# Patient Record
Sex: Female | Born: 1935 | Race: White | Hispanic: No | Smoking: Never smoker
Health system: Southern US, Community
[De-identification: ages and names within clinical notes are randomized; demographics above are authoritative.]

## PROBLEM LIST (undated history)

## (undated) DIAGNOSIS — I619 Nontraumatic intracerebral hemorrhage, unspecified: Secondary | ICD-10-CM

## (undated) DIAGNOSIS — Z87898 Personal history of other specified conditions: Secondary | ICD-10-CM

## (undated) DIAGNOSIS — I472 Ventricular tachycardia, unspecified: Secondary | ICD-10-CM

## (undated) DIAGNOSIS — Z8619 Personal history of other infectious and parasitic diseases: Secondary | ICD-10-CM

## (undated) DIAGNOSIS — Z91013 Allergy to seafood: Secondary | ICD-10-CM

## (undated) DIAGNOSIS — I5022 Chronic systolic (congestive) heart failure: Secondary | ICD-10-CM

## (undated) DIAGNOSIS — M4800 Spinal stenosis, site unspecified: Secondary | ICD-10-CM

## (undated) DIAGNOSIS — Z8679 Personal history of other diseases of the circulatory system: Secondary | ICD-10-CM

## (undated) DIAGNOSIS — I10 Essential (primary) hypertension: Secondary | ICD-10-CM

## (undated) DIAGNOSIS — S62101A Fracture of unspecified carpal bone, right wrist, initial encounter for closed fracture: Secondary | ICD-10-CM

## (undated) DIAGNOSIS — M1990 Unspecified osteoarthritis, unspecified site: Secondary | ICD-10-CM

## (undated) DIAGNOSIS — K279 Peptic ulcer, site unspecified, unspecified as acute or chronic, without hemorrhage or perforation: Secondary | ICD-10-CM

## (undated) DIAGNOSIS — G4762 Sleep related leg cramps: Secondary | ICD-10-CM

## (undated) DIAGNOSIS — F32A Depression, unspecified: Secondary | ICD-10-CM

## (undated) DIAGNOSIS — S329XXA Fracture of unspecified parts of lumbosacral spine and pelvis, initial encounter for closed fracture: Secondary | ICD-10-CM

## (undated) DIAGNOSIS — N289 Disorder of kidney and ureter, unspecified: Secondary | ICD-10-CM

## (undated) DIAGNOSIS — G8929 Other chronic pain: Secondary | ICD-10-CM

## (undated) DIAGNOSIS — S92909A Unspecified fracture of unspecified foot, initial encounter for closed fracture: Secondary | ICD-10-CM

## (undated) DIAGNOSIS — M810 Age-related osteoporosis without current pathological fracture: Secondary | ICD-10-CM

## (undated) DIAGNOSIS — F329 Major depressive disorder, single episode, unspecified: Secondary | ICD-10-CM

## (undated) DIAGNOSIS — T7840XA Allergy, unspecified, initial encounter: Secondary | ICD-10-CM

## (undated) DIAGNOSIS — M549 Dorsalgia, unspecified: Secondary | ICD-10-CM

## (undated) HISTORY — DX: Essential (primary) hypertension: I10

## (undated) HISTORY — DX: Fracture of unspecified parts of lumbosacral spine and pelvis, initial encounter for closed fracture: S32.9XXA

## (undated) HISTORY — PX: BREAST LUMPECTOMY: SHX2

## (undated) HISTORY — DX: Major depressive disorder, single episode, unspecified: F32.9

## (undated) HISTORY — DX: Sleep related leg cramps: G47.62

## (undated) HISTORY — DX: Peptic ulcer, site unspecified, unspecified as acute or chronic, without hemorrhage or perforation: K27.9

## (undated) HISTORY — PX: BACK SURGERY: SHX140

## (undated) HISTORY — DX: Allergy, unspecified, initial encounter: T78.40XA

## (undated) HISTORY — DX: Unspecified fracture of unspecified foot, initial encounter for closed fracture: S92.909A

## (undated) HISTORY — DX: Personal history of other specified conditions: Z87.898

## (undated) HISTORY — PX: WISDOM TOOTH EXTRACTION: SHX21

## (undated) HISTORY — DX: Unspecified osteoarthritis, unspecified site: M19.90

## (undated) HISTORY — DX: Depression, unspecified: F32.A

## (undated) HISTORY — DX: Personal history of other infectious and parasitic diseases: Z86.19

## (undated) HISTORY — DX: Allergy to seafood: Z91.013

## (undated) HISTORY — DX: Fracture of unspecified carpal bone, right wrist, initial encounter for closed fracture: S62.101A

## (undated) HISTORY — DX: Personal history of other diseases of the circulatory system: Z86.79

## (undated) HISTORY — DX: Age-related osteoporosis without current pathological fracture: M81.0

## (undated) HISTORY — DX: Spinal stenosis, site unspecified: M48.00

---

## 1997-11-20 DIAGNOSIS — Z8679 Personal history of other diseases of the circulatory system: Secondary | ICD-10-CM

## 1997-11-20 HISTORY — DX: Personal history of other diseases of the circulatory system: Z86.79

## 2004-08-05 ENCOUNTER — Ambulatory Visit: Payer: Self-pay

## 2004-11-07 ENCOUNTER — Emergency Department: Payer: Self-pay | Admitting: Internal Medicine

## 2005-05-07 ENCOUNTER — Ambulatory Visit: Payer: Self-pay | Admitting: Internal Medicine

## 2006-05-27 ENCOUNTER — Ambulatory Visit: Payer: Self-pay | Admitting: Internal Medicine

## 2006-12-04 ENCOUNTER — Inpatient Hospital Stay: Payer: Self-pay | Admitting: Cardiology

## 2006-12-17 ENCOUNTER — Ambulatory Visit: Payer: Self-pay | Admitting: Internal Medicine

## 2006-12-18 ENCOUNTER — Ambulatory Visit: Payer: Self-pay | Admitting: Gastroenterology

## 2006-12-24 ENCOUNTER — Ambulatory Visit: Payer: Self-pay | Admitting: Gastroenterology

## 2007-02-02 ENCOUNTER — Ambulatory Visit: Payer: Self-pay | Admitting: Pain Medicine

## 2007-02-08 ENCOUNTER — Ambulatory Visit: Payer: Self-pay | Admitting: Pain Medicine

## 2007-03-11 ENCOUNTER — Ambulatory Visit: Payer: Self-pay | Admitting: Pain Medicine

## 2007-04-13 ENCOUNTER — Ambulatory Visit: Payer: Self-pay | Admitting: Pain Medicine

## 2007-04-19 ENCOUNTER — Ambulatory Visit: Payer: Self-pay | Admitting: Pain Medicine

## 2007-05-27 ENCOUNTER — Ambulatory Visit: Payer: Self-pay | Admitting: Pain Medicine

## 2007-05-31 ENCOUNTER — Ambulatory Visit: Payer: Self-pay | Admitting: Internal Medicine

## 2007-06-21 ENCOUNTER — Ambulatory Visit: Payer: Self-pay | Admitting: Pain Medicine

## 2007-07-29 ENCOUNTER — Ambulatory Visit: Payer: Self-pay | Admitting: Pain Medicine

## 2007-08-02 ENCOUNTER — Ambulatory Visit: Payer: Self-pay | Admitting: Pain Medicine

## 2007-09-07 ENCOUNTER — Ambulatory Visit: Payer: Self-pay | Admitting: Unknown Physician Specialty

## 2007-09-14 ENCOUNTER — Ambulatory Visit: Payer: Self-pay | Admitting: Pain Medicine

## 2007-09-27 ENCOUNTER — Ambulatory Visit: Payer: Self-pay | Admitting: Pain Medicine

## 2007-10-09 ENCOUNTER — Ambulatory Visit: Payer: Self-pay | Admitting: Pain Medicine

## 2007-10-13 ENCOUNTER — Ambulatory Visit: Payer: Self-pay | Admitting: Pain Medicine

## 2007-11-23 ENCOUNTER — Ambulatory Visit: Payer: Self-pay | Admitting: Pain Medicine

## 2008-01-04 ENCOUNTER — Ambulatory Visit: Payer: Self-pay | Admitting: Pain Medicine

## 2008-02-29 ENCOUNTER — Ambulatory Visit: Payer: Self-pay | Admitting: Pain Medicine

## 2008-03-06 ENCOUNTER — Ambulatory Visit: Payer: Self-pay | Admitting: Pain Medicine

## 2008-04-04 ENCOUNTER — Ambulatory Visit: Payer: Self-pay | Admitting: Pain Medicine

## 2008-04-12 ENCOUNTER — Ambulatory Visit: Payer: Self-pay | Admitting: Pain Medicine

## 2008-05-04 ENCOUNTER — Ambulatory Visit: Payer: Self-pay | Admitting: Pain Medicine

## 2008-05-31 ENCOUNTER — Ambulatory Visit: Payer: Self-pay | Admitting: Internal Medicine

## 2008-06-06 ENCOUNTER — Ambulatory Visit: Payer: Self-pay | Admitting: Pain Medicine

## 2008-07-03 ENCOUNTER — Ambulatory Visit: Payer: Self-pay | Admitting: Pain Medicine

## 2008-07-20 ENCOUNTER — Ambulatory Visit: Payer: Self-pay | Admitting: Ophthalmology

## 2008-07-25 ENCOUNTER — Ambulatory Visit: Payer: Self-pay | Admitting: Pain Medicine

## 2008-07-31 ENCOUNTER — Ambulatory Visit: Payer: Self-pay | Admitting: Ophthalmology

## 2008-08-30 ENCOUNTER — Ambulatory Visit: Payer: Self-pay | Admitting: Pain Medicine

## 2008-09-28 ENCOUNTER — Ambulatory Visit: Payer: Self-pay | Admitting: Pain Medicine

## 2008-10-02 ENCOUNTER — Ambulatory Visit: Payer: Self-pay | Admitting: Pain Medicine

## 2008-10-31 ENCOUNTER — Ambulatory Visit: Payer: Self-pay | Admitting: Pain Medicine

## 2008-11-06 ENCOUNTER — Ambulatory Visit: Payer: Self-pay | Admitting: Unknown Physician Specialty

## 2008-11-08 ENCOUNTER — Encounter: Payer: Self-pay | Admitting: Internal Medicine

## 2008-11-20 ENCOUNTER — Encounter: Payer: Self-pay | Admitting: Internal Medicine

## 2008-12-05 ENCOUNTER — Ambulatory Visit: Payer: Self-pay | Admitting: Pain Medicine

## 2008-12-13 ENCOUNTER — Ambulatory Visit: Payer: Self-pay | Admitting: Pain Medicine

## 2009-01-02 ENCOUNTER — Ambulatory Visit: Payer: Self-pay | Admitting: Pain Medicine

## 2009-02-06 ENCOUNTER — Ambulatory Visit: Payer: Self-pay | Admitting: Pain Medicine

## 2009-03-06 ENCOUNTER — Ambulatory Visit: Payer: Self-pay | Admitting: Pain Medicine

## 2009-03-14 ENCOUNTER — Ambulatory Visit: Payer: Self-pay | Admitting: Pain Medicine

## 2009-04-17 ENCOUNTER — Ambulatory Visit: Payer: Self-pay | Admitting: Pain Medicine

## 2009-04-25 ENCOUNTER — Ambulatory Visit: Payer: Self-pay | Admitting: Pain Medicine

## 2009-05-15 ENCOUNTER — Ambulatory Visit: Payer: Self-pay | Admitting: Pain Medicine

## 2009-06-04 ENCOUNTER — Ambulatory Visit: Payer: Self-pay | Admitting: Internal Medicine

## 2009-06-14 ENCOUNTER — Ambulatory Visit: Payer: Self-pay | Admitting: Pain Medicine

## 2009-06-27 ENCOUNTER — Ambulatory Visit: Payer: Self-pay | Admitting: Pain Medicine

## 2009-07-10 ENCOUNTER — Ambulatory Visit: Payer: Self-pay | Admitting: Pain Medicine

## 2009-07-30 ENCOUNTER — Ambulatory Visit: Payer: Self-pay | Admitting: Pain Medicine

## 2009-09-04 ENCOUNTER — Ambulatory Visit: Payer: Self-pay | Admitting: Pain Medicine

## 2009-10-18 ENCOUNTER — Ambulatory Visit: Payer: Self-pay | Admitting: Pain Medicine

## 2009-10-22 ENCOUNTER — Ambulatory Visit: Payer: Self-pay | Admitting: Pain Medicine

## 2009-11-20 ENCOUNTER — Ambulatory Visit: Payer: Self-pay | Admitting: Pain Medicine

## 2009-12-24 ENCOUNTER — Ambulatory Visit: Payer: Self-pay | Admitting: Pain Medicine

## 2010-01-24 ENCOUNTER — Ambulatory Visit: Payer: Self-pay | Admitting: Pain Medicine

## 2010-02-03 ENCOUNTER — Encounter: Admission: RE | Admit: 2010-02-03 | Discharge: 2010-02-03 | Payer: Self-pay | Admitting: Neurosurgery

## 2010-02-26 ENCOUNTER — Ambulatory Visit: Payer: Self-pay | Admitting: Pain Medicine

## 2010-02-28 ENCOUNTER — Inpatient Hospital Stay (HOSPITAL_COMMUNITY): Admission: RE | Admit: 2010-02-28 | Discharge: 2010-03-02 | Payer: Self-pay | Admitting: Neurosurgery

## 2010-03-28 ENCOUNTER — Ambulatory Visit: Payer: Self-pay | Admitting: Pain Medicine

## 2010-04-25 ENCOUNTER — Ambulatory Visit: Payer: Self-pay | Admitting: Pain Medicine

## 2010-04-29 ENCOUNTER — Ambulatory Visit: Payer: Self-pay | Admitting: Pain Medicine

## 2010-05-28 ENCOUNTER — Ambulatory Visit: Payer: Self-pay | Admitting: Pain Medicine

## 2010-06-05 ENCOUNTER — Ambulatory Visit: Payer: Self-pay | Admitting: Pain Medicine

## 2010-06-05 ENCOUNTER — Ambulatory Visit: Payer: Self-pay | Admitting: Internal Medicine

## 2010-06-07 ENCOUNTER — Ambulatory Visit: Payer: Self-pay | Admitting: Pain Medicine

## 2010-06-25 ENCOUNTER — Ambulatory Visit: Payer: Self-pay | Admitting: Pain Medicine

## 2010-07-25 ENCOUNTER — Ambulatory Visit: Payer: Self-pay | Admitting: Pain Medicine

## 2010-07-31 ENCOUNTER — Ambulatory Visit: Payer: Self-pay | Admitting: Pain Medicine

## 2010-08-22 ENCOUNTER — Ambulatory Visit: Payer: Self-pay | Admitting: Pain Medicine

## 2010-09-25 ENCOUNTER — Ambulatory Visit: Payer: Self-pay | Admitting: Pain Medicine

## 2010-10-24 ENCOUNTER — Ambulatory Visit: Payer: Self-pay | Admitting: Pain Medicine

## 2010-10-28 ENCOUNTER — Ambulatory Visit: Payer: Self-pay | Admitting: Pain Medicine

## 2010-11-26 ENCOUNTER — Ambulatory Visit: Payer: Self-pay | Admitting: Pain Medicine

## 2010-12-03 ENCOUNTER — Other Ambulatory Visit: Payer: Self-pay | Admitting: Neurosurgery

## 2010-12-03 DIAGNOSIS — M549 Dorsalgia, unspecified: Secondary | ICD-10-CM

## 2010-12-03 DIAGNOSIS — M479 Spondylosis, unspecified: Secondary | ICD-10-CM

## 2010-12-07 ENCOUNTER — Ambulatory Visit
Admission: RE | Admit: 2010-12-07 | Discharge: 2010-12-07 | Disposition: A | Discharge status: Home / Self Care | Payer: Medicare Other | Source: Ambulatory Visit | Attending: Neurosurgery | Admitting: Neurosurgery

## 2010-12-07 DIAGNOSIS — M479 Spondylosis, unspecified: Secondary | ICD-10-CM

## 2010-12-07 DIAGNOSIS — M549 Dorsalgia, unspecified: Secondary | ICD-10-CM

## 2010-12-09 LAB — COMPREHENSIVE METABOLIC PANEL
ALT: 17 U/L (ref 0–35)
CO2: 26 mEq/L (ref 19–32)
Calcium: 9 mg/dL (ref 8.4–10.5)
Creatinine, Ser: 1.48 mg/dL — ABNORMAL HIGH (ref 0.4–1.2)
Glucose, Bld: 84 mg/dL (ref 70–99)
Potassium: 4.1 mEq/L (ref 3.5–5.1)
Sodium: 135 mEq/L (ref 135–145)
Total Bilirubin: 0.4 mg/dL (ref 0.3–1.2)
Total Protein: 6.6 g/dL (ref 6.0–8.3)

## 2010-12-09 LAB — URINE MICROSCOPIC-ADD ON

## 2010-12-09 LAB — CBC
Platelets: 247 10*3/uL (ref 150–400)
RDW: 16.8 % — ABNORMAL HIGH (ref 11.5–15.5)

## 2010-12-09 LAB — URINALYSIS, ROUTINE W REFLEX MICROSCOPIC
Bilirubin Urine: NEGATIVE
Nitrite: NEGATIVE
Protein, ur: NEGATIVE mg/dL
Urobilinogen, UA: 0.2 mg/dL (ref 0.0–1.0)

## 2010-12-09 LAB — PROTIME-INR
INR: 1.05 (ref 0.00–1.49)
Prothrombin Time: 13.6 seconds (ref 11.6–15.2)

## 2010-12-24 ENCOUNTER — Ambulatory Visit: Payer: Self-pay | Admitting: Pain Medicine

## 2011-01-01 ENCOUNTER — Ambulatory Visit: Payer: Self-pay | Admitting: Pain Medicine

## 2011-01-28 ENCOUNTER — Ambulatory Visit: Payer: Self-pay | Admitting: Pain Medicine

## 2011-02-10 ENCOUNTER — Ambulatory Visit: Payer: Self-pay | Admitting: Pain Medicine

## 2011-03-12 ENCOUNTER — Ambulatory Visit: Payer: Self-pay | Admitting: Ophthalmology

## 2011-03-13 ENCOUNTER — Ambulatory Visit: Payer: Self-pay | Admitting: Pain Medicine

## 2011-03-24 ENCOUNTER — Ambulatory Visit: Payer: Self-pay | Admitting: Ophthalmology

## 2011-04-15 ENCOUNTER — Ambulatory Visit: Payer: Self-pay | Admitting: Pain Medicine

## 2011-05-20 ENCOUNTER — Ambulatory Visit: Payer: Self-pay | Admitting: Pain Medicine

## 2011-06-09 ENCOUNTER — Ambulatory Visit: Payer: Self-pay | Admitting: Pain Medicine

## 2011-06-26 ENCOUNTER — Ambulatory Visit: Payer: Self-pay | Admitting: Internal Medicine

## 2011-07-15 ENCOUNTER — Other Ambulatory Visit: Payer: Self-pay | Admitting: Neurosurgery

## 2011-07-15 DIAGNOSIS — M479 Spondylosis, unspecified: Secondary | ICD-10-CM

## 2011-07-17 ENCOUNTER — Ambulatory Visit: Payer: Self-pay | Admitting: Pain Medicine

## 2011-07-23 ENCOUNTER — Other Ambulatory Visit: Payer: Self-pay | Admitting: Neurosurgery

## 2011-07-23 ENCOUNTER — Ambulatory Visit
Admission: RE | Admit: 2011-07-23 | Discharge: 2011-07-23 | Disposition: A | Discharge status: Home / Self Care | Payer: Medicare Other | Source: Ambulatory Visit | Attending: Neurosurgery | Admitting: Neurosurgery

## 2011-07-23 DIAGNOSIS — M479 Spondylosis, unspecified: Secondary | ICD-10-CM

## 2011-08-27 ENCOUNTER — Ambulatory Visit: Payer: Self-pay | Admitting: Pain Medicine

## 2011-09-25 ENCOUNTER — Ambulatory Visit: Payer: Self-pay | Admitting: Pain Medicine

## 2011-10-06 ENCOUNTER — Ambulatory Visit: Payer: Self-pay | Admitting: Pain Medicine

## 2011-10-23 ENCOUNTER — Ambulatory Visit: Payer: Self-pay | Admitting: Pain Medicine

## 2011-11-26 ENCOUNTER — Ambulatory Visit: Payer: Self-pay | Admitting: Pain Medicine

## 2011-12-30 ENCOUNTER — Ambulatory Visit: Payer: Self-pay | Admitting: Pain Medicine

## 2012-01-12 ENCOUNTER — Ambulatory Visit: Payer: Self-pay | Admitting: Pain Medicine

## 2012-02-10 ENCOUNTER — Ambulatory Visit: Payer: Self-pay | Admitting: Pain Medicine

## 2012-02-25 ENCOUNTER — Ambulatory Visit: Payer: Self-pay | Admitting: Pain Medicine

## 2012-03-30 ENCOUNTER — Ambulatory Visit: Payer: Self-pay | Admitting: Pain Medicine

## 2012-04-27 ENCOUNTER — Ambulatory Visit: Payer: Self-pay | Admitting: Pain Medicine

## 2012-05-21 ENCOUNTER — Emergency Department: Payer: Self-pay | Admitting: Emergency Medicine

## 2012-05-21 LAB — URINALYSIS, COMPLETE
Bilirubin,UR: NEGATIVE
Hyaline Cast: 6
Ketone: NEGATIVE
Ph: 5 (ref 4.5–8.0)
Protein: 30
RBC,UR: NONE SEEN /HPF (ref 0–5)
Specific Gravity: 1.024 (ref 1.003–1.030)
WBC UR: 472 /HPF (ref 0–5)

## 2012-05-21 LAB — COMPREHENSIVE METABOLIC PANEL
Anion Gap: 10 (ref 7–16)
Bilirubin,Total: 0.6 mg/dL (ref 0.2–1.0)
Chloride: 101 mmol/L (ref 98–107)
Co2: 26 mmol/L (ref 21–32)
EGFR (African American): 39 — ABNORMAL LOW
Osmolality: 281 (ref 275–301)
Potassium: 3.8 mmol/L (ref 3.5–5.1)
Sodium: 137 mmol/L (ref 136–145)
Total Protein: 8 g/dL (ref 6.4–8.2)

## 2012-05-21 LAB — TROPONIN I: Troponin-I: <0.02 ng/mL

## 2012-05-21 LAB — CBC
HGB: 10.8 g/dL — ABNORMAL LOW (ref 12.0–16.0)
MCH: 33.2 pg (ref 26.0–34.0)
MCHC: 33.6 g/dL (ref 32.0–36.0)
RBC: 3.26 10*6/uL — ABNORMAL LOW (ref 3.80–5.20)
RDW: 15.7 % — ABNORMAL HIGH (ref 11.5–14.5)
WBC: 13.5 10*3/uL — ABNORMAL HIGH (ref 3.6–11.0)

## 2012-06-10 ENCOUNTER — Ambulatory Visit: Payer: Self-pay | Admitting: Pain Medicine

## 2012-06-14 ENCOUNTER — Ambulatory Visit: Payer: Self-pay | Admitting: Pain Medicine

## 2012-06-29 ENCOUNTER — Ambulatory Visit: Payer: Self-pay | Admitting: Internal Medicine

## 2012-07-07 ENCOUNTER — Encounter: Payer: Self-pay | Admitting: Neurology

## 2012-07-08 ENCOUNTER — Ambulatory Visit: Payer: Self-pay | Admitting: Pain Medicine

## 2012-07-23 ENCOUNTER — Encounter: Payer: Self-pay | Admitting: Neurology

## 2012-08-10 ENCOUNTER — Ambulatory Visit: Payer: Self-pay | Admitting: Pain Medicine

## 2012-08-11 ENCOUNTER — Ambulatory Visit: Payer: Self-pay | Admitting: Pain Medicine

## 2012-09-07 ENCOUNTER — Ambulatory Visit: Payer: Self-pay | Admitting: Pain Medicine

## 2012-09-24 ENCOUNTER — Ambulatory Visit: Payer: Self-pay | Admitting: Pain Medicine

## 2012-09-27 ENCOUNTER — Ambulatory Visit: Payer: Self-pay | Admitting: Pain Medicine

## 2012-10-14 ENCOUNTER — Ambulatory Visit: Payer: Self-pay | Admitting: Pain Medicine

## 2012-11-16 ENCOUNTER — Ambulatory Visit: Payer: Self-pay | Admitting: Pain Medicine

## 2012-12-14 ENCOUNTER — Ambulatory Visit: Payer: Self-pay | Admitting: Pain Medicine

## 2013-01-05 ENCOUNTER — Ambulatory Visit: Payer: Self-pay | Admitting: Pain Medicine

## 2013-02-08 ENCOUNTER — Ambulatory Visit: Payer: Self-pay | Admitting: Pain Medicine

## 2013-03-21 ENCOUNTER — Ambulatory Visit: Payer: Self-pay | Admitting: Pain Medicine

## 2013-04-18 ENCOUNTER — Ambulatory Visit: Payer: Self-pay | Admitting: Pain Medicine

## 2013-05-17 ENCOUNTER — Ambulatory Visit: Payer: Self-pay | Admitting: Pain Medicine

## 2013-05-25 ENCOUNTER — Ambulatory Visit: Payer: Self-pay | Admitting: Pain Medicine

## 2013-06-16 ENCOUNTER — Ambulatory Visit: Payer: Self-pay | Admitting: Pain Medicine

## 2013-07-19 ENCOUNTER — Ambulatory Visit: Payer: Self-pay | Admitting: Pain Medicine

## 2013-08-23 ENCOUNTER — Ambulatory Visit: Payer: Self-pay | Admitting: Orthopedic Surgery

## 2013-08-25 ENCOUNTER — Ambulatory Visit: Payer: Self-pay | Admitting: Orthopedic Surgery

## 2013-08-25 HISTORY — PX: FRACTURE SURGERY: SHX138

## 2013-09-07 ENCOUNTER — Ambulatory Visit: Payer: Self-pay | Admitting: Pain Medicine

## 2013-10-04 ENCOUNTER — Ambulatory Visit: Payer: Self-pay | Admitting: Family Medicine

## 2013-10-05 ENCOUNTER — Ambulatory Visit: Payer: Self-pay | Admitting: Pain Medicine

## 2013-10-10 ENCOUNTER — Ambulatory Visit: Payer: Self-pay | Admitting: Pain Medicine

## 2013-10-31 ENCOUNTER — Emergency Department: Payer: Self-pay | Admitting: Emergency Medicine

## 2013-10-31 LAB — CBC WITH DIFFERENTIAL/PLATELET
Basophil #: 0.1 10*3/uL (ref 0.0–0.1)
Basophil %: 0.5 %
EOS ABS: 0 10*3/uL (ref 0.0–0.7)
Eosinophil %: 0.3 %
HCT: 33.9 % — AB (ref 35.0–47.0)
HGB: 11.4 g/dL — ABNORMAL LOW (ref 12.0–16.0)
LYMPHS ABS: 0.9 10*3/uL — AB (ref 1.0–3.6)
Lymphocyte %: 7.1 %
MCH: 31 pg (ref 26.0–34.0)
MCHC: 33.5 g/dL (ref 32.0–36.0)
MCV: 92 fL (ref 80–100)
MONOS PCT: 5.1 %
Monocyte #: 0.6 x10 3/mm (ref 0.2–0.9)
NEUTROS PCT: 87 %
Neutrophil #: 10.4 10*3/uL — ABNORMAL HIGH (ref 1.4–6.5)
Platelet: 218 10*3/uL (ref 150–440)
RBC: 3.68 10*6/uL — ABNORMAL LOW (ref 3.80–5.20)
RDW: 16.5 % — ABNORMAL HIGH (ref 11.5–14.5)
WBC: 12 10*3/uL — AB (ref 3.6–11.0)

## 2013-10-31 LAB — URINALYSIS, COMPLETE
BILIRUBIN, UR: NEGATIVE
Blood: NEGATIVE
GLUCOSE, UR: NEGATIVE mg/dL (ref 0–75)
Hyaline Cast: 1
Nitrite: NEGATIVE
PH: 5 (ref 4.5–8.0)
PROTEIN: NEGATIVE
RBC,UR: 2 /HPF (ref 0–5)
Specific Gravity: 1.017 (ref 1.003–1.030)
Squamous Epithelial: 5
WBC UR: 37 /HPF (ref 0–5)

## 2013-10-31 LAB — COMPREHENSIVE METABOLIC PANEL
ALBUMIN: 3.2 g/dL — AB (ref 3.4–5.0)
AST: 48 U/L — AB (ref 15–37)
Alkaline Phosphatase: 64 U/L
Anion Gap: 5 — ABNORMAL LOW (ref 7–16)
BUN: 30 mg/dL — AB (ref 7–18)
Bilirubin,Total: 0.4 mg/dL (ref 0.2–1.0)
CHLORIDE: 103 mmol/L (ref 98–107)
Calcium, Total: 9.1 mg/dL (ref 8.5–10.1)
Co2: 22 mmol/L (ref 21–32)
Creatinine: 1.43 mg/dL — ABNORMAL HIGH (ref 0.60–1.30)
EGFR (African American): 39 — ABNORMAL LOW
EGFR (Non-African Amer.): 34 — ABNORMAL LOW
Glucose: 118 mg/dL — ABNORMAL HIGH (ref 65–99)
OSMOLALITY: 268 (ref 275–301)
Potassium: 4.4 mmol/L (ref 3.5–5.1)
SGPT (ALT): 16 U/L (ref 12–78)
Sodium: 130 mmol/L — ABNORMAL LOW (ref 136–145)
TOTAL PROTEIN: 7.5 g/dL (ref 6.4–8.2)

## 2013-10-31 LAB — TROPONIN I: Troponin-I: <0.02 ng/mL

## 2013-10-31 LAB — LIPASE, BLOOD: Lipase: 395 U/L — ABNORMAL HIGH (ref 73–393)

## 2013-11-16 ENCOUNTER — Ambulatory Visit: Payer: Self-pay | Admitting: Pain Medicine

## 2013-12-20 ENCOUNTER — Ambulatory Visit: Payer: Self-pay | Admitting: Pain Medicine

## 2014-01-17 ENCOUNTER — Ambulatory Visit: Payer: Self-pay | Admitting: Pain Medicine

## 2014-01-31 ENCOUNTER — Other Ambulatory Visit: Payer: Self-pay | Admitting: Neurosurgery

## 2014-01-31 DIAGNOSIS — M5136 Other intervertebral disc degeneration, lumbar region: Secondary | ICD-10-CM

## 2014-02-08 ENCOUNTER — Ambulatory Visit
Admission: RE | Admit: 2014-02-08 | Discharge: 2014-02-08 | Disposition: A | Discharge status: Home / Self Care | Payer: Medicare Other | Source: Ambulatory Visit | Attending: Neurosurgery | Admitting: Neurosurgery

## 2014-02-08 VITALS — BP 132/57 | HR 61

## 2014-02-08 DIAGNOSIS — M5136 Other intervertebral disc degeneration, lumbar region: Secondary | ICD-10-CM

## 2014-02-08 MED ORDER — DIAZEPAM 5 MG PO TABS
5.0000 mg | ORAL_TABLET | Freq: Once | ORAL | Status: AC
  Administered 2014-02-08: 5 mg via ORAL

## 2014-02-08 MED ORDER — IOHEXOL 180 MG/ML  SOLN
15.0000 mL | Freq: Once | INTRAMUSCULAR | Status: AC | PRN
  Administered 2014-02-08: 15 mL via INTRATHECAL

## 2014-02-08 NOTE — Discharge Instructions (Signed)
Myelogram Discharge Instructions  1. Go home and rest quietly for the next 24 hours.  It is important to lie flat for the next 24 hours.  Get up only to go to the restroom.  You may lie in the bed or on a couch on your back, your stomach, your left side or your right side.  You may have one pillow under your head.  You may have pillows between your knees while you are on your side or under your knees while you are on your back.  2. DO NOT drive today.  Recline the seat as far back as it will go, while still wearing your seat belt, on the way home.  3. You may get up to go to the bathroom as needed.  You may sit up for 10 minutes to eat.  You may resume your normal diet and medications unless otherwise indicated.  Drink lots of extra fluids today and tomorrow.  4. The incidence of headache, nausea, or vomiting is about 5% (one in 20 patients).  If you develop a headache, lie flat and drink plenty of fluids until the headache goes away.  Caffeinated beverages may be helpful.  If you develop severe nausea and vomiting or a headache that does not go away with flat bed rest, call [phone].  5. You may resume normal activities after your 24 hours of bed rest is over; however, do not exert yourself strongly or do any heavy lifting tomorrow. If when you get up you have a headache when standing, go back to bed and force fluids for another 24 hours.  6. Call your physician for a follow-up appointment.  The results of your myelogram will be sent directly to your physician by the following day.  7. If you have any questions or if complications develop after you arrive home, please call [phone].  Discharge instructions have been explained to the patient.  The patient, or the person responsible for the patient, fully understands these instructions.      May resume Citalopram on Feb 09, 2014, after 1:00 pm.

## 2014-02-15 ENCOUNTER — Ambulatory Visit: Payer: Self-pay | Admitting: Pain Medicine

## 2014-02-27 ENCOUNTER — Encounter: Payer: Self-pay | Admitting: Neurosurgery

## 2014-03-01 ENCOUNTER — Ambulatory Visit: Payer: Self-pay | Admitting: Pain Medicine

## 2014-03-21 ENCOUNTER — Ambulatory Visit: Payer: Self-pay | Admitting: Pain Medicine

## 2014-03-22 ENCOUNTER — Encounter: Payer: Self-pay | Admitting: Neurosurgery

## 2014-04-18 ENCOUNTER — Ambulatory Visit: Payer: Self-pay | Admitting: Pain Medicine

## 2014-04-24 ENCOUNTER — Ambulatory Visit: Payer: Self-pay | Admitting: Pain Medicine

## 2014-05-23 ENCOUNTER — Ambulatory Visit: Payer: Self-pay | Admitting: Pain Medicine

## 2014-06-05 ENCOUNTER — Ambulatory Visit: Payer: Self-pay | Admitting: Pain Medicine

## 2014-06-19 ENCOUNTER — Ambulatory Visit: Payer: Self-pay | Admitting: Pain Medicine

## 2014-07-18 ENCOUNTER — Ambulatory Visit: Payer: Self-pay | Admitting: Pain Medicine

## 2014-08-02 ENCOUNTER — Ambulatory Visit: Payer: Self-pay | Admitting: Pain Medicine

## 2014-08-06 ENCOUNTER — Emergency Department: Payer: Self-pay | Admitting: Emergency Medicine

## 2014-08-22 ENCOUNTER — Ambulatory Visit: Payer: Self-pay | Admitting: Pain Medicine

## 2014-08-23 ENCOUNTER — Ambulatory Visit: Payer: Self-pay | Admitting: Pain Medicine

## 2014-09-12 ENCOUNTER — Ambulatory Visit: Payer: Self-pay | Admitting: Pain Medicine

## 2014-09-25 ENCOUNTER — Ambulatory Visit: Payer: Self-pay | Admitting: Pain Medicine

## 2014-10-16 ENCOUNTER — Ambulatory Visit: Payer: Self-pay | Admitting: Pain Medicine

## 2014-10-23 ENCOUNTER — Ambulatory Visit: Payer: Self-pay | Admitting: Pain Medicine

## 2014-11-14 ENCOUNTER — Ambulatory Visit: Payer: Self-pay | Admitting: Pain Medicine

## 2014-11-27 ENCOUNTER — Ambulatory Visit: Payer: Self-pay | Admitting: Pain Medicine

## 2014-12-11 ENCOUNTER — Ambulatory Visit: Payer: Self-pay | Admitting: Pain Medicine

## 2015-01-12 NOTE — Op Note (Signed)
PATIENT NAME:  Lisa [name], Lisa [name] MR#:  [id number] DATE OF BIRTH:  1927-07-25  DATE OF PROCEDURE:  08/25/2013  PREOPERATIVE DIAGNOSIS: Displaced left distal radius fracture.   POSTOPERATIVE DIAGNOSIS: Displaced left distal radius fracture.   PROCEDURE: Open reduction and internal fixation, left distal radius.   ANESTHESIA: General.   SURGEON: Leitha SchullerMichael J. Geneieve Duell, M.D.   DESCRIPTION OF PROCEDURE: The patient was brought to the operating room and after adequate anesthesia was obtained, the left arm was prepped and draped in the usual sterile fashion with a tourniquet applied to the upper arm. After patient identification and timeout procedures were completed, fingertrap traction was applied, and the tourniquet was raised to 250 mmHg. An incision was made over the FCR tendon. The tendon sheath was incised and the tendon retracted radially. The deep fascia incised and then the pronator muscle elevated off the distal fragment and the fracture exposed. With traction, length was restored with dorsal pressure on the distal fragment. Alignment was near anatomic. A short narrow DVR plate was then applied to the volar wrist and 3 cortical screws placed, first the slotted screw and then adjustment of the plate to be at the appropriate level for good fixation distally. After the 3 proximal screws had been placed, drilling, measuring and then placing the screws. The pegs were filled with the wrist in a reduced position, drilling, measuring and placing the smooth pegs. After all peg holes had been filled, the traction was released and on examination under fluoroscopy, the fracture appeared stable with restoration of length, radial inclination and most of the volar tilt. Tourniquet was let down at this time. The wound irrigated and then closed with 3-0 Vicryl subcutaneously and 4-0 nylon for the skin. Xeroform, 4 x 4's, Webril and a volar splint were applied followed by an Ace wrap.   TOURNIQUET TIME: 20 minutes at 250 mmHg.    COMPLICATIONS: None.    SPECIMEN: None.    IMPLANT: [employer] DVR volar plate with multiple pegs and screws.   ____________________________ Leitha SchullerMichael J. Camaron Cammack, MD mjm:gb D: 08/25/2013 20:47:26 ET T: 08/25/2013 22:28:36 ET JOB#: [id number]  cc: Leitha SchullerMichael J. Mina Babula, MD, <Dictator> Leitha SchullerMICHAEL J Stefani Baik MD ELECTRONICALLY SIGNED 08/26/2013 1:36

## 2015-01-16 ENCOUNTER — Ambulatory Visit
Admit: 2015-01-16 | Disposition: A | Discharge status: Home / Self Care | Payer: Self-pay | Attending: Pain Medicine | Admitting: Pain Medicine

## 2015-02-15 ENCOUNTER — Ambulatory Visit: Payer: Medicare Other | Attending: Pain Medicine | Admitting: Pain Medicine

## 2015-02-15 ENCOUNTER — Encounter: Payer: Self-pay | Admitting: Pain Medicine

## 2015-02-15 VITALS — BP 143/61 | HR 74 | Temp 98.2°F | Resp 16 | Ht 63.0 in | Wt 145.0 lb

## 2015-02-15 DIAGNOSIS — M5136 Other intervertebral disc degeneration, lumbar region: Secondary | ICD-10-CM | POA: Insufficient documentation

## 2015-02-15 DIAGNOSIS — M5116 Intervertebral disc disorders with radiculopathy, lumbar region: Secondary | ICD-10-CM | POA: Insufficient documentation

## 2015-02-15 DIAGNOSIS — M79604 Pain in right leg: Secondary | ICD-10-CM | POA: Diagnosis present

## 2015-02-15 DIAGNOSIS — M47816 Spondylosis without myelopathy or radiculopathy, lumbar region: Secondary | ICD-10-CM | POA: Insufficient documentation

## 2015-02-15 DIAGNOSIS — M79605 Pain in left leg: Secondary | ICD-10-CM | POA: Diagnosis present

## 2015-02-15 DIAGNOSIS — M545 Low back pain: Secondary | ICD-10-CM | POA: Diagnosis present

## 2015-02-15 DIAGNOSIS — M4806 Spinal stenosis, lumbar region: Secondary | ICD-10-CM | POA: Insufficient documentation

## 2015-02-15 DIAGNOSIS — S8290XA Unspecified fracture of unspecified lower leg, initial encounter for closed fracture: Secondary | ICD-10-CM | POA: Insufficient documentation

## 2015-02-15 DIAGNOSIS — M961 Postlaminectomy syndrome, not elsewhere classified: Secondary | ICD-10-CM | POA: Insufficient documentation

## 2015-02-15 DIAGNOSIS — M533 Sacrococcygeal disorders, not elsewhere classified: Secondary | ICD-10-CM | POA: Insufficient documentation

## 2015-02-15 DIAGNOSIS — S8290XD Unspecified fracture of unspecified lower leg, subsequent encounter for closed fracture with routine healing: Secondary | ICD-10-CM

## 2015-02-15 MED ORDER — HYDROCODONE-ACETAMINOPHEN 5-325 MG PO TABS: ORAL_TABLET | ORAL | Status: DC

## 2015-02-15 NOTE — Progress Notes (Signed)
   Subjective:    Patient ID: Lisa [name], female    DOB: 06/17/1927, 81 y.o.   MRN: [id number]  HPI  Patient is a 81 year old female [facility] and treatment of pain involving the lower back lower extremity regions especially the left lower extremity region. Patient states that she has some pain occurring in the region of the buttocks which increases with prolonged standing and walking. Patient is planning to go on trips in the near future and states that she would like to have procedure prior to her trip. We will continue medications as prescribed and will await patient to call to inform us of when she wishes to undergo interventional treatment to decrease severity of symptoms minimize the risk of medication escalation hopefully reduce progression of symptoms and hopefully  avoid the need for more involved treatment The patient was with understanding and in agreement with the suggested treatment plan    Review of Systems     Objective:   Physical Exam  Tenderness to palpation of the splenius capitis and occipitalis muscles.  There was mild tenderness of the acromioclavicular and glenohumeral joint region. Palpation of the cervical and thoracic facet region reproduced mild discomfort. Palpation over the acid region was with mild muscle spasms. With no crepitus of the thoracic region haven't been noted. Palpation of the lumbar facet lumbar paraspinal musculature region reproduced mild discomfort on the right with moderate discomfort on the left. Palpation of the gluteal and piriformis muscles reproduced moderate to severe discomfort on the left straight leg raising was tolerates approximately 20 with decreased EHL strength and questions he decreased sensation of the S1 dermatomal distribution detected. Outpatient of the PSIS and PII S regions reproduced moderate discomfort as well. There was negative clonus and negative Homans. Mild tenderness of the  greater trochanteric region noted. Abdomen nontender with no costovertebral angle tenderness noted.               Assessment & Plan:    Degenerative disc disease lumbar spine Disc material and narrowing of the foramen on the left with central stenosis L3-4 central disc protrusion and facet and ligamentum flavum hypertrophy 5 facet mediated postsurgical anterior listhesis with annular disc bulging right greater than the left with L V nerve impingement, L5-S1 severe disc space narrowing, osseous spurring extends to both sides worse on the right with facet hypertrophy, stenosis 1-2 and L2-3 disc bulge with leftward protrusion partially calcified moderate disc space narrowing worse on the left with advanced facet arthropathy and ligamentum flavum hypertrophy  Lumbar radiculopathy  Lumbar facet syndrome  Sacroiliac joint dysfunction  Status post fracture of lower extremity    Plan  Continue present medications.  F/U PCP for evaliation of  BP and general medical  condition.  F/U surgical evaluation.  F/U neurological evaluation.  May consider radiofrequency rhizolysis or intraspinal procedures pending response to present treatment and F/U evaluation.  Patient to call [facility] should patient have concerns prior to scheduled return appointment.

## 2015-02-15 NOTE — Progress Notes (Signed)
Discharged to home ambulatory with script for Norco.  [phone]

## 2015-02-15 NOTE — Patient Instructions (Signed)
Continue present medications.  F/U PCP for evaliation of  BP and general medical  condition.  F/U surgical evaluation.  F/U neurological evaluation.  May consider radiofrequency rhizolysis or intraspinal procedures pending response to present treatment and F/U evaluation.  Patient to call [facility] should patient have concerns prior to scheduled return appointment.  

## 2015-03-20 ENCOUNTER — Ambulatory Visit: Payer: Medicare Other | Attending: Pain Medicine | Admitting: Pain Medicine

## 2015-03-20 ENCOUNTER — Encounter: Payer: Self-pay | Admitting: Pain Medicine

## 2015-03-20 VITALS — BP 138/66 | HR 74 | Temp 98.1°F | Resp 18 | Ht 63.0 in | Wt 145.0 lb

## 2015-03-20 DIAGNOSIS — M5126 Other intervertebral disc displacement, lumbar region: Secondary | ICD-10-CM | POA: Diagnosis not present

## 2015-03-20 DIAGNOSIS — M5416 Radiculopathy, lumbar region: Secondary | ICD-10-CM

## 2015-03-20 DIAGNOSIS — M961 Postlaminectomy syndrome, not elsewhere classified: Secondary | ICD-10-CM

## 2015-03-20 DIAGNOSIS — S8290XD Unspecified fracture of unspecified lower leg, subsequent encounter for closed fracture with routine healing: Secondary | ICD-10-CM

## 2015-03-20 DIAGNOSIS — M47816 Spondylosis without myelopathy or radiculopathy, lumbar region: Secondary | ICD-10-CM

## 2015-03-20 DIAGNOSIS — M5136 Other intervertebral disc degeneration, lumbar region: Secondary | ICD-10-CM | POA: Diagnosis not present

## 2015-03-20 DIAGNOSIS — M533 Sacrococcygeal disorders, not elsewhere classified: Secondary | ICD-10-CM

## 2015-03-20 DIAGNOSIS — M4806 Spinal stenosis, lumbar region: Secondary | ICD-10-CM | POA: Insufficient documentation

## 2015-03-20 DIAGNOSIS — M79604 Pain in right leg: Secondary | ICD-10-CM | POA: Diagnosis present

## 2015-03-20 DIAGNOSIS — M545 Low back pain: Secondary | ICD-10-CM | POA: Diagnosis present

## 2015-03-20 DIAGNOSIS — M79605 Pain in left leg: Secondary | ICD-10-CM | POA: Diagnosis present

## 2015-03-20 MED ORDER — HYDROCODONE-ACETAMINOPHEN 5-325 MG PO TABS: ORAL_TABLET | ORAL | Status: DC

## 2015-03-20 NOTE — Patient Instructions (Addendum)
Continue present medications hydrocodone acetaminophen  Lumbar epidural steroid injection Monday, 04/01/2012  F/U PCP for evaliation of  BP and general medical  condition.  F/U surgical evaluation.  F/U neurological evaluation.  May consider radiofrequency rhizolysis or intraspinal procedures pending response to present treatment and F/U evaluation.  Patient to call [facility] should patient have concerns prior to scheduled return appointment. Epidural Steroid Injection Patient Information  Description: The epidural space surrounds the nerves as they exit the spinal cord.  In some patients, the nerves can be compressed and inflamed by a bulging disc or a tight spinal canal (spinal stenosis).  By injecting steroids into the epidural space, we can bring irritated nerves into direct contact with a potentially helpful medication.  These steroids act directly on the irritated nerves and can reduce swelling and inflammation which often leads to decreased pain.  Epidural steroids may be injected anywhere along the spine and from the neck to the low back depending upon the location of your pain.   After numbing the skin with local anesthetic (like Novocaine), a small needle is passed into the epidural space slowly.  You may experience a sensation of pressure while this is being done.  The entire block usually last less than 10 minutes.  Conditions which may be treated by epidural steroids:   Low back and leg pain  Neck and arm pain  Spinal stenosis  Post-laminectomy syndrome  Herpes zoster (shingles) pain  Pain from compression fractures  Preparation for the injection:  1. Do not eat any solid food or dairy products within 6 hours of your appointment.  2. You may drink clear liquids up to 2 hours before appointment.  Clear liquids include water, black coffee, juice or soda.  No milk or cream please. 3. You may take your regular medication, including pain medications, with a  sip of water before your appointment  Diabetics should hold regular insulin (if taken separately) and take 1/2 normal NPH dos the morning of the procedure.  Carry some sugar containing items with you to your appointment. 4. A driver must accompany you and be prepared to drive you home after your procedure.  5. Bring all your current medications with your. 6. An IV may be inserted and sedation may be given at the discretion of the physician.   7. A blood pressure cuff, EKG and other monitors will often be applied during the procedure.  Some patients may need to have extra oxygen administered for a short period. 8. You will be asked to provide medical information, including your allergies, prior to the procedure.  We must know immediately if you are taking blood thinners (like Coumadin/Warfarin)  Or if you are allergic to IV iodine contrast (dye). We must know if you could possible be pregnant.  Possible side-effects:  Bleeding from needle site  Infection (rare, may require surgery)  Nerve injury (rare)  Numbness & tingling (temporary)  Difficulty urinating (rare, temporary)  Spinal headache ( a headache worse with upright posture)  Light -headedness (temporary)  Pain at injection site (several days)  Decreased blood pressure (temporary)  Weakness in arm/leg (temporary)  Pressure sensation in back/neck (temporary)  Call if you experience:  Fever/chills associated with headache or increased back/neck pain.  Headache worsened by an upright position.  New onset weakness or numbness of an extremity below the injection site  Hives or difficulty breathing (go to the emergency room)  Inflammation or drainage at the infection site  Severe back/neck pain  Any new symptoms which are concerning to you  Please note:  Although the local anesthetic injected can often make your back or neck feel good for several hours after the injection, the pain will likely return.  It takes 3-7  days for steroids to work in the epidural space.  You may not notice any pain relief for at least that one week.  If effective, we will often do a series of three injections spaced 3-6 weeks apart to maximally decrease your pain.  After the initial series, we generally will wait several months before considering a repeat injection of the same type.  If you have any questions, please call [phone] [facility]

## 2015-03-20 NOTE — Progress Notes (Signed)
Safety precautions to be maintained throughout the outpatient stay will include: orient to surroundings, keep bed in low position, maintain call bell within reach at all times, provide assistance with transfer out of bed and ambulation.  

## 2015-03-20 NOTE — Progress Notes (Signed)
   Subjective:    Patient ID: Lisa [name], female    DOB: 03/31/1927, 81 y.o.   MRN: [id number]  HPI Patient is a 81 year old female [facility] for further evaluation and treatment of lower back lower extremity pain. Patient states that his been return of pain radiating from the lower back lower extremity on the left. He said the pain is associated with numbness and some weakness. Prolonged standing and walking. We discussed patient undergoing follow-up evaluation with Dr.Botero M.D. proceed with interventional treatment lumbar epidural steroid injection at time return appointment to decrease severity of symptoms, minimize progression of symptoms, and hopefully avoid the need for more involved treatment. The patient was understanding and in agreement with suggested treatment plan.     Review of Systems     Objective:   Physical Exam  Mild tends to palpation of the splenius capitis and occipitalis musculature regions. There was unremarkable Spurling's maneuver. Tenderness to palpation over the cervical facet and thoracic facet and paraspinal musculature region was with minimal tends to palpation. Palpation of the thoracic facet thoracic paraspinal musculature region the lower thoracic region on the left was a tends to palpation of mild to moderate degree and mild to moderate spasms. Tinel and Phalen's maneuver were without increased pain with significant degree and patient appeared to be with bilaterally equal grip strength. Palpation over the lumbar paraspinal musculature region lumbar facet region was a tends to palpation moderate to moderately severe degree left greater than right. Straight leg raising was tolerates approximately 20 without increased pain with dorsiflexion noted. There was questionable decreased sensation of the S1 dermatomal distribution. There was negative clonus negative Homans. Minimal tenderness of the greater trochanteric region iliotibial band region.  Abdomen was nontender with no costovertebral angle tenderness noted      Assessment & Plan:    Degenerative disc disease lumbar spine L1-2 L2-3 disc bulge with leftward protrusion partially calcified moderate disc space narrowing worse on the left with advanced facet arthropathy and ligamentum flavum hypertrophy L3-4 central disc protrusion with facet and ligament flavum hypertrophy and central canal stenosis, L4-5 facet mediated postsurgical anterior listhesis, annular disc bulging, right greater than left L4-L5 nerve root impingement, L5-S1 severe disc space narrowing with facet arthropathy hypertrophy and central protrusion.  Lumbar stenosis with neurogenic claudication     Plan   Continue present medications hydrocodone acetaminophen  Lumbar epidural steroid injection to be performed at time return appointment  F/U PCP [facility] for evaliation of  BP and general medical  condition.  F/U surgical evaluation with Dr. Jeral FruitBotero as discussed  F/U neurological evaluation  May consider radiofrequency rhizolysis or intraspinal procedures pending response to present treatment and F/U evaluation.  Patient to call [facility] should patient have concerns prior to scheduled return appointment.

## 2015-04-02 ENCOUNTER — Ambulatory Visit: Payer: Medicare Other | Attending: Pain Medicine | Admitting: Pain Medicine

## 2015-04-02 VITALS — BP 126/41 | HR 54 | Temp 96.6°F | Resp 14 | Ht 63.0 in | Wt 148.0 lb

## 2015-04-02 DIAGNOSIS — M47816 Spondylosis without myelopathy or radiculopathy, lumbar region: Secondary | ICD-10-CM

## 2015-04-02 DIAGNOSIS — M4806 Spinal stenosis, lumbar region: Secondary | ICD-10-CM | POA: Diagnosis not present

## 2015-04-02 DIAGNOSIS — M4316 Spondylolisthesis, lumbar region: Secondary | ICD-10-CM | POA: Insufficient documentation

## 2015-04-02 DIAGNOSIS — M48062 Spinal stenosis, lumbar region with neurogenic claudication: Secondary | ICD-10-CM | POA: Insufficient documentation

## 2015-04-02 DIAGNOSIS — S8290XD Unspecified fracture of unspecified lower leg, subsequent encounter for closed fracture with routine healing: Secondary | ICD-10-CM

## 2015-04-02 DIAGNOSIS — M961 Postlaminectomy syndrome, not elsewhere classified: Secondary | ICD-10-CM

## 2015-04-02 DIAGNOSIS — M5136 Other intervertebral disc degeneration, lumbar region: Secondary | ICD-10-CM

## 2015-04-02 DIAGNOSIS — M533 Sacrococcygeal disorders, not elsewhere classified: Secondary | ICD-10-CM

## 2015-04-02 DIAGNOSIS — M79604 Pain in right leg: Secondary | ICD-10-CM | POA: Diagnosis present

## 2015-04-02 DIAGNOSIS — M79605 Pain in left leg: Secondary | ICD-10-CM | POA: Diagnosis present

## 2015-04-02 DIAGNOSIS — M545 Low back pain: Secondary | ICD-10-CM | POA: Diagnosis present

## 2015-04-02 MED ORDER — MIDAZOLAM HCL 5 MG/5ML IJ SOLN
INTRAMUSCULAR | Status: AC
  Administered 2015-04-02: 2 mg via INTRAVENOUS
  Filled 2015-04-02: qty 5

## 2015-04-02 MED ORDER — FENTANYL CITRATE (PF) 100 MCG/2ML IJ SOLN
INTRAMUSCULAR | Status: AC
  Administered 2015-04-02: 50 ug via INTRAVENOUS
  Filled 2015-04-02: qty 2

## 2015-04-02 MED ORDER — LIDOCAINE HCL (PF) 1 % IJ SOLN
INTRAMUSCULAR | Status: AC
  Administered 2015-04-02: 12:00:00
  Filled 2015-04-02: qty 5

## 2015-04-02 MED ORDER — ORPHENADRINE CITRATE 30 MG/ML IJ SOLN
INTRAMUSCULAR | Status: AC
  Administered 2015-04-02: 12:00:00
  Filled 2015-04-02: qty 2

## 2015-04-02 MED ORDER — TRIAMCINOLONE ACETONIDE 40 MG/ML IJ SUSP
INTRAMUSCULAR | Status: AC
  Administered 2015-04-02: 12:00:00
  Filled 2015-04-02: qty 1

## 2015-04-02 MED ORDER — BUPIVACAINE HCL (PF) 0.25 % IJ SOLN
INTRAMUSCULAR | Status: AC
  Administered 2015-04-02: 12:00:00
  Filled 2015-04-02: qty 30

## 2015-04-02 MED ORDER — SODIUM CHLORIDE 0.9 % IJ SOLN
INTRAMUSCULAR | Status: AC
  Administered 2015-04-02: 12:00:00
  Filled 2015-04-02: qty 20

## 2015-04-02 NOTE — Progress Notes (Signed)
Safety precautions to be maintained throughout the outpatient stay will include: orient to surroundings, keep bed in low position, maintain call bell within reach at all times, provide assistance with transfer out of bed and ambulation.  

## 2015-04-02 NOTE — Patient Instructions (Addendum)
Continue present medications hydrocodone acetaminophen   F/U PCP for evaliation of  BP and general medical  condition.  F/U surgical evaluation with Dr. Jeral Fruit as discussed  F/U neurological evaluation  May consider radiofrequency rhizolysis or intraspinal procedures pending response to present treatment and F/U evaluation.  Patient to call [facility] should patient have concerns prior to scheduled return appointment. Pain Management Discharge Instructions  General Discharge Instructions :  If you need to reach your doctor call: Monday-Friday 8:00 am - 4:00 pm at [phone] or toll free [phone].  After clinic hours [phone] to have operator reach doctor.  Bring all of your medication bottles to all your appointments in the pain clinic.  To cancel or reschedule your appointment with Pain Management please remember to call 24 hours in advance to avoid a fee.  Refer to the educational materials which you have been given on: General Risks, I had my Procedure. Discharge Instructions, Post Sedation.  Post Procedure Instructions:  The drugs you were given will stay in your system until tomorrow, so for the next 24 hours you should not drive, make any legal decisions or drink any alcoholic beverages.  You may eat anything you prefer, but it is better to start with liquids then soups and crackers, and gradually work up to solid foods.  Please notify your doctor immediately if you have any unusual bleeding, trouble breathing or pain that is not related to your normal pain.  Depending on the type of procedure that was done, some parts of your body may feel week and/or numb.  This usually clears up by tonight or the next day.  Walk with the use of an assistive device or accompanied by an adult for the 24 hours.  You may use ice on the affected area for the first 24 hours.  Put ice in a Ziploc bag and cover with a towel and place against area 15 minutes on 15 minutes  off.  You may switch to heat after 24 hours.Epidural Steroid Injection Patient Information  Description: The epidural space surrounds the nerves as they exit the spinal cord.  In some patients, the nerves can be compressed and inflamed by a bulging disc or a tight spinal canal (spinal stenosis).  By injecting steroids into the epidural space, we can bring irritated nerves into direct contact with a potentially helpful medication.  These steroids act directly on the irritated nerves and can reduce swelling and inflammation which often leads to decreased pain.  Epidural steroids may be injected anywhere along the spine and from the neck to the low back depending upon the location of your pain.   After numbing the skin with local anesthetic (like Novocaine), a small needle is passed into the epidural space slowly.  You may experience a sensation of pressure while this is being done.  The entire block usually last less than 10 minutes.  Conditions which may be treated by epidural steroids:   Low back and leg pain  Neck and arm pain  Spinal stenosis  Post-laminectomy syndrome  Herpes zoster (shingles) pain  Pain from compression fractures  Preparation for the injection:  1. Do not eat any solid food or dairy products within 6 hours of your appointment.  2. You may drink clear liquids up to 2 hours before appointment.  Clear liquids include water, black coffee, juice or soda.  No milk or cream please. 3. You may take your regular medication, including pain medications, with a sip of water before your  appointment  Diabetics should hold regular insulin (if taken separately) and take 1/2 normal NPH dos the morning of the procedure.  Carry some sugar containing items with you to your appointment. 4. A driver must accompany you and be prepared to drive you home after your procedure.  5. Bring all your current medications with your. 6. An IV may be inserted and sedation may be given at the discretion  of the physician.   7. A blood pressure cuff, EKG and other monitors will often be applied during the procedure.  Some patients may need to have extra oxygen administered for a short period. 8. You will be asked to provide medical information, including your allergies, prior to the procedure.  We must know immediately if you are taking blood thinners (like Coumadin/Warfarin)  Or if you are allergic to IV iodine contrast (dye). We must know if you could possible be pregnant.  Possible side-effects:  Bleeding from needle site  Infection (rare, may require surgery)  Nerve injury (rare)  Numbness & tingling (temporary)  Difficulty urinating (rare, temporary)  Spinal headache ( a headache worse with upright posture)  Light -headedness (temporary)  Pain at injection site (several days)  Decreased blood pressure (temporary)  Weakness in arm/leg (temporary)  Pressure sensation in back/neck (temporary)  Call if you experience:  Fever/chills associated with headache or increased back/neck pain.  Headache worsened by an upright position.  New onset weakness or numbness of an extremity below the injection site  Hives or difficulty breathing (go to the emergency room)  Inflammation or drainage at the infection site  Severe back/neck pain  Any new symptoms which are concerning to you  Please note:  Although the local anesthetic injected can often make your back or neck feel good for several hours after the injection, the pain will likely return.  It takes 3-7 days for steroids to work in the epidural space.  You may not notice any pain relief for at least that one week.  If effective, we will often do a series of three injections spaced 3-6 weeks apart to maximally decrease your pain.  After the initial series, we generally will wait several months before considering a repeat injection of the same type.  If you have any questions, please call [phone] [facility]

## 2015-04-02 NOTE — Progress Notes (Signed)
   Subjective:    Patient ID: Lisa [name], female    DOB: 06/16/1927, 81 y.o.   MRN: [id number]  HPI  PROCEDURE PERFORMED: Lumbar epidural steroid injection   NOTE: The patient is a 81 y.o. female who [facility] for further evaluation and treatment of pain involving the lumbar and lower extremity region. MRI postoperative and degenerative changes of the lumbar spine with mild central stenosis left greater than the right L2-3 degenerative changes L4-5 facet mediated postsurgical anterior listhesis, annular disc bulging, L5-S1 severe disc space narrowing, osseous spurring extends to both sides worse on the right and facet arthropathy, facet hypertrophy, and central protrusion. The risks, benefits, and expectations of the procedure have been discussed and explained to the patient who was understanding and in agreement with suggested treatment plan. We will proceed with lumbar epidural steroid injection as discussed and as explained to the patient who is willing to proceed with procedure as planned.   DESCRIPTION OF PROCEDURE: Lumbar epidural steroid injection with IV Versed, IV fentanyl conscious sedation, EKG, blood pressure, pulse, and pulse oximetry monitoring. The procedure was performed with the patient in the prone position under fluoroscopic guidance. A local anesthetic skin wheal of 1.5% plain lidocaine was accomplished at proposed entry site. An 18-gauge Tuohy epidural needle was inserted at the L 4 vertebral body level left of the midline via loss-of-resistance technique with negative heme and negative CSF return. A total of 4 mL of Preservative-Free normal saline with 40 mg of Kenalog injected incrementally via epidurally placed needle. Needle was removed.    A total of 40 mg of Kenalog was utilized for the procedure.   The patient tolerated the injection well.    PLAN:   1. Medications: We will continue presently prescribed medications hydrocodone  acetaminophen 2. Will consider modification of treatment regimen pending response to treatment rendered on today's visit and follow-up evaluation. 3. The patient is to follow-up with primary care physician Dr. Larwance SachsBabaoff regarding blood pressure and general medical condition status post lumbar epidural steroid injection performed on today's visit. 4. Surgical evaluation . Patient will follow-up Dr.Botero of neurosurgical reevaluation 5. Neurological evaluation. 6. The patient may be a candidate for radiofrequency procedures, implantation device, and other treatment pending response to treatment and follow-up evaluation. 7. The patient has been advised to adhere to proper body mechanics and avoid activities which appear to aggravate condition. 8. The patient has been advised to call the [facility] prior to scheduled return appointment should there be significant change in condition or should there be sign  The patient is understanding and agrees with the suggested  treatment plan      Review of Systems     Objective:   Physical Exam        Assessment & Plan:

## 2015-04-03 ENCOUNTER — Telehealth: Payer: Self-pay | Admitting: *Deleted

## 2015-04-03 NOTE — Telephone Encounter (Signed)
Denies complications post procedure. 

## 2015-04-17 ENCOUNTER — Ambulatory Visit: Payer: Medicare Other | Attending: Pain Medicine | Admitting: Pain Medicine

## 2015-04-17 ENCOUNTER — Encounter: Payer: Self-pay | Admitting: Pain Medicine

## 2015-04-17 VITALS — BP 131/50 | HR 76 | Temp 98.1°F | Resp 18 | Ht 63.0 in

## 2015-04-17 DIAGNOSIS — M79604 Pain in right leg: Secondary | ICD-10-CM | POA: Diagnosis present

## 2015-04-17 DIAGNOSIS — M5126 Other intervertebral disc displacement, lumbar region: Secondary | ICD-10-CM | POA: Diagnosis not present

## 2015-04-17 DIAGNOSIS — M533 Sacrococcygeal disorders, not elsewhere classified: Secondary | ICD-10-CM | POA: Insufficient documentation

## 2015-04-17 DIAGNOSIS — M5136 Other intervertebral disc degeneration, lumbar region: Secondary | ICD-10-CM | POA: Diagnosis not present

## 2015-04-17 DIAGNOSIS — M4806 Spinal stenosis, lumbar region: Secondary | ICD-10-CM | POA: Diagnosis not present

## 2015-04-17 DIAGNOSIS — M4316 Spondylolisthesis, lumbar region: Secondary | ICD-10-CM | POA: Insufficient documentation

## 2015-04-17 DIAGNOSIS — M961 Postlaminectomy syndrome, not elsewhere classified: Secondary | ICD-10-CM

## 2015-04-17 DIAGNOSIS — M545 Low back pain: Secondary | ICD-10-CM | POA: Diagnosis present

## 2015-04-17 DIAGNOSIS — M47816 Spondylosis without myelopathy or radiculopathy, lumbar region: Secondary | ICD-10-CM

## 2015-04-17 DIAGNOSIS — M5416 Radiculopathy, lumbar region: Secondary | ICD-10-CM | POA: Insufficient documentation

## 2015-04-17 DIAGNOSIS — S8290XE Unspecified fracture of unspecified lower leg, subsequent encounter for open fracture type I or II with routine healing: Secondary | ICD-10-CM

## 2015-04-17 DIAGNOSIS — M48062 Spinal stenosis, lumbar region with neurogenic claudication: Secondary | ICD-10-CM

## 2015-04-17 DIAGNOSIS — M79605 Pain in left leg: Secondary | ICD-10-CM | POA: Diagnosis present

## 2015-04-17 MED ORDER — HYDROCODONE-ACETAMINOPHEN 5-325 MG PO TABS: ORAL_TABLET | ORAL | Status: DC

## 2015-04-17 NOTE — Patient Instructions (Addendum)
Continue present medication hydrocodone acetaminophen  Lumbar facet, medial branch nerve, blocks to be performed Monday, 04/23/2015 detected  F/U PCP Dr.Babaoff  for evaliation of  BP and general medical  condition.  F/U surgical evaluation Dr. Jeral Fruit as discussed  F/U neurological evaluation  May consider radiofrequency rhizolysis or intraspinal procedures pending response to present treatment and F/U evaluation.  Patient to call [facility] should patient have concerns prior to scheduled return appointment. Facet Blocks Patient Information  Description: The facets are joints in the spine between the vertebrae.  Like any joints in the body, facets can become irritated and painful.  Arthritis can also effect the facets.  By injecting steroids and local anesthetic in and around these joints, we can temporarily block the nerve supply to them.  Steroids act directly on irritated nerves and tissues to reduce selling and inflammation which often leads to decreased pain.  Facet blocks may be done anywhere along the spine from the neck to the low back depending upon the location of your pain.   After numbing the skin with local anesthetic (like Novocaine), a small needle is passed onto the facet joints under x-ray guidance.  You may experience a sensation of pressure while this is being done.  The entire block usually lasts about 15-25 minutes.   Conditions which may be treated by facet blocks:   Low back/buttock pain  Neck/shoulder pain  Certain types of headaches  Preparation for the injection:  1. Do not eat any solid food or dairy products within 6 hours of your appointment. 2. You may drink clear liquid up to 2 hours before appointment.  Clear liquids include water, black coffee, juice or soda.  No milk or cream please. 3. You may take your regular medication, including pain medications, with a sip of water before your appointment.  Diabetics should hold regular insulin (if  taken separately) and take 1/2 normal NPH dose the morning of the procedure.  Carry some sugar containing items with you to your appointment. 4. A driver must accompany you and be prepared to drive you home after your procedure. 5. Bring all your current medications with you. 6. An IV may be inserted and sedation may be given at the discretion of the physician. 7. A blood pressure cuff, EKG and other monitors will often be applied during the procedure.  Some patients may need to have extra oxygen administered for a short period. 8. You will be asked to provide medical information, including your allergies and medications, prior to the procedure.  We must know immediately if you are taking blood thinners (like Coumadin/Warfarin) or if you are allergic to IV iodine contrast (dye).  We must know if you could possible be pregnant.  Possible side-effects:   Bleeding from needle site  Infection (rare, may require surgery)  Nerve injury (rare)  Numbness & tingling (temporary)  Difficulty urinating (rare, temporary)  Spinal headache (a headache worse with upright posture)  Light-headedness (temporary)  Pain at injection site (serveral days)  Decreased blood pressure (rare, temporary)  Weakness in arm/leg (temporary)  Pressure sensation in back/neck (temporary)   Call if you experience:   Fever/chills associated with headache or increased back/neck pain  Headache worsened by an upright position  New onset, weakness or numbness of an extremity below the injection site  Hives or difficulty breathing (go to the emergency room)  Inflammation or drainage at the injection site(s)  Severe back/neck pain greater than usual  New symptoms which are concerning  to you  Please note:  Although the local anesthetic injected can often make your back or neck feel good for several hours after the injection, the pain will likely return. It takes 3-7 days for steroids to work.  You may not  notice any pain relief for at least one week.  If effective, we will often do a series of 2-3 injections spaced 3-6 weeks apart to maximally decrease your pain.  After the initial series, you may be a candidate for a more permanent nerve block of the facets.  If you have any questions, please call #[phone] [facility]

## 2015-04-17 NOTE — Progress Notes (Signed)
Safety precautions to be maintained throughout the outpatient stay will include: orient to surroundings, keep bed in low position, maintain call bell within reach at all times, provide assistance with transfer out of bed and ambulation.  

## 2015-04-17 NOTE — Progress Notes (Signed)
   Subjective:    Patient ID: Lisa [name], female    DOB: [birth year], 81 y.o.   MRN: [id number]  HPI  Patient is a 81 year old female returns to [facility] for further evaluation and treatment of pain involving the lower back lower extremity region. Patient states that she has pain involving the lower back lower extremity region especially on the left. Patient states that twisting turning maneuvers of particular the painful. Improvement of the lower extremity weakness. Patient denies any recent trauma change in events of daily living the call significant change in symptomatology. Turning over in bed and twisting turning maneuvers have been particularly uncomfortable for the patient. We will proceed with lumbar facet, medial branch nerve, blocks at time return appointment in attempt to decrease severity of symptoms, minimize progression of symptoms, and avoid the need for more involved treatment. Patient is understanding and agreement with suggested treatment plan     Review of Systems     Objective:   Physical Exam  There was tenderness of the spinous And occipitalis muscles of minimal degree. Palpation cervical facet cervical paraspinal muscles reproduced minimal discomfort. There was unremarkable Spurling's maneuver. There was minimal tenderness of the thoracic facet thoracic paraspinal musculature region. Tinel and Phalen's maneuver were without increase of pain of significant degree. Palpation over the lumbar facets lumbar paraspinal muscles region reproduced moderate moderately severe discomfort. Lateral bending and rotation and extension to palpation of the lumbar facets reproducing severe discomfort. Straight leg raising tolerates approximately 30 without a definite increased pain with dorsiflexion noted. There was negative clonus negative Homans. With slightly decreased EHL strength on the left compared to the right. Mild tenderness of the greater trochanteric region iliotibial  band region. Abdomen was nontender and no costovertebral tenderness was noted.      Assessment & Plan:  Degenerative disc disease lumbar spine L1-2 and L2-3 disc bulge with leftward protrusion partially calcified moderate disc space narrowing, worse on the left, advanced facet arthropathy and ligamentum flavum hypertrophy, disc material and narrowing of the foramen on the left with mild central stenosis left greater than the right L2-3 nerve root impingement, L3-4 central protrusion and facet and ligamentum flavum hypertrophy and central canal stenosis, L4-5 facet mediated postsurgical anterior listhesis, annular disc bulging right greater than the left, L4-L5 nerve root impingement, L5-S1 disc space narrowing, osseous spurring extends to both sides worse on the right with facet hypertrophy arthropathy and central protrusion  Lumbar facet syndrome  Lumbar radiculopathy  Sacroiliac joint dysfunction    Plan  Continue present medications hydrocodone acetaminophen  Lumbar facet, medial branch nerve, blocks to be performed at time return appointment  F/U PCP Dr.Babaoff  for evaliation of  BP and general medical  condition.  F/U surgical evaluation with Dr.Botero as discussed  F/U neurological evaluation  May consider radiofrequency rhizolysis or intraspinal procedures pending response to present treatment and F/U evaluation.  Patient to call [facility] should patient have concerns prior to scheduled return appointment.

## 2015-04-23 ENCOUNTER — Ambulatory Visit: Payer: Medicare Other | Attending: Pain Medicine | Admitting: Pain Medicine

## 2015-04-23 ENCOUNTER — Encounter: Payer: Self-pay | Admitting: Pain Medicine

## 2015-04-23 VITALS — BP 118/75 | HR 62 | Temp 98.3°F | Resp 14 | Ht 63.0 in | Wt 148.0 lb

## 2015-04-23 DIAGNOSIS — M47816 Spondylosis without myelopathy or radiculopathy, lumbar region: Secondary | ICD-10-CM | POA: Diagnosis not present

## 2015-04-23 DIAGNOSIS — M1288 Other specific arthropathies, not elsewhere classified, other specified site: Secondary | ICD-10-CM | POA: Insufficient documentation

## 2015-04-23 DIAGNOSIS — M79605 Pain in left leg: Secondary | ICD-10-CM | POA: Diagnosis present

## 2015-04-23 DIAGNOSIS — M545 Low back pain: Secondary | ICD-10-CM | POA: Diagnosis present

## 2015-04-23 DIAGNOSIS — S8290XD Unspecified fracture of unspecified lower leg, subsequent encounter for closed fracture with routine healing: Secondary | ICD-10-CM

## 2015-04-23 DIAGNOSIS — M533 Sacrococcygeal disorders, not elsewhere classified: Secondary | ICD-10-CM

## 2015-04-23 DIAGNOSIS — M961 Postlaminectomy syndrome, not elsewhere classified: Secondary | ICD-10-CM

## 2015-04-23 DIAGNOSIS — M5126 Other intervertebral disc displacement, lumbar region: Secondary | ICD-10-CM | POA: Diagnosis not present

## 2015-04-23 DIAGNOSIS — M5136 Other intervertebral disc degeneration, lumbar region: Secondary | ICD-10-CM

## 2015-04-23 DIAGNOSIS — M48062 Spinal stenosis, lumbar region with neurogenic claudication: Secondary | ICD-10-CM

## 2015-04-23 DIAGNOSIS — M79604 Pain in right leg: Secondary | ICD-10-CM | POA: Diagnosis present

## 2015-04-23 MED ORDER — ORPHENADRINE CITRATE 30 MG/ML IJ SOLN
INTRAMUSCULAR | Status: AC
  Administered 2015-04-23: 09:00:00
  Filled 2015-04-23: qty 2

## 2015-04-23 MED ORDER — CEFAZOLIN SODIUM 1 G IJ SOLR
INTRAMUSCULAR | Status: AC
  Administered 2015-04-23: 09:00:00
  Filled 2015-04-23: qty 10

## 2015-04-23 MED ORDER — BUPIVACAINE HCL (PF) 0.25 % IJ SOLN
INTRAMUSCULAR | Status: AC
  Administered 2015-04-23: 09:00:00
  Filled 2015-04-23: qty 30

## 2015-04-23 MED ORDER — FENTANYL CITRATE (PF) 100 MCG/2ML IJ SOLN
INTRAMUSCULAR | Status: AC
  Filled 2015-04-23: qty 2

## 2015-04-23 MED ORDER — CEFUROXIME AXETIL 250 MG PO TABS: 250.0000 mg | ORAL_TABLET | Freq: Two times a day (BID) | ORAL | Status: DC

## 2015-04-23 MED ORDER — MIDAZOLAM HCL 5 MG/5ML IJ SOLN
INTRAMUSCULAR | Status: AC
  Filled 2015-04-23: qty 5

## 2015-04-23 MED ORDER — TRIAMCINOLONE ACETONIDE 40 MG/ML IJ SUSP
INTRAMUSCULAR | Status: AC
  Administered 2015-04-23: 09:00:00
  Filled 2015-04-23: qty 1

## 2015-04-23 NOTE — Progress Notes (Signed)
Subjective:    Patient ID: Lisa [name], female    DOB: [birth year], 81 y.o.   MRN: [id number]  HPI  PROCEDURE PERFORMED: Lumbar facet (medial branch block)   NOTE: The patient is a 81 y.o. female who returns to [facility] for further evaluation and treatment of pain involving the lumbar and lower extremity region. MRI  revealed the patient to be with evidence of degenerative changes lumbar spine L1 to, L2-3, disc bulge with leftward protrusion partially calcified moderate disc space narrowing worse on the left with advanced facet arthropathy and ligamentum flavum hypertrophy, L3-4 central disc protrusion with facet and ligament flavum hypertrophy and central canal stenosis L4-L5 facet mediated postsurgical anterior listhesis, annular disc bulging, right greater than the left L4-5, nerve root impingement, L5-S1 severe disc space narrowing with facet arthropathy, hypertrophy and central protrusion. The risks, benefits, and expectations of the procedure have been discussed and explained to the patient who was understanding and in agreement with suggested treatment plan. We will proceed with interventional treatment as discussed and as explained to the patient who was understanding and wished to proceed with procedure as planned.   DESCRIPTION OF PROCEDURE: Lumbar facet (medial branch block) with IV Versed, IV fentanyl conscious sedation, EKG, blood pressure, pulse, and pulse oximetry monitoring. The procedure was performed with the patient in the prone position. Betadine prep of proposed entry site performed.   NEEDLE PLACEMENT AT: Left L 3 lumbar facet (medial branch block). Under fluoroscopic guidance with oblique orientation of 15 degrees, a 22-gauge needle was inserted at the L 3 vertebral body level with needle placed at the targeted area of Burton's Eye or Eye of the Scotty Dog with documentation of needle placement in the superior and lateral border of targeted area of Burton's Eye or  Eye of the Scotty Dog with oblique orientation of 15 degrees. Following documentation of needle placement at the L 3 vertebral body level, needle placement was then accomplished at the L 4 vertebral body level.   NEEDLE PLACEMENT AT L4 and L5 VERTEBRAL BODY LEVELS ON THE LEFT SIDE The procedure was performed at the L4 and L5 vertebral body levels exactly as was performed at the L 3 vertebral body level utilizing the same technique and under fluoroscopic guidance.  NEEDLE PLACEMENT AT THE SACRAL ALA with AP view of the lumbosacral spine. With the patient in the prone position, Betadine prep of proposed entry site accomplished, a 22 gauge needle was inserted in the region of the sacral ala (groove formed by the superior articulating process of S1 and the sacral wing). Following documentation of needle placement at the sacral ala,  needle placement was then accomplished at the S1 foramen level.   NEEDLE PLACEMENT AT THE S1 FORAMEN LEVEL under fluoroscopic guidance with AP view of the lumbosacral spine and cephalad orientation of the fluoroscope, a 22-gauge needle was placed at the superior and lateral border of the S1 foramen under fluoroscopic guidance. Following documentation of needle placement at the S1 foramen.   Needle placement was then verified at all levels on lateral view. Following documentation of needle placement at all levels on lateral view and following negative aspiration for heme and CSF, each level was injected with 1 mL of 0.25% bupivacaine with Kenalog.     LUMBAR FACET, MEDIAL BRANCH NERVE, BLOCKS PERFORMED ON THE RIGHT SIDE   The procedure was performed on the right side exactly as was performed on the left side at the same levels and utilizing the  same technique under fluoroscopic guidance.     The patient tolerated the procedure well. A total of 40 mg of Kenalog was utilized for the procedure.   PLAN:  1. Medications: The patient will continue presently prescribed  medication hydrocodone acetaminophen. 2. May consider modification of treatment regimen at time of return appointment pending response to treatment rendered on today's visit. 3. The patient is to follow-up with primary care physician [facility]  for further evaluation of blood pressure and general medical condition status post steroid injection performed on today's visit. 4. Surgical follow-up evaluation with Dr. Jeral Fruit. 5. Neurological follow-up evaluation. 6. The patient may be candidate for radiofrequency procedures, implantation type procedures, and other treatment pending response to treatment and follow-up evaluation. 7. The patient has been advised to call the [facility] prior to scheduled return appointment should there be significant change in condition or should patient have other concerns regarding condition prior to scheduled return appointment.  The patient is understanding and in agreement with suggested treatment plan.   Review of Systems     Objective:   Physical Exam        Assessment & Plan:

## 2015-04-23 NOTE — Patient Instructions (Addendum)
Continue present medications hydrocodone acetaminophen. Please obtain your antibiotic Ceftin and begin taking Ceftin today  F/U PCP Dr. Larwance Sachs  for evaliation of  BP and general medical  condition.  F/U surgical evaluation Dr. Jeral Fruit  F/U neurological evaluation  May consider radiofrequency rhizolysis or intraspinal procedures pending response to present treatment and F/U evaluation.  Patient to call [facility] Center should patient have concerns prior to scheduled return appointment. Pain Management Discharge Instructions  General Discharge Instructions :  If you need to reach your doctor call: Monday-Friday 8:00 am - 4:00 pm at [phone] or toll free [phone].  After clinic hours [phone] to have operator reach doctor.  Bring all of your medication bottles to all your appointments in the pain clinic.  To cancel or reschedule your appointment with Pain Management please remember to call 24 hours in advance to avoid a fee.  Refer to the educational materials which you have been given on: General Risks, I had my Procedure. Discharge Instructions, Post Sedation.  Post Procedure Instructions:  The drugs you were given will stay in your system until tomorrow, so for the next 24 hours you should not drive, make any legal decisions or drink any alcoholic beverages.  You may eat anything you prefer, but it is better to start with liquids then soups and crackers, and gradually work up to solid foods.  Please notify your doctor immediately if you have any unusual bleeding, trouble breathing or pain that is not related to your normal pain.  Depending on the type of procedure that was done, some parts of your body may feel week and/or numb.  This usually clears up by tonight or the next day.  Walk with the use of an assistive device or accompanied by an adult for the 24 hours.  You may use ice on the affected area for the first 24 hours.  Put ice in a Ziploc bag and cover  with a towel and place against area 15 minutes on 15 minutes off.  You may switch to heat after 24 hours.Facet Joint Block The facet joints connect the bones of the spine (vertebrae). They make it possible for you to bend, twist, and make other movements with your spine. They also prevent you from overbending, overtwisting, and making other excessive movements.  A facet joint block is a procedure where a numbing medicine (anesthetic) is injected into a facet joint. Often, a type of anti-inflammatory medicine called a steroid is also injected. A facet joint block may be done for two reasons:   Diagnosis. A facet joint block may be done as a test to see whether neck or back pain is caused by a worn-down or infected facet joint. If the pain gets better after a facet joint block, it means the pain is probably coming from the facet joint. If the pain does not get better, it means the pain is probably not coming from the facet joint.   Therapy. A facet joint block may be done to relieve neck or back pain caused by a facet joint. A facet joint block is only done as a therapy if the pain does not improve with medicine, exercise programs, physical therapy, and other forms of pain management. LET [facility] CARE PROVIDER KNOW ABOUT:   Any allergies you have.   All medicines you are taking, including vitamins, herbs, eyedrops, and over-the-counter medicines and creams.   Previous problems you or members of your family have had with the use of anesthetics.  Any blood disorders you have had.   Other health problems you have. RISKS AND COMPLICATIONS Generally, having a facet joint block is safe. However, as with any procedure, complications can occur. Possible complications associated with having a facet joint block include:   Bleeding.   Injury to a nerve near the injection site.   Pain at the injection site.   Weakness or numbness in areas controlled by nerves near the injection site.    Infection.   Temporary fluid retention.   Allergic reaction to anesthetics or medicines used during the procedure. BEFORE THE PROCEDURE   Follow your health care provider's instructions if you are taking dietary supplements or medicines. You may need to stop taking them or reduce your dosage.   Do not take any new dietary supplements or medicines without asking your health care provider first.   Follow your health care provider's instructions about eating and drinking before the procedure. You may need to stop eating and drinking several hours before the procedure.   Arrange to have an adult drive you home after the procedure. PROCEDURE  You may need to remove your clothing and dress in an open-back gown so that your health care provider can access your spine.   The procedure will be done while you are lying on an X-ray table. Most of the time you will be asked to lie on your stomach, but you may be asked to lie in a different position if an injection will be made in your neck.   Special machines will be used to monitor your oxygen levels, heart rate, and blood pressure.   If an injection will be made in your neck, an intravenous (IV) tube will be inserted into one of your veins. Fluids and medicine will flow directly into your body through the IV tube.   The area over the facet joint where the injection will be made will be cleaned with an antiseptic soap. The surrounding skin will be covered with sterile drapes.   An anesthetic will be applied to your skin to make the injection area numb. You may feel a temporary stinging or burning sensation.   A video X-ray machine will be used to locate the joint. A contrast dye may be injected into the facet joint area to help with locating the joint.   When the joint is located, an anesthetic medicine will be injected into the joint through the needle.   Your health care provider will ask you whether you feel pain relief. If  you do feel relief, a steroid may be injected to provide pain relief for a longer period of time. If you do not feel relief or feel only partial relief, additional injections of an anesthetic may be made in other facet joints.   The needle will be removed, the skin will be cleansed, and bandages will be applied.  AFTER THE PROCEDURE   You will be observed for 15-30 minutes before being allowed to go home. Do not drive. Have an adult drive you or take a taxi or public transportation instead.   If you feel pain relief, the pain will return in several hours or days when the anesthetic wears off.   You may feel pain relief 2-14 days after the procedure. The amount of time this relief lasts varies from person to person.   It is normal to feel some tenderness over the injected area(s) for 2 days following the procedure.   If you have diabetes, you may have a  temporary increase in blood sugar. Document Released: 01/28/2007 Document Revised: 01/23/2014 Document Reviewed: 06/28/2012 [facility] Patient Information 2015 [place], [place]. This information is not intended to replace advice given to you by your health care provider. Make sure you discuss any questions you have with your health care provider.

## 2015-04-23 NOTE — Progress Notes (Signed)
Safety precautions to be maintained throughout the outpatient stay will include: orient to surroundings, keep bed in low position, maintain call bell within reach at all times, provide assistance with transfer out of bed and ambulation.  

## 2015-04-24 ENCOUNTER — Telehealth: Payer: Self-pay | Admitting: *Deleted

## 2015-04-24 NOTE — Telephone Encounter (Signed)
lvm

## 2015-05-03 ENCOUNTER — Other Ambulatory Visit: Payer: Self-pay | Admitting: Pain Medicine

## 2015-05-22 ENCOUNTER — Ambulatory Visit: Payer: Medicare Other | Attending: Pain Medicine | Admitting: Pain Medicine

## 2015-05-22 ENCOUNTER — Encounter: Payer: Self-pay | Admitting: Pain Medicine

## 2015-05-22 VITALS — BP 145/48 | HR 75 | Temp 98.4°F | Resp 18 | Ht 63.0 in | Wt 148.0 lb

## 2015-05-22 DIAGNOSIS — M961 Postlaminectomy syndrome, not elsewhere classified: Secondary | ICD-10-CM

## 2015-05-22 DIAGNOSIS — S8290XD Unspecified fracture of unspecified lower leg, subsequent encounter for closed fracture with routine healing: Secondary | ICD-10-CM

## 2015-05-22 DIAGNOSIS — M79604 Pain in right leg: Secondary | ICD-10-CM | POA: Diagnosis present

## 2015-05-22 DIAGNOSIS — M5136 Other intervertebral disc degeneration, lumbar region: Secondary | ICD-10-CM | POA: Insufficient documentation

## 2015-05-22 DIAGNOSIS — M47816 Spondylosis without myelopathy or radiculopathy, lumbar region: Secondary | ICD-10-CM

## 2015-05-22 DIAGNOSIS — M79605 Pain in left leg: Secondary | ICD-10-CM | POA: Diagnosis present

## 2015-05-22 DIAGNOSIS — M5126 Other intervertebral disc displacement, lumbar region: Secondary | ICD-10-CM | POA: Insufficient documentation

## 2015-05-22 DIAGNOSIS — M51369 Other intervertebral disc degeneration, lumbar region without mention of lumbar back pain or lower extremity pain: Secondary | ICD-10-CM

## 2015-05-22 DIAGNOSIS — M5416 Radiculopathy, lumbar region: Secondary | ICD-10-CM | POA: Insufficient documentation

## 2015-05-22 DIAGNOSIS — M533 Sacrococcygeal disorders, not elsewhere classified: Secondary | ICD-10-CM

## 2015-05-22 DIAGNOSIS — M545 Low back pain: Secondary | ICD-10-CM | POA: Diagnosis present

## 2015-05-22 DIAGNOSIS — M48062 Spinal stenosis, lumbar region with neurogenic claudication: Secondary | ICD-10-CM

## 2015-05-22 MED ORDER — HYDROCODONE-ACETAMINOPHEN 5-325 MG PO TABS: ORAL_TABLET | ORAL | Status: DC

## 2015-05-22 NOTE — Progress Notes (Signed)
Safety precautions to be maintained throughout the outpatient stay will include: orient to surroundings, keep bed in low position, maintain call bell within reach at all times, provide assistance with transfer out of bed and ambulation.  

## 2015-05-22 NOTE — Patient Instructions (Addendum)
Continue present medication hydrocodone acetaminophen  Lumbosacral selective nerve root block to be performed at time of return appointment  F/U PCP Dr.Babaoff  for evaliation of  BP and general medical  condition  F/U surgical evaluation. Follow-up with Dr. Jeral Fruit as needed  F/U neurological evaluation  May consider radiofrequency rhizolysis or intraspinal procedures pending response to present treatment and F/U evaluation   Patient to [facility] should patient have concerns prior to scheduled return appointment. Facet Joint Block The facet joints connect the bones of the spine (vertebrae). They make it possible for you to bend, twist, and make other movements with your spine. They also prevent you from overbending, overtwisting, and making other excessive movements.  A facet joint block is a procedure where a numbing medicine (anesthetic) is injected into a facet joint. Often, a type of anti-inflammatory medicine called a steroid is also injected. A facet joint block may be done for two reasons:   Diagnosis. A facet joint block may be done as a test to see whether neck or back pain is caused by a worn-down or infected facet joint. If the pain gets better after a facet joint block, it means the pain is probably coming from the facet joint. If the pain does not get better, it means the pain is probably not coming from the facet joint.   Therapy. A facet joint block may be done to relieve neck or back pain caused by a facet joint. A facet joint block is only done as a therapy if the pain does not improve with medicine, exercise programs, physical therapy, and other forms of pain management. LET [facility] CARE PROVIDER KNOW ABOUT:   Any allergies you have.   All medicines you are taking, including vitamins, herbs, eyedrops, and over-the-counter medicines and creams.   Previous problems you or members of your family have had with the use of anesthetics.   Any blood  disorders you have had.   Other health problems you have. RISKS AND COMPLICATIONS Generally, having a facet joint block is safe. However, as with any procedure, complications can occur. Possible complications associated with having a facet joint block include:   Bleeding.   Injury to a nerve near the injection site.   Pain at the injection site.   Weakness or numbness in areas controlled by nerves near the injection site.   Infection.   Temporary fluid retention.   Allergic reaction to anesthetics or medicines used during the procedure. BEFORE THE PROCEDURE   Follow your health care provider's instructions if you are taking dietary supplements or medicines. You may need to stop taking them or reduce your dosage.   Do not take any new dietary supplements or medicines without asking your health care provider first.   Follow your health care provider's instructions about eating and drinking before the procedure. You may need to stop eating and drinking several hours before the procedure.   Arrange to have an adult drive you home after the procedure. PROCEDURE  You may need to remove your clothing and dress in an open-back gown so that your health care provider can access your spine.   The procedure will be done while you are lying on an X-ray table. Most of the time you will be asked to lie on your stomach, but you may be asked to lie in a different position if an injection will be made in your neck.   Special machines will be used to monitor your oxygen levels,  heart rate, and blood pressure.   If an injection will be made in your neck, an intravenous (IV) tube will be inserted into one of your veins. Fluids and medicine will flow directly into your body through the IV tube.   The area over the facet joint where the injection will be made will be cleaned with an antiseptic soap. The surrounding skin will be covered with sterile drapes.   An anesthetic will be  applied to your skin to make the injection area numb. You may feel a temporary stinging or burning sensation.   A video X-ray machine will be used to locate the joint. A contrast dye may be injected into the facet joint area to help with locating the joint.   When the joint is located, an anesthetic medicine will be injected into the joint through the needle.   Your health care provider will ask you whether you feel pain relief. If you do feel relief, a steroid may be injected to provide pain relief for a longer period of time. If you do not feel relief or feel only partial relief, additional injections of an anesthetic may be made in other facet joints.   The needle will be removed, the skin will be cleansed, and bandages will be applied.  AFTER THE PROCEDURE   You will be observed for 15-30 minutes before being allowed to go home. Do not drive. Have an adult drive you or take a taxi or public transportation instead.   If you feel pain relief, the pain will return in several hours or days when the anesthetic wears off.   You may feel pain relief 2-14 days after the procedure. The amount of time this relief lasts varies from person to person.   It is normal to feel some tenderness over the injected area(s) for 2 days following the procedure.   If you have diabetes, you may have a temporary increase in blood sugar. Document Released: 01/28/2007 Document Revised: 01/23/2014 Document Reviewed: 06/28/2012 [facility] Patient Information 2015 [place], [place]. This information is not intended to replace advice given to you by your health care provider. Make sure you discuss any questions you have with your health care provider.

## 2015-05-22 NOTE — Progress Notes (Signed)
   Subjective:    Patient ID: Lisa [name], female    DOB: [birth year], 81 y.o.   MRN: [id number]  HPI  Patient is a 81-year-old female [facility] further evaluation and treatment of pain involving the region of the lower back and lower extremity region. Patient states that the pain is aggravated by standing walking with pain associated with burning sensation of the lower extremities. Patient denies trauma change in events of daily living the call significant changes of the pathology. Patient is with prior surgical intervention of the lumbar region without plans for additional surgical intervention wished to proceed interventional treatment at time return appointment in attempt to decrease severely disabling lower back lower extremity pain paresthesias. The patient was understanding and agreement status treatment plan we will proceed with lumbar epidural steroid injection at time return appointment as discussed. Patient will continue hydrocodone acetaminophen. The patient was with understanding and agree suggested treatment plan.     Review of Systems     Objective:   Physical Exam  There was mild tennis over the splenius capitis and occipitalis musculature regions. Palpation over the cervical facet cervical paraspinal muscles region reproduced mild discomfort. There was mild tenderness of the thoracic facet thoracic paraspinal musculature region. There appeared to be unremarkable Spurling's maneuver. Tinel and Phalen's maneuver were without increased pain of significant degree. There was minimal tenderness of the acromioclavicular and glenohumeral joint regions patient appeared to be with bilaterally equal grip strength. Palpation over the lumbar paraspinal musculature and lumbar facet region was with increased pain of moderate degree with lateral bending and rotation and extension and palpation of the lumbar facets reproducing moderate to moderately severe discomfort. There  was decreased straight leg raising on the left compared to the right with pressure decreased sensation along the L5 dermatomal distribution detected. There was negative clonus negative Homans. DTRs difficult to elicit patient had difficulty relaxing. With appeared to be decreased. There was negative clonus negative Homans. Mild tenderness of the greater trochanteric region iliotibial band region. Abdomen nontender and no costovertebral angle tenderness noted.      Assessment & Plan:    Degenerative disc disease lumbar spine L1-2 and L2-3 disc bulge with leftward protrusion partially calcified moderate disc space narrowing, worse on the left, advanced facet arthropathy and ligamentum flavum hypertrophy, disc material and narrowing of the foramen on the left with mild central stenosis left greater than the right L2-3 nerve root impingement, L3-4 central protrusion and facet and ligamentum flavum hypertrophy and central canal stenosis, L4-5 facet mediated postsurgical anterior listhesis, annular disc bulging right greater than the left, L4-L5 nerve root impingement, L5-S1 disc space narrowing, osseous spurring extends to both sides worse on the right with facet hypertrophy arthropathy and central protrusion  Lumbar facet syndrome  Lumbar radiculopathy  Sacroiliac joint dysfunction    Plan   Continue present medications hydrocodone acetaminophen  Lumbosacral selective nerve blocks to be performed at time return appointment  F/U PCP Dr.Babaoff  for evaliation of  BP and general medical  condition  F/U surgical evaluation with Dr Jeral Fruit as discussed  F/U neurological evaluatio. We'll consider [facility] EMG studies and other studies pending response to treatment and follow-up evaluation   May consider radiofrequency rhizolysis or intraspinal procedures pending response to present treatment and F/U evaluation   Patient to call [facility] should patient have concerns prior to scheduled  return appointment.

## 2015-06-06 ENCOUNTER — Encounter: Payer: Self-pay | Admitting: Pain Medicine

## 2015-06-06 ENCOUNTER — Ambulatory Visit: Payer: Medicare Other | Attending: Pain Medicine | Admitting: Pain Medicine

## 2015-06-06 VITALS — BP 130/51 | HR 58 | Temp 98.2°F | Resp 16 | Ht 62.0 in | Wt 148.0 lb

## 2015-06-06 DIAGNOSIS — M48062 Spinal stenosis, lumbar region with neurogenic claudication: Secondary | ICD-10-CM

## 2015-06-06 DIAGNOSIS — M5126 Other intervertebral disc displacement, lumbar region: Secondary | ICD-10-CM | POA: Insufficient documentation

## 2015-06-06 DIAGNOSIS — M79605 Pain in left leg: Secondary | ICD-10-CM | POA: Diagnosis present

## 2015-06-06 DIAGNOSIS — M5136 Other intervertebral disc degeneration, lumbar region: Secondary | ICD-10-CM | POA: Insufficient documentation

## 2015-06-06 DIAGNOSIS — S8290XE Unspecified fracture of unspecified lower leg, subsequent encounter for open fracture type I or II with routine healing: Secondary | ICD-10-CM

## 2015-06-06 DIAGNOSIS — M4806 Spinal stenosis, lumbar region: Secondary | ICD-10-CM | POA: Insufficient documentation

## 2015-06-06 DIAGNOSIS — M961 Postlaminectomy syndrome, not elsewhere classified: Secondary | ICD-10-CM

## 2015-06-06 DIAGNOSIS — M47816 Spondylosis without myelopathy or radiculopathy, lumbar region: Secondary | ICD-10-CM

## 2015-06-06 DIAGNOSIS — M79604 Pain in right leg: Secondary | ICD-10-CM | POA: Diagnosis present

## 2015-06-06 DIAGNOSIS — M545 Low back pain: Secondary | ICD-10-CM | POA: Diagnosis present

## 2015-06-06 DIAGNOSIS — S8290XD Unspecified fracture of unspecified lower leg, subsequent encounter for closed fracture with routine healing: Secondary | ICD-10-CM

## 2015-06-06 DIAGNOSIS — M533 Sacrococcygeal disorders, not elsewhere classified: Secondary | ICD-10-CM

## 2015-06-06 DIAGNOSIS — M5416 Radiculopathy, lumbar region: Secondary | ICD-10-CM | POA: Insufficient documentation

## 2015-06-06 MED ORDER — ORPHENADRINE CITRATE 30 MG/ML IJ SOLN
INTRAMUSCULAR | Status: AC
  Administered 2015-06-06: 09:00:00
  Filled 2015-06-06: qty 2

## 2015-06-06 MED ORDER — FENTANYL CITRATE (PF) 100 MCG/2ML IJ SOLN
INTRAMUSCULAR | Status: AC
  Administered 2015-06-06: 50 ug via INTRAVENOUS
  Filled 2015-06-06: qty 2

## 2015-06-06 MED ORDER — CEFAZOLIN SODIUM 1 G IJ SOLR
INTRAMUSCULAR | Status: AC
  Administered 2015-06-06: 1 g via INTRAVENOUS
  Filled 2015-06-06: qty 10

## 2015-06-06 MED ORDER — MIDAZOLAM HCL 5 MG/5ML IJ SOLN
INTRAMUSCULAR | Status: AC
  Administered 2015-06-06: 2 mg via INTRAVENOUS
  Filled 2015-06-06: qty 5

## 2015-06-06 MED ORDER — CEFUROXIME AXETIL 250 MG PO TABS: 250.0000 mg | ORAL_TABLET | Freq: Two times a day (BID) | ORAL | Status: DC

## 2015-06-06 MED ORDER — TRIAMCINOLONE ACETONIDE 40 MG/ML IJ SUSP
INTRAMUSCULAR | Status: AC
  Administered 2015-06-06: 09:00:00
  Filled 2015-06-06: qty 1

## 2015-06-06 MED ORDER — BUPIVACAINE HCL (PF) 0.25 % IJ SOLN
INTRAMUSCULAR | Status: AC
  Administered 2015-06-06: 09:00:00
  Filled 2015-06-06: qty 30

## 2015-06-06 NOTE — Progress Notes (Signed)
Safety precautions to be maintained throughout the outpatient stay will include: orient to surroundings, keep bed in low position, maintain call bell within reach at all times, provide assistance with transfer out of bed and ambulation.  

## 2015-06-06 NOTE — Progress Notes (Signed)
Subjective:    Patient ID: Lisa [name], female    DOB: March 10, 1927, 81 y.o.   MRN: [id number]  HPI  PROCEDURE PERFORMED: Lumbosacral selective nerve root block   NOTE: The patient is a 81 y.o. female who [facility] for further evaluation and treatment of pain involving the lumbar and lower extremity region. Studies consisting of MRI has revealed the patient to be with evidence of degenerative disc disease lumbar spine with L1-2 and L2-3 disc bulge with leftward protrusion partially calcified moderate disc space narrowing, worse on the left, advanced facet arthropathy and ligamentum flavum hypertrophy, disc material and narrowing of the foramen on the left with mild central stenosis left greater than the right L2-3 nerve root impingement, L3-4 central protrusion and facet and ligamentum flavum hypertrophy and central canal stenosis, L4-5 facet mediated postsurgical anterior listhesis, annular disc bulging right greater than the left, L4-L5 nerve root impingement, L5-S1 disc space narrowing, osseous spurring extends to both sides worse on the right with facet hypertrophy arthropathy and central protrusion. There is concern regarding intraspinal abnormalities contributing to the patient's symptomatology . There is concern regarding radiculopathy as well as stenosis with neurogenic claudication felt to be contributing to patient's symptoms. The risks, benefits, and expectations of the procedure have been explained to the patient who was understanding and in agreement with suggested treatment plan. We will proceed with interventional treatment as discussed and as explained to the patient. The patient is understanding and in agreement with suggested treatment plan.   DESCRIPTION OF PROCEDURE: Lumbosacral selective nerve root block with IV Versed, IV fentanyl conscious sedation, EKG, blood pressure, pulse, and pulse oximetry monitoring. The procedure was performed with the patient in the  prone position under fluoroscopic guidance. With the patient in the prone position, Betadine prep of proposed entry site was performed. Local anesthetic skin wheal of proposed needle entry site was prepared with 1.5% plain lidocaine with AP view of the lumbosacral spine.   PROCEDURE #1: Needle placement at the left L 3 vertebral body: A 22 -gauge needle was inserted at the inferior border of the transverse process of the vertebral body with needle placed medial to the midline of the transverse process on AP view of the lumbosacral spine.   NEEDLE PLACEMENT AT  L4 and L5  VERTEBRAL BODY LEVELS  Needle  placement was accomplished at L4 and L5  vertebral body levels on the left side exactly as was accomplished at the L3  vertebral body level  and utilizing the same technique and under fluoroscopic guidance.  PROCEDURE #4: Needle placement at the S1 foramen. With the patient in the prone position with Betadine prep of proposed entry site accomplished, the S1 foramen was visualized under fluoroscopic guidance with AP view of the lumbosacral spine with cephalad orientation of the fluoroscope with local anesthetic skin wheal of 1.5% lidocaine of proposed needle entry site prepared. A 22-gauge needle was inserted S1 foramen under fluoroscopic guidance eliciting paresthesias radiating from the buttocks to the lower extremity after which needle was slightly withdrawn.   Needle placement was then verified on lateral view at all levels with needle tip documented to be in the posterior superior quadrant of the intervertebral foramen of  L 3, L4, and L5, and needle tip documented at the level of the S1 foramen. Following negative aspiration for heme and CSF at each level, each level was injected with 3 mL of 0.25% bupivacaine with Kenalog.    The patient tolerated the procedure  well. A total of 10 mg of Kenalog was utilized for the procedure.   PLAN:  1. Medications: Will continue presently prescribed  medications hydrocodone acetaminophen. 2. The patient is to undergo follow-up evaluation with PCP Dr.Babaoff  for evaluation of blood pressure and general medical condition status post procedure performed on today's visit. 3. Surgical follow-up evaluation.. Patient will follow-up with Dr. Jeral Fruit as discussed 4. Neurological evaluation. May consider updated [facility] EMG studies and other studies 5. May consider radiofrequency procedures, implantation type procedures and other treatment pending response to treatment and follow-up evaluation. 6. The patient has been advise do adhere to proper body mechanics and avoid activities which may aggravate condition. The patient has been advised to call the [facility] prior to scheduled return appointment should there be significant change in the patient's condition or should the patient have other concerns regarding condition prior to scheduled return appointment.   Review of Systems     Objective:   Physical Exam        Assessment & Plan:

## 2015-06-06 NOTE — Patient Instructions (Addendum)
PLAN  Continue present medication hydrocodone acetaminophen and begin taking antibiotic Ceftin as prescribed. Please obtain your antibiotic Ceftin today and begin taking antibiotic today  F/U PCP Dr.Babaoff  for evaliation of  BP and general medical  condition.  F/U surgical evaluation. Patient will follow-up with Dr.Botero  F/U neurological evaluation. May consider pending follow-up evaluations  May consider radiofrequency rhizolysis or intraspinal procedures pending response to present treatment and F/U evaluation.  Patient to call [facility] should patient have concerns prior to scheduled return appointment.  Pain Management Discharge Instructions  General Discharge Instructions :  If you need to reach your doctor call: Monday-Friday 8:00 am - 4:00 pm at [phone] or toll free [phone].  After clinic hours [phone] to have operator reach doctor.  Bring all of your medication bottles to all your appointments in the pain clinic.  To cancel or reschedule your appointment with Pain Management please remember to call 24 hours in advance to avoid a fee.  Refer to the educational materials which you have been given on: General Risks, I had my Procedure. Discharge Instructions, Post Sedation.  Post Procedure Instructions:  The drugs you were given will stay in your system until tomorrow, so for the next 24 hours you should not drive, make any legal decisions or drink any alcoholic beverages.  You may eat anything you prefer, but it is better to start with liquids then soups and crackers, and gradually work up to solid foods.  Please notify your doctor immediately if you have any unusual bleeding, trouble breathing or pain that is not related to your normal pain.  Depending on the type of procedure that was done, some parts of your body may feel week and/or numb.  This usually clears up by tonight or the next day.  Walk with the use of an assistive device or  accompanied by an adult for the 24 hours.  You may use ice on the affected area for the first 24 hours.  Put ice in a Ziploc bag and cover with a towel and place against area 15 minutes on 15 minutes off.  You may switch to heat after 24 hours.GENERAL RISKS AND COMPLICATIONS  What are the risk, side effects and possible complications? Generally speaking, most procedures are safe.  However, with any procedure there are risks, side effects, and the possibility of complications.  The risks and complications are dependent upon the sites that are lesioned, or the type of nerve block to be performed.  The closer the procedure is to the spine, the more serious the risks are.  Great care is taken when placing the radio frequency needles, block needles or lesioning probes, but sometimes complications can occur. 1. Infection: Any time there is an injection through the skin, there is a risk of infection.  This is why sterile conditions are used for these blocks.  There are four possible types of infection. 1. Localized skin infection. 2. Central Nervous System Infection-This can be in the form of Meningitis, which can be deadly. 3. Epidural Infections-This can be in the form of an epidural abscess, which can cause pressure inside of the spine, causing compression of the spinal cord with subsequent paralysis. This would require an emergency surgery to decompress, and there are no guarantees that the patient would recover from the paralysis. 4. Discitis-This is an infection of the intervertebral discs.  It occurs in about 1% of discography procedures.  It is difficult to treat and it may lead to surgery.  2. Pain: the needles have to go through skin and soft tissues, will cause soreness.       3. Damage to internal structures:  The nerves to be lesioned may be near blood vessels or    other nerves which can be potentially damaged.       4. Bleeding: Bleeding is more common if the patient is taking blood  thinners such as  aspirin, Coumadin, Ticiid, Plavix, etc., or if he/she have some genetic predisposition  such as hemophilia. Bleeding into the spinal canal can cause compression of the spinal  cord with subsequent paralysis.  This would require an emergency surgery to  decompress and there are no guarantees that the patient would recover from the  paralysis.       5. Pneumothorax:  Puncturing of a lung is a possibility, every time a needle is introduced in  the area of the chest or upper back.  Pneumothorax refers to free air around the  collapsed lung(s), inside of the thoracic cavity (chest cavity).  Another two possible  complications related to a similar event would include: Hemothorax and Chylothorax.   These are variations of the Pneumothorax, where instead of air around the collapsed  lung(s), you may have blood or chyle, respectively.       6. Spinal headaches: They may occur with any procedures in the area of the spine.       7. Persistent CSF (Cerebro-Spinal Fluid) leakage: This is a rare problem, but may occur  with prolonged intrathecal or epidural catheters either due to the formation of a fistulous  track or a dural tear.       8. Nerve damage: By working so close to the spinal cord, there is always a possibility of  nerve damage, which could be as serious as a permanent spinal cord injury with  paralysis.       9. Death:  Although rare, severe deadly allergic reactions known as "Anaphylactic  reaction" can occur to any of the medications used.      10. Worsening of the symptoms:  We can always make thing worse.  What are the chances of something like this happening? Chances of any of this occuring are extremely low.  By statistics, you have more of a chance of getting killed in a motor vehicle accident: while driving to the hospital than any of the above occurring .  Nevertheless, you should be aware that they are possibilities.  In general, it is similar to taking a shower.  Everybody knows  that you can slip, hit your head and get killed.  Does that mean that you should not shower again?  Nevertheless always keep in mind that statistics do not mean anything if you happen to be on the wrong side of them.  Even if a procedure has a 1 (one) in a 1,000,000 (million) chance of going wrong, it you happen to be that one..Also, keep in mind that by statistics, you have more of a chance of having something go wrong when taking medications.  Who should not have this procedure? If you are on a blood thinning medication (e.g. Coumadin, Plavix, see list of "Blood Thinners"), or if you have an active infection going on, you should not have the procedure.  If you are taking any blood thinners, please inform your physician.  How should I prepare for this procedure?  Do not eat or drink anything at least six hours prior to the procedure.  Bring a driver  with you .  It cannot be a taxi.  Come accompanied by an adult that can drive you back, and that is strong enough to help you if your legs get weak or numb from the local anesthetic.  Take all of your medicines the morning of the procedure with just enough water to swallow them.  If you have diabetes, make sure that you are scheduled to have your procedure done first thing in the morning, whenever possible.  If you have diabetes, take only half of your insulin dose and notify our nurse that you have done so as soon as you arrive at the clinic.  If you are diabetic, but only take blood sugar pills (oral hypoglycemic), then do not take them on the morning of your procedure.  You may take them after you have had the procedure.  Do not take aspirin or any aspirin-containing medications, at least eleven (11) days prior to the procedure.  They may prolong bleeding.  Wear loose fitting clothing that may be easy to take off and that you would not mind if it got stained with Betadine or blood.  Do not wear any jewelry or perfume  Remove any nail  coloring.  It will interfere with some of our monitoring equipment.  NOTE: Remember that this is not meant to be interpreted as a complete list of all possible complications.  Unforeseen problems may occur.  BLOOD THINNERS The following drugs contain aspirin or other products, which can cause increased bleeding during surgery and should not be taken for 2 weeks prior to and 1 week after surgery.  If you should need take something for relief of minor pain, you may take acetaminophen which is found in Tylenol,m Datril, Anacin-3 and Panadol. It is not blood thinner. The products listed below are.  Do not take any of the products listed below in addition to any listed on your instruction sheet.  A.P.C or A.P.C with Codeine Codeine Phosphate Capsules #3 Ibuprofen Ridaura  ABC compound Congesprin Imuran rimadil  Advil Cope Indocin Robaxisal  Alka-Seltzer Effervescent Pain Reliever and Antacid Coricidin or Coricidin-D  Indomethacin Rufen  Alka-Seltzer plus Cold Medicine Cosprin Ketoprofen S-A-C Tablets  Anacin Analgesic Tablets or Capsules Coumadin Korlgesic Salflex  Anacin Extra Strength Analgesic tablets or capsules CP-2 Tablets Lanoril Salicylate  Anaprox Cuprimine Capsules Levenox Salocol  Anexsia-D Dalteparin Magan Salsalate  Anodynos Darvon compound Magnesium Salicylate Sine-off  Ansaid Dasin Capsules Magsal Sodium Salicylate  Anturane Depen Capsules Marnal Soma  APF Arthritis pain formula Dewitt's Pills Measurin Stanback  Argesic Dia-Gesic Meclofenamic Sulfinpyrazone  Arthritis Bayer Timed Release Aspirin Diclofenac Meclomen Sulindac  Arthritis pain formula Anacin Dicumarol Medipren Supac  Analgesic (Safety coated) Arthralgen Diffunasal Mefanamic Suprofen  Arthritis Strength Bufferin Dihydrocodeine Mepro Compound Suprol  Arthropan liquid Dopirydamole Methcarbomol with Aspirin Synalgos  ASA tablets/Enseals Disalcid Micrainin Tagament  Ascriptin Doan's Midol Talwin  Ascriptin A/D Dolene  Mobidin Tanderil  Ascriptin Extra Strength Dolobid Moblgesic Ticlid  Ascriptin with Codeine Doloprin or Doloprin with Codeine Momentum Tolectin  Asperbuf Duoprin Mono-gesic Trendar  Aspergum Duradyne Motrin or Motrin IB Triminicin  Aspirin plain, buffered or enteric coated Durasal Myochrisine Trigesic  Aspirin Suppositories Easprin Nalfon Trillsate  Aspirin with Codeine Ecotrin Regular or Extra Strength Naprosyn Uracel  Atromid-S Efficin Naproxen Ursinus  Auranofin Capsules Elmiron Neocylate Vanquish  Axotal Emagrin Norgesic Verin  Azathioprine Empirin or Empirin with Codeine Normiflo Vitamin E  Azolid Emprazil Nuprin Voltaren  Bayer Aspirin plain, buffered or children's or timed BC Tablets or powders  Encaprin Orgaran Warfarin Sodium  Buff-a-Comp Enoxaparin Orudis Zorpin  Buff-a-Comp with Codeine Equegesic Os-Cal-Gesic   Buffaprin Excedrin plain, buffered or Extra Strength Oxalid   Bufferin Arthritis Strength Feldene Oxphenbutazone   Bufferin plain or Extra Strength Feldene Capsules Oxycodone with Aspirin   Bufferin with Codeine Fenoprofen Fenoprofen Pabalate or Pabalate-SF   Buffets II Flogesic Panagesic   Buffinol plain or Extra Strength Florinal or Florinal with Codeine Panwarfarin   Buf-Tabs Flurbiprofen Penicillamine   Butalbital Compound Four-way cold tablets Penicillin   Butazolidin Fragmin Pepto-Bismol   Carbenicillin Geminisyn Percodan   Carna Arthritis Reliever Geopen Persantine   Carprofen Gold's salt Persistin   Chloramphenicol Goody's Phenylbutazone   Chloromycetin Haltrain Piroxlcam   Clmetidine heparin Plaquenil   Cllnoril Hyco-pap Ponstel   Clofibrate Hydroxy chloroquine Propoxyphen         Before stopping any of these medications, be sure to consult the physician who ordered them.  Some, such as Coumadin (Warfarin) are ordered to prevent or treat serious conditions such as "deep thrombosis", "pumonary embolisms", and other heart problems.  The amount of time that  you may need off of the medication may also vary with the medication and the reason for which you were taking it.  If you are taking any of these medications, please make sure you notify your pain physician before you undergo any procedures.   Antibiotic prescription at pharmacy for pick up.

## 2015-06-07 ENCOUNTER — Telehealth: Payer: Self-pay

## 2015-06-07 NOTE — Telephone Encounter (Signed)
Pt states she is doing good 

## 2015-06-26 ENCOUNTER — Encounter: Payer: Self-pay | Admitting: Pain Medicine

## 2015-06-26 ENCOUNTER — Ambulatory Visit: Payer: Medicare Other | Attending: Pain Medicine | Admitting: Pain Medicine

## 2015-06-26 VITALS — BP 147/43 | HR 66 | Temp 96.5°F | Resp 18

## 2015-06-26 DIAGNOSIS — M5416 Radiculopathy, lumbar region: Secondary | ICD-10-CM | POA: Diagnosis not present

## 2015-06-26 DIAGNOSIS — M4806 Spinal stenosis, lumbar region: Secondary | ICD-10-CM | POA: Diagnosis not present

## 2015-06-26 DIAGNOSIS — M48062 Spinal stenosis, lumbar region with neurogenic claudication: Secondary | ICD-10-CM

## 2015-06-26 DIAGNOSIS — M47816 Spondylosis without myelopathy or radiculopathy, lumbar region: Secondary | ICD-10-CM

## 2015-06-26 DIAGNOSIS — M533 Sacrococcygeal disorders, not elsewhere classified: Secondary | ICD-10-CM

## 2015-06-26 DIAGNOSIS — S8290XD Unspecified fracture of unspecified lower leg, subsequent encounter for closed fracture with routine healing: Secondary | ICD-10-CM

## 2015-06-26 DIAGNOSIS — M545 Low back pain: Secondary | ICD-10-CM | POA: Diagnosis present

## 2015-06-26 DIAGNOSIS — M79605 Pain in left leg: Secondary | ICD-10-CM | POA: Diagnosis present

## 2015-06-26 DIAGNOSIS — M5136 Other intervertebral disc degeneration, lumbar region: Secondary | ICD-10-CM | POA: Diagnosis not present

## 2015-06-26 DIAGNOSIS — M961 Postlaminectomy syndrome, not elsewhere classified: Secondary | ICD-10-CM

## 2015-06-26 DIAGNOSIS — M5126 Other intervertebral disc displacement, lumbar region: Secondary | ICD-10-CM | POA: Diagnosis not present

## 2015-06-26 DIAGNOSIS — M79604 Pain in right leg: Secondary | ICD-10-CM | POA: Diagnosis present

## 2015-06-26 DIAGNOSIS — S8290XE Unspecified fracture of unspecified lower leg, subsequent encounter for open fracture type I or II with routine healing: Secondary | ICD-10-CM

## 2015-06-26 MED ORDER — HYDROCODONE-ACETAMINOPHEN 5-325 MG PO TABS: ORAL_TABLET | ORAL | Status: DC

## 2015-06-26 NOTE — Progress Notes (Signed)
Safety precautions to be maintained throughout the outpatient stay will include: orient to surroundings, keep bed in low position, maintain call bell within reach at all times, provide assistance with transfer out of bed and ambulation.  

## 2015-06-26 NOTE — Progress Notes (Signed)
Subjective:    Patient ID: Lisa [name], female    DOB: 09/24/1926, 80 y.o.   MRN: [id number]  HPI Patient is a 81 year old female returns to [facility] for further evaluation and treatment of pain involving the region of the lower back and lower extremity regions. Patient has had improvement of her pain with prior treatment and [facility] states that she has some return of pain of the lower back radiating to the lower extremity especially the left lower extremity. Patient denies any trauma change in events of daily living the cause change in symptomatology. Patient states that the pain becomes more intense as the day progresses and is associated with some numbness of the lower extremities which appears to be most noticeable in the end of the day. Patient states that pain is associated  With weakness of the lower extremity as well. We discussed patient's condition and will proceed with lumbar epidural steroid injection at time return appointment which is felt to be medically necessary procedure in attempt to decrease severity of patient's symptoms, minimize progression of patient's symptoms, and avoid the need for more involved treatment. The patient was with understanding and in agreement with suggested treatment plan.     Review of Systems     Objective:   Physical Exam There was minimal tenderness of the splenius capitis and occipitalis musculature regions. Palpation over the cervical facet cervical paraspinal musculature region reproduced minimal discomfort. There was minimal tenderness over the thoracic facet thoracic paraspinal musculature region. Patient was with unremarkable Spurling's maneuver and was with minimal tenderness of the acromioclavicular and glenohumeral joint regions. Tinel and Phalen's maneuver were without increased pain of significant degree. Patient was with bilaterally equal grip strength. There was no crepitus of the thoracic region noted. Palpation  over the lumbar paraspinal musculature region lumbar facet region associated with moderately severe discomfort on the left compared to the right. There was moderate to moderately severe tenderness to palpation of the gluteal and piriformis musculature region on the left compared to the right. Straight leg raising decreased on the left compared to the right with straight leg raising tolerates approximately 20 with questionable increased pain with dorsiflexion noted. There was negative clonus negative Homans. DTRs were difficult to elicit patient had difficulty relaxing. There was mild increased pain with pressure applied to the ilium with patient in lateral decubitus position and with palpation of the PSIS and PII S regions. There was question decreased sensation of the L5-S1 dermatomal distribution. There was negative clonus negative Homans. Abdomen nontender with no costovertebral angle tenderness noted.       Assessment & Plan:    Degenerative disc disease lumbar spine L1-2 and L2-3 disc bulge with leftward protrusion partially calcified moderate disc space narrowing, worse on the left, advanced facet arthropathy and ligamentum flavum hypertrophy, disc material and narrowing of the foramen on the left with mild central stenosis left greater than the right L2-3 nerve root impingement, L3-4 central protrusion and facet and ligamentum flavum hypertrophy and central canal stenosis, L4-5 facet mediated postsurgical anterior listhesis, annular disc bulging right greater than the left, L4-L5 nerve root impingement, L5-S1 disc space narrowing, osseous spurring extends to both sides worse on the right with facet hypertrophy arthropathy and central protrusion  Lumbar stenosis with neurogenic claudication  Lumbar facet syndrome  Lumbar radiculopathy  Sacroiliac joint dysfunction     PLAN   Continue present medication Hydrocodone acetaminophen  Lumbar epidural steroid injection to be performed at  time  return appointment  F/U PCP for evaliation of  BP and general medical  condition  F/U surgical evaluation as planned  F/U neurological evaluation. May consider pending follow-up evaluations  May consider radiofrequency rhizolysis or intraspinal procedures pending response to present treatment and F/U evaluation   Patient to call [facility] should patient have concerns prior to scheduled return appointment.

## 2015-06-26 NOTE — Patient Instructions (Addendum)
PLAN   Continue present medication hydrocodone acetaminophen   Lumbar epidural steroid injection to be performed at time return appointment  F/U PCP Dr.Babaoff   for evaliation of  BP and general medical  condition  F/U surgical evaluation with Dr. Jeral Fruit  F/U neurological evaluation. May consider pending follow-up evaluations  May consider radiofrequency rhizolysis or intraspinal procedures pending response to present treatment and F/U evaluation   Patient to call [facility] should patient have concerns prior to scheduled return appointment. Epidural Steroid Injection An epidural steroid injection is given to relieve pain in your neck, back, or legs that is caused by the irritation or swelling of a nerve root. This procedure involves injecting a steroid and numbing medicine (anesthetic) into the epidural space. The epidural space is the space between the outer covering of your spinal cord and the bones that form your backbone (vertebra).  LET [facility] CARE PROVIDER KNOW ABOUT:   Any allergies you have.  All medicines you are taking, including vitamins, herbs, eye drops, creams, and over-the-counter medicines such as aspirin.  Previous problems you or members of your family have had with the use of anesthetics.  Any blood disorders or blood clotting disorders you have.  Previous surgeries you have had.  Medical conditions you have. RISKS AND COMPLICATIONS Generally, this is a safe procedure. However, as with any procedure, complications can occur. Possible complications of epidural steroid injection include:  Headache.  Bleeding.  Infection.  Allergic reaction to the medicines.  Damage to your nerves. The response to this procedure depends on the underlying cause of the pain and its duration. People who have long-term (chronic) pain are less likely to benefit from epidural steroids than are those people whose pain comes on strong and suddenly. BEFORE THE  PROCEDURE   Ask your health care provider about changing or stopping your regular medicines. You may be advised to stop taking blood-thinning medicines a few days before the procedure.  You may be given medicines to reduce anxiety.  Arrange for someone to take you home after the procedure. PROCEDURE   You will remain awake during the procedure. You may receive medicine to make you relaxed.  You will be asked to lie on your stomach.  The injection site will be cleaned.  The injection site will be numbed with a medicine (local anesthetic).  A needle will be injected through your skin into the epidural space.  Your health care provider will use an X-ray machine to ensure that the steroid is delivered closest to the affected nerve. You may have minimal discomfort at this time.  Once the needle is in the right position, the local anesthetic and the steroid will be injected into the epidural space.  The needle will then be removed and a bandage will be applied to the injection site. AFTER THE PROCEDURE   You may be monitored for a short time before you go home.  You may feel weakness or numbness in your arm or leg, which disappears within hours.  You may be allowed to eat, drink, and take your regular medicine.  You may have soreness at the site of the injection. Document Released: 12/16/2007 Document Revised: 05/11/2013 Document Reviewed: 02/25/2013 [facility] Patient Information 2015 [place], [place]. This information is not intended to replace advice given to you by your health care provider. Make sure you discuss any questions you have with your health care provider. GENERAL RISKS AND COMPLICATIONS  What are the risk, side effects and possible complications?  Generally speaking, most procedures are safe.  However, with any procedure there are risks, side effects, and the possibility of complications.  The risks and complications are dependent upon the sites that are lesioned, or the  type of nerve block to be performed.  The closer the procedure is to the spine, the more serious the risks are.  Great care is taken when placing the radio frequency needles, block needles or lesioning probes, but sometimes complications can occur.  Infection: Any time there is an injection through the skin, there is a risk of infection.  This is why sterile conditions are used for these blocks.  There are four possible types of infection.  Localized skin infection.  Central Nervous System Infection-This can be in the form of Meningitis, which can be deadly.  Epidural Infections-This can be in the form of an epidural abscess, which can cause pressure inside of the spine, causing compression of the spinal cord with subsequent paralysis. This would require an emergency surgery to decompress, and there are no guarantees that the patient would recover from the paralysis.  Discitis-This is an infection of the intervertebral discs.  It occurs in about 1% of discography procedures.  It is difficult to treat and it may lead to surgery.        2. Pain: the needles have to go through skin and soft tissues, will cause soreness.       3. Damage to internal structures:  The nerves to be lesioned may be near blood vessels or    other nerves which can be potentially damaged.       4. Bleeding: Bleeding is more common if the patient is taking blood thinners such as  aspirin, Coumadin, Ticiid, Plavix, etc., or if he/she have some genetic predisposition  such as hemophilia. Bleeding into the spinal canal can cause compression of the spinal  cord with subsequent paralysis.  This would require an emergency surgery to  decompress and there are no guarantees that the patient would recover from the  paralysis.       5. Pneumothorax:  Puncturing of a lung is a possibility, every time a needle is introduced in  the area of the chest or upper back.  Pneumothorax refers to free air around the  collapsed lung(s), inside of the  thoracic cavity (chest cavity).  Another two possible  complications related to a similar event would include: Hemothorax and Chylothorax.   These are variations of the Pneumothorax, where instead of air around the collapsed  lung(s), you may have blood or chyle, respectively.       6. Spinal headaches: They may occur with any procedures in the area of the spine.       7. Persistent CSF (Cerebro-Spinal Fluid) leakage: This is a rare problem, but may occur  with prolonged intrathecal or epidural catheters either due to the formation of a fistulous  track or a dural tear.       8. Nerve damage: By working so close to the spinal cord, there is always a possibility of  nerve damage, which could be as serious as a permanent spinal cord injury with  paralysis.       9. Death:  Although rare, severe deadly allergic reactions known as "Anaphylactic  reaction" can occur to any of the medications used.      10. Worsening of the symptoms:  We can always make thing worse.  What are the chances of something like this happening? Chances of any  of this occuring are extremely low.  By statistics, you have more of a chance of getting killed in a motor vehicle accident: while driving to the hospital than any of the above occurring .  Nevertheless, you should be aware that they are possibilities.  In general, it is similar to taking a shower.  Everybody knows that you can slip, hit your head and get killed.  Does that mean that you should not shower again?  Nevertheless always keep in mind that statistics do not mean anything if you happen to be on the wrong side of them.  Even if a procedure has a 1 (one) in a 1,000,000 (million) chance of going wrong, it you happen to be that one..Also, keep in mind that by statistics, you have more of a chance of having something go wrong when taking medications.  Who should not have this procedure? If you are on a blood thinning medication (e.g. Coumadin, Plavix, see list of "Blood  Thinners"), or if you have an active infection going on, you should not have the procedure.  If you are taking any blood thinners, please inform your physician.  How should I prepare for this procedure?  Do not eat or drink anything at least six hours prior to the procedure.  Bring a driver with you .  It cannot be a taxi.  Come accompanied by an adult that can drive you back, and that is strong enough to help you if your legs get weak or numb from the local anesthetic.  Take all of your medicines the morning of the procedure with just enough water to swallow them.  If you have diabetes, make sure that you are scheduled to have your procedure done first thing in the morning, whenever possible.  If you have diabetes, take only half of your insulin dose and notify our nurse that you have done so as soon as you arrive at the clinic.  If you are diabetic, but only take blood sugar pills (oral hypoglycemic), then do not take them on the morning of your procedure.  You may take them after you have had the procedure.  Do not take aspirin or any aspirin-containing medications, at least eleven (11) days prior to the procedure.  They may prolong bleeding.  Wear loose fitting clothing that may be easy to take off and that you would not mind if it got stained with Betadine or blood.  Do not wear any jewelry or perfume  Remove any nail coloring.  It will interfere with some of our monitoring equipment.  NOTE: Remember that this is not meant to be interpreted as a complete list of all possible complications.  Unforeseen problems may occur.  BLOOD THINNERS The following drugs contain aspirin or other products, which can cause increased bleeding during surgery and should not be taken for 2 weeks prior to and 1 week after surgery.  If you should need take something for relief of minor pain, you may take acetaminophen which is found in Tylenol,m Datril, Anacin-3 and Panadol. It is not blood thinner. The  products listed below are.  Do not take any of the products listed below in addition to any listed on your instruction sheet.  A.P.C or A.P.C with Codeine Codeine Phosphate Capsules #3 Ibuprofen Ridaura  ABC compound Congesprin Imuran rimadil  Advil Cope Indocin Robaxisal  Alka-Seltzer Effervescent Pain Reliever and Antacid Coricidin or Coricidin-D  Indomethacin Rufen  Alka-Seltzer plus Cold Medicine Cosprin Ketoprofen S-A-C Tablets  Anacin Analgesic Tablets  or Capsules Coumadin Korlgesic Salflex  Anacin Extra Strength Analgesic tablets or capsules CP-2 Tablets Lanoril Salicylate  Anaprox Cuprimine Capsules Levenox Salocol  Anexsia-D Dalteparin Magan Salsalate  Anodynos Darvon compound Magnesium Salicylate Sine-off  Ansaid Dasin Capsules Magsal Sodium Salicylate  Anturane Depen Capsules Marnal Soma  APF Arthritis pain formula Dewitt's Pills Measurin Stanback  Argesic Dia-Gesic Meclofenamic Sulfinpyrazone  Arthritis Bayer Timed Release Aspirin Diclofenac Meclomen Sulindac  Arthritis pain formula Anacin Dicumarol Medipren Supac  Analgesic (Safety coated) Arthralgen Diffunasal Mefanamic Suprofen  Arthritis Strength Bufferin Dihydrocodeine Mepro Compound Suprol  Arthropan liquid Dopirydamole Methcarbomol with Aspirin Synalgos  ASA tablets/Enseals Disalcid Micrainin Tagament  Ascriptin Doan's Midol Talwin  Ascriptin A/D Dolene Mobidin Tanderil  Ascriptin Extra Strength Dolobid Moblgesic Ticlid  Ascriptin with Codeine Doloprin or Doloprin with Codeine Momentum Tolectin  Asperbuf Duoprin Mono-gesic Trendar  Aspergum Duradyne Motrin or Motrin IB Triminicin  Aspirin plain, buffered or enteric coated Durasal Myochrisine Trigesic  Aspirin Suppositories Easprin Nalfon Trillsate  Aspirin with Codeine Ecotrin Regular or Extra Strength Naprosyn Uracel  Atromid-S Efficin Naproxen Ursinus  Auranofin Capsules Elmiron Neocylate Vanquish  Axotal Emagrin Norgesic Verin  Azathioprine Empirin or Empirin  with Codeine Normiflo Vitamin E  Azolid Emprazil Nuprin Voltaren  Bayer Aspirin plain, buffered or children's or timed BC Tablets or powders Encaprin Orgaran Warfarin Sodium  Buff-a-Comp Enoxaparin Orudis Zorpin  Buff-a-Comp with Codeine Equegesic Os-Cal-Gesic   Buffaprin Excedrin plain, buffered or Extra Strength Oxalid   Bufferin Arthritis Strength Feldene Oxphenbutazone   Bufferin plain or Extra Strength Feldene Capsules Oxycodone with Aspirin   Bufferin with Codeine Fenoprofen Fenoprofen Pabalate or Pabalate-SF   Buffets II Flogesic Panagesic   Buffinol plain or Extra Strength Florinal or Florinal with Codeine Panwarfarin   Buf-Tabs Flurbiprofen Penicillamine   Butalbital Compound Four-way cold tablets Penicillin   Butazolidin Fragmin Pepto-Bismol   Carbenicillin Geminisyn Percodan   Carna Arthritis Reliever Geopen Persantine   Carprofen Gold's salt Persistin   Chloramphenicol Goody's Phenylbutazone   Chloromycetin Haltrain Piroxlcam   Clmetidine heparin Plaquenil   Cllnoril Hyco-pap Ponstel   Clofibrate Hydroxy chloroquine Propoxyphen         Before stopping any of these medications, be sure to consult the physician who ordered them.  Some, such as Coumadin (Warfarin) are ordered to prevent or treat serious conditions such as "deep thrombosis", "pumonary embolisms", and other heart problems.  The amount of time that you may need off of the medication may also vary with the medication and the reason for which you were taking it.  If you are taking any of these medications, please make sure you notify your pain physician before you undergo any procedures.

## 2015-07-12 ENCOUNTER — Other Ambulatory Visit: Payer: Self-pay | Admitting: Pain Medicine

## 2015-07-16 ENCOUNTER — Encounter: Payer: Self-pay | Admitting: Pain Medicine

## 2015-07-16 ENCOUNTER — Ambulatory Visit: Payer: Medicare Other | Attending: Pain Medicine | Admitting: Pain Medicine

## 2015-07-16 VITALS — BP 122/40 | HR 62 | Temp 96.8°F | Resp 16 | Ht 63.0 in | Wt 148.0 lb

## 2015-07-16 DIAGNOSIS — M545 Low back pain: Secondary | ICD-10-CM | POA: Insufficient documentation

## 2015-07-16 DIAGNOSIS — M47816 Spondylosis without myelopathy or radiculopathy, lumbar region: Secondary | ICD-10-CM

## 2015-07-16 DIAGNOSIS — M961 Postlaminectomy syndrome, not elsewhere classified: Secondary | ICD-10-CM

## 2015-07-16 DIAGNOSIS — M533 Sacrococcygeal disorders, not elsewhere classified: Secondary | ICD-10-CM

## 2015-07-16 DIAGNOSIS — Z79899 Other long term (current) drug therapy: Secondary | ICD-10-CM | POA: Insufficient documentation

## 2015-07-16 DIAGNOSIS — S8290XD Unspecified fracture of unspecified lower leg, subsequent encounter for closed fracture with routine healing: Secondary | ICD-10-CM

## 2015-07-16 DIAGNOSIS — M48062 Spinal stenosis, lumbar region with neurogenic claudication: Secondary | ICD-10-CM

## 2015-07-16 DIAGNOSIS — M5136 Other intervertebral disc degeneration, lumbar region: Secondary | ICD-10-CM

## 2015-07-16 DIAGNOSIS — S8290XE Unspecified fracture of unspecified lower leg, subsequent encounter for open fracture type I or II with routine healing: Secondary | ICD-10-CM

## 2015-07-16 DIAGNOSIS — M5416 Radiculopathy, lumbar region: Secondary | ICD-10-CM

## 2015-07-16 MED ORDER — CEFUROXIME AXETIL 250 MG PO TABS: 250.0000 mg | ORAL_TABLET | Freq: Two times a day (BID) | ORAL | Status: DC

## 2015-07-16 MED ORDER — ORPHENADRINE CITRATE 30 MG/ML IJ SOLN
60.0000 mg | Freq: Once | INTRAMUSCULAR | Status: AC
  Administered 2015-07-16: 60 mg via INTRAMUSCULAR

## 2015-07-16 MED ORDER — TRIAMCINOLONE ACETONIDE 40 MG/ML IJ SUSP
INTRAMUSCULAR | Status: AC
  Filled 2015-07-16: qty 1

## 2015-07-16 MED ORDER — MIDAZOLAM HCL 5 MG/5ML IJ SOLN
INTRAMUSCULAR | Status: AC
  Filled 2015-07-16: qty 5

## 2015-07-16 MED ORDER — LIDOCAINE HCL (PF) 1 % IJ SOLN
INTRAMUSCULAR | Status: AC
  Filled 2015-07-16: qty 5

## 2015-07-16 MED ORDER — ORPHENADRINE CITRATE 30 MG/ML IJ SOLN
INTRAMUSCULAR | Status: AC
  Filled 2015-07-16: qty 2

## 2015-07-16 MED ORDER — FENTANYL CITRATE (PF) 100 MCG/2ML IJ SOLN
100.0000 ug | Freq: Once | INTRAMUSCULAR | Status: AC
  Administered 2015-07-16: 50 ug via INTRAVENOUS

## 2015-07-16 MED ORDER — BUPIVACAINE HCL (PF) 0.25 % IJ SOLN
INTRAMUSCULAR | Status: AC
  Filled 2015-07-16: qty 30

## 2015-07-16 MED ORDER — LIDOCAINE HCL (PF) 1 % IJ SOLN
10.0000 mL | Freq: Once | INTRAMUSCULAR | Status: AC
  Administered 2015-07-16: 5 mL via SUBCUTANEOUS

## 2015-07-16 MED ORDER — LACTATED RINGERS IV SOLN: 1000.0000 mL | INTRAVENOUS | Status: DC

## 2015-07-16 MED ORDER — MIDAZOLAM HCL 5 MG/5ML IJ SOLN
5.0000 mg | Freq: Once | INTRAMUSCULAR | Status: AC
  Administered 2015-07-16: 2 mg via INTRAVENOUS

## 2015-07-16 MED ORDER — TRIAMCINOLONE ACETONIDE 40 MG/ML IJ SUSP
40.0000 mg | Freq: Once | INTRAMUSCULAR | Status: AC
  Administered 2015-07-16: 40 mg

## 2015-07-16 MED ORDER — CEFAZOLIN SODIUM 1 G IJ SOLR
INTRAMUSCULAR | Status: AC
  Filled 2015-07-16: qty 10

## 2015-07-16 MED ORDER — SODIUM CHLORIDE 0.9 % IJ SOLN
20.0000 mL | Freq: Once | INTRAMUSCULAR | Status: AC
  Administered 2015-07-16: 4 mL

## 2015-07-16 MED ORDER — SODIUM CHLORIDE 0.9 % IJ SOLN
INTRAMUSCULAR | Status: AC
  Filled 2015-07-16: qty 20

## 2015-07-16 MED ORDER — CEFAZOLIN SODIUM 1-5 GM-% IV SOLN
1.0000 g | Freq: Once | INTRAVENOUS | Status: AC
  Administered 2015-07-16: 1 g via INTRAVENOUS

## 2015-07-16 MED ORDER — FENTANYL CITRATE (PF) 100 MCG/2ML IJ SOLN
INTRAMUSCULAR | Status: AC
  Filled 2015-07-16: qty 2

## 2015-07-16 MED ORDER — BUPIVACAINE HCL (PF) 0.25 % IJ SOLN
30.0000 mL | Freq: Once | INTRAMUSCULAR | Status: AC
  Administered 2015-07-16: 10 mL

## 2015-07-16 NOTE — Progress Notes (Signed)
Safety precautions to be maintained throughout the outpatient stay will include: orient to surroundings, keep bed in low position, maintain call bell within reach at all times, provide assistance with transfer out of bed and ambulation.  

## 2015-07-16 NOTE — Patient Instructions (Addendum)
PLAN  Continue present medication Hydrocodone acetaminophen and begin taking antibioticCeftin as prescribed. Please obtain your antibiotic today and begin taking antibiotic today  F/U PCP  Dr Larwance SachsBabaoff for evaliation of  BP and general medical  condition.  F/U surgical evaluation. Follow-up Dr. Allena KatzPatel as planned  F/U neurological evaluation. May consider pending follow-up evaluations  May consider radiofrequency rhizolysis or intraspinal procedures pending response to present treatment and F/U evaluation.  Patient to call [facility] Management Center should patient have concerns prior to scheduled return appointment.   Pain Management Discharge Instructions  General Discharge Instructions :  If you need to reach your doctor call: Monday-Friday 8:00 am - 4:00 pm at [phone] or toll free [phone].  After clinic hours [phone] to have operator reach doctor.  Bring all of your medication bottles to all your appointments in the pain clinic.  To cancel or reschedule your appointment with Pain Management please remember to call 24 hours in advance to avoid a fee.  Refer to the educational materials which you have been given on: General Risks, I had my Procedure. Discharge Instructions, Post Sedation.  Post Procedure Instructions:  The drugs you were given will stay in your system until tomorrow, so for the next 24 hours you should not drive, make any legal decisions or drink any alcoholic beverages.  You may eat anything you prefer, but it is better to start with liquids then soups and crackers, and gradually work up to solid foods.  Please notify your doctor immediately if you have any unusual bleeding, trouble breathing or pain that is not related to your normal pain.  Depending on the type of procedure that was done, some parts of your body may feel week and/or numb.  This usually clears up by tonight or the next day.  Walk with the use of an assistive device or accompanied by an  adult for the 24 hours.  You may use ice on the affected area for the first 24 hours.  Put ice in a Ziploc bag and cover with a towel and place against area 15 minutes on 15 minutes off.  You may switch to heat after 24 hours.  A prescription for CEFTIN was sent to your pharmacy and should be available for pickup today.

## 2015-07-17 ENCOUNTER — Telehealth: Payer: Self-pay

## 2015-07-17 NOTE — Telephone Encounter (Signed)
Left message

## 2015-07-24 ENCOUNTER — Encounter: Payer: Self-pay | Admitting: Pain Medicine

## 2015-07-24 ENCOUNTER — Ambulatory Visit: Payer: Medicare Other | Attending: Pain Medicine | Admitting: Pain Medicine

## 2015-07-24 VITALS — BP 134/45 | HR 67 | Temp 98.6°F | Resp 18 | Ht 63.0 in | Wt 148.0 lb

## 2015-07-24 DIAGNOSIS — M1288 Other specific arthropathies, not elsewhere classified, other specified site: Secondary | ICD-10-CM | POA: Diagnosis not present

## 2015-07-24 DIAGNOSIS — M5126 Other intervertebral disc displacement, lumbar region: Secondary | ICD-10-CM | POA: Insufficient documentation

## 2015-07-24 DIAGNOSIS — M5416 Radiculopathy, lumbar region: Secondary | ICD-10-CM | POA: Insufficient documentation

## 2015-07-24 DIAGNOSIS — M4806 Spinal stenosis, lumbar region: Secondary | ICD-10-CM | POA: Insufficient documentation

## 2015-07-24 DIAGNOSIS — M545 Low back pain: Secondary | ICD-10-CM | POA: Diagnosis present

## 2015-07-24 DIAGNOSIS — M5136 Other intervertebral disc degeneration, lumbar region: Secondary | ICD-10-CM | POA: Diagnosis not present

## 2015-07-24 DIAGNOSIS — M961 Postlaminectomy syndrome, not elsewhere classified: Secondary | ICD-10-CM

## 2015-07-24 DIAGNOSIS — S8290XD Unspecified fracture of unspecified lower leg, subsequent encounter for closed fracture with routine healing: Secondary | ICD-10-CM

## 2015-07-24 DIAGNOSIS — M533 Sacrococcygeal disorders, not elsewhere classified: Secondary | ICD-10-CM

## 2015-07-24 DIAGNOSIS — M47816 Spondylosis without myelopathy or radiculopathy, lumbar region: Secondary | ICD-10-CM

## 2015-07-24 DIAGNOSIS — M48062 Spinal stenosis, lumbar region with neurogenic claudication: Secondary | ICD-10-CM

## 2015-07-24 DIAGNOSIS — M79604 Pain in right leg: Secondary | ICD-10-CM | POA: Diagnosis present

## 2015-07-24 DIAGNOSIS — S8290XE Unspecified fracture of unspecified lower leg, subsequent encounter for open fracture type I or II with routine healing: Secondary | ICD-10-CM

## 2015-07-24 DIAGNOSIS — M79605 Pain in left leg: Secondary | ICD-10-CM | POA: Diagnosis present

## 2015-07-24 MED ORDER — HYDROCODONE-ACETAMINOPHEN 5-325 MG PO TABS: ORAL_TABLET | ORAL | Status: DC

## 2015-07-24 NOTE — Progress Notes (Signed)
Safety precautions to be maintained throughout the outpatient stay will include: orient to surroundings, keep bed in low position, maintain call bell within reach at all times, provide assistance with transfer out of bed and ambulation.  

## 2015-07-24 NOTE — Progress Notes (Signed)
   Subjective:    Patient ID: Lisa [name], female    DOB: 03/26/1927, 81 y.o.   MRN: [id number]  HPI  Patient is a 81 year old female who returns to [facility] for further evaluation and treatment of pain involving the lower back and lower extremity regions. Patient has had significant improvement of her pain following previous interventional treatment consisting of lumbar epidural steroid injection. Patient denies trauma change in events of daily living the cause change in symptomatology. Patient continues to do well at this time with greater than 80% relief of her pain following lumbar epidural steroid injection. We will continue hydrocodone acetaminophen and remain available to consider patient for modifications of treatment pending response to treatment and follow-up evaluation. The patient was with understanding and agreement with suggested treatment plan   Review of Systems     Objective:   Physical Exam There was mild tenderness of the splenius capitis and occipitalis musculature region. There was mild tenderness over the cervical facet cervical paraspinal musculature region. There was mild tenderness of the thoracic facet thoracic paraspinal musculature regions. Palpation of the acromioclavicular and glenohumeral joint regions reproduced minimal discomfort and patient appeared to be with unremarkable Spurling's maneuver. Patient was with bilaterally equal grip strength and Tinel and Phalen's maneuver were without increase of pain of any significant degree. There was no crepitus of the thoracic region noted. Palpation over the lumbar paraspinal musculatures and lumbar facet region was a tends to palpation of mild discomfort. Lateral bending and rotation extension and palpation of the lumbar facets reproduce mild discomfort. Straight leg raising was tolerates approximately 30 without a definite increase of pain with dorsiflexion noted. There appeared to be decreased sensation of the  S1 dermatomal distribution. DTRs were difficult to elicit appeared to be traced at the knees. There was mild tenderness of the PSIS and PII S regions There was negative clonus negative Homans Abdomen was nontender with no costovertebral angle tenderness noted.       Assessment & Plan:    Degenerative disc disease lumbar spine L1-2 and L2-3 disc bulge with leftward protrusion partially calcified moderate disc space narrowing, worse on the left, advanced facet arthropathy and ligamentum flavum hypertrophy, disc material and narrowing of the foramen on the left with mild central stenosis left greater than the right L2-3 nerve root impingement, L3-4 central protrusion and facet and ligamentum flavum hypertrophy and central canal stenosis, L4-5 facet mediated postsurgical anterior listhesis, annular disc bulging right greater than the left, L4-L5 nerve root impingement, L5-S1 disc space narrowing, osseous spurring extends to both sides worse on the right with facet hypertrophy arthropathy and central protrusion  Lumbar stenosis with neurogenic claudication  Lumbar facet syndrome  Lumbar radiculopathy    PLAN    Continue present medication Hydrocodone acetaminophen  F/U PCP Dr.Babaoff  for evaliation of  BP and general medical  condition  F/U surgical evaluation as planne Patient will follow-up with Dr. Jeral FruitBotero as needed   F/U neurological evaluation. May consider pending follow-up evaluations  May consider radiofrequency rhizolysis or intraspinal procedures pending response to present treatment and F/U evaluation   Patient to call [facility] Center should patient have concerns prior to scheduled return appointment.

## 2015-07-24 NOTE — Patient Instructions (Signed)
PLAN   Continue present medication hydrocodone acetaminophen   F/U PCP Dr.Babaoff   for evaliation of  BP and general medical  condition  F/U surgical evaluation with Dr. Jeral FruitBotero  F/U neurological evaluation. May consider pending follow-up evaluations  May consider radiofrequency rhizolysis or intraspinal procedures pending response to present treatment and F/U evaluation

## 2015-08-03 ENCOUNTER — Emergency Department: Payer: Medicare Other

## 2015-08-03 ENCOUNTER — Inpatient Hospital Stay (HOSPITAL_COMMUNITY)
Admission: AD | Admit: 2015-08-03 | Discharge: 2015-08-22 | DRG: 085 | Disposition: A | Discharge status: Rehabilitation Facility | Payer: Medicare Other | Source: Other Acute Inpatient Hospital | Attending: Internal Medicine | Admitting: Internal Medicine

## 2015-08-03 ENCOUNTER — Emergency Department
Admission: EM | Admit: 2015-08-03 | Discharge: 2015-08-03 | Disposition: A | Discharge status: Other Acute Inpatient Hospital | Payer: Medicare Other | Attending: Student | Admitting: Student

## 2015-08-03 DIAGNOSIS — D509 Iron deficiency anemia, unspecified: Secondary | ICD-10-CM | POA: Diagnosis present

## 2015-08-03 DIAGNOSIS — E873 Alkalosis: Secondary | ICD-10-CM | POA: Diagnosis not present

## 2015-08-03 DIAGNOSIS — K219 Gastro-esophageal reflux disease without esophagitis: Secondary | ICD-10-CM | POA: Diagnosis present

## 2015-08-03 DIAGNOSIS — I447 Left bundle-branch block, unspecified: Secondary | ICD-10-CM | POA: Diagnosis present

## 2015-08-03 DIAGNOSIS — L03113 Cellulitis of right upper limb: Secondary | ICD-10-CM | POA: Diagnosis not present

## 2015-08-03 DIAGNOSIS — R0602 Shortness of breath: Secondary | ICD-10-CM

## 2015-08-03 DIAGNOSIS — I130 Hypertensive heart and chronic kidney disease with heart failure and stage 1 through stage 4 chronic kidney disease, or unspecified chronic kidney disease: Secondary | ICD-10-CM | POA: Diagnosis present

## 2015-08-03 DIAGNOSIS — J9621 Acute and chronic respiratory failure with hypoxia: Secondary | ICD-10-CM | POA: Diagnosis not present

## 2015-08-03 DIAGNOSIS — E878 Other disorders of electrolyte and fluid balance, not elsewhere classified: Secondary | ICD-10-CM | POA: Diagnosis not present

## 2015-08-03 DIAGNOSIS — N179 Acute kidney failure, unspecified: Secondary | ICD-10-CM | POA: Diagnosis not present

## 2015-08-03 DIAGNOSIS — I5021 Acute systolic (congestive) heart failure: Secondary | ICD-10-CM | POA: Diagnosis not present

## 2015-08-03 DIAGNOSIS — L0291 Cutaneous abscess, unspecified: Secondary | ICD-10-CM | POA: Diagnosis not present

## 2015-08-03 DIAGNOSIS — N189 Chronic kidney disease, unspecified: Secondary | ICD-10-CM | POA: Diagnosis present

## 2015-08-03 DIAGNOSIS — G9340 Encephalopathy, unspecified: Secondary | ICD-10-CM | POA: Diagnosis present

## 2015-08-03 DIAGNOSIS — Y998 Other external cause status: Secondary | ICD-10-CM | POA: Diagnosis not present

## 2015-08-03 DIAGNOSIS — S069X3S Unspecified intracranial injury with loss of consciousness of 1 hour to 5 hours 59 minutes, sequela: Secondary | ICD-10-CM | POA: Diagnosis not present

## 2015-08-03 DIAGNOSIS — Z7982 Long term (current) use of aspirin: Secondary | ICD-10-CM

## 2015-08-03 DIAGNOSIS — W19XXXA Unspecified fall, initial encounter: Secondary | ICD-10-CM | POA: Diagnosis present

## 2015-08-03 DIAGNOSIS — S066X0A Traumatic subarachnoid hemorrhage without loss of consciousness, initial encounter: Secondary | ICD-10-CM | POA: Diagnosis present

## 2015-08-03 DIAGNOSIS — Z79899 Other long term (current) drug therapy: Secondary | ICD-10-CM | POA: Diagnosis not present

## 2015-08-03 DIAGNOSIS — I82403 Acute embolism and thrombosis of unspecified deep veins of lower extremity, bilateral: Secondary | ICD-10-CM | POA: Diagnosis not present

## 2015-08-03 DIAGNOSIS — S0003XA Contusion of scalp, initial encounter: Secondary | ICD-10-CM | POA: Diagnosis not present

## 2015-08-03 DIAGNOSIS — L8990 Pressure ulcer of unspecified site, unspecified stage: Secondary | ICD-10-CM | POA: Diagnosis not present

## 2015-08-03 DIAGNOSIS — I959 Hypotension, unspecified: Secondary | ICD-10-CM | POA: Diagnosis not present

## 2015-08-03 DIAGNOSIS — L02429 Furuncle of limb, unspecified: Secondary | ICD-10-CM | POA: Diagnosis not present

## 2015-08-03 DIAGNOSIS — I469 Cardiac arrest, cause unspecified: Secondary | ICD-10-CM | POA: Diagnosis not present

## 2015-08-03 DIAGNOSIS — R402142 Coma scale, eyes open, spontaneous, at arrival to emergency department: Secondary | ICD-10-CM | POA: Diagnosis present

## 2015-08-03 DIAGNOSIS — W228XXA Striking against or struck by other objects, initial encounter: Secondary | ICD-10-CM

## 2015-08-03 DIAGNOSIS — K625 Hemorrhage of anus and rectum: Secondary | ICD-10-CM | POA: Diagnosis present

## 2015-08-03 DIAGNOSIS — F329 Major depressive disorder, single episode, unspecified: Secondary | ICD-10-CM | POA: Diagnosis present

## 2015-08-03 DIAGNOSIS — I82413 Acute embolism and thrombosis of femoral vein, bilateral: Secondary | ICD-10-CM | POA: Diagnosis not present

## 2015-08-03 DIAGNOSIS — R52 Pain, unspecified: Secondary | ICD-10-CM | POA: Diagnosis not present

## 2015-08-03 DIAGNOSIS — Z6832 Body mass index (BMI) 32.0-32.9, adult: Secondary | ICD-10-CM

## 2015-08-03 DIAGNOSIS — I10 Essential (primary) hypertension: Secondary | ICD-10-CM | POA: Insufficient documentation

## 2015-08-03 DIAGNOSIS — J9601 Acute respiratory failure with hypoxia: Secondary | ICD-10-CM | POA: Diagnosis present

## 2015-08-03 DIAGNOSIS — I429 Cardiomyopathy, unspecified: Secondary | ICD-10-CM | POA: Diagnosis present

## 2015-08-03 DIAGNOSIS — R402252 Coma scale, best verbal response, oriented, at arrival to emergency department: Secondary | ICD-10-CM | POA: Diagnosis present

## 2015-08-03 DIAGNOSIS — I472 Ventricular tachycardia: Secondary | ICD-10-CM | POA: Diagnosis not present

## 2015-08-03 DIAGNOSIS — Y9301 Activity, walking, marching and hiking: Secondary | ICD-10-CM | POA: Insufficient documentation

## 2015-08-03 DIAGNOSIS — S0012XA Contusion of left eyelid and periocular area, initial encounter: Secondary | ICD-10-CM | POA: Diagnosis not present

## 2015-08-03 DIAGNOSIS — R64 Cachexia: Secondary | ICD-10-CM | POA: Diagnosis present

## 2015-08-03 DIAGNOSIS — W01198A Fall on same level from slipping, tripping and stumbling with subsequent striking against other object, initial encounter: Secondary | ICD-10-CM | POA: Insufficient documentation

## 2015-08-03 DIAGNOSIS — D62 Acute posthemorrhagic anemia: Secondary | ICD-10-CM | POA: Diagnosis not present

## 2015-08-03 DIAGNOSIS — S069X4S Unspecified intracranial injury with loss of consciousness of 6 hours to 24 hours, sequela: Secondary | ICD-10-CM | POA: Diagnosis not present

## 2015-08-03 DIAGNOSIS — I5022 Chronic systolic (congestive) heart failure: Secondary | ICD-10-CM | POA: Diagnosis not present

## 2015-08-03 DIAGNOSIS — R739 Hyperglycemia, unspecified: Secondary | ICD-10-CM | POA: Diagnosis present

## 2015-08-03 DIAGNOSIS — E876 Hypokalemia: Secondary | ICD-10-CM | POA: Diagnosis not present

## 2015-08-03 DIAGNOSIS — I420 Dilated cardiomyopathy: Secondary | ICD-10-CM | POA: Diagnosis not present

## 2015-08-03 DIAGNOSIS — S069X1S Unspecified intracranial injury with loss of consciousness of 30 minutes or less, sequela: Secondary | ICD-10-CM | POA: Diagnosis not present

## 2015-08-03 DIAGNOSIS — I609 Nontraumatic subarachnoid hemorrhage, unspecified: Secondary | ICD-10-CM

## 2015-08-03 DIAGNOSIS — T17890A Other foreign object in other parts of respiratory tract causing asphyxiation, initial encounter: Secondary | ICD-10-CM | POA: Diagnosis not present

## 2015-08-03 DIAGNOSIS — L02423 Furuncle of right upper limb: Secondary | ICD-10-CM | POA: Diagnosis not present

## 2015-08-03 DIAGNOSIS — J690 Pneumonitis due to inhalation of food and vomit: Secondary | ICD-10-CM | POA: Diagnosis not present

## 2015-08-03 DIAGNOSIS — A499 Bacterial infection, unspecified: Secondary | ICD-10-CM | POA: Diagnosis not present

## 2015-08-03 DIAGNOSIS — R402362 Coma scale, best motor response, obeys commands, at arrival to emergency department: Secondary | ICD-10-CM | POA: Diagnosis present

## 2015-08-03 DIAGNOSIS — I619 Nontraumatic intracerebral hemorrhage, unspecified: Secondary | ICD-10-CM | POA: Insufficient documentation

## 2015-08-03 DIAGNOSIS — I4891 Unspecified atrial fibrillation: Secondary | ICD-10-CM | POA: Diagnosis not present

## 2015-08-03 DIAGNOSIS — Y92481 Parking lot as the place of occurrence of the external cause: Secondary | ICD-10-CM | POA: Diagnosis not present

## 2015-08-03 DIAGNOSIS — I509 Heart failure, unspecified: Secondary | ICD-10-CM

## 2015-08-03 DIAGNOSIS — L89622 Pressure ulcer of left heel, stage 2: Secondary | ICD-10-CM | POA: Diagnosis present

## 2015-08-03 DIAGNOSIS — E870 Hyperosmolality and hypernatremia: Secondary | ICD-10-CM | POA: Diagnosis present

## 2015-08-03 DIAGNOSIS — J9600 Acute respiratory failure, unspecified whether with hypoxia or hypercapnia: Secondary | ICD-10-CM

## 2015-08-03 DIAGNOSIS — R1310 Dysphagia, unspecified: Secondary | ICD-10-CM | POA: Diagnosis not present

## 2015-08-03 DIAGNOSIS — I6200 Nontraumatic subdural hemorrhage, unspecified: Secondary | ICD-10-CM | POA: Diagnosis not present

## 2015-08-03 DIAGNOSIS — Z4659 Encounter for fitting and adjustment of other gastrointestinal appliance and device: Secondary | ICD-10-CM

## 2015-08-03 DIAGNOSIS — I427 Cardiomyopathy due to drug and external agent: Secondary | ICD-10-CM | POA: Diagnosis not present

## 2015-08-03 DIAGNOSIS — I808 Phlebitis and thrombophlebitis of other sites: Secondary | ICD-10-CM | POA: Diagnosis not present

## 2015-08-03 DIAGNOSIS — I471 Supraventricular tachycardia: Secondary | ICD-10-CM | POA: Diagnosis not present

## 2015-08-03 DIAGNOSIS — J14 Pneumonia due to Hemophilus influenzae: Secondary | ICD-10-CM | POA: Diagnosis not present

## 2015-08-03 DIAGNOSIS — E875 Hyperkalemia: Secondary | ICD-10-CM | POA: Diagnosis not present

## 2015-08-03 DIAGNOSIS — I82402 Acute embolism and thrombosis of unspecified deep veins of left lower extremity: Secondary | ICD-10-CM | POA: Diagnosis not present

## 2015-08-03 DIAGNOSIS — S065X0A Traumatic subdural hemorrhage without loss of consciousness, initial encounter: Secondary | ICD-10-CM | POA: Insufficient documentation

## 2015-08-03 DIAGNOSIS — I129 Hypertensive chronic kidney disease with stage 1 through stage 4 chronic kidney disease, or unspecified chronic kidney disease: Secondary | ICD-10-CM | POA: Diagnosis present

## 2015-08-03 DIAGNOSIS — S06361A Traumatic hemorrhage of cerebrum, unspecified, with loss of consciousness of 30 minutes or less, initial encounter: Secondary | ICD-10-CM

## 2015-08-03 DIAGNOSIS — J810 Acute pulmonary edema: Secondary | ICD-10-CM | POA: Diagnosis not present

## 2015-08-03 DIAGNOSIS — Z0189 Encounter for other specified special examinations: Secondary | ICD-10-CM

## 2015-08-03 DIAGNOSIS — T8509XA Other mechanical complication of ventricular intracranial (communicating) shunt, initial encounter: Secondary | ICD-10-CM

## 2015-08-03 DIAGNOSIS — S065XAA Traumatic subdural hemorrhage with loss of consciousness status unknown, initial encounter: Secondary | ICD-10-CM | POA: Diagnosis present

## 2015-08-03 DIAGNOSIS — R7989 Other specified abnormal findings of blood chemistry: Secondary | ICD-10-CM | POA: Diagnosis not present

## 2015-08-03 DIAGNOSIS — S065X9A Traumatic subdural hemorrhage with loss of consciousness of unspecified duration, initial encounter: Secondary | ICD-10-CM

## 2015-08-03 DIAGNOSIS — S0990XA Unspecified injury of head, initial encounter: Secondary | ICD-10-CM | POA: Diagnosis present

## 2015-08-03 DIAGNOSIS — I82401 Acute embolism and thrombosis of unspecified deep veins of right lower extremity: Secondary | ICD-10-CM | POA: Diagnosis not present

## 2015-08-03 DIAGNOSIS — N390 Urinary tract infection, site not specified: Secondary | ICD-10-CM | POA: Diagnosis not present

## 2015-08-03 LAB — COMPREHENSIVE METABOLIC PANEL
ALBUMIN: 3.4 g/dL — AB (ref 3.5–5.0)
ALK PHOS: 69 U/L (ref 38–126)
ALT: 12 U/L — ABNORMAL LOW (ref 14–54)
ALT: 14 U/L (ref 14–54)
ANION GAP: 8 (ref 5–15)
ANION GAP: 11 (ref 5–15)
AST: 30 U/L (ref 15–41)
AST: 32 U/L (ref 15–41)
Albumin: 3.3 g/dL — ABNORMAL LOW (ref 3.5–5.0)
Alkaline Phosphatase: 75 U/L (ref 38–126)
BUN: 25 mg/dL — ABNORMAL HIGH (ref 6–20)
BUN: 22 mg/dL — ABNORMAL HIGH (ref 6–20)
CALCIUM: 8.5 mg/dL — AB (ref 8.9–10.3)
CHLORIDE: 103 mmol/L (ref 101–111)
CHLORIDE: 105 mmol/L (ref 101–111)
CO2: 25 mmol/L (ref 22–32)
CO2: 20 mmol/L — AB (ref 22–32)
Calcium: 8.5 mg/dL — ABNORMAL LOW (ref 8.9–10.3)
Creatinine, Ser: 1.86 mg/dL — ABNORMAL HIGH (ref 0.44–1.00)
Creatinine, Ser: 1.88 mg/dL — ABNORMAL HIGH (ref 0.44–1.00)
GFR calc non Af Amer: 23 mL/min — ABNORMAL LOW (ref >60)
GFR calc non Af Amer: 23 mL/min — ABNORMAL LOW (ref >60)
GFR, EST AFRICAN AMERICAN: 27 mL/min — AB (ref >60)
GFR, EST AFRICAN AMERICAN: 27 mL/min — AB (ref >60)
GLUCOSE: 119 mg/dL — AB (ref 65–99)
Glucose, Bld: 157 mg/dL — ABNORMAL HIGH (ref 65–99)
POTASSIUM: 4.1 mmol/L (ref 3.5–5.1)
POTASSIUM: 3.9 mmol/L (ref 3.5–5.1)
SODIUM: 136 mmol/L (ref 135–145)
SODIUM: 136 mmol/L (ref 135–145)
Total Bilirubin: 0.3 mg/dL (ref 0.3–1.2)
Total Bilirubin: 0.5 mg/dL (ref 0.3–1.2)
Total Protein: 7.3 g/dL (ref 6.5–8.1)
Total Protein: 6.6 g/dL (ref 6.5–8.1)

## 2015-08-03 LAB — CBC WITH DIFFERENTIAL/PLATELET
BASOS ABS: 0 10*3/uL (ref 0.0–0.1)
BASOS PCT: 1 %
Basophils Absolute: 0.1 10*3/uL (ref 0–0.1)
Basophils Relative: 0 %
EOS ABS: 0.1 10*3/uL (ref 0–0.7)
EOS ABS: 0 10*3/uL (ref 0.0–0.7)
EOS PCT: 1 %
EOS PCT: 0 %
HCT: 30 % — ABNORMAL LOW (ref 35.0–47.0)
HCT: 30.3 % — ABNORMAL LOW (ref 36.0–46.0)
HEMOGLOBIN: 9.7 g/dL — AB (ref 12.0–16.0)
Hemoglobin: 9.7 g/dL — ABNORMAL LOW (ref 12.0–15.0)
Lymphocytes Relative: 10 %
Lymphocytes Relative: 5 %
Lymphs Abs: 1.3 10*3/uL (ref 1.0–3.6)
Lymphs Abs: 0.8 10*3/uL (ref 0.7–4.0)
MCH: 29.7 pg (ref 26.0–34.0)
MCH: 29.6 pg (ref 26.0–34.0)
MCHC: 32.2 g/dL (ref 32.0–36.0)
MCHC: 32 g/dL (ref 30.0–36.0)
MCV: 92 fL (ref 80.0–100.0)
MCV: 92.4 fL (ref 78.0–100.0)
MONOS PCT: 7 %
Monocytes Absolute: 0.9 10*3/uL (ref 0.2–0.9)
Monocytes Absolute: 0.8 10*3/uL (ref 0.1–1.0)
Monocytes Relative: 5 %
NEUTROS PCT: 81 %
Neutro Abs: 10.6 10*3/uL — ABNORMAL HIGH (ref 1.4–6.5)
Neutro Abs: 14 10*3/uL — ABNORMAL HIGH (ref 1.7–7.7)
Neutrophils Relative %: 90 %
PLATELETS: 222 10*3/uL (ref 150–440)
PLATELETS: 225 10*3/uL (ref 150–400)
RBC: 3.26 MIL/uL — ABNORMAL LOW (ref 3.80–5.20)
RBC: 3.28 MIL/uL — AB (ref 3.87–5.11)
RDW: 15.4 % — AB (ref 11.5–14.5)
RDW: 14.9 % (ref 11.5–15.5)
WBC: 13 10*3/uL — ABNORMAL HIGH (ref 3.6–11.0)
WBC: 15.7 10*3/uL — AB (ref 4.0–10.5)

## 2015-08-03 LAB — APTT
APTT: 39 s — AB (ref 24–36)
APTT: 42 s — AB (ref 24–37)

## 2015-08-03 LAB — MRSA PCR SCREENING: MRSA by PCR: NEGATIVE

## 2015-08-03 LAB — PROTIME-INR
INR: 1.28
INR: 1.2 (ref 0.00–1.49)
PROTHROMBIN TIME: 16.1 s — AB (ref 11.4–15.0)
PROTHROMBIN TIME: 15.4 s — AB (ref 11.6–15.2)

## 2015-08-03 LAB — TROPONIN I: Troponin I: <0.03 ng/mL (ref <0.031)

## 2015-08-03 MED ORDER — SODIUM CHLORIDE 0.9 % IJ SOLN: 3.0000 mL | INTRAMUSCULAR | Status: DC | PRN

## 2015-08-03 MED ORDER — POTASSIUM CHLORIDE IN NACL 20-0.9 MEQ/L-% IV SOLN
INTRAVENOUS | Status: DC
  Administered 2015-08-03 – 2015-08-05 (×4): via INTRAVENOUS
  Administered 2015-08-06: 80 mL/h via INTRAVENOUS
  Administered 2015-08-06: 20:00:00 via INTRAVENOUS
  Administered 2015-08-07: 80 mL/h via INTRAVENOUS
  Administered 2015-08-08: 02:00:00 via INTRAVENOUS
  Filled 2015-08-03 (×11): qty 1000

## 2015-08-03 MED ORDER — ONDANSETRON HCL 4 MG/2ML IJ SOLN
4.0000 mg | Freq: Four times a day (QID) | INTRAMUSCULAR | Status: DC | PRN
  Administered 2015-08-04: 4 mg via INTRAVENOUS
  Filled 2015-08-03 (×3): qty 2

## 2015-08-03 MED ORDER — ONDANSETRON HCL 4 MG/2ML IJ SOLN
4.0000 mg | Freq: Once | INTRAMUSCULAR | Status: AC
  Administered 2015-08-03: 4 mg via INTRAVENOUS

## 2015-08-03 MED ORDER — ATENOLOL 50 MG PO TABS
50.0000 mg | ORAL_TABLET | Freq: Every day | ORAL | Status: DC
  Filled 2015-08-03: qty 1

## 2015-08-03 MED ORDER — HYDROCHLOROTHIAZIDE 25 MG PO TABS
25.0000 mg | ORAL_TABLET | Freq: Every day | ORAL | Status: DC
  Filled 2015-08-03: qty 1

## 2015-08-03 MED ORDER — ONDANSETRON HCL 4 MG/2ML IJ SOLN
INTRAMUSCULAR | Status: AC
  Administered 2015-08-03: 4 mg via INTRAVENOUS
  Filled 2015-08-03: qty 2

## 2015-08-03 MED ORDER — SODIUM CHLORIDE 0.9 % IJ SOLN
3.0000 mL | Freq: Two times a day (BID) | INTRAMUSCULAR | Status: DC
  Administered 2015-08-03 – 2015-08-08 (×10): 3 mL via INTRAVENOUS

## 2015-08-03 MED ORDER — SODIUM CHLORIDE 0.9 % IV SOLN: 250.0000 mL | INTRAVENOUS | Status: DC | PRN

## 2015-08-03 MED ORDER — HYDROCODONE-ACETAMINOPHEN 5-325 MG PO TABS
1.0000 | ORAL_TABLET | Freq: Four times a day (QID) | ORAL | Status: DC | PRN
  Filled 2015-08-03: qty 1

## 2015-08-03 MED ORDER — CITALOPRAM HYDROBROMIDE 40 MG PO TABS
40.0000 mg | ORAL_TABLET | Freq: Every day | ORAL | Status: DC
  Filled 2015-08-03 (×2): qty 1

## 2015-08-03 MED ORDER — CALCIUM CARBONATE-VITAMIN D 500-200 MG-UNIT PO TABS
1.0000 | ORAL_TABLET | Freq: Three times a day (TID) | ORAL | Status: DC
  Administered 2015-08-08 – 2015-08-22 (×34): 1 via ORAL
  Filled 2015-08-03 (×62): qty 1

## 2015-08-03 MED ORDER — IRBESARTAN 300 MG PO TABS: 150.0000 mg | ORAL_TABLET | Freq: Every day | ORAL | Status: DC

## 2015-08-03 MED ORDER — DIPHENHYDRAMINE HCL 50 MG/ML IJ SOLN
50.0000 mg | Freq: Once | INTRAMUSCULAR | Status: AC
  Administered 2015-08-03: 50 mg via INTRAVENOUS
  Filled 2015-08-03: qty 1

## 2015-08-03 MED ORDER — ACETAMINOPHEN 650 MG RE SUPP
650.0000 mg | Freq: Four times a day (QID) | RECTAL | Status: DC | PRN
  Administered 2015-08-09 – 2015-08-17 (×4): 650 mg via RECTAL
  Filled 2015-08-03 (×4): qty 1

## 2015-08-03 MED ORDER — PANTOPRAZOLE SODIUM 40 MG PO TBEC
40.0000 mg | DELAYED_RELEASE_TABLET | Freq: Every day | ORAL | Status: DC
  Filled 2015-08-03: qty 1

## 2015-08-03 MED ORDER — ICAPS AREDS 2 PO CAPS: 1.0000 | ORAL_CAPSULE | Freq: Two times a day (BID) | ORAL | Status: DC

## 2015-08-03 MED ORDER — ONDANSETRON HCL 4 MG PO TABS
4.0000 mg | ORAL_TABLET | Freq: Four times a day (QID) | ORAL | Status: DC | PRN
Start: 2015-08-03 — End: 2015-08-16
  Filled 2015-08-03: qty 1

## 2015-08-03 MED ORDER — SODIUM CHLORIDE 0.9 % IV SOLN: Freq: Once | INTRAVENOUS | Status: DC

## 2015-08-03 MED ORDER — OCUVITE-LUTEIN PO CAPS
1.0000 | ORAL_CAPSULE | Freq: Two times a day (BID) | ORAL | Status: DC
  Administered 2015-08-08 – 2015-08-22 (×22): 1 via ORAL
  Filled 2015-08-03 (×42): qty 1

## 2015-08-03 MED ORDER — RALOXIFENE HCL 60 MG PO TABS
60.0000 mg | ORAL_TABLET | Freq: Every day | ORAL | Status: DC
  Filled 2015-08-03 (×6): qty 1

## 2015-08-03 MED ORDER — ACETAMINOPHEN 325 MG PO TABS
650.0000 mg | ORAL_TABLET | Freq: Four times a day (QID) | ORAL | Status: DC | PRN
  Administered 2015-08-18 – 2015-08-22 (×4): 650 mg via ORAL
  Filled 2015-08-03 (×4): qty 2

## 2015-08-03 MED ORDER — PROCHLORPERAZINE 25 MG RE SUPP
25.0000 mg | Freq: Two times a day (BID) | RECTAL | Status: DC | PRN
  Administered 2015-08-03 – 2015-08-04 (×2): 25 mg via RECTAL
  Filled 2015-08-03 (×4): qty 1

## 2015-08-03 NOTE — ED Notes (Signed)
Patient transported to CT 

## 2015-08-03 NOTE — ED Notes (Signed)
Patient arrived by [facility] with complaint of possible syncopal event onto concrete walk face first. Arrived alert but confused to events surrounding fall.

## 2015-08-03 NOTE — Progress Notes (Signed)
Paged MD regarding Nausea/comiting and confusion and agitation. MD came to bedside, noted that pt had been going between periods of calm and agitation at [facility]. Orders received.

## 2015-08-03 NOTE — ED Notes (Signed)
[place] his number is  [phone]

## 2015-08-03 NOTE — ED Notes (Signed)
Consuella LoseElaine RN given report on [address] [place]

## 2015-08-03 NOTE — H&P (Signed)
Lisa [name] is an 81 y.o. female.  She took fall while walking in a parking lot in an unorganized manner. Chief Complaint: subdural hematoma, subarachnoid hemorrhage HPI: After the fall she was brought to [facility], head ct showed intracranial blood. No evidence of an operative lesion. She had a waxing and waning exam. Transferred for further evaluation.   Past Medical History  Diagnosis Date  . Allergy   . Pelvis fracture (HCC)   . Wrist fracture, right   . Depression   . Spinal stenosis   . Osteoarthritis   . Hypertension   . Foot fracture     bilateral    Past Surgical History  Procedure Laterality Date  . Back surgery N/A   . Breast lumpectomy N/A     Family History  Problem Relation Age of Onset  . COPD Mother   . COPD Father   . Heart disease Father    Social History:  reports that she has never smoked. She has never used smokeless tobacco. She reports that she does not drink alcohol or use illicit drugs.  Allergies:  Allergies  Allergen Reactions  . Ambien [Zolpidem Tartrate] Other (See Comments)    Reaction:  Hallucinations   . Oxycodone Nausea And Vomiting  . Shellfish Allergy Swelling and Other (See Comments)    Pts mouth and face swells.   . Ace Inhibitors Cough  . Daypro [Oxaprozin] Other (See Comments)    Reaction:  GI upset   . Norvasc [Amlodipine Besylate] Palpitations    Facility-administered medications prior to admission  Medication Dose Route Frequency Provider Last Rate Last Dose  . lactated ringers infusion 1,000 mL  1,000 mL Intravenous Continuous Ewing Schlein, MD       Medications Prior to Admission  Medication Sig Dispense Refill  . aspirin EC 81 MG tablet Take 81 mg by mouth daily.    [name] atenolol (TENORMIN) 50 MG tablet Take 50 mg by mouth daily.     . calcium-vitamin D (OSCAL WITH D) 500-200 MG-UNIT tablet Take 1 tablet by mouth 3 (three) times daily.    . cefUROXime (CEFTIN) 250 MG tablet Take 1 tablet (250 mg total) by mouth 2 (two) times  daily with a meal. (Patient not taking: Reported on 05/22/2015) 14 tablet 0  . cefUROXime (CEFTIN) 250 MG tablet Take 1 tablet (250 mg total) by mouth 2 (two) times daily with a meal. (Patient not taking: Reported on 06/26/2015) 14 tablet 0  . cefUROXime (CEFTIN) 250 MG tablet Take 1 tablet (250 mg total) by mouth 2 (two) times daily with a meal. (Patient not taking: Reported on 07/16/2015) 14 tablet 0  . cholecalciferol (VITAMIN D) 1000 UNITS tablet Take 1,000 Units by mouth daily.    . citalopram (CELEXA) 40 MG tablet Take 40 mg by mouth daily.    . hydrochlorothiazide (HYDRODIURIL) 25 MG tablet Take 25 mg by mouth daily.    [name] HYDROcodone-acetaminophen (NORCO/VICODIN) 5-325 MG tablet Take 1 tablet by mouth 4 (four) times daily as needed for moderate pain.    [name] irbesartan (AVAPRO) 300 MG tablet Take 150 mg by mouth daily.     . Multiple Vitamins-Minerals (ICAPS AREDS 2) CAPS Take 1 capsule by mouth 2 (two) times daily.     [name] omeprazole (PRILOSEC) 20 MG capsule Take 20 mg by mouth daily.    . raloxifene (EVISTA) 60 MG tablet Take 60 mg by mouth daily.      Results for orders placed or performed during the hospital  encounter of 08/03/15 (from the past 48 hour(s))  CBC WITH DIFFERENTIAL     Status: Abnormal   Collection Time: 08/03/15  8:22 PM  Result Value Ref Range   WBC 15.7 (H) 4.0 - 10.5 K/uL   RBC 3.28 (L) 3.87 - 5.11 MIL/uL   Hemoglobin 9.7 (L) 12.0 - 15.0 g/dL   HCT 40.9 (L) 81.1 - 91.4 %   MCV 92.4 78.0 - 100.0 fL   MCH 29.6 26.0 - 34.0 pg   MCHC 32.0 30.0 - 36.0 g/dL   RDW 78.2 95.6 - 21.3 %   Platelets 225 150 - 400 K/uL   Neutrophils Relative % 90 %   Neutro Abs 14.0 (H) 1.7 - 7.7 K/uL   Lymphocytes Relative 5 %   Lymphs Abs 0.8 0.7 - 4.0 K/uL   Monocytes Relative 5 %   Monocytes Absolute 0.8 0.1 - 1.0 K/uL   Eosinophils Relative 0 %   Eosinophils Absolute 0.0 0.0 - 0.7 K/uL   Basophils Relative 0 %   Basophils Absolute 0.0 0.0 - 0.1 K/uL  APTT     Status: Abnormal    Collection Time: 08/03/15  8:22 PM  Result Value Ref Range   aPTT 42 (H) 24 - 37 seconds    Comment:        IF BASELINE aPTT IS ELEVATED, SUGGEST PATIENT RISK ASSESSMENT BE USED TO DETERMINE APPROPRIATE ANTICOAGULANT THERAPY.   Protime-INR     Status: Abnormal   Collection Time: 08/03/15  8:22 PM  Result Value Ref Range   Prothrombin Time 15.4 (H) 11.6 - 15.2 seconds   INR 1.20 0.00 - 1.49   Ct Head Wo Contrast  08/03/2015  CLINICAL DATA:  Larey Seat today with trauma to that head, face and neck. Possible syncopal event falling to the concrete landing on face. Alert but confused. EXAM: CT HEAD WITHOUT CONTRAST CT MAXILLOFACIAL WITHOUT CONTRAST CT CERVICAL SPINE WITHOUT CONTRAST TECHNIQUE: Multidetector CT imaging of the head, cervical spine, and maxillofacial structures were performed using the standard protocol without intravenous contrast. Multiplanar CT image reconstructions of the cervical spine and maxillofacial structures were also generated. COMPARISON:  None. FINDINGS: CT HEAD FINDINGS There is diffuse subarachnoid hemorrhage particularly in the sulci overlying the frontal regions. There is acute subdural hematoma along the right convexity measuring 5 mm in diameter. There is acute subdural hematoma along the left tentorium approximately that same thickness. Area of density on the right on image 14 in the frontoparietal junction region could represent hemorrhage within a deep sulcus, with the possibility of early brain contusion or stroke is not excluded at this time. No skull fracture. No evidence of old infarction. No accelerated atrophy. No hydrocephalus. No shift. No sign of tumor. CT MAXILLOFACIAL FINDINGS There is fluid within both maxillary sinuses. There are a few opacified ethmoid air cells. Frontal and sphenoid sinuses are clear. No fluid in the middle ears or mastoids. No facial fracture. There is soft tissue swelling in the orbital regions but no evidence of globe injury or postseptal  orbital injury. CT CERVICAL SPINE FINDINGS No cervical spine fracture. There is facet arthropathy on the right at C2-3, C3-4, C4-5 and C5-6 and on the left to a lesser degree at those levels. There is degenerative anterolisthesis of a few mm at C2-3, C3-4, C4-5, C5-6 and C7-T1. Extensive pleural and parenchymal density is noted at both lung apices, not primarily evaluated and presumably chronic. IMPRESSION: Head CT: Acute traumatic subarachnoid hemorrhage, most prominent along the frontal convexities. 5  mm thick acute subdural hematoma along the right convexity and along the left tentorium. Density in the right frontoparietal junction region of the brain that could represent subarachnoid hemorrhage within a deep sulcus, though the possibility of brain contusion or stroke is not excluded at this time. Facial CT: No facial fracture seen fluid in the maxillary sinuses presumed secondary to sinusitis or nose bleed. Cervical spine CT: No acute or traumatic finding. Chronic degenerative changes. Critical Value/emergent results were called by telephone at the time of interpretation on 08/03/2015 at 5:00 pm to Dr. Toney Rakes , who verbally acknowledged these results. Electronically Signed   By: Paulina Fusi M.D.   On: 08/03/2015 17:11   Ct Cervical Spine Wo Contrast  08/03/2015  CLINICAL DATA:  Larey Seat today with trauma to that head, face and neck. Possible syncopal event falling to the concrete landing on face. Alert but confused. EXAM: CT HEAD WITHOUT CONTRAST CT MAXILLOFACIAL WITHOUT CONTRAST CT CERVICAL SPINE WITHOUT CONTRAST TECHNIQUE: Multidetector CT imaging of the head, cervical spine, and maxillofacial structures were performed using the standard protocol without intravenous contrast. Multiplanar CT image reconstructions of the cervical spine and maxillofacial structures were also generated. COMPARISON:  None. FINDINGS: CT HEAD FINDINGS There is diffuse subarachnoid hemorrhage particularly in the sulci overlying  the frontal regions. There is acute subdural hematoma along the right convexity measuring 5 mm in diameter. There is acute subdural hematoma along the left tentorium approximately that same thickness. Area of density on the right on image 14 in the frontoparietal junction region could represent hemorrhage within a deep sulcus, with the possibility of early brain contusion or stroke is not excluded at this time. No skull fracture. No evidence of old infarction. No accelerated atrophy. No hydrocephalus. No shift. No sign of tumor. CT MAXILLOFACIAL FINDINGS There is fluid within both maxillary sinuses. There are a few opacified ethmoid air cells. Frontal and sphenoid sinuses are clear. No fluid in the middle ears or mastoids. No facial fracture. There is soft tissue swelling in the orbital regions but no evidence of globe injury or postseptal orbital injury. CT CERVICAL SPINE FINDINGS No cervical spine fracture. There is facet arthropathy on the right at C2-3, C3-4, C4-5 and C5-6 and on the left to a lesser degree at those levels. There is degenerative anterolisthesis of a few mm at C2-3, C3-4, C4-5, C5-6 and C7-T1. Extensive pleural and parenchymal density is noted at both lung apices, not primarily evaluated and presumably chronic. IMPRESSION: Head CT: Acute traumatic subarachnoid hemorrhage, most prominent along the frontal convexities. 5 mm thick acute subdural hematoma along the right convexity and along the left tentorium. Density in the right frontoparietal junction region of the brain that could represent subarachnoid hemorrhage within a deep sulcus, though the possibility of brain contusion or stroke is not excluded at this time. Facial CT: No facial fracture seen fluid in the maxillary sinuses presumed secondary to sinusitis or nose bleed. Cervical spine CT: No acute or traumatic finding. Chronic degenerative changes. Critical Value/emergent results were called by telephone at the time of interpretation on  08/03/2015 at 5:00 pm to Dr. Toney Rakes , who verbally acknowledged these results. Electronically Signed   By: Paulina Fusi M.D.   On: 08/03/2015 17:11   Ct Maxillofacial Wo Cm  08/03/2015  CLINICAL DATA:  Larey Seat today with trauma to that head, face and neck. Possible syncopal event falling to the concrete landing on face. Alert but confused. EXAM: CT HEAD WITHOUT CONTRAST CT MAXILLOFACIAL WITHOUT CONTRAST CT  CERVICAL SPINE WITHOUT CONTRAST TECHNIQUE: Multidetector CT imaging of the head, cervical spine, and maxillofacial structures were performed using the standard protocol without intravenous contrast. Multiplanar CT image reconstructions of the cervical spine and maxillofacial structures were also generated. COMPARISON:  None. FINDINGS: CT HEAD FINDINGS There is diffuse subarachnoid hemorrhage particularly in the sulci overlying the frontal regions. There is acute subdural hematoma along the right convexity measuring 5 mm in diameter. There is acute subdural hematoma along the left tentorium approximately that same thickness. Area of density on the right on image 14 in the frontoparietal junction region could represent hemorrhage within a deep sulcus, with the possibility of early brain contusion or stroke is not excluded at this time. No skull fracture. No evidence of old infarction. No accelerated atrophy. No hydrocephalus. No shift. No sign of tumor. CT MAXILLOFACIAL FINDINGS There is fluid within both maxillary sinuses. There are a few opacified ethmoid air cells. Frontal and sphenoid sinuses are clear. No fluid in the middle ears or mastoids. No facial fracture. There is soft tissue swelling in the orbital regions but no evidence of globe injury or postseptal orbital injury. CT CERVICAL SPINE FINDINGS No cervical spine fracture. There is facet arthropathy on the right at C2-3, C3-4, C4-5 and C5-6 and on the left to a lesser degree at those levels. There is degenerative anterolisthesis of a few mm at C2-3,  C3-4, C4-5, C5-6 and C7-T1. Extensive pleural and parenchymal density is noted at both lung apices, not primarily evaluated and presumably chronic. IMPRESSION: Head CT: Acute traumatic subarachnoid hemorrhage, most prominent along the frontal convexities. 5 mm thick acute subdural hematoma along the right convexity and along the left tentorium. Density in the right frontoparietal junction region of the brain that could represent subarachnoid hemorrhage within a deep sulcus, though the possibility of brain contusion or stroke is not excluded at this time. Facial CT: No facial fracture seen fluid in the maxillary sinuses presumed secondary to sinusitis or nose bleed. Cervical spine CT: No acute or traumatic finding. Chronic degenerative changes. Critical Value/emergent results were called by telephone at the time of interpretation on 08/03/2015 at 5:00 pm to Dr. Toney Rakes , who verbally acknowledged these results. Electronically Signed   By: Paulina Fusi M.D.   On: 08/03/2015 17:11    Review of Systems  Unable to perform ROS: mental acuity    There were no vitals taken for this visit. Physical Exam  Constitutional: She appears well-developed and well-nourished. No distress.  HENT:  Nose: Nose normal.  Mouth/Throat: Oropharynx is clear and moist. No oropharyngeal exudate.  Edematous ecchymotic periorbita bruising, L>>R  Eyes: Conjunctivae and EOM are normal. Pupils are equal, round, and reactive to light.  Neck: Normal range of motion. Neck supple.  Cardiovascular: Normal rate, regular rhythm and normal heart sounds.   Respiratory: Effort normal and breath sounds normal.  GI: Soft. Bowel sounds are normal.  Musculoskeletal: Normal range of motion.  Neurological: She is alert. She has normal strength. She displays no atrophy and no tremor. No cranial nerve deficit. She exhibits normal muscle tone. She displays no seizure activity. GCS eye subscore is 4. GCS verbal subscore is 5. GCS motor subscore  is 6. She displays no Babinski's sign on the right side. She displays no Babinski's sign on the left side.  Unable to perform sensory exam due to mental status. Confused, non fluent Oriented to place Waxing, waning mental exam. Will follow commands Is moving all extremities Gait not assessed  Assessment/Plan Admit for observation. Repeat head ct tomorrow.   Jaunita Mikels L 08/03/2015, 9:08 PM

## 2015-08-03 NOTE — ED Provider Notes (Signed)
[facility] Emergency Department Provider Note  ____________________________________________  Time seen: Approximately 4:13 PM  I have reviewed the triage vital signs and the nursing notes.   HISTORY  Chief Complaint Fall  Caveat-history of present illness and review of systems limited secondary to the patient's confusion, amnesia regarding today's events. Information obtained from EMS on arrival.  HPI Lisa [name] is a 81 y.o. female with history of hypertension, GERD on 81 mg of aspirin daily presents for evaluation of a p.m. fall today which occurred suddenly just prior to arrival. According to EMS, the patient was witnessed to be walking "weakly" and was noted to fall in a parking lot. She struck her head. She has no recollection of what happened and no other history is obtained. Currently complaining of headache but no other pain complaints.   Past Medical History  Diagnosis Date  . Allergy   . Pelvis fracture (HCC)   . Wrist fracture, right   . Depression   . Spinal stenosis   . Osteoarthritis   . Hypertension   . Foot fracture     bilateral    Patient Active Problem List   Diagnosis Date Noted  . Lumbar radiculopathy 06/06/2015  . Spinal stenosis, lumbar region, with neurogenic claudication 04/02/2015  . Degenerative lumbar disc 02/15/2015  . Lumbar post-laminectomy syndrome 02/15/2015  . Facet syndrome, lumbar 02/15/2015  . Sacroiliac joint dysfunction 02/15/2015    Past Surgical History  Procedure Laterality Date  . Back surgery N/A   . Breast lumpectomy N/A     Current Outpatient Rx  Name  Route  Sig  Dispense  Refill  . aspirin EC 81 MG tablet   Oral   Take 81 mg by mouth daily.         [name] atenolol (TENORMIN) 50 MG tablet   Oral   Take 50 mg by mouth daily.          . calcium-vitamin D (OSCAL WITH D) 500-200 MG-UNIT tablet   Oral   Take 1 tablet by mouth 3 (three) times daily.         . cholecalciferol (VITAMIN D)  1000 UNITS tablet   Oral   Take 1,000 Units by mouth daily.         . citalopram (CELEXA) 40 MG tablet   Oral   Take 40 mg by mouth daily.         . hydrochlorothiazide (HYDRODIURIL) 25 MG tablet   Oral   Take 25 mg by mouth daily.         [name] HYDROcodone-acetaminophen (NORCO/VICODIN) 5-325 MG tablet   Oral   Take 1 tablet by mouth 4 (four) times daily as needed for moderate pain.         [name] irbesartan (AVAPRO) 300 MG tablet   Oral   Take 150 mg by mouth daily.          . Multiple Vitamins-Minerals (ICAPS AREDS 2) CAPS   Oral   Take 1 capsule by mouth 2 (two) times daily.          [name] omeprazole (PRILOSEC) 20 MG capsule   Oral   Take 20 mg by mouth daily.         . raloxifene (EVISTA) 60 MG tablet   Oral   Take 60 mg by mouth daily.         . cefUROXime (CEFTIN) 250 MG tablet   Oral   Take 1 tablet (250 mg total) by mouth 2 (  two) times daily with a meal. Patient not taking: Reported on 05/22/2015   14 tablet   0   . cefUROXime (CEFTIN) 250 MG tablet   Oral   Take 1 tablet (250 mg total) by mouth 2 (two) times daily with a meal. Patient not taking: Reported on 06/26/2015   14 tablet   0   . cefUROXime (CEFTIN) 250 MG tablet   Oral   Take 1 tablet (250 mg total) by mouth 2 (two) times daily with a meal. Patient not taking: Reported on 07/16/2015   14 tablet   0     Allergies Ambien; Oxycodone; Shellfish allergy; Ace inhibitors; Daypro; and Norvasc  Family History  Problem Relation Age of Onset  . COPD Mother   . COPD Father   . Heart disease Father     Social History Social History  Substance Use Topics  . Smoking status: Never Smoker   . Smokeless tobacco: Never Used  . Alcohol Use: No    Review of Systems  Neurological: + headache  Caveat-history of present illness and review of systems limited secondary to the patient's confusion, amnesia regarding today's events. Information obtained from EMS on  arrival. ____________________________________________   PHYSICAL EXAM:  Filed Vitals:   08/03/15 1613 08/03/15 1700 08/03/15 1700 08/03/15 1730  BP: 156/76 167/65 167/65 165/66  Pulse: 98 80 80 80  Temp: 99.1 F (37.3 C)     TempSrc: Oral     Resp:  26 16 21   Height: 5\' 3"  (1.6 m)     Weight: 145 lb (65.772 kg)     SpO2: 99% 96% 96% 97%     Constitutional: Awake and alert, oriented to self only. Actively vomiting. For the most part, answers questions appropriately and follows commands but then devolves into confused speech briefly and returns to normal. Eyes: Moderate right periorbital edema. Severe left periorbital edema with ecchymosis. When the eyelids are retracted, pupils are equally reactive to light and extraocular movements are intact.  Head: Scalp hematoma left scalp Nose: No congestion/rhinnorhea. Mouth/Throat: Mucous membranes are moist.  Oropharynx non-erythematous. Neck: No stridor.   Cardiovascular: Normal rate, regular rhythm. Grossly normal heart sounds.  Good peripheral circulation. Respiratory: Normal respiratory effort.  No retractions. Lungs CTAB. Gastrointestinal: Soft and nontender. No distention.  No CVA tenderness. Genitourinary: deferred Musculoskeletal: No lower extremity tenderness nor edema.  No joint effusions. No midline tenderness to palpation throughout the T or L-spine. Pelvis is stable to rock and compression. Full painless range of motion of bilateral hip joints. Neurologic:  5 out of 5 strength in bilateral upper and lower extremities. Sensation intact to light touch throughout.  Skin:  Skin is warm, dry and intact. No rash noted. Psychiatric: Mood and affect are normal. Speech and behavior are normal.  ____________________________________________   LABS (all labs ordered are listed, but only abnormal results are displayed)  Labs Reviewed  CBC WITH DIFFERENTIAL/PLATELET - Abnormal; Notable for the following:    WBC 13.0 (*)    RBC 3.26  (*)    Hemoglobin 9.7 (*)    HCT 30.0 (*)    RDW 15.4 (*)    Neutro Abs 10.6 (*)    All other components within normal limits  COMPREHENSIVE METABOLIC PANEL - Abnormal; Notable for the following:    Glucose, Bld 119 (*)    BUN 25 (*)    Creatinine, Ser 1.86 (*)    Calcium 8.5 (*)    Albumin 3.4 (*)    ALT 12 (*)  GFR calc non Af Amer 23 (*)    GFR calc Af Amer 27 (*)    All other components within normal limits  APTT - Abnormal; Notable for the following:    aPTT 39 (*)    All other components within normal limits  PROTIME-INR - Abnormal; Notable for the following:    Prothrombin Time 16.1 (*)    All other components within normal limits  TROPONIN I  URINALYSIS COMPLETEWITH MICROSCOPIC ([facility] ONLY)   ____________________________________________  EKG  ED ECG REPORT I, Gayla Doss, the attending physician, personally viewed and interpreted this ECG.   Date: 08/03/2015  EKG Time: 16:08  Rate: 100  Rhythm: sinus tachycardia  Axis: normal  Intervals:left bundle branch block, chronic when compared to EKG on 10/31/2013  ST&T Change: No acute ST elevation  ____________________________________________  RADIOLOGY  CT head, c-spine, maxillofacial  IMPRESSION: Head CT: Acute traumatic subarachnoid hemorrhage, most prominent along the frontal convexities.  5 mm thick acute subdural hematoma along the right convexity and along the left tentorium.  Density in the right frontoparietal junction region of the brain that could represent subarachnoid hemorrhage within a deep sulcus, though the possibility of brain contusion or stroke is not excluded at this time.  Facial CT: No facial fracture seen fluid in the maxillary sinuses presumed secondary to sinusitis or nose bleed.  Cervical spine CT: No acute or traumatic finding. Chronic degenerative changes.  ____________________________________________   PROCEDURES  Procedure(s) performed: None  Critical Care  performed: Yes, see critical care note(s). Total critical care time spent 40 minutes   ____________________________________________   INITIAL IMPRESSION / ASSESSMENT AND PLAN / ED COURSE  Pertinent labs & imaging results that were available during my care of the patient were reviewed by me and considered in my medical decision making (see chart for details).  Lisa [name] is a 81 y.o. female with history of hypertension, GERD on 81 mg of aspirin daily presents for evaluation of a p.m. fall today which occurred suddenly just prior to arrival. on exam, she is actively retching, she has severe swelling associated with both eyes, a scalp hematoma on the left. She isn't intact motor and sensory exam. In terms of her level of consciousness, she for the most part is alert, can make it appropriately, follows commands but has brief periods of time where she devolves into confused speech and then recovers her normal speech quickly. Plan for screening labs, CT head/c-spine and maxillofacial, EKG chest x-ray, likely admission.   ----------------------------------------- 5:30 PM on 08/03/2015 ----------------------------------------- CT scan shows subarachnoid hemorrhage as well as subdural hemorrhage. Patient with unchanged neurological exam. I discussed the case with Dr. Franky Macho of [facility] neurosurgery who has accepted the patient as transfer. Labs reviewed are notable for mild leukocytosis, mild anemia which is chronic. Mild creatinine elevation 1.86. Will give light IV fluids. INR 1.28  ____________________________________________   FINAL CLINICAL IMPRESSION(S) / ED DIAGNOSES  Final diagnoses:  Subdural hemorrhage (HCC)  Subarachnoid bleed (HCC)      Gayla Doss, MD 08/03/15 2142

## 2015-08-03 NOTE — ED Notes (Signed)
Spoke with susie stone patients close friend, she will come to visit patient

## 2015-08-04 ENCOUNTER — Inpatient Hospital Stay (HOSPITAL_COMMUNITY): Payer: Medicare Other

## 2015-08-04 MED ORDER — CHLORHEXIDINE GLUCONATE 0.12 % MT SOLN
15.0000 mL | Freq: Two times a day (BID) | OROMUCOSAL | Status: DC
  Administered 2015-08-04 – 2015-08-08 (×9): 15 mL via OROMUCOSAL
  Filled 2015-08-04 (×6): qty 15

## 2015-08-04 MED ORDER — CITALOPRAM HYDROBROMIDE 20 MG PO TABS
20.0000 mg | ORAL_TABLET | Freq: Every day | ORAL | Status: DC
  Filled 2015-08-04 (×4): qty 1

## 2015-08-04 MED ORDER — CETYLPYRIDINIUM CHLORIDE 0.05 % MT LIQD
7.0000 mL | Freq: Two times a day (BID) | OROMUCOSAL | Status: DC
  Administered 2015-08-04 – 2015-08-08 (×10): 7 mL via OROMUCOSAL

## 2015-08-04 MED ORDER — PNEUMOCOCCAL VAC POLYVALENT 25 MCG/0.5ML IJ INJ
0.5000 mL | INJECTION | INTRAMUSCULAR | Status: AC
  Administered 2015-08-05: 0.5 mL via INTRAMUSCULAR
  Filled 2015-08-04: qty 0.5

## 2015-08-04 MED ORDER — MIDAZOLAM 5 MG/ML PEDIATRIC INJ FOR INTRANASAL/SUBLINGUAL USE
5.0000 mg | INTRAMUSCULAR | Status: DC | PRN
  Administered 2015-08-04 – 2015-08-09 (×10): 5 mg via NASAL
  Filled 2015-08-04 (×11): qty 1

## 2015-08-04 MED ORDER — MORPHINE SULFATE (PF) 2 MG/ML IV SOLN
2.0000 mg | INTRAVENOUS | Status: DC | PRN
  Administered 2015-08-04 (×3): 2 mg via INTRAVENOUS
  Administered 2015-08-04 – 2015-08-06 (×5): 4 mg via INTRAVENOUS
  Administered 2015-08-06: 2 mg via INTRAVENOUS
  Administered 2015-08-07 – 2015-08-08 (×4): 4 mg via INTRAVENOUS
  Filled 2015-08-04: qty 2
  Filled 2015-08-04: qty 1
  Filled 2015-08-04 (×3): qty 2
  Filled 2015-08-04: qty 1
  Filled 2015-08-04 (×3): qty 2
  Filled 2015-08-04: qty 1
  Filled 2015-08-04: qty 2
  Filled 2015-08-04: qty 1
  Filled 2015-08-04 (×2): qty 2

## 2015-08-04 MED ORDER — INFLUENZA VAC SPLIT QUAD 0.5 ML IM SUSY
0.5000 mL | PREFILLED_SYRINGE | INTRAMUSCULAR | Status: AC
  Administered 2015-08-05: 0.5 mL via INTRAMUSCULAR
  Filled 2015-08-04: qty 0.5

## 2015-08-04 NOTE — Progress Notes (Signed)
Head ct shows multiple bilateral frontal cerebral contusions. Will carefully follow sodium.

## 2015-08-04 NOTE — Plan of Care (Signed)
Problem: Safety: Goal: Ability to remain free from injury will improve Outcome: Progressing Safety sitter provided due to confusion/agitation from patient, inability to maintain safety on own.

## 2015-08-04 NOTE — Progress Notes (Signed)
Paged MD regarding agitation and high heart rate. Order for restraint belt received.

## 2015-08-04 NOTE — Progress Notes (Signed)
Spoke with MD regardign patient assessment and presentation as nonverbal. MD expected change.

## 2015-08-04 NOTE — Progress Notes (Signed)
Patient ID: Lisa [name], female   DOB: 06/16/1927, 81 y.o.   MRN: [id number] BP 178/86 mmHg  Pulse 153  Temp(Src) 96.7 F (35.9 C) (Axillary)  Resp 33  SpO2 90% Not following commands Confused Moving all extremities Awaiting head CT.

## 2015-08-05 ENCOUNTER — Inpatient Hospital Stay (HOSPITAL_COMMUNITY): Payer: Medicare Other

## 2015-08-05 DIAGNOSIS — S065X0A Traumatic subdural hemorrhage without loss of consciousness, initial encounter: Secondary | ICD-10-CM | POA: Diagnosis not present

## 2015-08-05 LAB — SODIUM: SODIUM: 145 mmol/L (ref 135–145)

## 2015-08-05 NOTE — Progress Notes (Signed)
Patient ID: Lisa [name], female   DOB: 07/15/1927, 81 y.o.   MRN: [id number] BP 126/56 mmHg  Pulse 91  Temp(Src) 101.1 F (38.4 C) (Axillary)  Resp 23  SpO2 92% Agitated at times perrl Not following commands this am,  Head ct stable Moving all extremities Would expect improvement over the next few days

## 2015-08-06 MED ORDER — PANTOPRAZOLE SODIUM 40 MG IV SOLR
40.0000 mg | Freq: Every day | INTRAVENOUS | Status: DC
  Administered 2015-08-06 – 2015-08-07 (×2): 40 mg via INTRAVENOUS
  Filled 2015-08-06 (×2): qty 40

## 2015-08-06 MED ORDER — PANTOPRAZOLE SODIUM 40 MG PO TBEC: 40.0000 mg | DELAYED_RELEASE_TABLET | Freq: Every day | ORAL | Status: DC

## 2015-08-06 NOTE — Care Management Note (Addendum)
Case Management Note  Patient Details  Name: Lisa [name] MRN: [id number] Date of Birth: 12/11/1926  Subjective/Objective:    Pt admitted on 08/03/15 s/p fall with subarachnoid hemorrhage.  PTA, pt independent of ADLS, lives alone.   Son, at bedside; lives in [place].  He states pt has several good friends and cousins in town.                 Action/Plan: Will follow for discharge planning as pt progresses.  Will need PT/OT consults when able to tolerate therapies.    Expected Discharge Date:                  Expected Discharge Plan:  Skilled Nursing Facility  In-House Referral:  Clinical Social Work  Discharge planning Services  CM Consult  Post Acute Care Choice:    Choice offered to:     DME Arranged:    DME Agency:     HH Arranged:    HH Agency:     Status of Service:  In process, will continue to follow  Medicare Important Message Given:    Date Medicare IM Given:    Medicare IM give by:    Date Additional Medicare IM Given:    Additional Medicare Important Message give by:     If discussed at Long Length of Stay Meetings, dates discussed:    Additional Comments:  Quintella BatonJulie W. Abdulaziz Toman, RN, BSN  Trauma/Neuro ICU Case Manager [phone]

## 2015-08-06 NOTE — Progress Notes (Signed)
Patient ID: Marita SnellenFaye K Davidovich, female   DOB: 04/30/1927, 81 y.o.   MRN: [id number] BP 153/75 mmHg  Pulse 94  Temp(Src) 98.3 F (36.8 C) (Axillary)  Resp 38  SpO2 99% Lethargic, not following commands Non verbal Pupils responsive to light Symmetric grimace to noxious stimuli Localizing Will check sodium again tomorrow.

## 2015-08-07 ENCOUNTER — Inpatient Hospital Stay (HOSPITAL_COMMUNITY): Payer: Medicare Other

## 2015-08-07 LAB — POTASSIUM: Potassium: 5.1 mmol/L (ref 3.5–5.1)

## 2015-08-07 LAB — GLUCOSE, CAPILLARY: GLUCOSE-CAPILLARY: 168 mg/dL — AB (ref 65–99)

## 2015-08-07 LAB — SODIUM: Sodium: 151 mmol/L — ABNORMAL HIGH (ref 135–145)

## 2015-08-07 MED ORDER — JEVITY 1.2 CAL PO LIQD
1000.0000 mL | ORAL | Status: DC
  Administered 2015-08-07 – 2015-08-08 (×2): 1000 mL
  Filled 2015-08-07 (×4): qty 1000

## 2015-08-07 MED ORDER — PRO-STAT SUGAR FREE PO LIQD
30.0000 mL | Freq: Every day | ORAL | Status: DC
  Administered 2015-08-08 – 2015-08-09 (×2): 30 mL
  Filled 2015-08-07 (×2): qty 30

## 2015-08-07 NOTE — Evaluation (Signed)
Physical Therapy Evaluation Patient Details Name: Lisa [name] MRN: [id number] DOB: 09/08/1927 Today's Date: 08/07/2015   History of Present Illness  pt presents after a fall in a parking lot sustaining Bil Frontal Cerebral Contusions.  pt with hx of HTN, Bil "Foot" fxs, Pelvic fxs, R Wrist fx, Back surgeries, and Depression.    Clinical Impression  Pt lethargic and not participating in bed mobility at this time.  Pt maintains L eye closed, but will open R eye when stimulated and when in sitting.  Pt not following directions and not responding to son's voice while sitting EOB.  VSS throughout session.  Pending pt's improvement in arousal feel she will need intense rehab at the CIR level to maximize independence.  Will continue to follow.      Follow Up Recommendations CIR    Equipment Recommendations  None recommended by PT    Recommendations for Other Services Rehab consult     Precautions / Restrictions Precautions Precautions: Fall Restrictions Weight Bearing Restrictions: No      Mobility  Bed Mobility Overal bed mobility: Needs Assistance;+2 for physical assistance Bed Mobility: Supine to Sit;Sit to Supine     Supine to sit: Total assist;+2 for physical assistance Sit to supine: Total assist;+2 for physical assistance   General bed mobility comments: pt actively moves UEs, but does not participate in bed mobility.    Transfers Overall transfer level: Needs assistance Equipment used: 2 person hand held assist Transfers: Sit to/from Stand Sit to Stand: Max assist;+2 physical assistance         General transfer comment: Repeated transfer x2 with pt demonstrating hyperextension in Bil LEs needing Bil feet blocked and pad under hips to A with trunk extension.    Ambulation/Gait                Stairs            Wheelchair Mobility    Modified Rankin (Stroke Patients Only)       Balance Overall balance assessment: Needs assistance;History of  Falls Sitting-balance support: Bilateral upper extremity supported;Feet supported Sitting balance-Leahy Scale: Poor Sitting balance - Comments: pt with intermittently push with L UE more than R UE.  Needs Mod - Max A to maintain balance.   Postural control: Posterior lean Standing balance support: No upper extremity supported;During functional activity Standing balance-Leahy Scale: Zero                               Pertinent Vitals/Pain Pain Assessment: Faces Faces Pain Scale: No hurt Pain Location: No verbalizations and no grimacing.    Home Living Family/patient expects to be discharged to:: Inpatient rehab                      Prior Function Level of Independence: Independent         Comments: pt lives alone and does her own driving.       Hand Dominance   Dominant Hand: Left    Extremity/Trunk Assessment   Upper Extremity Assessment: Defer to OT evaluation           Lower Extremity Assessment: Generalized weakness;Difficult to assess due to impaired cognition      Cervical / Trunk Assessment: Kyphotic  Communication   Communication:  (No verbalizations throughout session.)  Cognition Arousal/Alertness: Lethargic Behavior During Therapy: Flat affect Overall Cognitive Status: Difficult to assess  General Comments      Exercises        Assessment/Plan    PT Assessment Patient needs continued PT services  PT Diagnosis Difficulty walking;Generalized weakness;Altered mental status   PT Problem List Decreased strength;Decreased activity tolerance;Decreased balance;Decreased mobility;Decreased coordination;Decreased cognition;Decreased knowledge of use of DME;Decreased safety awareness  PT Treatment Interventions DME instruction;Gait training;Functional mobility training;Therapeutic activities;Therapeutic exercise;Balance training;Neuromuscular re-education;Cognitive remediation;Patient/family education    PT Goals (Current goals can be found in the Care Plan section) Acute Rehab PT Goals Patient Stated Goal: Per son for pt to walk and talk again.   PT Goal Formulation: With family Time For Goal Achievement: 08/21/15 Potential to Achieve Goals: Good    Frequency Min 3X/week   Barriers to discharge        Co-evaluation               End of Session Equipment Utilized During Treatment: Gait belt;Oxygen Activity Tolerance: Patient limited by lethargy Patient left: in bed;with call bell/phone within reach;with family/visitor present;with nursing/sitter in room Nurse Communication: Mobility status;Need for lift equipment         Time: [phone] PT Time Calculation (min) (ACUTE ONLY): 28 min   Charges:   PT Evaluation $Initial PT Evaluation Tier I: 1 Procedure PT Treatments $Therapeutic Activity: 8-22 mins   PT G CodesSunny Schlein:        Fines Kimberlin F, [place]PT [phone] 08/07/2015, 9:46 AM

## 2015-08-07 NOTE — Progress Notes (Signed)
Initial Nutrition Assessment   INTERVENTION:   Initiate Jevity 1.2 @ 50 ml/hr via post-pyloric Cortrak tube    30 ml Prostat daily  Tube feeding regimen provides 1540 kcal (100% of needs), 81 grams of protein, and 972 ml of H2O.    NUTRITION DIAGNOSIS:   Inadequate oral intake related to inability to eat as evidenced by NPO status.  GOAL:   Patient will meet greater than or equal to 90% of their needs  MONITOR:   Labs, Weight trends, TF tolerance, I & O's  REASON FOR ASSESSMENT:   Consult Enteral/tube feeding initiation and management  ASSESSMENT:   Pt admitted on 08/03/15 s/p fall with subarachnoid hemorrhage. Pt has remained lethargic and unable to eat.  Medications reviewed and include: MVI, Calcium with Vitamin D Labs reviewed: sodium elevated (151) Cortrak placed, tip at ligament of Treitz  Nutrition-Focused physical exam completed. Findings are no fat depletion, no muscle depletion, and no edema.    Diet Order:  Diet NPO time specified  Skin:  Reviewed, no issues  Last BM:  11/14  Height:   Ht Readings from Last 1 Encounters:  08/03/15 5\' 3"  (1.6 m)   Weight:   Wt Readings from Last 1 Encounters:  08/03/15 145 lb (65.772 kg)   Ideal Body Weight:  52.2 kg  BMI:  There is no weight on file to calculate BMI.  Estimated Nutritional Needs:   Kcal:  1400-1600  Protein:  70-80 grams  Fluid:  > 1.5 L/day  EDUCATION NEEDS:   No education needs identified at this time  Kendell BaneHeather Andrey Mccaskill RD, LDN, CNSC [phone] Pager [phone] After Hours Pager

## 2015-08-07 NOTE — Care Management Important Message (Signed)
Important Message  Patient Details  Name: Lisa SnellenFaye K [name] MRN: [id number] Date of Birth: 10/25/1926   Medicare Important Message Given:  Yes    Lisa [name] 08/07/2015, 1:04 PM

## 2015-08-07 NOTE — Progress Notes (Signed)
Inpatient Rehabilitation  We have been asked by PT  to screen pt. for possible IP Rehab .  We would recommend consult as pt. becomes more attentive and able to participate in therapies.  Please order at that time if you agree.  Thanks.  Weldon PickingSusan Kathrina Crosley PT Inpatient Rehab Admissions Coordinator Cell [phone] Office [phone]

## 2015-08-07 NOTE — Progress Notes (Signed)
Patient ID: Lisa SnellenFaye K Valdivia, female   DOB: 08/14/1927, 81 y.o.   MRN: [id number] BP 156/82 mmHg  Pulse 93  Temp(Src) 98.6 F (37 C) (Axillary)  Resp 24  SpO2 99% Opening eyes to voice, tracking  Not following commands Moving all extremities Improving Continue nutrition, speech consult, continue pt/ot

## 2015-08-08 ENCOUNTER — Inpatient Hospital Stay (HOSPITAL_COMMUNITY): Payer: Medicare Other

## 2015-08-08 DIAGNOSIS — J690 Pneumonitis due to inhalation of food and vomit: Secondary | ICD-10-CM

## 2015-08-08 DIAGNOSIS — I469 Cardiac arrest, cause unspecified: Secondary | ICD-10-CM

## 2015-08-08 DIAGNOSIS — I472 Ventricular tachycardia: Secondary | ICD-10-CM

## 2015-08-08 DIAGNOSIS — J9601 Acute respiratory failure with hypoxia: Secondary | ICD-10-CM

## 2015-08-08 LAB — CBC
HEMATOCRIT: 27.1 % — AB (ref 36.0–46.0)
HEMOGLOBIN: 8.5 g/dL — AB (ref 12.0–15.0)
MCH: 30.2 pg (ref 26.0–34.0)
MCHC: 31.4 g/dL (ref 30.0–36.0)
MCV: 96.4 fL (ref 78.0–100.0)
Platelets: 216 10*3/uL (ref 150–400)
RBC: 2.81 MIL/uL — ABNORMAL LOW (ref 3.87–5.11)
RDW: 17 % — ABNORMAL HIGH (ref 11.5–15.5)
WBC: 14 10*3/uL — AB (ref 4.0–10.5)

## 2015-08-08 LAB — BLOOD GAS, ARTERIAL
Acid-base deficit: 12.2 mmol/L — ABNORMAL HIGH (ref 0.0–2.0)
BICARBONATE: 13 meq/L — AB (ref 20.0–24.0)
Drawn by: 301361
FIO2: 1
LHR: 14 {breaths}/min
MECHVT: 370 mL
O2 Saturation: 99.2 %
PATIENT TEMPERATURE: 98.6
PEEP: 5 cmH2O
PO2 ART: 306 mmHg — AB (ref 80.0–100.0)
TCO2: 13.8 mmol/L (ref 0–100)
pCO2 arterial: 26.9 mmHg — ABNORMAL LOW (ref 35.0–45.0)
pH, Arterial: 7.304 — ABNORMAL LOW (ref 7.350–7.450)

## 2015-08-08 LAB — GLUCOSE, CAPILLARY
GLUCOSE-CAPILLARY: 136 mg/dL — AB (ref 65–99)
GLUCOSE-CAPILLARY: 158 mg/dL — AB (ref 65–99)
GLUCOSE-CAPILLARY: 168 mg/dL — AB (ref 65–99)
GLUCOSE-CAPILLARY: 170 mg/dL — AB (ref 65–99)
GLUCOSE-CAPILLARY: 170 mg/dL — AB (ref 65–99)
Glucose-Capillary: 155 mg/dL — ABNORMAL HIGH (ref 65–99)

## 2015-08-08 LAB — BASIC METABOLIC PANEL
BUN: 62 mg/dL — ABNORMAL HIGH (ref 6–20)
CO2: 16 mmol/L — AB (ref 22–32)
Calcium: 8.5 mg/dL — ABNORMAL LOW (ref 8.9–10.3)
Creatinine, Ser: 1.82 mg/dL — ABNORMAL HIGH (ref 0.44–1.00)
GFR calc non Af Amer: 24 mL/min — ABNORMAL LOW (ref >60)
GFR, EST AFRICAN AMERICAN: 28 mL/min — AB (ref >60)
Glucose, Bld: 146 mg/dL — ABNORMAL HIGH (ref 65–99)
Potassium: 5.5 mmol/L — ABNORMAL HIGH (ref 3.5–5.1)
Sodium: 156 mmol/L — ABNORMAL HIGH (ref 135–145)

## 2015-08-08 LAB — TROPONIN I: Troponin I: 1.1 ng/mL (ref <0.031)

## 2015-08-08 MED ORDER — AMIODARONE HCL IN DEXTROSE 360-4.14 MG/200ML-% IV SOLN
60.0000 mg/h | INTRAVENOUS | Status: AC
  Administered 2015-08-08 (×2): 60 mg/h via INTRAVENOUS
  Filled 2015-08-08 (×2): qty 200

## 2015-08-08 MED ORDER — AMIODARONE IV BOLUS ONLY 150 MG/100ML
150.0000 mg | Freq: Once | INTRAVENOUS | Status: AC
  Administered 2015-08-08: 150 mg via INTRAVENOUS

## 2015-08-08 MED ORDER — SODIUM CHLORIDE 0.9 % IV SOLN
INTRAVENOUS | Status: DC
  Administered 2015-08-08: 09:00:00 via INTRAVENOUS

## 2015-08-08 MED ORDER — METOPROLOL TARTRATE 1 MG/ML IV SOLN
2.5000 mg | Freq: Four times a day (QID) | INTRAVENOUS | Status: DC
  Administered 2015-08-08 – 2015-08-17 (×30): 2.5 mg via INTRAVENOUS
  Filled 2015-08-08 (×31): qty 5

## 2015-08-08 MED ORDER — AMIODARONE HCL IN DEXTROSE 360-4.14 MG/200ML-% IV SOLN
30.0000 mg/h | INTRAVENOUS | Status: DC
  Administered 2015-08-09 (×2): 30 mg/h via INTRAVENOUS
  Administered 2015-08-10 (×3): 60 mg/h via INTRAVENOUS
  Administered 2015-08-11: 30 mg/h via INTRAVENOUS
  Administered 2015-08-11 (×2): 60 mg/h via INTRAVENOUS
  Administered 2015-08-12: 30 mg/h via INTRAVENOUS
  Filled 2015-08-08 (×22): qty 200

## 2015-08-08 MED ORDER — MIDAZOLAM HCL 2 MG/2ML IJ SOLN
INTRAMUSCULAR | Status: AC
  Filled 2015-08-08: qty 4

## 2015-08-08 MED ORDER — METOPROLOL TARTRATE 1 MG/ML IV SOLN
5.0000 mg | Freq: Once | INTRAVENOUS | Status: AC
  Administered 2015-08-08: 5 mg via INTRAVENOUS

## 2015-08-08 MED ORDER — MIDAZOLAM HCL 2 MG/2ML IJ SOLN
2.0000 mg | Freq: Once | INTRAMUSCULAR | Status: AC
  Administered 2015-08-08: 2 mg via INTRAVENOUS

## 2015-08-08 MED ORDER — INSULIN ASPART 100 UNIT/ML ~~LOC~~ SOLN
0.0000 [IU] | SUBCUTANEOUS | Status: DC
  Administered 2015-08-08 (×2): 2 [IU] via SUBCUTANEOUS
  Administered 2015-08-09: 1 [IU] via SUBCUTANEOUS
  Administered 2015-08-09 (×3): 2 [IU] via SUBCUTANEOUS
  Administered 2015-08-09 – 2015-08-10 (×4): 1 [IU] via SUBCUTANEOUS
  Administered 2015-08-10: 2 [IU] via SUBCUTANEOUS
  Administered 2015-08-10 – 2015-08-11 (×4): 1 [IU] via SUBCUTANEOUS
  Administered 2015-08-11: 2 [IU] via SUBCUTANEOUS
  Administered 2015-08-12: 1 [IU] via SUBCUTANEOUS
  Administered 2015-08-12: 2 [IU] via SUBCUTANEOUS
  Administered 2015-08-12 – 2015-08-13 (×6): 1 [IU] via SUBCUTANEOUS
  Administered 2015-08-13: 2 [IU] via SUBCUTANEOUS
  Administered 2015-08-13 (×3): 1 [IU] via SUBCUTANEOUS
  Administered 2015-08-14 (×3): 2 [IU] via SUBCUTANEOUS
  Administered 2015-08-14: 1 [IU] via SUBCUTANEOUS
  Administered 2015-08-15: 2 [IU] via SUBCUTANEOUS
  Administered 2015-08-15: 1 [IU] via SUBCUTANEOUS
  Administered 2015-08-15: 2 [IU] via SUBCUTANEOUS
  Administered 2015-08-15 – 2015-08-17 (×3): 1 [IU] via SUBCUTANEOUS
  Administered 2015-08-17 – 2015-08-20 (×5): 2 [IU] via SUBCUTANEOUS

## 2015-08-08 MED ORDER — PANTOPRAZOLE SODIUM 40 MG PO PACK
40.0000 mg | PACK | Freq: Every day | ORAL | Status: DC
  Administered 2015-08-08 – 2015-08-15 (×8): 40 mg
  Filled 2015-08-08 (×6): qty 20

## 2015-08-08 MED ORDER — FENTANYL CITRATE (PF) 100 MCG/2ML IJ SOLN
50.0000 ug | INTRAMUSCULAR | Status: DC | PRN
  Administered 2015-08-08 – 2015-08-13 (×10): 50 ug via INTRAVENOUS
  Filled 2015-08-08 (×11): qty 2

## 2015-08-08 MED ORDER — ANTISEPTIC ORAL RINSE SOLUTION (CORINZ)
7.0000 mL | OROMUCOSAL | Status: DC
  Administered 2015-08-08 – 2015-08-15 (×65): 7 mL via OROMUCOSAL

## 2015-08-08 MED ORDER — FENTANYL CITRATE (PF) 100 MCG/2ML IJ SOLN
50.0000 ug | INTRAMUSCULAR | Status: AC | PRN
  Administered 2015-08-12 (×3): 50 ug via INTRAVENOUS
  Filled 2015-08-08 (×2): qty 2

## 2015-08-08 MED ORDER — SODIUM CHLORIDE 0.9 % IV SOLN
1.5000 g | Freq: Two times a day (BID) | INTRAVENOUS | Status: DC
  Administered 2015-08-08 – 2015-08-12 (×10): 1.5 g via INTRAVENOUS
  Filled 2015-08-08 (×12): qty 1.5

## 2015-08-08 MED ORDER — ETOMIDATE 2 MG/ML IV SOLN
20.0000 mg | Freq: Once | INTRAVENOUS | Status: AC
  Administered 2015-08-08: 20 mg via INTRAVENOUS

## 2015-08-08 MED ORDER — CHLORHEXIDINE GLUCONATE 0.12% ORAL RINSE (MEDLINE KIT)
15.0000 mL | Freq: Two times a day (BID) | OROMUCOSAL | Status: DC
  Administered 2015-08-08 – 2015-08-15 (×14): 15 mL via OROMUCOSAL

## 2015-08-08 MED ORDER — METOPROLOL TARTRATE 1 MG/ML IV SOLN
2.5000 mg | INTRAVENOUS | Status: DC | PRN
  Administered 2015-08-09: 5 mg via INTRAVENOUS
  Administered 2015-08-09: 2.5 mg via INTRAVENOUS
  Administered 2015-08-09 – 2015-08-16 (×2): 5 mg via INTRAVENOUS
  Filled 2015-08-08 (×6): qty 5

## 2015-08-08 MED ORDER — FENTANYL CITRATE (PF) 100 MCG/2ML IJ SOLN
INTRAMUSCULAR | Status: AC
  Filled 2015-08-08: qty 4

## 2015-08-08 MED ORDER — FENTANYL CITRATE (PF) 100 MCG/2ML IJ SOLN
100.0000 ug | Freq: Once | INTRAMUSCULAR | Status: AC
Start: 2015-08-08 — End: 2015-08-08
  Administered 2015-08-08: 100 ug via INTRAVENOUS

## 2015-08-08 MED ORDER — AMIODARONE LOAD VIA INFUSION
150.0000 mg | Freq: Once | INTRAVENOUS | Status: AC
  Administered 2015-08-08: 150 mg via INTRAVENOUS
  Filled 2015-08-08: qty 83.34

## 2015-08-08 MED FILL — Medication: Qty: 1 | Status: AC

## 2015-08-08 NOTE — Progress Notes (Signed)
RT called to the room for intubation. Patient was intubated by Brett CanalesSteve Minor NP with RT assistance. RT will continue to monitor.

## 2015-08-08 NOTE — Consult Note (Signed)
CARDIOLOGY CONSULT NOTE   Patient ID: Lisa [name] MRN: [id number], DOB/AGE: 1927-04-22   Admit date: 08/03/2015 Date of Consult: 08/08/2015   Primary Physician: Rozanna Box, MD Primary Cardiologist: new  Pt. Profile  81 year old Caucasian female with past medical history of hypertension and GERD but no past cardiac history presented with subdural and subarchnoid hemorrhage after a fall. Since admission, her mental status still has not improve enough for her to follow command. On 11/6 AM, she had cardiac arrest. Trop 1.1.   Problem List  Past Medical History  Diagnosis Date  . Allergy   . Pelvis fracture (HCC)   . Wrist fracture, right   . Depression   . Spinal stenosis   . Osteoarthritis   . Hypertension   . Foot fracture     bilateral    Past Surgical History  Procedure Laterality Date  . Back surgery N/A   . Breast lumpectomy N/A      Allergies  Allergies  Allergen Reactions  . Ambien [Zolpidem Tartrate] Other (See Comments)    Reaction:  Hallucinations   . Oxycodone Nausea And Vomiting  . Shellfish Allergy Swelling and Other (See Comments)    Pts mouth and face swells.   . Ace Inhibitors Cough  . Daypro [Oxaprozin] Other (See Comments)    GI upset  . Norvasc [Amlodipine Besylate] Palpitations    HPI   The patient is a 81 year old Caucasian female with past medical history of hypertension and GERD but no past cardiac history. She takes low-dose aspirin at home. She had a fall and struck her head on 08/03/2015 and presented to [facility] where CT of the head confirmed both subarachnoid and subdural hematoma. She was subsequently transferred to [facility] and admitted under neurosurgery. Since admission, patient's mental status has improved slightly, however still does not follow command. Per patient's son, she was largely independent and was able to complete daily ADLs without any problem prior to the fall. Repeat CT of the head was done on 11/12 and  11/13 and showed no significant progression of bilateral delayed frontal intracerebral hematoma surrounding the edema, no significant increasing mass effect, continued improvement of right convexity acute subdural improvement in the scatter areas of subarachnoid. She was seen by physical therapy on 11/15 and felt to be a candidate for CIR, however further CIR candidacy was depended on her neurological improvement. On the last progress note by neurosurgery on 11/15, patient was able to open eyes to voice and tracking, however does not follow command. It seem she is likely going to have prolonged course of recovery if recovery at all.  In the morning of 11/16, she went into wide complex tachycardia and shocked twice and given one dose of 150 mg amiodarone and 5 mg Lopressor. She eventually had emergent intubation to protect airway, a large mucoid debris was suctioned out of her airway. She was felt to have acute respiratory failure with hypoxia. She was started on antibiotic and at rest. Resp culture was done. Laboratory finding after arrest showed sodium 156, potassium 5.5, chloride >130, creatinine 1.82, WBC 14.0, hemoglobin 8.5, troponin 1.10. EKG showed incomplete left bundle branch block, downsloping ST segment with TWI in lateral leads. Cardiology has been consulted for cardiac arrest with elevated troponin.    Inpatient Medications  . ampicillin-sulbactam (UNASYN) IV  1.5 g Intravenous Q12H  . antiseptic oral rinse  7 mL Mouth Rinse q12n4p  . calcium-vitamin D  1 tablet Oral TID  . chlorhexidine  15 mL Mouth Rinse BID  . feeding supplement (PRO-STAT SUGAR FREE 64)  30 mL Per Tube Daily  . insulin aspart  0-9 Units Subcutaneous 6 times per day  . multivitamin-lutein  1 capsule Oral BID  . pantoprazole sodium  40 mg Per Tube Q1200  . sodium chloride  3 mL Intravenous Q12H    Family History Family History  Problem Relation Age of Onset  . COPD Mother   . COPD Father   . Heart disease Father       Social History Social History   Social History  . Marital Status: Widowed    Spouse Name: N/A  . Number of Children: N/A  . Years of Education: N/A   Occupational History  . Not on file.   Social History Main Topics  . Smoking status: Never Smoker   . Smokeless tobacco: Never Used  . Alcohol Use: No  . Drug Use: No  . Sexual Activity: Not on file   Other Topics Concern  . Not on file   Social History Narrative     Review of Systems  Unable to obtain review of systems due to patient's current mental status, she is intubated and sedated.  Physical Exam  Blood pressure 149/90, pulse 102, temperature 99.2 F (37.3 C), temperature source Oral, resp. rate 42, height 5' (1.524 m), weight 152 lb 5.4 oz (69.1 kg), SpO2 99 %.  General: intubated and sedated Neuro: unable to assess neuro status HEENT: Normal  Neck: Supple without bruits or JVD. Lungs:  Intubated on ventilator. anterior exam CTA. Heart: RRR no s3, s4, or murmurs. Abdomen: Soft, non-distended, BS + x 4.  Extremities: No clubbing, cyanosis or edema. LE cold, unable to feel assess pulse.   Labs   Recent Labs  08/08/15 1125  TROPONINI 1.10*   Lab Results  Component Value Date   WBC 14.0* 08/08/2015   HGB 8.5* 08/08/2015   HCT 27.1* 08/08/2015   MCV 96.4 08/08/2015   PLT 216 08/08/2015    Recent Labs Lab 08/03/15 2022  08/08/15 1125  NA 136  < > 156*  K 3.9  < > 5.5*  CL 105  --  >130*  CO2 20*  --  16*  BUN 22*  --  62*  CREATININE 1.88*  --  1.82*  CALCIUM 8.5*  --  8.5*  PROT 6.6  --   --   BILITOT 0.5  --   --   ALKPHOS 75  --   --   ALT 14  --   --   AST 32  --   --   GLUCOSE 157*  --  146*  < > = values in this interval not displayed. No results found for: CHOL, HDL, LDLCALC, TRIG No results found for: DDIMER  Radiology/Studies  Ct Head Wo Contrast  08/05/2015  CLINICAL DATA:  Continued surveillance post head injury. Altered mental status. EXAM: CT HEAD WITHOUT CONTRAST  TECHNIQUE: Contiguous axial images were obtained from the base of the skull through the vertex without intravenous contrast. COMPARISON:  08/03/2015.  08/04/2015. FINDINGS: Moderate motion degradation. Some images were repeated. Overall study diagnostic. Continued improvement in the RIGHT convexity acute subdural, thickness now only 2 mm. Continued improvement in the scattered areas of subarachnoid hemorrhage over the convexity gyri. Slight increased prominence of posterior interhemispheric subdural, likely represents redistribution of existing hematoma. There could be trace layering intraventricular blood, better visualized on today's study, but probably not new. BILATERAL frontal intracerebral hematomas are  redemonstrated. No new hemorrhages are seen. Surrounding edema is more prominent, particularly on the RIGHT, but there is no significant increase in mass effect. BILATERAL layering fluid in the maxillary sinuses is redemonstrated. Slight improvement ethmoid sinus fluid. No mastoid fluid. IMPRESSION: No significant progression of the BILATERAL delayed frontal intracerebral hematomas surrounding edema is more prominent but there is no significant increase in mass effect. Continued improvement in the RIGHT convexity acute subdural improvement in the scattered areas of subarachnoid Electronically Signed   By: Elsie Stain M.D.   On: 08/05/2015 07:44   Ct Head Wo Contrast  08/04/2015  CLINICAL DATA:  Followup scan for intracranial hemorrhage. Patient is increasingly agitated. Subsequent encounter. EXAM: CT HEAD WITHOUT CONTRAST TECHNIQUE: Contiguous axial images were obtained from the base of the skull through the vertex without intravenous contrast. COMPARISON:  08/03/2015. FINDINGS: Worsening/new BILATERAL intracerebral hematomas affect the anterior inferior frontal regions, LEFT greater than RIGHT. LEFT frontal contusion measures up to 18 x 19 mm in cross-section with surrounding edema. Smaller 1 cm sized  contusions are seen on the RIGHT. Improved subarachnoid hemorrhage. Improved interhemispheric and LEFT tentorial hemorrhage. Improved RIGHT convexity subdural hemorrhage diameter measuring 4 mm instead of 5 mm. No significant BILATERAL maxillary sinus opacity, with air-fluid levels. No appreciable right-to-left shift. No skull fracture. IMPRESSION: BILATERAL delayed intracerebral hematomas have developed in both frontal lobes, LEFT greater than RIGHT. Findings discussed with on-call provider. Improved subarachnoid and subdural collections. Electronically Signed   By: Elsie Stain M.D.   On: 08/04/2015 09:37   Ct Head Wo Contrast  08/03/2015  CLINICAL DATA:  Larey Seat today with trauma to that head, face and neck. Possible syncopal event falling to the concrete landing on face. Alert but confused. EXAM: CT HEAD WITHOUT CONTRAST CT MAXILLOFACIAL WITHOUT CONTRAST CT CERVICAL SPINE WITHOUT CONTRAST TECHNIQUE: Multidetector CT imaging of the head, cervical spine, and maxillofacial structures were performed using the standard protocol without intravenous contrast. Multiplanar CT image reconstructions of the cervical spine and maxillofacial structures were also generated. COMPARISON:  None. FINDINGS: CT HEAD FINDINGS There is diffuse subarachnoid hemorrhage particularly in the sulci overlying the frontal regions. There is acute subdural hematoma along the right convexity measuring 5 mm in diameter. There is acute subdural hematoma along the left tentorium approximately that same thickness. Area of density on the right on image 14 in the frontoparietal junction region could represent hemorrhage within a deep sulcus, with the possibility of early brain contusion or stroke is not excluded at this time. No skull fracture. No evidence of old infarction. No accelerated atrophy. No hydrocephalus. No shift. No sign of tumor. CT MAXILLOFACIAL FINDINGS There is fluid within both maxillary sinuses. There are a few opacified ethmoid  air cells. Frontal and sphenoid sinuses are clear. No fluid in the middle ears or mastoids. No facial fracture. There is soft tissue swelling in the orbital regions but no evidence of globe injury or postseptal orbital injury. CT CERVICAL SPINE FINDINGS No cervical spine fracture. There is facet arthropathy on the right at C2-3, C3-4, C4-5 and C5-6 and on the left to a lesser degree at those levels. There is degenerative anterolisthesis of a few mm at C2-3, C3-4, C4-5, C5-6 and C7-T1. Extensive pleural and parenchymal density is noted at both lung apices, not primarily evaluated and presumably chronic. IMPRESSION: Head CT: Acute traumatic subarachnoid hemorrhage, most prominent along the frontal convexities. 5 mm thick acute subdural hematoma along the right convexity and along the left tentorium. Density in the right frontoparietal  junction region of the brain that could represent subarachnoid hemorrhage within a deep sulcus, though the possibility of brain contusion or stroke is not excluded at this time. Facial CT: No facial fracture seen fluid in the maxillary sinuses presumed secondary to sinusitis or nose bleed. Cervical spine CT: No acute or traumatic finding. Chronic degenerative changes. Critical Value/emergent results were called by telephone at the time of interpretation on 08/03/2015 at 5:00 pm to Dr. Toney Rakes , who verbally acknowledged these results. Electronically Signed   By: Paulina Fusi M.D.   On: 08/03/2015 17:11   Ct Cervical Spine Wo Contrast  08/03/2015  CLINICAL DATA:  Larey Seat today with trauma to that head, face and neck. Possible syncopal event falling to the concrete landing on face. Alert but confused. EXAM: CT HEAD WITHOUT CONTRAST CT MAXILLOFACIAL WITHOUT CONTRAST CT CERVICAL SPINE WITHOUT CONTRAST TECHNIQUE: Multidetector CT imaging of the head, cervical spine, and maxillofacial structures were performed using the standard protocol without intravenous contrast. Multiplanar CT image  reconstructions of the cervical spine and maxillofacial structures were also generated. COMPARISON:  None. FINDINGS: CT HEAD FINDINGS There is diffuse subarachnoid hemorrhage particularly in the sulci overlying the frontal regions. There is acute subdural hematoma along the right convexity measuring 5 mm in diameter. There is acute subdural hematoma along the left tentorium approximately that same thickness. Area of density on the right on image 14 in the frontoparietal junction region could represent hemorrhage within a deep sulcus, with the possibility of early brain contusion or stroke is not excluded at this time. No skull fracture. No evidence of old infarction. No accelerated atrophy. No hydrocephalus. No shift. No sign of tumor. CT MAXILLOFACIAL FINDINGS There is fluid within both maxillary sinuses. There are a few opacified ethmoid air cells. Frontal and sphenoid sinuses are clear. No fluid in the middle ears or mastoids. No facial fracture. There is soft tissue swelling in the orbital regions but no evidence of globe injury or postseptal orbital injury. CT CERVICAL SPINE FINDINGS No cervical spine fracture. There is facet arthropathy on the right at C2-3, C3-4, C4-5 and C5-6 and on the left to a lesser degree at those levels. There is degenerative anterolisthesis of a few mm at C2-3, C3-4, C4-5, C5-6 and C7-T1. Extensive pleural and parenchymal density is noted at both lung apices, not primarily evaluated and presumably chronic. IMPRESSION: Head CT: Acute traumatic subarachnoid hemorrhage, most prominent along the frontal convexities. 5 mm thick acute subdural hematoma along the right convexity and along the left tentorium. Density in the right frontoparietal junction region of the brain that could represent subarachnoid hemorrhage within a deep sulcus, though the possibility of brain contusion or stroke is not excluded at this time. Facial CT: No facial fracture seen fluid in the maxillary sinuses presumed  secondary to sinusitis or nose bleed. Cervical spine CT: No acute or traumatic finding. Chronic degenerative changes. Critical Value/emergent results were called by telephone at the time of interpretation on 08/03/2015 at 5:00 pm to Dr. Toney Rakes , who verbally acknowledged these results. Electronically Signed   By: Paulina Fusi M.D.   On: 08/03/2015 17:11   Portable Chest Xray  08/08/2015  CLINICAL DATA:  Acute respiratory failure with hypoxemia. EXAM: PORTABLE CHEST 1 VIEW COMPARISON:  Chest x-ray dated 02/21/2010 FINDINGS: Endotracheal tube is at the origin of the right mainstem bronchus and needs to be retracted 3-4 cm. Feeding tube is below the diaphragm. The patient has developed small bilateral pleural effusions. Heart size and pulmonary  vascularity are normal. IMPRESSION: 1. The ET tube in the proximal right mainstem bronchus. 2. Small bilateral pleural effusions. 3. Critical Value/emergent results were called by telephone at the time of interpretation on 08/08/2015 at 10:11 am to Clydie BraunKaren, [place]RN, who verbally acknowledged these results. Electronically Signed   By: Francene BoyersJames  Maxwell M.D.   On: 08/08/2015 10:12   Dg Abd Portable 1v  08/07/2015  CLINICAL DATA:  Feeding tube placement EXAM: PORTABLE ABDOMEN - 1 VIEW COMPARISON:  None. FINDINGS: Tip of the feeding tube is at the ligament of Treitz. No disproportionate dilatation of bowel. IMPRESSION: Feeding tube tip is at the ligament of Treitz. Electronically Signed   By: Jolaine ClickArthur  Hoss M.D.   On: 08/07/2015 14:02   Ct Maxillofacial Wo Cm  08/03/2015  CLINICAL DATA:  Larey SeatFell today with trauma to that head, face and neck. Possible syncopal event falling to the concrete landing on face. Alert but confused. EXAM: CT HEAD WITHOUT CONTRAST CT MAXILLOFACIAL WITHOUT CONTRAST CT CERVICAL SPINE WITHOUT CONTRAST TECHNIQUE: Multidetector CT imaging of the head, cervical spine, and maxillofacial structures were performed using the standard protocol without intravenous  contrast. Multiplanar CT image reconstructions of the cervical spine and maxillofacial structures were also generated. COMPARISON:  None. FINDINGS: CT HEAD FINDINGS There is diffuse subarachnoid hemorrhage particularly in the sulci overlying the frontal regions. There is acute subdural hematoma along the right convexity measuring 5 mm in diameter. There is acute subdural hematoma along the left tentorium approximately that same thickness. Area of density on the right on image 14 in the frontoparietal junction region could represent hemorrhage within a deep sulcus, with the possibility of early brain contusion or stroke is not excluded at this time. No skull fracture. No evidence of old infarction. No accelerated atrophy. No hydrocephalus. No shift. No sign of tumor. CT MAXILLOFACIAL FINDINGS There is fluid within both maxillary sinuses. There are a few opacified ethmoid air cells. Frontal and sphenoid sinuses are clear. No fluid in the middle ears or mastoids. No facial fracture. There is soft tissue swelling in the orbital regions but no evidence of globe injury or postseptal orbital injury. CT CERVICAL SPINE FINDINGS No cervical spine fracture. There is facet arthropathy on the right at C2-3, C3-4, C4-5 and C5-6 and on the left to a lesser degree at those levels. There is degenerative anterolisthesis of a few mm at C2-3, C3-4, C4-5, C5-6 and C7-T1. Extensive pleural and parenchymal density is noted at both lung apices, not primarily evaluated and presumably chronic. IMPRESSION: Head CT: Acute traumatic subarachnoid hemorrhage, most prominent along the frontal convexities. 5 mm thick acute subdural hematoma along the right convexity and along the left tentorium. Density in the right frontoparietal junction region of the brain that could represent subarachnoid hemorrhage within a deep sulcus, though the possibility of brain contusion or stroke is not excluded at this time. Facial CT: No facial fracture seen fluid in  the maxillary sinuses presumed secondary to sinusitis or nose bleed. Cervical spine CT: No acute or traumatic finding. Chronic degenerative changes. Critical Value/emergent results were called by telephone at the time of interpretation on 08/03/2015 at 5:00 pm to Dr. Toney RakesERYKA GAYLE , who verbally acknowledged these results. Electronically Signed   By: Paulina FusiMark  Shogry M.D.   On: 08/03/2015 17:11    ECG  Incomplete left bundle branch block with downsloping ST segment and T-wave inversion in the lateral leads.  ASSESSMENT AND PLAN  1. Respiratory vs cardiac arrest  - per report, large mucoid plug suctioned  out after intubation. Telemetry showed onset intermittent NSVT followed by sustained VT vs SVT with aberrancy around 8:41:35AM, followed by period of irregular arrhythmia (afib?) follow by termination of arrhythmia around 9:01:00AM. She was shocked twice and given  bolus amio  - she is still having episodic wide complex tachycardia, will start amiodarone drip without bolus (as she already got bolus this morning)  - will give 2.5mg  q6hr lopressor with holding parameters for suppression of ventricular ectopy  - she is not a good candidate for any invasive workup at this point.   - will obtain echocardiogram. Will defer to neurosurgery whether to recheck CT of head to make sure there was no progression of her intracranial hemorrhage  2. Subarachnoid and subdural hematoma  3. HTN: home atenolol has been on hold due to NPO status  Signed, Azalee Course, PA-C 08/08/2015, 3:16 PM   I have personally seen and examined this patient with Azalee Course, PA-C I agree with the assessment and plan as outlined above. She is admitted following fall with intracranial hemorrhage. She is not responsive 5 days later on the vent. Wide complex tachycardia today. VT vs SVG with aberrancy but clearly other periods of irregularity. Will start IV amiodarone drip and use IV Lopressor. Will check echo to assess LV function. Family  updated at bedside. She is not a candidate for invasive cardiac workup. We will follow with you.   Aamya Orellana 08/08/2015 3:31 PM

## 2015-08-08 NOTE — Procedures (Signed)
Intubation Procedure Note Lisa SnellenFaye K [name] [id number] 08/26/1927  Procedure: Intubation Indications: Respiratory insufficiency  Procedure Details Consent: Unable to obtain consent because of emergent medical necessity. Time Out: Verified patient identification, verified procedure, site/side was marked, verified correct patient position, special equipment/implants available, medications/allergies/relevent history reviewed, required imaging and test results available.  Performed  Maximum sterile technique was used including gloves, hand hygiene and mask.  MAC and 3  Versed 2 mg  fent 100 mcg Etomidate 20  Large piece of congealed mucus debris pulled out of upper airway during intubation, likely obstructing airway  Evaluation Hemodynamic Status: BP stable throughout; O2 sats: stable throughout Patient's Current Condition: stable Complications: No apparent complications Patient did tolerate procedure well. Chest X-ray ordered to verify placement.  CXR: pending.   Lisa [name],[name]. MD 08/08/2015

## 2015-08-08 NOTE — Evaluation (Addendum)
Clinical/Bedside Swallow Evaluation Patient Details  Name: Lisa [name] MRN: [id number] Date of Birth: 02/22/1927  Today's Date: 08/08/2015 Time: SLP Start Time (ACUTE ONLY): 0816 SLP Stop Time (ACUTE ONLY): 0825 SLP Time Calculation (min) (ACUTE ONLY): 9 min  Past Medical History:  Past Medical History  Diagnosis Date  . Allergy   . Pelvis fracture (HCC)   . Wrist fracture, right   . Depression   . Spinal stenosis   . Osteoarthritis   . Hypertension   . Foot fracture     bilateral   Past Surgical History:  Past Surgical History  Procedure Laterality Date  . Back surgery N/A   . Breast lumpectomy N/A    HPI:  81 year old female presents after a fall in a parking lot sustaining Bil Frontal Cerebral Contusions. pt with hx of HTN, Bil "Foot" fxs, Pelvic fxs, R Wrist fx, Back surgeries, and Depression.    Assessment / Plan / Recommendation Clinical Impression  Swallow evaluation complete. Evaluation limited by lethargy/AMS. Patient with eyes closed, wet upper airway noise on both inhalation and exhalation. Oral care complete with reflexive bite on end of toothette requiring max tactile cues for removal. No evidence of oral awareness of bolus noted with ice chip trials with tight labial seal and no effort to open oral cavity. SLP will continue to f/u for diagnostic po trials. Prognosis for abiilty to resume pos good with improved alertness and mentation.    Also recommend cognitive-linguistic evaluation given cognitive deficits in the setting of TBI.     Aspiration Risk  Severe aspiration risk    Diet Recommendation   NPO  Medication Administration: Via alternative means    Other  Recommendations Oral Care Recommendations: Oral care QID   Follow up Recommendations   (TBD)    Frequency and Duration min 2x/week  2 weeks       Swallow Study   General HPI: 81 year old female presents after a fall in a parking lot sustaining Bil Frontal Cerebral Contusions. pt with hx of  HTN, Bil "Foot" fxs, Pelvic fxs, R Wrist fx, Back surgeries, and Depression.  Type of Study: Bedside Swallow Evaluation Previous Swallow Assessment: none noted in chart Diet Prior to this Study: NPO Temperature Spikes Noted: No Respiratory Status: Nasal cannula History of Recent Intubation: No Behavior/Cognition: Lethargic/Drowsy Oral Cavity Assessment: Dry;Dried secretions Oral Care Completed by SLP: Yes Oral Cavity - Dentition: Adequate natural dentition Vision:  (eyes closed) Self-Feeding Abilities: Total assist Patient Positioning: Upright in bed Baseline Vocal Quality: Wet (wet upper airway noise ) Volitional Cough: Cognitively unable to elicit Volitional Swallow: Unable to elicit    Oral/Motor/Sensory Function Overall Oral Motor/Sensory Function: Within functional limits (however unable to formally assess)   Ice Chips Ice chips: Impaired Oral Phase Impairments: Poor awareness of bolus   Thin Liquid Thin Liquid: Not tested    Nectar Thick Nectar Thick Liquid: Not tested   Honey Thick Honey Thick Liquid: Not tested   Puree Puree: Not tested   Solid Solid: Not tested      Ferdinand LangoLeah Elizabet Schweppe MA, CCC-SLP [phone]  [name] [name] 08/08/2015,8:34 AM

## 2015-08-08 NOTE — Progress Notes (Signed)
ANTIBIOTIC CONSULT NOTE - INITIAL  Pharmacy Consult for unasyn Indication: aspiration PNA  Allergies  Allergen Reactions  . Ambien [Zolpidem Tartrate] Other (See Comments)    Reaction:  Hallucinations   . Oxycodone Nausea And Vomiting  . Shellfish Allergy Swelling and Other (See Comments)    Pts mouth and face swells.   . Ace Inhibitors Cough  . Daypro [Oxaprozin] Other (See Comments)    GI upset  . Norvasc [Amlodipine Besylate] Palpitations    Patient Measurements: Weight: 152 lb 5.4 oz (69.1 kg)   Vital Signs: Temp: 97 F (36.1 C) (11/16 0800) Temp Source: Oral (11/16 0800) BP: 93/50 mmHg (11/16 0904) Pulse Rate: 102 (11/16 0904) Intake/Output from previous day: 11/15 0701 - 11/16 0700 In: 2444.7 [I.V.:1843; NG/GT:601.7] Out: 770 [Urine:770] Intake/Output from this shift: Total I/O In: 3 [I.V.:3] Out: -   Labs: No results for input(s): WBC, HGB, PLT, LABCREA, CREATININE in the last 72 hours. Estimated Creatinine Clearance: 20 mL/min (by C-G formula based on Cr of 1.88). No results for input(s): VANCOTROUGH, VANCOPEAK, VANCORANDOM, GENTTROUGH, GENTPEAK, GENTRANDOM, TOBRATROUGH, TOBRAPEAK, TOBRARND, AMIKACINPEAK, AMIKACINTROU, AMIKACIN in the last 72 hours.   Microbiology: Recent Results (from the past 720 hour(s))  MRSA PCR Screening     Status: None   Collection Time: 08/03/15  7:20 PM  Result Value Ref Range Status   MRSA by PCR NEGATIVE NEGATIVE Final    Comment:        The GeneXpert MRSA Assay (FDA approved for NASAL specimens only), is one component of a comprehensive MRSA colonization surveillance program. It is not intended to diagnose MRSA infection nor to guide or monitor treatment for MRSA infections.     Medical History: Past Medical History  Diagnosis Date  . Allergy   . Pelvis fracture (HCC)   . Wrist fracture, right   . Depression   . Spinal stenosis   . Osteoarthritis   . Hypertension   . Foot fracture     bilateral     Assessment: 88 yof s/p fall and subdural hematoma/SAH. Transfer[facility]. Pharmacy consulted to dose Unasyn for aspiration PNA, patient intubated 11/16. Afeb, wbc 15.7 on 11/1. No cultures. CrCl~20 (SCr 1.88 from 11/11)  11/16 Unasyn>>  MRSA PCR neg  Goal of Therapy:  Eradication of infection  Plan:  Unasyn 1.5g IV q12h Monitor clinical progress, c/s, renal function, abx plan/LOT Monitor lytes F/u new labs in AM - adjust dose as appropriate  Babs BertinHaley Taetum Flewellen, PharmD Clinical Pharmacist Pager [phone] 08/08/2015 9:33 AM

## 2015-08-08 NOTE — Progress Notes (Signed)
Pt was witnessed to be in initial rhythm at rate in 150-160s. Then proceeded to go into a wide complex tachycardia in 180-190s. Patient was shocked at 120 J at 0844, the rate slowed to 116s  and then immediately returned to 190s wide complex tachycardia, patient was then shocked a second time with 150 J at 0845. Dr Vassie LollAlva at beside, orders given to inject amio and lopressor. Pt then intubated, son was contacted via telephone and informed. After intubation pt returned to NSR. Will continue to monitor.

## 2015-08-08 NOTE — Progress Notes (Signed)
Amiodarone Drug - Drug Interaction Consult Note  Recommendations: No drug interactions noted.  Amiodarone is metabolized by the cytochrome P450 system and therefore has the potential to cause many drug interactions. Amiodarone has an average plasma half-life of 50 days (range 20 to 100 days).   There is potential for drug interactions to occur several weeks or months after stopping treatment and the onset of drug interactions may be slow after initiating amiodarone.   []  Statins: Increased risk of myopathy. Simvastatin- restrict dose to 20mg  daily. Other statins: counsel patients to report any muscle pain or weakness immediately.  []  Anticoagulants: Amiodarone can increase anticoagulant effect. Consider warfarin dose reduction. Patients should be monitored closely and the dose of anticoagulant altered accordingly, remembering that amiodarone levels take several weeks to stabilize.  []  Antiepileptics: Amiodarone can increase plasma concentration of phenytoin, the dose should be reduced. Note that small changes in phenytoin dose can result in large changes in levels. Monitor patient and counsel on signs of toxicity.  []  Beta blockers: increased risk of bradycardia, AV block and myocardial depression. Sotalol - avoid concomitant use.  []   Calcium channel blockers (diltiazem and verapamil): increased risk of bradycardia, AV block and myocardial depression.  []   Cyclosporine: Amiodarone increases levels of cyclosporine. Reduced dose of cyclosporine is recommended.  []  Digoxin dose should be halved when amiodarone is started.  []  Diuretics: increased risk of cardiotoxicity if hypokalemia occurs.  []  Oral hypoglycemic agents (glyburide, glipizide, glimepiride): increased risk of hypoglycemia. Patient's glucose levels should be monitored closely when initiating amiodarone therapy.   []  Drugs that prolong the QT interval:  Torsades de pointes risk may be increased with concurrent use - avoid if  possible.  Monitor QTc, also keep magnesium/potassium WNL if concurrent therapy can't be avoided. [name]. Antibiotics: e.g. fluoroquinolones, erythromycin. . Antiarrhythmics: e.g. quinidine, procainamide, disopyramide, sotalol. . Antipsychotics: e.g. phenothiazines, haloperidol.  . Lithium, tricyclic antidepressants, and methadone.  Thank You,  Loura BackJennifer Edgerton, 17[address] Rainbow BoulevardPharm.D., BCPS Clinical Pharmacist Pager: [phone] 08/08/2015 3:11 PM

## 2015-08-08 NOTE — Procedures (Signed)
Intubation Procedure Note Marita SnellenFaye K Ines [id number] 05/01/1927  Procedure: Intubation Indications: Respiratory insufficiency  Procedure Details Consent: Unable to obtain consent because of emergent medical necessity. Time Out: Verified patient identification, verified procedure, site/side was marked, verified correct patient position, special equipment/implants available, medications/allergies/relevent history reviewed, required imaging and test results available.  Performed  MAC and 3 Medications:  Fentanyl 100 mcg Etomidate 20 mg Versed 4 mg NMB none   Evaluation Hemodynamic Status: BP stable throughout; O2 sats: stable throughout Patient's Current Condition: stable Complications: No apparent complications Patient did tolerate procedure well. Chest X-ray ordered to verify placement.  CXR: pending. Intubated post wide complex tachycardia.  Brett CanalesSteve Nakyiah Kuck ACNP [facility] Pager [phone] till 3 pm If no answer page [phone] 08/08/2015, 9:22 AM

## 2015-08-08 NOTE — Consult Note (Signed)
PULMONARY / CRITICAL CARE MEDICINE   Name: Lisa [name] MRN: [id number] DOB: 11-15-1926    ADMISSION DATE:  08/03/2015 CONSULTATION DATE:  11/16   REFERRING MD :  NS  CHIEF COMPLAINT:  AMS  INITIAL PRESENTATION: AMS post fall with sdh/sah  STUDIES:    SIGNIFICANT EVENTS: 11/11 fall with sah/sdh 11/16 Wide complex tachycardia, cardioversion x 2 11/16 intubated   HISTORY OF PRESENT ILLNESS:    81 yo WF who fell 08/03/15 and was transferred to [facility] 11/11 with SDH/SAH after fall and was accepted to NS service. No surgical interventions required. On 11/16 she developed wide complex tachycardia that required shock x 2 and amiodarone injection to break to SR. She was in obvious respiratory distress and was intubated by [facility] team and subsequently was consulted for CC management.    PAST MEDICAL HISTORY :   has a past medical history of Allergy; Pelvis fracture (HCC); Wrist fracture, right; Depression; Spinal stenosis; Osteoarthritis; Hypertension; and Foot fracture.  has past surgical history that includes Back surgery (N/A) and Breast lumpectomy (N/A). Prior to Admission medications   Medication Sig Start Date End Date Taking? Authorizing Provider  atenolol (TENORMIN) 50 MG tablet Take 50 mg by mouth daily.    Yes Historical Provider, MD  citalopram (CELEXA) 40 MG tablet Take 40 mg by mouth daily.   Yes Historical Provider, MD  HYDROcodone-acetaminophen (NORCO/VICODIN) 5-325 MG tablet Take 1 tablet by mouth 4 (four) times daily as needed for moderate pain.   Yes Historical Provider, MD  irbesartan (AVAPRO) 300 MG tablet Take 150 mg by mouth daily.    Yes Historical Provider, MD  aspirin EC 81 MG tablet Take 81 mg by mouth daily.    Historical Provider, MD  calcium-vitamin D (OSCAL WITH D) 500-200 MG-UNIT tablet Take 1 tablet by mouth 3 (three) times daily.    Historical Provider, MD  cholecalciferol (VITAMIN D) 1000 UNITS tablet Take 1,000 Units by mouth daily.    Historical  Provider, MD  Multiple Vitamins-Minerals (ICAPS AREDS 2) CAPS Take 1 capsule by mouth 2 (two) times daily.     Historical Provider, MD   Allergies  Allergen Reactions  . Ambien [Zolpidem Tartrate] Other (See Comments)    Reaction:  Hallucinations   . Oxycodone Nausea And Vomiting  . Shellfish Allergy Swelling and Other (See Comments)    Pts mouth and face swells.   . Ace Inhibitors Cough  . Daypro [Oxaprozin] Other (See Comments)    GI upset  . Norvasc [Amlodipine Besylate] Palpitations    FAMILY HISTORY:  indicated that her mother is deceased. She indicated that her father is deceased.  SOCIAL HISTORY:  reports that she has never smoked. She has never used smokeless tobacco. She reports that she does not drink alcohol or use illicit drugs.  REVIEW OF SYSTEMS: na  SUBJECTIVE:   VITAL SIGNS: Temp:  [97 F (36.1 C)-98.6 F (37 C)] 97 F (36.1 C) (11/16 0800) Pulse Rate:  [76-113] 102 (11/16 0904) Resp:  [22-35] 28 (11/16 0904) BP: (93-187)/(50-110) 93/50 mmHg (11/16 0904) SpO2:  [94 %-100 %] 100 % (11/16 0904) FiO2 (%):  [100 %] 100 % (11/16 0904) Weight:  [152 lb 5.4 oz (69.1 kg)-153 lb 3.5 oz (69.5 kg)] 152 lb 5.4 oz (69.1 kg) (11/16 0500) HEMODYNAMICS:   VENTILATOR SETTINGS: Vent Mode:  [-]  FiO2 (%):  [100 %] 100 % INTAKE / OUTPUT:  Intake/Output Summary (Last 24 hours) at 08/08/15 0932 Last data filed at 08/08/15 [id number]  Gross per 24 hour  Intake 2287.67 ml  Output    675 ml  Net 1612.67 ml    PHYSICAL EXAMINATION: General: Frail elderly female, post cardioversion, poorly responsive Neuro: No follows commands, MAE x 4,  HEENT:  Raccoon eyes, perl 3 mm, dry oral mucosa Cardiovascular:  HSD Lungs:  Decreased bs bases Abdomen: soft + bs Musculoskeletal: no overt deformities  Skin:  Warm and dry  LABS:  CBC  Recent Labs Lab 08/03/15 1628 08/03/15 2022  WBC 13.0* 15.7*  HGB 9.7* 9.7*  HCT 30.0* 30.3*  PLT 222 225   Coag's  Recent Labs Lab  08/03/15 1628 08/03/15 2022  APTT 39* 42*  INR 1.28 1.20   BMET  Recent Labs Lab 08/03/15 1628 08/03/15 2022 08/05/15 0225 08/07/15 0343  NA 136 136 145 151*  K 4.1 3.9  --  5.1  CL 103 105  --   --   CO2 25 20*  --   --   BUN 25* 22*  --   --   CREATININE 1.86* 1.88*  --   --   GLUCOSE 119* 157*  --   --    Electrolytes  Recent Labs Lab 08/03/15 1628 08/03/15 2022  CALCIUM 8.5* 8.5*   Sepsis Markers No results for input(s): LATICACIDVEN, PROCALCITON, O2SATVEN in the last 168 hours. ABG No results for input(s): PHART, PCO2ART, PO2ART in the last 168 hours. Liver Enzymes  Recent Labs Lab 08/03/15 1628 08/03/15 2022  AST 30 32  ALT 12* 14  ALKPHOS 69 75  BILITOT 0.3 0.5  ALBUMIN 3.4* 3.3*   Cardiac Enzymes  Recent Labs Lab 08/03/15 1628  TROPONINI <0.03   Glucose  Recent Labs Lab 08/07/15 2028 08/08/15 0032 08/08/15 0738  GLUCAP 168* 170* 170*    Imaging Dg Abd Portable 1v  08/07/2015  CLINICAL DATA:  Feeding tube placement EXAM: PORTABLE ABDOMEN - 1 VIEW COMPARISON:  None. FINDINGS: Tip of the feeding tube is at the ligament of Treitz. No disproportionate dilatation of bowel. IMPRESSION: Feeding tube tip is at the ligament of Treitz. Electronically Signed   By: Jolaine ClickArthur  Hoss M.D.   On: 08/07/2015 14:02     ASSESSMENT / PLAN:  PULMONARY OETT 11/16>> A: Aspiration pna Resp insuff with blockage of airway P:   Vent bundle Abx for aspiration Wean whn more awake  CARDIOVASCULAR CVL A:  Wide complex tachycardia(suspect airway obstruction culprit) P:  Amio push during cardioversion BB as needed  RENAL A:  Renal insuff Hyperkalemia P:   Avoid nephrotoxins Monitor K+  GASTROINTESTINAL A:   Aspiration Tube feeds P:   PPI Tube feeds  HEMATOLOGIC A:   Monitor for anemia P:  CBC  INFECTIOUS A:   Suspected aspiration P:   BCx2 11/16>> UC 11/16>> Sputum/16>> Abx:  11/16 unasyn>>  ENDOCRINE A: Hyperglycemia  P:    SSI  NEUROLOGIC A:   AMS, no follows commands post fall with SDH/SAH Intubated 11/16 for resp insuff Sedate for tube tolerance P:   RASS goal: -1 Intermittent sedation Avoid benzos if possible   FAMILY  - Updates: SOn notified by phone 11/16 of need to intubate.  - Inter-disciplinary family meet or Palliative Care meeting due by:  day 7    TODAY'S SUMMARY:   81 yo WF who fell 08/03/15 and [facility] with SDH/SAH after fall and was accepted to NS service. No surgical interventions required. On 11/16 she developed wide complex tachycardia that required shock x 2  and amiodarone injection to break to SR. She was in obvious respiratory distress and was intubated by [facility] team and subsequently was consulted for CC management.    Brett Canales Narmeen Kerper ACNP [facility] PCCM Pager [phone] till 3 pm If no answer page [phone] 08/08/2015, 9:37 AM

## 2015-08-08 NOTE — Progress Notes (Signed)
  Echocardiogram 2D Echocardiogram has been performed.  Lisa [name], Lisa [name] 08/08/2015, 4:09 PM

## 2015-08-08 NOTE — Progress Notes (Signed)
  Echocardiogram 2D Echocardiogram has been performed.  Arvil ChacoFoster, Kaisa Wofford 08/08/2015, 4:08 PM

## 2015-08-08 NOTE — Progress Notes (Signed)
Patient ID: Lisa SnellenFaye K Kleinman, female   DOB: 07/17/1927, 81 y.o.   MRN: [id number] BP 133/61 mmHg  Pulse 102  Temp(Src) 100.1 F (37.8 C) (Axillary)  Resp 26  Ht 5' (1.524 m)  Wt 69.1 kg (152 lb 5.4 oz)  BMI 29.75 kg/m2  SpO2 100% Events of the day noted. She had a myocardial infarction.  No reason at this time to repeat head ct as she had been improving neurologically Appreciate both ccm, and cardiology assistance.  Will continue with full code at this time.

## 2015-08-08 NOTE — Progress Notes (Signed)
RT pulled ETT back to 22 from 25 per Dr. Vassie LollAlva verbal orders. RT will continue to monitor.

## 2015-08-09 ENCOUNTER — Inpatient Hospital Stay (HOSPITAL_COMMUNITY): Payer: Medicare Other

## 2015-08-09 DIAGNOSIS — N179 Acute kidney failure, unspecified: Secondary | ICD-10-CM

## 2015-08-09 DIAGNOSIS — J9621 Acute and chronic respiratory failure with hypoxia: Secondary | ICD-10-CM

## 2015-08-09 DIAGNOSIS — I471 Supraventricular tachycardia: Secondary | ICD-10-CM

## 2015-08-09 DIAGNOSIS — I420 Dilated cardiomyopathy: Secondary | ICD-10-CM

## 2015-08-09 DIAGNOSIS — I6200 Nontraumatic subdural hemorrhage, unspecified: Secondary | ICD-10-CM

## 2015-08-09 LAB — GLUCOSE, CAPILLARY
GLUCOSE-CAPILLARY: 150 mg/dL — AB (ref 65–99)
Glucose-Capillary: 117 mg/dL — ABNORMAL HIGH (ref 65–99)
Glucose-Capillary: 150 mg/dL — ABNORMAL HIGH (ref 65–99)
Glucose-Capillary: 153 mg/dL — ABNORMAL HIGH (ref 65–99)
Glucose-Capillary: 159 mg/dL — ABNORMAL HIGH (ref 65–99)
Glucose-Capillary: 168 mg/dL — ABNORMAL HIGH (ref 65–99)

## 2015-08-09 LAB — BASIC METABOLIC PANEL
ANION GAP: 9 (ref 5–15)
BUN: 59 mg/dL — AB (ref 6–20)
BUN: 58 mg/dL — ABNORMAL HIGH (ref 6–20)
CALCIUM: 8 mg/dL — AB (ref 8.9–10.3)
CO2: 15 mmol/L — ABNORMAL LOW (ref 22–32)
CO2: 17 mmol/L — ABNORMAL LOW (ref 22–32)
Calcium: 8.2 mg/dL — ABNORMAL LOW (ref 8.9–10.3)
Chloride: >130 mmol/L (ref 101–111)
Chloride: 127 mmol/L — ABNORMAL HIGH (ref 101–111)
Creatinine, Ser: 1.77 mg/dL — ABNORMAL HIGH (ref 0.44–1.00)
Creatinine, Ser: 1.76 mg/dL — ABNORMAL HIGH (ref 0.44–1.00)
GFR calc Af Amer: 29 mL/min — ABNORMAL LOW (ref >60)
GFR, EST AFRICAN AMERICAN: 29 mL/min — AB (ref >60)
GFR, EST NON AFRICAN AMERICAN: 25 mL/min — AB (ref >60)
GFR, EST NON AFRICAN AMERICAN: 25 mL/min — AB (ref >60)
GLUCOSE: 166 mg/dL — AB (ref 65–99)
Glucose, Bld: 182 mg/dL — ABNORMAL HIGH (ref 65–99)
POTASSIUM: 4.9 mmol/L (ref 3.5–5.1)
Potassium: 4 mmol/L (ref 3.5–5.1)
SODIUM: 153 mmol/L — AB (ref 135–145)
Sodium: 155 mmol/L — ABNORMAL HIGH (ref 135–145)

## 2015-08-09 LAB — CBC
HCT: 27.9 % — ABNORMAL LOW (ref 36.0–46.0)
Hemoglobin: 8.8 g/dL — ABNORMAL LOW (ref 12.0–15.0)
MCH: 30.3 pg (ref 26.0–34.0)
MCHC: 31.5 g/dL (ref 30.0–36.0)
MCV: 96.2 fL (ref 78.0–100.0)
PLATELETS: 166 10*3/uL (ref 150–400)
RBC: 2.9 MIL/uL — AB (ref 3.87–5.11)
RDW: 17.2 % — AB (ref 11.5–15.5)
WBC: 12 10*3/uL — ABNORMAL HIGH (ref 4.0–10.5)

## 2015-08-09 LAB — URINALYSIS, ROUTINE W REFLEX MICROSCOPIC
Bilirubin Urine: NEGATIVE
Glucose, UA: NEGATIVE mg/dL
Ketones, ur: NEGATIVE mg/dL
Nitrite: NEGATIVE
PROTEIN: 30 mg/dL — AB
Specific Gravity, Urine: 1.02 (ref 1.005–1.030)
pH: 5 (ref 5.0–8.0)

## 2015-08-09 LAB — URINE MICROSCOPIC-ADD ON

## 2015-08-09 LAB — MAGNESIUM: Magnesium: 1.6 mg/dL — ABNORMAL LOW (ref 1.7–2.4)

## 2015-08-09 LAB — PHOSPHORUS: Phosphorus: 1.5 mg/dL — ABNORMAL LOW (ref 2.5–4.6)

## 2015-08-09 MED ORDER — K PHOS MONO-SOD PHOS DI & MONO 155-852-130 MG PO TABS
250.0000 mg | ORAL_TABLET | Freq: Three times a day (TID) | ORAL | Status: AC
  Administered 2015-08-09 (×3): 250 mg via ORAL
  Filled 2015-08-09 (×3): qty 1

## 2015-08-09 MED ORDER — VITAL AF 1.2 CAL PO LIQD
1000.0000 mL | ORAL | Status: DC
  Administered 2015-08-09 – 2015-08-15 (×6): 1000 mL
  Filled 2015-08-09 (×12): qty 1000

## 2015-08-09 MED ORDER — SODIUM CHLORIDE 0.45 % IV SOLN
INTRAVENOUS | Status: DC
Start: 2015-08-09 — End: 2015-08-09
  Administered 2015-08-09: 05:00:00 via INTRAVENOUS

## 2015-08-09 MED ORDER — DEXTROSE 5 % IV SOLN
INTRAVENOUS | Status: DC
  Administered 2015-08-09 – 2015-08-10 (×2): via INTRAVENOUS

## 2015-08-09 MED ORDER — FREE WATER
200.0000 mL | Status: DC
Start: 2015-08-09 — End: 2015-08-16
  Administered 2015-08-09 – 2015-08-15 (×36): 200 mL

## 2015-08-09 NOTE — Progress Notes (Signed)
eLink Physician-Brief Progress Note Patient Name: Lisa SnellenFaye K Dejarnette DOB: 02/14/1927 MRN: [id number]   Date of Service  08/09/2015  HPI/Events of Note  Persistent hypernatremia and hypercholermia  eICU Interventions  Change IVFs to 1/2NS Recheck labs      Intervention Category Major Interventions: Electrolyte abnormality - evaluation and management  Nash Bolls 08/09/2015, 4:49 AM

## 2015-08-09 NOTE — Progress Notes (Signed)
PT Cancellation and Discharge Note  Patient Details Name: Lisa [name] MRN: [id number] DOB: 02/14/1927   Cancelled Treatment:    Reason Eval/Treat Not Completed: Medical issues which prohibited therapy.  Spoke with RN about pt's medical decline since previous PT session.  Will sign off of PT at this time and need new orders once appropriate for PT and mobility.     Omarr Hann, Alison MurrayMegan F 08/09/2015, 9:07 AM

## 2015-08-09 NOTE — Progress Notes (Signed)
Nutrition Follow-up  INTERVENTION:   D/C Jevity 1.2 and Prostat  Vital AF 1.2 @ 60 ml/hr Provides: 1728 kcal (103% of needs), 108 grams protein, and 1167 ml H2O Total free water: 2367 ml   NUTRITION DIAGNOSIS:   Inadequate oral intake related to inability to eat as evidenced by NPO status. Ongoing.   GOAL:   Patient will meet greater than or equal to 90% of their needs Not met.   MONITOR:   I & O's, Vent status, Labs, Weight trends, TF tolerance  REASON FOR ASSESSMENT:   Ventilator (Intubated 11/16) Enteral/tube feeding initiation and management  ASSESSMENT:   Pt admitted on 08/03/15 s/p fall with subarachnoid hemorrhage.  Pt with cardiac/respiratory arrest and intubated 11/16.  Patient is currently intubated on ventilator support MV: 16.1 L/min Temp (24hrs), Avg:99.4 F (37.4 C), Min:97.6 F (36.4 C), Max:101.1 F (38.4 C)  Medications reviewed and include: MVI, K Phos, Calcium with D 200 ml H2O every 4 hours Labs reviewed: sodium elevated (155), Phosphorus 1.5 and Magnesium 1.6 CBG's: 150-168 Per MD poor prognosis.  Diet Order:  Diet NPO time specified  Skin:  Reviewed, no issues  Last BM:  11/16  Height:   Ht Readings from Last 1 Encounters:  08/08/15 5' (1.524 m)   Weight:   Wt Readings from Last 1 Encounters:  08/09/15 153 lb 14.1 oz (69.8 kg)   Ideal Body Weight:  52.2 kg  BMI:  Body mass index is 30.05 kg/(m^2).  Estimated Nutritional Needs:   Kcal:  1683  Protein:  80-95 grams  Fluid:  > 1.6 L/day  EDUCATION NEEDS:   No education needs identified at this time  [place], [place], [place] Pager [phone] After Hours Pager

## 2015-08-09 NOTE — Progress Notes (Signed)
CRITICAL VALUE ALERT  Critical value received:  Chloride 130  Date of notification:  08/09/15  Time of notification:  0436  Critical value read back:Yes.    Nurse who received alert:  Herma ArdEllie Messer RN BSN  MD notified (1st page): Erin FullingV. Mungal MD  Time of first page:  [phone]  MD notified (2nd page):  Time of second page:  Responding MD: Erin FullingV. Mungal MD  Time MD responded:  [phone]  New orders received and carried out. Will continue to monitor. Herma ArdMesser, Farzana Koci RN BSN

## 2015-08-09 NOTE — Progress Notes (Signed)
Pt with cardiac arrest yesterday after exhibiting wide complex tachycardia on telemetry; she required shock x 2.  She also was emergently intubated yesterday 11/16 due to hypoxic respiratory failure, likely due to aspiration PNA.   Her prognosis from a neuro standpoint is poor, per MD notes; minimally responsive.  Son made partial code today.    Will continue to follow progress.    Quintella BatonJulie W. Merissa Renwick, RN, BSN  Trauma/Neuro ICU Case Manager [phone]

## 2015-08-09 NOTE — Progress Notes (Signed)
SUBJECTIVE: pt intubated, not responding  Tele: sinus, several short runs of SVT  BP 159/73 mmHg  Pulse 99  Temp(Src) 97.6 F (36.4 C) (Axillary)  Resp 36  Ht 5' (1.524 m)  Wt 153 lb 14.1 oz (69.8 kg)  BMI 30.05 kg/m2  SpO2 100%  Intake/Output Summary (Last 24 hours) at 08/09/15 0908 Last data filed at 08/09/15 0800  Gross per 24 hour  Intake 10983.18 ml  Output   1520 ml  Net 9463.18 ml    PHYSICAL EXAM General: intubated, not responsive Neck: No JVD. No masses noted.  Lungs: Clear bilaterally with no wheezes or rhonci noted.  Heart: RRR with no murmurs noted. Abdomen: Bowel sounds are present. Soft, non-tender.  Extremities: No lower extremity edema.   LABS: Basic Metabolic Panel:  Recent Labs  56/21/30 1125 08/09/15 0337  NA 156* 155*  K 5.5* 4.9  CL >130* >130*  CO2 16* 15*  GLUCOSE 146* 166*  BUN 62* 59*  CREATININE 1.82* 1.77*  CALCIUM 8.5* 8.2*  MG  --  1.6*  PHOS  --  1.5*   CBC:  Recent Labs  08/08/15 1125 08/09/15 0337  WBC 14.0* 12.0*  HGB 8.5* 8.8*  HCT 27.1* 27.9*  MCV 96.4 96.2  PLT 216 166   Cardiac Enzymes:  Recent Labs  08/08/15 1125  TROPONINI 1.10*   Fasting Lipid Panel: No results for input(s): CHOL, HDL, LDLCALC, TRIG, CHOLHDL, LDLDIRECT in the last 72 hours.  Current Meds: . ampicillin-sulbactam (UNASYN) IV  1.5 g Intravenous Q12H  . antiseptic oral rinse  7 mL Mouth Rinse q12n4p  . antiseptic oral rinse  7 mL Mouth Rinse 10 times per day  . calcium-vitamin D  1 tablet Oral TID  . chlorhexidine gluconate  15 mL Mouth Rinse BID  . feeding supplement (PRO-STAT SUGAR FREE 64)  30 mL Per Tube Daily  . free water  200 mL Per Tube Q4H  . insulin aspart  0-9 Units Subcutaneous 6 times per day  . metoprolol  2.5 mg Intravenous 4 times per day  . multivitamin-lutein  1 capsule Oral BID  . pantoprazole sodium  40 mg Per Tube Q1200  . phosphorus  250 mg Oral TID    Echo 08/08/15: Left ventricle: The cavity size  was normal. Wall thickness was normal. The estimated ejection fraction was in the range of 15% to 20%. Akinesis of the entireanteroseptal myocardium. - Aortic valve: There was trivial regurgitation. - Left atrium: The atrium was mildly dilated. - Right atrium: The atrium was mildly dilated. - Pulmonary arteries: Systolic pressure was moderately increased. PA peak pressure: 65 mm Hg (S).  ASSESSMENT AND PLAN: 81 yo female admitted following fall with head trauma sustaining intracranial hemorrhage, intubated and not responsive. Cardiology consulted 08/08/15 after cardiac/resp arrest with wide complex tachycardia. A large mucoid plug was suctioned out at the time of her arrest. She was shocked twice and given a bolus of amiodarone.   1. Respiratory vs cardiac arrest with ventricular tachycardia vs SVT with aberration with underlying LBBB. I think this was most likely SVT. Sinus on amiodarone drip and IV metoprolol this am. Echo 08/08/15 with LVEF=15-20%. She is not a good candidate for an invasive cardiac workup at this point. I think it is reasonable to continue the IV amiodarone and IV metoprolol for now. If no further arrhythmias, can stop IV amiodarone tomorrow. I have discussed her cardiac status with her son. Her overall prognosis from a neuro standpoint is  poor. PCCM is following and having DNR discussion with her son today - 2. Subarachnoid and subdural hematoma: Neurosurgery following.     Yoltzin Barg  11/17/20169:08 AM

## 2015-08-09 NOTE — Progress Notes (Signed)
Pt noted to have increase of episodes of tachycardia(160's-190's). Pt also sustaining episodes of tachycardia longer.PRN medications administered with little relief.  On call cardiology notified, new orders carried out. Will continue to monitor. Herma ArdMesser, Nikkol Pai RN BSN.

## 2015-08-09 NOTE — Progress Notes (Signed)
Pt unable to void within six hours of last episode. Bladder scan showed 520 cc of urine. In and out cath preformed with 950 cc of malodorous clear urine removed. Post bladder scan 0 cc. Will continue to monitor. Herma ArdMesser, Asher Torpey RN BSN.

## 2015-08-09 NOTE — Progress Notes (Signed)
PULMONARY / CRITICAL CARE MEDICINE   Name: Lisa [name] MRN: [id number] DOB: 1926/11/22    ADMISSION DATE:  08/03/2015 CONSULTATION DATE:  11/16   REFERRING MD :  NS  CHIEF COMPLAINT:  AMS  INITIAL PRESENTATION:  81 yo WF who fell 08/03/15 and was transferred to [facility] 11/11 with SDH/SAH after fall and was accepted to NS service. No surgical interventions required. On 11/16 she developed wide complex tachycardia that required shock x 2 and amiodarone injection to break to SR. She was in obvious respiratory distress due to aspiration Emergently intubated and large mucoid debris suctioned out of her airway   STUDIES:  Echo 11/16 - EF 15%, RVSP 65 11/13 head CT >> stable BILATERAL  frontal intracerebral hematomas,  surrounding edema is more prominent   SIGNIFICANT EVENTS: 11/11 fall with sah/sdh 11/16 Wide complex tachycardia, cardioversion x 2 11/16 intubated     SUBJECTIVE: Low gr fever  Remains poorly responsive Good UO Back in nSR  VITAL SIGNS: Temp:  [97.6 F (36.4 C)-101.1 F (38.4 C)] 97.6 F (36.4 C) (11/17 0800) Pulse Rate:  [88-115] 99 (11/17 0717) Resp:  [21-42] 36 (11/17 0717) BP: (93-234)/(50-218) 159/73 mmHg (11/17 0717) SpO2:  [97 %-100 %] 100 % (11/17 0717) FiO2 (%):  [40 %-100 %] 40 % (11/17 0717) Weight:  [153 lb 14.1 oz (69.8 kg)] 153 lb 14.1 oz (69.8 kg) (11/17 0500) HEMODYNAMICS:   VENTILATOR SETTINGS: Vent Mode:  [-] PSV;CPAP FiO2 (%):  [40 %-100 %] 40 % Set Rate:  [14 bmp] 14 bmp Vt Set:  [370 mL] 370 mL PEEP:  [5 cmH20] 5 cmH20 Pressure Support:  [18 cmH20] 18 cmH20 Plateau Pressure:  [17 cmH20-26 cmH20] 17 cmH20 INTAKE / OUTPUT:  Intake/Output Summary (Last 24 hours) at 08/09/15 0846 Last data filed at 08/09/15 0800  Gross per 24 hour  Intake 11033.18 ml  Output   1520 ml  Net 9513.18 ml    PHYSICAL EXAMINATION: General: Frail elderly female, post cardioversion, poorly responsive Neuro: does Not follows commands, MAE x 4,   HEENT:  Raccoon eyes, perl 3 mm, dry oral mucosa Cardiovascular:  HSD Lungs:  Decreased bs bases Abdomen: soft + bs Musculoskeletal: no overt deformities  Skin:  Warm and dry  LABS:  CBC  Recent Labs Lab 08/03/15 2022 08/08/15 1125 08/09/15 0337  WBC 15.7* 14.0* 12.0*  HGB 9.7* 8.5* 8.8*  HCT 30.3* 27.1* 27.9*  PLT 225 216 166   Coag's  Recent Labs Lab 08/03/15 1628 08/03/15 2022  APTT 39* 42*  INR 1.28 1.20   BMET  Recent Labs Lab 08/03/15 2022  08/07/15 0343 08/08/15 1125 08/09/15 0337  NA 136  < > 151* 156* 155*  K 3.9  --  5.1 5.5* 4.9  CL 105  --   --  >130* >130*  CO2 20*  --   --  16* 15*  BUN 22*  --   --  62* 59*  CREATININE 1.88*  --   --  1.82* 1.77*  GLUCOSE 157*  --   --  146* 166*  < > = values in this interval not displayed. Electrolytes  Recent Labs Lab 08/03/15 2022 08/08/15 1125 08/09/15 0337  CALCIUM 8.5* 8.5* 8.2*  MG  --   --  1.6*  PHOS  --   --  1.5*   Sepsis Markers No results for input(s): LATICACIDVEN, PROCALCITON, O2SATVEN in the last 168 hours. ABG  Recent Labs Lab 08/08/15 1040  PHART 7.304*  PCO2ART  26.9*  PO2ART 306*   Liver Enzymes  Recent Labs Lab 08/03/15 1628 08/03/15 2022  AST 30 32  ALT 12* 14  ALKPHOS 69 75  BILITOT 0.3 0.5  ALBUMIN 3.4* 3.3*   Cardiac Enzymes  Recent Labs Lab 08/03/15 1628 08/08/15 1125  TROPONINI <0.03 1.10*   Glucose  Recent Labs Lab 08/08/15 1116 08/08/15 1641 08/08/15 1932 08/09/15 0013 08/09/15 0353 08/09/15 0823  GLUCAP 136* 155* 158* 117* 153* 150*    Imaging Portable Chest Xray  08/09/2015  CLINICAL DATA:  Acute respiratory failure, hypoxia, intubated patient; subdural and subarachnoid hemorrhage. EXAM: PORTABLE CHEST 1 VIEW COMPARISON:  Portable chest x-ray of November 16th 2016 FINDINGS: The lungs are adequately inflated. The interstitial markings are more prominent today bilaterally. The left lower lobe region is more dense. The heart is  top-normal in size. The pulmonary vascularity is not engorged. The endotracheal tube has been repositioned and now its tip lies 2.8 cm above the carina. The feeding tube tip projects below the inferior margin of the image. An external pacemaker-defibrillation pad is present over the lower left lung. IMPRESSION: Slight interval deterioration in the appearance of the pulmonary interstitium worrisome for mild interstitial edema. Left lower lobe atelectasis is slightly more conspicuous today. The support tubes are in reasonable position. Electronically Signed   By: David  [place] M.D.   On: 08/09/2015 08:44   Portable Chest Xray  08/08/2015  CLINICAL DATA:  Acute respiratory failure with hypoxemia. EXAM: PORTABLE CHEST 1 VIEW COMPARISON:  Chest x-ray dated 02/21/2010 FINDINGS: Endotracheal tube is at the origin of the right mainstem bronchus and needs to be retracted 3-4 cm. Feeding tube is below the diaphragm. The patient has developed small bilateral pleural effusions. Heart size and pulmonary vascularity are normal. IMPRESSION: 1. The ET tube in the proximal right mainstem bronchus. 2. Small bilateral pleural effusions. 3. Critical Value/emergent results were called by telephone at the time of interpretation on 08/08/2015 at 10:11 am to Clydie Braun, [place], who verbally acknowledged these results. Electronically Signed   By: Francene Boyers M.D.   On: 08/08/2015 10:12     ASSESSMENT / PLAN:  PULMONARY OETT 11/16>> A: Aspiration pna Acute hypoxic Resp failure due to blockage of airway P:   Vent bundle Abx for aspiration SBTs   CARDIOVASCULAR CVL A:  Wide complex tachycardia -favor NSVT with aberrancy rather than VT(suspect airway obstruction culprit) Cardiomyopathy ? takatsubo P:  Amio gtt per cards BB as needed Not a candidate for invasive wu  RENAL A:  AKI on CKD - baseline 1.4 Hyperkalemia - resolved Hypernatremia/ hyperchloremia Hypophos P:   Dc NACl Add free water + d5W @ 50/h Avoid  nephrotoxins Monitor lytes & replace as needed  GASTROINTESTINAL A:   Aspiration  P:   PPI Tube feeds  HEMATOLOGIC A:   Monitor for anemia P:  CBC  INFECTIOUS A:   Suspected aspiration P:   BCx2 11/16>> UC 11/16>> Sputum/16>> Abx:  11/16 unasyn>>  ENDOCRINE A: Hyperglycemia  P:   SSI  NEUROLOGIC A:   AMS, no follows commands post fall with SDH/SAH Intubated 11/16 for resp insuff Sedate for tube tolerance P:   RASS goal: -1 Intermittent sedation with fent Avoid benzos if possible   FAMILY  - Updates: SOn notified by phone 11/16 of need to intubate.  - Inter-disciplinary family meet or Palliative Care meeting due by:  day 7    TODAY'S SUMMARY:   81 yo WF who ermains encephalopathic 6 ds after SDH/SAH  No recurrence of arrythmias on amio gtt, new cardiomyopathy ? Stress related Will discuss goals of care with son  The patient is critically ill with multiple organ systems failure and requires high complexity decision making for assessment and support, frequent evaluation and titration of therapies, application of advanced monitoring technologies and extensive interpretation of multiple databases. Critical Care Time devoted to patient care services described in this note independent of APP time is 35 minutes.   Cyril Mourningakesh Alva MD. Tonny BollmanFCCP. [facility] Pager [phone] If no response call [phone]     08/09/2015, 8:46 AM

## 2015-08-09 NOTE — Progress Notes (Addendum)
Patient ID: Lisa SnellenFaye K Wayment, female   DOB: 08/19/1927, 81 y.o.   MRN: [id number] BP 146/68 mmHg  Pulse 99  Temp(Src) 99.1 F (37.3 C) (Axillary)  Resp 33  Ht 5' (1.524 m)  Wt 69.8 kg (153 lb 14.1 oz)  BMI 30.05 kg/m2  SpO2 100% Remains intubated, not responsive. Is being sedated.  Will move extremities +cough, pupils reactive to light Exam stable. Heart condition is grave. EF of 15-20%. Anteroseptal wall motion abnormality. Poor prognosis.

## 2015-08-10 ENCOUNTER — Encounter (HOSPITAL_COMMUNITY): Payer: Self-pay

## 2015-08-10 ENCOUNTER — Inpatient Hospital Stay (HOSPITAL_COMMUNITY): Payer: Medicare Other

## 2015-08-10 DIAGNOSIS — J9601 Acute respiratory failure with hypoxia: Secondary | ICD-10-CM | POA: Diagnosis present

## 2015-08-10 DIAGNOSIS — E870 Hyperosmolality and hypernatremia: Secondary | ICD-10-CM

## 2015-08-10 LAB — GLUCOSE, CAPILLARY
GLUCOSE-CAPILLARY: 132 mg/dL — AB (ref 65–99)
GLUCOSE-CAPILLARY: 147 mg/dL — AB (ref 65–99)
GLUCOSE-CAPILLARY: 151 mg/dL — AB (ref 65–99)
Glucose-Capillary: 150 mg/dL — ABNORMAL HIGH (ref 65–99)
Glucose-Capillary: 153 mg/dL — ABNORMAL HIGH (ref 65–99)
Glucose-Capillary: 131 mg/dL — ABNORMAL HIGH (ref 65–99)
Glucose-Capillary: 149 mg/dL — ABNORMAL HIGH (ref 65–99)

## 2015-08-10 LAB — COMPREHENSIVE METABOLIC PANEL
ALBUMIN: 1.8 g/dL — AB (ref 3.5–5.0)
ALK PHOS: 68 U/L (ref 38–126)
ALT: 89 U/L — ABNORMAL HIGH (ref 14–54)
ANION GAP: 10 (ref 5–15)
AST: 80 U/L — ABNORMAL HIGH (ref 15–41)
BUN: 50 mg/dL — ABNORMAL HIGH (ref 6–20)
CALCIUM: 7.6 mg/dL — AB (ref 8.9–10.3)
CO2: 18 mmol/L — AB (ref 22–32)
Chloride: 119 mmol/L — ABNORMAL HIGH (ref 101–111)
Creatinine, Ser: 1.6 mg/dL — ABNORMAL HIGH (ref 0.44–1.00)
GFR calc non Af Amer: 28 mL/min — ABNORMAL LOW (ref >60)
GFR, EST AFRICAN AMERICAN: 33 mL/min — AB (ref >60)
GLUCOSE: 163 mg/dL — AB (ref 65–99)
POTASSIUM: 3.4 mmol/L — AB (ref 3.5–5.1)
Sodium: 147 mmol/L — ABNORMAL HIGH (ref 135–145)
TOTAL PROTEIN: 5.2 g/dL — AB (ref 6.5–8.1)
Total Bilirubin: 0.9 mg/dL (ref 0.3–1.2)

## 2015-08-10 LAB — PHOSPHORUS: PHOSPHORUS: 2.1 mg/dL — AB (ref 2.5–4.6)

## 2015-08-10 LAB — MAGNESIUM: Magnesium: 1.4 mg/dL — ABNORMAL LOW (ref 1.7–2.4)

## 2015-08-10 MED ORDER — POTASSIUM PHOSPHATES 15 MMOLE/5ML IV SOLN
30.0000 mmol | Freq: Once | INTRAVENOUS | Status: AC
  Administered 2015-08-10: 30 mmol via INTRAVENOUS
  Filled 2015-08-10: qty 10

## 2015-08-10 MED ORDER — MAGNESIUM SULFATE 2 GM/50ML IV SOLN
2.0000 g | Freq: Once | INTRAVENOUS | Status: AC
  Administered 2015-08-10: 2 g via INTRAVENOUS
  Filled 2015-08-10: qty 50

## 2015-08-10 MED ORDER — POTASSIUM CHLORIDE 20 MEQ/15ML (10%) PO SOLN
40.0000 meq | Freq: Once | ORAL | Status: AC
  Administered 2015-08-10: 40 meq
  Filled 2015-08-10: qty 30

## 2015-08-10 MED ORDER — SODIUM BICARBONATE 650 MG PO TABS
650.0000 mg | ORAL_TABLET | Freq: Once | ORAL | Status: AC
  Administered 2015-08-10: 650 mg via ORAL
  Filled 2015-08-10: qty 1

## 2015-08-10 MED ORDER — PANCRELIPASE (LIP-PROT-AMYL) 12000-38000 UNITS PO CPEP
2.0000 | ORAL_CAPSULE | Freq: Once | ORAL | Status: AC
  Administered 2015-08-10: 24000 [IU] via ORAL
  Filled 2015-08-10: qty 2

## 2015-08-10 NOTE — Progress Notes (Signed)
Patient ID: Lisa [name], female   DOB: 10/25/1926, 81 y.o.   MRN: [id number] BP 111/56 mmHg  Pulse 80  Temp(Src) 99.1 F (37.3 C) (Axillary)  Resp 30  Ht 5' (1.524 m)  Wt 72.1 kg (158 lb 15.2 oz)  BMI 31.04 kg/m2  SpO2 100% Not responsive, not following commands Pupils reactive No real change. Plan is to fully support thru the weekend, and if no improvement by Monday to withdraw support.

## 2015-08-10 NOTE — Progress Notes (Signed)
PULMONARY / CRITICAL CARE MEDICINE   Name: Lisa SnellenFaye K Braley MRN: [id number] DOB: 08/08/1927    ADMISSION DATE:  08/03/2015 CONSULTATION DATE:  11/16   REFERRING MD :  NS  CHIEF COMPLAINT:  AMS  INITIAL PRESENTATION:  81 yo WF who fell 08/03/15 and was [facility] 11/11 with SDH/SAH after fall and was accepted to NS service. No surgical interventions required. On 11/16 she developed wide complex tachycardia that required shock x 2 and amiodarone injection to break to SR. She was in obvious respiratory distress due to aspiration Emergently intubated and large mucoid debris suctioned out of her airway   STUDIES:  Echo 11/16 - EF 15%, RVSP 65 11/13 head CT >> stable BILATERAL  frontal intracerebral hematomas,  surrounding edema is more prominent   SIGNIFICANT EVENTS: 11/11 fall with sah/sdh 11/16 Wide complex tachycardia, cardioversion x 2 11/16 intubated 11/17 runs of tachy on amio    SUBJECTIVE:  afebrile Remains poorly responsive Good UO Back in nSR on amio gtt  VITAL SIGNS: Temp:  [96.8 F (36 C)-99.1 F (37.3 C)] 96.8 F (36 C) (11/18 0800) Pulse Rate:  [81-148] 84 (11/18 0900) Resp:  [22-36] 29 (11/18 0900) BP: (114-153)/(53-80) 128/65 mmHg (11/18 0900) SpO2:  [99 %-100 %] 100 % (11/18 0900) FiO2 (%):  [40 %] 40 % (11/18 0800) Weight:  [158 lb 15.2 oz (72.1 kg)] 158 lb 15.2 oz (72.1 kg) (11/18 0500) HEMODYNAMICS:   VENTILATOR SETTINGS: Vent Mode:  [-] CPAP;PSV FiO2 (%):  [40 %] 40 % Set Rate:  [14 bmp-15 bmp] 15 bmp Vt Set:  [450 mL] 450 mL PEEP:  [5 cmH20] 5 cmH20 Pressure Support:  [15 cmH20] 15 cmH20 Plateau Pressure:  [17 cmH20-22 cmH20] 17 cmH20 INTAKE / OUTPUT:  Intake/Output Summary (Last 24 hours) at 08/10/15 0919 Last data filed at 08/10/15 0600  Gross per 24 hour  Intake   2485 ml  Output   1655 ml  Net    830 ml    PHYSICAL EXAMINATION: General: Frail elderly female,  poorly responsive Neuro: does Not follows commands, MAE x 4,   HEENT:  Raccoon eyes, perl 3 mm, dry oral mucosa Cardiovascular:  s1s2 nml, no murmur Lungs:  Decreased bs bases Abdomen: soft + bs Musculoskeletal: no overt deformities  Skin:  Warm and dry  LABS:  CBC  Recent Labs Lab 08/03/15 2022 08/08/15 1125 08/09/15 0337  WBC 15.7* 14.0* 12.0*  HGB 9.7* 8.5* 8.8*  HCT 30.3* 27.1* 27.9*  PLT 225 216 166   Coag's  Recent Labs Lab 08/03/15 1628 08/03/15 2022  APTT 39* 42*  INR 1.28 1.20   BMET  Recent Labs Lab 08/09/15 0337 08/09/15 1640 08/10/15 0600  NA 155* 153* 147*  K 4.9 4.0 3.4*  CL >130* 127* 119*  CO2 15* 17* 18*  BUN 59* 58* 50*  CREATININE 1.77* 1.76* 1.60*  GLUCOSE 166* 182* 163*   Electrolytes  Recent Labs Lab 08/09/15 0337 08/09/15 1640 08/10/15 0600  CALCIUM 8.2* 8.0* 7.6*  MG 1.6*  --  1.4*  PHOS 1.5*  --  2.1*   Sepsis Markers No results for input(s): LATICACIDVEN, PROCALCITON, O2SATVEN in the last 168 hours. ABG  Recent Labs Lab 08/08/15 1040  PHART 7.304*  PCO2ART 26.9*  PO2ART 306*   Liver Enzymes  Recent Labs Lab 08/03/15 1628 08/03/15 2022 08/10/15 0600  AST 30 32 80*  ALT 12* 14 89*  ALKPHOS 69 75 68  BILITOT 0.3 0.5 0.9  ALBUMIN 3.4*  3.3* 1.8*   Cardiac Enzymes  Recent Labs Lab 08/03/15 1628 08/08/15 1125  TROPONINI <0.03 1.10*   Glucose  Recent Labs Lab 08/09/15 1121 08/09/15 1553 08/09/15 2004 08/10/15 0004 08/10/15 0337 08/10/15 0719  GLUCAP 168* 150* 159* 147* 150* 153*    Imaging Dg Chest Port 1 View  08/10/2015  CLINICAL DATA:  Acute respiratory failure, recent subarachnoid and subdural hemorrhages EXAM: PORTABLE CHEST 1 VIEW COMPARISON:  Portable chest x-ray of August 09, 2015 FINDINGS: The lungs are well-expanded. The interstitial markings are slightly less conspicuous. The retrocardiac region on the left is less dense with now a portion of the hemidiaphragm visible. There is stable apical pleural thickening bilaterally. The cardiac  silhouette remains enlarged. The central pulmonary vascularity is prominent but the cephalization has decreased. The endotracheal tube tip lies approximately 4 cm above the carina. The feeding tube tip projects below the inferior margin of the image. IMPRESSION: Further interval decrease in pulmonary interstitial edema. Improving left lower lobe atelectasis or pneumonia. Persistent trace left pleural effusion. The support tubes are in reasonable position where visualized. Electronically Signed   By: David  [place] M.D.   On: 08/10/2015 07:57     ASSESSMENT / PLAN:  PULMONARY OETT 11/16>> A: Aspiration pna Acute hypoxic Resp failure due to blockage of airway P:   SBTs , current goal may be for one way extubation once mental status improves  CARDIOVASCULAR CVL A:  Wide complex tachycardia -favor NSVT with aberrancy rather than VT(suspect airway obstruction culprit) Cardiomyopathy ? Takatsubo, stress induced P:  Amio gtt per cards BB as needed Not a candidate for invasive wu  RENAL A:  AKI on CKD - baseline 1.4 Hyperkalemia - resolved Hypernatremia/ hyperchloremia -improving Hypophos Hypomag P:   Ct free water + d5W @ 50/h - dc once Na lower Avoid nephrotoxins Monitor lytes & replace as needed  GASTROINTESTINAL A:   Aspiration  P:   PPI Tube feeds  HEMATOLOGIC A:   Monitor for anemia P:  CBC  INFECTIOUS A:   Suspected aspiration P:   Sputum/16>> GNR >> Abx:  11/16 unasyn>>  ENDOCRINE A: Hyperglycemia  P:   SSI  NEUROLOGIC A:   AMS, no follows commands post fall with SDH/SAH Intubated 11/16 for resp insuff Sedate for tube tolerance P:   RASS goal: 0 Intermittent sedation with fent Avoid benzos    FAMILY  - Updates: SOn 11/17  - Inter-disciplinary family meet or Palliative Care meeting due by: done 11/17  discussed goals of care with son - she was functional prior, would not want permanent tubes   TODAY'S SUMMARY:   81 yo WF who ermains  encephalopathic 7 ds after SDH/SAH   On amio gtt for SVT with aberrancy, new cardiomyopathy ? Stress related   The patient is critically ill with multiple organ systems failure and requires high complexity decision making for assessment and support, frequent evaluation and titration of therapies, application of advanced monitoring technologies and extensive interpretation of multiple databases. Critical Care Time devoted to patient care services described in this note independent of APP time is 32 minutes.   Cyril Mourning MD. Tonny Bollman. [facility] Pager [phone] If no response call [phone]     08/10/2015, 9:19 AM

## 2015-08-10 NOTE — Progress Notes (Signed)
     SUBJECTIVE: intubated, not responsive.   Runs of tachycardia overnight.   Tele: NSR  BP 115/53 mmHg  Pulse 85  Temp(Src) 98.8 F (37.1 C) (Oral)  Resp 32  Ht 5' (1.524 m)  Wt 158 lb 15.2 oz (72.1 kg)  BMI 31.04 kg/m2  SpO2 100%  Intake/Output Summary (Last 24 hours) at 08/10/15 0755 Last data filed at 08/10/15 0600  Gross per 24 hour  Intake 2768.4 ml  Output   2155 ml  Net  613.4 ml    PHYSICAL EXAM General: Intubated, not responsive.  Neck: No JVD. No masses noted.  Lungs: Coarse mechanical BS bilaterally  Heart: RRR with no murmurs noted. Abdomen: Bowel sounds are present. Soft, non-tender.  Extremities: No lower extremity edema.   LABS: Basic Metabolic Panel:  Recent Labs  16/06/9610/17/16 0337 08/09/15 1640 08/10/15 0600  NA 155* 153* 147*  K 4.9 4.0 3.4*  CL >130* 127* 119*  CO2 15* 17* 18*  GLUCOSE 166* 182* 163*  BUN 59* 58* 50*  CREATININE 1.77* 1.76* 1.60*  CALCIUM 8.2* 8.0* 7.6*  MG 1.6*  --  1.4*  PHOS 1.5*  --  2.1*   CBC:  Recent Labs  08/08/15 1125 08/09/15 0337  WBC 14.0* 12.0*  HGB 8.5* 8.8*  HCT 27.1* 27.9*  MCV 96.4 96.2  PLT 216 166   Cardiac Enzymes:  Recent Labs  08/08/15 1125  TROPONINI 1.10*   Current Meds: . ampicillin-sulbactam (UNASYN) IV  1.5 g Intravenous Q12H  . antiseptic oral rinse  7 mL Mouth Rinse 10 times per day  . calcium-vitamin D  1 tablet Oral TID  . chlorhexidine gluconate  15 mL Mouth Rinse BID  . free water  200 mL Per Tube Q4H  . insulin aspart  0-9 Units Subcutaneous 6 times per day  . metoprolol  2.5 mg Intravenous 4 times per day  . multivitamin-lutein  1 capsule Oral BID  . pantoprazole sodium  40 mg Per Tube Q1200     ASSESSMENT AND PLAN: 81 yo female admitted following fall with head trauma sustaining intracranial hemorrhage, intubated and not responsive. Cardiology consulted 08/08/15 after cardiac/resp arrest with wide complex tachycardia. A large mucoid plug was suctioned out at the  time of her arrest. She was shocked twice and given a bolus of amiodarone. She has been on IV amiodarone since 08/08/15 with IV Lopressor.   1. Respiratory vs cardiac arrest with ventricular tachycardia vs SVT with aberration with underlying LBBB. I think this was most likely SVT. Sinus on amiodarone drip and IV metoprolol this am. Runs of SVT overnight. Echo 08/08/15 with LVEF=15-20%. She is not a good candidate for an invasive cardiac workup at this point. I think it is reasonable to continue the IV amiodarone and IV metoprolol for now. PCCM is following.   2. Cardiomyopathy: This could be a stress induced cardiomyopathy. No prior LVEF assessment before admission. Plan for medical therapy only without invasive evaluation.  - 3. Subarachnoid and subdural hematoma: Neurosurgery following. Prognosis is poor.      MCALHANY,CHRISTOPHER  11/18/20167:55 AM

## 2015-08-10 NOTE — Progress Notes (Signed)
Patient placed to full time settings due to increased RR. RT will continue to monitor.

## 2015-08-11 ENCOUNTER — Inpatient Hospital Stay (HOSPITAL_COMMUNITY): Payer: Medicare Other

## 2015-08-11 DIAGNOSIS — I609 Nontraumatic subarachnoid hemorrhage, unspecified: Secondary | ICD-10-CM

## 2015-08-11 LAB — CULTURE, RESPIRATORY

## 2015-08-11 LAB — CBC
HEMATOCRIT: 27.7 % — AB (ref 36.0–46.0)
Hemoglobin: 8.7 g/dL — ABNORMAL LOW (ref 12.0–15.0)
MCH: 29.6 pg (ref 26.0–34.0)
MCHC: 31.4 g/dL (ref 30.0–36.0)
MCV: 94.2 fL (ref 78.0–100.0)
PLATELETS: 140 10*3/uL — AB (ref 150–400)
RBC: 2.94 MIL/uL — ABNORMAL LOW (ref 3.87–5.11)
RDW: 17.4 % — AB (ref 11.5–15.5)
WBC: 11.1 10*3/uL — AB (ref 4.0–10.5)

## 2015-08-11 LAB — GLUCOSE, CAPILLARY
GLUCOSE-CAPILLARY: 109 mg/dL — AB (ref 65–99)
GLUCOSE-CAPILLARY: 139 mg/dL — AB (ref 65–99)
GLUCOSE-CAPILLARY: 141 mg/dL — AB (ref 65–99)
GLUCOSE-CAPILLARY: 149 mg/dL — AB (ref 65–99)
Glucose-Capillary: 110 mg/dL — ABNORMAL HIGH (ref 65–99)
Glucose-Capillary: 94 mg/dL (ref 65–99)

## 2015-08-11 LAB — BASIC METABOLIC PANEL
Anion gap: 13 (ref 5–15)
BUN: 48 mg/dL — AB (ref 6–20)
CHLORIDE: 113 mmol/L — AB (ref 101–111)
CO2: 17 mmol/L — AB (ref 22–32)
CREATININE: 1.68 mg/dL — AB (ref 0.44–1.00)
Calcium: 7.4 mg/dL — ABNORMAL LOW (ref 8.9–10.3)
GFR calc Af Amer: 31 mL/min — ABNORMAL LOW (ref >60)
GFR calc non Af Amer: 26 mL/min — ABNORMAL LOW (ref >60)
GLUCOSE: 120 mg/dL — AB (ref 65–99)
POTASSIUM: 4.4 mmol/L (ref 3.5–5.1)
SODIUM: 143 mmol/L (ref 135–145)

## 2015-08-11 LAB — CULTURE, RESPIRATORY W GRAM STAIN

## 2015-08-11 MED ORDER — SODIUM CHLORIDE 0.9 % IV SOLN
INTRAVENOUS | Status: DC
Start: 2015-08-11 — End: 2015-08-22
  Administered 2015-08-11 – 2015-08-22 (×4): via INTRAVENOUS

## 2015-08-11 MED ORDER — PANCRELIPASE (LIP-PROT-AMYL) 12000-38000 UNITS PO CPEP
2.0000 | ORAL_CAPSULE | Freq: Once | ORAL | Status: AC
  Administered 2015-08-11: 24000 [IU] via ORAL
  Filled 2015-08-11: qty 2

## 2015-08-11 MED ORDER — SODIUM BICARBONATE 650 MG PO TABS
650.0000 mg | ORAL_TABLET | Freq: Once | ORAL | Status: AC
  Administered 2015-08-11: 650 mg via ORAL
  Filled 2015-08-11: qty 1

## 2015-08-11 NOTE — Progress Notes (Signed)
Patient ID: Lisa SnellenFaye K Bernardy, female   DOB: 06/08/1927, 81 y.o.   MRN: [id number] Neuro unchanged. Continue with support care. Decision about further treatment to be done next week by dr Franky Machoabbell

## 2015-08-11 NOTE — Progress Notes (Signed)
PULMONARY / CRITICAL CARE MEDICINE   Name: Lisa SnellenFaye K [name] MRN: [id number] DOB: 12/25/1926    ADMISSION DATE:  08/03/2015 CONSULTATION DATE:  11/16   REFERRING MD :  NS  CHIEF COMPLAINT:  AMS  INITIAL PRESENTATION:  81 yo WF who fell 08/03/15 and was [facility]/11 with SDH/SAH after fall and was accepted to NS service. No surgical interventions required. On 11/16 she developed wide complex tachycardia that required shock x 2 and amiodarone injection to break to SR. She was in obvious respiratory distress due to aspiration Emergently intubated and large mucoid debris suctioned out of her airway   STUDIES:  Echo 11/16 - EF 15%, RVSP 65 11/13 head CT >> stable BILATERAL  frontal intracerebral hematomas,  surrounding edema is more prominent   SIGNIFICANT EVENTS: 11/11 fall with sah/sdh 11/16 Wide complex tachycardia, cardioversion x 2 11/16 intubated 11/17 runs of tachy on amio    SUBJECTIVE:  No neurological changes  VITAL SIGNS: Temp:  [97.4 F (36.3 C)-99.1 F (37.3 C)] 98.8 F (37.1 C) (11/19 0743) Pulse Rate:  [74-90] 78 (11/19 0800) Resp:  [24-35] 28 (11/19 0800) BP: (97-134)/(44-70) 125/60 mmHg (11/19 0800) SpO2:  [100 %] 100 % (11/19 0800) FiO2 (%):  [40 %] 40 % (11/19 0800) Weight:  [73.4 kg (161 lb 13.1 oz)] 73.4 kg (161 lb 13.1 oz) (11/19 0413) HEMODYNAMICS:   VENTILATOR SETTINGS: Vent Mode:  [-] PRVC FiO2 (%):  [40 %] 40 % Set Rate:  [15 bmp] 15 bmp Vt Set:  [450 mL] 450 mL PEEP:  [5 cmH20] 5 cmH20 Plateau Pressure:  [18 cmH20-25 cmH20] 18 cmH20 INTAKE / OUTPUT:  Intake/Output Summary (Last 24 hours) at 08/11/15 0939 Last data filed at 08/11/15 0800  Gross per 24 hour  Intake 2988.61 ml  Output   1155 ml  Net 1833.61 ml    PHYSICAL EXAMINATION: General: Frail elderly female,  poorly responsive Neuro: does Not follows commands, MAE x 4,  HEENT:  Raccoon eyes, perl 3 mm, dry oral mucosa Cardiovascular:  s1s2 nml, no murmur Lungs:   Decreased bs bases Abdomen: soft + bs Musculoskeletal: no overt deformities  Skin:  Warm and dry  LABS:  CBC  Recent Labs Lab 08/08/15 1125 08/09/15 0337 08/11/15 0436  WBC 14.0* 12.0* 11.1*  HGB 8.5* 8.8* 8.7*  HCT 27.1* 27.9* 27.7*  PLT 216 166 140*   Coag's No results for input(s): APTT, INR in the last 168 hours. BMET  Recent Labs Lab 08/09/15 1640 08/10/15 0600 08/11/15 0436  NA 153* 147* 143  K 4.0 3.4* 4.4  CL 127* 119* 113*  CO2 17* 18* 17*  BUN 58* 50* 48*  CREATININE 1.76* 1.60* 1.68*  GLUCOSE 182* 163* 120*   Electrolytes  Recent Labs Lab 08/09/15 0337 08/09/15 1640 08/10/15 0600 08/11/15 0436  CALCIUM 8.2* 8.0* 7.6* 7.4*  MG 1.6*  --  1.4*  --   PHOS 1.5*  --  2.1*  --    Sepsis Markers No results for input(s): LATICACIDVEN, PROCALCITON, O2SATVEN in the last 168 hours. ABG  Recent Labs Lab 08/08/15 1040  PHART 7.304*  PCO2ART 26.9*  PO2ART 306*   Liver Enzymes  Recent Labs Lab 08/10/15 0600  AST 80*  ALT 89*  ALKPHOS 68  BILITOT 0.9  ALBUMIN 1.8*   Cardiac Enzymes  Recent Labs Lab 08/08/15 1125  TROPONINI 1.10*   Glucose  Recent Labs Lab 08/10/15 1111 08/10/15 1601 08/10/15 1956 08/10/15 2337 08/11/15 0333 08/11/15 0741  GLUCAP 131* 132* 149* 151* 109* 110*    Imaging Dg Chest Port 1 View  08/11/2015  CLINICAL DATA:  Acute respiratory failure.  Subsequent encounter. EXAM: PORTABLE CHEST 1 VIEW COMPARISON:  08/10/2015 and older studies. FINDINGS: Since the most recent prior exam, there is been slight interval improvement. There is less opacity at the left lung base likely due to improved atelectasis. Probable small effusions persist at both lung bases. Interstitial markings are mildly prominent, but now appear to be baseline for this patient. There is persistent scarring at the apices. No pneumothorax. Endotracheal tube and enteric feeding tube are stable and well positioned. IMPRESSION: 1. No evidence of residual  interstitial edema. 2. Lung base opacity, left greater than right, has mildly improved from the previous day's study consistent with improved atelectasis. Small residual pleural effusions are suspected. 3. No new lung abnormalities. 4. Support apparatus is stable and well positioned. Electronically Signed   By: Amie Portland M.D.   On: 08/11/2015 08:01     ASSESSMENT / PLAN:  PULMONARY OETT 11/16>> A: Aspiration pna Acute hypoxic Resp failure due to blockage of airway P:   SBTs, current goal is for one way extubation if mental status improves. If no change then likely move to withdrawal of care on 11/21  CARDIOVASCULAR CVL A:  Wide complex tachycardia -favor NSVT with aberrancy rather than VT(suspect airway obstruction culprit) Cardiomyopathy ? Takatsubo, stress induced P:  Amio gtt per cards BB as needed Not a candidate for invasive wu  RENAL A:  AKI on CKD - baseline 1.4 Hyperkalemia - resolved Hypernatremia/ hyperchloremia -improving Hypophos Hypomag P:   Ct free water  D/c d5w Avoid nephrotoxins Monitor lytes & replace as needed  GASTROINTESTINAL A:   Aspiration  P:   PPI Tube feeds  HEMATOLOGIC A:   Monitor for anemia P:  CBC  INFECTIOUS A:   Suspected aspiration P:   Sputum/16>> GNR >> H flu Abx:  11/16 unasyn>>  ENDOCRINE A: Hyperglycemia  P:   SSI  NEUROLOGIC A:   AMS, no follows commands post fall with SDH/SAH Intubated 11/16 for resp insuff Sedate for tube tolerance P:   RASS goal: 0 Intermittent sedation with fent, minimize Avoid benzos    FAMILY  - Updates: Son at bedside 11/19  - Inter-disciplinary family meet or Palliative Care meeting due by: done 11/17  discussed goals of care with son - she was functional prior, would not want permanent tubes   Independent CC time 35 minutes   Levy Pupa, MD, PhD 08/11/2015, 9:53 AM [facility] [phone] or if no answer [phone]

## 2015-08-11 NOTE — Progress Notes (Signed)
Spoke with Critical Care MD regarding feeding tube remaining clogged after 1 instillation of pancrease slurry. Order received to repeat pancrease slurry.

## 2015-08-11 NOTE — Progress Notes (Signed)
Placed patient on wean PSV 10/5 40% RR increased to 45 BPM, MV increased to 24 L/M, for 5 minutes then patient became apneic then she started breathing 45 BPM, placed patient back on PRVC.

## 2015-08-11 NOTE — Progress Notes (Signed)
ANTIBIOTIC CONSULT NOTE - FOLLOW UP  Pharmacy Consult for Unasyn Indication: pneumonia (Hflu)  Allergies  Allergen Reactions  . Ambien [Zolpidem Tartrate] Other (See Comments)    Reaction:  Hallucinations   . Oxycodone Nausea And Vomiting  . Shellfish Allergy Swelling and Other (See Comments)    Pts mouth and face swells.   . Ace Inhibitors Cough  . Daypro [Oxaprozin] Other (See Comments)    GI upset  . Norvasc [Amlodipine Besylate] Palpitations   Patient Measurements: Height: 5' (152.4 cm) Weight: 161 lb 13.1 oz (73.4 kg) IBW/kg (Calculated) : 45.5 Vital Signs: Temp: 98.8 F (37.1 C) (11/19 0743) Temp Source: Axillary (11/19 0743) BP: 125/60 mmHg (11/19 0800) Pulse Rate: 78 (11/19 0800) Intake/Output from previous day: 11/18 0701 - 11/19 0700 In: 3810 [I.V.:1802; ZO/XW:9604G/GT:1518; IV Piggyback:490] Out: 1245 [Urine:1245] Intake/Output from this shift: Total I/O In: 83.3 [I.V.:83.3] Out: -  Labs:  Recent Labs  08/08/15 1125 08/09/15 0337 08/09/15 1640 08/10/15 0600 08/11/15 0436  WBC 14.0* 12.0*  --   --  11.1*  HGB 8.5* 8.8*  --   --  8.7*  PLT 216 166  --   --  140*  CREATININE 1.82* 1.77* 1.76* 1.60* 1.68*   Estimated Creatinine Clearance: 21.5 mL/min (by C-G formula based on Cr of 1.68). No results for input(s): VANCOTROUGH, VANCOPEAK, VANCORANDOM, GENTTROUGH, GENTPEAK, GENTRANDOM, TOBRATROUGH, TOBRAPEAK, TOBRARND, AMIKACINPEAK, AMIKACINTROU, AMIKACIN in the last 72 hours.   Microbiology: Recent Results (from the past 720 hour(s))  MRSA PCR Screening     Status: None   Collection Time: 08/03/15  7:20 PM  Result Value Ref Range Status   MRSA by PCR NEGATIVE NEGATIVE Final    Comment:        The GeneXpert MRSA Assay (FDA approved for NASAL specimens only), is one component of a comprehensive MRSA colonization surveillance program. It is not intended to diagnose MRSA infection nor to guide or monitor treatment for MRSA infections.   Culture, respiratory  (NON-Expectorated)     Status: None (Preliminary result)   Collection Time: 08/08/15  1:30 PM  Result Value Ref Range Status   Specimen Description TRACHEAL ASPIRATE  Final   Special Requests NONE  Final   Gram Stain   Final    MODERATE WBC PRESENT,BOTH PMN AND MONONUCLEAR RARE SQUAMOUS EPITHELIAL CELLS PRESENT MODERATE GRAM NEGATIVE RODS Performed at [facility]    Culture   Final    ABUNDANT HAEMOPHILUS INFLUENZAE Note: BETA LACTAMASE NEGATIVE Performed at [facility]    Report Status PENDING  Incomplete    Anti-infectives    Start     Dose/Rate Route Frequency Ordered Stop   08/08/15 0945  ampicillin-sulbactam (UNASYN) 1.5 g in sodium chloride 0.9 % 50 mL IVPB     1.5 g 100 mL/hr over 30 Minutes Intravenous Every 12 hours 08/08/15 0935        Assessment: 81 year old female s/p fall with SAH and Hflu pneumonia on Unasyn day # 4.   Patient is afebrile with resolving leukocytosis. SCr has remain stable. Na 143 today.   Goal of Therapy:  Clinical resolution of infection  Plan:  Continue Unasyn 1.5g IV every 12 hours.  Monitor clinical progress, culture results, and renal function Consider adding stop date- normal DOT 7 days.   Link SnufferJessica Marycruz Boehner, PharmD, BCPS Clinical Pharmacist [phone] 08/11/2015,8:50 AM

## 2015-08-11 NOTE — Progress Notes (Signed)
SUBJECTIVE: 81 yo admitted with a fall resulting in a subdural hematoma. Has since had a cascade of events . Has been found to have   severe LV dysfunction - possibly due to stress induced cardiomyopathy.  Had a respiratory arrtest due to mucus plugging .   Had WCT - likely SVT with underlying bBB but also could have been VT  Better on amiodarone .   intubated, not responsive.    Tele: NSR  BP 125/60 mmHg  Pulse 78  Temp(Src) 98.8 F (37.1 C) (Axillary)  Resp 28  Ht 5' (1.524 m)  Wt 161 lb 13.1 oz (73.4 kg)  BMI 31.60 kg/m2  SpO2 100%  Intake/Output Summary (Last 24 hours) at 08/11/15 1005 Last data filed at 08/11/15 0925  Gross per 24 hour  Intake 2745.25 ml  Output   1210 ml  Net 1535.25 ml    PHYSICAL EXAM General: Intubated, not responsive.  Neck: No JVD. No masses noted.  Lungs: Coarse mechanical BS bilaterally  Heart: RRR with no murmurs noted. Abdomen: Bowel sounds are present. Soft, non-tender.  Extremities: No lower extremity edema.   LABS: Basic Metabolic Panel:  Recent Labs  16/06/9610/17/16 0337  08/10/15 0600 08/11/15 0436  NA 155*  < > 147* 143  K 4.9  < > 3.4* 4.4  CL >130*  < > 119* 113*  CO2 15*  < > 18* 17*  GLUCOSE 166*  < > 163* 120*  BUN 59*  < > 50* 48*  CREATININE 1.77*  < > 1.60* 1.68*  CALCIUM 8.2*  < > 7.6* 7.4*  MG 1.6*  --  1.4*  --   PHOS 1.5*  --  2.1*  --   < > = values in this interval not displayed. CBC:  Recent Labs  08/09/15 0337 08/11/15 0436  WBC 12.0* 11.1*  HGB 8.8* 8.7*  HCT 27.9* 27.7*  MCV 96.2 94.2  PLT 166 140*   Cardiac Enzymes:  Recent Labs  08/08/15 1125  TROPONINI 1.10*   Current Meds: . ampicillin-sulbactam (UNASYN) IV  1.5 g Intravenous Q12H  . antiseptic oral rinse  7 mL Mouth Rinse 10 times per day  . calcium-vitamin D  1 tablet Oral TID  . chlorhexidine gluconate  15 mL Mouth Rinse BID  . free water  200 mL Per Tube Q4H  . insulin aspart  0-9 Units Subcutaneous 6 times per day  .  metoprolol  2.5 mg Intravenous 4 times per day  . multivitamin-lutein  1 capsule Oral BID  . pantoprazole sodium  40 mg Per Tube Q1200     ASSESSMENT AND PLAN: 81 yo female admitted following fall with head trauma sustaining intracranial hemorrhage, intubated and not responsive. Cardiology consulted 08/08/15 after cardiac/resp arrest with wide complex tachycardia. A large mucoid plug was suctioned out at the time of her arrest. She was shocked twice and given a bolus of amiodarone. She has been on IV amiodarone since 08/08/15 with IV Lopressor.   1. Respiratory vs cardiac arrest with ventricular tachycardia vs SVT with aberration with underlying LBBB. I think this was most likely SVT. Sinus on amiodarone drip and IV metoprolol this am. Runs of SVT overnight. Echo 08/08/15 with LVEF=15-20%. She is not a good candidate for an invasive cardiac workup at this point. I think it is reasonable to continue the IV amiodarone and IV metoprolol for now. PCCM is following. Will reduce the dose of amio. Can change to PO once she has a working  NG tube.    2. Cardiomyopathy: This could be a stress induced cardiomyopathy. No prior LVEF assessment before admission. Plan for medical therapy only without invasive evaluation.  - 3. Subarachnoid and subdural hematoma: Neurosurgery following. Prognosis is poor.    will sign off.  Call for questions.    Alize Borrayo, Deloris Ping  11/19/201610:05 AM

## 2015-08-12 ENCOUNTER — Inpatient Hospital Stay (HOSPITAL_COMMUNITY): Payer: Medicare Other

## 2015-08-12 LAB — BASIC METABOLIC PANEL
ANION GAP: 9 (ref 5–15)
BUN: 49 mg/dL — ABNORMAL HIGH (ref 6–20)
CALCIUM: 7.4 mg/dL — AB (ref 8.9–10.3)
CHLORIDE: 112 mmol/L — AB (ref 101–111)
CO2: 18 mmol/L — ABNORMAL LOW (ref 22–32)
Creatinine, Ser: 1.53 mg/dL — ABNORMAL HIGH (ref 0.44–1.00)
GFR calc Af Amer: 34 mL/min — ABNORMAL LOW (ref >60)
GFR, EST NON AFRICAN AMERICAN: 30 mL/min — AB (ref >60)
Glucose, Bld: 148 mg/dL — ABNORMAL HIGH (ref 65–99)
POTASSIUM: 3.7 mmol/L (ref 3.5–5.1)
SODIUM: 139 mmol/L (ref 135–145)

## 2015-08-12 LAB — PHOSPHORUS: PHOSPHORUS: 3 mg/dL (ref 2.5–4.6)

## 2015-08-12 LAB — GLUCOSE, CAPILLARY
Glucose-Capillary: 148 mg/dL — ABNORMAL HIGH (ref 65–99)
Glucose-Capillary: 171 mg/dL — ABNORMAL HIGH (ref 65–99)
Glucose-Capillary: 149 mg/dL — ABNORMAL HIGH (ref 65–99)
Glucose-Capillary: 136 mg/dL — ABNORMAL HIGH (ref 65–99)
Glucose-Capillary: 123 mg/dL — ABNORMAL HIGH (ref 65–99)

## 2015-08-12 LAB — MAGNESIUM: Magnesium: 1.6 mg/dL — ABNORMAL LOW (ref 1.7–2.4)

## 2015-08-12 MED ORDER — AMIODARONE HCL 200 MG PO TABS
200.0000 mg | ORAL_TABLET | Freq: Every day | ORAL | Status: DC
  Administered 2015-08-13 – 2015-08-15 (×3): 200 mg
  Filled 2015-08-12 (×3): qty 1

## 2015-08-12 NOTE — Progress Notes (Signed)
Patient ID: Lisa SnellenFaye K [name], female   DOB: 02/19/1927, 81 y.o.   MRN: [id number] opening eyes to pain, moves all 4 extremities. Some bloody stools. Continue in the respirator. Spoke with son.

## 2015-08-12 NOTE — Progress Notes (Signed)
PULMONARY / CRITICAL CARE MEDICINE   Name: RHEN DOSSANTOS MRN: [id number] DOB: Nov 26, 1926    ADMISSION DATE:  08/03/2015 CONSULTATION DATE:  11/16   REFERRING MD :  NS  CHIEF COMPLAINT:  AMS  INITIAL PRESENTATION:  81 yo WF who fell 08/03/15 and was transferred to [facility] 11/11 with SDH/SAH after fall and was accepted to NS service. No surgical interventions required. On 11/16 she developed wide complex tachycardia that required shock x 2 and amiodarone injection to break to SR. She was in obvious respiratory distress due to aspiration Emergently intubated and large mucoid debris suctioned out of her airway   STUDIES:  Echo 11/16 - EF 15%, RVSP 65 11/13 head CT >> stable BILATERAL  frontal intracerebral hematomas,  surrounding edema is more prominent   SIGNIFICANT EVENTS: 11/11 fall with sah/sdh 11/16 Wide complex tachycardia, cardioversion x 2 11/16 intubated 11/17 runs of tachy on amio    SUBJECTIVE:  No neurological changes reported Pt has had blood tinged mucoid stools over last 24h Did not tolerate PSV yesterday, even high PS  VITAL SIGNS: Temp:  [97.5 F (36.4 C)-99 F (37.2 C)] 99 F (37.2 C) (11/20 0732) Pulse Rate:  [73-90] 85 (11/20 0740) Resp:  [24-34] 31 (11/20 0740) BP: (107-130)/(48-81) 129/57 mmHg (11/20 0740) SpO2:  [100 %] 100 % (11/20 0740) FiO2 (%):  [40 %] 40 % (11/20 0800) Weight:  [73.5 kg (162 lb 0.6 oz)] 73.5 kg (162 lb 0.6 oz) (11/20 0300) HEMODYNAMICS:   VENTILATOR SETTINGS: Vent Mode:  [-] PRVC FiO2 (%):  [40 %] 40 % Set Rate:  [15 bmp] 15 bmp Vt Set:  [450 mL] 450 mL PEEP:  [5 cmH20] 5 cmH20 Plateau Pressure:  [17 cmH20-19 cmH20] 19 cmH20 INTAKE / OUTPUT:  Intake/Output Summary (Last 24 hours) at 08/12/15 0944 Last data filed at 08/12/15 0700  Gross per 24 hour  Intake 2070.7 ml  Output   1230 ml  Net  840.7 ml    PHYSICAL EXAMINATION: General: Frail elderly female,  unresponsive Neuro: does Not follows commands, MAE x 4,  eyes spont open  HEENT:  Raccoon eyes, perl 3 mm, dry oral mucosa Cardiovascular:  s1s2 nml, no murmur Lungs:  Decreased bs bases Abdomen: soft + bs Musculoskeletal: no overt deformities  Skin:  Warm and dry  LABS:  CBC  Recent Labs Lab 08/08/15 1125 08/09/15 0337 08/11/15 0436  WBC 14.0* 12.0* 11.1*  HGB 8.5* 8.8* 8.7*  HCT 27.1* 27.9* 27.7*  PLT 216 166 140*   Coag's No results for input(s): APTT, INR in the last 168 hours. BMET  Recent Labs Lab 08/10/15 0600 08/11/15 0436 08/12/15 0330  NA 147* 143 139  K 3.4* 4.4 3.7  CL 119* 113* 112*  CO2 18* 17* 18*  BUN 50* 48* 49*  CREATININE 1.60* 1.68* 1.53*  GLUCOSE 163* 120* 148*   Electrolytes  Recent Labs Lab 08/09/15 0337  08/10/15 0600 08/11/15 0436 08/12/15 0330  CALCIUM 8.2*  < > 7.6* 7.4* 7.4*  MG 1.6*  --  1.4*  --  1.6*  PHOS 1.5*  --  2.1*  --  3.0  < > = values in this interval not displayed. Sepsis Markers No results for input(s): LATICACIDVEN, PROCALCITON, O2SATVEN in the last 168 hours. ABG  Recent Labs Lab 08/08/15 1040  PHART 7.304*  PCO2ART 26.9*  PO2ART 306*   Liver Enzymes  Recent Labs Lab 08/10/15 0600  AST 80*  ALT 89*  ALKPHOS 68  BILITOT 0.9  ALBUMIN 1.8*   Cardiac Enzymes  Recent Labs Lab 08/08/15 1125  TROPONINI 1.10*   Glucose  Recent Labs Lab 08/11/15 1150 08/11/15 1639 08/11/15 1948 08/11/15 2337 08/12/15 0337 08/12/15 0729  GLUCAP 94 149* 139* 141* 148* 171*    Imaging Dg Chest Port 1 View  08/12/2015  CLINICAL DATA:  Acute respiratory failure EXAM: PORTABLE CHEST 1 VIEW COMPARISON:  08/11/2015 FINDINGS: Endotracheal tube in good position. Feeding tube extends into the stomach with the tip not visualized. Bibasilar airspace disease left greater than right is unchanged. This may represent atelectasis or pneumonia. Negative for heart failure. IMPRESSION: Mild bibasilar airspace disease left greater than right unchanged. Endotracheal tube remains in  good position. Electronically Signed   By: Marlan Palauharles  Clark M.D.   On: 08/12/2015 09:11   Dg Abd Portable 1v  08/11/2015  CLINICAL DATA:  Evaluate feeding tube placement EXAM: PORTABLE ABDOMEN - 1 VIEW COMPARISON:  08/07/2015 FINDINGS: The tip of the feeding tube is in the expected location of the distal duodenum/ligament of Treitz. This is unchanged in configuration from the previous exam. Bowel gas pattern is nonobstructed. IMPRESSION: Tip of feeding tube is in the distal duodenum/ ligament of Treitz. Electronically Signed   By: Signa Kellaylor  Stroud M.D.   On: 08/11/2015 11:23     ASSESSMENT / PLAN:  PULMONARY OETT 11/16>> A: Aspiration pna Acute hypoxic Resp failure due to blockage of airway P:   SBTs, current goal is for one way extubation if mental status improves. If no change then likely move to withdrawal of care on 11/21. She would be a poor trach / PEG candidate.   CARDIOVASCULAR CVL A:  Wide complex tachycardia -favor NSVT with aberrancy rather than VT(suspect airway obstruction culprit) Cardiomyopathy ? Takatsubo, stress induced P:  Amio gtt per cards BB as needed Not a candidate for invasive wu  RENAL A:  AKI on CKD - baseline 1.4 Hyperkalemia - resolved Hypernatremia/ hyperchloremia -improving Hypophos Hypomag P:   Ct free water  D/c d5w Avoid nephrotoxins Monitor lytes & replace as needed  GASTROINTESTINAL A:   BRBPR P:   PPI Tube feeds Follow CBC Consider GI consult if continues or volume increases  HEMATOLOGIC A:   Monitor for anemia P:  CBC  INFECTIOUS A:   Suspected aspiration P:   Sputum/16>> GNR >> H flu Abx:  11/16 unasyn>>  ENDOCRINE A: Hyperglycemia  P:   SSI  NEUROLOGIC A:   AMS, no follows commands post fall with SDH/SAH Intubated 11/16 for resp insuff Sedate for tube tolerance P:   RASS goal: 0 Intermittent sedation with fent, minimize Avoid benzos    FAMILY  - Updates: Son at bedside 11/19 and 11/20  -  Inter-disciplinary family meet or Palliative Care meeting due by: done 11/17  discussed goals of care with son - she was functional prior, would not want permanent tubes   Independent CC time 35 minutes   Levy Pupaobert Delynn Pursley, MD, PhD 08/12/2015, 9:44 AM [facility] [phone] or if no answer [phone]

## 2015-08-12 NOTE — Progress Notes (Signed)
Attempted SBT weaning and increased PS up to 20.  RR 40's, Min vol 21.0.  Placed pt back on full vent support, RN aware.

## 2015-08-13 LAB — BASIC METABOLIC PANEL
ANION GAP: 9 (ref 5–15)
BUN: 44 mg/dL — ABNORMAL HIGH (ref 6–20)
CALCIUM: 7.8 mg/dL — AB (ref 8.9–10.3)
CO2: 19 mmol/L — ABNORMAL LOW (ref 22–32)
Chloride: 110 mmol/L (ref 101–111)
Creatinine, Ser: 1.41 mg/dL — ABNORMAL HIGH (ref 0.44–1.00)
GFR, EST AFRICAN AMERICAN: 38 mL/min — AB (ref >60)
GFR, EST NON AFRICAN AMERICAN: 33 mL/min — AB (ref >60)
GLUCOSE: 153 mg/dL — AB (ref 65–99)
POTASSIUM: 3.7 mmol/L (ref 3.5–5.1)
SODIUM: 138 mmol/L (ref 135–145)

## 2015-08-13 LAB — CBC
HEMATOCRIT: 24.5 % — AB (ref 36.0–46.0)
HEMOGLOBIN: 8 g/dL — AB (ref 12.0–15.0)
MCH: 30 pg (ref 26.0–34.0)
MCHC: 32.7 g/dL (ref 30.0–36.0)
MCV: 91.8 fL (ref 78.0–100.0)
Platelets: 131 10*3/uL — ABNORMAL LOW (ref 150–400)
RBC: 2.67 MIL/uL — ABNORMAL LOW (ref 3.87–5.11)
RDW: 16.9 % — AB (ref 11.5–15.5)
WBC: 13.6 10*3/uL — ABNORMAL HIGH (ref 4.0–10.5)

## 2015-08-13 LAB — GLUCOSE, CAPILLARY
GLUCOSE-CAPILLARY: 139 mg/dL — AB (ref 65–99)
GLUCOSE-CAPILLARY: 161 mg/dL — AB (ref 65–99)
Glucose-Capillary: 118 mg/dL — ABNORMAL HIGH (ref 65–99)
Glucose-Capillary: 134 mg/dL — ABNORMAL HIGH (ref 65–99)
Glucose-Capillary: 135 mg/dL — ABNORMAL HIGH (ref 65–99)
Glucose-Capillary: 138 mg/dL — ABNORMAL HIGH (ref 65–99)
Glucose-Capillary: 143 mg/dL — ABNORMAL HIGH (ref 65–99)

## 2015-08-13 MED ORDER — FUROSEMIDE 10 MG/ML IJ SOLN
40.0000 mg | Freq: Four times a day (QID) | INTRAMUSCULAR | Status: AC
  Administered 2015-08-13 (×2): 40 mg via INTRAVENOUS
  Filled 2015-08-13 (×2): qty 4

## 2015-08-13 MED ORDER — FENTANYL CITRATE (PF) 100 MCG/2ML IJ SOLN
50.0000 ug | INTRAMUSCULAR | Status: DC | PRN
  Administered 2015-08-13 (×2): 100 ug via INTRAVENOUS
  Administered 2015-08-13: 50 ug via INTRAVENOUS
  Administered 2015-08-13: 100 ug via INTRAVENOUS
  Administered 2015-08-13: 50 ug via INTRAVENOUS
  Administered 2015-08-14 (×4): 100 ug via INTRAVENOUS
  Administered 2015-08-14: 50 ug via INTRAVENOUS
  Filled 2015-08-13 (×11): qty 2

## 2015-08-13 NOTE — Clinical Documentation Improvement (Signed)
Critical Care  Can the diagnosis of anemia be further specified?   Acute Blood Loss Anemia, including the suspected or known cause or associated condition(s)  Acute on chronic blood loss anemia, including the suspected or known cause or associated condition(s)  Chronic blood loss anemia, including the suspected or known cause or associated condition(s)  Precipitous drop in Hematocrit, including the suspected or known cause or associated condition(s)  Other  Clinically Undetermined  Document any associated diagnoses/conditions. Please update your documentation within the medical record to reflect your response to this query. Thank you.  Supporting Information:"Subarachnoid and subdural hematoma" & "BRBPR" Lbs: Component     Latest Ref Rng 08/03/2015 08/03/2015 08/08/2015 08/09/2015         4:28 PM  8:22 PM    Hemoglobin     12.0 - 15.0 g/dL 9.7 (L) 9.7 (L) 8.5 (L) 8.8 (L)  HCT     36.0 - 46.0 % 30.0 (L) 30.3 (L) 27.1 (L) 27.9 (L)   Component     Latest Ref Rng 08/11/2015 08/13/2015           Hemoglobin     12.0 - 15.0 g/dL 8.7 (L) 8.0 (L)  HCT     36.0 - 46.0 % 27.7 (L) 24.5 (L)    Please exercise your independent, professional judgment when responding. A specific answer is not anticipated or expected.  Thank You, Nevin BloodgoodJoan B Edman Lipsey, RN, BSN, CCDS,Clinical Documentation Specialist:  [phone]  [phone]=Cell [place]- Health Information Management

## 2015-08-13 NOTE — Progress Notes (Signed)
Nutrition Follow-up  INTERVENTION:   Decrease Vital AF 1.2 to 55 ml/hr Provides: 1584 kcal (104% of needs), 99 grams protein, and 1070 ml H2O Total free water: 2270 ml  NUTRITION DIAGNOSIS:   Inadequate oral intake related to inability to eat as evidenced by NPO status. Ongoing.   GOAL:   Patient will meet greater than or equal to 90% of their needs  Met.   MONITOR:   I & O's, Vent status, Labs, Weight trends, TF tolerance  ASSESSMENT:   Pt admitted on 08/03/15 s/p fall with subarachnoid hemorrhage.  Patient is currently intubated on ventilator support MV: 14.9 L/min Temp (24hrs), Avg:98.6 F (37 C), Min:98 F (36.7 C), Max:99.3 F (37.4 C)  Medications reviewed and include: MVI, lasix, novolog, Calcium with D 200 ml H2O every 4 hours = 1200 ml CBG's: 118-161; magnesium low (1.6) 11/20 Weight increased >10 lb, pt is 21 L positive Per CCM will start to focus on comfort with likely extubation this week.   Diet Order:  Diet NPO time specified  Skin:  Reviewed, no issues  Last BM:  11/20  Height:   Ht Readings from Last 1 Encounters:  08/08/15 5' (1.524 m)   Weight:   Wt Readings from Last 1 Encounters:  08/13/15 168 lb 10.4 oz (76.5 kg)  Admission weight: 153 lb (69.5 kg) 11/15  Ideal Body Weight:  52.2 kg  BMI:  Body mass index is 32.94 kg/(m^2).  Estimated Nutritional Needs:   Kcal:  1515  Protein:  80-95 grams  Fluid:  > 1.6 L/day  EDUCATION NEEDS:   No education needs identified at this time  [place], [place], [place] Pager [phone] After Hours Pager

## 2015-08-13 NOTE — Progress Notes (Signed)
RN present during discussion between pt's son & MD. Son to talk with other family members before making decision regarding potential comfort care this week.

## 2015-08-13 NOTE — Progress Notes (Addendum)
Patient ID: Lisa [name], female   DOB: 02/03/1927, 81 y.o.   MRN: [id number] BP 135/61 mmHg  Pulse 103  Temp(Src) 97.6 F (36.4 C) (Axillary)  Resp 36  Ht 5' (1.524 m)  Wt 76.5 kg (168 lb 10.4 oz)  BMI 32.94 kg/m2  SpO2 100% Will open eyes to voice.  Did not follow commands for me, reported by her nurse today that she did follow commands earlier in the day Pupils equal round and reactive to light + cough, +corneals Has improved neurologically slightly since immediate post intubation. Agree with Dr. Ulyses JarredMcQuaid's assesment.

## 2015-08-13 NOTE — Progress Notes (Signed)
PULMONARY / CRITICAL CARE MEDICINE   Name: Lisa [name] MRN: [id number] DOB: 06/12/1927    ADMISSION DATE:  08/03/2015 CONSULTATION DATE:  11/16   REFERRING MD :  NS  CHIEF COMPLAINT:  AMS  INITIAL PRESENTATION:  81 yo WF who fell 08/03/15 and [facility] with SDH/SAH after fall and was accepted to NS service. No surgical interventions required. On 11/16 she developed wide complex tachycardia that required shock x 2 and amiodarone injection to break to SR. She was in obvious respiratory distress due to aspiration Emergently intubated and large mucoid debris suctioned out of her airway   STUDIES:  Echo 11/16 - EF 15%, RVSP 65 11/13 head CT >> stable BILATERAL  frontal intracerebral hematomas,  surrounding edema is more prominent   SIGNIFICANT EVENTS: 11/11 fall with sah/sdh 11/16 Wide complex tachycardia, cardioversion x 2 11/16 intubated 11/17 runs of tachy on amio    SUBJECTIVE:  No acute events, failing SBT   VITAL SIGNS: Temp:  [98 F (36.7 C)-99.3 F (37.4 C)] 98.4 F (36.9 C) (11/21 0755) Pulse Rate:  [76-100] 88 (11/21 0839) Resp:  [19-37] 27 (11/21 0839) BP: (114-154)/(44-74) 147/61 mmHg (11/21 0839) SpO2:  [92 %-100 %] 99 % (11/21 0839) FiO2 (%):  [40 %] 40 % (11/21 0839) Weight:  [76.5 kg (168 lb 10.4 oz)] 76.5 kg (168 lb 10.4 oz) (11/21 0431) HEMODYNAMICS:   VENTILATOR SETTINGS: Vent Mode:  [-] PRVC FiO2 (%):  [40 %] 40 % Set Rate:  [15 bmp] 15 bmp Vt Set:  [450 mL] 450 mL PEEP:  [5 cmH20] 5 cmH20 Plateau Pressure:  [12 cmH20-17 cmH20] 15 cmH20 INTAKE / OUTPUT:  Intake/Output Summary (Last 24 hours) at 08/13/15 0854 Last data filed at 08/13/15 0800  Gross per 24 hour  Intake 4370.1 ml  Output   2240 ml  Net 2130.1 ml    PHYSICAL EXAMINATION: General: Frail elderly female,  unresponsive Neuro: tracks with eyes, does not follow commands for me PULM: Rhonchi bilaterally, normal effort CV: RRR, no mgr GI: BS+, soft,  nontender MSK: no bony deformities Derm: severe anasarca bilaterally  LABS:  CBC  Recent Labs Lab 08/09/15 0337 08/11/15 0436 08/13/15 0259  WBC 12.0* 11.1* 13.6*  HGB 8.8* 8.7* 8.0*  HCT 27.9* 27.7* 24.5*  PLT 166 140* 131*   Coag's No results for input(s): APTT, INR in the last 168 hours. BMET  Recent Labs Lab 08/10/15 0600 08/11/15 0436 08/12/15 0330  NA 147* 143 139  K 3.4* 4.4 3.7  CL 119* 113* 112*  CO2 18* 17* 18*  BUN 50* 48* 49*  CREATININE 1.60* 1.68* 1.53*  GLUCOSE 163* 120* 148*   Electrolytes  Recent Labs Lab 08/09/15 0337  08/10/15 0600 08/11/15 0436 08/12/15 0330  CALCIUM 8.2*  < > 7.6* 7.4* 7.4*  MG 1.6*  --  1.4*  --  1.6*  PHOS 1.5*  --  2.1*  --  3.0  < > = values in this interval not displayed. Sepsis Markers No results for input(s): LATICACIDVEN, PROCALCITON, O2SATVEN in the last 168 hours. ABG  Recent Labs Lab 08/08/15 1040  PHART 7.304*  PCO2ART 26.9*  PO2ART 306*   Liver Enzymes  Recent Labs Lab 08/10/15 0600  AST 80*  ALT 89*  ALKPHOS 68  BILITOT 0.9  ALBUMIN 1.8*   Cardiac Enzymes  Recent Labs Lab 08/08/15 1125  TROPONINI 1.10*   Glucose  Recent Labs Lab 08/12/15 1138 08/12/15 1557 08/12/15 1939 08/13/15 08/13/15 0401  08/13/15 0724  GLUCAP 149* 136* 123* 118* 134* 161*    Imaging No results found.   ASSESSMENT / PLAN:  PULMONARY OETT 11/16>> A: Aspiration pna Acute hypoxic Resp failure > failed SBT 11/21 P:   Continue SBT daily VAP prevention protocol   CARDIOVASCULAR A:  Wide complex tachycardia > no further episodes over weekend Cardiomyopathy ? Takatsubo, stress induced vs chronic, unclear Anasarca> volume overload due to cardiomyopathy P:  Amio oral Metoprolol oral Not a candidate for invasive work up Lasix x1 dose today  RENAL A:  AKI on CKD - baseline 1.4 Hypernatremia/ hyperchloremia -improving P:   Continue free water per tube Stop d5w Avoid nephrotoxins Monitor  lytes & replace as needed  GASTROINTESTINAL A:   Bright red blood per rectum last week, none this morning P:   PPI Continue Tube feeds Follow CBC  HEMATOLOGIC A:   Monitor for anemia P:  CBC if bleeds again  INFECTIOUS A:   Aspiration pneumonia P:   Sputum/16>> GNR >> H flu Abx:  11/16 unasyn>> 11/21  ENDOCRINE A: Hyperglycemia  P:   SSI  NEUROLOGIC A:   Subdural hemorrhage, sub arachnoid hemorrhage > persistently encephalopathic 11/21 Sedate for tube tolerance P:   RASS goal: 0 Intermittent sedation with fent, minimize Avoid benzos    FAMILY  - Updates: Son at bedside 11/21 > He and I discussed her situation at length this morning.  I explained that while she may make improvement from a neurologic perspective (though we have yet to witness dramatic improvement), her respiratory failure, cardiomyopathy, renal failure and pneumonia all make it much more difficult for her to do well with continued ICU level care.  I explained that we could continue to try to optimize her respiratory status but she is nearing the "chronically critically ill" definition and the likelihood of her returning to a functional, independent baseline is near zero.  He thinks that she would not want higher level of care than she has now and that we should start to focus on her comfort and dignity alone.  He will talk to more family about her situation and will likely withdraw care this week.  - Inter-disciplinary family meet or Palliative Care meeting due by: done 11/17  discussed goals of care with son - she was functional prior, would not want permanent tubes   My CC time 35 minutes   Heber Bardwell, MD [place] [facility] Pager: [phone] Cell: [phone] After 3pm or if no response, call [phone]

## 2015-08-14 ENCOUNTER — Inpatient Hospital Stay (HOSPITAL_COMMUNITY): Payer: Medicare Other

## 2015-08-14 DIAGNOSIS — J810 Acute pulmonary edema: Secondary | ICD-10-CM

## 2015-08-14 LAB — GLUCOSE, CAPILLARY
GLUCOSE-CAPILLARY: 166 mg/dL — AB (ref 65–99)
Glucose-Capillary: 101 mg/dL — ABNORMAL HIGH (ref 65–99)
Glucose-Capillary: 159 mg/dL — ABNORMAL HIGH (ref 65–99)
Glucose-Capillary: 123 mg/dL — ABNORMAL HIGH (ref 65–99)
Glucose-Capillary: 168 mg/dL — ABNORMAL HIGH (ref 65–99)

## 2015-08-14 LAB — BASIC METABOLIC PANEL
ANION GAP: 10 (ref 5–15)
BUN: 49 mg/dL — AB (ref 6–20)
CALCIUM: 8 mg/dL — AB (ref 8.9–10.3)
CO2: 23 mmol/L (ref 22–32)
Chloride: 105 mmol/L (ref 101–111)
Creatinine, Ser: 1.57 mg/dL — ABNORMAL HIGH (ref 0.44–1.00)
GFR calc Af Amer: 33 mL/min — ABNORMAL LOW (ref >60)
GFR, EST NON AFRICAN AMERICAN: 29 mL/min — AB (ref >60)
GLUCOSE: 110 mg/dL — AB (ref 65–99)
Potassium: 3.4 mmol/L — ABNORMAL LOW (ref 3.5–5.1)
SODIUM: 138 mmol/L (ref 135–145)

## 2015-08-14 MED ORDER — FUROSEMIDE 10 MG/ML IJ SOLN
40.0000 mg | Freq: Four times a day (QID) | INTRAMUSCULAR | Status: AC
  Administered 2015-08-14 (×2): 40 mg via INTRAVENOUS
  Filled 2015-08-14 (×2): qty 4

## 2015-08-14 MED ORDER — FENTANYL CITRATE (PF) 2500 MCG/50ML IJ SOLN
25.0000 ug/h | INTRAMUSCULAR | Status: DC
  Administered 2015-08-14: 50 ug/h via INTRAVENOUS
  Filled 2015-08-14: qty 50

## 2015-08-14 MED ORDER — POTASSIUM CHLORIDE 20 MEQ/15ML (10%) PO SOLN
40.0000 meq | Freq: Once | ORAL | Status: AC
  Administered 2015-08-14: 40 meq
  Filled 2015-08-14: qty 30

## 2015-08-14 MED ORDER — FENTANYL BOLUS VIA INFUSION
25.0000 ug | INTRAVENOUS | Status: DC | PRN
  Filled 2015-08-14: qty 100

## 2015-08-14 NOTE — Progress Notes (Signed)
RT called to pt room by RN. Pt RR 40+. Pt placed back on full vent support. RT will continue to monitor.

## 2015-08-14 NOTE — Progress Notes (Signed)
Patient ID: Lisa SnellenFaye K [name], female   DOB: 05/26/1927, 81 y.o.   MRN: [id number] BP 136/56 mmHg  Pulse 94  Temp(Src) 97.8 F (36.6 C) (Axillary)  Resp 22  Ht 5' (1.524 m)  Wt 70.7 kg (155 lb 13.8 oz)  BMI 30.44 kg/m2  SpO2 100% Alert, following commands Moving upper and lower extremities well.  Significant improvement mentally.  Will encourage medical support given mental status changes.  Ccm will continue a weaning progress.

## 2015-08-14 NOTE — Progress Notes (Signed)
PULMONARY / CRITICAL CARE MEDICINE   Name: Lisa SnellenFaye K Seabrooks MRN: [id number] DOB: 11/20/1926    ADMISSION DATE:  08/03/2015 CONSULTATION DATE:  11/16   REFERRING MD :  NS  CHIEF COMPLAINT:  AMS  INITIAL PRESENTATION:  81 yo WF who fell 08/03/15 and was [facility] to [facility] 11/11 with SDH/SAH after fall and was accepted to NS service. No surgical interventions required. On 11/16 she developed wide complex tachycardia that required shock x 2 and amiodarone injection to break to SR. She was in obvious respiratory distress due to aspiration Emergently intubated and large mucoid debris suctioned out of her airway.   STUDIES:  Echo 11/16 - EF 15%, RVSP 65 11/13 head CT >> stable BILATERAL  frontal intracerebral hematomas,  surrounding edema is more prominent   SIGNIFICANT EVENTS: 11/11 fall with sah/sdh 11/16 Wide complex tachycardia, cardioversion x 2 11/16 intubated 11/17 runs of tachy on amio    SUBJECTIVE:   More awake and alert, following commands for me On Pressure support this morning  VITAL SIGNS: Temp:  [97.5 F (36.4 C)-98.9 F (37.2 C)] 97.8 F (36.6 C) (11/22 0800) Pulse Rate:  [86-104] 88 (11/22 0900) Resp:  [20-37] 21 (11/22 0900) BP: (94-160)/(38-128) 115/62 mmHg (11/22 0900) SpO2:  [85 %-100 %] 99 % (11/22 0900) FiO2 (%):  [40 %] 40 % (11/22 0717) Weight:  [70.7 kg (155 lb 13.8 oz)] 70.7 kg (155 lb 13.8 oz) (11/22 0442) HEMODYNAMICS:   VENTILATOR SETTINGS: Vent Mode:  [-] PSV;CPAP FiO2 (%):  [40 %] 40 % Set Rate:  [16 bmp] 16 bmp Vt Set:  [450 mL] 450 mL PEEP:  [5 cmH20] 5 cmH20 Pressure Support:  [15 cmH20] 15 cmH20 Plateau Pressure:  [10 cmH20-18 cmH20] 17 cmH20 INTAKE / OUTPUT:  Intake/Output Summary (Last 24 hours) at 08/14/15 0915 Last data filed at 08/14/15 0900  Gross per 24 hour  Intake   2520 ml  Output   5125 ml  Net  -2605 ml    PHYSICAL EXAMINATION: General: Frail elderly female, on vent Neuro: moves all four extremities on command  for me, grip strength 4+/5 bilaterally, nods head to simple questions (are you in pain), but does not answer CAM-ICU questions appropriately PULM: Rhonchi bilaterally, normal effort CV: RRR, no mgr GI: BS+, soft, nontender MSK: no bony deformities Derm: severe anasarca bilaterally, bruising in face over eyes  LABS:  CBC  Recent Labs Lab 08/09/15 0337 08/11/15 0436 08/13/15 0259  WBC 12.0* 11.1* 13.6*  HGB 8.8* 8.7* 8.0*  HCT 27.9* 27.7* 24.5*  PLT 166 140* 131*   Coag's No results for input(s): APTT, INR in the last 168 hours. BMET  Recent Labs Lab 08/12/15 0330 08/13/15 1101 08/14/15 0301  NA 139 138 138  K 3.7 3.7 3.4*  CL 112* 110 105  CO2 18* 19* 23  BUN 49* 44* 49*  CREATININE 1.53* 1.41* 1.57*  GLUCOSE 148* 153* 110*   Electrolytes  Recent Labs Lab 08/09/15 0337  08/10/15 0600  08/12/15 0330 08/13/15 1101 08/14/15 0301  CALCIUM 8.2*  < > 7.6*  < > 7.4* 7.8* 8.0*  MG 1.6*  --  1.4*  --  1.6*  --   --   PHOS 1.5*  --  2.1*  --  3.0  --   --   < > = values in this interval not displayed. Sepsis Markers No results for input(s): LATICACIDVEN, PROCALCITON, O2SATVEN in the last 168 hours. ABG  Recent Labs Lab 08/08/15 1040  PHART  7.304*  PCO2ART 26.9*  PO2ART 306*   Liver Enzymes  Recent Labs Lab 08/10/15 0600  AST 80*  ALT 89*  ALKPHOS 68  BILITOT 0.9  ALBUMIN 1.8*   Cardiac Enzymes  Recent Labs Lab 08/08/15 1125  TROPONINI 1.10*   Glucose  Recent Labs Lab 08/13/15 1101 08/13/15 1623 08/13/15 2018 08/13/15 2354 08/14/15 0345 08/14/15 0740  GLUCAP 138* 143* 135* 139* 101* 159*    Imaging No results found.   ASSESSMENT / PLAN:  PULMONARY OETT 11/16>> A: Aspiration pna Acute hypoxic Resp failure > failed SBT 11/21 Likely acute pulmonary edema P:   Continue SBT daily VAP prevention protocol Lasix x2  CARDIOVASCULAR A:  Wide complex tachycardia > no further episodes recently Cardiomyopathy ? Takatsubo, stress  induced vs chronic, unclear Anasarca> volume overload due to cardiomyopathy P:  Amio oral Metoprolol oral Lasix x2 doses today Tele  RENAL A:  AKI on CKD - baseline 1.4 Hypokalemia P:   Continue free water per tube Replete KCL Avoid nephrotoxins Monitor lytes & replace as needed  GASTROINTESTINAL A:   Bright red blood per rectum last week, none this morning P:   PPI Continue Tube feeds Follow CBC  HEMATOLOGIC A:   Monitor for anemia P:  CBC if bleeds again  INFECTIOUS A:   Aspiration pneumonia P:   Sputum/16>> GNR >> H flu Abx:  11/16 unasyn>> 11/21  ENDOCRINE A: Hyperglycemia  P:   SSI  NEUROLOGIC A:   Subdural hemorrhage, sub arachnoid hemorrhage > improved ability to follow commands today Sedate for tube tolerance P:   RASS goal: 0 Intermittent sedation with fent, minimal dosing   FAMILY  - Updates: Son at bedside 11/22 > See my note from 11/21.  Today she shows improvement in that she is following commands.  However, due to her multiple medical issues her overall prognosis is poor.  We are aiming to successfully extubate her and not re-intubate.  The son agrees that we should not keep her on the vent for longer than 3-4 days if she has not made improvement.    My CC time 9 minutes   Heber Muldrow, MD [place] [facility] Pager: [phone] Cell: [phone] After 3pm or if no response, call [phone]

## 2015-08-14 NOTE — Progress Notes (Signed)
Pt remains on ventilator; making some cognitive improvements today--following commands, per son, RN.  Son at bedside; offered emotional support.  Plan to continue to attempt to wean from ventilator.  Will follow progress.  Son expressed gratitude to nursing staff for their excellent care of his mother.    Quintella BatonJulie W. Toi Stelly, RN, BSN  Trauma/Neuro ICU Case Manager [phone]

## 2015-08-15 ENCOUNTER — Inpatient Hospital Stay (HOSPITAL_COMMUNITY): Payer: Medicare Other

## 2015-08-15 DIAGNOSIS — I427 Cardiomyopathy due to drug and external agent: Secondary | ICD-10-CM

## 2015-08-15 LAB — BASIC METABOLIC PANEL
Anion gap: 11 (ref 5–15)
BUN: 56 mg/dL — AB (ref 6–20)
CALCIUM: 8 mg/dL — AB (ref 8.9–10.3)
CO2: 25 mmol/L (ref 22–32)
Chloride: 104 mmol/L (ref 101–111)
Creatinine, Ser: 1.58 mg/dL — ABNORMAL HIGH (ref 0.44–1.00)
GFR calc Af Amer: 33 mL/min — ABNORMAL LOW (ref >60)
GFR, EST NON AFRICAN AMERICAN: 28 mL/min — AB (ref >60)
GLUCOSE: 164 mg/dL — AB (ref 65–99)
POTASSIUM: 3.6 mmol/L (ref 3.5–5.1)
Sodium: 140 mmol/L (ref 135–145)

## 2015-08-15 LAB — GLUCOSE, CAPILLARY
GLUCOSE-CAPILLARY: 112 mg/dL — AB (ref 65–99)
GLUCOSE-CAPILLARY: 145 mg/dL — AB (ref 65–99)
Glucose-Capillary: 100 mg/dL — ABNORMAL HIGH (ref 65–99)
Glucose-Capillary: 129 mg/dL — ABNORMAL HIGH (ref 65–99)
Glucose-Capillary: 133 mg/dL — ABNORMAL HIGH (ref 65–99)
Glucose-Capillary: 158 mg/dL — ABNORMAL HIGH (ref 65–99)
Glucose-Capillary: 165 mg/dL — ABNORMAL HIGH (ref 65–99)

## 2015-08-15 LAB — CBC
HEMATOCRIT: 25.2 % — AB (ref 36.0–46.0)
Hemoglobin: 7.9 g/dL — ABNORMAL LOW (ref 12.0–15.0)
MCH: 29.4 pg (ref 26.0–34.0)
MCHC: 31.3 g/dL (ref 30.0–36.0)
MCV: 93.7 fL (ref 78.0–100.0)
Platelets: 216 10*3/uL (ref 150–400)
RBC: 2.69 MIL/uL — ABNORMAL LOW (ref 3.87–5.11)
RDW: 17 % — AB (ref 11.5–15.5)
WBC: 15.4 10*3/uL — ABNORMAL HIGH (ref 4.0–10.5)

## 2015-08-15 MED ORDER — RACEPINEPHRINE HCL 2.25 % IN NEBU
INHALATION_SOLUTION | RESPIRATORY_TRACT | Status: AC
  Filled 2015-08-15: qty 0.5

## 2015-08-15 MED ORDER — RACEPINEPHRINE HCL 2.25 % IN NEBU
INHALATION_SOLUTION | RESPIRATORY_TRACT | Status: DC
Start: 2015-08-15 — End: 2015-08-15
  Filled 2015-08-15: qty 0.5

## 2015-08-15 MED ORDER — FENTANYL CITRATE (PF) 100 MCG/2ML IJ SOLN
12.5000 ug | INTRAMUSCULAR | Status: DC | PRN
  Administered 2015-08-15 (×2): 12.5 ug via INTRAVENOUS
  Administered 2015-08-16: 25 ug via INTRAVENOUS
  Filled 2015-08-15 (×4): qty 2

## 2015-08-15 NOTE — Progress Notes (Signed)
PULMONARY / CRITICAL CARE MEDICINE   Name: Lisa [name] MRN: [id number] DOB: 13-May-1927    ADMISSION DATE:  08/03/2015 CONSULTATION DATE:  11/16   REFERRING MD :  NS  CHIEF COMPLAINT:  AMS  INITIAL PRESENTATION:  81 yo WF who fell 08/03/15 and was transferred to [facility] 11/11 with SDH/SAH after fall and was accepted to NS service. No surgical interventions required. On 11/16 she developed wide complex tachycardia that required shock x 2 and amiodarone injection to break to SR. She was in obvious respiratory distress due to aspiration Emergently intubated and large mucoid debris suctioned out of her airway.   STUDIES:  Echo 11/16 - EF 15%, RVSP 65 11/13 head CT >> stable BILATERAL  frontal intracerebral hematomas,  surrounding edema is more prominent   SIGNIFICANT EVENTS: 11/11 fall with sah/sdh 11/16 Wide complex tachycardia, cardioversion x 2 11/16 intubated 11/17 runs of tachy on amio    SUBJECTIVE:   Awake and alert. Drowsy, but follows commands  On Pressure support   VITAL SIGNS: Temp:  [97.7 F (36.5 C)-99.3 F (37.4 C)] 97.8 F (36.6 C) (11/23 0800) Pulse Rate:  [84-101] 91 (11/23 1000) Resp:  [18-27] 21 (11/23 1000) BP: (90-157)/(42-82) 117/52 mmHg (11/23 1000) SpO2:  [99 %-100 %] 100 % (11/23 1000) FiO2 (%):  [40 %] 40 % (11/23 0800) Weight:  [70.3 kg (154 lb 15.7 oz)] 70.3 kg (154 lb 15.7 oz) (11/23 0500) HEMODYNAMICS:   VENTILATOR SETTINGS: Vent Mode:  [-] CPAP FiO2 (%):  [40 %] 40 % Set Rate:  [15 bmp] 15 bmp Vt Set:  [450 mL] 450 mL PEEP:  [5 cmH20] 5 cmH20 Pressure Support:  [8 cmH20] 8 cmH20 Plateau Pressure:  [13 cmH20-18 cmH20] 15 cmH20 INTAKE / OUTPUT:  Intake/Output Summary (Last 24 hours) at 08/15/15 1013 Last data filed at 08/15/15 1000  Gross per 24 hour  Intake 2011.33 ml  Output   4965 ml  Net -2953.67 ml    PHYSICAL EXAMINATION: General: Frail elderly female, on pressure support  Neuro: Following commands, moves all  extremities. Answers simple questions  PULM: LCTA bilaterally CV: RRR, no mgr GI: BS+, soft, nontender MSK: no bony deformities Derm: bruising in face over eyes, 1+ pitting edema   LABS:  CBC  Recent Labs Lab 08/11/15 0436 08/13/15 0259 08/15/15 0248  WBC 11.1* 13.6* 15.4*  HGB 8.7* 8.0* 7.9*  HCT 27.7* 24.5* 25.2*  PLT 140* 131* 216   Coag's No results for input(s): APTT, INR in the last 168 hours. BMET  Recent Labs Lab 08/13/15 1101 08/14/15 0301 08/15/15 0248  NA 138 138 140  K 3.7 3.4* 3.6  CL 110 105 104  CO2 19* 23 25  BUN 44* 49* 56*  CREATININE 1.41* 1.57* 1.58*  GLUCOSE 153* 110* 164*   Electrolytes  Recent Labs Lab 08/09/15 0337  08/10/15 0600  08/12/15 0330 08/13/15 1101 08/14/15 0301 08/15/15 0248  CALCIUM 8.2*  < > 7.6*  < > 7.4* 7.8* 8.0* 8.0*  MG 1.6*  --  1.4*  --  1.6*  --   --   --   PHOS 1.5*  --  2.1*  --  3.0  --   --   --   < > = values in this interval not displayed. Sepsis Markers No results for input(s): LATICACIDVEN, PROCALCITON, O2SATVEN in the last 168 hours. ABG  Recent Labs Lab 08/08/15 1040  PHART 7.304*  PCO2ART 26.9*  PO2ART 306*   Liver Enzymes  Recent  Labs Lab 08/10/15 0600  AST 80*  ALT 89*  ALKPHOS 68  BILITOT 0.9  ALBUMIN 1.8*   Cardiac Enzymes  Recent Labs Lab 08/08/15 1125  TROPONINI 1.10*   Glucose  Recent Labs Lab 08/14/15 1153 08/14/15 1530 08/14/15 2005 08/14/15 2359 08/15/15 0415 08/15/15 0823  GLUCAP 123* 166* 168* 145* 165* 158*    Imaging Dg Abd Portable 1v  08/14/2015  CLINICAL DATA:  Adjustment of feeding tube EXAM: PORTABLE ABDOMEN - 1 VIEW COMPARISON:  08/11/2015 FINDINGS: Feeding tube is in place with the tip in the second portion of the duodenum. Nonobstructive bowel gas pattern. IMPRESSION: Feeding tube tip in the descending duodenum. Electronically Signed   By: Charlett NoseKevin  Dover M.D.   On: 08/14/2015 11:40    DISCUSSION: Neuro status continues to improve 11/23.  Weaning from ventilator. D/c Fentanyl gtt. Hope to extubate 11/23  ASSESSMENT / PLAN:  PULMONARY OETT 11/16>> A: Aspiration pna Acute hypoxic Resp failure > failed SBT 11/22 P:   Continue SBT daily--> Hope to extubate 11/23  VAP prevention protocol   CARDIOVASCULAR A:  Wide complex tachycardia > no further episodes recently Cardiomyopathy ? Takatsubo, stress induced vs chronic, unclear Anasarca> volume overload due to cardiomyopathy> Improved  P:  Amio oral Metoprolol oral Tele  RENAL A:  AKI on CKD - baseline 1.4 P:   Continue free water per tube Avoid nephrotoxins Monitor lytes & replace as needed  GASTROINTESTINAL A:   Bright red blood per rectum last week, none 11/23 P:   PPI Continue Tube feeds Follow CBC  HEMATOLOGIC A:   Anemia  P:  F/u CBC    INFECTIOUS A:   Aspiration pneumonia P:   Sputum 11/16>> GNR >> H flu Abx:  11/16 unasyn>> 11/21  ENDOCRINE A: Hyperglycemia  P:   SSI  NEUROLOGIC A:   Subdural hemorrhage, sub arachnoid hemorrhage > improved ability to follow commands today Sedate for tube tolerance P:   RASS goal: 0 D/c Fentanyl gtt  Intermittent sedation with fentanyl   FAMILY  - Updates: Son at bedside 11/23

## 2015-08-15 NOTE — Progress Notes (Signed)
  Echocardiogram 2D Echocardiogram has been performed.  Janalyn HarderWest, Merissa Renwick R 08/15/2015, 4:52 PM

## 2015-08-15 NOTE — Evaluation (Signed)
Physical Therapy Evaluation Patient Details Name: Marita SnellenFaye K Bignell MRN: [id number] DOB: 06/19/1927 Today's Date: 08/15/2015   History of Present Illness  pt presents after a fall in a parking lot sustaining Bil Frontal Cerebral Contusions.  pt initially evaluated on 11/15 then began to have Tachycardia and respiratory decline with Encephalopathy, Pulmonary Edema, and Cardiomyopathy.  pt intubated 11/16 - 11/23.  pt with hx of HTN, Bil "Foot" fxs, Pelvic fxs, R Wrist fx, Back surgeries, and Depression.    Clinical Impression  Pt more alert and participative than previous eval.  Pt extubated this am and on 4L [place] during session with sats remaining at 99-100%.  Pt with very dry raspy cough during session.  Pt able to answer yes/no question but nodding head and followed simple directions with a few second delay.  Feel pt would be a gret candidate for CIR at D/C to maximize independence.  Will continue to follow.      Follow Up Recommendations CIR    Equipment Recommendations  None recommended by PT    Recommendations for Other Services Rehab consult     Precautions / Restrictions Precautions Precautions: Fall Restrictions Weight Bearing Restrictions: No      Mobility  Bed Mobility Overal bed mobility: Needs Assistance;+2 for physical assistance Bed Mobility: Supine to Sit;Sit to Supine     Supine to sit: Max assist;+2 for physical assistance Sit to supine: Max assist;+2 for physical assistance   General bed mobility comments: pt does participate in bed mobility, but globally weak and requiring extensive A.    Transfers                    Ambulation/Gait                Stairs            Wheelchair Mobility    Modified Rankin (Stroke Patients Only)       Balance Overall balance assessment: Needs assistance;History of Falls Sitting-balance support: Bilateral upper extremity supported;Feet supported Sitting balance-Leahy Scale: Poor Sitting balance -  Comments: pt with R lateral and posterior lean in sitting.  pt does attempt to correct balance when cued to do so, but does not independently correct.   Postural control: Posterior lean;Right lateral lean                                   Pertinent Vitals/Pain Pain Assessment: No/denies pain    Home Living Family/patient expects to be discharged to:: Inpatient rehab                      Prior Function Level of Independence: Independent         Comments: pt lives alone and does her own driving.       Hand Dominance   Dominant Hand: Left    Extremity/Trunk Assessment   Upper Extremity Assessment: Defer to OT evaluation           Lower Extremity Assessment: Generalized weakness      Cervical / Trunk Assessment: Kyphotic  Communication   Communication:  (Attempted verbalization, but very hoarse. )  Cognition Arousal/Alertness: Awake/alert Behavior During Therapy: Flat affect Overall Cognitive Status: Difficult to assess                      General Comments      Exercises  Assessment/Plan    PT Assessment Patient needs continued PT services  PT Diagnosis Difficulty walking;Generalized weakness   PT Problem List Decreased strength;Decreased activity tolerance;Decreased balance;Decreased mobility;Decreased coordination;Decreased cognition;Decreased knowledge of use of DME;Decreased safety awareness;Cardiopulmonary status limiting activity  PT Treatment Interventions DME instruction;Gait training;Functional mobility training;Therapeutic activities;Therapeutic exercise;Balance training;Neuromuscular re-education;Cognitive remediation;Patient/family education   PT Goals (Current goals can be found in the Care Plan section) Acute Rehab PT Goals Patient Stated Goal: Per son for pt to walk and talk again.   PT Goal Formulation: With family Time For Goal Achievement: 08/29/15 Potential to Achieve Goals: Good    Frequency Min  3X/week   Barriers to discharge        Co-evaluation               End of Session Equipment Utilized During Treatment: Oxygen Activity Tolerance: Patient limited by fatigue Patient left: in bed;with call bell/phone within reach;with family/visitor present Nurse Communication: Mobility status;Need for lift equipment         Time: 8469-6295 PT Time Calculation (min) (ACUTE ONLY): 19 min   Charges:   PT Evaluation $Initial PT Evaluation Tier I: 1 Procedure     PT G CodesSunny Schlein, Mathe[place] [phone] 08/15/2015, 2:10 PM

## 2015-08-15 NOTE — Progress Notes (Signed)
Upon shift assessment at 2000, pt's Cortrak feeding tube was noted to be in bed with the patient. Patient's son was at the bedside and had not noticed that she had pulled it. Contacted Dr. Delton CoombesByrum with CCM and decided to see the results of her SLP swallow evaluation in the morning before making a decision to reinsert a feeding tube. Will hold patient's PO medications for tonight.

## 2015-08-15 NOTE — Progress Notes (Signed)
Patient extubated to 4lnc. Vital signs stable. No complications. Patient is tolerating well at this time. RT will continue to monitor. RN and son at bedside.

## 2015-08-15 NOTE — Progress Notes (Signed)
MD Byrum notified of pt resp status. Minimal stridor noted with auscultation, not noticeable audibly. Inquired about racemic epi. HR ST 103, SBP 130-140s, scheduled Lopressor 2.5mg  given. RR 15-20, Pt in no acute distress. Will continue secretion management and will not administer racemic epi. PRN NTS ordered, not needed at this time. Decorey Wahlert L

## 2015-08-16 DIAGNOSIS — L89622 Pressure ulcer of left heel, stage 2: Secondary | ICD-10-CM | POA: Diagnosis present

## 2015-08-16 DIAGNOSIS — I5021 Acute systolic (congestive) heart failure: Secondary | ICD-10-CM

## 2015-08-16 LAB — GLUCOSE, CAPILLARY
GLUCOSE-CAPILLARY: 114 mg/dL — AB (ref 65–99)
GLUCOSE-CAPILLARY: 117 mg/dL — AB (ref 65–99)
GLUCOSE-CAPILLARY: 119 mg/dL — AB (ref 65–99)
Glucose-Capillary: 109 mg/dL — ABNORMAL HIGH (ref 65–99)
Glucose-Capillary: 110 mg/dL — ABNORMAL HIGH (ref 65–99)

## 2015-08-16 LAB — BASIC METABOLIC PANEL
Anion gap: 9 (ref 5–15)
BUN: 51 mg/dL — AB (ref 6–20)
CHLORIDE: 107 mmol/L (ref 101–111)
CO2: 26 mmol/L (ref 22–32)
Calcium: 8.5 mg/dL — ABNORMAL LOW (ref 8.9–10.3)
Creatinine, Ser: 1.39 mg/dL — ABNORMAL HIGH (ref 0.44–1.00)
GFR calc Af Amer: 39 mL/min — ABNORMAL LOW (ref >60)
GFR calc non Af Amer: 33 mL/min — ABNORMAL LOW (ref >60)
Glucose, Bld: 120 mg/dL — ABNORMAL HIGH (ref 65–99)
POTASSIUM: 3.8 mmol/L (ref 3.5–5.1)
SODIUM: 142 mmol/L (ref 135–145)

## 2015-08-16 LAB — CBC
HCT: 25.1 % — ABNORMAL LOW (ref 36.0–46.0)
HEMOGLOBIN: 7.8 g/dL — AB (ref 12.0–15.0)
MCH: 29.3 pg (ref 26.0–34.0)
MCHC: 31.1 g/dL (ref 30.0–36.0)
MCV: 94.4 fL (ref 78.0–100.0)
Platelets: 275 10*3/uL (ref 150–400)
RBC: 2.66 MIL/uL — AB (ref 3.87–5.11)
RDW: 17.5 % — ABNORMAL HIGH (ref 11.5–15.5)
WBC: 16.9 10*3/uL — ABNORMAL HIGH (ref 4.0–10.5)

## 2015-08-16 MED ORDER — AMIODARONE HCL IN DEXTROSE 360-4.14 MG/200ML-% IV SOLN
30.0000 mg/h | INTRAVENOUS | Status: DC
  Administered 2015-08-17 (×2): 30 mg/h via INTRAVENOUS
  Filled 2015-08-16 (×3): qty 200

## 2015-08-16 MED ORDER — AMIODARONE HCL IN DEXTROSE 360-4.14 MG/200ML-% IV SOLN
60.0000 mg/h | INTRAVENOUS | Status: DC
  Administered 2015-08-17 (×3): 60 mg/h via INTRAVENOUS
  Filled 2015-08-16 (×2): qty 200

## 2015-08-16 MED ORDER — AMIODARONE LOAD VIA INFUSION
150.0000 mg | Freq: Once | INTRAVENOUS | Status: AC
  Administered 2015-08-17: 150 mg via INTRAVENOUS
  Filled 2015-08-16: qty 83.34

## 2015-08-16 MED ORDER — AMIODARONE HCL 200 MG PO TABS
200.0000 mg | ORAL_TABLET | Freq: Every day | ORAL | Status: DC
  Administered 2015-08-17 – 2015-08-21 (×5): 200 mg via ORAL
  Filled 2015-08-16 (×7): qty 1

## 2015-08-16 MED ORDER — PANTOPRAZOLE SODIUM 40 MG IV SOLR
40.0000 mg | INTRAVENOUS | Status: DC
  Administered 2015-08-16 – 2015-08-17 (×2): 40 mg via INTRAVENOUS
  Filled 2015-08-16 (×2): qty 40

## 2015-08-16 NOTE — Evaluation (Signed)
Clinical/Bedside Swallow Evaluation Patient Details  Name: Lisa SnellenFaye K Gault MRN: [id number] Date of Birth: 01/28/1927  Today's Date: 08/16/2015 Time: SLP Start Time (ACUTE ONLY): 0913 SLP Stop Time (ACUTE ONLY): 0929 SLP Time Calculation (min) (ACUTE ONLY): 16 min  Past Medical History:  Past Medical History  Diagnosis Date  . Allergy   . Pelvis fracture (HCC)   . Wrist fracture, right   . Depression   . Spinal stenosis   . Osteoarthritis   . Hypertension   . Foot fracture     bilateral   Past Surgical History:  Past Surgical History  Procedure Laterality Date  . Back surgery N/A   . Breast lumpectomy N/A    HPI:  81 year old female presents after a fall in a parking lot sustaining Bil Frontal Cerebral Contusions.Pt was initially evaluated 11/16 with recommendation for NPO, but then began to have tachycardia and respiratory decline with encephalopathy, pulmonary edema, and cardiomyopathy. she was intubated 11/16-11/23.  pt with hx of HTN, Bil "Foot" fxs, Pelvic fxs, R Wrist fx, Back surgeries, and Depression.    Assessment / Plan / Recommendation Clinical Impression  Pt is lethargic, requiring frequent stimulation for arousal and acceptance of POs. Ice chips are orally held with poor awareness, as melting liquid likely trickles into her pharynx. Cough response is weak. Recommend to continue NPO. Discussed with son that prognosis for diet advancement is good pending increased time post-extubation and increased arousal. Will continue to follow for readiness.    Aspiration Risk  Severe aspiration risk    Diet Recommendation  NPO   Medication Administration: Via alternative means    Other  Recommendations Oral Care Recommendations: Oral care QID   Follow up Recommendations  Inpatient Rehab    Frequency and Duration min 2x/week  2 weeks       Prognosis Prognosis for Safe Diet Advancement: Good Barriers to Reach Goals: Cognitive deficits      Swallow Study   General  HPI: 81 year old female presents after a fall in a parking lot sustaining Bil Frontal Cerebral Contusions.Pt was initially evaluated 11/16 with recommendation for NPO, but then began to have tachycardia and respiratory decline with encephalopathy, pulmonary edema, and cardiomyopathy. she was intubated 11/16-11/23.  pt with hx of HTN, Bil "Foot" fxs, Pelvic fxs, R Wrist fx, Back surgeries, and Depression.  Type of Study: Bedside Swallow Evaluation Previous Swallow Assessment: see HPI Diet Prior to this Study: NPO Temperature Spikes Noted: No Respiratory Status: Nasal cannula History of Recent Intubation: Yes Length of Intubations (days): 8 days Date extubated: 08/15/15 Behavior/Cognition: Lethargic/Drowsy Oral Cavity Assessment: Dry;Lesions Oral Care Completed by SLP: Yes Oral Cavity - Dentition: Adequate natural dentition Self-Feeding Abilities: Total assist Patient Positioning: Upright in bed Baseline Vocal Quality: Not observed Volitional Cough: Cognitively unable to elicit Volitional Swallow: Unable to elicit    Oral/Motor/Sensory Function Overall Oral Motor/Sensory Function: Generalized oral weakness (unable to formally assess)   Ice Chips Ice chips: Impaired Presentation: Spoon Oral Phase Impairments: Poor awareness of bolus Oral Phase Functional Implications: Oral holding Pharyngeal Phase Impairments: Suspected delayed Swallow;Cough - Immediate   Thin Liquid Thin Liquid: Not tested    Nectar Thick Nectar Thick Liquid: Not tested   Honey Thick Honey Thick Liquid: Not tested   Puree Puree: Not tested   Solid Solid: Not tested      Maxcine HamLaura Paiewonsky, M.A. CCC-SLP [phone]  Maxcine Hamaiewonsky, Camille Dragan 08/16/2015,9:40 AM

## 2015-08-16 NOTE — Progress Notes (Signed)
PULMONARY / CRITICAL CARE MEDICINE   Name: Lisa [name] MRN: [id number] DOB: 09/15/1927    ADMISSION DATE:  08/03/2015 CONSULTATION DATE:  11/16   REFERRING MD :  NS  CHIEF COMPLAINT:  AMS  INITIAL PRESENTATION:  81 yo WF who fell 08/03/15 and was [facility]/11 with SDH/SAH after fall and was accepted to NS service. No surgical interventions required. On 11/16 she developed wide complex tachycardia that required shock x 2 and amiodarone injection to break to SR. She was in obvious respiratory distress due to aspiration Emergently intubated and large mucoid debris suctioned out of her airway.  STUDIES:  11/13 head CT >> stable BILATERAL  frontal intracerebral hematomas,  surrounding edema is more prominent  11/16 Echo >>  EF 15%, RVSP 65 11/23 Echo >> EF 25%  SIGNIFICANT EVENTS: 11/11 fall with sah/sdh 11/16 Wide complex tachycardia, cardioversion x 2 11/16 intubated 11/17 runs of tachy on amio   SUBJECTIVE:   [place].  VITAL SIGNS: Temp:  [97.6 F (36.4 C)-99 F (37.2 C)] 99 F (37.2 C) (11/24 0300) Pulse Rate:  [84-104] 97 (11/24 0700) Resp:  [12-28] 16 (11/24 0700) BP: (96-145)/(44-69) 142/57 mmHg (11/24 0700) SpO2:  [97 %-100 %] 97 % (11/24 0700) FiO2 (%):  [40 %] 40 % (11/23 0800) Weight:  [152 lb 12.5 oz (69.3 kg)] 152 lb 12.5 oz (69.3 kg) (11/24 0400) INTAKE / OUTPUT:  Intake/Output Summary (Last 24 hours) at 08/16/15 0729 Last data filed at 08/16/15 0700  Gross per 24 hour  Intake   1095 ml  Output   1575 ml  Net   -480 ml    PHYSICAL EXAMINATION: General: Frail elderly female Neuro: Following commands, moves all extremities PULM: LCTA bilaterally CV: RRR, no mgr GI: BS+, soft, nontender MSK: no bony deformities Derm: bruising in face over eyes, 1+ pitting edema   LABS:  CBC  Recent Labs Lab 08/13/15 0259 08/15/15 0248 08/16/15 0322  WBC 13.6* 15.4* 16.9*  HGB 8.0* 7.9* 7.8*  HCT 24.5* 25.2* 25.1*  PLT 131* 216 275    BMET  Recent Labs Lab 08/14/15 0301 08/15/15 0248 08/16/15 0322  NA 138 140 142  K 3.4* 3.6 3.8  CL 105 104 107  CO2 23 25 26   BUN 49* 56* 51*  CREATININE 1.57* 1.58* 1.39*  GLUCOSE 110* 164* 120*   Electrolytes  Recent Labs Lab 08/10/15 0600  08/12/15 0330  08/14/15 0301 08/15/15 0248 08/16/15 0322  CALCIUM 7.6*  < > 7.4*  < > 8.0* 8.0* 8.5*  MG 1.4*  --  1.6*  --   --   --   --   PHOS 2.1*  --  3.0  --   --   --   --   < > = values in this interval not displayed.  Liver Enzymes  Recent Labs Lab 08/10/15 0600  AST 80*  ALT 89*  ALKPHOS 68  BILITOT 0.9  ALBUMIN 1.8*   Glucose  Recent Labs Lab 08/15/15 0823 08/15/15 1209 08/15/15 1524 08/15/15 1958 08/15/15 2338 08/16/15 0459  GLUCAP 158* 129* 112* 133* 100* 114*    Imaging No results found.   ASSESSMENT / PLAN:  PULMONARY ETT 11/16>> 11/23 A: Acute hypoxic respiratory failure 2nd to Aspiration pna. P:   Monitor oxygenation  CARDIOVASCULAR A:  Wide complex tachycardia > no further episodes recently. Cardiomyopathy ? Takatsubo, stress induced vs chronic, unclear. P:  Amiodarone, lopressor Cardiology sign off 11/19  RENAL A:  AKI on  CKD - baseline 1.4. P:   Monitor renal fx, urine outpt.  GASTROINTESTINAL A:   Bright red blood per rectum >> resolved. Dysphagia. P:   PPI F/u with Speech therapy  HEMATOLOGIC A:   Anemia. P:  F/u CBC  Check iron studies SCDs for DVT prevention  INFECTIOUS A:   Pneumonia with Haemophilus influenzae in sputum >> completed 5 days of Abx on 11/21. P:   Monitor off Abx  ENDOCRINE A: Hyperglycemia  P:   SSI  NEUROLOGIC A:   Subdural hemorrhage, sub arachnoid hemorrhage > neuro function slowly improving. P:   Per neurosurgery PT/OT  Stable for transfer out of ICU from PCCM standpoint.  Recommend consulting [facility] for further medical management once pt transferred out of ICU.  Coralyn Helling, MD [facility] Pulmonary/Critical  Care 08/16/2015, 7:36 AM Pager:  [phone] After 3pm call: [phone]

## 2015-08-16 NOTE — Progress Notes (Signed)
Pt EKG monitor showing VT/AFIB/ST with PVCs and HR 130-140s BP stable and Mental status unchanged. Increased HR started about 2206. Minimal response fm PRN 5mg  IV Lopressor. Elink PCCM MD called orders for amiodarone IV to restart.

## 2015-08-16 NOTE — Progress Notes (Signed)
Patient ID: Lisa SnellenFaye K Beckum, female   DOB: 08/09/1927, 81 y.o.   MRN: [id number] Subjective: Patient looks pretty good at present. She self extubated. She has no respiratory distress. She is complaining of no pain.  Objective: Vital signs in last 24 hours: Temp:  [97.6 F (36.4 C)-99 F (37.2 C)] 97.6 F (36.4 C) (11/24 0802) Pulse Rate:  [89-104] 100 (11/24 0800) Resp:  [12-28] 16 (11/24 0800) BP: (117-145)/(46-69) 132/67 mmHg (11/24 0800) SpO2:  [97 %-100 %] 98 % (11/24 0800) Weight:  [69.3 kg (152 lb 12.5 oz)] 69.3 kg (152 lb 12.5 oz) (11/24 0400)  Intake/Output from previous day: 11/23 0701 - 11/24 0700 In: 1095 [I.V.:255; NG/GT:840] Out: 1575 [Urine:1575] Intake/Output this shift: Total I/O In: 10 [I.V.:10] Out: 150 [Urine:150]  She is awake and her eyes are open and she follows commands. Nonlabored respirations.  Lab Results: Lab Results  Component Value Date   WBC 16.9* 08/16/2015   HGB 7.8* 08/16/2015   HCT 25.1* 08/16/2015   MCV 94.4 08/16/2015   PLT 275 08/16/2015   Lab Results  Component Value Date   INR 1.20 08/03/2015   BMET Lab Results  Component Value Date   NA 142 08/16/2015   K 3.8 08/16/2015   CL 107 08/16/2015   CO2 26 08/16/2015   GLUCOSE 120* 08/16/2015   BUN 51* 08/16/2015   CREATININE 1.39* 08/16/2015   CALCIUM 8.5* 08/16/2015    Studies/Results: Dg Chest Port 1 View  08/14/2015  CLINICAL DATA:  Respiratory failure EXAM: PORTABLE CHEST 1 VIEW COMPARISON:  08/12/2015 FINDINGS: Stable endotracheal tube. Feeding tube tip projects over the midesophagus. Left basilar airspace disease is stable. Right lung is relatively keel ear. No pneumothorax. Normal heart size. IMPRESSION: Stable left basilar airspace disease Feeding tube retracted.  Tip now projects over the mid esophagus. Electronically Signed   By: Jolaine ClickArthur  Hoss M.D.   On: 08/14/2015 10:29   Dg Abd Portable 1v  08/14/2015  CLINICAL DATA:  Adjustment of feeding tube EXAM: PORTABLE ABDOMEN -  1 VIEW COMPARISON:  08/11/2015 FINDINGS: Feeding tube is in place with the tip in the second portion of the duodenum. Nonobstructive bowel gas pattern. IMPRESSION: Feeding tube tip in the descending duodenum. Electronically Signed   By: Charlett NoseKevin  Dover M.D.   On: 08/14/2015 11:40    Assessment/Plan: Stable at present. Continue current management. Try to mobilize as tolerated. Hopefully she will maintain her respiratory status. Her cough is weak.   LOS: 13 days    Jaxon Mynhier S 08/16/2015, 8:57 AM

## 2015-08-17 ENCOUNTER — Inpatient Hospital Stay (HOSPITAL_COMMUNITY): Payer: Medicare Other

## 2015-08-17 LAB — BASIC METABOLIC PANEL
ANION GAP: 9 (ref 5–15)
BUN: 45 mg/dL — ABNORMAL HIGH (ref 6–20)
CALCIUM: 8.6 mg/dL — AB (ref 8.9–10.3)
CHLORIDE: 111 mmol/L (ref 101–111)
CO2: 23 mmol/L (ref 22–32)
Creatinine, Ser: 1.35 mg/dL — ABNORMAL HIGH (ref 0.44–1.00)
GFR calc non Af Amer: 34 mL/min — ABNORMAL LOW (ref >60)
GFR, EST AFRICAN AMERICAN: 40 mL/min — AB (ref >60)
Glucose, Bld: 131 mg/dL — ABNORMAL HIGH (ref 65–99)
Potassium: 3.4 mmol/L — ABNORMAL LOW (ref 3.5–5.1)
Sodium: 143 mmol/L (ref 135–145)

## 2015-08-17 LAB — GLUCOSE, CAPILLARY
GLUCOSE-CAPILLARY: 128 mg/dL — AB (ref 65–99)
GLUCOSE-CAPILLARY: 163 mg/dL — AB (ref 65–99)
GLUCOSE-CAPILLARY: 167 mg/dL — AB (ref 65–99)
GLUCOSE-CAPILLARY: 163 mg/dL — AB (ref 65–99)
Glucose-Capillary: 114 mg/dL — ABNORMAL HIGH (ref 65–99)
Glucose-Capillary: 118 mg/dL — ABNORMAL HIGH (ref 65–99)

## 2015-08-17 LAB — IRON AND TIBC
Iron: 18 ug/dL — ABNORMAL LOW (ref 28–170)
SATURATION RATIOS: 7 % — AB (ref 10.4–31.8)
TIBC: 253 ug/dL (ref 250–450)
UIBC: 235 ug/dL

## 2015-08-17 LAB — CBC
HEMATOCRIT: 26.6 % — AB (ref 36.0–46.0)
HEMOGLOBIN: 8.2 g/dL — AB (ref 12.0–15.0)
MCH: 29.4 pg (ref 26.0–34.0)
MCHC: 30.8 g/dL (ref 30.0–36.0)
MCV: 95.3 fL (ref 78.0–100.0)
Platelets: 329 10*3/uL (ref 150–400)
RBC: 2.79 MIL/uL — ABNORMAL LOW (ref 3.87–5.11)
RDW: 18 % — ABNORMAL HIGH (ref 11.5–15.5)
WBC: 15.4 10*3/uL — AB (ref 4.0–10.5)

## 2015-08-17 LAB — FERRITIN: FERRITIN: 266 ng/mL (ref 11–307)

## 2015-08-17 MED ORDER — POTASSIUM CHLORIDE CRYS ER 20 MEQ PO TBCR
40.0000 meq | EXTENDED_RELEASE_TABLET | Freq: Once | ORAL | Status: AC
  Administered 2015-08-17: 40 meq via ORAL
  Filled 2015-08-17: qty 2

## 2015-08-17 MED ORDER — METOPROLOL TARTRATE 12.5 MG HALF TABLET
12.5000 mg | ORAL_TABLET | Freq: Two times a day (BID) | ORAL | Status: DC
Start: 2015-08-17 — End: 2015-08-22
  Administered 2015-08-17 – 2015-08-22 (×8): 12.5 mg via ORAL
  Filled 2015-08-17 (×13): qty 1

## 2015-08-17 MED ORDER — WHITE PETROLATUM GEL
Status: AC
  Administered 2015-08-17: 0.2
  Filled 2015-08-17: qty 1

## 2015-08-17 MED ORDER — RESOURCE THICKENUP CLEAR PO POWD
Freq: Once | ORAL | Status: DC
  Filled 2015-08-17 (×2): qty 125

## 2015-08-17 NOTE — Progress Notes (Signed)
Occupational Therapy Evaluation Patient Details Name: Lisa [name] MRN: [id number] DOB: 01/07/1927 Today's Date: 11-17-2014    History of Present Illness pt presents after a fall in a parking lot sustaining Bil Frontal Cerebral Contusions.  pt initially evaluated on 11/15 then began to have Tachycardia and respiratory decline with Encephalopathy, Pulmonary Edema, and Cardiomyopathy.  pt intubated 11/16 - 11/23.  pt with hx of HTN, Bil "Foot" fxs, Pelvic fxs, R Wrist fx, Back surgeries, and Depression.     Clinical Impression   PTA, pt independent with ADL and mobility and lived alone. Pt presents with deficits listed below and will benefit from skilled OT services to maximize functional level of independence. At this time, recommend CIR for D/C. Very supportive family that states they can arrange 24/7 S after D/C. Will follow acutely to address established goals.     Follow Up Recommendations  CIR;Supervision/Assistance - 24 hour    Equipment Recommendations  3 in 1 bedside comode    Recommendations for Other Services Rehab consult     Precautions / Restrictions Precautions Precautions: Fall Restrictions Weight Bearing Restrictions: No      Mobility Bed Mobility Overal bed mobility: Needs Assistance;+2 for physical assistance Bed Mobility: Supine to Sit     Supine to sit: Max assist;+2 for physical assistance     General bed mobility comments: pt needs cues and facilitation for bed mobility.  After a delay pt is able to participate in coming to sitting.    Transfers Overall transfer level: Needs assistance Equipment used: 2 person hand held assist   Sit to Stand: Max assist;+2 physical assistance Stand pivot transfers: Total assist;+2 physical assistance       General transfer comment: Repeated coming to stand x2 utilizing pad under hips to A with trunk/hips extension.  pt remains flexed posture and needs Bil feet blocked, but does participate with coming to stand.   Total A to complete pivot.      Balance     Sitting balance-Leahy Scale: Poor Sitting balance - Comments: pt leans posteriorly and to R side and does attempt to correct balance when cued.       Standing balance-Leahy Scale: Zero                              ADL Overall ADL's : Needs assistance/impaired Eating/Feeding: NPO   Grooming: Moderate assistance Grooming Details (indicate cue type and reason): Able to assist with oral care using R nondominant hand Upper Body Bathing: Maximal assistance   Lower Body Bathing: Maximal assistance   Upper Body Dressing : Maximal assistance   Lower Body Dressing: Total assistance   Toilet Transfer: +2 for physical assistance;Maximal assistance Toilet Transfer Details (indicate cue type and reason): simulated Toileting- Clothing Manipulation and Hygiene: Total assistance       Functional mobility during ADLs: Maximal assistance;+2 for physical assistance       Vision Additional Comments: will further assess   Perception     Praxis Praxis Praxis tested?: Deficits Deficits: Initiation Praxis-Other Comments: delay in response    Pertinent Vitals/Pain Pain Assessment: Faces Faces Pain Scale: Hurts little more Pain Location: son states pt has hx of back pain Pain Descriptors / Indicators: Grimacing Pain Intervention(s): Limited activity within patient's tolerance;Monitored during session     Hand Dominance Left   Extremity/Trunk Assessment Upper Extremity Assessment Upper Extremity Assessment: Generalized weakness;Difficult to assess due to impaired cognition (appears to be preferring to  use R hand)   Lower Extremity Assessment Lower Extremity Assessment: Defer to PT evaluation   Cervical / Trunk Assessment Cervical / Trunk Assessment: Kyphotic   Communication Communication Communication: Expressive difficulties   Cognition Arousal/Alertness: Awake/alert Behavior During Therapy: Flat affect Overall  Cognitive Status: Impaired/Different from baseline Area of Impairment: Orientation;Attention;Memory;Following commands;Safety/judgement;Awareness;Problem solving Orientation Level:  (able to state she was in the hospital) Current Attention Level: Sustained   Following Commands: Follows one step commands with increased time Safety/Judgement: Decreased awareness of deficits Awareness: Intellectual Problem Solving: Slow processing;Decreased initiation;Difficulty sequencing;Requires verbal cues;Requires tactile cues General Comments: Difficult to assess cognition due to minimal verbalizations, but pt does nod head yes/no appropriately and follows simple directions with delay.  pt more alert today than previous session, but remains very flat.     General Comments   son present. Sates pt has a history of ?L leg weakness from back issue.     Exercises       Shoulder Instructions      Home Living Family/patient expects to be discharged to:: Inpatient rehab                                        Prior Functioning/Environment Level of Independence: Independent        Comments: pt lives alone and does her own driving.      OT Diagnosis: Generalized weakness;Cognitive deficits   OT Problem List: Decreased strength;Decreased range of motion;Decreased activity tolerance;Impaired balance (sitting and/or standing);Decreased coordination;Decreased cognition;Decreased safety awareness;Decreased knowledge of use of DME or AE;Decreased knowledge of precautions;Cardiopulmonary status limiting activity;Impaired UE functional use   OT Treatment/Interventions: Self-care/ADL training;Therapeutic exercise;DME and/or AE instruction;Therapeutic activities;Cognitive remediation/compensation;Patient/family education;Balance training    OT Goals(Current goals can be found in the care plan section) Acute Rehab OT Goals Patient Stated Goal: Per son for pt to walk and talk again.   OT Goal  Formulation: With patient/family Time For Goal Achievement: 08/31/15 Potential to Achieve Goals: Good ADL Goals Pt Will Perform Eating: with set-up;with supervision;sitting Pt Will Perform Grooming: with set-up;with supervision;sitting Pt Will Perform Upper Body Bathing: with set-up;with supervision;sitting Pt Will Transfer to Toilet: with mod assist;bedside commode Additional ADL Goal #1: Maintain sitting balance EOB with S x 10 min with S in preparation for ADL taks  OT Frequency: Min 2X/week   Barriers to D/C:            Co-evaluation PT/OT/SLP Co-Evaluation/Treatment: Yes Reason for Co-Treatment: Complexity of the patient's impairments (multi-system involvement);Necessary to address cognition/behavior during functional activity;For patient/therapist safety   OT goals addressed during session: ADL's and self-care;Strengthening/ROM      End of Session Equipment Utilized During Treatment: Gait belt Nurse Communication: Mobility status;Precautions  Activity Tolerance: Patient tolerated treatment well Patient left: in chair;with call bell/phone within reach;with family/visitor present   Time: 5409-8119 OT Time Calculation (min): 30 min Charges:  OT General Charges $OT Visit: 1 Procedure OT Evaluation $Initial OT Evaluation Tier I: 1 Procedure G-Codes:    Lavaun Greenfield,HILLARY 09-04-15, 2:27 PM   [facility], OTR/L  [phone] 09/04/15

## 2015-08-17 NOTE — Progress Notes (Signed)
PULMONARY / CRITICAL CARE MEDICINE   Name: Lisa [name] MRN: [id number] DOB: [birth year]    ADMISSION DATE:  08/03/2015 CONSULTATION DATE:  11/16   REFERRING MD :  NS  CHIEF COMPLAINT:  AMS  INITIAL PRESENTATION:  81 yo WF who fell 08/03/15 and was transferred to [facility] 11/11 with SDH/SAH after fall and was accepted to NS service. No surgical interventions required. On 11/16 she developed wide complex tachycardia that required shock x 2 and amiodarone injection to break to SR. She was in obvious respiratory distress due to aspiration Emergently intubated and large mucoid debris suctioned out of her airway.  STUDIES:  11/13 head CT >> stable BILATERAL  frontal intracerebral hematomas,  surrounding edema is more prominent  11/16 Echo >>  EF 15%, RVSP 65 11/23 Echo >> EF 25%  SIGNIFICANT EVENTS: 11/11 fall with sah/sdh 11/16 Wide complex tachycardia, cardioversion x 2 11/16 intubated 11/17 runs of tachy on amio 11/23 extubated 11/25 passed modified barium swallow test   SUBJECTIVE:   Speech soft, coherent Sat up in chair this morning Passed Swallow eval  VITAL SIGNS: Temp:  [97.2 F (36.2 C)-98.4 F (36.9 C)] 97.7 F (36.5 C) (11/25 0800) Pulse Rate:  [84-148] 88 (11/25 0600) Resp:  [19-33] 32 (11/25 0600) BP: (130-160)/(44-72) 144/49 mmHg (11/25 0800) SpO2:  [97 %-100 %] 98 % (11/25 0600) Weight:  [144 lb 10 oz (65.6 kg)] 144 lb 10 oz (65.6 kg) (11/25 0352) INTAKE / OUTPUT:  Intake/Output Summary (Last 24 hours) at 08/17/15 1201 Last data filed at 08/17/15 1000  Gross per 24 hour  Intake 1154.3 ml  Output    750 ml  Net  404.3 ml    PHYSICAL EXAMINATION: General: Frail elderly female in no distress in bed Neuro: Following commands, moves all extremities PULM: CTA B CV: RRR, no mgr GI: BS+, soft, nontender MSK: no bony deformities Derm: bruising in face over eyes, 1+ pitting edema   LABS:  CBC  Recent Labs Lab 08/15/15 0248 08/16/15 0322  08/17/15 0425  WBC 15.4* 16.9* 15.4*  HGB 7.9* 7.8* 8.2*  HCT 25.2* 25.1* 26.6*  PLT 216 275 329   BMET  Recent Labs Lab 08/15/15 0248 08/16/15 0322 08/17/15 0425  NA 140 142 143  K 3.6 3.8 3.4*  CL 104 107 111  CO2 BUN 56* 51* 45*  CREATININE 1.58* 1.39* 1.35*  GLUCOSE 164* 120* 131*   Electrolytes  Recent Labs Lab 08/12/15 0330  08/15/15 0248 08/16/15 0322 08/17/15 0425  CALCIUM 7.4*  < > 8.0* 8.5* 8.6*  MG 1.6*  --   --   --   --   PHOS 3.0  --   --   --   --   < > = values in this interval not displayed.  Liver Enzymes No results for input(s): AST, ALT, ALKPHOS, BILITOT, ALBUMIN in the last 168 hours. Glucose  Recent Labs Lab 08/16/15 1148 08/16/15 1542 08/16/15 2014 08/16/15 2353 08/17/15 0403 08/17/15 0747  GLUCAP 119* 114* 109* 110* 128* 118*    Imaging Dg Swallowing Func-speech Pathology  01-08-202016  Objective Swallowing Evaluation:   Patient Details Name: Lisa [name] MRN: [id number] Date of Birth: 1927/03/21 Today's Date: 01-08-202016 Time: SLP Start Time (ACUTE ONLY): 1106-SLP Stop Time (ACUTE ONLY): 1134 SLP Time Calculation (min) (ACUTE ONLY): 28 min Past Medical History: Past Medical History Diagnosis Date . Allergy  . Pelvis fracture (HCC)  . Wrist fracture, right  . Depression  .  Spinal stenosis  . Osteoarthritis  . Hypertension  . Foot fracture    bilateral Past Surgical History: Past Surgical History Procedure Laterality Date . Back surgery N/A  . Breast lumpectomy N/A  HPI: 81 year old female presents after a fall in a parking lot sustaining Bil Frontal Cerebral Contusions.Pt was initially evaluated 11/16 with recommendation for NPO, but then began to have tachycardia and respiratory decline with encephalopathy, pulmonary edema, and cardiomyopathy. she was intubated 11/16-11/23.  pt with hx of HTN, Bil "Foot" fxs, Pelvic fxs, R Wrist fx, Back surgeries, and Depression.  Subjective: pt sleepy Assessment / Plan / Recommendation CHL IP  CLINICAL IMPRESSIONS 09/20/202016 Therapy Diagnosis Moderate oral phase dysphagia;Mild pharyngeal phase dysphagia Clinical Impression Pt presents with a mild-moderate oropharyngeal dysphagia marked by: 1) decreased activity of lingual musculature, oral pumping, leading to piecemeal bolusing; 2) adequate clearance of materials through pharynx into UES; 3) silent aspiration of nectar-thick liquids due to sensory impairments s/p prolonged intubation.  Pt may begin a dysphagia 1 diet, honey-thick liquids, which poses the least risk.  Meds should be crushed in puree.  Pt will require full supervision for verbal cueing for safety.  Son was present for study; results/recs reviewed; pt and son agree.  Impact on safety and function Moderate aspiration risk   CHL IP TREATMENT RECOMMENDATION 09/20/202016 Treatment Recommendations Therapy as outlined in treatment plan below   Prognosis 09/20/202016 Prognosis for Safe Diet Advancement Good Barriers to Reach Goals Cognitive deficits Barriers/Prognosis Comment -- CHL IP DIET RECOMMENDATION 09/20/202016 SLP Diet Recommendations Dysphagia 1 (Puree) solids;Honey thick liquids Liquid Administration via Cup Medication Administration Crushed with puree Compensations Small sips/bites;Effortful swallow Postural Changes Seated upright at 90 degrees   CHL IP OTHER RECOMMENDATIONS 09/20/202016 Recommended Consults -- Oral Care Recommendations Oral care BID Other Recommendations --   CHL IP FOLLOW UP RECOMMENDATIONS 09/20/202016 Follow up Recommendations Inpatient Rehab   CHL IP FREQUENCY AND DURATION 09/20/202016 Speech Therapy Frequency (ACUTE ONLY) min 2x/week Treatment Duration 2 weeks      CHL IP ORAL PHASE 09/20/202016 Oral Phase Impaired Oral - Pudding Teaspoon -- Oral - Pudding Cup -- Oral - Honey Teaspoon -- Oral - Honey Cup Lingual pumping;Reduced posterior propulsion;Piecemeal swallowing;Weak lingual manipulation Oral - Nectar Teaspoon Lingual pumping;Reduced posterior propulsion;Piecemeal  swallowing;Weak lingual manipulation Oral - Nectar Cup -- Oral - Nectar Straw -- Oral - Thin Teaspoon -- Oral - Thin Cup -- Oral - Thin Straw -- Oral - Puree Lingual pumping;Reduced posterior propulsion;Piecemeal swallowing;Weak lingual manipulation Oral - Mech Soft -- Oral - Regular -- Oral - Multi-Consistency -- Oral - Pill -- Oral Phase - Comment --  CHL IP PHARYNGEAL PHASE 09/20/202016 Pharyngeal Phase Impaired Pharyngeal- Pudding Teaspoon -- Pharyngeal -- Pharyngeal- Pudding Cup -- Pharyngeal -- Pharyngeal- Honey Teaspoon -- Pharyngeal -- Pharyngeal- Honey Cup Delayed swallow initiation-vallecula Pharyngeal -- Pharyngeal- Nectar Teaspoon Delayed swallow initiation-pyriform sinuses;Trace aspiration Pharyngeal -- Pharyngeal- Nectar Cup -- Pharyngeal -- Pharyngeal- Nectar Straw -- Pharyngeal -- Pharyngeal- Thin Teaspoon -- Pharyngeal -- Pharyngeal- Thin Cup -- Pharyngeal -- Pharyngeal- Thin Straw -- Pharyngeal -- Pharyngeal- Puree Delayed swallow initiation-vallecula Pharyngeal -- Pharyngeal- Mechanical Soft -- Pharyngeal -- Pharyngeal- Regular -- Pharyngeal -- Pharyngeal- Multi-consistency -- Pharyngeal -- Pharyngeal- Pill -- Pharyngeal -- Pharyngeal Comment silent aspiration nectars  No flowsheet data found. Amanda L. Samson Frederic, [place] CCC/SLP Pager [phone] Blenda Mounts Laurice 09/20/202016, 11:49 AM                ASSESSMENT / PLAN:  PULMONARY ETT 11/16>> 11/23 A: Acute  hypoxic respiratory failure 2nd to Aspiration pna > resolved P:   Monitor oxygenation Aspiration precautions Monitor O2 saturation  CARDIOVASCULAR A:  Wide complex tachycardia > no further episodes recently Cardiomyopathy ? Takatsubo, stress induced vs chronic, unclear > improved on 11/23 Echo P:  Amiodarone, lopressor > change to oral Cardiology sign off 11/19, will need follow up with them if she is discharged  RENAL A:  AKI on CKD - baseline 1.4 Hypokalemia P:   Monitor renal fx, urine outpt Replace  K  GASTROINTESTINAL A:   Bright red blood per rectum >> resolved Dysphagia P:   PPI F/u with Speech therapy  HEMATOLOGIC A:   Anemia P:  F/u CBC  Check iron studies SCDs for DVT prevention  INFECTIOUS A:   Pneumonia with Haemophilus influenzae in sputum >> completed 5 days of Abx on 11/21 P:   Monitor off Abx  ENDOCRINE A: Hyperglycemia  P:   SSI  NEUROLOGIC A:   Subdural hemorrhage, sub arachnoid hemorrhage > neuro function slowly improving P:   Per neurosurgery PT/OT  Stable for transfer out of ICU from PCCM standpoint.  Recommend consulting [facility] for further medical management once pt transferred out of ICU.  PCCM to sign off  Heber CarolinaBrent Eliannah Hinde, MD  PCCM Pager: [phone] Cell: [phone] After 3pm or if no response, call [phone]

## 2015-08-17 NOTE — Progress Notes (Signed)
Patient ID: Lisa [name], female   DOB: 06/20/1927, 81 y.o.   MRN: [id number] Patient looks very comfortable. She has no respiratory distress. She is awake and following commands. Denies headache. Has yet to be mobilized. We'll work on that today.

## 2015-08-17 NOTE — Progress Notes (Signed)
SLP Note  Patient Details Name: Marita SnellenFaye K Pigue MRN: [id number] DOB: 12/02/1926  MBS completed, posted under Imaging Pt may begin Dysphagia 1, honey thick liquids, full supervision.        Blenda MountsCouture, Gwendolyn Mclees Laurice 02-Jun-202016, 11:52 AM

## 2015-08-17 NOTE — Progress Notes (Signed)
Speech Language Pathology Treatment: Dysphagia  Patient Details Name: Lisa [name] MRN: [id number] DOB: 12/21/1926 Today's Date: 11-20-2014 Time: [id number] SLP Time Calculation (min) (ACUTE ONLY): 22 min  Assessment / Plan / Recommendation Clinical Impression  F/u after yesterday's swallow assessment.  Pt sitting in recliner, still aphonic though communicating, whispering.  Consumed trials of thin liquids, ice chips, and purees, all of which elicited throat-clearing, intermittent cough.  In combination with aphonia, pt presents with concerns for aspiration.  Recommend proceeding with MBS to elucidate source of dysphagia, potential diet, and treatment focus.  Pt and son agree.     HPI HPI: 81 year old female presents after a fall in a parking lot sustaining Bil Frontal Cerebral Contusions.Pt was initially evaluated 11/16 with recommendation for NPO, but then began to have tachycardia and respiratory decline with encephalopathy, pulmonary edema, and cardiomyopathy. she was intubated 11/16-11/23.  pt with hx of HTN, Bil "Foot" fxs, Pelvic fxs, R Wrist fx, Back surgeries, and Depression.       SLP Plan  MBS     Recommendations  Diet recommendations: NPO              Oral Care Recommendations: Oral care QID Follow up Recommendations: Inpatient Rehab Plan: MBS   [name], Lisa [name] Laurice 11-20-2014, 11:05 AM  Marchelle FolksAmanda L. Samson Fredericouture, [place] CCC/SLP Pager [phone]

## 2015-08-17 NOTE — Progress Notes (Signed)
Physical Therapy Treatment Patient Details Name: Lisa [name] MRN: [id number] DOB: 07/18/1927 Today's Date: 2020/08/1315    History of Present Illness pt presents after a fall in a parking lot sustaining Bil Frontal Cerebral Contusions.  pt initially evaluated on 11/15 then began to have Tachycardia and respiratory decline with Encephalopathy, Pulmonary Edema, and Cardiomyopathy.  pt intubated 11/16 - 11/23.  pt with hx of HTN, Bil "Foot" fxs, Pelvic fxs, R Wrist fx, Back surgeries, and Depression.      PT Comments    Pt more alert today than previous session and participates well with mobility, though has a delayed response.  Pt does attempt to mouth words and occasionally whispers.  Continue to feel pt will need CIR level of care at D/C.  Will continue to follow.    Follow Up Recommendations  CIR     Equipment Recommendations  None recommended by PT    Recommendations for Other Services       Precautions / Restrictions Precautions Precautions: Fall Restrictions Weight Bearing Restrictions: No    Mobility  Bed Mobility Overal bed mobility: Needs Assistance;+2 for physical assistance Bed Mobility: Supine to Sit     Supine to sit: Max assist;+2 for physical assistance     General bed mobility comments: pt needs cues and facilitation for bed mobility.  After a delay pt is able to participate in coming to sitting.    Transfers Overall transfer level: Needs assistance Equipment used: 2 person hand held assist Transfers: Sit to/from [employer]GI CorporationStand;Stand Pivot Transfers Sit to Stand: Max assist;+2 physical assistance Stand pivot transfers: Total assist;+2 physical assistance       General transfer comment: Repeated coming to stand x2 utilizing pad under hips to A with trunk/hips extension.  pt remains flexed posture and needs Bil feet blocked, but does participate with coming to stand.  Total A to complete pivot.    Ambulation/Gait                 Stairs             Wheelchair Mobility    Modified Rankin (Stroke Patients Only)       Balance Overall balance assessment: Needs assistance Sitting-balance support: Bilateral upper extremity supported;Feet supported Sitting balance-Leahy Scale: Poor Sitting balance - Comments: pt leans posteriorly and to R side and does attempt to correct balance when cued.   Postural control: Posterior lean;Right lateral lean Standing balance support: During functional activity Standing balance-Leahy Scale: Zero                      Cognition Arousal/Alertness: Awake/alert Behavior During Therapy: Flat affect Overall Cognitive Status: Impaired/Different from baseline Area of Impairment: Orientation;Attention;Memory;Following commands;Safety/judgement;Awareness;Problem solving   Current Attention Level: Sustained   Following Commands: Follows one step commands with increased time     Problem Solving: Slow processing;Decreased initiation;Difficulty sequencing;Requires verbal cues;Requires tactile cues General Comments: Difficult to assess cognition due to minimal verbalizations, but pt does nod head yes/no appropriately and follows simple directions with delay.  pt more alert today than previous session, but remains very flat.      Exercises      General Comments        Pertinent Vitals/Pain Pain Assessment: Faces Faces Pain Scale: Hurts little more Pain Location: Son indicating Bil wrists and back Pain Descriptors / Indicators: Grimacing Pain Intervention(s): Monitored during session;Premedicated before session;Repositioned    Home Living  Prior Function            PT Goals (current goals can now be found in the care plan section) Acute Rehab PT Goals Patient Stated Goal: Per son for pt to walk and talk again.   PT Goal Formulation: With family Time For Goal Achievement: 08/29/15 Potential to Achieve Goals: Good Progress towards PT goals: Progressing  toward goals    Frequency  Min 3X/week    PT Plan Current plan remains appropriate    Co-evaluation PT/OT/SLP Co-Evaluation/Treatment: Yes Reason for Co-Treatment: Complexity of the patient's impairments (multi-system involvement) PT goals addressed during session: Mobility/safety with mobility;Balance       End of Session Equipment Utilized During Treatment: Gait belt;Oxygen Activity Tolerance: Patient limited by fatigue Patient left: in chair;with call bell/phone within reach;with family/visitor present (with SLP)     Time: 9604-5409 PT Time Calculation (min) (ACUTE ONLY): 28 min  Charges:  $Therapeutic Activity: 8-22 mins                    G CodesSunny Schlein, Balm [phone]/06/2315, 11:10 AM

## 2015-08-17 NOTE — Progress Notes (Signed)
PT Cancellation Note  Patient Details Name: Lisa SnellenFaye K [name] MRN: [id number] DOB: 04/21/1927   Cancelled Treatment:    Reason Eval/Treat Not Completed: Medical issues which prohibited therapy.  Pt Tachy this am, will hold at this time and check back another time.     Lisa [name], Lisa MurrayMegan [name] 11-02-2014, 9:55 AM

## 2015-08-18 ENCOUNTER — Inpatient Hospital Stay (HOSPITAL_COMMUNITY): Payer: Medicare Other

## 2015-08-18 DIAGNOSIS — E876 Hypokalemia: Secondary | ICD-10-CM | POA: Diagnosis present

## 2015-08-18 DIAGNOSIS — N189 Chronic kidney disease, unspecified: Secondary | ICD-10-CM

## 2015-08-18 DIAGNOSIS — J14 Pneumonia due to Hemophilus influenzae: Secondary | ICD-10-CM

## 2015-08-18 DIAGNOSIS — L02429 Furuncle of limb, unspecified: Secondary | ICD-10-CM

## 2015-08-18 DIAGNOSIS — K625 Hemorrhage of anus and rectum: Secondary | ICD-10-CM

## 2015-08-18 DIAGNOSIS — N179 Acute kidney failure, unspecified: Secondary | ICD-10-CM | POA: Diagnosis present

## 2015-08-18 DIAGNOSIS — R1310 Dysphagia, unspecified: Secondary | ICD-10-CM | POA: Diagnosis present

## 2015-08-18 DIAGNOSIS — I5022 Chronic systolic (congestive) heart failure: Secondary | ICD-10-CM | POA: Diagnosis present

## 2015-08-18 LAB — GLUCOSE, CAPILLARY
GLUCOSE-CAPILLARY: 90 mg/dL (ref 65–99)
GLUCOSE-CAPILLARY: 193 mg/dL — AB (ref 65–99)
GLUCOSE-CAPILLARY: 88 mg/dL (ref 65–99)
GLUCOSE-CAPILLARY: 108 mg/dL — AB (ref 65–99)
Glucose-Capillary: 145 mg/dL — ABNORMAL HIGH (ref 65–99)
Glucose-Capillary: 117 mg/dL — ABNORMAL HIGH (ref 65–99)

## 2015-08-18 LAB — CBC WITH DIFFERENTIAL/PLATELET
BASOS PCT: 0 %
Basophils Absolute: 0 10*3/uL (ref 0.0–0.1)
Eosinophils Absolute: 0.1 10*3/uL (ref 0.0–0.7)
Eosinophils Relative: 1 %
HEMATOCRIT: 26.5 % — AB (ref 36.0–46.0)
HEMOGLOBIN: 8.4 g/dL — AB (ref 12.0–15.0)
LYMPHS PCT: 8 %
Lymphs Abs: 1 10*3/uL (ref 0.7–4.0)
MCH: 30.7 pg (ref 26.0–34.0)
MCHC: 31.7 g/dL (ref 30.0–36.0)
MCV: 96.7 fL (ref 78.0–100.0)
MONOS PCT: 4 %
Monocytes Absolute: 0.4 10*3/uL (ref 0.1–1.0)
NEUTROS ABS: 10.4 10*3/uL — AB (ref 1.7–7.7)
NEUTROS PCT: 87 %
Platelets: 334 10*3/uL (ref 150–400)
RBC: 2.74 MIL/uL — ABNORMAL LOW (ref 3.87–5.11)
RDW: 18.2 % — ABNORMAL HIGH (ref 11.5–15.5)
WBC: 11.9 10*3/uL — ABNORMAL HIGH (ref 4.0–10.5)

## 2015-08-18 LAB — BASIC METABOLIC PANEL
ANION GAP: 9 (ref 5–15)
BUN: 41 mg/dL — ABNORMAL HIGH (ref 6–20)
CALCIUM: 8.5 mg/dL — AB (ref 8.9–10.3)
CHLORIDE: 115 mmol/L — AB (ref 101–111)
CO2: 19 mmol/L — AB (ref 22–32)
Creatinine, Ser: 1.21 mg/dL — ABNORMAL HIGH (ref 0.44–1.00)
GFR calc non Af Amer: 39 mL/min — ABNORMAL LOW (ref >60)
GFR, EST AFRICAN AMERICAN: 46 mL/min — AB (ref >60)
Glucose, Bld: 90 mg/dL (ref 65–99)
POTASSIUM: 3.7 mmol/L (ref 3.5–5.1)
Sodium: 143 mmol/L (ref 135–145)

## 2015-08-18 NOTE — Progress Notes (Signed)
Upon assessment, RN noted boil on right anterior forearm.  Boil is raised but is not open.  Will continue to monitor.

## 2015-08-18 NOTE — Progress Notes (Signed)
Patient ID: [name], female   DOB: 08/16/1927, 81 y.o.   MRN: [id number] Arouses easily. States her name. Follows commands with all 4 extremities. No facial asymmetry. Overall stable.

## 2015-08-18 NOTE — Consult Note (Signed)
[facility] Medical Consultation  Lisa [name] ZOX:[id number] DOB: 04/26/1927 DOA: 08/03/2015 PCP: Rozanna Box, MD   Requesting physician: [facility] Date of consultation: 08/18/2015 Reason for consultation: Multiple medical problems  Impression/Recommendations Principal Problem:   Subdural hematoma (HCC) Active Problems:   Subarachnoid hemorrhage (HCC)   Acute respiratory failure with hypoxemia (HCC)   Hypernatremia   Pressure ulcer   Boil of upper extremity   Pneumonia due to Haemophilus influenzae (HCC)   Chronic systolic CHF (congestive heart failure) (HCC)   Acute on chronic renal failure (HCC)   Hypokalemia   Bright red blood per rectum   Dysphagia   Subdural hemorrhage, sub arachnoid hemorrhage > neuro function slowly improving -Per neurosurgery -PT/OT; recommends CIR -CIR evaluation pending -Out of bed to chair -Patient appears sluggish in her conversation which is a change per her son from earlier today/yesterday. -Not at baseline per son. 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  Acute hypoxic respiratory failure 2nd to Aspiration pneumonia/Haemophilus influenzae pneumonia -Completed 5 days antibiotics -Aspiration precautions -Monitor O2 saturation  Wide complex tachycardia  -Resolved -Amiodarone 200 mg daily -Metoprolol 12.5 mg BID  -Cardiology sign off 11/19, will need follow up with them if she is discharged  Chronic Systolic CHF -See wide-complex tachycardia -Transfuse for involving<8  Acute on chronic renal failure - baseline 1.4 -At baseline continue to monitor  Hypokalemia -Potassium goal > 4     Bright red blood per rectum  - resolved  Dysphagia -Diet dysphagia 1 honey thick liquid  Anemia F/u CBC  Check iron studies  Boil/right arm abscess? -Obtain soft tissue x-ray right forearm     I will followup again  tomorrow. Please contact me if I can be of assistance in the meanwhile. Thank you for this consultation.  Chief Complaint: SDH/SAH  HPI:  Lisa [name] is an 81 y.o. WF PMHx Depression, Pelvis Fracture, Right Wrist Fracture, Spinal Stenosis, Bilateral foot fracture.   She took fall while walking in a parking lot in an unorganized manner resulting in SDH/SAH. Brought to [facility], head ct showed intracranial blood. No evidence of an operative lesion. She had a waxing and waning exam. Transferred for further evaluation.     Review of symptoms The patient denies anorexia, fever, weight loss,, vision loss, decreased hearing, hoarseness, chest pain, syncope, dyspnea on exertion, peripheral edema, balance deficits, hemoptysis, abdominal pain, melena, hematochezia, severe indigestion/heartburn, hematuria, incontinence, genital sores, muscle weakness, suspicious skin lesions, transient blindness, difficulty walking, depression, unusual weight change, abnormal bleeding, enlarged lymph nodes, angioedema, and breast masses.   Consultants: Dr.Douglas B McQuaid PCCM Dr.[facility]   Procedure/Significant Events: 11/13 head CT >> stable BILATERAL frontal intracerebral hematomas, surrounding edema is more prominent  11/16 Echo >> EF 15%, RVSP 65 11/23 Echo >> EF 25%  SIGNIFICANT EVENTS: 11/11 fall with sah/sdh 11/16 Wide complex tachycardia, cardioversion x 2 11/16 intubated 11/17 runs of tachy on amio 11/23 echocardiogram;- Left ventricle: Moderate basal septal hypertrophy. Septal apical and distal anterior wall akinesis Inferior wall hypokinesis -LVEF=25%. 11/25 passed modified barium swallow test   Culture 11/11 MRSA by PCR negative 11/16 tracheal aspirate positive Haemophilus influenza   Antibiotics: Unasyn 11/16>> 11/21    DVT prophylaxis; SCD  Lines ETT 11/16>> 11/23    Past Medical History  Diagnosis Date  . Allergy   . Pelvis fracture (HCC)   . Wrist  fracture, right   .  Depression   . Spinal stenosis   . Osteoarthritis   . Hypertension   . Foot fracture     bilateral   Past Surgical History  Procedure Laterality Date  . Back surgery N/A   . Breast lumpectomy N/A    Social History:  reports that she has never smoked. She has never used smokeless tobacco. She reports that she does not drink alcohol or use illicit drugs.  Allergies  Allergen Reactions  . Ambien [Zolpidem Tartrate] Other (See Comments)    Reaction:  Hallucinations   . Oxycodone Nausea And Vomiting  . Shellfish Allergy Swelling and Other (See Comments)    Pts mouth and face swells.   . Ace Inhibitors Cough  . Daypro [Oxaprozin] Other (See Comments)    GI upset  . Norvasc [Amlodipine Besylate] Palpitations   Family History  Problem Relation Age of Onset  . COPD Mother   . COPD Father   . Heart disease Father     Prior to Admission medications   Medication Sig Start Date End Date Taking? Authorizing Provider  atenolol (TENORMIN) 50 MG tablet Take 50 mg by mouth daily.    Yes Historical Provider, MD  citalopram (CELEXA) 40 MG tablet Take 40 mg by mouth daily.   Yes Historical Provider, MD  HYDROcodone-acetaminophen (NORCO/VICODIN) 5-325 MG tablet Take 1 tablet by mouth 4 (four) times daily as needed for moderate pain.   Yes Historical Provider, MD  irbesartan (AVAPRO) 300 MG tablet Take 150 mg by mouth daily.    Yes Historical Provider, MD  aspirin EC 81 MG tablet Take 81 mg by mouth daily.    Historical Provider, MD  calcium-vitamin D (OSCAL WITH D) 500-200 MG-UNIT tablet Take 1 tablet by mouth 3 (three) times daily.    Historical Provider, MD  cholecalciferol (VITAMIN D) 1000 UNITS tablet Take 1,000 Units by mouth daily.    Historical Provider, MD  Multiple Vitamins-Minerals (ICAPS AREDS 2) CAPS Take 1 capsule by mouth 2 (two) times daily.     Historical Provider, MD   Physical Exam: Blood pressure 145/68, pulse 97, temperature 97.5 F (36.4 C),  temperature source Oral, resp. rate 28, height 5' (1.524 m), weight 68.6 kg (151 lb 3.8 oz), SpO2 100 %. Filed Vitals:   08/18/15 0720 08/18/15 1130 08/18/15 1133 08/18/15 1543  BP: 149/68 142/65  145/68  Pulse: 102 93  97  Temp: 98 F (36.7 C)  97.5 F (36.4 C)   TempSrc: Oral  Oral   Resp: Height:      Weight:      SpO2: 100% 100%  100%     General: A/O 3, (does not know why), slow to answer, No acute respiratory distress Eyes: Negative headache, eye pain, negative scleral hemorrhage ENT: Negative Runny nose, negative ear pain, negative gingival bleeding, Neck:  Negative scars, masses, torticollis, lymphadenopathy, JVD Lungs: Clear to auscultation bilaterally without wheezes or crackles Cardiovascular: Regular rate and rhythm without murmur gallop or rub normal S1 and S2 Abdomen:negative abdominal pain, nondistended, positive soft, bowel sounds, no rebound, no ascites, no appreciable mass Extremities: No significant cyanosis, clubbing, or edema bilateral lower extremities. Right forearm boil/abscess positive erythema~1.5 inch in diameter negative warmth to touch, positive pain to palpation Psychiatric: Unable to fully assess Neurologic:  Cranial nerves II through XII intact, tongue/uvula midline, all extremities muscle strength 5/5, sensation intact throughout, negative dysarthria, negative expressive aphasia, negative receptive aphasia.   Labs on Admission:  Basic Metabolic Panel:  Recent Labs Lab 08/12/15 0330  08/14/15 0301 08/15/15 0248 08/16/15 0322 08/17/15 0425 08/18/15 0307  NA 139  < > 138 140 142 143 143  K 3.7  < > 3.4* 3.6 3.8 3.4* 3.7  CL 112*  < > 105 104 107 111 115*  CO2 18*  < > 23 25 26 23  19*  GLUCOSE 148*  < > 110* 164* 120* 131* 90  BUN 49*  < > 49* 56* 51* 45* 41*  CREATININE 1.53*  < > 1.57* 1.58* 1.39* 1.35* 1.21*  CALCIUM 7.4*  < > 8.0* 8.0* 8.5* 8.6* 8.5*  MG 1.6*  --   --   --   --   --   --   PHOS 3.0  --   --   --   --   --    --   < > = values in this interval not displayed. Liver Function Tests: No results for input(s): AST, ALT, ALKPHOS, BILITOT, PROT, ALBUMIN in the last 168 hours. No results for input(s): LIPASE, AMYLASE in the last 168 hours. No results for input(s): AMMONIA in the last 168 hours. CBC:  Recent Labs Lab 08/13/15 0259 08/15/15 0248 08/16/15 0322 08/17/15 0425 08/18/15 0307  WBC 13.6* 15.4* 16.9* 15.4* 11.9*  NEUTROABS  --   --   --   --  10.4*  HGB 8.0* 7.9* 7.8* 8.2* 8.4*  HCT 24.5* 25.2* 25.1* 26.6* 26.5*  MCV 91.8 93.7 94.4 95.3 96.7  PLT 131* 216 275 329 334   Cardiac Enzymes: No results for input(s): CKTOTAL, CKMB, CKMBINDEX, TROPONINI in the last 168 hours. BNP: Invalid input(s): POCBNP CBG:  Recent Labs Lab 08/17/15 2259 08/18/15 0302 08/18/15 0811 08/18/15 1127 08/18/15 1555  GLUCAP 145* 90 117* 193* 88    Radiological Exams on Admission: Dg Swallowing Func-speech Pathology  2020/03/715  Objective Swallowing Evaluation:   Patient Details Name: Lisa [name] MRN: [id number] Date of Birth: 09/14/1927 Today's Date: 2020/03/715 Time: SLP Start Time (ACUTE ONLY): 1106-SLP Stop Time (ACUTE ONLY): 1134 SLP Time Calculation (min) (ACUTE ONLY): 28 min Past Medical History: Past Medical History Diagnosis Date . Allergy  . Pelvis fracture (HCC)  . Wrist fracture, right  . Depression  . Spinal stenosis  . Osteoarthritis  . Hypertension  . Foot fracture    bilateral Past Surgical History: Past Surgical History Procedure Laterality Date . Back surgery N/A  . Breast lumpectomy N/A  HPI: 81 year old female presents after a fall in a parking lot sustaining Bil Frontal Cerebral Contusions.Pt was initially evaluated 11/16 with recommendation for NPO, but then began to have tachycardia and respiratory decline with encephalopathy, pulmonary edema, and cardiomyopathy. she was intubated 11/16-11/23.  pt with hx of HTN, Bil "Foot" fxs, Pelvic fxs, R Wrist fx, Back surgeries, and Depression.   Subjective: pt sleepy Assessment / Plan / Recommendation CHL IP CLINICAL IMPRESSIONS 2020/03/715 Therapy Diagnosis Moderate oral phase dysphagia;Mild pharyngeal phase dysphagia Clinical Impression Pt presents with a mild-moderate oropharyngeal dysphagia marked by: 1) decreased activity of lingual musculature, oral pumping, leading to piecemeal bolusing; 2) adequate clearance of materials through pharynx into UES; 3) silent aspiration of nectar-thick liquids due to sensory impairments s/p prolonged intubation.  Pt may begin a dysphagia 1 diet, honey-thick liquids, which poses the least risk.  Meds should be crushed in puree.  Pt will require full supervision for verbal cueing for safety.  Son was present for study; results/recs reviewed; pt and son agree.  Impact on safety and  function Moderate aspiration risk   [facility] IP TREATMENT RECOMMENDATION 07-22-202016 Treatment Recommendations Therapy as outlined in treatment plan below   Prognosis 07-22-202016 Prognosis for Safe Diet Advancement Good Barriers to Reach Goals Cognitive deficits Barriers/Prognosis Comment -- [facility] IP DIET RECOMMENDATION 07-22-202016 SLP Diet Recommendations Dysphagia 1 (Puree) solids;Honey thick liquids Liquid Administration via Cup Medication Administration Crushed with puree Compensations Small sips/bites;Effortful swallow Postural Changes Seated upright at 90 degrees   CH[facility] IP OTHER RECOMMENDATIONS 07-22-202016 Recommended Consults -- Oral Care Recommendations Oral care BID Other Recommendations --   CH[facility] IP FOLLOW UP RECOMMENDATIONS 07-22-202016 Follow up Recommendations Inpatient Rehab   [facility] IP FREQUENCY AND DURATION 07-22-202016 Speech Therapy Frequency (ACUTE ONLY) min 2x/week Treatment Duration 2 weeks      CHL IP ORAL PHASE 07-22-202016 Oral Phase Impaired Oral - Pudding Teaspoon -- Oral - Pudding Cup -- Oral - Honey Teaspoon -- Oral - Honey Cup Lingual pumping;Reduced posterior propulsion;Piecemeal swallowing;Weak lingual manipulation Oral - Nectar  Teaspoon Lingual pumping;Reduced posterior propulsion;Piecemeal swallowing;Weak lingual manipulation Oral - Nectar Cup -- Oral - Nectar Straw -- Oral - Thin Teaspoon -- Oral - Thin Cup -- Oral - Thin Straw -- Oral - Puree Lingual pumping;Reduced posterior propulsion;Piecemeal swallowing;Weak lingual manipulation Oral - Mech Soft -- Oral - Regular -- Oral - Multi-Consistency -- Oral - Pill -- Oral Phase - Comment --  CHL IP PHARYNGEAL PHASE 07-22-202016 Pharyngeal Phase Impaired Pharyngeal- Pudding Teaspoon -- Pharyngeal -- Pharyngeal- Pudding Cup -- Pharyngeal -- Pharyngeal- Honey Teaspoon -- Pharyngeal -- Pharyngeal- Honey Cup Delayed swallow initiation-vallecula Pharyngeal -- Pharyngeal- Nectar Teaspoon Delayed swallow initiation-pyriform sinuses;Trace aspiration Pharyngeal -- Pharyngeal- Nectar Cup -- Pharyngeal -- Pharyngeal- Nectar Straw -- Pharyngeal -- Pharyngeal- Thin Teaspoon -- Pharyngeal -- Pharyngeal- Thin Cup -- Pharyngeal -- Pharyngeal- Thin Straw -- Pharyngeal -- Pharyngeal- Puree Delayed swallow initiation-vallecula Pharyngeal -- Pharyngeal- Mechanical Soft -- Pharyngeal -- Pharyngeal- Regular -- Pharyngeal -- Pharyngeal- Multi-consistency -- Pharyngeal -- Pharyngeal- Pill -- Pharyngeal -- Pharyngeal Comment silent aspiration nectars  No flowsheet data found. Amanda L. Samson Frederic, [place] CCC/SLP Pager [phone] Blenda Mounts Laurice 07-22-202016, 11:49 AM               EKG:   Care during the described time interval was provided by me .  I have reviewed this patient's available data, including medical history, events of note, physical examination, and all test results as part of my evaluation. I have personally reviewed and interpreted all radiology studies.  Time spent: 40 minutes  Drema Dallas [facility] Pager [phone]  If 7PM-7AM, please contact night-coverage [email or web address] Password [facility] 08/18/2015, 5:19 PM

## 2015-08-19 ENCOUNTER — Inpatient Hospital Stay (HOSPITAL_COMMUNITY): Payer: Medicare Other

## 2015-08-19 DIAGNOSIS — S065XAA Traumatic subdural hemorrhage with loss of consciousness status unknown, initial encounter: Secondary | ICD-10-CM | POA: Diagnosis present

## 2015-08-19 DIAGNOSIS — S065X9A Traumatic subdural hemorrhage with loss of consciousness of unspecified duration, initial encounter: Secondary | ICD-10-CM | POA: Diagnosis present

## 2015-08-19 LAB — CBC WITH DIFFERENTIAL/PLATELET
BASOS ABS: 0 10*3/uL (ref 0.0–0.1)
Basophils Relative: 0 %
Eosinophils Absolute: 0.1 10*3/uL (ref 0.0–0.7)
Eosinophils Relative: 1 %
HEMATOCRIT: 25.4 % — AB (ref 36.0–46.0)
HEMOGLOBIN: 7.9 g/dL — AB (ref 12.0–15.0)
LYMPHS PCT: 11 %
Lymphs Abs: 1 10*3/uL (ref 0.7–4.0)
MCH: 30 pg (ref 26.0–34.0)
MCHC: 31.1 g/dL (ref 30.0–36.0)
MCV: 96.6 fL (ref 78.0–100.0)
MONO ABS: 0.3 10*3/uL (ref 0.1–1.0)
Monocytes Relative: 3 %
NEUTROS ABS: 8.1 10*3/uL — AB (ref 1.7–7.7)
NEUTROS PCT: 85 %
Platelets: 354 10*3/uL (ref 150–400)
RBC: 2.63 MIL/uL — AB (ref 3.87–5.11)
RDW: 17.9 % — AB (ref 11.5–15.5)
WBC: 9.6 10*3/uL (ref 4.0–10.5)

## 2015-08-19 LAB — GLUCOSE, CAPILLARY
GLUCOSE-CAPILLARY: 144 mg/dL — AB (ref 65–99)
Glucose-Capillary: 105 mg/dL — ABNORMAL HIGH (ref 65–99)
Glucose-Capillary: 117 mg/dL — ABNORMAL HIGH (ref 65–99)
Glucose-Capillary: 109 mg/dL — ABNORMAL HIGH (ref 65–99)
Glucose-Capillary: 120 mg/dL — ABNORMAL HIGH (ref 65–99)
Glucose-Capillary: 96 mg/dL (ref 65–99)

## 2015-08-19 LAB — BASIC METABOLIC PANEL
Anion gap: 11 (ref 5–15)
BUN: 32 mg/dL — AB (ref 6–20)
CHLORIDE: 110 mmol/L (ref 101–111)
CO2: 21 mmol/L — ABNORMAL LOW (ref 22–32)
Calcium: 8.7 mg/dL — ABNORMAL LOW (ref 8.9–10.3)
Creatinine, Ser: 1.17 mg/dL — ABNORMAL HIGH (ref 0.44–1.00)
GFR calc non Af Amer: 41 mL/min — ABNORMAL LOW (ref >60)
GFR, EST AFRICAN AMERICAN: 47 mL/min — AB (ref >60)
Glucose, Bld: 123 mg/dL — ABNORMAL HIGH (ref 65–99)
POTASSIUM: 4 mmol/L (ref 3.5–5.1)
SODIUM: 142 mmol/L (ref 135–145)

## 2015-08-19 LAB — PREPARE RBC (CROSSMATCH)

## 2015-08-19 LAB — BLOOD GAS, ARTERIAL
ACID-BASE DEFICIT: 1.6 mmol/L (ref 0.0–2.0)
BICARBONATE: 21.2 meq/L (ref 20.0–24.0)
Drawn by: 28338
FIO2: 0.21
O2 SAT: 95.2 %
PATIENT TEMPERATURE: 98.6
TCO2: 22.1 mmol/L (ref 0–100)
pCO2 arterial: 27.8 mmHg — ABNORMAL LOW (ref 35.0–45.0)
pH, Arterial: 7.495 — ABNORMAL HIGH (ref 7.350–7.450)
pO2, Arterial: 76.7 mmHg — ABNORMAL LOW (ref 80.0–100.0)

## 2015-08-19 LAB — ABO/RH: ABO/RH(D): A NEG

## 2015-08-19 LAB — MAGNESIUM: MAGNESIUM: 1.6 mg/dL — AB (ref 1.7–2.4)

## 2015-08-19 MED ORDER — SODIUM CHLORIDE 0.9 % IV SOLN
Freq: Once | INTRAVENOUS | Status: AC
  Administered 2015-08-19: 14:00:00 via INTRAVENOUS

## 2015-08-19 MED ORDER — FUROSEMIDE 10 MG/ML IJ SOLN
40.0000 mg | Freq: Two times a day (BID) | INTRAMUSCULAR | Status: DC
  Administered 2015-08-20: 40 mg via INTRAVENOUS
  Filled 2015-08-19 (×3): qty 4

## 2015-08-19 MED ORDER — MAGNESIUM SULFATE 2 GM/50ML IV SOLN
2.0000 g | Freq: Once | INTRAVENOUS | Status: AC
  Administered 2015-08-19: 2 g via INTRAVENOUS
  Filled 2015-08-19: qty 50

## 2015-08-19 MED ORDER — LORAZEPAM 2 MG/ML IJ SOLN
1.0000 mg | Freq: Once | INTRAMUSCULAR | Status: AC
  Administered 2015-08-19: 1 mg via INTRAVENOUS
  Filled 2015-08-19: qty 1

## 2015-08-19 MED ORDER — FUROSEMIDE 10 MG/ML IJ SOLN
60.0000 mg | Freq: Once | INTRAMUSCULAR | Status: AC
  Administered 2015-08-19: 60 mg via INTRAVENOUS
  Filled 2015-08-19: qty 6

## 2015-08-19 MED ORDER — SODIUM CHLORIDE 0.9 % IJ SOLN
10.0000 mL | Freq: Two times a day (BID) | INTRAMUSCULAR | Status: DC
  Administered 2015-08-19: 20 mL

## 2015-08-19 MED ORDER — SODIUM CHLORIDE 0.9 % IJ SOLN
10.0000 mL | INTRAMUSCULAR | Status: DC | PRN
Start: 2015-08-19 — End: 2015-08-22

## 2015-08-19 NOTE — Consult Note (Signed)
[facility] Medical Consultation  BREANNE [name] GNF:[id number] DOB: [birth year] DOA: 08/03/2015 PCP: Rozanna Box, MD   Requesting physician: [facility] Date of consultation: 08/18/2015 Reason for consultation: Multiple medical problems  Impression/Recommendations Principal Problem:   Subdural hematoma (HCC) Active Problems:   Subarachnoid hemorrhage (HCC)   Acute respiratory failure with hypoxemia (HCC)   Hypernatremia   Pressure ulcer   Boil of upper extremity   Pneumonia due to Haemophilus influenzae (HCC)   Chronic systolic CHF (congestive heart failure) (HCC)   Acute on chronic renal failure (HCC)   Hypokalemia   Bright red blood per rectum   Dysphagia   Hypomagnesemia   SDH (subdural hematoma) (HCC)   Subdural hemorrhage, sub arachnoid hemorrhage > neuro function slowly improving -Per neurosurgery -PT/OT; recommends CIR -CIR evaluation pending -Out of bed to chair -Patient appears sluggish in her conversation, winded.  -Not at baseline per son. 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  Acute hypoxic respiratory failure 2nd to Aspiration pneumonia/Haemophilus influenzae pneumonia -Completed 5 days antibiotics -Aspiration precautions -Monitor O2 saturation  Acute respiratory alkalosis -Worsening SDH/SAH? Will obtain head CT -11/27 head CT; improved  Wide complex tachycardia  -Resolved -Continue Amiodarone 200 mg daily -Continue Metoprolol 12.5 mg BID  -Cardiology sign off 11/19, will need follow up with them if she is discharged  Chronic Systolic CHF -See wide-complex tachycardia -Transfuse for involving<8 -11/27 transfused 1 unit PRBC -Strict I&O since admission +16.2 L -Daily weight -CVP -Lasix 60 mg 1 -Lasix 40 mg BID starting on 11/28  Acute on chronic renal failure - baseline 1.4 -At baseline continue to  monitor  Hypokalemia -Potassium goal > 4    Hypomagnesemia -Magnesium goal> 2 -Magnesium IV 2 gm   Dysphagia -Diet dysphagia 1 honey thick liquid  Anemia F/u CBC  Check iron studies  Boil/right arm abscess? -Obtain soft tissue x-ray right forearm; nondiagnostic -Place K Pad; Apply to right forearm over boil. Will need to be I&D after comes to a head.     I will followup again tomorrow. Please contact me if I can be of assistance in the meanwhile. Thank you for this consultation.  Chief Complaint: SDH/SAH  HPI:  Lisa [name] is an 81 y.o. WF PMHx Depression, Pelvis Fracture, Right Wrist Fracture, Spinal Stenosis, Bilateral foot fracture.   She took fall while walking in a parking lot in an unorganized manner resulting in SDH/SAH. Brought to [facility], head ct showed intracranial blood. No evidence of an operative lesion. She had a waxing and waning exam. Transferred for further evaluation.     Review of symptoms The patient denies anorexia, fever, weight loss,, vision loss, decreased hearing, hoarseness, chest pain, syncope, dyspnea on exertion, peripheral edema, balance deficits, hemoptysis, abdominal pain, melena, hematochezia, severe indigestion/heartburn, hematuria, incontinence, genital sores, muscle weakness, suspicious skin lesions, transient blindness, difficulty walking, depression, unusual weight change, abnormal bleeding, enlarged lymph nodes, angioedema, and breast masses.   Consultants: Dr.Douglas B McQuaid PCCM Dr.[facility]   Procedure/Significant Events: 11/13 head CT >> stable BILATERAL frontal intracerebral hematomas, surrounding edema is more prominent  11/16 Echo >> EF 15%, RVSP 65 11/23 Echo >> EF 25%  SIGNIFICANT EVENTS: 11/11 fall with sah/sdh 11/16 Wide complex tachycardia, cardioversion x 2 11/16 intubated 11/17 runs of tachy on amio 11/23 echocardiogram;- Left ventricle: Moderate basal septal hypertrophy. Septal  apical and distal anterior wall akinesis Inferior wall hypokinesis -LVEF=25%. 11/25  passed modified barium swallow test 11/26 right forearm x-rays; negative osteomyelitis, positive soft tissue swelling; cellulitis/dermatitis 11/27 PCXR; Worsening bibasilar opacification likely small to moderate left effusion and small right effusion with associated atelectasis. -Mild stable cardiomegaly and possible minimal vascular congestion. 11/27 transfused 1 unit PRBC    Culture 11/11 MRSA by PCR negative 11/16 tracheal aspirate positive Haemophilus influenza   Antibiotics: Unasyn 11/16>> 11/21    DVT prophylaxis; SCD  Lines ETT 11/16>> 11/23    Past Medical History  Diagnosis Date  . Allergy   . Pelvis fracture (HCC)   . Wrist fracture, right   . Depression   . Spinal stenosis   . Osteoarthritis   . Hypertension   . Foot fracture     bilateral   Past Surgical History  Procedure Laterality Date  . Back surgery N/A   . Breast lumpectomy N/A    Social History:  reports that she has never smoked. She has never used smokeless tobacco. She reports that she does not drink alcohol or use illicit drugs.  Allergies  Allergen Reactions  . Ambien [Zolpidem Tartrate] Other (See Comments)    Reaction:  Hallucinations   . Oxycodone Nausea And Vomiting  . Shellfish Allergy Swelling and Other (See Comments)    Pts mouth and face swells.   . Ace Inhibitors Cough  . Daypro [Oxaprozin] Other (See Comments)    GI upset  . Norvasc [Amlodipine Besylate] Palpitations   Family History  Problem Relation Age of Onset  . COPD Mother   . COPD Father   . Heart disease Father     Prior to Admission medications   Medication Sig Start Date End Date Taking? Authorizing Provider  atenolol (TENORMIN) 50 MG tablet Take 50 mg by mouth daily.    Yes Historical Provider, MD  citalopram (CELEXA) 40 MG tablet Take 40 mg by mouth daily.   Yes Historical Provider, MD  HYDROcodone-acetaminophen  (NORCO/VICODIN) 5-325 MG tablet Take 1 tablet by mouth 4 (four) times daily as needed for moderate pain.   Yes Historical Provider, MD  irbesartan (AVAPRO) 300 MG tablet Take 150 mg by mouth daily.    Yes Historical Provider, MD  aspirin EC 81 MG tablet Take 81 mg by mouth daily.    Historical Provider, MD  calcium-vitamin D (OSCAL WITH D) 500-200 MG-UNIT tablet Take 1 tablet by mouth 3 (three) times daily.    Historical Provider, MD  cholecalciferol (VITAMIN D) 1000 UNITS tablet Take 1,000 Units by mouth daily.    Historical Provider, MD  Multiple Vitamins-Minerals (ICAPS AREDS 2) CAPS Take 1 capsule by mouth 2 (two) times daily.     Historical Provider, MD   Physical Exam: Blood pressure 154/78, pulse 103, temperature 98.5 F (36.9 C), temperature source Oral, resp. rate 33, height 5' (1.524 m), weight 68.9 kg (151 lb 14.4 oz), SpO2 100 %. Filed Vitals:   08/19/15 1345 08/19/15 1400 08/19/15 1555 08/19/15 1558  BP: 150/68 137/64 154/78   Pulse: 103     Temp: 97.7 F (36.5 C) 98.5 F (36.9 C) 98.5 F (36.9 C) 98.5 F (36.9 C)  TempSrc: Oral Oral Oral Oral  Resp: 33     Height:      Weight:      SpO2: 100%        General: A/O 3, (does not know why), is very weak but understandable, positive acute respiratory distress (SPO2= 100% but Tachypnic ), secondary to fluid overload Eyes: Negative headache, eye pain, negative scleral  hemorrhage ENT: Negative Runny nose, negative ear pain, negative gingival bleeding, Neck:  Negative scars, masses, torticollis, lymphadenopathy, JVD Lungs: Clear to auscultation bilaterally without wheezes or crackles Cardiovascular: Tachycardia, Regular rhythm without murmur gallop or rub normal S1 and S2 Abdomen:negative abdominal pain, nondistended, positive soft, bowel sounds, no rebound, no ascites, no appreciable mass Extremities: No significant cyanosis, clubbing, or edema bilateral lower extremities. Right forearm boil/abscess positive erythema~2.5 inch  in diameter (has increased overnight), negative warmth to touch, positive pain to palpation Psychiatric: Unable to fully assess Neurologic:  Cranial nerves II through XII intact, tongue/uvula midline, all extremities muscle strength 5/5, sensation intact throughout, finger to nose to finger WNL bilateral, negative dysarthria, negative expressive aphasia, negative receptive aphasia.   Labs on Admission:  Basic Metabolic Panel:  Recent Labs Lab 08/15/15 0248 08/16/15 0322 08/17/15 0425 08/18/15 0307 08/19/15 0330  NA 140 142 143 143 142  K 3.6 3.8 3.4* 3.7 4.0  CL 104 107 111 115* 110  CO2 25 26 23  19* 21*  GLUCOSE 164* 120* 131* 90 123*  BUN 56* 51* 45* 41* 32*  CREATININE 1.58* 1.39* 1.35* 1.21* 1.17*  CALCIUM 8.0* 8.5* 8.6* 8.5* 8.7*  MG  --   --   --   --  1.6*   Liver Function Tests: No results for input(s): AST, ALT, ALKPHOS, BILITOT, PROT, ALBUMIN in the last 168 hours. No results for input(s): LIPASE, AMYLASE in the last 168 hours. No results for input(s): AMMONIA in the last 168 hours. CBC:  Recent Labs Lab 08/15/15 0248 08/16/15 0322 08/17/15 0425 08/18/15 0307 08/19/15 0330  WBC 15.4* 16.9* 15.4* 11.9* 9.6  NEUTROABS  --   --   --  10.4* 8.1*  HGB 7.9* 7.8* 8.2* 8.4* 7.9*  HCT 25.2* 25.1* 26.6* 26.5* 25.4*  MCV 93.7 94.4 95.3 96.7 96.6  PLT 216 275 329 334 354   Cardiac Enzymes: No results for input(s): CKTOTAL, CKMB, CKMBINDEX, TROPONINI in the last 168 hours. BNP: Invalid input(s): POCBNP CBG:  Recent Labs Lab 08/18/15 1910 08/18/15 2314 08/19/15 0315 08/19/15 0725 08/19/15 1452  GLUCAP 108* 105* 117* 109* 144*    Radiological Exams on Admission: Dg Forearm Right  25-Jan-202016  CLINICAL DATA:  Right mid forearm swelling, possible abscess EXAM: RIGHT FOREARM - 2 VIEW COMPARISON:  None. FINDINGS: Two views of the right forearm submitted. No acute fracture or subluxation. Soft tissue swelling mid forearm. Cellulitis or dermatitis cannot be  excluded. Clinical correlation is necessary. IMPRESSION: No acute fracture or subluxation. No evidence of osteomyelitis. Soft tissue swelling mid forearm. Cellulitis or dermatitis cannot be excluded. Electronically Signed   By: Natasha Mead M.D.   On: 25-Jan-202016 18:36   Ct Head Wo Contrast  08/19/2015  CLINICAL DATA:  Subdural and subarachnoid hemorrhage improving but now with acute mental status change. EXAM: CT HEAD WITHOUT CONTRAST TECHNIQUE: Contiguous axial images were obtained from the base of the skull through the vertex without intravenous contrast. COMPARISON:  08/05/2015 FINDINGS: Examination demonstrates continued overall improvement with residual low-density right convexity subdural collection and no remaining acute subdural hemorrhage. Resolution of patchy previously seen by convexity acute subarachnoid blood. Improving bifrontal parenchymal hemorrhage with continued bifrontal edema. Suggestion of subtle midline shift to the left of 3 mm not seen previously. Ventricles and cisterns are otherwise within normal. Persistent moderate opacification of the right maxillary sinus with improved opacification of the left maxillary sinus. No other new changes. IMPRESSION: Continued overall improvement in patient's previously noted acute right subdural hematoma  with residual low-density subdural collection. Near resolution of previously seen patchy biconvexity areas of acute subarachnoid hemorrhage and bifrontal parenchymal hemorrhage with residual edema over the bifrontal region. Subtle new midline shift to the left of 3 mm. These results will be called to the ordering clinician or representative by the Radiologist Assistant, and communication documented in the PACS or zVision Dashboard. Electronically Signed   By: Elberta Fortisaniel  Boyle M.D.   On: 08/19/2015 09:52   Dg Chest Port 1 View  08/19/2015  CLINICAL DATA:  Shortness of breath. EXAM: PORTABLE CHEST 1 VIEW COMPARISON:  08/14/2015 FINDINGS: Lungs are  adequately inflated with worsening left base opacification likely small to moderate effusion with associated atelectasis. Slight worsening minimal right basilar opacification likely small amount of pleural fluid/ atelectasis. Cannot completely exclude infection in the lung bases. Mild prominence of the perihilar markings. Mild stable cardiomegaly. Remainder of the exam is unchanged. IMPRESSION: Worsening bibasilar opacification likely small to moderate left effusion and small right effusion with associated atelectasis. Cannot completely exclude infection in the lung bases. Mild stable cardiomegaly and possible minimal vascular congestion. Electronically Signed   By: Elberta Fortisaniel  Boyle M.D.   On: 08/19/2015 08:43    EKG:   Care during the described time interval was provided by me .  I have reviewed this patient's available data, including medical history, events of note, physical examination, and all test results as part of my evaluation. I have personally reviewed and interpreted all radiology studies.  Time spent: 40 minutes  Drema DallasWOODS, Phyllistine Domingos J [facility] Pager [phone]  If 7PM-7AM, please contact night-coverage [email or web address] Password [email or web address] 08/19/2015, 4:45 PM

## 2015-08-19 NOTE — Progress Notes (Signed)
Pt alert and able to tell me her name , dob and [place] [facility] [place]. Pt is sob , O2 sats are 100% on room air. Respiration rate is 22-26.  Dr Wynetta Emeryram came in and ordered chest x-ray. I  Called  Dr Joseph ArtWoods and EKG , ABG and CT ordered. Marisue Ivanobyn Persephone Schriever RN

## 2015-08-19 NOTE — Progress Notes (Signed)
Peripherally Inserted Central Catheter/Midline Placement  The IV Nurse has discussed with the patient and/or persons authorized to consent for the patient, the purpose of this procedure and the potential benefits and risks involved with this procedure.  The benefits include less needle sticks, lab draws from the catheter and patient may be discharged home with the catheter.  Risks include, but not limited to, infection, bleeding, blood clot (thrombus formation), and puncture of an artery; nerve damage and irregular heat beat.  Alternatives to this procedure were also discussed.  PICC/Midline Placement Documentation  PICC / Midline Double Lumen 08/19/15 PICC Right Basilic 36 cm 0 cm (Active)  Indication for Insertion or Continuance of Line Poor Vasculature-patient has had multiple peripheral attempts or PIVs lasting less than 24 hours 08/19/2015 10:27 AM  Exposed Catheter (cm) 0 cm 08/19/2015 10:27 AM  Site Assessment Clean;Dry;Intact 08/19/2015 10:27 AM  Lumen #1 Status Flushed;Saline locked;Blood return noted 08/19/2015 10:27 AM  Lumen #2 Status Flushed;Saline locked;Blood return noted 08/19/2015 10:27 AM  Dressing Type Transparent 08/19/2015 10:27 AM  Dressing Change Due 08/26/15 08/19/2015 10:27 AM       Ethelda Chickurrie, Lorrene Graef Robert 08/19/2015, 10:28 AM

## 2015-08-19 NOTE — Progress Notes (Signed)
Patient ID: Lisa SnellenFaye K [name], female   DOB: 08/15/1927, 81 y.o.   MRN: [id number]  Patient has no Complaints although appears short of breath. Discussions with the nurse say this began a couple hours ago her oxygen saturation appears to been maintained.  Neurologically patient is awake and alert she's oriented 3 strength appears to be 5 out of 5 with no pronator drift  Will order a chest x-ray and notify [facility]

## 2015-08-19 NOTE — Progress Notes (Signed)
Pt pulled out piv in left hand. Order received and picc line placed in right upper arm. Wrapped upper arm with kerlex to help conceal IV to help avoid picc being pulled out by pt. Pt slightly confused and likes to pull at things. Pt has also pulled off stat lock , will replace. Also got pt an activity pad to give her something to occupy her hands with.  Son at bedside. Bed alarm is on. Marisue Ivanobyn Deletha Jaffee RN

## 2015-08-20 DIAGNOSIS — I5022 Chronic systolic (congestive) heart failure: Secondary | ICD-10-CM

## 2015-08-20 DIAGNOSIS — R0682 Tachypnea, not elsewhere classified: Secondary | ICD-10-CM

## 2015-08-20 DIAGNOSIS — R000 Tachycardia, unspecified: Secondary | ICD-10-CM

## 2015-08-20 DIAGNOSIS — M159 Polyosteoarthritis, unspecified: Secondary | ICD-10-CM

## 2015-08-20 DIAGNOSIS — M4806 Spinal stenosis, lumbar region: Secondary | ICD-10-CM

## 2015-08-20 DIAGNOSIS — N189 Chronic kidney disease, unspecified: Secondary | ICD-10-CM

## 2015-08-20 DIAGNOSIS — D62 Acute posthemorrhagic anemia: Secondary | ICD-10-CM

## 2015-08-20 DIAGNOSIS — R1310 Dysphagia, unspecified: Secondary | ICD-10-CM

## 2015-08-20 LAB — BASIC METABOLIC PANEL
Anion gap: 9 (ref 5–15)
BUN: 32 mg/dL — AB (ref 6–20)
CO2: 25 mmol/L (ref 22–32)
Calcium: 8.3 mg/dL — ABNORMAL LOW (ref 8.9–10.3)
Chloride: 105 mmol/L (ref 101–111)
Creatinine, Ser: 1.36 mg/dL — ABNORMAL HIGH (ref 0.44–1.00)
GFR calc Af Amer: 40 mL/min — ABNORMAL LOW (ref >60)
GFR, EST NON AFRICAN AMERICAN: 34 mL/min — AB (ref >60)
GLUCOSE: 104 mg/dL — AB (ref 65–99)
POTASSIUM: 3.8 mmol/L (ref 3.5–5.1)
Sodium: 139 mmol/L (ref 135–145)

## 2015-08-20 LAB — TYPE AND SCREEN
ABO/RH(D): A NEG
Antibody Screen: NEGATIVE
Unit division: 0

## 2015-08-20 LAB — GLUCOSE, CAPILLARY
GLUCOSE-CAPILLARY: 90 mg/dL (ref 65–99)
GLUCOSE-CAPILLARY: 183 mg/dL — AB (ref 65–99)
GLUCOSE-CAPILLARY: 213 mg/dL — AB (ref 65–99)
Glucose-Capillary: 103 mg/dL — ABNORMAL HIGH (ref 65–99)
Glucose-Capillary: 109 mg/dL — ABNORMAL HIGH (ref 65–99)
Glucose-Capillary: 151 mg/dL — ABNORMAL HIGH (ref 65–99)
Glucose-Capillary: 97 mg/dL (ref 65–99)

## 2015-08-20 LAB — MAGNESIUM: Magnesium: 2.2 mg/dL (ref 1.7–2.4)

## 2015-08-20 MED ORDER — CITALOPRAM HYDROBROMIDE 20 MG PO TABS
20.0000 mg | ORAL_TABLET | Freq: Every day | ORAL | Status: DC
  Administered 2015-08-20 – 2015-08-22 (×3): 20 mg via ORAL
  Filled 2015-08-20 (×4): qty 1

## 2015-08-20 MED ORDER — INSULIN ASPART 100 UNIT/ML ~~LOC~~ SOLN
0.0000 [IU] | Freq: Three times a day (TID) | SUBCUTANEOUS | Status: DC
  Administered 2015-08-22: 1 [IU] via SUBCUTANEOUS

## 2015-08-20 NOTE — Progress Notes (Signed)
Speech Language Pathology Treatment: Dysphagia  Patient Details Name: Lisa SnellenFaye K [name] MRN: [id number] DOB: 02/21/1927 Today's Date: 08/20/2015 Time: [id number] SLP Time Calculation (min) (ACUTE ONLY): 15 min  Assessment / Plan / Recommendation Clinical Impression  During dysphagia treatment, pt tolerated dysphagia 1 (puree) and honey thick liquids with an immediate cough x1 and intermittent oral holding. Oral holding decreases with self feeding. SLP provided moderate verbal cues and education to pt's son to instruct her to swallow. SLP recommends pt remain on current diet with full supervision for verbal reminders. SLP will f/u with check for diet tolerance.   Pt's son stated the pt's baseline function is that she is independent with ADLs and very social, noticed a significant change in presentation post fall. May benefit from a speech-language evaluation.    HPI HPI: 81 year old female presents after a fall in a parking lot sustaining Bil Frontal Cerebral Contusions.Pt was initially evaluated 11/16 with recommendation for NPO, but then began to have tachycardia and respiratory decline with encephalopathy, pulmonary edema, and cardiomyopathy. she was intubated 11/16-11/23.  pt with hx of HTN, Bil "Foot" fxs, Pelvic fxs, R Wrist fx, Back surgeries, and Depression.       SLP Plan  Continue with current plan of care     Recommendations  Diet recommendations: Dysphagia 1 (puree);Honey-thick liquid Liquids provided via: Cup Medication Administration: Crushed with puree Supervision: Full supervision/cueing for compensatory strategies;Staff to assist with self feeding Compensations: Small sips/bites;Effortful swallow Postural Changes and/or Swallow Maneuvers: Seated upright 90 degrees              Oral Care Recommendations: Oral care QID Follow up Recommendations: Inpatient Rehab Plan: Continue with current plan of care   Riccardo DubinKristen Jeree Delcid 08/20/2015, 12:21 PM

## 2015-08-20 NOTE — Progress Notes (Signed)
Dr. Franky Machoabbell notied due to morning pupil assessment revealing pin point non-reactive pupils, pt respiratory rate also continues to remain in the upper 20's to 30's with bilaterally diminished breath sounds. Returned call received with no new orders at this time. Will continue to monitor.

## 2015-08-20 NOTE — Progress Notes (Signed)
OT Cancellation Note  Patient Details Name: Lisa SnellenFaye K [name] MRN: [id number] DOB: 11/04/1926   Cancelled Treatment:    Reason Eval/Treat Not Completed: Other (comment) Read nurse's recent note about pt's high respiratory rate and with diminished breath sounds. Spoke with nurse and she reports that pt not responding well today. Will hold OT treatment at this time.  Earlie RavelingStraub, Emilija Bohman L OTR/L [phone] 08/20/2015, 8:55 AM

## 2015-08-20 NOTE — Progress Notes (Addendum)
Upon initial assessment pt respond to pain only pupils 2 and sluggish bilaterally.  Pt had received  1 mg ativan @1600  respirations in the 30's  Which had pervious ly been acknowledge  By Dr Joseph ArtWoods and Primary on day shift.  At 2300 assesment pt now answers to voice and is grasping rail with right hand still not answering commands and or questions. Respirations still in 30's other VSS.  [facility] [place] to notify of above no new orders given will continue to monitor.  Dr Wynetta Emeryram  Returned page to notify of above, no new orders given.

## 2015-08-20 NOTE — Care Management Important Message (Signed)
Important Message  Patient Details  Name: Lisa SnellenFaye K [name] MRN: [id number] Date of Birth: 12/26/1926   Medicare Important Message Given:  Yes    [name], [name] 08/20/2015, 3:32 PM

## 2015-08-20 NOTE — Progress Notes (Signed)
Patient ID: Lisa [name], female   DOB: 08/17/1927, 81 y.o.   MRN: [id number] BP 134/56 mmHg  Pulse 99  Temp(Src) 98.1 F (36.7 C) (Oral)  Resp 23  Ht 5\' 3"  (1.6 m)  Wt 66.089 kg (145 lb 11.2 oz)  BMI 25.82 kg/m2  SpO2 100% Alert, following commands. Voice extremely weak Moving all extremities CT shows probable small subdural on the right causing the midline shift. No reason at this time to even contemplate surgical decompression.  Markedly improved since last week.  Appreciate rehab consideration.

## 2015-08-20 NOTE — Progress Notes (Addendum)
Nutrition Follow-up  DOCUMENTATION CODES:   Not applicable  INTERVENTION:    Magic cup TID with meals, each supplement provides 290 kcal and 9 grams of protein  Pudding BID between meals to give with medications.  NUTRITION DIAGNOSIS:   Inadequate oral intake related to dysphagia, lethargy/confusion as evidenced by meal completion < 50%.  Ongoing  GOAL:   Patient will meet greater than or equal to 90% of their needs  Unmet  MONITOR:   PO intake, Supplement acceptance, Weight trends, Labs, Skin  REASON FOR ASSESSMENT:   Ventilator (Intubated 11/16) Enteral/tube feeding initiation and management  ASSESSMENT:   Pt admitted on 08/03/15 s/p fall with subarachnoid hemorrhage.  Patient now receiving a dysphagia 1 diet with honey thick liquids, consuming 25% of meals on average. SLP following for swallowing function, to continue same diet for now. Intake inadequate, will add supplements. Labs reviewed: magnesium WNL at 2.2.  Diet Order:  DIET - DYS 1 Room service appropriate?: Yes; Fluid consistency:: Honey Thick  Skin:  Wound (see comment) (stage II pressure injury to coccyx)  Last BM:  11/27  Height:   Ht Readings from Last 1 Encounters:  08/20/15 5\' 3"  (1.6 m)    Weight:   Wt Readings from Last 1 Encounters:  08/20/15 145 lb 11.2 oz (66.089 kg)    Ideal Body Weight:  52.2 kg  BMI:  Body mass index is 25.82 kg/(m^2).  Estimated Nutritional Needs:   Kcal:  1450-1650  Protein:  80-95 gm  Fluid:  >1.6 L  EDUCATION NEEDS:   No education needs identified at this time  Joaquin CourtsKimberly Harris, RD, LDN, CNSC Pager [phone] After Hours Pager [phone]

## 2015-08-20 NOTE — Progress Notes (Signed)
Rehab admissions - Please see rehab consult done today by Dr. Allena KatzPatel.  I will follow up tomorrow to discuss rehab options with patient and family.  Call me for questions.  #[phone]

## 2015-08-20 NOTE — Consult Note (Addendum)
Physical Medicine and Rehabilitation Consult  Reason for Consult: TBI Referring Physician: Dr. Joseph Art.    HPI:  Lisa [name] is a 81 y.o. female with history of HTN, spinal stenosis with BLE weakness and post laminectomy syndrome with chronic pain who was admitted on 08/03/15 with fall while walking in a parking lot.  History taken from son is at bedside and chart review.  CT head done at [facility] revealing acute traumatic SAH along frontal convexities and acute SDH  Along right convexity and left tentorium. Patient was noted to be confused with waxing and waining of mental status and was transferred to [facility] for treatment. She was evaluated by Dr. Alphonzo Lemmings who recommended conservative management with serial CCT. Follow up CCT showed delayed development of bilateral frontal ICH hematomas with edema and continued right SDH.  Hospital course complicated by wide complex tachycardia with cardio-pulmonary arrest. She was intubated on 11/16 and started on IV antibiotics due to concern of aspiration PNA and large mucous plug suctioned past intubation. Sputum culture positive for H flu PNA.  Cardiology consulted for input on NSVT and elevated trops.  She was started on IV amiodarone and lopressor as not candidate for invasive workup. 2 D echo done revealing EF 15-20% with akinesis of entire anteroseptal myocardium likely due to stress induced cardiomyopathy.   Follow up echo on 11/23 showed improvement in EF to 25%. She was slowly weaned from vent and   tolerated extubation on 11/23.   Hyponatremia being managed with normal saline and mentation continues to fluctuate.  She was started on dysphagia 1, honey thick as alertness is improving and therapy ongoing. Patient with resultant balance deficits with posterior bias with standing attempts, BLE instability requiring blocking as well as delayed processing. She continues to have bouts of lethargy and follow up CT yesterday showed continued overall improvement with  near resolution of SAH and bifrontal parenchymal hemorrhage as well as subtle new 3 mm midline shift.  Therapy held today due to decrease in responsiveness and  respiratory issues.  CIR recommended by MD and rehab team for follow up therapy.    Son lives in [place]--reports that cousins live in [place] and patient was assisting multiple older friends.  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    Review of Systems  Unable to perform ROS: medical condition      Past Medical History  Diagnosis Date  . Allergy   . Pelvis fracture (HCC)   . Wrist fracture, right   . Depression   . Spinal stenosis   . Osteoarthritis   . Hypertension   . Foot fracture     bilateral   Past Surgical History  Procedure Laterality Date  . Back surgery N/A   . Breast lumpectomy N/A     Family History  Problem Relation Age of Onset  . COPD Mother   . COPD Father   . Heart disease Father     Social History:  Lives alone. Independent PTA without AD.  He lives in a condominium that is handicapped is accessible.  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 Per  reports that she has never smoked. She has never used smokeless tobacco. Per reports that she does not drink alcohol or use illicit drugs.    Allergies  Allergen Reactions  . Ambien [Zolpidem Tartrate] Other (See Comments)    Reaction:  Hallucinations   . Oxycodone Nausea  And Vomiting  . Shellfish Allergy Swelling and Other (See Comments)    Pts mouth and face swells.   . Ace Inhibitors Cough  . Daypro [Oxaprozin] Other (See Comments)    GI upset  . Norvasc [Amlodipine Besylate] Palpitations    Facility-administered medications prior to admission  Medication Dose Route Frequency Provider Last Rate Last Dose  . lactated ringers infusion 1,000 mL  1,000 mL Intravenous Continuous Ewing SchleinGregory Crisp, MD       Medications Prior to Admission  Medication Sig Dispense Refill  . atenolol (TENORMIN) 50 MG  tablet Take 50 mg by mouth daily.     . citalopram (CELEXA) 40 MG tablet Take 40 mg by mouth daily.    [name]. HYDROcodone-acetaminophen (NORCO/VICODIN) 5-325 MG tablet Take 1 tablet by mouth 4 (four) times daily as needed for moderate pain.    [name]. irbesartan (AVAPRO) 300 MG tablet Take 150 mg by mouth daily.     [name]. aspirin EC 81 MG tablet Take 81 mg by mouth daily.    . calcium-vitamin D (OSCAL WITH D) 500-200 MG-UNIT tablet Take 1 tablet by mouth 3 (three) times daily.    . cholecalciferol (VITAMIN D) 1000 UNITS tablet Take 1,000 Units by mouth daily.    . Multiple Vitamins-Minerals (ICAPS AREDS 2) CAPS Take 1 capsule by mouth 2 (two) times daily.       Home: Home Living Family/patient expects to be discharged to:: Inpatient rehab Living Arrangements: Alone  Functional History: Prior Function Level of Independence: Independent Comments: pt lives alone and does her own driving.   Functional Status:  Mobility: Bed Mobility Overal bed mobility: Needs Assistance, +2 for physical assistance Bed Mobility: Supine to Sit Supine to sit: Max assist, +2 for physical assistance Sit to supine: Max assist, +2 for physical assistance General bed mobility comments: pt needs cues and facilitation for bed mobility.  After a delay pt is able to participate in coming to sitting.   Transfers Overall transfer level: Needs assistance Equipment used: 2 person hand held assist Transfers: Sit to/from Stand, Stand Pivot Transfers Sit to Stand: Max assist, +2 physical assistance Stand pivot transfers: Total assist, +2 physical assistance General transfer comment: Repeated coming to stand x2 utilizing pad under hips to A with trunk/hips extension.  pt remains flexed posture and needs Bil feet blocked, but does participate with coming to stand.  Total A to complete pivot.        ADL: ADL Overall ADL's : Needs assistance/impaired Eating/Feeding: NPO Grooming: Moderate assistance Grooming Details (indicate cue  type and reason): Able to assist with oral care using R nondominant hand Upper Body Bathing: Maximal assistance Lower Body Bathing: Maximal assistance Upper Body Dressing : Maximal assistance Lower Body Dressing: Total assistance Toilet Transfer: +2 for physical assistance, Maximal assistance Toilet Transfer Details (indicate cue type and reason): simulated Toileting- Clothing Manipulation and Hygiene: Total assistance Functional mobility during ADLs: Maximal assistance, +2 for physical assistance  Cognition: Cognition Overall Cognitive Status: Impaired/Different from baseline Orientation Level: Other (comment) (unable to asses) Cognition Arousal/Alertness: Awake/alert Behavior During Therapy: Flat affect Overall Cognitive Status: Impaired/Different from baseline Area of Impairment: Orientation, Attention, Memory, Following commands, Safety/judgement, Awareness, Problem solving Orientation Level:  (able to state she was in the hospital) Current Attention Level: Sustained Following Commands: Follows one step commands with increased time Safety/Judgement: Decreased awareness of deficits Awareness: Intellectual Problem Solving: Slow processing, Decreased initiation, Difficulty sequencing, Requires verbal cues, Requires tactile cues General Comments: Difficult to assess cognition due to minimal  verbalizations, but pt does nod head yes/no appropriately and follows simple directions with delay.  pt more alert today than previous session, but remains very flat.   Difficult to assess due to: Impaired communication (minimal hoarse whispered verbalizations)  Blood pressure 110/55, pulse 83, temperature 97.5 F (36.4 C), temperature source Axillary, resp. rate 26, height  (1.6 m), weight 66.089 kg (145 lb 11.2 oz), SpO2 100 %. Physical Exam  Nursing note and vitals reviewed. Constitutional: She appears well-developed and well-nourished. She appears lethargic. She is sleeping. She has a  sickly appearance. Nasal cannula in place.  HENT:  Head: Normocephalic.  Right Ear: External ear normal.  Left Ear: External ear normal.  Resolving ecchymosis left temple.   Eyes: Conjunctivae are normal.  Opened eyes briefly--pupils pinpoint.   Neck: No tracheal deviation present. No thyromegaly present.  Cardiovascular: Normal rate and regular rhythm.   No murmur heard. Respiratory: No accessory muscle usage or stridor. Tachypnea noted. No respiratory distress. She has decreased breath sounds. She has rhonchi in the right lower field and the left lower field. She exhibits no tenderness.   Increased work of breathing  GI: Soft. Bowel sounds are normal. She exhibits no distension.  Musculoskeletal: She exhibits no edema.  PROM WNL  Neurological: She appears lethargic.  Hard to arouse and opened eyes briefly to sternal rubs. Able to whisper "Rikia" after multiple attempts to state name.   LLE weakness with foot drop noted.  Able to raise BUE with perseveration.   Left lower extremity clonus Negative Hoffmann's  Skin: Skin is warm and dry.  Ecchymosis left temple Boil RUE  Psychiatric: She is agitated. Cognition and memory are impaired.  Sedated and arouses minimally    Results for orders placed or performed during the hospital encounter of 08/03/15 (from the past 24 hour(s))  Prepare RBC     Status: None   Collection Time: 08/19/15  9:30 AM  Result Value Ref Range   Order Confirmation ORDER PROCESSED BY BLOOD BANK   Type and screen [facility]     Status: None   Collection Time: 08/19/15 11:15 AM  Result Value Ref Range   ABO/RH(D) A NEG    Antibody Screen NEG    Sample Expiration 08/22/2015    Unit Number [id number]    Blood Component Type RED CELLS,LR    Unit division 00    Status of Unit ISSUED,FINAL    Transfusion Status OK TO TRANSFUSE    Crossmatch Result Compatible   ABO/Rh     Status: None   Collection Time: 08/19/15 11:15 AM  Result Value Ref  Range   ABO/RH(D) A NEG   Glucose, capillary     Status: Abnormal   Collection Time: 08/19/15 11:31 AM  Result Value Ref Range   Glucose-Capillary 120 (H) 65 - 99 mg/dL   Comment 1 Notify RN    Comment 2 Document in Chart   Glucose, capillary     Status: Abnormal   Collection Time: 08/19/15  2:52 PM  Result Value Ref Range   Glucose-Capillary 144 (H) 65 - 99 mg/dL   Comment 1 Notify RN    Comment 2 Document in Chart   Glucose, capillary     Status: None   Collection Time: 08/19/15  7:26 PM  Result Value Ref Range   Glucose-Capillary 96 65 - 99 mg/dL  Glucose, capillary     Status: Abnormal   Collection Time: 08/20/15 12:00 AM  Result Value Ref Range  Glucose-Capillary 109 (H) 65 - 99 mg/dL  Glucose, capillary     Status: None   Collection Time: 08/20/15  3:22 AM  Result Value Ref Range   Glucose-Capillary 97 65 - 99 mg/dL  Basic metabolic panel     Status: Abnormal   Collection Time: 08/20/15  5:45 AM  Result Value Ref Range   Sodium 139 135 - 145 mmol/L   Potassium 3.8 3.5 - 5.1 mmol/L   Chloride 105 101 - 111 mmol/L   CO2 25 22 - 32 mmol/L   Glucose, Bld 104 (H) 65 - 99 mg/dL   BUN 32 (H) 6 - 20 mg/dL   Creatinine, Ser 1.61 (H) 0.44 - 1.00 mg/dL   Calcium 8.3 (L) 8.9 - 10.3 mg/dL   GFR calc non Af Amer 34 (L) >60 mL/min   GFR calc Af Amer 40 (L) >60 mL/min   Anion gap 9 5 - 15  Magnesium     Status: None   Collection Time: 08/20/15  5:45 AM  Result Value Ref Range   Magnesium 2.2 1.7 - 2.4 mg/dL  Glucose, capillary     Status: None   Collection Time: 08/20/15  7:44 AM  Result Value Ref Range   Glucose-Capillary 90 65 - 99 mg/dL   Comment 1 Notify RN    Comment 2 Document in Chart    Dg Forearm Right  08/18/2015  CLINICAL DATA:  Right mid forearm swelling, possible abscess EXAM: RIGHT FOREARM - 2 VIEW COMPARISON:  None. FINDINGS: Two views of the right forearm submitted. No acute fracture or subluxation. Soft tissue swelling mid forearm. Cellulitis or dermatitis  cannot be excluded. Clinical correlation is necessary. IMPRESSION: No acute fracture or subluxation. No evidence of osteomyelitis. Soft tissue swelling mid forearm. Cellulitis or dermatitis cannot be excluded. Electronically Signed   By: Natasha Mead M.D.   On: 08/18/2015 18:36   Ct Head Wo Contrast  08/19/2015  CLINICAL DATA:  Subdural and subarachnoid hemorrhage improving but now with acute mental status change. EXAM: CT HEAD WITHOUT CONTRAST TECHNIQUE: Contiguous axial images were obtained from the base of the skull through the vertex without intravenous contrast. COMPARISON:  08/05/2015 FINDINGS: Examination demonstrates continued overall improvement with residual low-density right convexity subdural collection and no remaining acute subdural hemorrhage. Resolution of patchy previously seen by convexity acute subarachnoid blood. Improving bifrontal parenchymal hemorrhage with continued bifrontal edema. Suggestion of subtle midline shift to the left of 3 mm not seen previously. Ventricles and cisterns are otherwise within normal. Persistent moderate opacification of the right maxillary sinus with improved opacification of the left maxillary sinus. No other new changes. IMPRESSION: Continued overall improvement in patient's previously noted acute right subdural hematoma with residual low-density subdural collection. Near resolution of previously seen patchy biconvexity areas of acute subarachnoid hemorrhage and bifrontal parenchymal hemorrhage with residual edema over the bifrontal region. Subtle new midline shift to the left of 3 mm. These results will be called to the ordering clinician or representative by the Radiologist Assistant, and communication documented in the PACS or zVision Dashboard. Electronically Signed   By: Elberta Fortis M.D.   On: 08/19/2015 09:52   Dg Chest Port 1 View  08/19/2015  CLINICAL DATA:  Shortness of breath. EXAM: PORTABLE CHEST 1 VIEW COMPARISON:  08/14/2015 FINDINGS: Lungs are  adequately inflated with worsening left base opacification likely small to moderate effusion with associated atelectasis. Slight worsening minimal right basilar opacification likely small amount of pleural fluid/ atelectasis. Cannot completely exclude infection in  the lung bases. Mild prominence of the perihilar markings. Mild stable cardiomegaly. Remainder of the exam is unchanged. IMPRESSION: Worsening bibasilar opacification likely small to moderate left effusion and small right effusion with associated atelectasis. Cannot completely exclude infection in the lung bases. Mild stable cardiomegaly and possible minimal vascular congestion. Electronically Signed   By: Elberta Fortis M.D.   On: 08/19/2015 08:43    Assessment/Plan: Diagnosis:  TBI Labs and images independently reviewed.  Records reviewed and summated above.  [place] score:  IV  Speech to evaluate for Post traumatic amnesia and interval GOAT scores to assess progress.  NeuroPsych evaluation for behavorial assessment.  Provide environmental management by reducing the level of stimulation, tolerating restlessness when possible, protecting patient from harming self  or others and reducing patient's cognitive confusion.  Address behavioral concerns include providing structured environments and daily routines.  Cognitive therapy to direct modular abilities in order to maintain goals including problem solving, self regulation/monitoring, self management, attention, and memory.  Fall precautions; pt at risk for second impact syndrome  Prevention of secondary injury: monitor for hypotension, hypoxia, seizures or signs of increased ICP  Prophylactic AED  PT/OT consults for mobility strengthening, endurance training and adaptive ADLs   Consider pharmacological intervention if necessary with neurostimulants,  Such   as amantadine, methylphenidate, modafinil, etc.  Consider Propranolol for agitation and storming  Avoid medications that  could impair cognitive abilities, such as anticholinergics, antihistaminic, benzodiazapines, narcotics, etc when possible  1. Does the need for close, 24 hr/day medical supervision in concert with the patient's rehab needs make it unreasonable for this patient to be served in a less intensive setting? Yes 2. Co-Morbidities requiring supervision/potential complications:  Tachypnea (monitor RR with increased physical activity), CKD ( avoid nephrotoxic medications), ABLA (transfuse if necessary to ensure appropriate perfusion for increased activity tolerance), spinal stenosis ( avoid excessive extension based exercises), OA (ensure pain does not limit therapies), depression with anxiety (ensure anxiety and resulting apprehension do not limit functional progress; consider prn medications if warranted), systolic CHF (Monitor in accordance with increased physical activity and avoid UE resistance excercises), tachycardia (monitor in accordance with pain and increasing activity), chronic pain (ensure pain does not limit progression in therapies), pressure ulcer (low air loss mattress, turns q2) 3. Due to bladder management, bowel management, safety, skin/wound care, disease management, medication administration, pain management and patient education, does the patient require 24 hr/day rehab nursing? Yes 4. Does the patient require coordinated care of a physician, rehab nurse, PT (1-2 hrs/day, 5 days/week), OT (1-2 hrs/day, 5 days/week) and SLP (1-2 hrs/day, 5 days/week) to address physical and functional deficits in the context of the above medical diagnosis(es)? Yes Addressing deficits in the following areas: balance, endurance, locomotion, strength, transferring, bowel/bladder control, bathing, dressing, feeding, grooming, toileting, cognition, speech, language, swallowing and psychosocial support 5. Can the patient actively participate in an intensive therapy program of at least 3 hrs of therapy per day at least 5  days per week?  not at present 6. The potential for patient to make measurable gains while on inpatient rehab is excellent 7. Anticipated functional outcomes upon discharge from inpatient rehab are min assist  with PT, supervision and min assist with OT, modified independent and supervision with SLP. 8. Estimated rehab length of stay to reach the above functional goals is: 20-23 days. 9. Does the patient have adequate social supports and living environment to accommodate these discharge functional goals? Potentially 10. Anticipated D/C setting:  uncertain at present  11. Anticipated post D/C treatments: HH therapy and Home excercise program 12. Overall Rehab/Functional Prognosis: good and fair  RECOMMENDATIONS: This patient's condition is appropriate for continued rehabilitative care in the following setting: It is unclear at present if patient will have support at discharge. If the patient will not have support it is unlikely she will  be able to achieve a modified independent level of functioning after a short IRF stay  and therefore would recommend  SNF.  However, if the patient does have support att discharge, would recommend CIR  once acute medical issues have stabilized  and patient is able to tolerate 3 hours of therapies per day.. Patient has agreed to participate in recommended program. Potentially Note that insurance prior authorization may be required for reimbursement for recommended care.  Comment: Rehab Admissions Coordinator to follow up.  Maryla Morrow, MD 08/20/2015

## 2015-08-20 NOTE — Consult Note (Signed)
TEAM 1 - Stepdown/ICU TEAM CONSULT F/U NOTE  KHELANI KOPS ZOX:[id number] DOB: 1927-02-22 DOA: 08/03/2015 PCP: Rozanna Box, MD  Admit HPI / Brief Narrative: 81 y.o. F Hx Depression, Pelvis Fracture, Right Wrist Fracture, Spinal Stenosis, Bilateral foot fracture who suffered a fall while walking in a parking lot resulting in a SDH/SAH. Brought to [facility], head CT showed intracranial blood. No evidence of an operative lesion. She had a waxing and waning exam. Transferred to [facility] for further evaluation.   HPI/Subjective: The pt is withdrawn.  She will answer questions and interact but appears to prefer not to.  Her son is at the bedside and states that she perked up very nicely when she had a visitor from her church but then became withdrawn again as soon as they left.  He feels this is consistent with her previously diagnosed depression.  Recommendations/Plan:  SDH/SAH -Per Neurosurgery  Acute hypoxic respiratory failure 2nd to Aspiration pneumonia / Haemophilus influenzae -Completed 5 days antibiotics -Aspiration precautions -O2 sats stable on minimal O2 support - no evidence of ongoing respiratory distress  Wide complex tachycardia  -Resolved -Continue Amiodarone 200 mg daily -Continue Metoprolol 12.5 mg BID  -Cardiology signed off 11/19, will need follow up with them after dc  Chronic Systolic CHF -+~14L since admit, but no clinical evidence of significant volume overload  -weight hovering around 66kg - follow trend   Acute on chronic renal failure - baseline 1.4 -crt has returned to her baseline - follow trend   Hypokalemia -resolved w/ supplementation   Hypomagnesemia -Magnesium at goal    Dysphagia -Diet dysphagia 1 honey thick liquid per SLP recs   Anemia -Hgb holding steady around 8 - Fe studies c/w Fe deficiency / blood loss   R forearm superficial thrombophlebitis -no evidence on exam to suggest an actual abscess - son confirms this is a former IV  site - if remains bothersome Vasc Surgery eval for bedside I&D could be requested   Code Status: FULL Family Communication: spoke w/ son at bedside   Antibiotics: Unasyn 11/16 > 11/21  DVT prophylaxis: SCDs  Objective: Blood pressure 110/55, pulse 83, temperature 98.1 F (36.7 C), temperature source Oral, resp. rate 26, height  (1.6 m), weight 66.089 kg (145 lb 11.2 oz), SpO2 100 %.  Intake/Output Summary (Last 24 hours) at 08/20/15 1626 Last data filed at 08/20/15 1605  Gross per 24 hour  Intake    984 ml  Output   3450 ml  Net  -2466 ml   Exam: General: No acute respiratory distress Lungs: Clear to auscultation bilaterally without wheezes or crackles Cardiovascular: Regular rate and rhythm without murmur gallop or rub normal S1 and S2 Abdomen: Nontender, nondistended, soft, bowel sounds positive, no rebound, no ascites, no appreciable mass Extremities: No significant cyanosis, clubbing, or edema bilateral lower extremities  Data Reviewed: Basic Metabolic Panel:  Recent Labs Lab 08/16/15 0322 08/17/15 0425 08/18/15 0307 08/19/15 0330 08/20/15 0545  NA 142 143 143 142 139  K 3.8 3.4* 3.7 4.0 3.8  CL 107 111 115* 110 105  CO2 26 23 19* 21* 25  GLUCOSE 120* 131* 90 123* 104*  BUN 51* 45* 41* 32* 32*  CREATININE 1.39* 1.35* 1.21* 1.17* 1.36*  CALCIUM 8.5* 8.6* 8.5* 8.7* 8.3*  MG  --   --   --  1.6* 2.2    CBC:  Recent Labs Lab 08/15/15 0248 08/16/15 0322 08/17/15 0425 08/18/15 0307 08/19/15 0330  WBC 15.4* 16.9* 15.4*  11.9* 9.6  NEUTROABS  --   --   --  10.4* 8.1*  HGB 7.9* 7.8* 8.2* 8.4* 7.9*  HCT 25.2* 25.1* 26.6* 26.5* 25.4*  MCV 93.7 94.4 95.3 96.7 96.6  PLT 216 275 329 334 354    Liver Function Tests: No results for input(s): AST, ALT, ALKPHOS, BILITOT, PROT, ALBUMIN in the last 168 hours. No results for input(s): LIPASE, AMYLASE in the last 168 hours. No results for input(s): AMMONIA in the last 168 hours.  CBG:  Recent Labs Lab  08/19/15 1926 08/20/15 08/20/15 0322 08/20/15 0744 08/20/15 1251  GLUCAP 96 109* 97 90 183*    Studies:   Recent x-ray studies have been reviewed in detail by the Attending Physician  Scheduled Meds:  Scheduled Meds: . amiodarone  200 mg Oral Daily  . calcium-vitamin D  1 tablet Oral TID  . furosemide  40 mg Intravenous BID  . insulin aspart  0-9 Units Subcutaneous 6 times per day  . metoprolol tartrate  12.5 mg Oral BID WC  . multivitamin-lutein  1 capsule Oral BID  . RESOURCE THICKENUP CLEAR   Oral Once  . sodium chloride  10-40 mL Intracatheter Q12H    Time spent on care of this patient: 35 mins   Damontae Loppnow T , MD   [facility] Office  [phone] Pager - Text Page per Loretha StaplerAmion as per below:  On-Call/Text Page:      [email or web address]      password [email or web address]  If 7PM-7AM, please contact night-coverage [email or web address] Password [email or web address] 08/20/2015, 4:26 PM   LOS: 17 days

## 2015-08-21 ENCOUNTER — Inpatient Hospital Stay (HOSPITAL_COMMUNITY): Payer: Medicare Other

## 2015-08-21 DIAGNOSIS — L0291 Cutaneous abscess, unspecified: Secondary | ICD-10-CM

## 2015-08-21 LAB — CBC
HEMATOCRIT: 30.3 % — AB (ref 36.0–46.0)
HEMOGLOBIN: 10 g/dL — AB (ref 12.0–15.0)
MCH: 30.1 pg (ref 26.0–34.0)
MCHC: 33 g/dL (ref 30.0–36.0)
MCV: 91.3 fL (ref 78.0–100.0)
PLATELETS: 311 10*3/uL (ref 150–400)
RBC: 3.32 MIL/uL — ABNORMAL LOW (ref 3.87–5.11)
RDW: 17.4 % — AB (ref 11.5–15.5)
WBC: 10.2 10*3/uL (ref 4.0–10.5)

## 2015-08-21 LAB — GLUCOSE, CAPILLARY
GLUCOSE-CAPILLARY: 97 mg/dL (ref 65–99)
GLUCOSE-CAPILLARY: 98 mg/dL (ref 65–99)
GLUCOSE-CAPILLARY: 94 mg/dL (ref 65–99)
Glucose-Capillary: 99 mg/dL (ref 65–99)
Glucose-Capillary: 111 mg/dL — ABNORMAL HIGH (ref 65–99)
Glucose-Capillary: 122 mg/dL — ABNORMAL HIGH (ref 65–99)

## 2015-08-21 LAB — BASIC METABOLIC PANEL
ANION GAP: 10 (ref 5–15)
BUN: 32 mg/dL — AB (ref 6–20)
CALCIUM: 8.4 mg/dL — AB (ref 8.9–10.3)
CO2: 23 mmol/L (ref 22–32)
CREATININE: 1.41 mg/dL — AB (ref 0.44–1.00)
Chloride: 105 mmol/L (ref 101–111)
GFR calc non Af Amer: 33 mL/min — ABNORMAL LOW (ref >60)
GFR, EST AFRICAN AMERICAN: 38 mL/min — AB (ref >60)
Glucose, Bld: 95 mg/dL (ref 65–99)
POTASSIUM: 3.8 mmol/L (ref 3.5–5.1)
Sodium: 138 mmol/L (ref 135–145)

## 2015-08-21 LAB — MAGNESIUM: MAGNESIUM: 1.9 mg/dL (ref 1.7–2.4)

## 2015-08-21 MED ORDER — AMIODARONE HCL 200 MG PO TABS
100.0000 mg | ORAL_TABLET | Freq: Every day | ORAL | Status: DC
  Administered 2015-08-22: 100 mg via ORAL
  Filled 2015-08-21: qty 1

## 2015-08-21 MED ORDER — SODIUM CHLORIDE 0.9 % IV BOLUS (SEPSIS)
500.0000 mL | Freq: Once | INTRAVENOUS | Status: AC
  Administered 2015-08-21: 500 mL via INTRAVENOUS

## 2015-08-21 MED ORDER — WHITE PETROLATUM GEL
Status: AC
  Filled 2015-08-21: qty 1

## 2015-08-21 NOTE — Progress Notes (Signed)
Occupational Therapy Treatment Patient Details Name: Lisa SnellenFaye K Ridlon MRN: [id number] DOB: 09/19/1927 Today's Date: 08/21/2015    History of present illness pt presents after a fall in a parking lot sustaining Bil Frontal Cerebral Contusions.  pt initially evaluated on 11/15 then began to have Tachycardia and respiratory decline with Encephalopathy, Pulmonary Edema, and Cardiomyopathy.  pt intubated 11/16 - 11/23.  pt with hx of HTN, Bil "Foot" fxs, Pelvic fxs, R Wrist fx, Back surgeries, and Depression.     OT comments  Pt progressing. Continue to recommend CIR for rehab.  Follow Up Recommendations  CIR;Supervision/Assistance - 24 hour    Equipment Recommendations  3 in 1 bedside comode    Recommendations for Other Services Rehab consult    Precautions / Restrictions Precautions Precautions: Fall Restrictions Weight Bearing Restrictions: No       Mobility Bed Mobility Overal bed mobility: Needs Assistance;+2 for physical assistance Bed Mobility: Supine to Sit     Supine to sit: Max assist;+2 for physical assistance     General bed mobility comments: assist with legs and trunk and to scoot to edge of bed.   Transfers Overall transfer level: Needs assistance   Transfers: Sit to/from Stand;Stand Pivot Transfers Sit to Stand: +2 physical assistance;Max assist Stand pivot transfers: +2 physical assistance;Max assist       General transfer comment: assist to boost and to transfer to chair. Cues given for technique.    Balance Overall balance assessment: Needs assistance      Min-Mod assist for sitting balance sitting EOB                           ADL Overall ADL's : Needs assistance/impaired     Grooming: Brushing hair;Sitting;Applying deodorant;Wash/dry face;Minimal assistance   Upper Body Bathing: Set up;Supervision/ safety;Sitting Upper Body Bathing Details (indicate cue type and reason): did not wash complete UB       Upper Body Dressing Details  (indicate cue type and reason): OT assisted with gown     Toilet Transfer: +2 for physical assistance;Maximal assistance;Stand-pivot (from bed to chair)           Functional mobility during ADLs: +2 for physical assistance;Maximal assistance (stand pivot to chair) General ADL Comments: Pt required assist for balance sitting EOB during ADL task.      Vision                     Perception     Praxis      Cognition  Awake/Alert Behavior During Therapy: WFL for tasks assessed/performed Overall Cognitive Status: No family/caregiver present to determine baseline cognitive functioning Area of Impairment: Orientation;Memory;Problem solving;Following commands Orientation Level: Disoriented to;Place   Memory: Decreased short-term memory  Following Commands: Follows one step commands with increased time     Problem Solving: Decreased initiation;Slow processing;Requires verbal cues General Comments: pt also may have had some decreased attention-looking at therapist's badge    Extremity/Trunk Assessment               Exercises     Shoulder Instructions       General Comments      Pertinent Vitals/ Pain       Pain Assessment: No/denies pain; RR up to 37  Home Living  Prior Functioning/Environment              Frequency Min 2X/week     Progress Toward Goals  OT Goals(current goals can now be found in the care plan section)  Progress towards OT goals: Progressing toward goals  Acute Rehab OT Goals Patient Stated Goal: not stated OT Goal Formulation: With patient/family Time For Goal Achievement: 08/31/15 Potential to Achieve Goals: Good ADL Goals Pt Will Perform Eating: with set-up;with supervision;sitting Pt Will Perform Grooming: with set-up;with supervision;sitting Pt Will Perform Upper Body Bathing: with set-up;with supervision;sitting Pt Will Transfer to Toilet: with mod  assist;bedside commode Additional ADL Goal #1: Maintain sitting balance EOB with S x 10 min with S in preparation for ADL taks  Plan Discharge plan remains appropriate    Co-evaluation    PT/OT/SLP Co-Evaluation/Treatment: Yes Reason for Co-Treatment: For patient/therapist safety   OT goals addressed during session: ADL's and self-care;Other (comment) (mobility)      End of Session Equipment Utilized During Treatment: Gait belt   Activity Tolerance Patient tolerated treatment well   Patient Left in chair;with call bell/phone within reach;with chair alarm set   Nurse Communication Mobility status        Time: 1610-9604 OT Time Calculation (min): 28 min  Charges: OT General Charges $OT Visit: 1 Procedure OT Treatments $Self Care/Home Management : 8-22 mins   Earlie Raveling OTR/L [phone] 08/21/2015, 10:46 AM

## 2015-08-21 NOTE — Progress Notes (Signed)
Rehab admissions - I met with patient and her son.  Son says patient is confused today.  Patient lives alone, but has friends and family who can assist.  Son can also hire help if needed.  I would like to admit to acute inpatient rehab once patient is medically ready for rehab.  Call me for questions.  #[phone]

## 2015-08-21 NOTE — Progress Notes (Signed)
Speech Language Pathology Treatment: Dysphagia  Patient Details Name: Marita SnellenFaye K Plain MRN: [id number] DOB: 08/01/1927 Today's Date: 08/21/2015 Time: 1040-1055 SLP Time Calculation (min) (ACUTE ONLY): 15 min  Assessment / Plan / Recommendation Clinical Impression  During dysphagia treatment pt demonstrated an unimproved slow swallow function. Pt had oral holding of all consistencies, a weak immediate throat clear after initial cup sip of honey, and prolonged mastication and bolus formation of dysphagia 3 (mech soft). SLP provided moderate verbal cues for patient to chew and swallow. SLP recommends continued trials of upgraded consistencies and check for diet tolerance.   While the pt was more verbal today, she demonstrated very slow and delayed initiation of motor movement. SLP suspects pt has an acute decline in cognitive function as a result of her fall. Pt will likely benefit from a cognitive-linguistic evaluation and treatment.   HPI HPI: 81 year old female presents after a fall in a parking lot sustaining Bil Frontal Cerebral Contusions.Pt was initially evaluated 11/16 with recommendation for NPO, but then began to have tachycardia and respiratory decline with encephalopathy, pulmonary edema, and cardiomyopathy. she was intubated 11/16-11/23.  pt with hx of HTN, Bil "Foot" fxs, Pelvic fxs, R Wrist fx, Back surgeries, and Depression.       SLP Plan  Continue with current plan of care     Recommendations  Diet recommendations: Dysphagia 1 (puree);Honey-thick liquid Liquids provided via: Cup Medication Administration: Crushed with puree Supervision: Full supervision/cueing for compensatory strategies;Staff to assist with self feeding Compensations: Small sips/bites;Effortful swallow Postural Changes and/or Swallow Maneuvers: Seated upright 90 degrees              Oral Care Recommendations: Oral care QID Plan: Continue with current plan of care  Riccardo DubinKristen Chosen Garron, Student-SLP  Riccardo DubinKristen  Priseis Cratty 08/21/2015, 12:00 PM

## 2015-08-21 NOTE — Consult Note (Signed)
[facility] Medical Consultation  Lisa [name]:[id number] DOB: 09-Feb-1927 DOA: 08/03/2015 PCP: Rozanna Box, MD   Requesting physician: [facility] Date of consultation: 08/18/2015 Reason for consultation: Multiple medical problems  Impression/Recommendations Principal Problem:   Subdural hematoma (HCC) Active Problems:   Subarachnoid hemorrhage (HCC)   Acute respiratory failure with hypoxemia (HCC)   Hypernatremia   Pressure ulcer   Boil of upper extremity   Pneumonia due to Haemophilus influenzae (HCC)   Chronic systolic CHF (congestive heart failure) (HCC)   Acute on chronic renal failure (HCC)   Hypokalemia   Bright red blood per rectum   Dysphagia   Hypomagnesemia   SDH (subdural hematoma) (HCC)   Subdural hemorrhage, sub arachnoid hemorrhage > neuro function slowly improving -Per neurosurgery -PT/OT; recommends CIR -CIR evaluation pending -Out of bed to chair -Much more awake today, actually volunteered to participate in ambulating around ward but very weak.  -Not at baseline per son. 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 -Patient significantly improved but still not at baseline indicated by sons above statement  Acute hypoxic respiratory failure 2nd to Aspiration pneumonia/Haemophilus influenzae pneumonia -Completed 5 days antibiotics -Aspiration precautions -Monitor O2 saturation  Acute respiratory alkalosis -11/27 head CT; improved  Wide complex tachycardia/Hypotension  -Resolved -Decrease Amiodarone 100 mg daily; 81 year old woman relative hypotension -Continue Metoprolol 12.5 mg BID  -Cardiology sign off 11/19, will need follow up with them if she is discharged  Chronic Systolic CHF/Hypotension -See wide-complex tachycardia -Transfuse for involving<8 -11/27 transfused 1 unit PRBC -Strict I&O since admission +14.5 L -Daily weight;  Admission weight= 65.7 kg                  11/29 weight=66.2 kg -CVP -Discontinue diuretic  -Bolus normal saline  Acute on chronic renal failure - baseline 1.4 -At baseline continue to monitor  Hypokalemia -Potassium goal > 4    Hypomagnesemia -Magnesium goal> 2   Dysphagia -Diet dysphagia 1 honey thick liquid  Anemia F/u CBC  Check iron studies  Boil/right arm abscess? -Obtain soft tissue x-ray right forearm; nondiagnostic -Place K Pad; Apply to right forearm over boil. Will need to be I&D after comes to a head.  Goals of care -Patient stable for discharge to CIR   I will followup again tomorrow. Please contact me if I can be of assistance in the meanwhile. Thank you for this consultation.  Chief Complaint: SDH/SAH  HPI:  Lisa [name] is an 81 y.o. WF PMHx Depression, Pelvis Fracture, Right Wrist Fracture, Spinal Stenosis, Bilateral foot fracture.   She took fall while walking in a parking lot in an unorganized manner resulting in SDH/SAH. Brought to [facility], head ct showed intracranial blood. No evidence of an operative lesion. She had a waxing and waning exam. Transferred for further evaluation.     Review of symptoms The patient denies anorexia, fever, weight loss,, vision loss, decreased hearing, hoarseness, chest pain, syncope, dyspnea on exertion, peripheral edema, balance deficits, hemoptysis, abdominal pain, melena, hematochezia, severe indigestion/heartburn, hematuria, incontinence, genital sores, muscle weakness, suspicious skin lesions, transient blindness, difficulty walking, depression, unusual weight change, abnormal bleeding, enlarged lymph nodes, angioedema, and breast masses.   Consultants: Dr.Douglas B McQuaid PCCM Dr.[facility]   Procedure/Significant Events: 11/13 head CT >> stable BILATERAL frontal intracerebral hematomas, surrounding edema is more prominent  11/16 Echo >> EF 15%, RVSP 65 11/23 Echo >> EF  25%  SIGNIFICANT EVENTS: 11/11 fall with sah/sdh 11/16 Wide complex tachycardia, cardioversion x 2 11/16 intubated 11/17 runs of tachy on amio 11/23 echocardiogram;- Left ventricle: Moderate basal septal hypertrophy. Septal apical and distal anterior wall akinesis Inferior wall hypokinesis -LVEF=25%. 11/25 passed modified barium swallow test 11/26 right forearm x-rays; negative osteomyelitis, positive soft tissue swelling; cellulitis/dermatitis 11/27 PCXR; Worsening bibasilar opacification likely small to moderate left effusion and small right effusion with associated atelectasis. -Mild stable cardiomegaly and possible minimal vascular congestion. 11/27 transfused 1 unit PRBC    Culture 11/11 MRSA by PCR negative 11/16 tracheal aspirate positive Haemophilus influenza   Antibiotics: Unasyn 11/16>> 11/21    DVT prophylaxis; SCD  Lines ETT 11/16>> 11/23    Past Medical History  Diagnosis Date  . Allergy   . Pelvis fracture (HCC)   . Wrist fracture, right   . Depression   . Spinal stenosis   . Osteoarthritis   . Hypertension   . Foot fracture     bilateral   Past Surgical History  Procedure Laterality Date  . Back surgery N/A   . Breast lumpectomy N/A    Social History:  reports that she has never smoked. She has never used smokeless tobacco. She reports that she does not drink alcohol or use illicit drugs.  Allergies  Allergen Reactions  . Ambien [Zolpidem Tartrate] Other (See Comments)    Reaction:  Hallucinations   . Oxycodone Nausea And Vomiting  . Shellfish Allergy Swelling and Other (See Comments)    Pts mouth and face swells.   . Ace Inhibitors Cough  . Daypro [Oxaprozin] Other (See Comments)    GI upset  . Norvasc [Amlodipine Besylate] Palpitations   Family History  Problem Relation Age of Onset  . COPD Mother   . COPD Father   . Heart disease Father     Prior to Admission medications   Medication Sig Start Date End Date Taking?  Authorizing Provider  atenolol (TENORMIN) 50 MG tablet Take 50 mg by mouth daily.    Yes Historical Provider, MD  citalopram (CELEXA) 40 MG tablet Take 40 mg by mouth daily.   Yes Historical Provider, MD  HYDROcodone-acetaminophen (NORCO/VICODIN) 5-325 MG tablet Take 1 tablet by mouth 4 (four) times daily as needed for moderate pain.   Yes Historical Provider, MD  irbesartan (AVAPRO) 300 MG tablet Take 150 mg by mouth daily.    Yes Historical Provider, MD  aspirin EC 81 MG tablet Take 81 mg by mouth daily.    Historical Provider, MD  calcium-vitamin D (OSCAL WITH D) 500-200 MG-UNIT tablet Take 1 tablet by mouth 3 (three) times daily.    Historical Provider, MD  cholecalciferol (VITAMIN D) 1000 UNITS tablet Take 1,000 Units by mouth daily.    Historical Provider, MD  Multiple Vitamins-Minerals (ICAPS AREDS 2) CAPS Take 1 capsule by mouth 2 (two) times daily.     Historical Provider, MD   Physical Exam: Blood pressure 139/63, pulse 94, temperature 97.4 F (36.3 C), temperature source Oral, resp. rate 21, height 5\' 3"  (1.6 m), weight 66.2 kg (145 lb 15.1 oz), SpO2 100 %. Filed Vitals:   08/20/15 2300 08/21/15 0242 08/21/15 0500 08/21/15 0747  BP:  140/92  139/63  Pulse:  92  94  Temp: 97.5 F (36.4 C) 97.4 F (36.3 C)  97.4 F (36.3 C)  TempSrc: Axillary Axillary  Oral  Resp:  20  21  Height:      Weight:   66.2 kg (145  lb 15.1 oz)   SpO2:  97%  100%     General: A/O 3, (does not know why), is very weak, negative acute respiratory distress Eyes: Negative headache, eye pain, negative scleral hemorrhage ENT: Negative Runny nose, negative ear pain, negative gingival bleeding, Neck:  Negative scars, masses, torticollis, lymphadenopathy, JVD Lungs: Clear to auscultation bilaterally without wheezes or crackles Cardiovascular: Regular rhythm and rate without murmur gallop or rub normal S1 and S2 Abdomen:negative abdominal pain, nondistended, positive soft, bowel sounds, no rebound, no  ascites, no appreciable mass Extremities: No significant cyanosis, clubbing, or edema bilateral lower extremities. Right forearm boil/abscess positive erythema~2.5 inch in diameter (stable overnight), negative warmth to touch, negative pain to palpation Psychiatric: Unable to fully assess Neurologic:  Cranial nerves II through XII intact, tongue/uvula midline, all extremities muscle strength 5/5, sensation intact throughout, finger to nose to finger WNL bilateral, negative dysarthria, negative expressive aphasia, negative receptive aphasia.   Labs on Admission:  Basic Metabolic Panel:  Recent Labs Lab 08/17/15 0425 08/18/15 0307 08/19/15 0330 08/20/15 0545 08/21/15 0424  NA 143 143 142 139 138  K 3.4* 3.7 4.0 3.8 3.8  CL 111 115* 110 105 105  CO2 23 19* 21* 25 23  GLUCOSE 131* 90 123* 104* 95  BUN 45* 41* 32* 32* 32*  CREATININE 1.35* 1.21* 1.17* 1.36* 1.41*  CALCIUM 8.6* 8.5* 8.7* 8.3* 8.4*  MG  --   --  1.6* 2.2 1.9   Liver Function Tests: No results for input(s): AST, ALT, ALKPHOS, BILITOT, PROT, ALBUMIN in the last 168 hours. No results for input(s): LIPASE, AMYLASE in the last 168 hours. No results for input(s): AMMONIA in the last 168 hours. CBC:  Recent Labs Lab 08/16/15 0322 08/17/15 0425 08/18/15 0307 08/19/15 0330 08/21/15 0424  WBC 16.9* 15.4* 11.9* 9.6 10.2  NEUTROABS  --   --  10.4* 8.1*  --   HGB 7.8* 8.2* 8.4* 7.9* 10.0*  HCT 25.1* 26.6* 26.5* 25.4* 30.3*  MCV 94.4 95.3 96.7 96.6 91.3  PLT 275 329 334 354 311   Cardiac Enzymes: No results for input(s): CKTOTAL, CKMB, CKMBINDEX, TROPONINI in the last 168 hours. BNP: Invalid input(s): POCBNP CBG:  Recent Labs Lab 08/20/15 1251 08/20/15 1532 08/20/15 1903 08/20/15 2258 08/21/15 0301  GLUCAP 183* 213* 103* 151* 99    Radiological Exams on Admission: No results found.  EKG:   Care during the described time interval was provided by me .  I have reviewed this patient's available data,  including medical history, events of note, physical examination, and all test results as part of my evaluation. I have personally reviewed and interpreted all radiology studies.  Time spent: 40 minutes  Drema Dallas [facility] Pager [phone]  If 7PM-7AM, please contact night-coverage [email or web address] Password [facility] 08/21/2015, 12:04 PM

## 2015-08-21 NOTE — Progress Notes (Signed)
Physical Therapy Treatment Patient Details Name: Lisa [name] MRN: [id number] DOB: 10/01/1926 Today's Date: 08/21/2015    History of Present Illness pt presents after a fall in a parking lot sustaining Bil Frontal Cerebral Contusions.  pt initially evaluated on 11/15 then began to have Tachycardia and respiratory decline with Encephalopathy, Pulmonary Edema, and Cardiomyopathy.  pt intubated 11/16 - 11/23.  pt with hx of HTN, Bil "Foot" fxs, Pelvic fxs, R Wrist fx, Back surgeries, and Depression.      PT Comments    Pt demonstrated modest progress, participating in stand pivot transfer but requiring max +2 assist.  Therapeutic exercises complete sitting in recliner chair.  Pt will benefit from continued skilled PT services to increase functional independence and safety.  Follow Up Recommendations  CIR     Equipment Recommendations  None recommended by PT    Recommendations for Other Services       Precautions / Restrictions Precautions Precautions: Fall Restrictions Weight Bearing Restrictions: No    Mobility  Bed Mobility Overal bed mobility: Needs Assistance;+2 for physical assistance Bed Mobility: Supine to Sit     Supine to sit: Max assist;+2 for physical assistance     General bed mobility comments: Pt demonstrates difficulty initiating movement but once provided tactile cues pt participates in coming EOB.  A to trunk and Bil LEs and use of bed pad   Transfers Overall transfer level: Needs assistance Equipment used: 2 person hand held assist Transfers: Sit to/from [employer]GI CorporationStand;Stand Pivot Transfers Sit to Stand: +2 physical assistance;Max assist Stand pivot transfers: +2 physical assistance;Max assist       General transfer comment: A to boost to standing, pt shuffles feet minimally during pivot but participates.  Bil knees blocked.  Pt maintains Bil knee extension rather than activiating muscles.  Ambulation/Gait                 Stairs             Wheelchair Mobility    Modified Rankin (Stroke Patients Only)       Balance Overall balance assessment: Needs assistance Sitting-balance support: Bilateral upper extremity supported;Feet supported Sitting balance-Leahy Scale: Poor Sitting balance - Comments: pt leans posteriorly and to Lt side  Postural control: Left lateral lean Standing balance support: Bilateral upper extremity supported;During functional activity Standing balance-Leahy Scale: Zero                      Cognition Arousal/Alertness: Awake/alert Behavior During Therapy: Flat affect Overall Cognitive Status: No family/caregiver present to determine baseline cognitive functioning Area of Impairment: Orientation;Attention;Memory;Following commands;Safety/judgement;Awareness;Problem solving Orientation Level: Disoriented to;Place [facility] [facility] Current Attention Level: Sustained Memory: Decreased short-term memory Following Commands: Follows one step commands with increased time     Problem Solving: Slow processing;Decreased initiation;Difficulty sequencing;Requires verbal cues;Requires tactile cues General Comments: Difficult to assess cognition due to minimal verbalizations, but pt does nod head yes/no appropriately and follows simple directions with delay.      Exercises General Exercises - Lower Extremity Ankle Circles/Pumps: AROM;Both;10 reps;Seated Long Arc Quad: AROM;Both;10 reps;Seated Hip Flexion/Marching: AROM;Both;10 reps;Seated    General Comments        Pertinent Vitals/Pain Pain Assessment: No/denies pain Faces Pain Scale: No hurt    Home Living                      Prior Function            PT Goals (current goals  can now be found in the care plan section) Acute Rehab PT Goals Patient Stated Goal: not stated PT Goal Formulation: With family Time For Goal Achievement: 08/29/15 Potential to Achieve Goals: Good Progress towards PT goals:  Progressing toward goals    Frequency  Min 3X/week    PT Plan Current plan remains appropriate    Co-evaluation PT/OT/SLP Co-Evaluation/Treatment: Yes Reason for Co-Treatment: For patient/therapist safety   OT goals addressed during session: ADL's and self-care;Other (comment) (mobility)     End of Session Equipment Utilized During Treatment: Gait belt Activity Tolerance: Patient limited by fatigue Patient left: in chair;with call bell/phone within reach;with chair alarm set (w/ OT)     Time: 5366-4403 PT Time Calculation (min) (ACUTE ONLY): 24 min  Charges:  $Therapeutic Activity: 8-22 mins                    G Codes:      Michail Jewels PT, [place] [phone] Pager: [phone] 08/21/2015, 1:42 PM

## 2015-08-21 NOTE — Progress Notes (Signed)
Pt had not voided since having foley removed at 1600 hrs on Monday,  Bladder scanned @0100  167 cc and then again at 0530 270'cc Paged K Schorr who  Ordered in and out cath performed per protocol with RN and NA witness returned 350 of amber urine pt tolerated well.

## 2015-08-21 NOTE — Discharge Summary (Signed)
  Physician Discharge Summary  Patient ID: Lisa SnellenFaye K [name] MRN: [id number] DOB/AGE: 79/05/1927 79 y.o.  Admit date: 08/03/2015 Discharge date: 08/21/2015  Admission Diagnoses:Intracerebral hematoma   Discharge Diagnoses:  Principal Problem:   Subdural hematoma (HCC) Active Problems:   Subarachnoid hemorrhage (HCC)   Acute respiratory failure with hypoxemia (HCC)   Hypernatremia   Pressure ulcer   Boil of upper extremity   Pneumonia due to Haemophilus influenzae (HCC)   Chronic systolic CHF (congestive heart failure) (HCC)   Acute on chronic renal failure (HCC)   Hypokalemia   Bright red blood per rectum   Dysphagia   Hypomagnesemia   SDH (subdural hematoma) (HCC)   Discharged Condition: fair  Hospital Course: Lisa [name] was admitted after falling and striking her head. She was alert on transfer, but confused. She was moving all extremities upon admission and would follow some commands. Her mental status declined over the next few days, but this coincided with evolution and enlargement of the hematomas located bilaterally in the frontal lobes. At no time was surgical decompression of the cranial contents indicated. She appeared to be slowly improving then had a cardiorespiratory setback. Lisa [name] developed a wide complex tachycardic rhythm requiring shock x2 and an amiodarone injection to break the arrhythmia. After evaluation by cardiology this was felt to be a Takatsubo episode. She was intubated emergently and was stable on the ventilator. Slowly her neurologic exam improved and she was weaned from the ventilator. Lisa [name] did undergo an echocardiogram which revealed 15-20% ejection fraction. While there was discussion to withdraw treatment, with the neurologic improvement the question of support was moot. She was extubated and transferred to stepdown. At this time Rehab would like to transfer her to inpatient rehab. Lisa [name] was also treated for pneumonia with Haemophilius , 5 day  treatment with ABx   Treatments: as above  Discharge Exam: Blood pressure 139/63, pulse 94, temperature 97 F (36.1 C), temperature source Axillary, resp. rate 21, height 5\' 3"  (1.6 m), weight 66.2 kg (145 lb 15.1 oz), SpO2 100 %. General appearance: alert, cachectic, mild distress and slowed mentation Neurologic: Mental status: alertness: lethargic, orientation: person, affect: blunted, following commands Cranial nerves: normal Motor: moving all extremities  Disposition: 02-Short Term Hospital * No surgery found *    Medication List    ASK your doctor about these medications        aspirin EC 81 MG tablet  Take 81 mg by mouth daily.     atenolol 50 MG tablet  Commonly known as:  TENORMIN  Take 50 mg by mouth daily.     calcium-vitamin D 500-200 MG-UNIT tablet  Commonly known as:  OSCAL WITH D  Take 1 tablet by mouth 3 (three) times daily.     cholecalciferol 1000 UNITS tablet  Commonly known as:  VITAMIN D  Take 1,000 Units by mouth daily.     citalopram 40 MG tablet  Commonly known as:  CELEXA  Take 40 mg by mouth daily.     HYDROcodone-acetaminophen 5-325 MG tablet  Commonly known as:  NORCO/VICODIN  Take 1 tablet by mouth 4 (four) times daily as needed for moderate pain.     ICAPS AREDS 2 Caps  Take 1 capsule by mouth 2 (two) times daily.     irbesartan 300 MG tablet  Commonly known as:  AVAPRO  Take 150 mg by mouth daily.         Signed: Keone Kamer [name] 08/21/2015, 4:25 PM

## 2015-08-22 ENCOUNTER — Inpatient Hospital Stay (HOSPITAL_COMMUNITY)
Admission: AD | Admit: 2015-08-22 | Discharge: 2015-09-12 | DRG: 949 | Disposition: A | Discharge status: Skilled Nursing Facility | Payer: Medicare Other | Source: Intra-hospital | Attending: Physical Medicine & Rehabilitation | Admitting: Physical Medicine & Rehabilitation

## 2015-08-22 ENCOUNTER — Encounter (HOSPITAL_COMMUNITY): Payer: Self-pay | Admitting: *Deleted

## 2015-08-22 DIAGNOSIS — N179 Acute kidney failure, unspecified: Secondary | ICD-10-CM

## 2015-08-22 DIAGNOSIS — S065X0D Traumatic subdural hemorrhage without loss of consciousness, subsequent encounter: Secondary | ICD-10-CM | POA: Diagnosis present

## 2015-08-22 DIAGNOSIS — I5022 Chronic systolic (congestive) heart failure: Secondary | ICD-10-CM

## 2015-08-22 DIAGNOSIS — L03113 Cellulitis of right upper limb: Secondary | ICD-10-CM | POA: Diagnosis not present

## 2015-08-22 DIAGNOSIS — F419 Anxiety disorder, unspecified: Secondary | ICD-10-CM

## 2015-08-22 DIAGNOSIS — D509 Iron deficiency anemia, unspecified: Secondary | ICD-10-CM

## 2015-08-22 DIAGNOSIS — R32 Unspecified urinary incontinence: Secondary | ICD-10-CM | POA: Diagnosis not present

## 2015-08-22 DIAGNOSIS — S06361A Traumatic hemorrhage of cerebrum, unspecified, with loss of consciousness of 30 minutes or less, initial encounter: Secondary | ICD-10-CM

## 2015-08-22 DIAGNOSIS — F329 Major depressive disorder, single episode, unspecified: Secondary | ICD-10-CM

## 2015-08-22 DIAGNOSIS — Z7982 Long term (current) use of aspirin: Secondary | ICD-10-CM

## 2015-08-22 DIAGNOSIS — R410 Disorientation, unspecified: Secondary | ICD-10-CM

## 2015-08-22 DIAGNOSIS — N189 Chronic kidney disease, unspecified: Secondary | ICD-10-CM

## 2015-08-22 DIAGNOSIS — I82402 Acute embolism and thrombosis of unspecified deep veins of left lower extremity: Secondary | ICD-10-CM

## 2015-08-22 DIAGNOSIS — R1310 Dysphagia, unspecified: Secondary | ICD-10-CM | POA: Diagnosis not present

## 2015-08-22 DIAGNOSIS — A499 Bacterial infection, unspecified: Secondary | ICD-10-CM | POA: Diagnosis present

## 2015-08-22 DIAGNOSIS — S069X9A Unspecified intracranial injury with loss of consciousness of unspecified duration, initial encounter: Secondary | ICD-10-CM | POA: Diagnosis present

## 2015-08-22 DIAGNOSIS — R7989 Other specified abnormal findings of blood chemistry: Secondary | ICD-10-CM | POA: Diagnosis not present

## 2015-08-22 DIAGNOSIS — E871 Hypo-osmolality and hyponatremia: Secondary | ICD-10-CM | POA: Diagnosis not present

## 2015-08-22 DIAGNOSIS — I82401 Acute embolism and thrombosis of unspecified deep veins of right lower extremity: Secondary | ICD-10-CM | POA: Diagnosis not present

## 2015-08-22 DIAGNOSIS — N390 Urinary tract infection, site not specified: Secondary | ICD-10-CM | POA: Diagnosis not present

## 2015-08-22 DIAGNOSIS — M961 Postlaminectomy syndrome, not elsewhere classified: Secondary | ICD-10-CM | POA: Diagnosis not present

## 2015-08-22 DIAGNOSIS — I130 Hypertensive heart and chronic kidney disease with heart failure and stage 1 through stage 4 chronic kidney disease, or unspecified chronic kidney disease: Secondary | ICD-10-CM | POA: Diagnosis not present

## 2015-08-22 DIAGNOSIS — E876 Hypokalemia: Secondary | ICD-10-CM | POA: Diagnosis not present

## 2015-08-22 DIAGNOSIS — S069X1S Unspecified intracranial injury with loss of consciousness of 30 minutes or less, sequela: Secondary | ICD-10-CM

## 2015-08-22 DIAGNOSIS — R339 Retention of urine, unspecified: Secondary | ICD-10-CM | POA: Diagnosis not present

## 2015-08-22 DIAGNOSIS — Y92481 Parking lot as the place of occurrence of the external cause: Secondary | ICD-10-CM

## 2015-08-22 DIAGNOSIS — S069X3S Unspecified intracranial injury with loss of consciousness of 1 hour to 5 hours 59 minutes, sequela: Secondary | ICD-10-CM | POA: Diagnosis not present

## 2015-08-22 DIAGNOSIS — B9620 Unspecified Escherichia coli [E. coli] as the cause of diseases classified elsewhere: Secondary | ICD-10-CM

## 2015-08-22 DIAGNOSIS — I82409 Acute embolism and thrombosis of unspecified deep veins of unspecified lower extremity: Secondary | ICD-10-CM | POA: Diagnosis present

## 2015-08-22 DIAGNOSIS — R52 Pain, unspecified: Secondary | ICD-10-CM

## 2015-08-22 DIAGNOSIS — M4806 Spinal stenosis, lumbar region: Secondary | ICD-10-CM | POA: Diagnosis not present

## 2015-08-22 DIAGNOSIS — M48062 Spinal stenosis, lumbar region with neurogenic claudication: Secondary | ICD-10-CM | POA: Diagnosis present

## 2015-08-22 DIAGNOSIS — S069XAA Unspecified intracranial injury with loss of consciousness status unknown, initial encounter: Secondary | ICD-10-CM | POA: Diagnosis present

## 2015-08-22 DIAGNOSIS — S066X0D Traumatic subarachnoid hemorrhage without loss of consciousness, subsequent encounter: Secondary | ICD-10-CM

## 2015-08-22 DIAGNOSIS — Z79899 Other long term (current) drug therapy: Secondary | ICD-10-CM | POA: Diagnosis not present

## 2015-08-22 DIAGNOSIS — G4700 Insomnia, unspecified: Secondary | ICD-10-CM

## 2015-08-22 DIAGNOSIS — I82403 Acute embolism and thrombosis of unspecified deep veins of lower extremity, bilateral: Secondary | ICD-10-CM | POA: Diagnosis not present

## 2015-08-22 DIAGNOSIS — D62 Acute posthemorrhagic anemia: Secondary | ICD-10-CM | POA: Diagnosis not present

## 2015-08-22 DIAGNOSIS — S069X4S Unspecified intracranial injury with loss of consciousness of 6 hours to 24 hours, sequela: Secondary | ICD-10-CM | POA: Diagnosis not present

## 2015-08-22 DIAGNOSIS — F32A Depression, unspecified: Secondary | ICD-10-CM | POA: Diagnosis present

## 2015-08-22 DIAGNOSIS — K5900 Constipation, unspecified: Secondary | ICD-10-CM | POA: Diagnosis not present

## 2015-08-22 DIAGNOSIS — I472 Ventricular tachycardia: Secondary | ICD-10-CM

## 2015-08-22 DIAGNOSIS — R4189 Other symptoms and signs involving cognitive functions and awareness: Secondary | ICD-10-CM | POA: Diagnosis not present

## 2015-08-22 DIAGNOSIS — F409 Phobic anxiety disorder, unspecified: Secondary | ICD-10-CM | POA: Diagnosis present

## 2015-08-22 DIAGNOSIS — Y848 Other medical procedures as the cause of abnormal reaction of the patient, or of later complication, without mention of misadventure at the time of the procedure: Secondary | ICD-10-CM | POA: Diagnosis not present

## 2015-08-22 DIAGNOSIS — R269 Unspecified abnormalities of gait and mobility: Secondary | ICD-10-CM

## 2015-08-22 DIAGNOSIS — F5105 Insomnia due to other mental disorder: Secondary | ICD-10-CM

## 2015-08-22 DIAGNOSIS — I82413 Acute embolism and thrombosis of femoral vein, bilateral: Secondary | ICD-10-CM | POA: Diagnosis not present

## 2015-08-22 DIAGNOSIS — S06360D Traumatic hemorrhage of cerebrum, unspecified, without loss of consciousness, subsequent encounter: Secondary | ICD-10-CM

## 2015-08-22 DIAGNOSIS — T80218A Other infection due to central venous catheter, initial encounter: Secondary | ICD-10-CM | POA: Diagnosis not present

## 2015-08-22 DIAGNOSIS — W19XXXD Unspecified fall, subsequent encounter: Secondary | ICD-10-CM | POA: Diagnosis not present

## 2015-08-22 DIAGNOSIS — I824Z1 Acute embolism and thrombosis of unspecified deep veins of right distal lower extremity: Secondary | ICD-10-CM

## 2015-08-22 DIAGNOSIS — I619 Nontraumatic intracerebral hemorrhage, unspecified: Secondary | ICD-10-CM | POA: Insufficient documentation

## 2015-08-22 LAB — URINALYSIS, ROUTINE W REFLEX MICROSCOPIC
Bilirubin Urine: NEGATIVE
GLUCOSE, UA: NEGATIVE mg/dL
HGB URINE DIPSTICK: NEGATIVE
Ketones, ur: NEGATIVE mg/dL
Nitrite: POSITIVE — AB
PH: 5 (ref 5.0–8.0)
PROTEIN: NEGATIVE mg/dL
SPECIFIC GRAVITY, URINE: 1.024 (ref 1.005–1.030)

## 2015-08-22 LAB — GLUCOSE, CAPILLARY
GLUCOSE-CAPILLARY: 88 mg/dL (ref 65–99)
GLUCOSE-CAPILLARY: 79 mg/dL (ref 65–99)
GLUCOSE-CAPILLARY: 115 mg/dL — AB (ref 65–99)
Glucose-Capillary: 126 mg/dL — ABNORMAL HIGH (ref 65–99)
Glucose-Capillary: 127 mg/dL — ABNORMAL HIGH (ref 65–99)

## 2015-08-22 LAB — URINE MICROSCOPIC-ADD ON

## 2015-08-22 MED ORDER — CITALOPRAM HYDROBROMIDE 20 MG PO TABS
20.0000 mg | ORAL_TABLET | Freq: Every day | ORAL | Status: DC
  Administered 2015-08-23 – 2015-09-03 (×12): 20 mg via ORAL
  Filled 2015-08-22 (×13): qty 1

## 2015-08-22 MED ORDER — DIPHENHYDRAMINE HCL 12.5 MG/5ML PO ELIX: 12.5000 mg | ORAL_SOLUTION | Freq: Four times a day (QID) | ORAL | Status: DC | PRN

## 2015-08-22 MED ORDER — BISACODYL 10 MG RE SUPP: 10.0000 mg | Freq: Every day | RECTAL | Status: DC | PRN

## 2015-08-22 MED ORDER — ACETAMINOPHEN 325 MG PO TABS
325.0000 mg | ORAL_TABLET | ORAL | Status: DC | PRN
  Administered 2015-08-25 – 2015-08-26 (×3): 650 mg via ORAL
  Filled 2015-08-22 (×5): qty 2

## 2015-08-22 MED ORDER — FLEET ENEMA 7-19 GM/118ML RE ENEM
1.0000 | ENEMA | Freq: Once | RECTAL | Status: AC | PRN
  Administered 2015-09-12: 1 via RECTAL
  Filled 2015-08-22: qty 1

## 2015-08-22 MED ORDER — TRAZODONE HCL 50 MG PO TABS
25.0000 mg | ORAL_TABLET | Freq: Every evening | ORAL | Status: DC | PRN
  Administered 2015-08-23: 25 mg via ORAL
  Administered 2015-08-26 – 2015-08-28 (×2): 50 mg via ORAL
  Filled 2015-08-22 (×3): qty 1

## 2015-08-22 MED ORDER — METOPROLOL TARTRATE 25 MG PO TABS: 12.5000 mg | ORAL_TABLET | Freq: Two times a day (BID) | ORAL | Status: DC

## 2015-08-22 MED ORDER — AMIODARONE HCL 100 MG PO TABS
100.0000 mg | ORAL_TABLET | Freq: Every day | ORAL | Status: DC
  Administered 2015-08-23 – 2015-09-05 (×14): 100 mg via ORAL
  Filled 2015-08-22 (×15): qty 1

## 2015-08-22 MED ORDER — AMIODARONE HCL 100 MG PO TABS: 100.0000 mg | ORAL_TABLET | Freq: Every day | ORAL | Status: DC

## 2015-08-22 MED ORDER — SENNOSIDES-DOCUSATE SODIUM 8.6-50 MG PO TABS
1.0000 | ORAL_TABLET | Freq: Every evening | ORAL | Status: DC | PRN
Start: 2015-08-22 — End: 2015-08-30

## 2015-08-22 MED ORDER — OCUVITE-LUTEIN PO CAPS
1.0000 | ORAL_CAPSULE | Freq: Two times a day (BID) | ORAL | Status: DC
  Administered 2015-08-22 – 2015-09-05 (×28): 1 via ORAL
  Filled 2015-08-22 (×35): qty 1

## 2015-08-22 MED ORDER — DOXYCYCLINE HYCLATE 50 MG PO CAPS: 50.0000 mg | ORAL_CAPSULE | Freq: Two times a day (BID) | ORAL | Status: DC

## 2015-08-22 MED ORDER — METOPROLOL TARTRATE 12.5 MG HALF TABLET
12.5000 mg | ORAL_TABLET | Freq: Two times a day (BID) | ORAL | Status: DC
  Administered 2015-08-22 – 2015-09-12 (×40): 12.5 mg via ORAL
  Filled 2015-08-22 (×41): qty 1

## 2015-08-22 MED ORDER — FUROSEMIDE 20 MG PO TABS: 20.0000 mg | ORAL_TABLET | Freq: Every day | ORAL | Status: DC

## 2015-08-22 MED ORDER — INSULIN ASPART 100 UNIT/ML ~~LOC~~ SOLN
0.0000 [IU] | Freq: Three times a day (TID) | SUBCUTANEOUS | Status: DC
  Administered 2015-08-23 – 2015-08-26 (×2): 1 [IU] via SUBCUTANEOUS
  Administered 2015-08-28: 2 [IU] via SUBCUTANEOUS
  Administered 2015-08-29: 1 [IU] via SUBCUTANEOUS

## 2015-08-22 MED ORDER — CALCIUM CARBONATE-VITAMIN D 500-200 MG-UNIT PO TABS
1.0000 | ORAL_TABLET | Freq: Three times a day (TID) | ORAL | Status: DC
  Administered 2015-08-22 – 2015-09-05 (×43): 1 via ORAL
  Filled 2015-08-22 (×42): qty 1

## 2015-08-22 MED ORDER — SODIUM CHLORIDE 0.9 % IV SOLN
INTRAVENOUS | Status: DC
  Filled 2015-08-22: qty 1000

## 2015-08-22 MED ORDER — SODIUM CHLORIDE 0.9 % IV SOLN
INTRAVENOUS | Status: DC
  Administered 2015-08-24: 07:00:00 via INTRAVENOUS

## 2015-08-22 MED ORDER — RESOURCE THICKENUP CLEAR PO POWD
Freq: Once | ORAL | Status: DC
  Filled 2015-08-22: qty 125

## 2015-08-22 MED ORDER — GUAIFENESIN-DM 100-10 MG/5ML PO SYRP: 5.0000 mL | ORAL_SOLUTION | Freq: Four times a day (QID) | ORAL | Status: DC | PRN

## 2015-08-22 MED ORDER — HYDROCODONE-ACETAMINOPHEN 5-325 MG PO TABS
1.0000 | ORAL_TABLET | Freq: Two times a day (BID) | ORAL | Status: DC | PRN
  Administered 2015-08-22: 1 via ORAL
  Filled 2015-08-22: qty 1

## 2015-08-22 MED ORDER — METHOCARBAMOL 500 MG PO TABS
500.0000 mg | ORAL_TABLET | Freq: Four times a day (QID) | ORAL | Status: DC | PRN
  Administered 2015-08-26 – 2015-09-04 (×5): 500 mg via ORAL
  Filled 2015-08-22 (×5): qty 1

## 2015-08-22 MED ORDER — LIDOCAINE HCL 2 % EX GEL: CUTANEOUS | Status: DC | PRN

## 2015-08-22 MED ORDER — CITALOPRAM HYDROBROMIDE 20 MG PO TABS: 20.0000 mg | ORAL_TABLET | Freq: Every day | ORAL | Status: DC

## 2015-08-22 MED ORDER — ALUM & MAG HYDROXIDE-SIMETH 200-200-20 MG/5ML PO SUSP
30.0000 mL | ORAL | Status: DC | PRN
  Administered 2015-08-26 – 2015-09-10 (×2): 30 mL via ORAL
  Filled 2015-08-22 (×2): qty 30

## 2015-08-22 NOTE — Progress Notes (Signed)
Ankit Karis Juba, MD Physician Addendum Physical Medicine and Rehabilitation Consult Note 08/20/2015 8:59 AM  Related encounter: Admission (Current) from 08/03/2015 in [facility] 3S STEPDOWN    Expand All Collapse All        Physical Medicine and Rehabilitation Consult  Reason for Consult: TBI Referring Physician: Dr. Joseph Art.    HPI: Lisa [name] is a 81 y.o. female with history of HTN, spinal stenosis with BLE weakness and post laminectomy syndrome with chronic pain who was admitted on 08/03/15 with fall while walking in a parking lot. History taken from son is at bedside and chart review. CT head done at [facility] revealing acute traumatic SAH along frontal convexities and acute SDH Along right convexity and left tentorium. Patient was noted to be confused with waxing and waining of mental status and was transferred to [facility] for treatment. She was evaluated by Dr. Alphonzo Lemmings who recommended conservative management with serial CCT. Follow up CCT showed delayed development of bilateral frontal ICH hematomas with edema and continued right SDH. Hospital course complicated by wide complex tachycardia with cardio-pulmonary arrest. She was intubated on 11/16 and started on IV antibiotics due to concern of aspiration PNA and large mucous plug suctioned past intubation. Sputum culture positive for H flu PNA. Cardiology consulted for input on NSVT and elevated trops. She was started on IV amiodarone and lopressor as not candidate for invasive workup. 2 D echo done revealing EF 15-20% with akinesis of entire anteroseptal myocardium likely due to stress induced cardiomyopathy.   Follow up echo on 11/23 showed improvement in EF to 25%. She was slowly weaned from vent and tolerated extubation on 11/23. Hyponatremia being managed with normal saline and mentation continues to fluctuate. She was started on dysphagia 1, honey thick as alertness is improving and therapy ongoing. Patient with  resultant balance deficits with posterior bias with standing attempts, BLE instability requiring blocking as well as delayed processing. She continues to have bouts of lethargy and follow up CT yesterday showed continued overall improvement with near resolution of SAH and bifrontal parenchymal hemorrhage as well as subtle new 3 mm midline shift. Therapy held today due to decrease in responsiveness and respiratory issues. CIR recommended by MD and rehab team for follow up therapy.   Son lives in [place]--reports that cousins live in [place] and patient was assisting multiple older friends. 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    Review of Systems  Unable to perform ROS: medical condition      Past Medical History  Diagnosis Date  . Allergy   . Pelvis fracture (HCC)   . Wrist fracture, right   . Depression   . Spinal stenosis   . Osteoarthritis   . Hypertension   . Foot fracture     bilateral   Past Surgical History  Procedure Laterality Date  . Back surgery N/A   . Breast lumpectomy N/A     Family History  Problem Relation Age of Onset  . COPD Mother   . COPD Father   . Heart disease Father     Social History: Lives alone. Independent PTA without AD. He lives in a condominium that is handicapped is accessible. 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 Per reports that she has never smoked. She has never used smokeless tobacco. Per reports that she does not drink alcohol or use illicit drugs.    Allergies  Allergen Reactions  .  Ambien [Zolpidem Tartrate] Other (See Comments)    Reaction: Hallucinations   . Oxycodone Nausea And Vomiting  . Shellfish Allergy Swelling and Other (See Comments)    Pts mouth and face swells.   . Ace Inhibitors Cough  . Daypro [Oxaprozin] Other (See Comments)    GI upset  . Norvasc [Amlodipine  Besylate] Palpitations    Facility-administered medications prior to admission  Medication Dose Route Frequency Provider Last Rate Last Dose  . lactated ringers infusion 1,000 mL 1,000 mL Intravenous Continuous Ewing SchleinGregory Crisp, MD     Medications Prior to Admission  Medication Sig Dispense Refill  . atenolol (TENORMIN) 50 MG tablet Take 50 mg by mouth daily.     . citalopram (CELEXA) 40 MG tablet Take 40 mg by mouth daily.    [name]. HYDROcodone-acetaminophen (NORCO/VICODIN) 5-325 MG tablet Take 1 tablet by mouth 4 (four) times daily as needed for moderate pain.    [name]. irbesartan (AVAPRO) 300 MG tablet Take 150 mg by mouth daily.     [name]. aspirin EC 81 MG tablet Take 81 mg by mouth daily.    . calcium-vitamin D (OSCAL WITH D) 500-200 MG-UNIT tablet Take 1 tablet by mouth 3 (three) times daily.    . cholecalciferol (VITAMIN D) 1000 UNITS tablet Take 1,000 Units by mouth daily.    . Multiple Vitamins-Minerals (ICAPS AREDS 2) CAPS Take 1 capsule by mouth 2 (two) times daily.       Home: Home Living Family/patient expects to be discharged to:: Inpatient rehab Living Arrangements: Alone  Functional History: Prior Function Level of Independence: Independent Comments: pt lives alone and does her own driving.  Functional Status:  Mobility: Bed Mobility Overal bed mobility: Needs Assistance, +2 for physical assistance Bed Mobility: Supine to Sit Supine to sit: Max assist, +2 for physical assistance Sit to supine: Max assist, +2 for physical assistance General bed mobility comments: pt needs cues and facilitation for bed mobility. After a delay pt is able to participate in coming to sitting.  Transfers Overall transfer level: Needs assistance Equipment used: 2 person hand held assist Transfers: Sit to/from Stand, Stand Pivot Transfers Sit to Stand: Max assist, +2 physical assistance Stand pivot transfers: Total assist, +2  physical assistance General transfer comment: Repeated coming to stand x2 utilizing pad under hips to A with trunk/hips extension. pt remains flexed posture and needs Bil feet blocked, but does participate with coming to stand. Total A to complete pivot.       ADL: ADL Overall ADL's : Needs assistance/impaired Eating/Feeding: NPO Grooming: Moderate assistance Grooming Details (indicate cue type and reason): Able to assist with oral care using R nondominant hand Upper Body Bathing: Maximal assistance Lower Body Bathing: Maximal assistance Upper Body Dressing : Maximal assistance Lower Body Dressing: Total assistance Toilet Transfer: +2 for physical assistance, Maximal assistance Toilet Transfer Details (indicate cue type and reason): simulated Toileting- Clothing Manipulation and Hygiene: Total assistance Functional mobility during ADLs: Maximal assistance, +2 for physical assistance  Cognition: Cognition Overall Cognitive Status: Impaired/Different from baseline Orientation Level: Other (comment) (unable to asses) Cognition Arousal/Alertness: Awake/alert Behavior During Therapy: Flat affect Overall Cognitive Status: Impaired/Different from baseline Area of Impairment: Orientation, Attention, Memory, Following commands, Safety/judgement, Awareness, Problem solving Orientation Level: (able to state she was in the hospital) Current Attention Level: Sustained Following Commands: Follows one step commands with increased time Safety/Judgement: Decreased awareness of deficits Awareness: Intellectual Problem Solving: Slow processing, Decreased initiation, Difficulty sequencing, Requires verbal cues, Requires tactile cues General Comments: Difficult  to assess cognition due to minimal verbalizations, but pt does nod head yes/no appropriately and follows simple directions with delay. pt more alert today than previous session, but remains very flat.  Difficult to assess due to:  Impaired communication (minimal hoarse whispered verbalizations)  Blood pressure 110/55, pulse 83, temperature 97.5 F (36.4 C), temperature source Axillary, resp. rate 26, height 5\' 3"  (1.6 m), weight 66.089 kg (145 lb 11.2 oz), SpO2 100 %. Physical Exam  Nursing note and vitals reviewed. Constitutional: She appears well-developed and well-nourished. She appears lethargic. She is sleeping. She has a sickly appearance. Nasal cannula in place.  HENT:  Head: Normocephalic.  Right Ear: External ear normal.  Left Ear: External ear normal.  Resolving ecchymosis left temple.  Eyes: Conjunctivae are normal.  Opened eyes briefly--pupils pinpoint.  Neck: No tracheal deviation present. No thyromegaly present.  Cardiovascular: Normal rate and regular rhythm.  No murmur heard. Respiratory: No accessory muscle usage or stridor. Tachypnea noted. No respiratory distress. She has decreased breath sounds. She has rhonchi in the right lower field and the left lower field. She exhibits no tenderness.  Increased work of breathing  GI: Soft. Bowel sounds are normal. She exhibits no distension.  Musculoskeletal: She exhibits no edema.  PROM WNL  Neurological: She appears lethargic.  Hard to arouse and opened eyes briefly to sternal rubs. Able to whisper "Ebony" after multiple attempts to state name.  LLE weakness with foot drop noted.  Able to raise BUE with perseveration.  Left lower extremity clonus Negative Hoffmann's  Skin: Skin is warm and dry.  Ecchymosis left temple Boil RUE  Psychiatric: She is agitated. Cognition and memory are impaired.  Sedated and arouses minimally     Lab Results Last 24 Hours    Results for orders placed or performed during the hospital encounter of 08/03/15 (from the past 24 hour(s))  Prepare RBC Status: None   Collection Time: 08/19/15 9:30 AM  Result Value Ref Range   Order Confirmation ORDER PROCESSED BY BLOOD BANK   Type and screen [facility] Status: None   Collection Time: 08/19/15 11:15 AM  Result Value Ref Range   ABO/RH(D) A NEG    Antibody Screen NEG    Sample Expiration 08/22/2015    Unit Number [id number]    Blood Component Type RED CELLS,LR    Unit division 00    Status of Unit ISSUED,FINAL    Transfusion Status OK TO TRANSFUSE    Crossmatch Result Compatible   ABO/Rh Status: None   Collection Time: 08/19/15 11:15 AM  Result Value Ref Range   ABO/RH(D) A NEG   Glucose, capillary Status: Abnormal   Collection Time: 08/19/15 11:31 AM  Result Value Ref Range   Glucose-Capillary 120 (H) 65 - 99 mg/dL   Comment 1 Notify RN    Comment 2 Document in Chart   Glucose, capillary Status: Abnormal   Collection Time: 08/19/15 2:52 PM  Result Value Ref Range   Glucose-Capillary 144 (H) 65 - 99 mg/dL   Comment 1 Notify RN    Comment 2 Document in Chart   Glucose, capillary Status: None   Collection Time: 08/19/15 7:26 PM  Result Value Ref Range   Glucose-Capillary 96 65 - 99 mg/dL  Glucose, capillary Status: Abnormal   Collection Time: 08/20/15 12:00 AM  Result Value Ref Range   Glucose-Capillary 109 (H) 65 - 99 mg/dL  Glucose, capillary Status: None   Collection Time: 08/20/15 3:22 AM  Result Value Ref  Range   Glucose-Capillary 97 65 - 99 mg/dL  Basic metabolic panel Status: Abnormal   Collection Time: 08/20/15 5:45 AM  Result Value Ref Range   Sodium 139 135 - 145 mmol/L   Potassium 3.8 3.5 - 5.1 mmol/L   Chloride 105 101 - 111 mmol/L   CO2 25 22 - 32 mmol/L   Glucose, Bld 104 (H) 65 - 99 mg/dL   BUN 32 (H) 6 - 20 mg/dL   Creatinine, Ser 1.61 (H) 0.44 - 1.00 mg/dL   Calcium 8.3 (L) 8.9 - 10.3 mg/dL   GFR calc non Af Amer 34 (L) >60 mL/min   GFR calc Af Amer 40 (L) >60 mL/min   Anion  gap 9 5 - 15  Magnesium Status: None   Collection Time: 08/20/15 5:45 AM  Result Value Ref Range   Magnesium 2.2 1.7 - 2.4 mg/dL  Glucose, capillary Status: None   Collection Time: 08/20/15 7:44 AM  Result Value Ref Range   Glucose-Capillary 90 65 - 99 mg/dL   Comment 1 Notify RN    Comment 2 Document in Chart       Imaging Results (Last 48 hours)    Dg Forearm Right  08/18/2015 CLINICAL DATA: Right mid forearm swelling, possible abscess EXAM: RIGHT FOREARM - 2 VIEW COMPARISON: None. FINDINGS: Two views of the right forearm submitted. No acute fracture or subluxation. Soft tissue swelling mid forearm. Cellulitis or dermatitis cannot be excluded. Clinical correlation is necessary. IMPRESSION: No acute fracture or subluxation. No evidence of osteomyelitis. Soft tissue swelling mid forearm. Cellulitis or dermatitis cannot be excluded. Electronically Signed By: Natasha Mead M.D. On: 08/18/2015 18:36   Ct Head Wo Contrast  08/19/2015 CLINICAL DATA: Subdural and subarachnoid hemorrhage improving but now with acute mental status change. EXAM: CT HEAD WITHOUT CONTRAST TECHNIQUE: Contiguous axial images were obtained from the base of the skull through the vertex without intravenous contrast. COMPARISON: 08/05/2015 FINDINGS: Examination demonstrates continued overall improvement with residual low-density right convexity subdural collection and no remaining acute subdural hemorrhage. Resolution of patchy previously seen by convexity acute subarachnoid blood. Improving bifrontal parenchymal hemorrhage with continued bifrontal edema. Suggestion of subtle midline shift to the left of 3 mm not seen previously. Ventricles and cisterns are otherwise within normal. Persistent moderate opacification of the right maxillary sinus with improved opacification of the left maxillary sinus. No other new changes. IMPRESSION: Continued overall improvement in patient's  previously noted acute right subdural hematoma with residual low-density subdural collection. Near resolution of previously seen patchy biconvexity areas of acute subarachnoid hemorrhage and bifrontal parenchymal hemorrhage with residual edema over the bifrontal region. Subtle new midline shift to the left of 3 mm. These results will be called to the ordering clinician or representative by the Radiologist Assistant, and communication documented in the PACS or zVision Dashboard. Electronically Signed By: Elberta Fortis M.D. On: 08/19/2015 09:52   Dg Chest Port 1 View  08/19/2015 CLINICAL DATA: Shortness of breath. EXAM: PORTABLE CHEST 1 VIEW COMPARISON: 08/14/2015 FINDINGS: Lungs are adequately inflated with worsening left base opacification likely small to moderate effusion with associated atelectasis. Slight worsening minimal right basilar opacification likely small amount of pleural fluid/ atelectasis. Cannot completely exclude infection in the lung bases. Mild prominence of the perihilar markings. Mild stable cardiomegaly. Remainder of the exam is unchanged. IMPRESSION: Worsening bibasilar opacification likely small to moderate left effusion and small right effusion with associated atelectasis. Cannot completely exclude infection in the lung bases. Mild stable cardiomegaly and possible minimal vascular congestion.  Electronically Signed By: Elberta Fortis M.D. On: 08/19/2015 08:43     Assessment/Plan: Diagnosis: TBI Labs and images independently reviewed. Records reviewed and summated above. [place] score: IV Speech to evaluate for Post traumatic amnesia and interval GOAT scores to assess progress. NeuroPsych evaluation for behavorial assessment. Provide environmental management by reducing the level of stimulation, tolerating restlessness when possible, protecting patient from harming self or others and reducing  patient's cognitive confusion. Address behavioral concerns include providing structured environments and daily routines. Cognitive therapy to direct modular abilities in order to maintain goals including problem solving, self regulation/monitoring, self management, attention, and memory. Fall precautions; pt at risk for second impact syndrome Prevention of secondary injury: monitor for hypotension, hypoxia, seizures or signs of increased ICP Prophylactic AED PT/OT consults for mobility strengthening, endurance training and adaptive ADLs  Consider pharmacological intervention if necessary with neurostimulants, Such as amantadine, methylphenidate, modafinil, etc. Consider Propranolol for agitation and storming Avoid medications that could impair cognitive abilities, such as anticholinergics, antihistaminic, benzodiazapines, narcotics, etc when possible  1. Does the need for close, 24 hr/day medical supervision in concert with the patient's rehab needs make it unreasonable for this patient to be served in a less intensive setting? Yes 2. Co-Morbidities requiring supervision/potential complications: Tachypnea (monitor RR with increased physical activity), CKD ( avoid nephrotoxic medications), ABLA (transfuse if necessary to ensure appropriate perfusion for increased activity tolerance), spinal stenosis ( avoid excessive extension based exercises), OA (ensure pain does not limit therapies), depression with anxiety (ensure anxiety and resulting apprehension do not limit functional progress; consider prn medications if warranted), systolic CHF (Monitor in accordance with increased physical activity and avoid UE resistance excercises), tachycardia (monitor in accordance with pain and increasing activity), chronic pain (ensure pain does not limit progression in therapies),  pressure ulcer (low air loss mattress, turns q2) 3. Due to bladder management, bowel management, safety, skin/wound care, disease management, medication administration, pain management and patient education, does the patient require 24 hr/day rehab nursing? Yes 4. Does the patient require coordinated care of a physician, rehab nurse, PT (1-2 hrs/day, 5 days/week), OT (1-2 hrs/day, 5 days/week) and SLP (1-2 hrs/day, 5 days/week) to address physical and functional deficits in the context of the above medical diagnosis(es)? Yes Addressing deficits in the following areas: balance, endurance, locomotion, strength, transferring, bowel/bladder control, bathing, dressing, feeding, grooming, toileting, cognition, speech, language, swallowing and psychosocial support 5. Can the patient actively participate in an intensive therapy program of at least 3 hrs of therapy per day at least 5 days per week?  not at present 6. The potential for patient to make measurable gains while on inpatient rehab is excellent 7. Anticipated functional outcomes upon discharge from inpatient rehab are min assist with PT, supervision and min assist with OT, modified independent and supervision with SLP. 8. Estimated rehab length of stay to reach the above functional goals is: 20-23 days. 9. Does the patient have adequate social supports and living environment to accommodate these discharge functional goals? Potentially 10. Anticipated D/C setting:  uncertain at present 11. Anticipated post D/C treatments: HH therapy and Home excercise program 12. Overall Rehab/Functional Prognosis: good and fair  RECOMMENDATIONS: This patient's condition is appropriate for continued rehabilitative care in the following setting: It is unclear at present if patient will have support at discharge. If the patient will not have support it is unlikely she will be able to achieve a modified independent level of functioning after a short IRF stay and  therefore would recommend  SNF. However, if the patient does have support att discharge, would recommend CIR once acute medical issues have stabilized and patient is able to tolerate 3 hours of therapies per day.. Patient has agreed to participate in recommended program. Potentially Note that insurance prior authorization may be required for reimbursement for recommended care.  Comment: Rehab Admissions Coordinator to follow up.  Maryla Morrow, MD 08/20/2015       Revision History     Date/Time User Provider Type Action   08/20/2015 12:58 PM Ankit Karis Juba, MD Physician Addend   08/20/2015 12:55 PM Ankit Karis Juba, MD Physician Sign   08/20/2015 12:44 PM Ankit Karis Juba, MD Physician Share   08/20/2015 12:26 PM Ankit Karis Juba, MD Physician Share   08/20/2015 9:46 AM Jacquelynn Cree, PA-C Physician Assistant Share   View Details Report       Routing History     Date/Time From To Method   08/20/2015 12:58 PM Ankit Karis Juba, MD Ankit Karis Juba, MD In [facility]   08/20/2015 12:58 PM Ankit Karis Juba, MD Kandyce Rud, MD Fax

## 2015-08-22 NOTE — H&P (Signed)
Physical Medicine and Rehabilitation Admission H&P   CC: BLE weakness with instability inability to stand, difficulty initiating movement, delayed processing, and dysphagia.    HPI: Lisa [name] is a 81 y.o. Left handed female with history of HTN, depression, spinal stenosis with BLE weakness and post laminectomy syndrome with chronic pain who was admitted on 08/03/15 after a fall in parking lot and subsequent acute traumatic SAH along frontal convexities and acute SDH with confusion and waxing and waining of mental status. She was transferred to [facility] form [facility] and was evaluated by Dr. Arneta Cliche who recommended conservative management with serial CCT. Follow up CCT showed delayed development of bilateral frontal ICH hematomas with edema and continued right SDH. Hospital course complicated by wide complex tachycardia with cardio-pulmonary arrest. She was intubated on 11/16 and started on IV antibiotics due to concern of aspiration PNA and large mucous plug suctioned past intubation. Sputum culture was positive for H flu PNA.   Cardiology consulted for input on NSVT and elevated trops. She was started on IV amiodarone and lopressor as not candidate for invasive workup. 2 D echo done revealing EF 15-20% with akinesis of entire anteroseptal myocardium likely due to stress induced cardiomyopathy. Follow up echo on 11/23 showed improvement in EF to 25%. She was extubated on 11/23 and started on dysphagia 1, honey thick as alertness improved. She continues to have bouts of lethargy and follow up CT 11/27 showed overall improvement with near resolution of SAH and bifrontal parenchymal hemorrhage as well as subtle new 3 mm midline shift. Hypoxia due to ABLA has improved with transfusion of 1 unit PRBC. Patient with resultant balance deficits needing assist for sitting balance at EOB, with posterior bias with standing attempts, BLE instability requiring blocking, difficulty initiating movement--question  apraxia as well as delayed processing. CIR recommended by MD and rehab team for follow up therapy.      Review of Systems  Constitutional: Positive for malaise/fatigue.  HENT: Positive for congestion and sore throat. Negative for hearing loss.   Loss of sense of smell with this injury?  Eyes: Negative for blurred vision and double vision.  Respiratory: Positive for cough. Negative for sputum production.  Cardiovascular: Negative for chest pain and palpitations.  Gastrointestinal: Positive for heartburn. Negative for nausea, vomiting, constipation and blood in stool.  Genitourinary: Positive for urgency and frequency (chronic).  Musculoskeletal: Positive for myalgias, back pain (chronic pain radiating to LLE. Per reports-- tendend to shuffle when she walked due to left foot drop) and falls.  Neurological: Positive for sensory change, focal weakness and headaches. Negative for dizziness and tingling.  Psychiatric/Behavioral: Positive for depression (controlled with meds). The patient is nervous/anxious.    Past Medical History  Diagnosis Date  . Allergy   . Pelvis fracture ([place])   . Wrist fracture, right   . Depression   . Spinal stenosis   . Osteoarthritis   . Hypertension   . Foot fracture     bilateral    Past Surgical History  Procedure Laterality Date  . Back surgery N/A   . Breast lumpectomy N/A     Family History  Problem Relation Age of Onset  . COPD Mother   . COPD Father   . 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  Son lives in [place]--reports that cousins live in [place] and patient was assisting multiple older friends. She reports that she has never smoked.  She has never used smokeless tobacco. She reports that she does not drink alcohol or use illicit drugs.    Allergies  Allergen Reactions  . Ambien [Zolpidem  Tartrate] Other (See Comments)    Reaction: Hallucinations   . Oxycodone Nausea And Vomiting  . Shellfish Allergy Swelling and Other (See Comments)    Pts mouth and face swells.   . Ace Inhibitors Cough  . Daypro [Oxaprozin] Other (See Comments)    GI upset  . Norvasc [Amlodipine Besylate] Palpitations    Facility-administered medications prior to admission  Medication Dose Route Frequency Provider Last Rate Last Dose  . lactated ringers infusion 1,000 mL 1,000 mL Intravenous Continuous Mohammed Kindle, MD      Medications Prior to Admission  Medication Sig Dispense Refill  . atenolol (TENORMIN) 50 MG tablet Take 50 mg by mouth daily.     . citalopram (CELEXA) 40 MG tablet Take 40 mg by mouth daily.    [name] HYDROcodone-acetaminophen (NORCO/VICODIN) 5-325 MG tablet Take 1 tablet by mouth 4 (four) times daily as needed for moderate pain.    [name] irbesartan (AVAPRO) 300 MG tablet Take 150 mg by mouth daily.     [name] aspirin EC 81 MG tablet Take 81 mg by mouth daily.    . calcium-vitamin D (OSCAL WITH D) 500-200 MG-UNIT tablet Take 1 tablet by mouth 3 (three) times daily.    . cholecalciferol (VITAMIN D) 1000 UNITS tablet Take 1,000 Units by mouth daily.    . Multiple Vitamins-Minerals (ICAPS AREDS 2) CAPS Take 1 capsule by mouth 2 (two) times daily.       Home: Home Living Family/patient expects to be discharged to:: Inpatient rehab Living Arrangements: Alone  Functional History: Prior Function Level of Independence: Independent Comments: pt lives alone and does her own driving.   Functional Status:  Mobility: Bed Mobility Overal bed mobility: Needs Assistance, +2 for physical assistance Bed Mobility: Supine to Sit Supine to sit: Max assist, +2 for physical assistance Sit to supine: Max assist, +2 for physical assistance General bed mobility comments: Pt demonstrates difficulty initiating  movement but once provided tactile cues pt participates in coming EOB. A to trunk and Bil LEs and use of bed pad  Transfers Overall transfer level: Needs assistance Equipment used: 2 person hand held assist Transfers: Sit to/from Stand, Stand Pivot Transfers Sit to Stand: +2 physical assistance, Max assist Stand pivot transfers: +2 physical assistance, Max assist General transfer comment: A to boost to standing, pt shuffles feet minimally during pivot but participates. Bil knees blocked. Pt maintains Bil knee extension rather than activiating muscles.      ADL: ADL Overall ADL's : Needs assistance/impaired Eating/Feeding: NPO Grooming: Brushing hair, Sitting, Applying deodorant, Wash/dry face, Minimal assistance Grooming Details (indicate cue type and reason): Able to assist with oral care using R nondominant hand Upper Body Bathing: Set up, Supervision/ safety, Sitting Upper Body Bathing Details (indicate cue type and reason): did not wash complete UB Lower Body Bathing: Maximal assistance Upper Body Dressing : Maximal assistance Upper Body Dressing Details (indicate cue type and reason): OT assisted with gown Lower Body Dressing: Total assistance Toilet Transfer: +2 for physical assistance, Maximal assistance, Stand-pivot (from bed to chair) Toilet Transfer Details (indicate cue type and reason): simulated Toileting- Clothing Manipulation and Hygiene: Total assistance Functional mobility during ADLs: +2 for physical assistance, Maximal assistance (stand pivot to chair) General ADL Comments: Pt required assist for balance sitting EOB during ADL task.  Cognition: Cognition Overall Cognitive Status:  No family/caregiver present to determine baseline cognitive functioning Orientation Level: Oriented X4 Cognition Arousal/Alertness: Awake/alert Behavior During Therapy: Flat affect Overall Cognitive Status: No family/caregiver present to determine baseline cognitive functioning Area  of Impairment: Orientation, Attention, Memory, Following commands, Safety/judgement, Awareness, Problem solving Orientation Level: Disoriented to, Place [facility] in [facility]") Current Attention Level: Sustained Memory: Decreased short-term memory Following Commands: Follows one step commands with increased time Safety/Judgement: Decreased awareness of deficits Awareness: Intellectual Problem Solving: Slow processing, Decreased initiation, Difficulty sequencing, Requires verbal cues, Requires tactile cues General Comments: Difficult to assess cognition due to minimal verbalizations, but pt does nod head yes/no appropriately and follows simple directions with delay.  Difficult to assess due to: (minimal hoarse whispered verbalizations)  Physical Exam: Blood pressure 146/65, pulse 88, temperature 98.1 F (36.7 C), temperature source Oral, resp. rate 20, height 5' 3"  (1.6 m), weight 67 kg (147 lb 11.3 oz), SpO2 98 %. Physical Exam  Nursing note and vitals reviewed. Constitutional: She is oriented to person, place, and time. She appears well-developed and well-nourished.  Up in bed rubbing her head due to pain.  HENT:  Head: Normocephalic and atraumatic.  Right Ear: No decreased hearing is noted.  Left Ear: No decreased hearing is noted.  Mouth/Throat: Mucous membranes are dry. Abnormal dentition.  Ecchymosis left forehead almost resolved  Eyes: Conjunctivae are normal. Pupils are equal, round, and reactive to light.  Left ptosis due to history of Bell's palsy--worse now per son.  Neck: Normal range of motion. Neck supple.  Cardiovascular: Normal rate and regular rhythm.  Respiratory: Effort normal and breath sounds normal. No respiratory distress. She has no wheezes.  GI: Soft. Bowel sounds are normal. She exhibits no distension. There is no tenderness. There is no rebound.  Musculoskeletal: She exhibits no edema or tenderness.  Neurological: She is alert and oriented to  person, place, and time. A cranial nerve deficit is present.  Left ptosis. Was alert initially but fatigued by end of exam. Hoarse/ dysphonic speech with dry irritating cough. Able to state day, month, city, state and Pres. Was unable to state year and perseverated occasionally. Able to follow one and two step motor commands. Skin: Skin is warm and dry.  Multiple scabbed areas bilateral feet. Gluteal cleft with dry flaky skin.  Psychiatric: She has a normal mood and affect. Her speech is not tangential and not slurred. She is slowed. Cognition and memory are impaired.  Motor strength is 4/5 bilateral deltoids, biceps, triceps, grip, hip flexor, knee extensors, ankle dorsiflexor and plantar flexor Sensation intact to light touch in bilateral upper and lower limbs   Lab Results Last 48 Hours    Results for orders placed or performed during the hospital encounter of 08/03/15 (from the past 48 hour(s))  Glucose, capillary Status: Abnormal   Collection Time: 08/20/15 12:51 PM  Result Value Ref Range   Glucose-Capillary 183 (H) 65 - 99 mg/dL   Comment 1 Notify RN    Comment 2 Document in Chart   Glucose, capillary Status: Abnormal   Collection Time: 08/20/15 3:32 PM  Result Value Ref Range   Glucose-Capillary 213 (H) 65 - 99 mg/dL   Comment 1 Notify RN    Comment 2 Document in Chart   Glucose, capillary Status: Abnormal   Collection Time: 08/20/15 7:03 PM  Result Value Ref Range   Glucose-Capillary 103 (H) 65 - 99 mg/dL  Glucose, capillary Status: Abnormal   Collection Time: 08/20/15 10:58 PM  Result Value Ref Range  Glucose-Capillary 151 (H) 65 - 99 mg/dL  Glucose, capillary Status: None   Collection Time: 08/21/15 3:01 AM  Result Value Ref Range   Glucose-Capillary 99 65 - 99 mg/dL  CBC Status: Abnormal   Collection Time: 08/21/15 4:24 AM  Result Value Ref Range   WBC 10.2 4.0  - 10.5 K/uL    Comment: WHITE COUNT CONFIRMED ON SMEAR   RBC 3.32 (L) 3.87 - 5.11 MIL/uL   Hemoglobin 10.0 (L) 12.0 - 15.0 g/dL   HCT 30.3 (L) 36.0 - 46.0 %   MCV 91.3 78.0 - 100.0 fL   MCH 30.1 26.0 - 34.0 pg   MCHC 33.0 30.0 - 36.0 g/dL   RDW 17.4 (H) 11.5 - 15.5 %   Platelets 311 150 - 400 K/uL    Comment: SPECIMEN CHECKED FOR CLOTS PLATELET COUNT CONFIRMED BY SMEAR   Basic metabolic panel Status: Abnormal   Collection Time: 08/21/15 4:24 AM  Result Value Ref Range   Sodium 138 135 - 145 mmol/L   Potassium 3.8 3.5 - 5.1 mmol/L   Chloride 105 101 - 111 mmol/L   CO2 23 22 - 32 mmol/L   Glucose, Bld 95 65 - 99 mg/dL   BUN 32 (H) 6 - 20 mg/dL   Creatinine, Ser 1.41 (H) 0.44 - 1.00 mg/dL   Calcium 8.4 (L) 8.9 - 10.3 mg/dL   GFR calc non Af Amer 33 (L) >60 mL/min   GFR calc Af Amer 38 (L) >60 mL/min    Comment: (NOTE) The eGFR has been calculated using the CKD EPI equation. This calculation has not been validated in all clinical situations. eGFR's persistently <60 mL/min signify possible Chronic Kidney Disease.    Anion gap 10 5 - 15  Magnesium Status: None   Collection Time: 08/21/15 4:24 AM  Result Value Ref Range   Magnesium 1.9 1.7 - 2.4 mg/dL  Glucose, capillary Status: None   Collection Time: 08/21/15 7:19 AM  Result Value Ref Range   Glucose-Capillary 97 65 - 99 mg/dL   Comment 1 Notify RN    Comment 2 Document in Chart   Glucose, capillary Status: Abnormal   Collection Time: 08/21/15 12:49 PM  Result Value Ref Range   Glucose-Capillary 111 (H) 65 - 99 mg/dL   Comment 1 Notify RN    Comment 2 Document in Chart   Glucose, capillary Status: None   Collection Time: 08/21/15 3:10 PM  Result Value Ref Range   Glucose-Capillary 98 65 - 99 mg/dL   Comment 1 Notify RN    Comment 2 Document  in Chart   Glucose, capillary Status: None   Collection Time: 08/21/15 7:14 PM  Result Value Ref Range   Glucose-Capillary 94 65 - 99 mg/dL  Glucose, capillary Status: Abnormal   Collection Time: 08/21/15 10:58 PM  Result Value Ref Range   Glucose-Capillary 122 (H) 65 - 99 mg/dL  Glucose, capillary Status: None   Collection Time: 08/22/15 3:45 AM  Result Value Ref Range   Glucose-Capillary 88 65 - 99 mg/dL      Imaging Results (Last 48 hours)    Dg Chest Port 1 View  08/21/2015 CLINICAL DATA: Recent congestive heart failure. Diminished breath sounds EXAM: PORTABLE CHEST 1 VIEW COMPARISON: August 19, 2015 FINDINGS: There has been interval clearing of opacity from the left base. Currently there is mild left base atelectasis. There is no frank edema or consolidation. Heart remains slightly enlarged with pulmonary vascularity currently within normal limits. No adenopathy. Central catheter  tip is at the cavoatrial junction. No pneumothorax. IMPRESSION: Interval clearing left base. There is now mild atelectasis in this region. Currently no edema or consolidation. Stable cardiac prominence. No pneumothorax. Electronically Signed By: Lowella Grip III M.D. On: 08/21/2015 12:53        Medical Problem List and Plan: 1. Cognitive deficits, gait disorder secondary to Traumatic frontal intracranial hemorrhage, subarachnoid hemorrhage and subdural hemorrhage 2. DVT Prophylaxis/Anticoagulation: Mechanical: Sequential compression devices, below knee Bilateral lower extremities 3. Chronic back pain/Pain Management: Used hydrocodone tid due to back and LE pain. Last [facility] 07/16/15. Monitor for symptoms with increase in activity.  4. Mood: LCSW to follow for evaluation and support.  5. Neuropsych: This patient is not fully capable of making decisions on her own behalf. 6. Skin/Wound Care: Routine pressure relief measures. Maintain adequate  nutrition and hyrdration status.  7. Fluids/Electrolytes/Nutrition: Continue Dysphagia 1, honey liquids as swallow function shows minimal improvement. Will change IVF to nights and push fluids during the day.  8. Acute on chronic renal failure: Baseline Cr appears to be 1.48 per record review.  9. HTN: Monitor tid for now.  10. Acute on chronic Iron deficiency anemia: Add iron supplement 11. Aspiration PNA/H flu PNA: Has completed antibiotic course.  12. 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 Continue amiodarone daily and lopressor bid. To follow up with [facility] after discharge.  13. Rectal bleeding: Has resolved--monitor for recurrence. Will need follow up evaluation after discharge.  14. Depression: Used to take Celexa and Abilify PTA with good results per family.  15. Urinary retention?: Foley replaced due to poor UOP.   Post Admission Physician Evaluation: 1. Functional deficits secondary to Traumatic bifrontal intracranial hemorrhage, subarachnoid hemorrhage, subdural hemorrhage. 2. Patient is admitted to receive collaborative, interdisciplinary care between the physiatrist, rehab nursing staff, and therapy team. 3. Patient's level of medical complexity and substantial therapy needs in context of that medical necessity cannot be provided at a lesser intensity of care such as a SNF. 4. Patient has experienced substantial functional loss from his/her baseline which was documented above under the "Functional History" and "Functional Status" headings. Judging by the patient's diagnosis, physical exam, and functional history, the patient has potential for functional progress which will result in measurable gains while on inpatient rehab. These gains will be of substantial and practical use upon discharge in facilitating mobility and self-care at the household level. 5. Physiatrist will provide 24 hour management of medical needs as well as oversight of the  therapy plan/treatment and provide guidance as appropriate regarding the interaction of the two. 6. 24 hour rehab nursing will assist with bladder management, bowel management, safety, skin/wound care, disease management, medication administration, pain management and patient education and help integrate therapy concepts, techniques,education, etc. 7. PT will assess and treat for/with: pre gait, gait training, endurance , safety, equipment, neuromuscular re education. Goals are: Supervision assistance. 8. OT will assess and treat for/with: ADLs, Cognitive perceptual skills, Neuromuscular re education, safety, endurance, equipment. Goals are: Supervision assistance. Therapy May proceed with showering this patient. 9. SLP will assess and treat for/with: Memory, attention, problem solving, sequencing. Goals are: Modified independent with ADLs sequencing, supervision with med management. 10. Case Management and Social Worker will assess and treat for psychological issues and discharge planning. 11. Team conference will be held weekly to assess progress toward goals and to determine barriers to discharge. 12. Patient will receive at least 3 hours of therapy per day at least 5 days per week. 13.  ELOS: 21-25 days  14. Prognosis: good     Charlett Blake M.D. [employer] FAAPM&R (Sports Med, Neuromuscular Med) Diplomate Am Board of Electrodiagnostic Med  08/22/2015

## 2015-08-22 NOTE — Plan of Care (Signed)
Problem: Education: Goal: Knowledge of  General Education information/materials will improve Outcome: Progressing Pt going to rehab

## 2015-08-22 NOTE — Progress Notes (Signed)
Pt transferred to inpatient rehab with son and belongings, picc line and foley remain in situ. Discussed and explained the discharge summary with son.

## 2015-08-22 NOTE — H&P (View-Only) (Signed)
Physical Medicine and Rehabilitation Admission H&P   CC: BLE weakness with instability inability to stand, difficulty initiating movement, delayed processing, and dysphagia.    HPI: Lisa [name] is a 81 y.o. Left handed female with history of HTN, depression, spinal stenosis with BLE weakness and post laminectomy syndrome with chronic pain who was admitted on 08/03/15 after a fall in parking lot and subsequent acute traumatic SAH along frontal convexities and acute SDH with confusion and waxing and waining of mental status. She was transferred to [facility] form [facility] and was evaluated by Dr. Arneta Cliche who recommended conservative management with serial CCT. Follow up CCT showed delayed development of bilateral frontal ICH hematomas with edema and continued right SDH. Hospital course complicated by wide complex tachycardia with cardio-pulmonary arrest. She was intubated on 11/16 and started on IV antibiotics due to concern of aspiration PNA and large mucous plug suctioned past intubation. Sputum culture was positive for H flu PNA.   Cardiology consulted for input on NSVT and elevated trops. She was started on IV amiodarone and lopressor as not candidate for invasive workup. 2 D echo done revealing EF 15-20% with akinesis of entire anteroseptal myocardium likely due to stress induced cardiomyopathy. Follow up echo on 11/23 showed improvement in EF to 25%. She was extubated on 11/23 and started on dysphagia 1, honey thick as alertness improved. She continues to have bouts of lethargy and follow up CT 11/27 showed overall improvement with near resolution of SAH and bifrontal parenchymal hemorrhage as well as subtle new 3 mm midline shift. Hypoxia due to ABLA has improved with transfusion of 1 unit PRBC. Patient with resultant balance deficits needing assist for sitting balance at EOB, with posterior bias with standing attempts, BLE instability requiring blocking, difficulty initiating movement--question  apraxia as well as delayed processing. CIR recommended by MD and rehab team for follow up therapy.      Review of Systems  Constitutional: Positive for malaise/fatigue.  HENT: Positive for congestion and sore throat. Negative for hearing loss.   Loss of sense of smell with this injury?  Eyes: Negative for blurred vision and double vision.  Respiratory: Positive for cough. Negative for sputum production.  Cardiovascular: Negative for chest pain and palpitations.  Gastrointestinal: Positive for heartburn. Negative for nausea, vomiting, constipation and blood in stool.  Genitourinary: Positive for urgency and frequency (chronic).  Musculoskeletal: Positive for myalgias, back pain (chronic pain radiating to LLE. Per reports-- tendend to shuffle when she walked due to left foot drop) and falls.  Neurological: Positive for sensory change, focal weakness and headaches. Negative for dizziness and tingling.  Psychiatric/Behavioral: Positive for depression (controlled with meds). The patient is nervous/anxious.    Past Medical History  Diagnosis Date  . Allergy   . Pelvis fracture ([place])   . Wrist fracture, right   . Depression   . Spinal stenosis   . Osteoarthritis   . Hypertension   . Foot fracture     bilateral    Past Surgical History  Procedure Laterality Date  . Back surgery N/A   . Breast lumpectomy N/A     Family History  Problem Relation Age of Onset  . COPD Mother   . COPD Father   . 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  Son lives in [place]--reports that cousins live in [place] and patient was assisting multiple older friends. She reports that she has never smoked.  She has never used smokeless tobacco. She reports that she does not drink alcohol or use illicit drugs.    Allergies  Allergen Reactions  . Ambien [Zolpidem  Tartrate] Other (See Comments)    Reaction: Hallucinations   . Oxycodone Nausea And Vomiting  . Shellfish Allergy Swelling and Other (See Comments)    Pts mouth and face swells.   . Ace Inhibitors Cough  . Daypro [Oxaprozin] Other (See Comments)    GI upset  . Norvasc [Amlodipine Besylate] Palpitations    Facility-administered medications prior to admission  Medication Dose Route Frequency Provider Last Rate Last Dose  . lactated ringers infusion 1,000 mL 1,000 mL Intravenous Continuous Mohammed Kindle, MD      Medications Prior to Admission  Medication Sig Dispense Refill  . atenolol (TENORMIN) 50 MG tablet Take 50 mg by mouth daily.     . citalopram (CELEXA) 40 MG tablet Take 40 mg by mouth daily.    [name] HYDROcodone-acetaminophen (NORCO/VICODIN) 5-325 MG tablet Take 1 tablet by mouth 4 (four) times daily as needed for moderate pain.    [name] irbesartan (AVAPRO) 300 MG tablet Take 150 mg by mouth daily.     [name] aspirin EC 81 MG tablet Take 81 mg by mouth daily.    . calcium-vitamin D (OSCAL WITH D) 500-200 MG-UNIT tablet Take 1 tablet by mouth 3 (three) times daily.    . cholecalciferol (VITAMIN D) 1000 UNITS tablet Take 1,000 Units by mouth daily.    . Multiple Vitamins-Minerals (ICAPS AREDS 2) CAPS Take 1 capsule by mouth 2 (two) times daily.       Home: Home Living Family/patient expects to be discharged to:: Inpatient rehab Living Arrangements: Alone  Functional History: Prior Function Level of Independence: Independent Comments: pt lives alone and does her own driving.   Functional Status:  Mobility: Bed Mobility Overal bed mobility: Needs Assistance, +2 for physical assistance Bed Mobility: Supine to Sit Supine to sit: Max assist, +2 for physical assistance Sit to supine: Max assist, +2 for physical assistance General bed mobility comments: Pt demonstrates difficulty initiating  movement but once provided tactile cues pt participates in coming EOB. A to trunk and Bil LEs and use of bed pad  Transfers Overall transfer level: Needs assistance Equipment used: 2 person hand held assist Transfers: Sit to/from Stand, Stand Pivot Transfers Sit to Stand: +2 physical assistance, Max assist Stand pivot transfers: +2 physical assistance, Max assist General transfer comment: A to boost to standing, pt shuffles feet minimally during pivot but participates. Bil knees blocked. Pt maintains Bil knee extension rather than activiating muscles.      ADL: ADL Overall ADL's : Needs assistance/impaired Eating/Feeding: NPO Grooming: Brushing hair, Sitting, Applying deodorant, Wash/dry face, Minimal assistance Grooming Details (indicate cue type and reason): Able to assist with oral care using R nondominant hand Upper Body Bathing: Set up, Supervision/ safety, Sitting Upper Body Bathing Details (indicate cue type and reason): did not wash complete UB Lower Body Bathing: Maximal assistance Upper Body Dressing : Maximal assistance Upper Body Dressing Details (indicate cue type and reason): OT assisted with gown Lower Body Dressing: Total assistance Toilet Transfer: +2 for physical assistance, Maximal assistance, Stand-pivot (from bed to chair) Toilet Transfer Details (indicate cue type and reason): simulated Toileting- Clothing Manipulation and Hygiene: Total assistance Functional mobility during ADLs: +2 for physical assistance, Maximal assistance (stand pivot to chair) General ADL Comments: Pt required assist for balance sitting EOB during ADL task.  Cognition: Cognition Overall Cognitive Status:  No family/caregiver present to determine baseline cognitive functioning Orientation Level: Oriented X4 Cognition Arousal/Alertness: Awake/alert Behavior During Therapy: Flat affect Overall Cognitive Status: No family/caregiver present to determine baseline cognitive functioning Area  of Impairment: Orientation, Attention, Memory, Following commands, Safety/judgement, Awareness, Problem solving Orientation Level: Disoriented to, Place [facility]") Current Attention Level: Sustained Memory: Decreased short-term memory Following Commands: Follows one step commands with increased time Safety/Judgement: Decreased awareness of deficits Awareness: Intellectual Problem Solving: Slow processing, Decreased initiation, Difficulty sequencing, Requires verbal cues, Requires tactile cues General Comments: Difficult to assess cognition due to minimal verbalizations, but pt does nod head yes/no appropriately and follows simple directions with delay.  Difficult to assess due to: (minimal hoarse whispered verbalizations)  Physical Exam: Blood pressure 146/65, pulse 88, temperature 98.1 F (36.7 C), temperature source Oral, resp. rate 20, height 5' 3"  (1.6 m), weight 67 kg (147 lb 11.3 oz), SpO2 98 %. Physical Exam  Nursing note and vitals reviewed. Constitutional: She is oriented to person, place, and time. She appears well-developed and well-nourished.  Up in bed rubbing her head due to pain.  HENT:  Head: Normocephalic and atraumatic.  Right Ear: No decreased hearing is noted.  Left Ear: No decreased hearing is noted.  Mouth/Throat: Mucous membranes are dry. Abnormal dentition.  Ecchymosis left forehead almost resolved  Eyes: Conjunctivae are normal. Pupils are equal, round, and reactive to light.  Left ptosis due to history of Bell's palsy--worse now per son.  Neck: Normal range of motion. Neck supple.  Cardiovascular: Normal rate and regular rhythm.  Respiratory: Effort normal and breath sounds normal. No respiratory distress. She has no wheezes.  GI: Soft. Bowel sounds are normal. She exhibits no distension. There is no tenderness. There is no rebound.  Musculoskeletal: She exhibits no edema or tenderness.  Neurological: She is alert and oriented to  person, place, and time. A cranial nerve deficit is present.  Left ptosis. Was alert initially but fatigued by end of exam. Hoarse/ dysphonic speech with dry irritating cough. Able to state day, month, city, state and Pres. Was unable to state year and perseverated occasionally. Able to follow one and two step motor commands. Skin: Skin is warm and dry.  Multiple scabbed areas bilateral feet. Gluteal cleft with dry flaky skin.  Psychiatric: She has a normal mood and affect. Her speech is not tangential and not slurred. She is slowed. Cognition and memory are impaired.  Motor strength is 4/5 bilateral deltoids, biceps, triceps, grip, hip flexor, knee extensors, ankle dorsiflexor and plantar flexor Sensation intact to light touch in bilateral upper and lower limbs   Lab Results Last 48 Hours    Results for orders placed or performed during the hospital encounter of 08/03/15 (from the past 48 hour(s))  Glucose, capillary Status: Abnormal   Collection Time: 08/20/15 12:51 PM  Result Value Ref Range   Glucose-Capillary 183 (H) 65 - 99 mg/dL   Comment 1 Notify RN    Comment 2 Document in Chart   Glucose, capillary Status: Abnormal   Collection Time: 08/20/15 3:32 PM  Result Value Ref Range   Glucose-Capillary 213 (H) 65 - 99 mg/dL   Comment 1 Notify RN    Comment 2 Document in Chart   Glucose, capillary Status: Abnormal   Collection Time: 08/20/15 7:03 PM  Result Value Ref Range   Glucose-Capillary 103 (H) 65 - 99 mg/dL  Glucose, capillary Status: Abnormal   Collection Time: 08/20/15 10:58 PM  Result Value Ref Range  Glucose-Capillary 151 (H) 65 - 99 mg/dL  Glucose, capillary Status: None   Collection Time: 08/21/15 3:01 AM  Result Value Ref Range   Glucose-Capillary 99 65 - 99 mg/dL  CBC Status: Abnormal   Collection Time: 08/21/15 4:24 AM  Result Value Ref Range   WBC 10.2 4.0  - 10.5 K/uL    Comment: WHITE COUNT CONFIRMED ON SMEAR   RBC 3.32 (L) 3.87 - 5.11 MIL/uL   Hemoglobin 10.0 (L) 12.0 - 15.0 g/dL   HCT 30.3 (L) 36.0 - 46.0 %   MCV 91.3 78.0 - 100.0 fL   MCH 30.1 26.0 - 34.0 pg   MCHC 33.0 30.0 - 36.0 g/dL   RDW 17.4 (H) 11.5 - 15.5 %   Platelets 311 150 - 400 K/uL    Comment: SPECIMEN CHECKED FOR CLOTS PLATELET COUNT CONFIRMED BY SMEAR   Basic metabolic panel Status: Abnormal   Collection Time: 08/21/15 4:24 AM  Result Value Ref Range   Sodium 138 135 - 145 mmol/L   Potassium 3.8 3.5 - 5.1 mmol/L   Chloride 105 101 - 111 mmol/L   CO2 23 22 - 32 mmol/L   Glucose, Bld 95 65 - 99 mg/dL   BUN 32 (H) 6 - 20 mg/dL   Creatinine, Ser 1.41 (H) 0.44 - 1.00 mg/dL   Calcium 8.4 (L) 8.9 - 10.3 mg/dL   GFR calc non Af Amer 33 (L) >60 mL/min   GFR calc Af Amer 38 (L) >60 mL/min    Comment: (NOTE) The eGFR has been calculated using the CKD EPI equation. This calculation has not been validated in all clinical situations. eGFR's persistently <60 mL/min signify possible Chronic Kidney Disease.    Anion gap 10 5 - 15  Magnesium Status: None   Collection Time: 08/21/15 4:24 AM  Result Value Ref Range   Magnesium 1.9 1.7 - 2.4 mg/dL  Glucose, capillary Status: None   Collection Time: 08/21/15 7:19 AM  Result Value Ref Range   Glucose-Capillary 97 65 - 99 mg/dL   Comment 1 Notify RN    Comment 2 Document in Chart   Glucose, capillary Status: Abnormal   Collection Time: 08/21/15 12:49 PM  Result Value Ref Range   Glucose-Capillary 111 (H) 65 - 99 mg/dL   Comment 1 Notify RN    Comment 2 Document in Chart   Glucose, capillary Status: None   Collection Time: 08/21/15 3:10 PM  Result Value Ref Range   Glucose-Capillary 98 65 - 99 mg/dL   Comment 1 Notify RN    Comment 2 Document  in Chart   Glucose, capillary Status: None   Collection Time: 08/21/15 7:14 PM  Result Value Ref Range   Glucose-Capillary 94 65 - 99 mg/dL  Glucose, capillary Status: Abnormal   Collection Time: 08/21/15 10:58 PM  Result Value Ref Range   Glucose-Capillary 122 (H) 65 - 99 mg/dL  Glucose, capillary Status: None   Collection Time: 08/22/15 3:45 AM  Result Value Ref Range   Glucose-Capillary 88 65 - 99 mg/dL      Imaging Results (Last 48 hours)    Dg Chest Port 1 View  08/21/2015 CLINICAL DATA: Recent congestive heart failure. Diminished breath sounds EXAM: PORTABLE CHEST 1 VIEW COMPARISON: August 19, 2015 FINDINGS: There has been interval clearing of opacity from the left base. Currently there is mild left base atelectasis. There is no frank edema or consolidation. Heart remains slightly enlarged with pulmonary vascularity currently within normal limits. No adenopathy. Central catheter  tip is at the cavoatrial junction. No pneumothorax. IMPRESSION: Interval clearing left base. There is now mild atelectasis in this region. Currently no edema or consolidation. Stable cardiac prominence. No pneumothorax. Electronically Signed By: Lowella Grip III M.D. On: 08/21/2015 12:53        Medical Problem List and Plan: 1. Cognitive deficits, gait disorder secondary to Traumatic frontal intracranial hemorrhage, subarachnoid hemorrhage and subdural hemorrhage 2. DVT Prophylaxis/Anticoagulation: Mechanical: Sequential compression devices, below knee Bilateral lower extremities 3. Chronic back pain/Pain Management: Used hydrocodone tid due to back and LE pain. Last [facility] 07/16/15. Monitor for symptoms with increase in activity.  4. Mood: LCSW to follow for evaluation and support.  5. Neuropsych: This patient is not fully capable of making decisions on her own behalf. 6. Skin/Wound Care: Routine pressure relief measures. Maintain adequate  nutrition and hyrdration status.  7. Fluids/Electrolytes/Nutrition: Continue Dysphagia 1, honey liquids as swallow function shows minimal improvement. Will change IVF to nights and push fluids during the day.  8. Acute on chronic renal failure: Baseline Cr appears to be 1.48 per record review.  9. HTN: Monitor tid for now.  10. Acute on chronic Iron deficiency anemia: Add iron supplement 11. Aspiration PNA/H flu PNA: Has completed antibiotic course.  12. 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 Continue amiodarone daily and lopressor bid. To follow up with [facility] after discharge.  13. Rectal bleeding: Has resolved--monitor for recurrence. Will need follow up evaluation after discharge.  14. Depression: Used to take Celexa and Abilify PTA with good results per family.  15. Urinary retention?: Foley replaced due to poor UOP.   Post Admission Physician Evaluation: 1. Functional deficits secondary to Traumatic bifrontal intracranial hemorrhage, subarachnoid hemorrhage, subdural hemorrhage. 2. Patient is admitted to receive collaborative, interdisciplinary care between the physiatrist, rehab nursing staff, and therapy team. 3. Patient's level of medical complexity and substantial therapy needs in context of that medical necessity cannot be provided at a lesser intensity of care such as a SNF. 4. Patient has experienced substantial functional loss from his/her baseline which was documented above under the "Functional History" and "Functional Status" headings. Judging by the patient's diagnosis, physical exam, and functional history, the patient has potential for functional progress which will result in measurable gains while on inpatient rehab. These gains will be of substantial and practical use upon discharge in facilitating mobility and self-care at the household level. 5. Physiatrist will provide 24 hour management of medical needs as well as oversight of the  therapy plan/treatment and provide guidance as appropriate regarding the interaction of the two. 6. 24 hour rehab nursing will assist with bladder management, bowel management, safety, skin/wound care, disease management, medication administration, pain management and patient education and help integrate therapy concepts, techniques,education, etc. 7. PT will assess and treat for/with: pre gait, gait training, endurance , safety, equipment, neuromuscular re education. Goals are: Supervision assistance. 8. OT will assess and treat for/with: ADLs, Cognitive perceptual skills, Neuromuscular re education, safety, endurance, equipment. Goals are: Supervision assistance. Therapy May proceed with showering this patient. 9. SLP will assess and treat for/with: Memory, attention, problem solving, sequencing. Goals are: Modified independent with ADLs sequencing, supervision with med management. 10. Case Management and Social Worker will assess and treat for psychological issues and discharge planning. 11. Team conference will be held weekly to assess progress toward goals and to determine barriers to discharge. 12. Patient will receive at least 3 hours of therapy per day at least 5 days per week. 13.  ELOS: 21-25 days  14. Prognosis: good     Charlett Blake M.D. [facility] FAAPM&R (Sports Med, Neuromuscular Med) Diplomate Am Board of Electrodiagnostic Med  08/22/2015

## 2015-08-22 NOTE — Progress Notes (Signed)
Rehab admissions - I spoke with Dr. Susie CassetteAbrol and I am planning to admit to acute inpatient rehab today.  We do have a bed available today.  Call me for questions.  #[phone]

## 2015-08-22 NOTE — Progress Notes (Signed)
Trish Mage, RN Rehab Admission Coordinator Signed Physical Medicine and Rehabilitation PMR Pre-admission 08/22/2015 9:52 AM  Related encounter: Admission (Current) from 08/03/2015 in [facility] 3S STEPDOWN    Expand All Collapse All   PMR Admission Coordinator Pre-Admission Assessment  Patient: Lisa [name] is an 81 y.o., female MRN: [id number] DOB: 02-19-1927 Height:  (160 cm) Weight: 67 kg (147 lb 11.3 oz)  Insurance Information HMO: No PPO: PCP: IPA: 80/20: OTHER:  PRIMARY: Medicare A/B Policy#: [id number] Subscriber: Vernell Leep CM Name: Phone#: Fax#:  Pre-Cert#: Employer: REtired Benefits: Phone #: Name: Checked in [place]. Date: 11/20/93 Deduct: $1288 Out of Pocket Max: None Life Max: unlimited CIR: 100% SNF: 100 days Outpatient: 80% Co-Pay: 20% Home Health: 100% Co-Pay: none DME: 80% Co-Pay: 20% Providers: patient's choice  SECONDARY: BCBS supp Policy#: [id number] Subscriber: Vernell Leep CM Name: Phone#: Fax#:  Pre-Cert#: Employer: Retired Benefits: Phone #: [phone] Name:  Eff. Date: Deduct: Out of Pocket Max: Life Max:  CIR: SNF:  Outpatient: Co-Pay:  Home Health: Co-Pay:  DME: Co-Pay:   Emergency Contact Information Contact Information    Name Relation Home Work Mobile   [place] [phone]     [facility]  [phone]       Current Medical History  Patient Admitting Diagnosis: TBI  History of Present Illness:A 81 y.o. female with history of HTN, spinal stenosis with BLE weakness and post laminectomy syndrome with chronic pain who was admitted on 08/03/15  after a fall in parking lot and subsequent acute traumatic SAH along frontal convexities and acute SDH with confusion and waxing and waining of mental status. She was transferred to [facility] form [facility] and was evaluated by Dr. Alphonzo Lemmings who recommended conservative management with serial CCT. Follow up CCT showed delayed development of bilateral frontal ICH hematomas with edema and continued right SDH. Hospital course complicated by wide complex tachycardia with cardio-pulmonary arrest. She was intubated on 11/16 and started on IV antibiotics due to concern of aspiration PNA and large mucous plug suctioned past intubation. Sputum culture was positive for H flu PNA.   Cardiology consulted for input on NSVT and elevated trops. She was started on IV amiodarone and lopressor as not candidate for invasive workup. 2 D echo done revealing EF 15-20% with akinesis of entire anteroseptal myocardium likely due to stress induced cardiomyopathy. Follow up echo on 11/23 showed improvement in EF to 25%. She was extubated on 11/23 and started on dysphagia 1, honey thick as alertness improved. She continues to have bouts of lethargy and follow up CT 11/27 showed overall improvement with near resolution of SAH and bifrontal parenchymal hemorrhage as well as subtle new 3 mm midline shift. Hypoxia due to ABLA has improved with transfusion of 1 unit PRBC. Patient with resultant balance deficits needing assist for sitting balance at EOB, with posterior bias with standing attempts, BLE instability requiring blocking, difficulty initiating movement--question apraxia as well as delayed processing. CIR recommended by MD and rehab team for follow up therapy.   Past Medical History  Past Medical History  Diagnosis Date  . Allergy   . Pelvis fracture (HCC)   . Wrist fracture, right   . Depression   . Spinal stenosis   . Osteoarthritis   . Hypertension   . Foot fracture     bilateral    Family  History  family history includes COPD in her father and mother; Heart disease in her father.  Prior Rehab/Hospitalizations: Had rehab after back surgery years  ago.  Has the patient had major surgery during 100 days prior to admission? No  Current Medications   Current facility-administered medications:  . 0.9 % sodium chloride infusion, , Intravenous, Continuous, Coralyn HellingVineet Sood, MD, Last Rate: 40 mL/hr at 08/22/15 0827 . acetaminophen (TYLENOL) tablet 650 mg, 650 mg, Oral, Q6H PRN, 650 mg at 08/21/15 1107 **OR** acetaminophen (TYLENOL) suppository 650 mg, 650 mg, Rectal, Q6H PRN, Coletta MemosKyle Cabbell, MD, 650 mg at 08/17/15 0817 . amiodarone (PACERONE) tablet 100 mg, 100 mg, Oral, Daily, Drema Dallasurtis J Woods, MD, 100 mg at 08/22/15 0931 . calcium-vitamin D (OSCAL WITH D) 500-200 MG-UNIT per tablet 1 tablet, 1 tablet, Oral, TID, Coletta MemosKyle Cabbell, MD, 1 tablet at 08/22/15 0932 . citalopram (CELEXA) tablet 20 mg, 20 mg, Oral, Daily, Lonia BloodJeffrey T McClung, MD, 20 mg at 08/22/15 0932 . insulin aspart (novoLOG) injection 0-9 Units, 0-9 Units, Subcutaneous, TID WC, Lonia BloodJeffrey T McClung, MD, 0 Units at 08/21/15 0800 . metoprolol (LOPRESSOR) injection 2.5-5 mg, 2.5-5 mg, Intravenous, Q3H PRN, Cyril Mourningakesh Alva V, MD, 5 mg at 08/16/15 2212 . metoprolol tartrate (LOPRESSOR) tablet 12.5 mg, 12.5 mg, Oral, BID WC, Lupita Leashouglas B McQuaid, MD, 12.5 mg at 08/22/15 0818 . multivitamin-lutein (OCUVITE-LUTEIN) capsule 1 capsule, 1 capsule, Oral, BID, Coletta MemosKyle Cabbell, MD, 1 capsule at 08/22/15 0931 . [DISCONTINUED] ondansetron (ZOFRAN) tablet 4 mg, 4 mg, Oral, Q6H PRN, 4 mg at 08/03/15 2031 **OR** ondansetron (ZOFRAN) injection 4 mg, 4 mg, Intravenous, Q6H PRN, Coletta MemosKyle Cabbell, MD, 4 mg at 08/04/15 0257 . RESOURCE THICKENUP CLEAR, , Oral, Once, Lupita Leashouglas B McQuaid, MD . sodium chloride 0.9 % injection 10-40 mL, 10-40 mL, Intracatheter, PRN, Coletta MemosKyle Cabbell, MD  Patients Current Diet: DIET - DYS 1 Room service appropriate?: Yes; Fluid consistency::  Honey Thick Diet - low sodium heart healthy  Precautions / Restrictions Precautions Precautions: Fall Restrictions Weight Bearing Restrictions: No   Has the patient had 2 or more falls or a fall with injury in the past year?Yes, has had at least 7 falls this past year with minor injuries up until current fall resulted in TBI.  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  Home Assistive Devices / Equipment Home Assistive Devices/Equipment: None  Prior Device Use: Indicate devices/aids used by the patient prior to current illness, exacerbation or injury? None. Did use a cane years ago after back surgery, but none recently.  Prior Functional Level Prior Function Level of Independence: Independent Comments: pt lives alone and does her own driving.   Self Care: Did the patient need help bathing, dressing, using the toilet or eating? Independent  Indoor Mobility: Did the patient need assistance with walking from room to room (with or without device)? Independent  Stairs: Did the patient need assistance with internal or external stairs (with or without device)? Independent  Functional Cognition: Did the patient need help planning regular tasks such as shopping or remembering to take medications? Independent  Current Functional Level Cognition  Overall Cognitive Status: No family/caregiver present to determine baseline cognitive functioning Difficult to assess due to: (minimal hoarse whispered verbalizations) Current Attention Level: Sustained Orientation Level: Oriented to person, Oriented to place, Disoriented to time, Disoriented to situation Following Commands: Follows one step commands with increased time Safety/Judgement: Decreased awareness of deficits General Comments: Difficult to assess cognition due to minimal verbalizations, but pt does nod head yes/no appropriately and follows simple directions with  delay.    Extremity Assessment (includes Sensation/Coordination)  Upper Extremity Assessment: Generalized  weakness, Difficult to assess due to impaired cognition (appears to be preferring to use R hand)  Lower Extremity Assessment: Defer to PT evaluation    ADLs  Overall ADL's : Needs assistance/impaired Eating/Feeding: NPO Grooming: Brushing hair, Sitting, Applying deodorant, Wash/dry face, Minimal assistance Grooming Details (indicate cue type and reason): Able to assist with oral care using R nondominant hand Upper Body Bathing: Set up, Supervision/ safety, Sitting Upper Body Bathing Details (indicate cue type and reason): did not wash complete UB Lower Body Bathing: Maximal assistance Upper Body Dressing : Maximal assistance Upper Body Dressing Details (indicate cue type and reason): OT assisted with gown Lower Body Dressing: Total assistance Toilet Transfer: +2 for physical assistance, Maximal assistance, Stand-pivot (from bed to chair) Toilet Transfer Details (indicate cue type and reason): simulated Toileting- Clothing Manipulation and Hygiene: Total assistance Functional mobility during ADLs: +2 for physical assistance, Maximal assistance (stand pivot to chair) General ADL Comments: Pt required assist for balance sitting EOB during ADL task.    Mobility  Overal bed mobility: Needs Assistance, +2 for physical assistance Bed Mobility: Supine to Sit Supine to sit: Max assist, +2 for physical assistance Sit to supine: Max assist, +2 for physical assistance General bed mobility comments: Pt demonstrates difficulty initiating movement but once provided tactile cues pt participates in coming EOB. A to trunk and Bil LEs and use of bed pad     Transfers  Overall transfer level: Needs assistance Equipment used: 2 person hand held assist Transfers: Sit to/from Stand, Stand Pivot Transfers Sit to Stand: +2 physical assistance, Max assist Stand pivot transfers: +2  physical assistance, Max assist General transfer comment: A to boost to standing, pt shuffles feet minimally during pivot but participates. Bil knees blocked. Pt maintains Bil knee extension rather than activiating muscles.    Ambulation / Gait / Stairs / Engineer, drilling / Balance Dynamic Sitting Balance Sitting balance - Comments: pt leans posteriorly and to Lt side  Balance Overall balance assessment: Needs assistance Sitting-balance support: Bilateral upper extremity supported, Feet supported Sitting balance-Leahy Scale: Poor Sitting balance - Comments: pt leans posteriorly and to Lt side  Postural control: Left lateral lean Standing balance support: Bilateral upper extremity supported, During functional activity Standing balance-Leahy Scale: Zero    Special needs/care consideration BiPAP/CPAP No CPM No Continuous Drip IV 0.9% NS at 40 ml/hr Dialysis No  Life Vest No Oxygen No Special Bed No Trach Size No Wound Vac (area) No  Skin Bruising left face and eye areas  Bowel mgmt: Last BM 08/20/15 Bladder mgmt: Urinary catheter Diabetic mgmt No   Previous Home Environment Living Arrangements: Alone Home Care Services: No  Discharge Living Setting Plans for Discharge Living Setting: Patient's home, Alone, Other (Comment) (Lived in condo, alone.) Type of Home at Discharge: Other (Comment) (Condo) Discharge Home Layout: Two level, Able to live on main level with bedroom/bathroom Alternate Level Stairs-Number of Steps: Flight Discharge Home Access: Stairs to enter Secretary/administrator of Steps: 3 Discharge Bathroom Shower/Tub: Walk-in shower, Other (comment) (walk in shower with a seat.) Does the patient have any problems obtaining your medications?: No  Social/Family/Support Systems Patient Roles: Parent (Has a son in [place], [place] and 64 yo granddaughter.) Contact Information: Lota Leamer - son  [phone] Anticipated Caregiver: Family, friends and hired help as needed Anticipated Industrial/product designer Information: Clerance Lav - friend [phone] Ability/Limitations of Caregiver: Son lives out of state. Has friends and family who can come into town to assist.  Son says can hire help if needed Caregiver Availability: Other (Comment) (Does not have 24/7 help at this point. 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  Patient Condition: This patient's medical and functional status has changed since the consult dated: 08/20/15 in which the Rehabilitation Physician determined and documented that the patient's condition is appropriate for intensive rehabilitative care in an inpatient rehabilitation facility. See "History of Present Illness" (above) for medical update. Functional changes are: Currently requiring Max assist +2 for stand pivot transfers. Patient's medical and functional status update has been discussed with the Rehabilitation physician and patient remains appropriate for inpatient rehabilitation. Will admit to inpatient rehab today.  Preadmission Screen  Completed By: Trish Mage, 08/22/2015 10:24 AM ______________________________________________________________________  Discussed status with Dr. Wynn Banker on 08/22/15 at 1024 and received telephone approval for admission today.  Admission Coordinator: Trish Mage time1024/Date11/30/16          Cosigned by: Erick Colace, MD at 08/22/2015 11:46 AM  Revision History     Date/Time User Provider Type Action   08/22/2015 11:46 AM Erick Colace, MD Physician Cosign   08/22/2015 10:24 AM Trish Mage, RN Rehab Admission Coordinator Sign

## 2015-08-22 NOTE — Progress Notes (Signed)
Speech Language Pathology Treatment: Dysphagia  Patient Details Name: Lisa SnellenFaye K [name] MRN: [id number] DOB: 01/25/1927 Today's Date: 08/22/2015 Time: 0900-0920 SLP Time Calculation (min) (ACUTE ONLY): 20 min  Assessment / Plan / Recommendation Clinical Impression  Pt seen during am meal with son.  Cognition and probably vocal fold irritation following intubation continue to impact safety with PO.  Reinforced necessity of upright posture as pt observed to be laying almost flat in bed being fed. Moderate verbal cues provided for initiation and sustained attention during self feeding. Though pt more verbally responsive on the phone on a social call, she does not initiate conversation or requests. Verbal cues also needed to reduce oral holding with all textures. Second swallow strategy implemented due to concern for oral/oropharyngeal residuals given intermittent delayed hard coughing. Pt will benefit from f/u at CIR.     HPI HPI: 81 year old female presents after a fall in a parking lot sustaining Bil Frontal Cerebral Contusions.Pt was initially evaluated 11/16 with recommendation for NPO, but then began to have tachycardia and respiratory decline with encephalopathy, pulmonary edema, and cardiomyopathy. she was intubated 11/16-11/23.  pt with hx of HTN, Bil "Foot" fxs, Pelvic fxs, R Wrist fx, Back surgeries, and Depression.       SLP Plan  Continue with current plan of care     Recommendations  Diet recommendations: Dysphagia 1 (puree);Honey-thick liquid Liquids provided via: Cup;Straw Medication Administration: Crushed with puree Supervision: Full supervision/cueing for compensatory strategies;Staff to assist with self feeding Compensations: Small sips/bites;Effortful swallow;Multiple dry swallows after each bite/sip Postural Changes and/or Swallow Maneuvers: Seated upright 90 degrees              General recommendations: Rehab consult Oral Care Recommendations: Oral care BID Follow up  Recommendations: Inpatient Rehab Plan: Continue with current plan of care  [facility]oscopy Centers LLCBonnie Alara Daniel, MA CCC-SLP [phone]  Claudine MoutonDeBlois, Telisa Ohlsen Caroline 08/22/2015, 9:56 AM

## 2015-08-22 NOTE — Interval H&P Note (Signed)
Lisa [name] was admitted today to Inpatient Rehabilitation with the diagnosis of cognitive deficits and gait disorder secondary to traumatic frontal ICH,SAH and SDH.  [name] history has been reviewed, patient examined, and there is no change in status.  Patient continues to be appropriate for intensive inpatient rehabilitation.  I have reviewed the patient's chart and labs.  Questions were answered to the patient's satisfaction. The PAPE has been reviewed and assessment remains appropriate.  Lisa [name],Lisa [name] E 08/22/2015, 3:43 PM

## 2015-08-22 NOTE — Progress Notes (Addendum)
[facility] Medical Consultation  Lisa [name] NWG:[id number] DOB: 1927-02-05 DOA: 08/03/2015 PCP: Rozanna Box, MD   Requesting physician: [facility] Date of consultation: 07-11-2015 Reason for consultation: Multiple medical problems  Impression/Recommendations Principal Problem:   Subdural hematoma (HCC) Active Problems:   Acute respiratory failure with hypoxemia (HCC)   Hypernatremia   Pressure ulcer   Boil of upper extremity   Pneumonia due to Haemophilus influenzae (HCC)   Chronic systolic CHF (congestive heart failure) (HCC)   Acute on chronic renal failure (HCC)   Hypokalemia   Bright red blood per rectum   Dysphagia   Hypomagnesemia   SDH (subdural hematoma) (HCC)   Abscess     Medication List    STOP taking these medications        atenolol 50 MG tablet  Commonly known as:  TENORMIN     irbesartan 300 MG tablet  Commonly known as:  AVAPRO      TAKE these medications        amiodarone 100 MG tablet  Commonly known as:  PACERONE  Take 1 tablet (100 mg total) by mouth daily.     aspirin EC 81 MG tablet  Take 81 mg by mouth daily.     calcium-vitamin D 500-200 MG-UNIT tablet  Commonly known as:  OSCAL WITH D  Take 1 tablet by mouth 3 (three) times daily.     cholecalciferol 1000 UNITS tablet  Commonly known as:  VITAMIN D  Take 1,000 Units by mouth daily.     citalopram 20 MG tablet  Commonly known as:  CELEXA  Take 1 tablet (20 mg total) by mouth daily.     doxycycline 50 MG capsule  Commonly known as:  VIBRAMYCIN  Take 1 capsule (50 mg total) by mouth 2 (two) times daily.     furosemide 20 MG tablet  Commonly known as:  LASIX  Take 1 tablet (20 mg total) by mouth daily.     HYDROcodone-acetaminophen 5-325 MG tablet  Commonly known as:  NORCO/VICODIN  Take 1 tablet by mouth 4 (four) times daily as needed for moderate pain.     ICAPS AREDS 2 Caps  Take 1 capsule by mouth 2 (two) times daily.     metoprolol  tartrate 25 MG tablet  Commonly known as:  LOPRESSOR  Take 0.5 tablets (12.5 mg total) by mouth 2 (two) times daily with a meal.         Subdural hemorrhage, sub arachnoid hemorrhage > neuro function slowly improving -Per neurosurgery -PT/OT; recommends [facility] -[facility] to tx patient to their service today  -Much more awake today  -Not at baseline per son. 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    Acute hypoxic respiratory failure 2nd to Aspiration pneumonia/Haemophilus influenzae pneumonia -Completed 5 days antibiotics, dc on 11/21  -Aspiration precautions -Monitor O2 saturation, now on RA   Acute respiratory alkalosis -11/27 head CT; improved  Respiratory vs cardiac arrest with ventricular tachycardia vs SVT with aberration with underlying LBBB. most likely SVT  not a good candidate for an invasive cardiac workup at this point per cardiology note on 11/19 On  Amiodarone 100 mg daily  -Continue Metoprolol 12.5 mg BID  -Cardiology sign off 11/19, will need follow up with them if she is discharged Will add lasix 20 mg / day as BP now improved    Chronic Systolic CHF/Hypotension -See wide-complex tachycardia,LV EF:  25%, may add ARB in the next few days if BP stable and renal fn stable  -Transfuse for involving<8, hg now 10 -11/27 transfused 1 unit PRBC -Strict I&O since admission +14.5 L -Daily weight; Admission weight= 65.7 kg ,              11/30 weight=67kg     Acute on chronic renal failure - baseline 1.4 -At baseline continue to monitor  Hypokalemia -Potassium goal > 4    Hypomagnesemia -Magnesium goal> 2 Check bmp, mag q 72 hrs    Dysphagia -Diet dysphagia 1 honey thick liquid  Anemia F/u CBC  Check iron studies  Boil/right arm abscess? -Obtain soft tissue x-ray right forearm; nondiagnostic -Place K Pad; Apply to right forearm over boil.  Will add doxycycline for 5  days    Goals of care -Patient stable for discharge to CIR 11/30    -----------------------------------------------------------------------------------------------------------------------------------------    HPI:  Ms. Maciver was admitted after falling and striking her head. She was alert on transfer, but confused. She was moving all extremities upon admission and would follow some commands. Her mental status declined over the next few days, but this coincided with evolution and enlargement of the hematomas located bilaterally in the frontal lobes. At no time was surgical decompression of the cranial contents indicated. She appeared to be slowly improving then had a cardiorespiratory setback. Ms. Miranda developed a wide complex tachycardic rhythm requiring shock x2 and an amiodarone injection to break the arrhythmia. After evaluation by cardiology this was felt to be a Takatsubo episode. She was intubated emergently and was stable on the ventilator. Slowly her neurologic exam improved and she was weaned from the ventilator. Ms. Ostenson did undergo an echocardiogram which revealed 15-20% ejection fraction. While there was discussion to withdraw treatment, with the neurologic improvement the question of support was moot. She was extubated and transferred to stepdown. At this time Rehab would like to transfer her to inpatient rehab. Ms. Haddox was also treated for pneumonia with Haemophilius , 5 day treatment with ABx    Consultants: Dr.Douglas B McQuaid PCCM Dr.[facility] S [facility]   Procedure/Significant Events: 11/13 head CT >> stable BILATERAL frontal intracerebral hematomas, surrounding edema is more prominent  11/16 Echo >> EF 15%, RVSP 65 11/23 Echo >> EF 25%  SIGNIFICANT EVENTS: 11/11 fall with sah/sdh 11/16 Wide complex tachycardia, cardioversion x 2 11/16 intubated 11/17 runs of tachy on amio 11/23 echocardiogram;- Left ventricle: Moderate basal septal hypertrophy. Septal  apical and distal anterior wall akinesis Inferior wall hypokinesis -LVEF=25%. 11/25 passed modified barium swallow test 11/26 right forearm x-rays; negative osteomyelitis, positive soft tissue swelling; cellulitis/dermatitis 11/27 PCXR; Worsening bibasilar opacification likely small to moderate left effusion and small right effusion with associated atelectasis. -Mild stable cardiomegaly and possible minimal vascular congestion. 11/27 transfused 1 unit PRBC    Culture 11/11 MRSA by PCR negative 11/16 tracheal aspirate positive Haemophilus influenza   Antibiotics: Unasyn 11/16>> 11/21    DVT prophylaxis; SCD  Lines ETT 11/16>> 11/23    Past Medical History  Diagnosis Date  . Allergy   . Pelvis fracture (HCC)   . Wrist fracture, right   . Depression   . Spinal stenosis   . Osteoarthritis   . Hypertension   . Foot fracture     bilateral   Past Surgical History  Procedure Laterality Date  . Back surgery N/A   . Breast lumpectomy N/A    Social History:  reports that she has never smoked. She  has never used smokeless tobacco. She reports that she does not drink alcohol or use illicit drugs.  Allergies  Allergen Reactions  . Ambien [Zolpidem Tartrate] Other (See Comments)    Reaction:  Hallucinations   . Oxycodone Nausea And Vomiting  . Shellfish Allergy Swelling and Other (See Comments)    Pts mouth and face swells.   . Ace Inhibitors Cough  . Daypro [Oxaprozin] Other (See Comments)    GI upset  . Norvasc [Amlodipine Besylate] Palpitations   Family History  Problem Relation Age of Onset  . COPD Mother   . COPD Father   . Heart disease Father     Prior to Admission medications   Medication Sig Start Date End Date Taking? Authorizing Provider  atenolol (TENORMIN) 50 MG tablet Take 50 mg by mouth daily.    Yes Historical Provider, MD  citalopram (CELEXA) 40 MG tablet Take 40 mg by mouth daily.   Yes Historical Provider, MD  HYDROcodone-acetaminophen  (NORCO/VICODIN) 5-325 MG tablet Take 1 tablet by mouth 4 (four) times daily as needed for moderate pain.   Yes Historical Provider, MD  irbesartan (AVAPRO) 300 MG tablet Take 150 mg by mouth daily.    Yes Historical Provider, MD  aspirin EC 81 MG tablet Take 81 mg by mouth daily.    Historical Provider, MD  calcium-vitamin D (OSCAL WITH D) 500-200 MG-UNIT tablet Take 1 tablet by mouth 3 (three) times daily.    Historical Provider, MD  cholecalciferol (VITAMIN D) 1000 UNITS tablet Take 1,000 Units by mouth daily.    Historical Provider, MD  Multiple Vitamins-Minerals (ICAPS AREDS 2) CAPS Take 1 capsule by mouth 2 (two) times daily.     Historical Provider, MD   Physical Exam: Blood pressure 146/65, pulse 88, temperature 98.1 F (36.7 C), temperature source Oral, resp. rate 20, height  (1.6 m), weight 67 kg (147 lb 11.3 oz), SpO2 98 %. Filed Vitals:   08/22/15 0345 08/22/15 0700 08/22/15 0713 08/22/15 0823  BP: 151/65  146/65   Pulse: 90  87 88  Temp:  98.1 F (36.7 C)    TempSrc:  Oral    Resp: Height:    (1.6 m)   Weight:   67 kg (147 lb 11.3 oz)   SpO2: 96%  97% 98%     General: A/O 3, (does not know why), is very weak, negative acute respiratory distress Eyes: Negative headache, eye pain, negative scleral hemorrhage ENT: Negative Runny nose, negative ear pain, negative gingival bleeding, Neck:  Negative scars, masses, torticollis, lymphadenopathy, JVD Lungs: Clear to auscultation bilaterally without wheezes or crackles Cardiovascular: Regular rhythm and rate without murmur gallop or rub normal S1 and S2 Abdomen:negative abdominal pain, nondistended, positive soft, bowel sounds, no rebound, no ascites, no appreciable mass Extremities: No significant cyanosis, clubbing, or edema bilateral lower extremities. Right forearm boil/abscess positive erythema~2.5 inch in diameter (stable overnight), negative warmth to touch, negative pain to palpation Psychiatric: Unable  to fully assess Neurologic:  Cranial nerves II through XII intact, tongue/uvula midline, all extremities muscle strength 5/5, sensation intact throughout, finger to nose to finger WNL bilateral, negative dysarthria, negative expressive aphasia, negative receptive aphasia.   Labs on Admission:  Basic Metabolic Panel:  Recent Labs Lab 08/17/15 0425 08/18/15 0307 08/19/15 0330 08/20/15 0545 08/21/15 0424  NA 143 143 142 139 138  K 3.4* 3.7 4.0 3.8 3.8  CL 111 115* 110 105 105  CO2 23 19* 21* 25  23  GLUCOSE 131* 90 123* 104* 95  BUN 45* 41* 32* 32* 32*  CREATININE 1.35* 1.21* 1.17* 1.36* 1.41*  CALCIUM 8.6* 8.5* 8.7* 8.3* 8.4*  MG  --   --  1.6* 2.2 1.9   Liver Function Tests: No results for input(s): AST, ALT, ALKPHOS, BILITOT, PROT, ALBUMIN in the last 168 hours. No results for input(s): LIPASE, AMYLASE in the last 168 hours. No results for input(s): AMMONIA in the last 168 hours. CBC:  Recent Labs Lab 08/16/15 0322 08/17/15 0425 08/18/15 0307 08/19/15 0330 08/21/15 0424  WBC 16.9* 15.4* 11.9* 9.6 10.2  NEUTROABS  --   --  10.4* 8.1*  --   HGB 7.8* 8.2* 8.4* 7.9* 10.0*  HCT 25.1* 26.6* 26.5* 25.4* 30.3*  MCV 94.4 95.3 96.7 96.6 91.3  PLT 275 329 334 354 311   Cardiac Enzymes: No results for input(s): CKTOTAL, CKMB, CKMBINDEX, TROPONINI in the last 168 hours. BNP: Invalid input(s): POCBNP CBG:  Recent Labs Lab 08/21/15 1249 08/21/15 1510 08/21/15 1914 08/21/15 2258 08/22/15 0345  GLUCAP 111* 98 94 122* 88    Radiological Exams on Admission: Dg Chest Port 1 View  08/21/2015  CLINICAL DATA:  Recent congestive heart failure. Diminished breath sounds EXAM: PORTABLE CHEST 1 VIEW COMPARISON:  August 19, 2015 FINDINGS: There has been interval clearing of opacity from the left base. Currently there is mild left base atelectasis. There is no frank edema or consolidation. Heart remains slightly enlarged with pulmonary vascularity currently within normal limits. No  adenopathy. Central catheter tip is at the cavoatrial junction. No pneumothorax. IMPRESSION: Interval clearing left base. There is now mild atelectasis in this region. Currently no edema or consolidation. Stable cardiac prominence. No pneumothorax. Electronically Signed   By: Bretta BangWilliam  Woodruff III M.D.   On: 08/21/2015 12:53    EKG:   Care during the described time interval was provided by me .  I have reviewed this patient's available data, including medical history, events of note, physical examination, and all test results as part of my evaluation. I have personally reviewed and interpreted all radiology studies.  Time spent: 40 minutes  [facility] LLCBROL,Kaleiyah Polsky [facility] Pager [phone]  If 7PM-7AM, please contact night-coverage [email or web address] Password [email or web address] 08/22/2015, 9:59 AM

## 2015-08-22 NOTE — PMR Pre-admission (Signed)
PMR Admission Coordinator Pre-Admission Assessment  Patient: Lisa [name] is an 81 y.o., female MRN: [id number] DOB: Sep 27, 1926 Height:  (160 cm) Weight: 67 kg (147 lb 11.3 oz)              Insurance Information HMO:  No    PPO:       PCP:       IPA:       80/20:       OTHER:   PRIMARY: Medicare A/B      Policy#: [id number]      Subscriber:  Vernell Leep CM Name:        Phone#:       Fax#:   Pre-Cert#:        Employer: REtired Benefits:  Phone #:       Name: Checked in Rafael Hernandez. Date: 11/20/93     Deduct:  $1288      Out of Pocket Max: None      Life Max: unlimited CIR: 100%      SNF: 100 days Outpatient: 80%     Co-Pay: 20% Home Health: 100%      Co-Pay: none DME: 80%     Co-Pay: 20% Providers: patient's choice  SECONDARY:  BCBS supp      Policy#: [id number]      Subscriber:  Vernell Leep CM Name:        Phone#:       Fax#:   Pre-Cert#:        Employer: Retired Benefits:  Phone #: [phone]     Name:   Eff. Date:       Deduct:        Out of Pocket Max:        Life Max:   CIR:        SNF:   Outpatient:       Co-Pay:   Home Health:        Co-Pay:   DME:       Co-Pay:    Emergency Contact Information Contact Information    Name Relation Home Work Mobile   [place] [phone]     [facility]  [phone]       Current Medical History  Patient Admitting Diagnosis:  TBI  History of Present Illness:A 81 y.o. female with history of HTN, spinal stenosis with BLE weakness and post laminectomy syndrome with chronic pain who was admitted on 08/03/15 after a fall in parking lot and subsequent acute traumatic SAH along frontal convexities and acute SDH with confusion and waxing and waining of mental status. She was transferred to [facility] and was evaluated by Dr. Alphonzo Lemmings who recommended conservative management with serial CCT. Follow up CCT showed delayed development of bilateral frontal ICH hematomas with edema and continued right SDH. Hospital course  complicated by wide complex tachycardia with cardio-pulmonary arrest. She was intubated on 11/16 and started on IV antibiotics due to concern of aspiration PNA and large mucous plug suctioned past intubation. Sputum culture was positive for H flu PNA.   Cardiology consulted for input on NSVT and elevated trops. She was started on IV amiodarone and lopressor as not candidate for invasive workup. 2 D echo done revealing EF 15-20% with akinesis of entire anteroseptal myocardium likely due to stress induced cardiomyopathy. Follow up echo on 11/23 showed improvement in EF to 25%. She was extubated on 11/23 and started on dysphagia 1, honey thick as alertness improved. She continues to have bouts of lethargy  and follow up CT 11/27 showed overall improvement with near resolution of SAH and bifrontal parenchymal hemorrhage as well as subtle new 3 mm midline shift. Hypoxia due to ABLA has improved with transfusion of 1 unit PRBC. Patient with resultant balance deficits needing assist for sitting balance at EOB, with posterior bias with standing attempts, BLE instability requiring blocking, difficulty initiating movement--question apraxia as well as delayed processing. CIR recommended by MD and rehab team for follow up therapy.   Past Medical History  Past Medical History  Diagnosis Date  . Allergy   . Pelvis fracture (HCC)   . Wrist fracture, right   . Depression   . Spinal stenosis   . Osteoarthritis   . Hypertension   . Foot fracture     bilateral    Family History  family history includes COPD in her father and mother; Heart disease in her father.  Prior Rehab/Hospitalizations: Had rehab after back surgery years ago.  Has the patient had major surgery during 100 days prior to admission? No  Current Medications   Current facility-administered medications:  .  0.9 %  sodium chloride infusion, , Intravenous, Continuous, Coralyn Helling, MD, Last Rate: 40 mL/hr at 08/22/15 0827 .   acetaminophen (TYLENOL) tablet 650 mg, 650 mg, Oral, Q6H PRN, 650 mg at 08/21/15 1107 **OR** acetaminophen (TYLENOL) suppository 650 mg, 650 mg, Rectal, Q6H PRN, Coletta Memos, MD, 650 mg at 08/17/15 0817 .  amiodarone (PACERONE) tablet 100 mg, 100 mg, Oral, Daily, Drema Dallas, MD, 100 mg at 08/22/15 0931 .  calcium-vitamin D (OSCAL WITH D) 500-200 MG-UNIT per tablet 1 tablet, 1 tablet, Oral, TID, Coletta Memos, MD, 1 tablet at 08/22/15 0932 .  citalopram (CELEXA) tablet 20 mg, 20 mg, Oral, Daily, Lonia Blood, MD, 20 mg at 08/22/15 0932 .  insulin aspart (novoLOG) injection 0-9 Units, 0-9 Units, Subcutaneous, TID WC, Lonia Blood, MD, 0 Units at 08/21/15 0800 .  metoprolol (LOPRESSOR) injection 2.5-5 mg, 2.5-5 mg, Intravenous, Q3H PRN, Cyril Mourning V, MD, 5 mg at 08/16/15 2212 .  metoprolol tartrate (LOPRESSOR) tablet 12.5 mg, 12.5 mg, Oral, BID WC, Lupita Leash, MD, 12.5 mg at 08/22/15 0818 .  multivitamin-lutein (OCUVITE-LUTEIN) capsule 1 capsule, 1 capsule, Oral, BID, Coletta Memos, MD, 1 capsule at 08/22/15 0931 .  [DISCONTINUED] ondansetron (ZOFRAN) tablet 4 mg, 4 mg, Oral, Q6H PRN, 4 mg at 08/03/15 2031 **OR** ondansetron (ZOFRAN) injection 4 mg, 4 mg, Intravenous, Q6H PRN, Coletta Memos, MD, 4 mg at 08/04/15 0257 .  RESOURCE THICKENUP CLEAR, , Oral, Once, Lupita Leash, MD .  sodium chloride 0.9 % injection 10-40 mL, 10-40 mL, Intracatheter, PRN, Coletta Memos, MD  Patients Current Diet: DIET - DYS 1 Room service appropriate?: Yes; Fluid consistency:: Honey Thick Diet - low sodium heart healthy  Precautions / Restrictions Precautions Precautions: Fall Restrictions Weight Bearing Restrictions: No   Has the patient had 2 or more falls or a fall with injury in the past year?Yes, has had at least 7 falls this past year with minor injuries up until current fall resulted in TBI.  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  Home Assistive Devices / Equipment Home Assistive Devices/Equipment: None  Prior Device Use: Indicate devices/aids used by the patient prior to current illness, exacerbation or injury? None.  Did use a cane years ago after back surgery, but none recently.  Prior Functional Level Prior Function Level of  Independence: Independent Comments: pt lives alone and does her own driving.    Self Care: Did the patient need help bathing, dressing, using the toilet or eating?  Independent  Indoor Mobility: Did the patient need assistance with walking from room to room (with or without device)? Independent  Stairs: Did the patient need assistance with internal or external stairs (with or without device)? Independent  Functional Cognition: Did the patient need help planning regular tasks such as shopping or remembering to take medications? Independent  Current Functional Level Cognition  Overall Cognitive Status: No family/caregiver present to determine baseline cognitive functioning Difficult to assess due to:  (minimal hoarse whispered verbalizations) Current Attention Level: Sustained Orientation Level: Oriented to person, Oriented to place, Disoriented to time, Disoriented to situation Following Commands: Follows one step commands with increased time Safety/Judgement: Decreased awareness of deficits General Comments: Difficult to assess cognition due to minimal verbalizations, but pt does nod head yes/no appropriately and follows simple directions with delay.      Extremity Assessment (includes Sensation/Coordination)  Upper Extremity Assessment: Generalized weakness, Difficult to assess due to impaired cognition (appears to be preferring to use R hand)  Lower Extremity Assessment: Defer to PT evaluation    ADLs  Overall ADL's : Needs assistance/impaired Eating/Feeding: NPO Grooming: Brushing hair, Sitting, Applying deodorant, Wash/dry face, Minimal assistance Grooming  Details (indicate cue type and reason): Able to assist with oral care using R nondominant hand Upper Body Bathing: Set up, Supervision/ safety, Sitting Upper Body Bathing Details (indicate cue type and reason): did not wash complete UB Lower Body Bathing: Maximal assistance Upper Body Dressing : Maximal assistance Upper Body Dressing Details (indicate cue type and reason): OT assisted with gown Lower Body Dressing: Total assistance Toilet Transfer: +2 for physical assistance, Maximal assistance, Stand-pivot (from bed to chair) Toilet Transfer Details (indicate cue type and reason): simulated Toileting- Clothing Manipulation and Hygiene: Total assistance Functional mobility during ADLs: +2 for physical assistance, Maximal assistance (stand pivot to chair) General ADL Comments: Pt required assist for balance sitting EOB during ADL task.    Mobility  Overal bed mobility: Needs Assistance, +2 for physical assistance Bed Mobility: Supine to Sit Supine to sit: Max assist, +2 for physical assistance Sit to supine: Max assist, +2 for physical assistance General bed mobility comments: Pt demonstrates difficulty initiating movement but once provided tactile cues pt participates in coming EOB.  A to trunk and Bil LEs and use of bed pad     Transfers  Overall transfer level: Needs assistance Equipment used: 2 person hand held assist Transfers: Sit to/from Stand, Stand Pivot Transfers Sit to Stand: +2 physical assistance, Max assist Stand pivot transfers: +2 physical assistance, Max assist General transfer comment: A to boost to standing, pt shuffles feet minimally during pivot but participates.  Bil knees blocked.  Pt maintains Bil knee extension rather than activiating muscles.    Ambulation / Gait / Stairs / Engineer, drillingWheelchair Mobility       Posture / Balance Dynamic Sitting Balance Sitting balance - Comments: pt leans posteriorly and to Lt side  Balance Overall balance assessment: Needs  assistance Sitting-balance support: Bilateral upper extremity supported, Feet supported Sitting balance-Leahy Scale: Poor Sitting balance - Comments: pt leans posteriorly and to Lt side  Postural control: Left lateral lean Standing balance support: Bilateral upper extremity supported, During functional activity Standing balance-Leahy Scale: Zero    Special needs/care consideration BiPAP/CPAP No CPM No Continuous Drip IV 0.9% NS at 40 ml/hr Dialysis No  Life Vest No Oxygen No Special Bed No Trach Size No Wound Vac (area) No     Skin Bruising left face and eye areas                             Bowel mgmt: Last BM 08/20/15 Bladder mgmt: Urinary catheter Diabetic mgmt No   Previous Home Environment Living Arrangements: Alone Home Care Services: No  Discharge Living Setting Plans for Discharge Living Setting: Patient's home, Alone, Other (Comment) (Lived in condo, alone.) Type of Home at Discharge: Other (Comment) (Condo) Discharge Home Layout: Two level, Able to live on main level with bedroom/bathroom Alternate Level Stairs-Number of Steps: Flight Discharge Home Access: Stairs to enter Secretary/administrator of Steps: 3 Discharge Bathroom Shower/Tub: Walk-in shower, Other (comment) (walk in shower with a seat.) Does the patient have any problems obtaining your medications?: No  Social/Family/Support Systems Patient Roles: Parent (Has a son in [place], [place] and 23 yo granddaughter.) Contact Information: Vester T[place]worth - son [phone] Anticipated Caregiver: Family, friends and hired help as needed Anticipated Industrial/product designer Information: Clerance Lav - friend [phone] Ability/Limitations of Caregiver: Son lives out of state.  Has friends and family who can come into town to assist.  Son says can hire help if needed Caregiver Availability: Other (Comment) (Does not have 24/7 help at this point.  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  Patient Condition: This patient's medical and functional status has changed since the consult dated: 08/20/15 in which the Rehabilitation Physician determined and documented that the patient's condition is appropriate for intensive rehabilitative care in an inpatient rehabilitation facility. See "History of Present Illness" (above) for medical update. Functional changes are: Currently requiring Max assist +2 for stand pivot transfers. Patient's medical and functional status update has been discussed with the Rehabilitation physician and patient remains appropriate for inpatient rehabilitation. Will admit to inpatient rehab today.  Preadmission Screen Completed By:  Trish Mage, 08/22/2015 10:24 AM ______________________________________________________________________   Discussed status with Dr. Wynn Banker on 08/22/15 at 1024 and received telephone approval for admission today.  Admission Coordinator:  Trish Mage, time1024/Date11/30/16

## 2015-08-23 ENCOUNTER — Inpatient Hospital Stay (HOSPITAL_COMMUNITY): Payer: Medicare Other | Admitting: Occupational Therapy

## 2015-08-23 ENCOUNTER — Inpatient Hospital Stay (HOSPITAL_COMMUNITY): Payer: Medicare Other | Admitting: Physical Therapy

## 2015-08-23 ENCOUNTER — Inpatient Hospital Stay (HOSPITAL_COMMUNITY): Payer: Medicare Other | Admitting: Speech Pathology

## 2015-08-23 DIAGNOSIS — S069X3S Unspecified intracranial injury with loss of consciousness of 1 hour to 5 hours 59 minutes, sequela: Secondary | ICD-10-CM

## 2015-08-23 DIAGNOSIS — L03113 Cellulitis of right upper limb: Secondary | ICD-10-CM

## 2015-08-23 DIAGNOSIS — D62 Acute posthemorrhagic anemia: Secondary | ICD-10-CM

## 2015-08-23 DIAGNOSIS — R1310 Dysphagia, unspecified: Secondary | ICD-10-CM

## 2015-08-23 DIAGNOSIS — R7989 Other specified abnormal findings of blood chemistry: Secondary | ICD-10-CM

## 2015-08-23 LAB — COMPREHENSIVE METABOLIC PANEL
ALT: 38 U/L (ref 14–54)
AST: 41 U/L (ref 15–41)
Albumin: 2 g/dL — ABNORMAL LOW (ref 3.5–5.0)
Alkaline Phosphatase: 67 U/L (ref 38–126)
Anion gap: 7 (ref 5–15)
BUN: 17 mg/dL (ref 6–20)
CHLORIDE: 106 mmol/L (ref 101–111)
CO2: 25 mmol/L (ref 22–32)
CREATININE: 1.08 mg/dL — AB (ref 0.44–1.00)
Calcium: 8.6 mg/dL — ABNORMAL LOW (ref 8.9–10.3)
GFR, EST AFRICAN AMERICAN: 52 mL/min — AB (ref >60)
GFR, EST NON AFRICAN AMERICAN: 45 mL/min — AB (ref >60)
Glucose, Bld: 104 mg/dL — ABNORMAL HIGH (ref 65–99)
Potassium: 3.7 mmol/L (ref 3.5–5.1)
Sodium: 138 mmol/L (ref 135–145)
Total Bilirubin: 1 mg/dL (ref 0.3–1.2)
Total Protein: 5.1 g/dL — ABNORMAL LOW (ref 6.5–8.1)

## 2015-08-23 LAB — CBC WITH DIFFERENTIAL/PLATELET
Basophils Absolute: 0 10*3/uL (ref 0.0–0.1)
Basophils Relative: 0 %
EOS PCT: 3 %
Eosinophils Absolute: 0.2 10*3/uL (ref 0.0–0.7)
HCT: 28.1 % — ABNORMAL LOW (ref 36.0–46.0)
Hemoglobin: 8.9 g/dL — ABNORMAL LOW (ref 12.0–15.0)
LYMPHS ABS: 0.7 10*3/uL (ref 0.7–4.0)
LYMPHS PCT: 10 %
MCH: 30.3 pg (ref 26.0–34.0)
MCHC: 31.7 g/dL (ref 30.0–36.0)
MCV: 95.6 fL (ref 78.0–100.0)
MONO ABS: 0.6 10*3/uL (ref 0.1–1.0)
MONOS PCT: 8 %
Neutro Abs: 6.1 10*3/uL (ref 1.7–7.7)
Neutrophils Relative %: 79 %
PLATELETS: 289 10*3/uL (ref 150–400)
RBC: 2.94 MIL/uL — AB (ref 3.87–5.11)
RDW: 17.3 % — AB (ref 11.5–15.5)
WBC: 7.6 10*3/uL (ref 4.0–10.5)

## 2015-08-23 LAB — GLUCOSE, CAPILLARY
Glucose-Capillary: 95 mg/dL (ref 65–99)
Glucose-Capillary: 133 mg/dL — ABNORMAL HIGH (ref 65–99)
Glucose-Capillary: 72 mg/dL (ref 65–99)
Glucose-Capillary: 140 mg/dL — ABNORMAL HIGH (ref 65–99)

## 2015-08-23 MED ORDER — SODIUM CHLORIDE 0.9 % IJ SOLN
10.0000 mL | INTRAMUSCULAR | Status: DC | PRN
  Administered 2015-08-23: 10 mL

## 2015-08-23 MED ORDER — HYDROCODONE-ACETAMINOPHEN 5-325 MG PO TABS
1.0000 | ORAL_TABLET | Freq: Four times a day (QID) | ORAL | Status: DC | PRN
  Administered 2015-08-23 – 2015-09-12 (×34): 1 via ORAL
  Filled 2015-08-23 (×35): qty 1

## 2015-08-23 MED ORDER — HYDROCODONE-ACETAMINOPHEN 5-325 MG PO TABS
1.0000 | ORAL_TABLET | Freq: Two times a day (BID) | ORAL | Status: DC
  Administered 2015-08-23 – 2015-08-28 (×11): 1 via ORAL
  Filled 2015-08-23 (×13): qty 1

## 2015-08-23 MED ORDER — PANTOPRAZOLE SODIUM 40 MG PO TBEC
40.0000 mg | DELAYED_RELEASE_TABLET | Freq: Every day | ORAL | Status: DC
  Administered 2015-08-23 – 2015-08-29 (×6): 40 mg via ORAL
  Filled 2015-08-23 (×6): qty 1

## 2015-08-23 MED ORDER — DOXYCYCLINE HYCLATE 100 MG PO TABS
100.0000 mg | ORAL_TABLET | Freq: Two times a day (BID) | ORAL | Status: DC
  Administered 2015-08-23 – 2015-08-26 (×7): 100 mg via ORAL
  Filled 2015-08-23 (×7): qty 1

## 2015-08-23 MED ORDER — HYDROCODONE-ACETAMINOPHEN 5-325 MG PO TABS: 1.0000 | ORAL_TABLET | Freq: Three times a day (TID) | ORAL | Status: DC | PRN

## 2015-08-23 NOTE — Progress Notes (Signed)
NT reported to RN that pt pulled out PICC line. RN assessed pt and found PICC line removed from RUE. RN paged IV team and notified Deatra Inaan Angiulli, PA. Ordered to start an IV and continue fluids and they will reestablish PICC tomorrow. IV team nurse said that all of PICC was removed from pt and placed a dressing over the site. IV and fluids restarted in the left hand. Will monitor. Rudie MeyerEurillo, Emika Tiano A, RN

## 2015-08-23 NOTE — Progress Notes (Signed)
Right forearm with indurated quarter size area--looks like resolving cellulitis question due to IV infiltration. Is non-tender to touch and son reports that area is looking better. Doxycyline recommended by Dr Einar GradAbrol--will initiate as slow to resolve and X ray forearm with question of cellulitis.

## 2015-08-23 NOTE — Evaluation (Signed)
Speech Language Pathology Assessment and Plan  Patient Details  Name: Lisa [name] MRN: [id number] Date of Birth: 10-17-1926  SLP Diagnosis: Cognitive Impairments;Dysphagia  Rehab Potential: Excellent ELOS: 21-24 days     Today's Date: 08/23/2015 SLP Individual Time: 1100-1200 SLP Individual Time Calculation (min): 60 min   Problem List:  Patient Active Problem List   Diagnosis Date Noted  . TBI (traumatic brain injury) ([place]) 08/22/2015  . ICH (intracerebral hemorrhage) ([place])   . Abscess   . Hypomagnesemia   . SDH (subdural hematoma) ([place])   . Boil of upper extremity   . Pneumonia due to Haemophilus influenzae ([place])   . Chronic systolic CHF (congestive heart failure) ([place])   . Acute on chronic renal failure ([place])   . Hypokalemia   . Bright red blood per rectum   . Dysphagia   . Pressure ulcer 08/16/2015  . Acute respiratory failure with hypoxemia ([place])   . Hypernatremia   . Subdural hematoma ([place]) 08/03/2015  . Lumbar radiculopathy 06/06/2015  . Spinal stenosis, lumbar region, with neurogenic claudication 04/02/2015  . Degenerative lumbar disc 02/15/2015  . Lumbar post-laminectomy syndrome 02/15/2015  . Facet syndrome, lumbar 02/15/2015  . Sacroiliac joint dysfunction 02/15/2015   Past Medical History:  Past Medical History  Diagnosis Date  . Allergy   . Pelvis fracture ([place])   . Wrist fracture, right   . Depression   . Spinal stenosis   . Osteoarthritis   . Hypertension   . Foot fracture     bilateral   Past Surgical History:  Past Surgical History  Procedure Laterality Date  . Back surgery N/A   . Breast lumpectomy N/A     Assessment / Plan / Recommendation Clinical Impression Patient is a 81 y.o. left handed female with history of HTN, depression, spinal stenosis with BLE weakness and post laminectomy syndrome with chronic pain who was admitted on 08/03/15 after a fall in parking lot and subsequent acute traumatic SAH along frontal convexities and acute  SDH  with confusion and waxing and waning of mental status. She was transferred to [facility] and was evaluated by Dr. Arneta Cliche who recommended conservative management with serial CCT. Follow up CCT showed delayed development of bilateral frontal ICH hematomas with edema and continued right SDH.  Hospital course complicated by wide complex tachycardia with cardio-pulmonary arrest. She was intubated on 11/16 and started on IV antibiotics due to concern of aspiration PNA and large mucous plug suctioned past intubation. Sputum culture was positive for H flu PNA.  Cardiology consulted for input on NSVT and elevated trops.  She was started on IV amiodarone and lopressor as not candidate for invasive workup. 2 D echo done revealing EF 15-20% with akinesis of entire anteroseptal myocardium likely due to stress induced cardiomyopathy.  Follow up echo on 11/23 showed improvement in EF to 25%.  She was extubated on 11/23 and has MBS on 11/25 and initiated a dysphagia 1 diet with honey thick liquids.  She continues to have bouts of lethargy and follow up CT 11/27 showed overall improvement with near resolution of SAH and bifrontal parenchymal hemorrhage as well as subtle new 3 mm midline shift. Hypoxia due to ABLA has improved with transfusion of 1 unit PRBC.   Patient with resultant balance deficits needing assist for sitting balance at EOB, with posterior bias with standing attempts, BLE instability requiring blocking, difficulty initiating movement--question apraxia as well as delayed processing. CIR recommended by MD and rehab team for follow up  therapy and patient admitted 11/30. Patient demonstrates behaviors consistent with a [place]o Level VI characterized by impaired sustained attention, functional problem solving, recall of new information, intellectual awareness and safety awareness with intermittent bouts of lethargy which impacts her ability to complete functional and familiar tasks safely. Patient also consumed  trials of Dys. 1 textures with honey-thick liquids via cup without overt s/s of aspiration but required Mod A verbal and tactile cues for swallow initiation due to oral holding and portion control. Further trials were not attempted today due to lethargy and oral holding.Patient also demonstrates a hoarse vocal quality, suspect due to prolonged intubation. Recommend patient continue current diet with full supervision. Patient would benefit from skilled SLP intervention to maximize her cognitive and swallowing function and overall functional independence prior to discharge.   Skilled Therapeutic Interventions          Administered a cognitive-linguistic evaluation and BSE. Please see above for details.   SLP Assessment  Patient will need skilled Speech Lanaguage Pathology Services during CIR admission    Recommendations  SLP Diet Recommendations: Dysphagia 1 (Puree);Honey Liquid Administration via: Cup;No straw Medication Administration: Crushed with puree Supervision: Full supervision/cueing for compensatory strategies;Staff to assist with self feeding Compensations: Small sips/bites;Effortful swallow;Multiple dry swallows after each bite/sip Postural Changes and/or Swallow Maneuvers: Seated upright 90 degrees Oral Care Recommendations: Oral care BID Recommendations for Other Services: Neuropsych consult Patient destination: Home Follow up Recommendations: Home Health SLP;Outpatient SLP;24 hour supervision/assistance Equipment Recommended: To be determined    SLP Frequency 3 to 5 out of 7 days   SLP Treatment/Interventions Cognitive remediation/compensation;Cueing hierarchy;Functional tasks;Environmental controls;Internal/external aids;Dysphagia/aspiration precaution training;Therapeutic Activities;Patient/family education   Pain Pain Assessment Pain Assessment: No/denies pain  Prior Functioning Type of Home: Other(Comment) (condo)  Lives With: Alone Available Help at Discharge:  Family;Available PRN/intermittently;Other (Comment) (son reports that if she went home she would have 24/7 paid caregivers) Vocation: Retired  Function:  Eating Eating   Modified Consistency Diet: Yes Eating Assist Level: Supervision or verbal cues;Set up assist for   Eating Set Up Assist For: Opening containers       Cognition Comprehension Comprehension assist level: Understands basic 50 - 74% of the time/ requires cueing 25 - 49% of the time  Expression   Expression assist level: Expresses basic 50 - 74% of the time/requires cueing 25 - 49% of the time. Needs to repeat parts of sentences.  Social Interaction Social Interaction assist level: Interacts appropriately 50 - 74% of the time - May be physically or verbally inappropriate.  Problem Solving Problem solving assist level: Solves basic 25 - 49% of the time - needs direction more than half the time to initiate, plan or complete simple activities  Memory Memory assist level: Recognizes or recalls 25 - 49% of the time/requires cueing 50 - 75% of the time   Short Term Goals: Week 1: SLP Short Term Goal 1 (Week 1): Patient will consume current diet with minimal overt s/s of aspiration with Min A verbal cues for use of swallow strategies.  SLP Short Term Goal 2 (Week 1): Patient will consume trials of nectar-thick liqiuds with minimal overt s/s of aspiration with Min A verbal cues over 3 consecutive sessions to assess readiness for repeat MBS SLP Short Term Goal 3 (Week 1): Patient will sustain attention to a functional task for 5 minutes with Mod A verbal cues for redirection.  SLP Short Term Goal 4 (Week 1): Patient will identify 2 physical and 2 cognitive deficits with Mod A  question and semantic cues.  SLP Short Term Goal 5 (Week 1): Patient will initiate functional tasks with no more than 1 visual and 1 verbal cue in 75% of opportunities.  SLP Short Term Goal 6 (Week 1): Patient will demonstrate functional problem solving for basic  and familiar tasks with Mod A verbal cues.   Refer to Care Plan for Long Term Goals  Recommendations for other services: Neuropsych  Discharge Criteria: Patient will be discharged from SLP if patient refuses treatment 3 consecutive times without medical reason, if treatment goals not met, if there is a change in medical status, if patient makes no progress towards goals or if patient is discharged from hospital.  The above assessment, treatment plan, treatment alternatives and goals were discussed and mutually agreed upon: by patient and by family  Petrice Beedy 08/23/2015, 5:03 PM

## 2015-08-23 NOTE — Progress Notes (Signed)
Patient information reviewed and entered into eRehab system by Makell Cyr, RN, CRRN, PPS Coordinator.  Information including medical coding and functional independence measure will be reviewed and updated through discharge.     Per nursing patient was given "Data Collection Information Summary for Patients in Inpatient Rehabilitation Facilities with attached "Privacy Act Statement-Health Care Records" upon admission.  

## 2015-08-23 NOTE — Progress Notes (Addendum)
[facility] PHYSICAL MEDICINE & REHABILITATION     PROGRESS NOTE    Subjective/Complaints: Had a fair night. Low back is quite sore. Denies headaches. Fatigued easily with PT this morning.   ROS limited due to cognitive/communication deficits  Objective: Vital Signs: Blood pressure 150/62, pulse 85, temperature 97.8 F (36.6 C), temperature source Oral, resp. rate 18, height  (1.6 m), weight 66.7 kg (147 lb 0.8 oz), SpO2 96 %. Dg Chest Port 1 View  08/21/2015  CLINICAL DATA:  Recent congestive heart failure. Diminished breath sounds EXAM: PORTABLE CHEST 1 VIEW COMPARISON:  August 19, 2015 FINDINGS: There has been interval clearing of opacity from the left base. Currently there is mild left base atelectasis. There is no frank edema or consolidation. Heart remains slightly enlarged with pulmonary vascularity currently within normal limits. No adenopathy. Central catheter tip is at the cavoatrial junction. No pneumothorax. IMPRESSION: Interval clearing left base. There is now mild atelectasis in this region. Currently no edema or consolidation. Stable cardiac prominence. No pneumothorax. Electronically Signed   By: Bretta Bang III M.D.   On: 08/21/2015 12:53    Recent Labs  08/21/15 0424 08/23/15 0450  WBC 10.2 7.6  HGB 10.0* 8.9*  HCT 30.3* 28.1*  PLT 311 289    Recent Labs  08/21/15 0424 08/23/15 0450  NA 138 138  K 3.8 3.7  CL 105 106  GLUCOSE 95 104*  BUN 32* 17  CREATININE 1.41* 1.08*  CALCIUM 8.4* 8.6*   CBG (last 3)   Recent Labs  08/22/15 1652 08/22/15 2156 08/23/15 0638  GLUCAP 115* 127* 95    Wt Readings from Last 3 Encounters:  08/23/15 66.7 kg (147 lb 0.8 oz)  08/22/15 67 kg (147 lb 11.3 oz)  08/03/15 65.772 kg (145 lb)    Physical Exam:  Constitutional: She is oriented to person, place, and time. She appears well-developed and well-nourished.     HENT:  Head: Normocephalic and atraumatic.  Right Ear: No decreased hearing is  noted.  Left Ear: No decreased hearing is noted.  Mouth/Throat: Mucous membranes still dry. Debris on teeth as well..  Ecchymosis left forehead   Eyes: Conjunctivae are normal. Pupils are equal, round, and reactive to light.   Neck: Normal range of motion. Neck supple.  Cardiovascular: Normal rate and regular rhythm.  Respiratory: Effort normal and breath sounds normal. No respiratory distress. She has no wheezes.  GI: Soft. Bowel sounds are normal. She exhibits no distension. There is no tenderness. There is no rebound.  Musculoskeletal: low back tender with truncal movement and palpation  Neurological: She is alert and oriented to person, place, and time. Left facial weakness.   Hoarse/ dysphonic speech with dry irritating cough. Able to state day (off by one), month, city, state.  Was unable to state year . Able to follow one and two step motor commands. Skin: Skin is warm and dry.  Multiple scabbed areas bilateral feet. Gluteal cleft with dry flaky skin.  Psychiatric: She has a normal mood and affect. Her speech is not tangential and not slurred. She is slowed. Cognition and memory are impaired.  Motor strength is 4/5 bilateral deltoids, biceps, triceps, grip, hip flexor, knee extensors, ankle dorsiflexor and plantar flexor Sensation intact to light touch in bilateral upper and lower limbs Skin: indurated area right forearm with dry dressing  Assessment/Plan: 1. Weakness, balance and cognitive deficits secondary to traumatic brain injury which require 3  hours per day of interdisciplinary therapy in a comprehensive  inpatient rehab setting. (reduced to allow for fatigue,poor activity tolerance thus far) Physiatrist is providing close team supervision and 24 hour management of active medical problems listed below. Physiatrist and rehab team continue to assess barriers to discharge/monitor patient progress toward functional and medical goals.  Function:  Bathing Bathing position       Bathing parts      Bathing assist        Upper Body Dressing/Undressing Upper body dressing                    Upper body assist        Lower Body Dressing/Undressing Lower body dressing                                  Lower body assist        Toileting Toileting          Toileting assist     Transfers Chair/bed transfer             Locomotion Ambulation           Wheelchair          Cognition Comprehension Comprehension assist level: Understands basic 50 - 74% of the time/ requires cueing 25 - 49% of the time  Expression Expression assist level: Expresses basic 50 - 74% of the time/requires cueing 25 - 49% of the time. Needs to repeat parts of sentences.  Social Interaction Social Interaction assist level: Interacts appropriately 25 - 49% of time - Needs frequent redirection.  Problem Solving Problem solving assist level: Solves basic 50 - 74% of the time/requires cueing 25 - 49% of the time  Memory Memory assist level: Recognizes or recalls 25 - 49% of the time/requires cueing 50 - 75% of the time   Medical Problem List and Plan: 1. Cognitive deficits, gait disorder secondary to Traumatic frontal intracranial hemorrhage, subarachnoid hemorrhage and subdural hemorrhage 2. DVT Prophylaxis/Anticoagulation: Mechanical: Sequential compression devices, below knee Bilateral lower extremities 3. Chronic back pain/Pain Management: Used hydrocodone tid due to back and LE pain. [facility]   -scheduled hydrocodone to help with activity tolerance 4. Mood: LCSW to follow for evaluation and support.  5. Neuropsych: This patient is not fully capable of making decisions on her own behalf. 6. Skin/Wound Care: Routine pressure relief measures. Maintain adequate nutrition and hyrdration status.  7. Fluids/Electrolytes/Nutrition: Continue Dysphagia 1, honey liquids as swallow function shows minimal improvement. IVF at night and push fluids  during the day. all labs personally reviewed today 8. Acute on chronic renal failure: Cr now down to 1.08--continue to encourage fluids, hs ivf  9. HTN: Monitor tid for now.  10. Acute on chronic Iron deficiency anemia: Added iron supplement. hgb 8.9 today,  i would assume some of this is dilutional in addtion to #13 11. Aspiration PNA/H flu PNA: Has completed antibiotic course.  12. 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 Continue amiodarone daily and lopressor bid. To follow up with [facility] after discharge.  13. Rectal bleeding: Has resolved--monitor for recurrence. Will need follow up evaluation after discharge.  14. Depression: Used to take Celexa and Abilify PTA with good results per family.  15. Urinary retention?: Foley replaced due to poor UOP. 16. Cellulitis right forearm: doxycycline and dry dressing   LOS (Days) 1 A FACE TO FACE EVALUATION WAS PERFORMED  Jacody Beneke T 08/23/2015 9:44 AM

## 2015-08-23 NOTE — Progress Notes (Signed)
Initial Nutrition Assessment  DOCUMENTATION CODES:   Not applicable  INTERVENTION:  Provide Magic cup TID between meals, each supplement provides 290 kcal and 9 grams of protein.  Provide nourishment snacks (pudding, applesauce).   Encourage adequate PO intake.   NUTRITION DIAGNOSIS:   Inadequate oral intake related to poor appetite as evidenced by meal completion < 25%.  GOAL:   Patient will meet greater than or equal to 90% of their needs  MONITOR:   PO intake, Supplement acceptance, Diet advancement, Weight trends, I & O's, Labs, Skin  REASON FOR ASSESSMENT:   Malnutrition Screening Tool    ASSESSMENT:   81 y.o. Left handed female with history of HTN, depression, spinal stenosis with BLE weakness and post laminectomy syndrome with chronic pain who was admitted on 08/03/15 after a fall in parking lot and subsequent acute traumatic SAH along frontal convexities and acute SDH with confusion and waxing and waining of mental status.  She continues to have bouts of lethargy and follow up CT 11/27 showed overall improvement with near resolution of SAH and bifrontal parenchymal hemorrhage as well as subtle new 3 mm midline shift.  Pt reports having a decreased appetite. Meal completion has been 5-25%. Pt reports eating fairly well at home PTA with 3 meals a day, however she does report portions have been small. Son at bedside reports small portions at meals have been normal. Pt with no significant weight loss per weight records. Son reports pt does consume Boost at home on occasions. Boost was offered, however pt reports she would rather continue with Magic cup. Additionally discussed food preferences with pt/son. RD to put in a note in health touch regarding food preferences/dislikes. Pt was encouraged to eat her food at meals.   Pt with no significant fat or muscle mass loss.   Labs and medications reviewed.   Diet Order:  DIET - DYS 1 Room service appropriate?: Yes; Fluid  consistency:: Honey Thick  Skin:  Wound (see comment) (Stage II pressure ulcer on coccyx)  Last BM:  11/30  Height:   Ht Readings from Last 1 Encounters:  08/22/15 5\' 3"  (1.6 m)    Weight:   Wt Readings from Last 1 Encounters:  08/23/15 147 lb 0.8 oz (66.7 kg)    Ideal Body Weight:  52.2 kg  BMI:  Body mass index is 26.05 kg/(m^2).  Estimated Nutritional Needs:   Kcal:  1600-1800  Protein:  80-90 grams  Fluid:  1.6 -1.8 L/day  EDUCATION NEEDS:   No education needs identified at this time  Roslyn SmilingStephanie Shanika Levings, MS, RD, LDN Pager # [phone] After hours/ weekend pager # [phone]

## 2015-08-23 NOTE — Evaluation (Signed)
Physical Therapy Assessment and Plan  Patient Details  Name: Lisa [name] MRN: [id number] Date of Birth: 23-Jul-1927  PT Diagnosis: Abnormal posture, Abnormality of gait, Cognitive deficits, Difficulty walking, Low back pain, Muscle weakness and Pain in low back Rehab Potential: Good ELOS: 21-24 days   Today's Date: 08/23/2015 PT Individual Time: 800-900 and 1335-1355  60 min and 20 min    Problem List:  Patient Active Problem List   Diagnosis Date Noted  . TBI (traumatic brain injury) ([place]) 08/22/2015  . ICH (intracerebral hemorrhage) ([place])   . Abscess   . Hypomagnesemia   . SDH (subdural hematoma) ([place])   . Boil of upper extremity   . Pneumonia due to Haemophilus influenzae ([place])   . Chronic systolic CHF (congestive heart failure) ([place])   . Acute on chronic renal failure ([place])   . Hypokalemia   . Bright red blood per rectum   . Dysphagia   . Pressure ulcer 08/16/2015  . Acute respiratory failure with hypoxemia ([place])   . Hypernatremia   . Subdural hematoma ([place]) 08/03/2015  . Lumbar radiculopathy 06/06/2015  . Spinal stenosis, lumbar region, with neurogenic claudication 04/02/2015  . Degenerative lumbar disc 02/15/2015  . Lumbar post-laminectomy syndrome 02/15/2015  . Facet syndrome, lumbar 02/15/2015  . Sacroiliac joint dysfunction 02/15/2015    Past Medical History:  Past Medical History  Diagnosis Date  . Allergy   . Pelvis fracture ([place])   . Wrist fracture, right   . Depression   . Spinal stenosis   . Osteoarthritis   . Hypertension   . Foot fracture     bilateral   Past Surgical History:  Past Surgical History  Procedure Laterality Date  . Back surgery N/A   . Breast lumpectomy N/A     Assessment & Plan Clinical Impression: Lisa [name] is a 81 y.o. Left handed female with history of HTN, depression, spinal stenosis with BLE weakness and post laminectomy syndrome with chronic pain who was admitted on 08/03/15 after a fall in parking lot and subsequent  acute traumatic SAH along frontal convexities and acute SDH with confusion and waxing and waining of mental status. She was transferred to [facility] form [facility] and was evaluated by Dr. Arneta Cliche who recommended conservative management with serial CCT. Follow up CCT showed delayed development of bilateral frontal ICH hematomas with edema and continued right SDH. Hospital course complicated by wide complex tachycardia with cardio-pulmonary arrest. She was intubated on 11/16 and started on IV antibiotics due to concern of aspiration PNA and large mucous plug suctioned past intubation. Sputum culture was positive for H flu PNA.   Cardiology consulted for input on NSVT and elevated trops. She was started on IV amiodarone and lopressor as not candidate for invasive workup. 2 D echo done revealing EF 15-20% with akinesis of entire anteroseptal myocardium likely due to stress induced cardiomyopathy. Follow up echo on 11/23 showed improvement in EF to 25%. She was extubated on 11/23 and started on dysphagia 1, honey thick as alertness improved. She continues to have bouts of lethargy and follow up CT 11/27 showed overall improvement with near resolution of SAH and bifrontal parenchymal hemorrhage as well as subtle new 3 mm midline shift. Hypoxia due to ABLA has improved with transfusion of 1 unit PRBC. Patient with resultant balance deficits needing assist for sitting balance at EOB, with posterior bias with standing attempts, BLE instability requiring blocking, difficulty initiating movement--question apraxia as well as delayed processing.  Patient transferred to [facility] on  08/22/2015.   Patient currently requires max with mobility secondary to muscle weakness and muscle joint tightness, decreased cardiorespiratory endurance, decreased initiation, decreased attention, decreased awareness, decreased problem solving, decreased safety awareness, decreased memory and delayed processing and decreased sitting balance, decreased  standing balance, decreased postural control and decreased balance strategies.  Prior to hospitalization, patient was independent  with mobility and lived with Alone in a Other(Comment) (condo) home.  Home access is 3Stairs to enter.  Patient will benefit from skilled PT intervention to maximize safe functional mobility, minimize fall risk and decrease caregiver burden for planned discharge home with 24 hour supervision/assist.  Anticipate patient will benefit from follow up [facility] at discharge.  PT - End of Session Activity Tolerance: Tolerates < 10 min activity with changes in vital signs;Decreased this session Endurance Deficit: Yes Endurance Deficit Description: Patient fatigues quickly with all mobility/upright activity with increase in RR and HR with one squat pivot transfer to wheelchair PT Assessment Rehab Potential (ACUTE/IP ONLY): Good Barriers to Discharge: Inaccessible home environment;Decreased caregiver support (son plans to hire 24/7 caregiver assist if patient able to DC home) PT Patient demonstrates impairments in the following area(s): Balance;Endurance;Motor;Nutrition;Pain;Safety;Skin Integrity PT Transfers Functional Problem(s): Bed Mobility;Bed to Chair;Car;Furniture PT Locomotion Functional Problem(s): Ambulation;Wheelchair Mobility;Stairs PT Plan PT Intensity: Minimum of 1-2 x/day ,45 to 90 minutes PT Frequency: 5 out of 7 days PT Duration Estimated Length of Stay: 21-24 days PT Treatment/Interventions: Ambulation/gait training;Balance/vestibular training;Cognitive remediation/compensation;Community reintegration;Discharge planning;Disease management/prevention;DME/adaptive equipment instruction;Functional mobility training;Neuromuscular re-education;Pain management;Patient/family education;Psychosocial support;Stair training;Therapeutic Activities;Therapeutic Exercise;UE/LE Strength taining/ROM;UE/LE Coordination activities;Wheelchair propulsion/positioning PT Transfers  Anticipated Outcome(s): supervision PT Locomotion Anticipated Outcome(s): supervision household ambulator PT Recommendation Follow Up Recommendations: Home health PT;24 hour supervision/assistance Patient destination: Home Equipment Recommended: To be determined;Wheelchair (measurements);Wheelchair cushion (measurements) Equipment Details: per son, patient has RW and [facility]  Skilled Therapeutic Intervention Treatment 1: Skilled therapeutic intervention initiated after completion of evaluation. Discussed with patient and son falls risk, safety within room, and focus of therapy during stay. Discussed possible length of stay, goals, and follow-up therapy. Patient in bed eating breakfast with nurse tech. Patient required max encouragement and cues to adhere to swallowing strategies to a couple bites and a few sips of food and drink. Patient with lateral lean to L in sitting that she reports improves chronic back pain from "3 bulging discs and 2 pinched nerves." Patient's son assisted with history due to patient fatigue and reports that patient was falling more frequently PTA than he was aware of. Patient eventually agreeable to transfer to edge of bed with max A and cues for sequencing and performed squat pivot transfer to wheelchair with max A and cues for sequencing. Patient propelled wheelchair using BUE x 75 ft with supervision and cues for technique in straight path in controlled environment. Patient required multiple prolonged rest breaks in session due to fatigue and decreased activity tolerance as noted by increased RR and HR with minimal activity. Patient left sitting in wheelchair for OT evaluation with son present and needs within reach. Discussed with team and patient switched from 4.5 hours of therapy/day to 3 hours/day due to decreased tolerance at this time to improve patient participation.  Treatment 2: Patient semi reclined in bed with son present for session, patient reporting increased anxiety  but unable to further explain. Son reported that this was not unusual for patient. Patient eventually agreeable to sitting on edge of bed and was able to bring BLE off edge of bed with mod-max A to elevate trunk with HOB  elevated and use of bed rail. Patient performed reciprocal scooting with min A to allow feet to touch floor. Patient sat edge of bed with supervision. Performed sit <> stand from edge of bed with L HHA and max A, patient also reaching out for son's hand and maintained static standing with BUE HHA x 30 seconds before requesting to sit down. Patient c/o dizziness and requested to lie down, transferred sit > supine with mod A and attempted to assist with repositioning with BLE in hooklying but only able to bridge, unable to scoot up in bed and required +2A to reposition higher in bed. Sitting up in bed, engaged patient in therapeutic conversation with focus on biography and family, patient required increased time and min multimodal cues. Patient reporting headache pain and left semi reclined in bed with son present.   PT Evaluation Precautions/Restrictions Precautions Precautions: Fall Restrictions Weight Bearing Restrictions: No General Chart Reviewed: Yes Family/Caregiver Present: Yes (son)  Vital Signs Seated BP 139/53, HR 100, Sp02 97% on ra Pain Pain Assessment Pain Assessment: 0-10 Pain Score: 4  Pain Type: Chronic pain Pain Location: Back Pain Orientation: Lower Pain Descriptors / Indicators: Aching Pain Onset: Gradual Pain Intervention(s): Repositioned;Heat applied Multiple Pain Sites: No Home Living/Prior Functioning Home Living Available Help at Discharge: Family;Available PRN/intermittently;Other (Comment) (son reports that if she went home she would have 24/7 paid caregivers) Type of Home: Other(Comment) (condo) Home Access: Stairs to enter [employer] of Steps: 3 Entrance Stairs-Rails: Right;Left Home Layout: Two level;Able to live on main level  with bedroom/bathroom Alternate Level Stairs-Number of Steps: flight of stairs to second level Bathroom Shower/Tub: Multimedia programmer: Handicapped height Bathroom Accessibility: Yes Additional Comments: has built in shower chair  Lives With: Alone Prior Function Level of Independence: Independent with basic ADLs;Independent with homemaking with ambulation;Independent with gait;Independent with transfers  Able to Take Stairs?: Yes Driving: Yes Vocation: Retired Comments: Active in church and [employer] Vision/Perception  Vision - Assessment Additional Comments: difficult to assess secondary to pt being lethargic during evaluation  Cognition Overall Cognitive Status: Impaired/Different from baseline Arousal/Alertness: Lethargic Orientation Level: Oriented X4 Attention: Focused;Sustained Focused Attention: Appears intact Sustained Attention: Impaired Sustained Attention Impairment: Verbal basic;Functional basic Memory: Impaired Memory Impairment: Decreased recall of new information;Decreased short term memory Decreased Short Term Memory: Verbal basic;Functional basic Awareness: Impaired Awareness Impairment: Intellectual impairment Problem Solving: Impaired Problem Solving Impairment: Verbal basic;Functional basic Executive Function:  (all impaired due to lower level deficits ) Safety/Judgment: Impaired [employer] Scales of Cognitive Functioning: Confused/appropriate Sensation Sensation Light Touch: Appears Intact Stereognosis: Not tested Hot/Cold: Appears Intact Proprioception: Appears Intact Coordination Gross Motor Movements are Fluid and Coordinated: No Fine Motor Movements are Fluid and Coordinated: Yes Motor  Motor Motor: Abnormal postural alignment and control Motor - Skilled Clinical Observations: Generalized weakness, lateral lean to L in sitting  Mobility Bed Mobility Bed Mobility: Supine to Sit Supine to Sit: 2: Max assist;With rails;HOB  elevated Supine to Sit Details: Tactile cues for initiation;Verbal cues for technique;Verbal cues for precautions/safety;Verbal cues for sequencing;Manual facilitation for weight shifting Transfers Transfers: Yes Squat Pivot Transfers: 2: Max assist;With upper extremity assistance;With armrests Squat Pivot Transfer Details: Tactile cues for initiation;Verbal cues for sequencing;Verbal cues for precautions/safety;Manual facilitation for weight shifting Locomotion  Ambulation Ambulation: No Gait Gait: No Stairs / Additional Locomotion Stairs: No Ramp: Not tested (comment) Curb: Not tested (comment) Product manager Mobility: Yes Wheelchair Assistance: 5: Supervision Wheelchair Assistance Details: Verbal cues for technique;Verbal cues for Astronomer:  Both upper extremities Wheelchair Parts Management: Needs assistance Distance: 75 ft  Trunk/Postural Assessment  Cervical Assessment Cervical Assessment: Within Functional Limits Thoracic Assessment Thoracic Assessment: Exceptions to [facility] (kyphotic) Lumbar Assessment Lumbar Assessment: Exceptions to [facility] (lateral lean to L due to chronic back pain) Postural Control Postural Control: Deficits on evaluation Righting Reactions: delayed  Balance Balance Balance Assessed: Yes Static Sitting Balance Static Sitting - Balance Support: Feet supported Static Sitting - Level of Assistance: 5: Stand by assistance Static Sitting - Comment/# of Minutes: lateral lean to left, with questioning patient reports this relieves back pain Extremity Assessment  RUE Assessment RUE Assessment: Exceptions to [facility] (3+/5 throughout) LUE Assessment LUE Assessment: Exceptions to [facility] (3+/5 throughout) RLE Assessment RLE Assessment: Exceptions to [facility] RLE Strength RLE Overall Strength: Deficits RLE Overall Strength Comments: Unable to achieve full AROM but able to withstand min-mod resistance for hip flexion, knee  flexion/extension, and ankle DF/PF LLE Assessment LLE Assessment: Exceptions to [facility] LLE Strength LLE Overall Strength: Deficits LLE Overall Strength Comments: Unable to achieve full AROM but able to withstand min-mod resistance for hip flexion, knee flexion/extension, and ankle DF/PF   See Function Navigator for Current Functional Status.   Refer to Care Plan for Long Term Goals  Recommendations for other services: None  Discharge Criteria: Patient will be discharged from PT if patient refuses treatment 3 consecutive times without medical reason, if treatment goals not met, if there is a change in medical status, if patient makes no progress towards goals or if patient is discharged from hospital.  The above assessment, treatment plan, treatment alternatives and goals were discussed and mutually agreed upon: by patient and by family  Laretta Alstrom 08/23/2015, 1:22 PM

## 2015-08-23 NOTE — Plan of Care (Signed)
Physical Therapy Note  Patient Details  Name: Marita SnellenFaye K Beamer MRN: [id number] Date of Birth: 12/06/1926 Today's Date: 08/23/2015  Discussed with treatment team and patient's schedule modified to 3 hours/day due to significantly decreased activity tolerance and fatigue.    Bayard HuggerVarner, Bettie Swavely A 08/23/2015, 9:10 AM

## 2015-08-23 NOTE — Evaluation (Signed)
Occupational Therapy Assessment and Plan  Patient Details  Name: Lisa [name] MRN: [id number] Date of Birth: 1927/02/21  OT Diagnosis: abnormal posture, acute pain, cognitive deficits and muscle weakness (generalized) Rehab Potential: Rehab Potential (ACUTE ONLY): Fair ELOS: 21-24 days   Today's Date: 08/23/2015 OT Individual Time: 0900-1000 OT Individual Time Calculation (min): 60 min     Problem List:  Patient Active Problem List   Diagnosis Date Noted  . TBI (traumatic brain injury) ([place]) 08/22/2015  . ICH (intracerebral hemorrhage) ([place])   . Abscess   . Hypomagnesemia   . SDH (subdural hematoma) ([place])   . Boil of upper extremity   . Pneumonia due to Haemophilus influenzae ([place])   . Chronic systolic CHF (congestive heart failure) ([place])   . Acute on chronic renal failure ([place])   . Hypokalemia   . Bright red blood per rectum   . Dysphagia   . Pressure ulcer 08/16/2015  . Acute respiratory failure with hypoxemia ([place])   . Hypernatremia   . Subdural hematoma ([place]) 08/03/2015  . Lumbar radiculopathy 06/06/2015  . Spinal stenosis, lumbar region, with neurogenic claudication 04/02/2015  . Degenerative lumbar disc 02/15/2015  . Lumbar post-laminectomy syndrome 02/15/2015  . Facet syndrome, lumbar 02/15/2015  . Sacroiliac joint dysfunction 02/15/2015    Past Medical History:  Past Medical History  Diagnosis Date  . Allergy   . Pelvis fracture ([place])   . Wrist fracture, right   . Depression   . Spinal stenosis   . Osteoarthritis   . Hypertension   . Foot fracture     bilateral   Past Surgical History:  Past Surgical History  Procedure Laterality Date  . Back surgery N/A   . Breast lumpectomy N/A     Assessment & Plan Clinical Impression: Patient is a 81 y.o. Left handed female with history of HTN, depression, spinal stenosis with BLE weakness and post laminectomy syndrome with chronic pain who was admitted on 08/03/15 after a fall in parking lot and subsequent acute  traumatic SAH along frontal convexities and acute SDH with confusion and waxing and waining of mental status. She was transferred to [facility] form [facility] and was evaluated by Dr. Arneta Cliche who recommended conservative management with serial CCT. Follow up CCT showed delayed development of bilateral frontal ICH hematomas with edema and continued right SDH. Hospital course complicated by wide complex tachycardia with cardio-pulmonary arrest. She was intubated on 11/16 and started on IV antibiotics due to concern of aspiration PNA and large mucous plug suctioned past intubation. Sputum culture was positive for H flu PNA.   Cardiology consulted for input on NSVT and elevated trops. She was started on IV amiodarone and lopressor as not candidate for invasive workup. 2 D echo done revealing EF 15-20% with akinesis of entire anteroseptal myocardium likely due to stress induced cardiomyopathy. Follow up echo on 11/23 showed improvement in EF to 25%. She was extubated on 11/23 and started on dysphagia 1, honey thick as alertness improved. She continues to have bouts of lethargy and follow up CT 11/27 showed overall improvement with near resolution of SAH and bifrontal parenchymal hemorrhage as well as subtle new 3 mm midline shift. Hypoxia due to ABLA has improved with transfusion of 1 unit PRBC. Patient with resultant balance deficits needing assist for sitting balance at EOB, with posterior bias with standing attempts, BLE instability requiring blocking, difficulty initiating movement--question apraxia as well as delayed processing. CIR recommended by MD and rehab team for follow up therapy. Patient transferred  to CIR on 08/22/2015 .    Patient currently requires mod - total A with basic self-care skills secondary to muscle weakness, decreased cardiorespiratoy endurance, decreased initiation, decreased attention, decreased problem solving and decreased safety awareness and decreased sitting balance, decreased standing  balance, decreased postural control and decreased balance strategies.  Prior to hospitalization, patient could complete ADL and IADLs with modified independent .  Patient will benefit from skilled intervention to decrease level of assist with basic self-care skills prior to discharge home with care partner.  Anticipate patient will require 24 hour supervision and minimal physical assistance and follow up home health.  OT - End of Session Activity Tolerance: Decreased this session Endurance Deficit: Yes Endurance Deficit Description: Patient very fatigued from previous session and transferred into bed for self care task. Pt unable to keep eyes away and requriing increased verbal cues to attend to tasks. OT Assessment Rehab Potential (ACUTE ONLY): Fair Barriers to Discharge: Other (comment) Barriers to Discharge Comments: none known at this time OT Patient demonstrates impairments in the following area(s): Balance;Endurance;Motor;Safety;Pain;Cognition OT Basic ADL's Functional Problem(s): Grooming;Bathing;Dressing;Toileting OT Transfers Functional Problem(s): Toilet;Tub/Shower OT Additional Impairment(s): None OT Plan OT Intensity: Minimum of 1-2 x/day, 45 to 90 minutes OT Frequency: 5 out of 7 days OT Duration/Estimated Length of Stay: 21-24 days OT Treatment/Interventions: Balance/vestibular training;DME/adaptive equipment instruction;Patient/family education;Therapeutic Activities;Therapeutic Exercise;Wheelchair propulsion/positioning;Cognitive remediation/compensation;Community reintegration;Functional mobility training;Self Care/advanced ADL retraining;UE/LE Strength taining/ROM;Discharge planning;Pain management;UE/LE Coordination activities OT Self Feeding Anticipated Outcome(s): n/a OT Basic Self-Care Anticipated Outcome(s): supervision - min A OT Toileting Anticipated Outcome(s): min A OT Bathroom Transfers Anticipated Outcome(s): min A OT Recommendation Recommendations for Other  Services:  (none at this time) Patient destination: Home Follow Up Recommendations: Home health OT;24 hour supervision/assistance Equipment Recommended: 3 in 1 bedside comode;Tub/shower seat   Skilled Therapeutic Intervention Upon entering the room, pt seated in wheelchair awaiting therapist with son present. Pt extremely fatigued from previous therapy session and son reports, "Since being sick she hasn't really been up at all." OT educated pt and son on OT purpose, POC, and goals with them both verbalizing understanding. Pt requesting to return to bed during evaluation for self care tasks. Pt declined toileting this session. Pt transferred wheelchair >bed with max A for stand pivot transfer. Pt required assistance for B LEs into bed. Head elevated and pt engaged in washing self with max verbal cues for initiation as pt falling asleep secondary to fatigue. Pt able to roll to L <>R with mod A and min verbal cues for proper technique and hand placement. Pt able to participate in 40 minutes of active participation before falling asleep. At this point, her son had many questions regarding rehab expectations and discharge recommendations. OT provided education until all questions answered. Bed alarm activated, call bell within reach, and son remaining present in room upon exiting.  OT Evaluation Precautions/Restrictions  Precautions Precautions: Fall Restrictions Weight Bearing Restrictions: No General   Vital Signs Therapy Vitals Temp: 98.1 F (36.7 C) Temp Source: Oral Pulse Rate: 85 Resp: 18 BP: (!) 139/54 mmHg Patient Position (if appropriate): Lying Oxygen Therapy SpO2: 97 % O2 Device: Not Delivered Pain Pain Assessment Pain Assessment: 0-10 Pain Score: 4  Pain Type: Chronic pain Pain Location: Back Pain Orientation: Lower Pain Descriptors / Indicators: Aching Pain Onset: Gradual Pain Intervention(s): Repositioned;Heat applied Multiple Pain Sites: No Home Living/Prior  Functioning Home Living Family/patient expects to be discharged to:: Private residence Living Arrangements: Alone Available Help at Discharge: Family, Available PRN/intermittently, Other (Comment) (son reports  that if she went home she would have 24/7 paid caregivers) Type of Home: Other(Comment) (condo) Home Access: Stairs to enter Entrance Stairs-Number of Steps: 2 STE  Home Layout: Two level, Able to live on main level with bedroom/bathroom Alternate Level Stairs-Number of Steps: flight of stairs to second level Bathroom Shower/Tub: Multimedia programmer: Handicapped height Bathroom Accessibility: Yes Additional Comments: has built in shower chair  Lives With: Alone Prior Function Level of Independence: Independent with basic ADLs, Independent with homemaking with ambulation, Independent with gait, Independent with transfers  Able to Take Stairs?: Yes Driving: Yes ADL   Vision/Perception  Vision- History Baseline Vision/History: Wears glasses Wears Glasses: Reading only Patient Visual Report: No change from baseline Vision- Assessment Additional Comments: difficult to assess secondary to pt being lethargic during evaluation  Cognition Overall Cognitive Status: Difficult to assess (as pt was lethargic and drifting in and out of sleep) Arousal/Alertness: Lethargic Orientation Level: Person;Place;Situation Person: Oriented Place: Oriented Situation: Oriented Year: 2016 Month: November Day of Week: Other (Comment) (states "friday" (off by one day)) Memory: Appears intact Immediate Memory Recall: Sock;Blue;Bed Memory Recall: Sock;Blue;Bed Memory Recall Sock: Without Cue Memory Recall Blue: With Cue Memory Recall Bed: Without Cue Sensation Sensation Light Touch: Appears Intact Stereognosis: Not tested Hot/Cold: Appears Intact Proprioception: Appears Intact Coordination Gross Motor Movements are Fluid and Coordinated: No Fine Motor Movements are Fluid and  Coordinated: Yes Motor  Motor Motor: Abnormal postural alignment and control Motor - Skilled Clinical Observations: Generalized weakness, lateral lean to L in sitting Mobility  Bed Mobility Bed Mobility: Supine to Sit Supine to Sit: 2: Max assist;With rails;HOB elevated Supine to Sit Details: Tactile cues for initiation;Verbal cues for technique;Verbal cues for precautions/safety;Verbal cues for sequencing;Manual facilitation for weight shifting  Trunk/Postural Assessment  Cervical Assessment Cervical Assessment: Within Functional Limits Thoracic Assessment Thoracic Assessment: Exceptions to [facility] (kyphotic) Lumbar Assessment Lumbar Assessment: Exceptions to WFL (lateral lean) Postural Control Postural Control: Deficits on evaluation Righting Reactions: delayed  Balance Balance Balance Assessed: Yes Static Sitting Balance Static Sitting - Balance Support: Feet supported Static Sitting - Level of Assistance: 5: Stand by assistance;4: Min assist Static Sitting - Comment/# of Minutes: lateral lean to left, with questioning patient reports this relieves back pain Extremity/Trunk Assessment RUE Assessment RUE Assessment: Exceptions to [facility] (3+/5 throughout) LUE Assessment LUE Assessment: Exceptions to [facility] (3+/5 throughout)   See Function Navigator for Current Functional Status.   Refer to Care Plan for Long Term Goals  Recommendations for other services: None  Discharge Criteria: Patient will be discharged from OT if patient refuses treatment 3 consecutive times without medical reason, if treatment goals not met, if there is a change in medical status, if patient makes no progress towards goals or if patient is discharged from hospital.  The above assessment, treatment plan, treatment alternatives and goals were discussed and mutually agreed upon: by patient  Phineas Semen 08/23/2015, 4:13 PM

## 2015-08-23 NOTE — Progress Notes (Signed)
Physical Therapy Session Note  Patient Details  Name: Lisa [name] MRN: [id number] Date of Birth: 10/23/1926  Today's Date: 08/23/2015 PT Individual Time: 1530-1600 PT Individual Time Calculation (min): 30 min   Short Term Goals: Week 1:  PT Short Term Goal 1 (Week 1): Patient will perform bed mobility with min A. PT Short Term Goal 2 (Week 1): Patient will transfer wheelchair <> bed with mod A.  PT Short Term Goal 3 (Week 1): Patient will initiate ambulation.  PT Short Term Goal 4 (Week 1): Patient will maintain standing balance x 2 min with assist of one person.   Skilled Therapeutic Interventions/Progress Updates:  Pt received in bed, saying "bathroom", indicating she needed to have a BM. Nurse tech entered to assist PT. Son present at beginning and end of treatment. Therapeutic Activity - HOB already elevated and bedrail up - pt uses both to roll L with mod A, L side lie to sit req max A, squat-pivot transfer req mod-max A with armrest of w/c. PT placed transfer tub bench with toilet cut-out over toilet and instructs pt in squat-pivot transfer w/c to[facility]from [facility] over toilet req max A with +2 for brief and hygiene management. PT instructed tech to park w/c 3' from bed and PT instructs pt in sit to stand req mod-max A, then "slow dance" walk 3' with close w/c follow for safety to bed, then max A sit to supine transfer. Pt exhausted after these activities, but repeatedly thanks PT for her help. Bed alarm on and pt demonstrates ability to call nurse with call light on 2nd attempt with verbal cues. Continue per PT POC.   Therapy Documentation Precautions:  Precautions Precautions: Fall Restrictions Weight Bearing Restrictions: No General: Chart Reviewed: Yes Family/Caregiver Present: Yes (son) Pain: Pain Assessment Pain Assessment: No/denies pain Pain Score: 4  Pain Type: Chronic pain Pain Location: Back Pain Orientation: Lower Pain Descriptors / Indicators: Aching Pain Onset:  Gradual Pain Intervention(s): Repositioned;Heat applied Multiple Pain Sites: No   See Function Navigator for Current Functional Status.   Therapy/Group: Individual Therapy  Natalye Kott M 08/23/2015, 3:46 PM

## 2015-08-24 ENCOUNTER — Inpatient Hospital Stay (HOSPITAL_COMMUNITY): Payer: Medicare Other | Admitting: Occupational Therapy

## 2015-08-24 ENCOUNTER — Inpatient Hospital Stay (HOSPITAL_COMMUNITY): Payer: Medicare Other

## 2015-08-24 ENCOUNTER — Inpatient Hospital Stay (HOSPITAL_COMMUNITY): Payer: Medicare Other | Admitting: Speech Pathology

## 2015-08-24 ENCOUNTER — Inpatient Hospital Stay (HOSPITAL_COMMUNITY): Payer: Medicare Other | Admitting: Physical Therapy

## 2015-08-24 ENCOUNTER — Ambulatory Visit (HOSPITAL_COMMUNITY): Payer: Medicare Other | Admitting: Physical Therapy

## 2015-08-24 DIAGNOSIS — R52 Pain, unspecified: Secondary | ICD-10-CM

## 2015-08-24 LAB — GLUCOSE, CAPILLARY
GLUCOSE-CAPILLARY: 89 mg/dL (ref 65–99)
GLUCOSE-CAPILLARY: 88 mg/dL (ref 65–99)
Glucose-Capillary: 81 mg/dL (ref 65–99)
Glucose-Capillary: 81 mg/dL (ref 65–99)

## 2015-08-24 MED ORDER — ONDANSETRON HCL 4 MG PO TABS
4.0000 mg | ORAL_TABLET | Freq: Four times a day (QID) | ORAL | Status: DC | PRN
Start: 2015-08-24 — End: 2015-09-12
  Administered 2015-08-28 – 2015-09-04 (×5): 4 mg via ORAL
  Filled 2015-08-24 (×6): qty 1

## 2015-08-24 MED ORDER — ONDANSETRON HCL 4 MG PO TABS: 4.0000 mg | ORAL_TABLET | Freq: Three times a day (TID) | ORAL | Status: DC | PRN

## 2015-08-24 MED ORDER — ONDANSETRON HCL 4 MG/2ML IJ SOLN
4.0000 mg | Freq: Once | INTRAMUSCULAR | Status: AC
  Administered 2015-08-24: 4 mg via INTRAMUSCULAR
  Filled 2015-08-24: qty 2

## 2015-08-24 MED ORDER — APIXABAN 5 MG PO TABS
5.0000 mg | ORAL_TABLET | Freq: Two times a day (BID) | ORAL | Status: DC
  Administered 2015-08-24 – 2015-09-12 (×39): 5 mg via ORAL
  Filled 2015-08-24 (×39): qty 1

## 2015-08-24 MED ORDER — ONDANSETRON HCL 4 MG/2ML IJ SOLN: 4.0000 mg | Freq: Four times a day (QID) | INTRAMUSCULAR | Status: DC | PRN

## 2015-08-24 NOTE — Progress Notes (Signed)
Physical Therapy Session Note  Patient Details  Name: Lisa SnellenFaye K Cornick MRN: [id number] Date of Birth: 08/29/1927  Today's Date: 08/24/2015 PT Individual Time: 0900-0947 PT Individual Time Calculation (min): 47 min   Short Term Goals: Week 1:  PT Short Term Goal 1 (Week 1): Patient will perform bed mobility with min A. PT Short Term Goal 2 (Week 1): Patient will transfer wheelchair <> bed with mod A.  PT Short Term Goal 3 (Week 1): Patient will initiate ambulation.  PT Short Term Goal 4 (Week 1): Patient will maintain standing balance x 2 min with assist of one person.   Skilled Therapeutic Interventions/Progress Updates:   Patient sitting in wheelchair with basin in lap after episode of emesis and reporting weakness following OT session and back pain, son present in room. Patient propelled wheelchair x 100 ft using BUE with supervision and cues for steering and obstacle negotiation due to tendency to veer to L. Patient performed sit <> stand from wheelchair with max A x 3 but unable to maintain standing > 10 seconds before sitting back down due to fatigue. Seated BLE therex, 2 sets of 10 each LE: LAQ, hip flexion. Patient unlocked brakes and began to propel herself back to room without instruction due to fatigue, x 120 ft with supervision and increased time. Performed stand pivot transfer to bed with max A and verbal cues for technique and transferred sit > supine with min A. Patient assisted with repositioning higher in bed with BLE in hooklying position and pulling with BUE on bed rails. Patient left semi reclined in bed with all needs within reach and son in room.   Therapy Documentation Precautions:  Precautions Precautions: Fall Restrictions Weight Bearing Restrictions: No General: PT Amount of Missed Time (min): 13 Minutes PT Missed Treatment Reason: Patient fatigue Pain: Pain Assessment Pain Assessment: Faces Faces Pain Scale: Hurts even more Pain Type: Chronic pain Pain Location:  Back Pain Orientation: Lower Pain Descriptors / Indicators: Aching Pain Onset: On-going Pain Intervention(s): Repositioned;Rest   See Function Navigator for Current Functional Status.   Therapy/Group: Individual Therapy  Kerney ElbeVarner, Danyale Ridinger A 08/24/2015, 9:52 AM

## 2015-08-24 NOTE — Progress Notes (Signed)
Late entry:Pt removed IV catheter at beginning of shift. Also unable to keep initial morning medication down, vomited medication up shortly after administration. It was also reported that pt vomited a second time during therapy session that followed. Pt's son reported that pt has a history of vomiting prior to admission, and outpatient physicians have been unable to determine etiology. Throughout the reminder of shift, pt able to keep medication down, but vomited x 1 while attempting to eat.  MD aware.

## 2015-08-24 NOTE — Progress Notes (Addendum)
Discussed results of dopplers with Dr. Riley KillSwartz and Dr. Franky Machoabbell regarding anticoagulation. She was cleared to start treatment. Radiology consulted for input on best study for confirmation of B-femoral DVT and recommended that clinical judgement as well as repeat dopplers the best option for diagnosis. Patient is a high fall risk but is in a controlled setting at this time. Will start treatment with eliquis and repeat dopplers next week to monitor for propagation and definite diagnosis of femoral DVT.   Agree with the above. Spoke to patient and son at length. Will initiate eliquis and repeat dopplers in a week. I really would like to avoid either anticoagulation or an IVCF given her age, fall risk, and co-morbidities. Will keep her on bedrest until Sunday when she may resume full activities.   Ranelle OysterZachary T. Swartz, MD, Au Medical CenterFAAPMR [facility] 08/24/2015

## 2015-08-24 NOTE — Progress Notes (Signed)
[facility]ICINE & REHABILITATION     PROGRESS NOTE    Subjective/Complaints: Had fair night. No distress. Up with therapy at sink. .   ROS limited due to cognitive/communication deficits  Objective: Vital Signs: Blood pressure 147/52, pulse 99, temperature 98.5 F (36.9 C), temperature source Oral, resp. rate 18, height 5\' 3"  (1.6 m), weight 65.1 kg (143 lb 8.3 oz), SpO2 97 %. No results found.  Recent Labs  08/23/15 0450  WBC 7.6  HGB 8.9*  HCT 28.1*  PLT 289    Recent Labs  08/23/15 0450  NA 138  K 3.7  CL 106  GLUCOSE 104*  BUN 17  CREATININE 1.08*  CALCIUM 8.6*   CBG (last 3)   Recent Labs  08/23/15 1636 08/23/15 2124 08/24/15 0647  GLUCAP 72 140* 81    Wt Readings from Last 3 Encounters:  08/24/15 65.1 kg (143 lb 8.3 oz)  08/22/15 67 kg (147 lb 11.3 oz)  08/03/15 65.772 kg (145 lb)    Physical Exam:  Constitutional: She is oriented to person, place, and time. She appears well-developed and well-nourished.     HENT:  Head: Normocephalic and atraumatic.  Right Ear: No decreased hearing is noted.  Left Ear: No decreased hearing is noted.  Mouth/Throat: Mucous membranes still dry. Debris on teeth as well..  Ecchymosis left forehead nearly resolved Eyes: Conjunctivae are normal. Pupils are equal, round, and reactive to light.   Neck: Normal range of motion. Neck supple.  Cardiovascular: Normal rate and regular rhythm.  Respiratory: Effort normal and breath sounds normal. No respiratory distress. She has no wheezes.  GI: Soft. Bowel sounds are normal. She exhibits no distension. There is no tenderness. There is no rebound.  Musculoskeletal: low back tender with truncal movement and palpation  Neurological: She is alert and oriented to person, place, and time. Left facial weakness.    Able to state day (off by one), month, city, state. Reasonable insight and awareness. Tangential.  Skin: Skin is warm and dry.  Multiple scabbed  areas bilateral feet. Gluteal cleft with dry flaky skin.  Motor 4/5 UE's. LE's 2-3/5 at hips/knees partly due to pain. 4/5 ankles     Sensation intact to light touch in bilateral upper and lower limbs Skin: indurated area right forearm with dry dressing  Assessment/Plan: 1. Weakness, balance and cognitive deficits secondary to traumatic brain injury which require 3  hours per day of interdisciplinary therapy in a comprehensive inpatient rehab setting. (reduced to allow for fatigue,poor activity tolerance thus far) Physiatrist is providing close team supervision and 24 hour management of active medical problems listed below. Physiatrist and rehab team continue to assess barriers to discharge/monitor patient progress toward functional and medical goals.  Function:  Bathing Bathing position   Position: Wheelchair/chair at sink  Bathing parts Body parts bathed by patient: Right arm, Left arm, Chest, Abdomen, Right upper leg, Left upper leg Body parts bathed by helper: Buttocks, Right upper leg, Left upper leg, Right lower leg, Left lower leg, Back, Front perineal area  Bathing assist Assist Level: 2 helpers      Upper Body Dressing/Undressing Upper body dressing   What is the patient wearing?: Pull over shirt/dress     Pull over shirt/dress - Perfomed by patient: Thread/unthread right sleeve, Thread/unthread left sleeve, Put head through opening, Pull shirt over trunk          Upper body assist Assist Level: Set up   Set up : To obtain clothing/put away  Lower Body Dressing/Undressing Lower body dressing   What is the patient wearing?: Underwear, Pants, Non-skid slipper socks   Underwear - Performed by helper: Thread/unthread right underwear leg, Thread/unthread left underwear leg, Pull underwear up/down   Pants- Performed by helper: Thread/unthread right pants leg, Thread/unthread left pants leg, Pull pants up/down, Fasten/unfasten pants   Non-skid slipper socks- Performed by  helper: Don/doff right sock, Don/doff left sock                  Lower body assist Assist for lower body dressing: 2 Helpers      Toileting Toileting     Toileting steps completed by helper: Adjust clothing prior to toileting, Performs perineal hygiene, Adjust clothing after toileting Toileting Assistive Devices: Grab bar or rail  Toileting assist Assist level: Two helpers   Transfers Chair/bed transfer   Chair/bed transfer method: Stand pivot Chair/bed transfer assist level: Maximal assist (Pt 25 - 49%/lift and lower) Chair/bed transfer assistive device: Armrests     Locomotion Ambulation Ambulation activity did not occur: Safety/medical concerns   Max distance: 3' Assist level: 2 helpers (max A progressing to Tot A x 1, w/c follow x 2)   Wheelchair   Type: Manual Max wheelchair distance: 120 ft Assist Level: Supervision or verbal cues  Cognition Comprehension Comprehension assist level: Understands basic 50 - 74% of the time/ requires cueing 25 - 49% of the time  Expression Expression assist level: Expresses basic 50 - 74% of the time/requires cueing 25 - 49% of the time. Needs to repeat parts of sentences.  Social Interaction Social Interaction assist level: Interacts appropriately 50 - 74% of the time - May be physically or verbally inappropriate.  Problem Solving Problem solving assist level: Solves basic 25 - 49% of the time - needs direction more than half the time to initiate, plan or complete simple activities  Memory Memory assist level: Recognizes or recalls 25 - 49% of the time/requires cueing 50 - 75% of the time   Medical Problem List and Plan: 1. Cognitive deficits, gait disorder secondary to Traumatic frontal intracranial hemorrhage, subarachnoid hemorrhage and subdural hemorrhage  -therapy at 3hr per day 2. DVT Prophylaxis/Anticoagulation: Mechanical: Sequential compression devices, below knee Bilateral lower extremities 3. Chronic back pain/Pain  Management: Used hydrocodone tid due to back and LE pain. Last [facility] 07/16/15.   -scheduled hydrocodone to help with activity tolerance     4. Mood: LCSW to follow for evaluation and support.  5. Neuropsych: This patient is not fully capable of making decisions on her own behalf. 6. Skin/Wound Care: Routine pressure relief measures. Maintain adequate nutrition and hyrdration status.  7. Fluids/Electrolytes/Nutrition: Continue Dysphagia 1, honey liquids as swallow function shows minimal improvement. IVF at night and push fluids during the day.   8. Acute on chronic renal failure: Cr now down to 1.08--continue on IVF over the weekend 9. HTN: Monitor tid for now.  10. Acute on chronic Iron deficiency anemia: Added iron supplement. hgb 8.9 today,  i would assume some of this is dilutional in addtion to #13 11. Aspiration PNA/H flu PNA: Has completed antibiotic course.  12. 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 Continue amiodarone daily and lopressor bid. To follow up with [facility] after discharge.  13. Rectal bleeding: Has resolved--monitor for recurrence. Will need follow up evaluation after discharge.  14. Depression: Used to take Celexa and Abilify PTA with good results per family.  15. Urinary retention?: Foley replaced due to poor UOP---continue for  now. 16. Cellulitis right forearm: doxycycline and dry dressing---will dc after today's doses---area looks like hematoma   LOS (Days) 2 A FACE TO FACE EVALUATION WAS PERFORMED  SWARTZ,ZACHARY T 08/24/2015 10:19 AM

## 2015-08-24 NOTE — Progress Notes (Signed)
Speech Language Pathology Daily Session Note  Patient Details  Name: Lisa [name] MRN: [id number] Date of Birth: 06/17/1927  Today's Date: 08/24/2015 SLP Individual Time: 1300-1350 SLP Individual Time Calculation (min): 50 min  Short Term Goals: Week 1: SLP Short Term Goal 1 (Week 1): Patient will consume current diet with minimal overt s/s of aspiration with Min A verbal cues for use of swallow strategies.  SLP Short Term Goal 2 (Week 1): Patient will consume trials of nectar-thick liqiuds with minimal overt s/s of aspiration with Min A verbal cues over 3 consecutive sessions to assess readiness for repeat MBS SLP Short Term Goal 3 (Week 1): Patient will sustain attention to a functional task for 5 minutes with Mod A verbal cues for redirection.  SLP Short Term Goal 4 (Week 1): Patient will identify 2 physical and 2 cognitive deficits with Mod A question and semantic cues.  SLP Short Term Goal 5 (Week 1): Patient will initiate functional tasks with no more than 1 visual and 1 verbal cue in 75% of opportunities.  SLP Short Term Goal 6 (Week 1): Patient will demonstrate functional problem solving for basic and familiar tasks with Mod A verbal cues.   Skilled Therapeutic Interventions: Skilled treatment session focused on cognitive goals. Upon arrival, patient reported she had been vomiting and was not currently hungry, therefore, trials of upgraded textures/liquids were not attempted. SLP facilitated session by providing Mod A question and verbal cues for functional problem solving with a basic money management task. Patient also participated in a basic written expression task with Mod I. Patient reporting back pain throughout session due to positioning in bed (currently on bed rest) and required frequent rest breaks with readjustment of the bed.  Patient's pain provided distraction throughout the session and patient required Mod A for sustained attention to tasks for ~2 minutes but initiated tasks  with supervision verbal cue X 1.  Patient eventually fell asleep during last rest break, therefore, patient missed the last 10 minutes of the session. Patient left supine in bed with son present. Continue with current plan of care.    Function:  Cognition Comprehension Comprehension assist level: Understands basic 75 - 89% of the time/ requires cueing 10 - 24% of the time  Expression   Expression assist level: Expresses basic 50 - 74% of the time/requires cueing 25 - 49% of the time. Needs to repeat parts of sentences.  Social Interaction Social Interaction assist level: Interacts appropriately 50 - 74% of the time - May be physically or verbally inappropriate.  Problem Solving Problem solving assist level: Solves basic 50 - 74% of the time/requires cueing 25 - 49% of the time  Memory Memory assist level: Recognizes or recalls 50 - 74% of the time/requires cueing 25 - 49% of the time    Pain Pain in back, patient premedicated and patient repositioned.   Therapy/Group: Individual Therapy  Greogry Goodwyn 08/24/2015, 4:39 PM

## 2015-08-24 NOTE — Progress Notes (Signed)
*  Preliminary Results* Bilateral lower extremity venous duplex completed. Study was technically difficult due to patient body habitus.  The right lower extremity is positive for acute deep vein thrombosis involving the right gastrocnemius and peroneal veins. There is questionable deep vein thrombosis involving bilateral distal femoral veins.  There is no evidence of Baker's cyst bilaterally.  Preliminary results discussed with Scheryl MartenVerna, RN.  08/24/2015  Gertie FeyMichelle Kamill Fulbright, RVT, RDCS, RDMS

## 2015-08-24 NOTE — Progress Notes (Signed)
ANTICOAGULATION CONSULT NOTE - Initial Consult  Pharmacy Consult for Apixaban Indication: DVT  Allergies  Allergen Reactions  . Ambien [Zolpidem Tartrate] Other (See Comments)    Reaction:  Hallucinations   . Daypro [Oxaprozin] Other (See Comments)    GI upset  . Oxycodone Nausea And Vomiting  . Shellfish Allergy Swelling and Other (See Comments)    Pts mouth and face swells.   . Ace Inhibitors Cough  . Norvasc [Amlodipine Besylate] Palpitations    Patient Measurements: Height: 5\' 3"  (160 cm) Weight: 143 lb 8.3 oz (65.1 kg) IBW/kg (Calculated) : 52.4 Heparin Dosing Weight:   Vital Signs: Temp: 98.8 F (37.1 C) (12/02 1428) Temp Source: Oral (12/02 1428) BP: 137/62 mmHg (12/02 1428) Pulse Rate: 89 (12/02 1428)  Labs:  Recent Labs  08/23/15 0450  HGB 8.9*  HCT 28.1*  PLT 289  CREATININE 1.08*    Estimated Creatinine Clearance: 33.9 mL/min (by C-G formula based on Cr of 1.08).   Medical History: Past Medical History  Diagnosis Date  . Allergy   . Pelvis fracture (HCC)   . Wrist fracture, right   . Depression   . Spinal stenosis   . Osteoarthritis   . Hypertension   . Foot fracture     bilateral    Medications:  Scheduled:  . amiodarone  100 mg Oral Daily  . apixaban  5 mg Oral BID  . calcium-vitamin D  1 tablet Oral TID WC  . citalopram  20 mg Oral Daily  . doxycycline  100 mg Oral Q12H  . HYDROcodone-acetaminophen  1 tablet Oral BID WC  . insulin aspart  0-9 Units Subcutaneous TID WC  . metoprolol tartrate  12.5 mg Oral BID WC  . multivitamin-lutein  1 capsule Oral BID  . pantoprazole  40 mg Oral Daily  . RESOURCE THICKENUP CLEAR   Oral Once    Assessment: 81yo female admitted mid-November with a subarachnoid hemorrhage and subdural hematoma due to fall.  She also had rectal bleeding in late November, now resolved, and currently has a hematoma on her forearm.  She is now admitted to Rehab and dopplers were (+)RLE DVT and possible B-femoral vein  DVTs.  She is to start Apixaban per pharmacy; 5mg  po BID has been ordered by Rehab team.  This has also been okayed by Dr. Franky Machoabbell.  12/1:  Cr 1.08 (decreased from previous range of 1.17-1.53), Hg 8.9, pltc wnl  CrCl ~4233ml/min  Dosing for Acute DVT with Apixaban is 10mg  po bid x 7 days, then 5mg  bid.  Given significant and recent bleeding hx, I will not adjust dosing.  Goal of Therapy:  treatment of DVT Monitor platelets by anticoagulation protocol: Yes   Plan:  Continue Apixaban 5mg  po BID Watch for s/s of bleeding Check CBC tomorrow Consider checking CBC twice weekly Watch renal fxn  Marisue HumbleKendra Tanaka Gillen, PharmD Clinical Pharmacist [facility]

## 2015-08-24 NOTE — IPOC Note (Signed)
Overall Plan of Care [facility](IPOC) Patient Details Name: Lisa [name] MRN: [id number] DOB: 11/02/1926  Admitting Diagnosis: TBI after a fall  Hospital Problems: Active Problems:   TBI (traumatic brain injury) [facility](HCC)     Functional Problem List: Nursing Bladder, Bowel, Endurance, Medication Management, Nutrition, Safety, Skin Integrity, Pain  PT Balance, Endurance, Motor, Nutrition, Pain, Safety, Skin Integrity  OT Balance, Endurance, Motor, Safety, Pain, Cognition  SLP Cognition  TR         Basic ADL's: OT Grooming, Bathing, Dressing, Toileting     Advanced  ADL's: OT       Transfers: PT Bed Mobility, Bed to Chair, Car, Occupational psychologisturniture  OT Toilet, Research scientist (life sciences)Tub/Shower     Locomotion: PT Ambulation, Psychologist, prison and probation servicesWheelchair Mobility, Stairs     Additional Impairments: OT None  SLP Swallowing, Social Cognition   Social Interaction, Problem Solving, Memory, Attention, Awareness  TR      Anticipated Outcomes Item Anticipated Outcome  Self Feeding n/a  Swallowing  Supervision with least restrictive diet    Basic self-care  supervision - min A  Toileting  min A   Bathroom Transfers min A  Bowel/Bladder  Continent bowel and bladder with timed toileting; cueing to anticipate needs.  Transfers  supervision  Locomotion  min A household ambulator  Communication     Cognition  Min A   Pain  Managed at goal 3/10.  Safety/Judgment  Increased safety awareness; no falls, injury this admission, anticipates needs.   Therapy Plan: PT Intensity: Minimum of 1-2 x/day ,45 to 90 minutes PT Frequency: 5 out of 7 days PT Duration Estimated Length of Stay: 21-24 days OT Intensity: Minimum of 1-2 x/day, 45 to 90 minutes OT Frequency: 5 out of 7 days OT Duration/Estimated Length of Stay: 21-24 days SLP Intensity: Minumum of 1-2 x/day, 30 to 90 minutes SLP Frequency: 3 to 5 out of 7 days SLP Duration/Estimated Length of Stay: 21-24 days        Team Interventions: Nursing Interventions Patient/Family  Education, Medication Management, Bladder Management, Skin Care/Wound Management, Bowel Management, Cognitive Remediation/Compensation, Discharge Planning, Disease Management/Prevention  PT interventions Ambulation/gait training, Balance/vestibular training, Cognitive remediation/compensation, Community reintegration, Discharge planning, Disease management/prevention, DME/adaptive equipment instruction, Functional mobility training, Neuromuscular re-education, Pain management, Patient/family education, Psychosocial support, Stair training, Therapeutic Activities, Therapeutic Exercise, UE/LE Strength taining/ROM, UE/LE Coordination activities, Wheelchair propulsion/positioning  OT Interventions Warden/rangerBalance/vestibular training, Fish farm managerDME/adaptive equipment instruction, Patient/family education, Therapeutic Activities, Therapeutic Exercise, Wheelchair propulsion/positioning, Cognitive remediation/compensation, Community reintegration, Functional mobility training, Self Care/advanced ADL retraining, UE/LE Strength taining/ROM, Discharge planning, Pain management, UE/LE Coordination activities  SLP Interventions Cognitive remediation/compensation, Cueing hierarchy, Functional tasks, Environmental controls, Internal/external aids, Dysphagia/aspiration precaution training, Therapeutic Activities, Patient/family education  TR Interventions    SW/CM Interventions Psychosocial Support, Discharge Planning, Patient/Family Education    Team Discharge Planning: Destination: PT-Home ,OT- Home , SLP-Home Projected Follow-up: PT-Home health PT, 24 hour supervision/assistance, OT-  Home health OT, 24 hour supervision/assistance, SLP-Home Health SLP, Outpatient SLP, 24 hour supervision/assistance Projected Equipment Needs: PT-To be determined, Wheelchair (measurements), Wheelchair cushion (measurements), OT- 3 in 1 bedside comode, Tub/shower seat, SLP-To be determined Equipment Details: PT-per son, patient has RW and SPC, OT-   Patient/family involved in discharge planning: PT- Family Adult nursemember/caregiver, Patient,  OT-Patient, Family Adult nursemember/caregiver, SLP-Patient, Family member/caregiver  MD ELOS: 21-24 days Medical Rehab Prognosis:  Excellent Assessment: The patient has been admitted for CIR therapies with the diagnosis of TBI. The team will be addressing functional mobility, strength, stamina, balance, safety, adaptive techniques and equipment, self-care, bowel and  bladder mgt, patient and caregiver education, NMR, visual perceptual awareness, pain control, community reintegration. Goals have been set at supervision to min assist for mobility, transfers, ADL's, self-care, cognition, and communication. Progress will be temporarily slowed by DVT's.    Ranelle Oyster, MD, FAAPMR      See Team Conference Notes for weekly updates to the plan of care

## 2015-08-24 NOTE — Progress Notes (Signed)
Occupational Therapy Session Note  Patient Details  Name: Marita SnellenFaye K Sherburn MRN: [id number] Date of Birth: 03/18/1927  Today's Date: 08/24/2015 OT Individual Time:  - 1400-1430  (30min)      Short Term Goals: Week 1:  OT Short Term Goal 1 (Week 1): Pt will be engaged in 10 minutes of functional self care tasks with requiring less than 1 rest break . OT Short Term Goal 2 (Week 1): Pt will perform LB dressing with max A in order to decrease level of assist needed with self care. OT Short Term Goal 3 (Week 1): Pt will perform UB dressing with min A in order to decrease level of assist needed with self care. OT Short Term Goal 4 (Week 1): Pt will perform toilet transfer with mod A in order to increase I with functional transfers.     :     Skilled Therapeutic Interventions/Progress Updates:    Pt. On bed rest.  Complained of back, pelvic pain 5/10.  Did not want pain pill but agreed to myotherapy.  OT performed light myotherapy to low back and pelvis.  She reported appreciation and stated is felt better.  Left pt in bed with son and  call bell,phone within reach.    Therapy Documentation Precautions:  Precautions Precautions: Fall Restrictions Weight Bearing Restrictions: No      Pain:  5/10 back   :      :    See Function Navigator for Current Functional Status.   Therapy/Group: Individual Therapy  Humberto Sealsdwards, Orlena Garmon J 08/24/2015, 7:59 AM

## 2015-08-24 NOTE — Progress Notes (Signed)
Occupational Therapy Session Note  Patient Details  Name: Lisa SnellenFaye K [name] MRN: [id number] Date of Birth: 08/10/1927  Today's Date: 08/24/2015 OT Co-Treatment Time: 0800-0830 OT Co-Treatment Time Calculation (min): 30 min   Short Term Goals: Week 1:  OT Short Term Goal 1 (Week 1): Pt will be engaged in 10 minutes of functional self care tasks with requiring less than 1 rest break . OT Short Term Goal 2 (Week 1): Pt will perform LB dressing with max A in order to decrease level of assist needed with self care. OT Short Term Goal 3 (Week 1): Pt will perform UB dressing with min A in order to decrease level of assist needed with self care. OT Short Term Goal 4 (Week 1): Pt will perform toilet transfer with mod A in order to increase I with functional transfers.  Skilled Therapeutic Interventions/Progress Updates:    Co tx with PT.  Pt resting in bed upon arrival with son present.  Pt oriented X 4. Pt required max A for stand pivot transfer to w/c to engaged in BADL retraining including bathing and dressing with sit<>stand from w/c at sink.  RN administered meds during session and pt became nauseous with moderate emesis.  Pt required mod A for sit<>stand at sink.  Pt required assistance with donning pants.  Pt also engaged in w/c mobility.  Pt required more than a reasonable amount of time with multiple rest breaks during session.  Pt remained in w/c with son present.  Therapy Documentation Precautions:  Precautions Precautions: Fall Restrictions Weight Bearing Restrictions: No Pain: Pain Assessment Pain Assessment: No/denies pain  See Function Navigator for Current Functional Status.   Therapy/Group: Individual Therapy  Rich BraveLanier, Zlaty Alexa Chappell 08/24/2015, 9:03 AM

## 2015-08-24 NOTE — Progress Notes (Signed)
Physical Therapy Note  Patient Details  Name: Lisa [name] MRN: [id number] Date of Birth: 01/31/1927 Today's Date: 08/24/2015    Time: 800-900 60 minute co-tx with OT  2:1 No c/o pain, pt c/o nausea, RN aware.  Bed mobility with max A. Bed to w/c transfer stand pivot with max A, cues for UE placement, assistance due to posterior lean, assistance for foot placement.  Pt performed washing up and dressing at sink with assistance for retrieving items, pt with good sequencing, no apraxia noted.  Pt requires max A for sit to stand multiple attempts for lower body dressing, +2 for clothing management with standing tasks.  Pt fatigues easily and requires frequent rest breaks. Pt able to propel w/c with supervision cuing for straight propulsion, hand over hand assist for turning.  Pt with episode of vomiting during session, RN aware.   DONAWERTH,KAREN 08/24/2015, 8:59 AM

## 2015-08-25 ENCOUNTER — Inpatient Hospital Stay (HOSPITAL_COMMUNITY): Payer: Medicare Other | Admitting: *Deleted

## 2015-08-25 ENCOUNTER — Inpatient Hospital Stay (HOSPITAL_COMMUNITY): Payer: Medicare Other | Admitting: Occupational Therapy

## 2015-08-25 ENCOUNTER — Inpatient Hospital Stay (HOSPITAL_COMMUNITY): Payer: Medicare Other | Admitting: Speech Pathology

## 2015-08-25 DIAGNOSIS — I82413 Acute embolism and thrombosis of femoral vein, bilateral: Secondary | ICD-10-CM

## 2015-08-25 LAB — HEMOGLOBIN A1C
HEMOGLOBIN A1C: 5.9 % — AB (ref 4.8–5.6)
MEAN PLASMA GLUCOSE: 123 mg/dL

## 2015-08-25 LAB — GLUCOSE, CAPILLARY
GLUCOSE-CAPILLARY: 94 mg/dL (ref 65–99)
GLUCOSE-CAPILLARY: 129 mg/dL — AB (ref 65–99)
Glucose-Capillary: 73 mg/dL (ref 65–99)
Glucose-Capillary: 98 mg/dL (ref 65–99)

## 2015-08-25 LAB — URINE CULTURE: Culture: >=100000

## 2015-08-25 NOTE — Progress Notes (Signed)
[facility] son reported patient complained of feeling SOB, O2 sat 98% on RA. No respiratory distress observed.  1 vicodin given at 2027 for complaint of bilateral feet hurting. Multi scabs to bilateral feet, scabs with redness to toes on left foot. +/- sleep during night. No attempts OOB. Without complaints of N & V during night. Alfredo MartinezMurray, Delta Pichon A

## 2015-08-25 NOTE — Progress Notes (Signed)
Speech Language Pathology Daily Session Note  Patient Details  Name: Lisa [name] MRN: [id number] Date of Birth: 06/03/1927  Today's Date: 08/25/2015 SLP Individual Time:  -     Short Term Goals: Week 1: SLP Short Term Goal 1 (Week 1): Patient will consume current diet with minimal overt s/s of aspiration with Min A verbal cues for use of swallow strategies.  SLP Short Term Goal 2 (Week 1): Patient will consume trials of nectar-thick liqiuds with minimal overt s/s of aspiration with Min A verbal cues over 3 consecutive sessions to assess readiness for repeat MBS SLP Short Term Goal 3 (Week 1): Patient will sustain attention to a functional task for 5 minutes with Mod A verbal cues for redirection.  SLP Short Term Goal 4 (Week 1): Patient will identify 2 physical and 2 cognitive deficits with Mod A question and semantic cues.  SLP Short Term Goal 5 (Week 1): Patient will initiate functional tasks with no more than 1 visual and 1 verbal cue in 75% of opportunities.  SLP Short Term Goal 6 (Week 1): Patient will demonstrate functional problem solving for basic and familiar tasks with Mod A verbal cues.   Skilled Therapeutic Interventions: Skilled ST intervention provided with focus on cognitive and dysphagia goals.  Pt seen in room with breakfast meal and son present. Slp repositioned pt to upright 90 degrees in bed to increase swallow safety. Nurse and son reported that pt has not been eating much. Slp provided max verbal encouragement to increase meal consumption. Pt consumed 50% of magic cup with no difficulties noted. Min verbal cues required to utilize appropriate swallow strategies. Pt recalled 2 reasons to use call bell, mod verbal cues required. Pt limited by lethargy this session. Mod verbal cues required throughout therapy session to redirect.    Function:  Eating Eating                 Cognition Comprehension    Expression      Social Interaction    Problem Solving    Memory       Pain Pain Assessment Pain Assessment: No/denies pain Pain Score: 5  Pain Type: Chronic pain Pain Location: Back Pain Orientation: Lower Pain Descriptors / Indicators: Aching Pain Frequency: Intermittent Pain Onset: Gradual Pain Intervention(s): Medication (See eMAR)  Therapy/Group: Individual Therapy   Jon, Kara PacerLeah N 08/25/2015, 4:13 PM

## 2015-08-25 NOTE — Progress Notes (Signed)
Marita SnellenFaye K Marcon is a 81 y.o. female 02/18/1927 [id number]  Subjective: Reports feeling tired due to no sleep. Son reports pt sleeping later afternoon>night. Pt otherwise feeling OK.  Objective: Vital signs in last 24 hours: Temp:  [97.8 F (36.6 C)-98.8 F (37.1 C)] 97.8 F (36.6 C) (12/03 0540) Pulse Rate:  [82-89] 87 (12/03 0540) Resp:  [16-18] 18 (12/03 0540) BP: (137-146)/(52-62) 146/52 mmHg (12/03 0540) SpO2:  [97 %-99 %] 97 % (12/03 0540) Weight:  [65.2 kg (143 lb 11.8 oz)] 65.2 kg (143 lb 11.8 oz) (12/03 0540) Weight change: -3.6 kg (-7 lb 15 oz) Last BM Date: 08/24/15  Intake/Output from previous day: 12/02 0701 - 12/03 0700 In: 300 [P.O.:300] Out: 1100 [Urine:1100]  Physical Exam General: No apparent distress   In bed Lungs: Normal effort. Lungs clear to auscultation, no crackles or wheezes. Cardiovascular: Regular rate and rhythm, no edema Neurological: No new neurological deficits Wounds: N/A      Lab Results: BMET    Component Value Date/Time   NA 138 08/23/2015 0450   NA 130* 10/31/2013 0858   K 3.7 08/23/2015 0450   K 4.4 10/31/2013 0858   CL 106 08/23/2015 0450   CL 103 10/31/2013 0858   CO2 25 08/23/2015 0450   CO2 22 10/31/2013 0858   GLUCOSE 104* 08/23/2015 0450   GLUCOSE 118* 10/31/2013 0858   BUN 17 08/23/2015 0450   BUN 30* 10/31/2013 0858   CREATININE 1.08* 08/23/2015 0450   CREATININE 1.43* 10/31/2013 0858   CALCIUM 8.6* 08/23/2015 0450   CALCIUM 9.1 10/31/2013 0858   GFRNONAA 45* 08/23/2015 0450   GFRNONAA 34* 10/31/2013 0858   GFRAA 52* 08/23/2015 0450   GFRAA 39* 10/31/2013 0858   CBC    Component Value Date/Time   WBC 7.6 08/23/2015 0450   WBC 12.0* 10/31/2013 0858   RBC 2.94* 08/23/2015 0450   RBC 3.68* 10/31/2013 0858   HGB 8.9* 08/23/2015 0450   HGB 11.4* 10/31/2013 0858   HCT 28.1* 08/23/2015 0450   HCT 33.9* 10/31/2013 0858   PLT 289 08/23/2015 0450   PLT 218 10/31/2013 0858   MCV 95.6 08/23/2015 0450   MCV 92  10/31/2013 0858   MCH 30.3 08/23/2015 0450   MCH 31.0 10/31/2013 0858   MCHC 31.7 08/23/2015 0450   MCHC 33.5 10/31/2013 0858   RDW 17.3* 08/23/2015 0450   RDW 16.5* 10/31/2013 0858   LYMPHSABS 0.7 08/23/2015 0450   LYMPHSABS 0.9* 10/31/2013 0858   MONOABS 0.6 08/23/2015 0450   MONOABS 0.6 10/31/2013 0858   EOSABS 0.2 08/23/2015 0450   EOSABS 0.0 10/31/2013 0858   BASOSABS 0.0 08/23/2015 0450   BASOSABS 0.1 10/31/2013 0858   CBG's (last 3):    Recent Labs  08/24/15 1649 08/24/15 2209 08/25/15 0639  GLUCAP 81 88 73   LFT's Lab Results  Component Value Date   ALT 38 08/23/2015   AST 41 08/23/2015   ALKPHOS 67 08/23/2015   BILITOT 1.0 08/23/2015    Studies/Results: No results found.  Medications:  I have reviewed the patient's current medications. Scheduled Medications: . amiodarone  100 mg Oral Daily  . apixaban  5 mg Oral BID  . calcium-vitamin D  1 tablet Oral TID WC  . citalopram  20 mg Oral Daily  . doxycycline  100 mg Oral Q12H  . HYDROcodone-acetaminophen  1 tablet Oral BID WC  . insulin aspart  0-9 Units Subcutaneous TID WC  . metoprolol tartrate  12.5 mg Oral BID WC  .  multivitamin-lutein  1 capsule Oral BID  . pantoprazole  40 mg Oral Daily  . RESOURCE THICKENUP CLEAR   Oral Once   PRN Medications: acetaminophen, alum & mag hydroxide-simeth, bisacodyl, diphenhydrAMINE, guaiFENesin-dextromethorphan, HYDROcodone-acetaminophen, lidocaine, methocarbamol, ondansetron **AND** ondansetron, senna-docusate, sodium chloride, sodium phosphate, traZODone  Assessment/Plan: Active Problems:   TBI (traumatic brain injury) (HCC)  1. 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 B fem DVT, dx on doppler 12/2 - after discussion with Cabbell (NSurg), Eliquis intiated 12/2 - plan continuation with repeat doppler f/u in 1 week (12/9) -  benefit>risk anticoag reivewed 3. Chronic back pain/Pain Management: Used hydrocodone tid PTA due to back and LE pain. Last [facility] 07/16/15.  -scheduled hydrocodone to help with activity tolerance  4. Mood: LCSW to follow for evaluation and support.  5. Neuropsych: This patient is not fully capable of making decisions on her own behalf. 6. Skin/Wound Care: Routine pressure relief measures. Maintain adequate nutrition and hyrdration status.  7. Fluids/Electrolytes/Nutrition: Continue Dysphagia 1, honey liquids as swallow function shows minimal improvement. IVF at night and push fluids during the day.   8. Acute on chronic renal failure: Cr now down to 1.08--continue on IVF over the weekend 9. HTN: Monitor tid for now.  10. Acute on chronic Iron deficiency anemia: on iron supplement. No evidence for acute or ongoing bleeding 11. Aspiration PNA/H flu PNA: Has completed antibiotic course.  12. 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 Continue amiodarone daily and lopressor bid. To follow up with [facility] after discharge.  13. Rectal bleeding: Has resolved--monitor for recurrence. Will need follow up evaluation after discharge.  14. Depression: Used to take Celexa and Abilify PTA with good results per family.  15. Urinary retention: Foley replaced due to poor UOP---continue for now. 16. S/p doxy course for Cellulitis right forearm: continue dry dressing-area looks like hematoma   Length of stay, days: 3   Valerie A. Felicity Coyer, MD 08/25/2015, 11:01 AM

## 2015-08-25 NOTE — Progress Notes (Signed)
Occupational Therapy Session Note  Patient Details  Name: Lisa [name] MRN: [id number] Date of Birth: 06/19/1927  Today's Date: 08/25/2015 OT Individual Time:  -   0900-1000  (5960min)1st session      Short Term Goals: Week 1:  OT Short Term Goal 1 (Week 1): Pt will be engaged in 10 minutes of functional self care tasks with requiring less than 1 rest break . OT Short Term Goal 2 (Week 1): Pt will perform LB dressing with max A in order to decrease level of assist needed with self care. OT Short Term Goal 3 (Week 1): Pt will perform UB dressing with min A in order to decrease level of assist needed with self care. OT Short Term Goal 4 (Week 1): Pt will perform toilet transfer with mod A in order to increase I with functional transfers.      Skilled Therapeutic Interventions/Progress Updates:   1st session:   Focus of treatment was bed mobility while engaging in bathing and dressing.  Pt on bedrest until Sunday.  Performed bathing and dressing in bed with patient rolling from right to left for washing and donning pants.  Sat with HOB up at 45 to don and doff shirt.  Pt sleepy but became more alert as session progressed.  Left pt in bed call bell,phone within reach.    Time:  1400-1430  (30 min) Pain: back 3/10 Individual session:  Pt. Engaged in UE AROM while lying in bed.  Pt pulled self up to top of bed with min assist.  Engaged in brushing teeth.  Addressed BUE AROM, and self care.  Son present and noted pt more participatory in session       Therapy Documentation Precautions:  Precautions Precautions: Fall Restrictions Weight Bearing Restrictions: No   Pain:  1st session  3/10 back           See Function Navigator for Current Functional Status.   Therapy/Group: Individual Therapy  Humberto Sealsdwards, Tena Linebaugh J 08/25/2015, 9:32 AM

## 2015-08-25 NOTE — Progress Notes (Signed)
Physical Therapy Session Note  Patient Details  Name: Lisa [name] MRN: [id number] Date of Birth: 12/25/1926  Today's Date: 08/25/2015 PT Individual Time: 1105-1150 PT Individual Time Calculation (min): 45 min     Skilled Therapeutic Interventions/Progress Updates:  Patient continues on bed rest , but agrees to therapy interventions in supine Patient guided in therapeutic exercises in active manner in all planes of motion in supine and side lying  Performed all exercises in 2 sets of 15: SLR, resisted knee /hip extension, hip abd and add, heel slides, bridging, UE shoulder flexion , biceps curls, ankle pumps. Training in rolling in bed R and L multiple times to increase ability to reposition, scooting up with mod A.  Patient's son in room , very agitated when his Mom asks to go to the bathroom, explained that it might be a sensation she is feeling despite having a catheter.  Patient very fatigues with exercises needs frequent breaks and encouragement to participate.  Session short 15 min due to patient fatigue.  Therapy Documentation Precautions:  Precautions Precautions: Fall Restrictions Weight Bearing Restrictions: No Vital Signs: BP 142/63 SaO2 96% HR 87  Pain: Pain Assessment Pain Assessment: No/denies pain Pain Score: 0-No pain Pain Type: Chronic pain Pain Location: Back Pain Orientation: Lower Pain Descriptors / Indicators: Aching Pain Frequency: Intermittent Pain Onset: Gradual Pain Intervention(s): Medication (See eMAR)   See Function Navigator for Current Functional Status.   Therapy/Group: Individual Therapy  Dorna MaiCzajkowska, [name] 08/25/2015, 12:26 PM

## 2015-08-26 ENCOUNTER — Inpatient Hospital Stay (HOSPITAL_COMMUNITY): Payer: Medicare Other | Admitting: Physical Therapy

## 2015-08-26 LAB — GLUCOSE, CAPILLARY
Glucose-Capillary: 81 mg/dL (ref 65–99)
Glucose-Capillary: 130 mg/dL — ABNORMAL HIGH (ref 65–99)
Glucose-Capillary: 84 mg/dL (ref 65–99)
Glucose-Capillary: 115 mg/dL — ABNORMAL HIGH (ref 65–99)

## 2015-08-26 MED ORDER — ALPRAZOLAM 0.25 MG PO TABS
0.2500 mg | ORAL_TABLET | Freq: Three times a day (TID) | ORAL | Status: DC | PRN
  Administered 2015-08-26 – 2015-09-11 (×9): 0.25 mg via ORAL
  Filled 2015-08-26 (×9): qty 1

## 2015-08-26 NOTE — Progress Notes (Signed)
Lisa [name] is a 81 y.o. female 08-Jun-1927 [id number]  Subjective: No new concerns. Feels ok despite pt report of poor sleep  Objective: Vital signs in last 24 hours: Temp:  [98 F (36.7 C)-98.2 F (36.8 C)] 98 F (36.7 C) (12/04 0546) Pulse Rate:  [76-87] 76 (12/04 0741) Resp:  [18] 18 (12/04 0546) BP: (122-133)/(55-66) 132/66 mmHg (12/04 0741) SpO2:  [98 %-100 %] 100 % (12/04 0546) Weight:  [65.7 kg (144 lb 13.5 oz)] 65.7 kg (144 lb 13.5 oz) (12/04 0546) Weight change: 0.5 kg (1 lb 1.6 oz) Last BM Date: 08/24/15  Intake/Output from previous day: 12/03 0701 - 12/04 0700 In: 360 [P.O.:360] Out: 1025 [Urine:1025]  Physical Exam General: No apparent distress   In WC Lungs: Normal effort. Lungs clear to auscultation, no crackles or wheezes. Cardiovascular: Regular rate and rhythm, no edema Neurological: No new neurological deficits     Lab Results: BMET    Component Value Date/Time   NA 138 08/23/2015 0450   NA 130* 10/31/2013 0858   K 3.7 08/23/2015 0450   K 4.4 10/31/2013 0858   CL 106 08/23/2015 0450   CL 103 10/31/2013 0858   CO2 25 08/23/2015 0450   CO2 22 10/31/2013 0858   GLUCOSE 104* 08/23/2015 0450   GLUCOSE 118* 10/31/2013 0858   BUN 17 08/23/2015 0450   BUN 30* 10/31/2013 0858   CREATININE 1.08* 08/23/2015 0450   CREATININE 1.43* 10/31/2013 0858   CALCIUM 8.6* 08/23/2015 0450   CALCIUM 9.1 10/31/2013 0858   GFRNONAA 45* 08/23/2015 0450   GFRNONAA 34* 10/31/2013 0858   GFRAA 52* 08/23/2015 0450   GFRAA 39* 10/31/2013 0858   CBC    Component Value Date/Time   WBC 7.6 08/23/2015 0450   WBC 12.0* 10/31/2013 0858   RBC 2.94* 08/23/2015 0450   RBC 3.68* 10/31/2013 0858   HGB 8.9* 08/23/2015 0450   HGB 11.4* 10/31/2013 0858   HCT 28.1* 08/23/2015 0450   HCT 33.9* 10/31/2013 0858   PLT 289 08/23/2015 0450   PLT 218 10/31/2013 0858   MCV 95.6 08/23/2015 0450   MCV 92 10/31/2013 0858   MCH 30.3 08/23/2015 0450   MCH 31.0 10/31/2013 0858   MCHC  31.7 08/23/2015 0450   MCHC 33.5 10/31/2013 0858   RDW 17.3* 08/23/2015 0450   RDW 16.5* 10/31/2013 0858   LYMPHSABS 0.7 08/23/2015 0450   LYMPHSABS 0.9* 10/31/2013 0858   MONOABS 0.6 08/23/2015 0450   MONOABS 0.6 10/31/2013 0858   EOSABS 0.2 08/23/2015 0450   EOSABS 0.0 10/31/2013 0858   BASOSABS 0.0 08/23/2015 0450   BASOSABS 0.1 10/31/2013 0858   CBG's (last 3):    Recent Labs  08/25/15 1630 08/25/15 2112 08/26/15 0644  GLUCAP 94 129* 81   LFT's Lab Results  Component Value Date   ALT 38 08/23/2015   AST 41 08/23/2015   ALKPHOS 67 08/23/2015   BILITOT 1.0 08/23/2015    Studies/Results: No results found.  Medications:  I have reviewed the patient's current medications. Scheduled Medications: . amiodarone  100 mg Oral Daily  . apixaban  5 mg Oral BID  . calcium-vitamin D  1 tablet Oral TID WC  . citalopram  20 mg Oral Daily  . doxycycline  100 mg Oral Q12H  . HYDROcodone-acetaminophen  1 tablet Oral BID WC  . insulin aspart  0-9 Units Subcutaneous TID WC  . metoprolol tartrate  12.5 mg Oral BID WC  . multivitamin-lutein  1 capsule Oral BID  .  pantoprazole  40 mg Oral Daily  . RESOURCE THICKENUP CLEAR   Oral Once   PRN Medications: acetaminophen, alum & mag hydroxide-simeth, bisacodyl, diphenhydrAMINE, guaiFENesin-dextromethorphan, HYDROcodone-acetaminophen, lidocaine, methocarbamol, ondansetron **AND** ondansetron, senna-docusate, sodium chloride, sodium phosphate, traZODone  Assessment/Plan: Active Problems:   TBI (traumatic brain injury) (HCC)  1. 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 B fem DVT, dx on doppler 12/2 - after discussion with Cabbell (NSurg), Eliquis intiated 12/2 - plan continuation with repeat doppler f/u in 1 week (12/9) - benefit>risk anticoag reivewed, resume normal activity today (12/4) 3. Chronic  back pain/Pain Management: Used hydrocodone tid PTA due to back and LE pain. [facility]  -scheduled hydrocodone to help with activity tolerance  4. Mood: LCSW to follow for evaluation and support.  5. Neuropsych: This patient is not fully capable of making decisions on her own behalf. 6. Skin/Wound Care: Routine pressure relief measures. Maintain adequate nutrition and hyrdration status.  7. Fluids/Electrolytes/Nutrition: Continue Dysphagia 1, honey liquids as swallow function shows minimal improvement. IVF at night and push fluids during the day.   8. Acute on chronic renal failure: Cr now down to 1.08--continue on IVF over the weekend 9. HTN: Monitor tid for now.  10. Acute on chronic Iron deficiency anemia: on iron supplement. No evidence for acute or ongoing bleeding 11. Aspiration PNA/H flu PNA: Has completed antibiotic course.  12. 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 Continue amiodarone daily and lopressor bid. To follow up with [facility] after discharge.  13. Rectal bleeding: Has resolved--monitor for recurrence. Will need follow up evaluation after discharge.  14. Depression: Used to take Celexa and Abilify PTA with good results per family.  15. Urinary retention: Foley replaced due to poor UOP---continue for now. 16. S/p doxy course for Cellulitis right forearm: continue dry dressing-area looks like hematoma   Length of stay, days: 4   Valerie A. Felicity CoyerLeschber, MD 08/26/2015, 9:16 AM

## 2015-08-26 NOTE — Progress Notes (Signed)
Physical Therapy Session Note  Patient Details  Name: Lisa [name] MRN: [id number] Date of Birth: 03/25/1927  Today's Date: 08/26/2015 PT Individual Time: 0900-1000 PT Individual Time Calculation (min): 60 min   Short Term Goals: Week 1:  PT Short Term Goal 1 (Week 1): Patient will perform bed mobility with min A. PT Short Term Goal 2 (Week 1): Patient will transfer wheelchair <> bed with mod A.  PT Short Term Goal 3 (Week 1): Patient will initiate ambulation.  PT Short Term Goal 4 (Week 1): Patient will maintain standing balance x 2 min with assist of one person.   Skilled Therapeutic Interventions/Progress Updates:  Pt no longer on bedrest able to participate with therapy as tolerated. Pt transferred supine to edge of bed with head of bed elevated, side rail and mod A with verbal cues. Pt transferred edge of bed to w/c with max A and verbal cues. Pt propelled w/c about 125 feet with B UE and LEs, requiring S and verbal cues. In gym worked in parallel bars. Pt stood multiple times in parallel bars with mod A and verbal cues. While standing worked on weight shifting, unilateral stance and upright posture. Pt was able to tolerate standing for about 60 seconds with min A and verbal cues. In parallel bars, pt ambulated 2, 5 and 7 feet forward and backward with max A and verbal cues. Pt propelled w/c about 75 feet with B UE and LEs, requiring S and verbal cues. Pt returned to room and left sitting up with son at bedside. Pt's requires max encouragement secondary to fear and weakness but did well with therapy.  Therapy Documentation Precautions:  Precautions Precautions: Fall Restrictions Weight Bearing Restrictions: No General:   Pain: Pt c/o mild pain B feet.   See Function Navigator for Current Functional Status.   Therapy/Group: Individual Therapy  Rayford HalstedMitchell, Luisantonio Adinolfi G 08/26/2015, 12:40 PM

## 2015-08-27 ENCOUNTER — Inpatient Hospital Stay (HOSPITAL_COMMUNITY): Payer: Medicare Other | Admitting: Speech Pathology

## 2015-08-27 ENCOUNTER — Inpatient Hospital Stay (HOSPITAL_COMMUNITY): Payer: Medicare Other | Admitting: *Deleted

## 2015-08-27 ENCOUNTER — Encounter: Payer: Medicare Other | Admitting: Pain Medicine

## 2015-08-27 ENCOUNTER — Inpatient Hospital Stay (HOSPITAL_COMMUNITY): Payer: Medicare Other

## 2015-08-27 DIAGNOSIS — I82409 Acute embolism and thrombosis of unspecified deep veins of unspecified lower extremity: Secondary | ICD-10-CM | POA: Diagnosis present

## 2015-08-27 DIAGNOSIS — A499 Bacterial infection, unspecified: Secondary | ICD-10-CM

## 2015-08-27 DIAGNOSIS — N390 Urinary tract infection, site not specified: Secondary | ICD-10-CM

## 2015-08-27 DIAGNOSIS — S069X4S Unspecified intracranial injury with loss of consciousness of 6 hours to 24 hours, sequela: Secondary | ICD-10-CM

## 2015-08-27 DIAGNOSIS — I82403 Acute embolism and thrombosis of unspecified deep veins of lower extremity, bilateral: Secondary | ICD-10-CM

## 2015-08-27 LAB — CBC
HEMATOCRIT: 33.4 % — AB (ref 36.0–46.0)
HEMOGLOBIN: 10.6 g/dL — AB (ref 12.0–15.0)
MCH: 30.5 pg (ref 26.0–34.0)
MCHC: 31.7 g/dL (ref 30.0–36.0)
MCV: 96.3 fL (ref 78.0–100.0)
Platelets: 285 10*3/uL (ref 150–400)
RBC: 3.47 MIL/uL — ABNORMAL LOW (ref 3.87–5.11)
RDW: 17 % — ABNORMAL HIGH (ref 11.5–15.5)
WBC: 7.1 10*3/uL (ref 4.0–10.5)

## 2015-08-27 LAB — BASIC METABOLIC PANEL
ANION GAP: 8 (ref 5–15)
BUN: 11 mg/dL (ref 6–20)
CALCIUM: 8.7 mg/dL — AB (ref 8.9–10.3)
CO2: 27 mmol/L (ref 22–32)
Chloride: 101 mmol/L (ref 101–111)
Creatinine, Ser: 1.22 mg/dL — ABNORMAL HIGH (ref 0.44–1.00)
GFR calc Af Amer: 45 mL/min — ABNORMAL LOW (ref >60)
GFR calc non Af Amer: 39 mL/min — ABNORMAL LOW (ref >60)
GLUCOSE: 97 mg/dL (ref 65–99)
Potassium: 4.6 mmol/L (ref 3.5–5.1)
Sodium: 136 mmol/L (ref 135–145)

## 2015-08-27 LAB — GLUCOSE, CAPILLARY
GLUCOSE-CAPILLARY: 98 mg/dL (ref 65–99)
Glucose-Capillary: 92 mg/dL (ref 65–99)
Glucose-Capillary: 106 mg/dL — ABNORMAL HIGH (ref 65–99)
Glucose-Capillary: 98 mg/dL (ref 65–99)

## 2015-08-27 MED ORDER — CEPHALEXIN 250 MG PO CAPS
250.0000 mg | ORAL_CAPSULE | Freq: Three times a day (TID) | ORAL | Status: AC
  Administered 2015-08-27 – 2015-09-02 (×21): 250 mg via ORAL
  Filled 2015-08-27 (×21): qty 1

## 2015-08-27 MED ORDER — PROCHLORPERAZINE MALEATE 5 MG PO TABS
5.0000 mg | ORAL_TABLET | Freq: Four times a day (QID) | ORAL | Status: DC | PRN
  Administered 2015-09-04: 5 mg via ORAL
  Filled 2015-08-27: qty 1

## 2015-08-27 MED ORDER — PROCHLORPERAZINE EDISYLATE 5 MG/ML IJ SOLN: 10.0000 mg | Freq: Four times a day (QID) | INTRAMUSCULAR | Status: DC | PRN

## 2015-08-27 MED ORDER — RANITIDINE HCL 150 MG/10ML PO SYRP
150.0000 mg | ORAL_SOLUTION | Freq: Two times a day (BID) | ORAL | Status: DC
  Administered 2015-08-27 – 2015-08-29 (×5): 150 mg via ORAL
  Filled 2015-08-27 (×7): qty 10

## 2015-08-27 NOTE — Plan of Care (Signed)
Problem: RH KNOWLEDGE DEFICIT BRAIN INJURY Goal: RH STG INCREASE KNOWLEDGE OF DYSPHAGIA/FLUID INTAKE Outcome: Not Progressing Patient requires max assist.

## 2015-08-27 NOTE — Progress Notes (Signed)
PHYSICAL MEDICINE & REHABILITATION     PROGRESS NOTE    Subjective/Complaints: RN reports more confusion and restlessness over weekend. Patient comfortable in bed this am. .   ROS limited due to cognitive/communication deficits  Objective: Vital Signs: Blood pressure 127/62, pulse 90, temperature 98.3 F (36.8 C), temperature source Oral, resp. rate 18, height  (1.6 m), weight 65.9 kg (145 lb 4.5 oz), SpO2 96 %. No results found.  Recent Labs  08/27/15 0650  WBC 7.1  HGB 10.6*  HCT 33.4*  PLT 285    Recent Labs  08/27/15 0650  NA 136  K 4.6  CL 101  GLUCOSE 97  BUN 11  CREATININE 1.22*  CALCIUM 8.7*   CBG (last 3)   Recent Labs  08/26/15 1623 08/26/15 2036 08/27/15 0702  GLUCAP 84 115* 92    Wt Readings from Last 3 Encounters:  08/27/15 65.9 kg (145 lb 4.5 oz)  08/22/15 67 kg (147 lb 11.3 oz)  08/03/15 65.772 kg (145 lb)    Physical Exam:  Constitutional: She is oriented to person, place, and time. She appears well-developed and well-nourished.     HENT:  Head: Normocephalic and atraumatic.  Right Ear: No decreased hearing is noted.  Left Ear: No decreased hearing is noted.  Mouth/Throat: Mucous membranes still dry. Debris on teeth as well..  Ecchymosis left forehead nearly resolved Eyes: Conjunctivae are normal. Pupils are equal, round, and reactive to light.   Neck: Normal range of motion. Neck supple.  Cardiovascular: Normal rate and regular rhythm.  Respiratory: Effort normal and breath sounds normal. No respiratory distress. She has no wheezes.  GI: Soft. Bowel sounds are normal. She exhibits no distension. There is no tenderness. There is no rebound.  Musculoskeletal: low back tender with truncal movement and palpation  Neurological: She is alert and oriented to person, place, and time. Left facial weakness.    Recalls me from Friday. Doesn't initaite much.  Skin: Skin is warm and dry.  Multiple scabbed areas  bilateral feet. Gluteal cleft with dry flaky skin.  Motor 4/5 UE's. LE's 2-3/5 at hips/knees partly due to pain. 4/5 ankles     Sensation intact to light touch in bilateral upper and lower limbs Skin: indurated area right forearm with dry dressing  Assessment/Plan: 1. Weakness, balance and cognitive deficits secondary to traumatic brain injury which require 3  hours per day of interdisciplinary therapy in a comprehensive inpatient rehab setting. (reduced to allow for fatigue,poor activity tolerance thus far) Physiatrist is providing close team supervision and 24 hour management of active medical problems listed below. Physiatrist and rehab team continue to assess barriers to discharge/monitor patient progress toward functional and medical goals.  Function:  Bathing Bathing position   Position: Bed  Bathing parts Body parts bathed by patient: Right arm, Left arm, Chest, Abdomen, Front perineal area Body parts bathed by helper: Buttocks, Right upper leg, Left upper leg, Right lower leg, Left lower leg, Back  Bathing assist Assist Level: Touching or steadying assistance(Pt > 75%)      Upper Body Dressing/Undressing Upper body dressing   What is the patient wearing?: Pull over shirt/dress     Pull over shirt/dress - Perfomed by patient: Thread/unthread right sleeve, Thread/unthread left sleeve, Put head through opening Pull over shirt/dress - Perfomed by helper: Pull shirt over trunk        Upper body assist Assist Level: Touching or steadying assistance(Pt > 75%)   Set up : To obtain clothing/put  away  Lower Body Dressing/Undressing Lower body dressing   What is the patient wearing?: Underwear, Pants   Underwear - Performed by helper: Thread/unthread right underwear leg, Thread/unthread left underwear leg, Pull underwear up/down   Pants- Performed by helper: Thread/unthread right pants leg, Thread/unthread left pants leg, Pull pants up/down, Fasten/unfasten pants   Non-skid  slipper socks- Performed by helper: Don/doff right sock, Don/doff left sock                  Lower body assist Assist for lower body dressing: Touching or steadying assistance (Pt > 75%)      Toileting Toileting Toileting activity did not occur: No continent bowel/bladder event   Toileting steps completed by helper: Adjust clothing prior to toileting, Performs perineal hygiene, Adjust clothing after toileting Toileting Assistive Devices: Grab bar or rail  Toileting assist Assist level: Two helpers   Transfers Chair/bed transfer   Chair/bed transfer method: Stand pivot Chair/bed transfer assist level: Maximal assist (Pt 25 - 49%/lift and lower) Chair/bed transfer assistive device: Armrests     Locomotion Ambulation Ambulation activity did not occur: Safety/medical concerns   Max distance: 7 Assist level: Maximal assist (Pt 25 - 49%)   Wheelchair   Type: Manual Max wheelchair distance: 125 Assist Level: Supervision or verbal cues  Cognition Comprehension Comprehension assist level: Understands basic 50 - 74% of the time/ requires cueing 25 - 49% of the time  Expression Expression assist level: Expresses basic 50 - 74% of the time/requires cueing 25 - 49% of the time. Needs to repeat parts of sentences.  Social Interaction Social Interaction assist level: Interacts appropriately 50 - 74% of the time - May be physically or verbally inappropriate.  Problem Solving Problem solving assist level: Solves basic 25 - 49% of the time - needs direction more than half the time to initiate, plan or complete simple activities  Memory Memory assist level: Recognizes or recalls 50 - 74% of the time/requires cueing 25 - 49% of the time   Medical Problem List and Plan: 1. 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 DVT  Prophylaxis/Anticoagulation: bilateral ?distal femoral DVT's---eliquis  -re-doppler Friday 3. Chronic back pain/Pain Management: Used hydrocodone tid due to back and LE pain. [facility]   -scheduled hydrocodone to help with activity tolerance     4. Mood: LCSW to follow for evaluation and support.  5. Neuropsych: This patient is not fully capable of making decisions on her own behalf.  -suspect some of confusion is related to urosepsis  -I reviewed personally all labs which were within acceptable range today 6. Skin/Wound Care: Routine pressure relief measures. Maintain adequate nutrition and hyrdration status.  7. Fluids/Electrolytes/Nutrition: Continue Dysphagia 1, honey liquids as swallow function shows minimal improvement. IVF at night and push fluids during the day.   8. Acute on chronic renal failure: Cr at 1.22--continue IVF for now 9. HTN: Monitor tid for now.  10. Acute on chronic Iron deficiency anemia: Added iron supplement. hgb 8.9 today,  i would assume some of this is dilutional in addtion to #13 11. Aspiration PNA/H flu PNA: Has completed antibiotic course.  12. 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 Continue amiodarone daily and lopressor bid. To follow up with [facility] after discharge.  13. Rectal bleeding: Has resolved--without  recurrence. Will need follow up evaluation after discharge.  14. Depression: Used  to take Celexa and Abilify PTA with good results per family.  15. Urinary retention?: Foley replaced due to poor UOP---continue for now.  -100k E Coli---begin keflex  tid x 7 days 16. Cellulitis right forearm: doxycycline and dry dressing---will dc after today's doses---area looks like hematoma   LOS (Days) 5 A FACE TO FACE EVALUATION WAS PERFORMED  Brynlie Daza T 08/27/2015 8:54 AM

## 2015-08-27 NOTE — Progress Notes (Signed)
Social Work Assessment and Plan Social Work Assessment and Plan  Patient Details  Name: Lisa SnellenFaye K Zelenak MRN: [id number] Date of Birth: 07/27/1927  Today's Date: 08/23/2015  Problem List:  Patient Active Problem List   Diagnosis Date Noted  . Bacterial UTI 08/27/2015  . DVT, lower extremity (HCC) 08/27/2015  . TBI (traumatic brain injury) (HCC) 08/22/2015  . ICH (intracerebral hemorrhage) (HCC)   . Abscess   . Hypomagnesemia   . SDH (subdural hematoma) (HCC)   . Boil of upper extremity   . Pneumonia due to Haemophilus influenzae (HCC)   . Chronic systolic CHF (congestive heart failure) (HCC)   . Acute on chronic renal failure (HCC)   . Hypokalemia   . Bright red blood per rectum   . Dysphagia   . Pressure ulcer 08/16/2015  . Acute respiratory failure with hypoxemia (HCC)   . Hypernatremia   . Subdural hematoma (HCC) 08/03/2015  . Lumbar radiculopathy 06/06/2015  . Spinal stenosis, lumbar region, with neurogenic claudication 04/02/2015  . Degenerative lumbar disc 02/15/2015  . Lumbar post-laminectomy syndrome 02/15/2015  . Facet syndrome, lumbar 02/15/2015  . Sacroiliac joint dysfunction 02/15/2015   Past Medical History:  Past Medical History  Diagnosis Date  . Allergy   . Pelvis fracture (HCC)   . Wrist fracture, right   . Depression   . Spinal stenosis   . Osteoarthritis   . Hypertension   . Foot fracture     bilateral   Past Surgical History:  Past Surgical History  Procedure Laterality Date  . Back surgery N/A   . Breast lumpectomy N/A    Social History:  reports that she has never smoked. She has never used smokeless tobacco. She reports that she does not drink alcohol or use illicit drugs.  Family / Support Systems Marital Status: Widow/Widower How Long?: 20 yrs Patient Roles: Parent Children: son (only child), Mendel Ryderddie Suares [place], [place]) @ [phone] Other Supports: friend, Clerance LavSusie Stone @ [phone] and friend/ Jordan Hawksneighbor, Barbara Loyd @  [phone] Anticipated Caregiver: Family, friends and hired help as needed Ability/Limitations of Caregiver: Son lives out of state.  Has friends and family who can come into town to assist.  Son says can hire help if needed Caregiver Availability: Other (Comment) (son aware 24/7 will need to be arranged) Family Dynamics: [place] and very supportive and concerned.  He plans to return home briefly next week but available via phone and will coordinate care.  Social History Preferred language: English Religion: Baptist Cultural Background: NA Education: HS Read: Yes Write: Yes Employment Status: Retired Fish farm managerLegal Hisotry/Current Legal Issues: None  Guardian/Conservator: None - per MD, pt not yet fully capable of making decisions on her own behalf - defer to son.   Abuse/Neglect Physical Abuse: Denies Verbal Abuse: Denies Sexual Abuse: Denies Exploitation of patient/patient's resources: Denies Self-Neglect: Denies Possible [facility] reported [facility]:: [facility]  Emotional Status Pt's affect, behavior adn adjustment status: Pt appears very fatigued but does make attempts to complete assessment as she is able.  Son does assist with information.  Pt denies any s/s of depression, however, son notes h/o depression for pt and is monitoring currently.  Will defer formal screen until pt more alert - referring to neuropsychology. Recent Psychosocial Issues: None Pyschiatric History: Per son, pt with h/o for depression which was managed by primary MD/ meds. Substance Abuse History: None  Patient / Family Perceptions, Expectations & Goals Pt/Family understanding of illness & functional limitations: Pt with very  basic understanding of her TBI and resulting limitations.  Son with good understanding of her SDH, functional limitations and anticipated d/c care needs. Premorbid pt/family roles/activities: Completely independent and still driving. Anticipated changes in  roles/activities/participation: Pt will need 24/7 assistance - family, private duty, friends to assume support roles. Pt/family expectations/goals: "I just want to get home."  [employer]: None Premorbid Home Care/DME Agencies: None Transportation available at discharge: yes Resource referrals recommended: Neuropsychology, Support group (specify)  Discharge Planning Living Arrangements: Alone Support Systems: Children, Manufacturing engineer, Other relatives Type of Residence: Private residence Insurance Resources: [employer], Media planner (specify) Herbalist;  also has Database administrator) Architect: Restaurant manager, fast food Screen Referred: No Living Expenses: Own Money Management: Patient (and has a Firefighter) Does the patient have any problems obtaining your medications?: No Home Management: pt Patient/Family Preliminary Plans: Son reports plan is for pt to d/c to her home and he will be arranging 24/7 assist. Social Work Anticipated Follow Up Needs: HH/OP  Clinical Impression Unfortunate, elderly woman here following a fall and suffering a TBI.  Son from [place] here at bedside and assists with completion of assessment interview.  Pt lethargic but does attempt to answer questions.  She denies any s/s of any emotional distress - will monitor.  Son reports he will be coordinating care for pt post d/c.  Will follow for support and d/c planning needs.  Parilee Hally 08/23/2015, 1:22 PM

## 2015-08-27 NOTE — Plan of Care (Signed)
Problem: RH BLADDER ELIMINATION Goal: RH STG MANAGE BLADDER WITH EQUIPMENT WITH ASSISTANCE STG Manage Bladder With Equipment With mod Assistance   Outcome: Not Progressing Total assist    

## 2015-08-27 NOTE — Progress Notes (Signed)
ANTICOAGULATION CONSULT NOTE - Follow Up Consult  Pharmacy Consult for Apixaban Indication: DVT  Allergies  Allergen Reactions  . Ambien [Zolpidem Tartrate] Other (See Comments)    Reaction:  Hallucinations   . Daypro [Oxaprozin] Other (See Comments)    GI upset  . Oxycodone Nausea And Vomiting  . Shellfish Allergy Swelling and Other (See Comments)    Pts mouth and face swells.   . Ace Inhibitors Cough  . Norvasc [Amlodipine Besylate] Palpitations    Patient Measurements: Height: 5\' 3"  (160 cm) Weight: 145 lb 4.5 oz (65.9 kg) IBW/kg (Calculated) : 52.4 Heparin Dosing Weight:   Vital Signs: Temp: 98.3 F (36.8 C) (12/05 0554) Temp Source: Oral (12/05 0554) BP: 138/70 mmHg (12/05 0857) Pulse Rate: 92 (12/05 0857)  Labs:  Recent Labs  08/27/15 0650  HGB 10.6*  HCT 33.4*  PLT 285  CREATININE 1.22*    Estimated Creatinine Clearance: 30.2 mL/min (by C-G formula based on Cr of 1.22).   Medications:  Scheduled:  . amiodarone  100 mg Oral Daily  . apixaban  5 mg Oral BID  . calcium-vitamin D  1 tablet Oral TID WC  . cephALEXin  250 mg Oral 3 times per day  . citalopram  20 mg Oral Daily  . HYDROcodone-acetaminophen  1 tablet Oral BID WC  . insulin aspart  0-9 Units Subcutaneous TID WC  . metoprolol tartrate  12.5 mg Oral BID WC  . multivitamin-lutein  1 capsule Oral BID  . pantoprazole  40 mg Oral Daily  . ranitidine  150 mg Oral BID  . RESOURCE THICKENUP CLEAR   Oral Once    Assessment: 81yo female admitted mid-November with a subarachnoid hemorrhage and subdural hematoma due to fall. She also had rectal bleeding in late November, now resolved, and currently has a hematoma on her forearm. She was admitted to Rehab on 12/2 and dopplers were (+)RLE DVT with possible B-femoral vein DVTs. She is on Apixaban per pharmacy; 5mg  po BID was ordered by Rehab team; this had also been okayed by Dr. Franky Machoabbell.  No bleeding has been noted.    12/5:  Hg 10.6- improved, pltc  wnl  Goal of Therapy:  treatment of VTE Monitor platelets by anticoagulation protocol: Yes   Plan:  Continue Apixaban 5mg  po BID Watch for s/s of bleeding F/U doppler planned for Friday  Marisue HumbleKendra Mylissa Lambe, PharmD Clinical Pharmacist [facility]

## 2015-08-27 NOTE — Progress Notes (Signed)
Son expressing concerns about patient's mood and sleepiness. Discussed affects of BI compounded by age and UTI. Will and relayed that we will hold off on Ability due to concerns of side effects and celexa has been on board since admission. She is still not sleeping well at nights per reports.

## 2015-08-27 NOTE — Progress Notes (Signed)
Physical Therapy Session Note  Patient Details  Name: Lisa [name] MRN: [id number] Date of Birth: 04/09/1927  Today's Date: 08/27/2015 PT Individual Time: 1400-1530 PT Individual Time Calculation (min): 90 min   Short Term Goals: Week 1:  PT Short Term Goal 1 (Week 1): Patient will perform bed mobility with min A. PT Short Term Goal 2 (Week 1): Patient will transfer wheelchair <> bed with mod A.  PT Short Term Goal 3 (Week 1): Patient will initiate ambulation.  PT Short Term Goal 4 (Week 1): Patient will maintain standing balance x 2 min with assist of one person.   Skilled Therapeutic Interventions/Progress Updates:    Patient in recliner in room transferred to w/c with +2 mod A and attempted sit to stand at sink to brush hair, but pt stood briefly then sat stating she could not stand.  Reaching forward with max cues to retrieve comb and comb hair sitting in w/c.  Patient propelled chair x 120' S with max cues for direction and attn to task.  Patient transferred to stand mod A and ambulated 6' to turn and sit in another chair x 4 reps.  Patient needed assist for pelvic weight shift over BOS and for weight shift and cues for step length, assist for turning walker.  Patient transferred to mat via squat pivot mod A and cues.  Sitting balance min A initially, then pt requested to lie down needing +2 A for sit to supine on mat.  Performed therex consisting of SLR, bridging with A, lateral trunk rolls, double knee lifts, and clamshell hip abduction with yellow t-band all with max cues for attention and task completion.  Patient seated in w/c performed 2 x 10 bilateral horizontal abduction with towel roll at back for strengthening postural muscles.  Patient supine to side to sit increased time, max A.  Seated with bilateral UE's on ball rollouts on red ball for anterior weight shift.  Patient transferred to w/c stand pivot with RW and +2 A.  Assisted in chair back to room and transferred to recliner mod A  squat pivot.  Unable to comply with safety plan pt in recliner due to no quick release so assisted pt in bed via squat pivot mod A and sit to supine mod A.  Left in bed with call bell and bed alarm.  Therapy Documentation Precautions:  Precautions Precautions: Fall Restrictions Weight Bearing Restrictions: No Vital Signs:  HR 120 bpm after ambulation, down to 95 with seated rest x 2 min Pain: Pain Assessment Faces Pain Scale: No hurt      See Function Navigator for Current Functional Status.   Therapy/Group: Individual Therapy  Rudi CocoWYNN,CYNDI  Cyndi MillhousenWynn, [place]PT [phone] 08/27/2015  08/27/2015, 4:04 PM

## 2015-08-27 NOTE — Progress Notes (Signed)
Occupational Therapy Session Note  Patient Details  Name: Lisa [name] MRN: [id number] Date of Birth: 11/08/1926  Today's Date: 08/27/2015 OT Individual Time: 1100-1200 OT Individual Time Calculation (min): 60 min    Short Term Goals: Week 1:  OT Short Term Goal 1 (Week 1): Pt will be engaged in 10 minutes of functional self care tasks with requiring less than 1 rest break . OT Short Term Goal 2 (Week 1): Pt will perform LB dressing with max A in order to decrease level of assist needed with self care. OT Short Term Goal 3 (Week 1): Pt will perform UB dressing with min A in order to decrease level of assist needed with self care. OT Short Term Goal 4 (Week 1): Pt will perform toilet transfer with mod A in order to increase I with functional transfers.  Skilled Therapeutic Interventions/Progress Updates:    Pt resting in recliner upon arrival with son present.  Pt's son left room during BADLs.  Pt initially reluctant to engage in bathing and dressing tasks, stating that she had already completed tasks.  Pt's son reminded pt that she still needed to complete tasks.  Pt required max A for SPT to w/c and completed bathing and dressing tasks with sit<>stand from w/c at sink.  Pt required mod A for sit<>stand and min A for standing balance at sink.  Pt able to stand from aprrox 30 seconds before sitting down.  Pt performed sit<>stand X 4 to complete LB bathing and dressing tasks.  Pt completed grooming tasks at sink with mod verbal cues to "rinse and spit" when brushing teeth.  Pt required more than a reasonable amount of time to complete tasks with multiple rest breaks.  Pt engaged in BUE therex with 3# weight bar while seated in recliner.  Focus on activity tolerance, functional transfers, sit<>stand, standing balance, and safety awareness to increase independence with BADLs.  Therapy Documentation Precautions:  Precautions Precautions: Fall Restrictions Weight Bearing Restrictions: No Pain: Pain  Assessment Pain Type: Chronic pain Pain Location: Back Pain Orientation: Lower Pain Descriptors / Indicators: Aching Pain Onset: On-going Patients Stated Pain Goal: 2 Pain Intervention(s): Repositioned;RN made aware;Distraction Multiple Pain Sites: No  See Function Navigator for Current Functional Status.   Therapy/Group: Individual Therapy  Rich BraveLanier, Rilyn Scroggs Chappell 08/27/2015, 12:06 PM

## 2015-08-27 NOTE — Care Management Note (Signed)
[facility] Individual Statement of Services  Patient Name:  Lisa [name]  Date:  08/23/2015  Welcome to the [facility]patient [facility].  Our goal is to provide you with an individualized program based on your diagnosis and situation, designed to meet your specific needs.  With this comprehensive rehabilitation program, you will be expected to participate in at least 3 hours of rehabilitation therapies Monday-Friday, with modified therapy programming on the weekends.  Your rehabilitation program will include the following services:  Physical Therapy (PT), Occupational Therapy (OT), Speech Therapy (ST), 24 hour per day rehabilitation nursing, Therapeutic Recreaction (TR), Neuropsychology, Case Management (Social Worker), Rehabilitation Medicine, Nutrition Services and Pharmacy Services  Weekly team conferences will be held on Tuesdays to discuss your progress.  Your Social Worker will talk with you frequently to get your input and to update you on team discussions.  Team conferences with you and your family in attendance may also be held.  Expected length of stay: 21-24 days  Overall anticipated outcome: minimal assistance  Depending on your progress and recovery, your program may change. Your Social Worker will coordinate services and will keep you informed of any changes. Your Social Worker's name and contact numbers are listed  below.  The following services may also be recommended but are not provided by the [facility]:   Driving Evaluations  Home Health Rehabiltiation Services  Outpatient Rehabilitation Services    Arrangements will be made to provide these services after discharge if needed.  Arrangements include referral to agencies that provide these services.  Your insurance has been verified to be:  Medicare and BCBS Your primary doctor is:  Dr. Larwance SachsBabaoff  Pertinent information will be shared with your doctor and your insurance  company.  Social Worker:  DawsonLucy Glessie Eustice, [place] [phone] or [phone]   Information discussed with and copy given to patient by: Amada JupiterHOYLE, Shantice Menger, 08/27/2015, 1:26 PM

## 2015-08-27 NOTE — Progress Notes (Signed)
Speech Language Pathology Daily Session Note  Patient Details  Name: Lisa [name] MRN: [id number] Date of Birth: 08/11/1927  Today's Date: 08/27/2015 SLP Individual Time: [id number] SLP Individual Time Calculation (min): 55 min  Short Term Goals: Week 1: SLP Short Term Goal 1 (Week 1): Patient will consume current diet with minimal overt s/s of aspiration with Min A verbal cues for use of swallow strategies.  SLP Short Term Goal 2 (Week 1): Patient will consume trials of nectar-thick liqiuds with minimal overt s/s of aspiration with Min A verbal cues over 3 consecutive sessions to assess readiness for repeat MBS SLP Short Term Goal 3 (Week 1): Patient will sustain attention to a functional task for 5 minutes with Mod A verbal cues for redirection.  SLP Short Term Goal 4 (Week 1): Patient will identify 2 physical and 2 cognitive deficits with Mod A question and semantic cues.  SLP Short Term Goal 5 (Week 1): Patient will initiate functional tasks with no more than 1 visual and 1 verbal cue in 75% of opportunities.  SLP Short Term Goal 6 (Week 1): Patient will demonstrate functional problem solving for basic and familiar tasks with Mod A verbal cues.   Skilled Therapeutic Interventions: Skilled treatment session focused on dysphagia and cognitive goals. Upon arrival, patient was sitting upright in bed and appeared fatigued. SLP facilitated session by providing Min A verbal cues for initiation of self-feeding with breakfast meal of Dys. 1 textures with honey-thick liquids. Patient consumed meal without overt s/s of aspiration and required Min A verbal cues for swallow initiation due to mild oral holding. Patient with minimal PO intake due to reports of decreased appetite, back pain and nausea. RN made aware. Patient required frequent repositioning due to pain and Max encouragement for PO intake and sustained attention to self-feeding for ~60 second intervals with Max A verbal cues for redirection.   Patient's son present and also provided encouragement and asking questions in regards to if fatigue is possibly due to anxiety and depression, PA made aware.  Patient also appeared more confused today with patient's son also reporting he felt the patient was more confused today as well. Patient left supine in bed with PA and RN present. Continue with current plan of care.    Function:  Eating Eating   Modified Consistency Diet: Yes Eating Assist Level: More than reasonable amount of time;Set up assist for;Supervision or verbal cues   Eating Set Up Assist For: Opening containers       Cognition Comprehension Comprehension assist level: Understands basic 50 - 74% of the time/ requires cueing 25 - 49% of the time  Expression   Expression assist level: Expresses basic 50 - 74% of the time/requires cueing 25 - 49% of the time. Needs to repeat parts of sentences.  Social Interaction Social Interaction assist level: Interacts appropriately 25 - 49% of time - Needs frequent redirection.  Problem Solving Problem solving assist level: Solves basic 25 - 49% of the time - needs direction more than half the time to initiate, plan or complete simple activities  Memory Memory assist level: Recognizes or recalls 25 - 49% of the time/requires cueing 50 - 75% of the time    Pain Pain Assessment Pain Type: Chronic pain Pain Location: Back Pain Orientation: Lower Pain Descriptors / Indicators: Aching Pain Onset: On-going Patients Stated Pain Goal: 2 Pain Intervention(s): Medication (See eMAR);Repositioned;RN made aware;Distraction Multiple Pain Sites: No  Therapy/Group: Individual Therapy  Cyrus Ramsburg 08/27/2015, 9:18 AM

## 2015-08-28 ENCOUNTER — Inpatient Hospital Stay (HOSPITAL_COMMUNITY): Payer: Medicare Other | Admitting: *Deleted

## 2015-08-28 ENCOUNTER — Inpatient Hospital Stay (HOSPITAL_COMMUNITY): Payer: Medicare Other | Admitting: Occupational Therapy

## 2015-08-28 ENCOUNTER — Inpatient Hospital Stay (HOSPITAL_COMMUNITY): Payer: Medicare Other | Admitting: Speech Pathology

## 2015-08-28 ENCOUNTER — Telehealth: Payer: Self-pay | Admitting: *Deleted

## 2015-08-28 DIAGNOSIS — I5022 Chronic systolic (congestive) heart failure: Secondary | ICD-10-CM

## 2015-08-28 LAB — GLUCOSE, CAPILLARY
GLUCOSE-CAPILLARY: 88 mg/dL (ref 65–99)
GLUCOSE-CAPILLARY: 83 mg/dL (ref 65–99)
Glucose-Capillary: 102 mg/dL — ABNORMAL HIGH (ref 65–99)
Glucose-Capillary: 171 mg/dL — ABNORMAL HIGH (ref 65–99)

## 2015-08-28 MED ORDER — SODIUM CHLORIDE 0.9 % IV SOLN
INTRAVENOUS | Status: DC
  Administered 2015-08-28: 1000 mL via INTRAVENOUS
  Administered 2015-08-29: 18:00:00 via INTRAVENOUS

## 2015-08-28 MED ORDER — SODIUM CHLORIDE 0.9 % IV SOLN: INTRAVENOUS | Status: DC

## 2015-08-28 NOTE — Progress Notes (Signed)
Occupational Therapy Session Note  Patient Details  Name: Lisa [name] MRN: [id number] Date of Birth: 12/08/1926  Today's Date: 08/28/2015 OT Individual Time: 1100-1130 OT Individual Time Calculation (min): 30 min    Short Term Goals: Week 1:  OT Short Term Goal 1 (Week 1): Pt will be engaged in 10 minutes of functional self care tasks with requiring less than 1 rest break . OT Short Term Goal 2 (Week 1): Pt will perform LB dressing with max A in order to decrease level of assist needed with self care. OT Short Term Goal 3 (Week 1): Pt will perform UB dressing with min A in order to decrease level of assist needed with self care. OT Short Term Goal 4 (Week 1): Pt will perform toilet transfer with mod A in order to increase I with functional transfers.      Skilled Therapeutic Interventions/Progress Updates:    1:1 skilled OT to address functional mobility, sit to stand, activity tolerance. Pt received in recliner and agreeable to working on exercises.  Pt worked on [employer] Storesp[employer]unctional skills to develop sit to stand. Pt worked on reaching with BUE forward for forward lean and then progressed to pushing on B arm rests to and then to active arm extension with hip lift. Pt became dizzy with forward wt shifts and asked to rest. During this time, worked on orientation as pt was confusing some current events.  She repeated partial sit to stand a few times, but then stated she was dizzy again. No nystagmus noted.  Pt resting in recliner with quick release belt and call light in reach.  Therapy Documentation Precautions:  Precautions Precautions: Fall Restrictions Weight Bearing Restrictions: No      Pain: Pain Assessment Pain Assessment: No/denies pain ADL:   See Function Navigator for Current Functional Status.   Therapy/Group: Individual Therapy  Li Bobo 08/28/2015, 12:18 PM

## 2015-08-28 NOTE — Discharge Instructions (Signed)
Inpatient Rehab Discharge Instructions  Lisa [name] Discharge date and time:    Activities/Precautions/ Functional Status: Activity: no lifting, driving, or strenuous exercise for till cleared by MD Diet: regular diet Wound Care:   Functional status:  ___ No restrictions     ___ Walk up steps independently _X__ 24/7 supervision/assistance   ___ Walk up steps with assistance ___ Intermittent supervision/assistance  ___ Bathe/dress independently ___ Walk with walker     _X__ Bathe/dress with assistance ___ Walk Independently    ___ Shower independently _X__ Walk with assistance    ___ Shower with assistance _X__ No alcohol     ___ Return to work/school ________  Special Instructions:    My questions have been answered and I understand these instructions. I will adhere to these goals and the provided educational materials after my discharge from the hospital.  Patient/Caregiver Signature _______________________________ Date __________  Clinician Signature _______________________________________ Date __________  Please bring this form and your medication list with you to all your follow-up doctor's appointments.     Information on my medicine - ELIQUIS (apixaban)  This medication education was reviewed with me or my healthcare representative as part of my discharge preparation.  The pharmacist that spoke with me during my hospital stay was:  Lisa [name], [name], [facility]  Why was Eliquis prescribed for you? Eliquis was prescribed to treat blood clots that may have been found in the veins of your legs (deep vein thrombosis) or in your lungs (pulmonary embolism) and to reduce the risk of them occurring again.  What do You need to know about Eliquis ? The dose is ONE 5 mg tablet taken TWICE daily.  Eliquis may be taken with or without food.   Try to take the dose about the same time in the morning and in the evening. If you have difficulty swallowing the tablet whole please discuss  with your pharmacist how to take the medication safely.  Take Eliquis exactly as prescribed and DO NOT stop taking Eliquis without talking to the doctor who prescribed the medication.  Stopping may increase your risk of developing a new blood clot.  Refill your prescription before you run out.  After discharge, you should have regular check-up appointments with your healthcare provider that is prescribing your Eliquis.    What do you do if you miss a dose? If a dose of ELIQUIS is not taken at the scheduled time, take it as soon as possible on the same day and twice-daily administration should be resumed. The dose should not be doubled to make up for a missed dose.  Important Safety Information A possible side effect of Eliquis is bleeding. You should call your healthcare provider right away if you experience any of the following: ? Bleeding from an injury or your nose that does not stop. ? Unusual colored urine (red or dark brown) or unusual colored stools (red or black). ? Unusual bruising for unknown reasons. ? A serious fall or if you hit your head (even if there is no bleeding).  Some medicines may interact with Eliquis and might increase your risk of bleeding or clotting while on Eliquis. To help avoid this, consult your healthcare provider or pharmacist prior to using any new prescription or non-prescription medications, including herbals, vitamins, non-steroidal anti-inflammatory drugs (NSAIDs) and supplements.  This website has more information on Eliquis (apixaban): http://www.eliquis.com/eliquis/home

## 2015-08-28 NOTE — Progress Notes (Signed)
Patient required every 30 minute checks due to removal of quick release belt while up in recliner. Patient pulled out NSL at approximately 1730 and IV therapy notified and reported they would not retry at this time due to multiple times patient has pulled out IV's. Will attempt to retry IV after patient in bed and resting . Continue with plan of care .  Lisa NeerJoyce, [name]

## 2015-08-28 NOTE — Progress Notes (Signed)
Physical Therapy Session Note  Patient Details  Name: Lisa SnellenFaye K Domagala MRN: [id number] Date of Birth: 08/14/1927  Today's Date: 08/28/2015 PT Individual Time: 1300-1400 PT Individual Time Calculation (min): 60 min   Short Term Goals: Week 1:  PT Short Term Goal 1 (Week 1): Patient will perform bed mobility with min A. PT Short Term Goal 2 (Week 1): Patient will transfer wheelchair <> bed with mod A.  PT Short Term Goal 3 (Week 1): Patient will initiate ambulation.  PT Short Term Goal 4 (Week 1): Patient will maintain standing balance x 2 min with assist of one person.   Skilled Therapeutic Interventions/Progress Updates:   Co-treatment with recreational therapist to increase patient engagement in therapeutic activity. Patient sitting in recliner, requesting to move per RN. Performed stand pivot transfers x 4 throughout session with max A to +2A and cues for sequencing, technique, and safety. Patient propelled wheelchair using BUE x 120 ft with supervision and mod encouragement. Patient with intermittent confusion throughout session and refused to perform any standing or ambulation tasks despite max encouragement and education on benefits of upright mobility because she stated she "had already done that twice today," discussed with treatment team who reported this did not occur. Patient sat edge of mat x 10 min while engaging in therapeutic conversation with mod multimodal cues for attention to task of listing desserts she likes to bake. Patient fatigued sitting edge of bed and requested to lie down. In sidelying, patient instructed in L hip abduction x 10. Patient refused to roll onto back but eventually agreeable with increased time and max encouragement. Patient performed supine BLE therex: SLR x 10 each LE, bridging x 10, hip adductor ball squeezes in hooklying x 10. Patient left sitting in recliner with quick release belt on and NT present to assist patient with eating lunch.   Therapy  Documentation Precautions:  Precautions Precautions: Fall Restrictions Weight Bearing Restrictions: No Vital Signs: Seated BP 140/88 Pain: Pain Assessment Pain Assessment: Faces Faces Pain Scale: No hurt   See Function Navigator for Current Functional Status.   Therapy/Group: Individual Therapy  Kerney ElbeVarner, Zarin Knupp A 08/28/2015, 2:35 PM

## 2015-08-28 NOTE — Progress Notes (Signed)
Speech Language Pathology Daily Session Note  Patient Details  Name: Lisa [name] MRN: [id number] Date of Birth: 04/30/1927  Today's Date: 08/28/2015 SLP Individual Time: 1000-1100 SLP Individual Time Calculation (min): 60 min  Short Term Goals: Week 1: SLP Short Term Goal 1 (Week 1): Patient will consume current diet with minimal overt s/s of aspiration with Min A verbal cues for use of swallow strategies.  SLP Short Term Goal 2 (Week 1): Patient will consume trials of nectar-thick liqiuds with minimal overt s/s of aspiration with Min A verbal cues over 3 consecutive sessions to assess readiness for repeat MBS SLP Short Term Goal 3 (Week 1): Patient will sustain attention to a functional task for 5 minutes with Mod A verbal cues for redirection.  SLP Short Term Goal 4 (Week 1): Patient will identify 2 physical and 2 cognitive deficits with Mod A question and semantic cues.  SLP Short Term Goal 5 (Week 1): Patient will initiate functional tasks with no more than 1 visual and 1 verbal cue in 75% of opportunities.  SLP Short Term Goal 6 (Week 1): Patient will demonstrate functional problem solving for basic and familiar tasks with Mod A verbal cues.   Skilled Therapeutic Interventions: Skilled treatment session focused on cognitive and dysphagia goals. Upon arrival, patient was sitting upright in the recliner and appeared awake and alert.  SLP facilitated session by providing supervision verbal cues for problem solving and thoroughness for oral care via the suction toothbrush.  Patient consumed trials of ice chips and thin liquids via teaspoon and cup with what appeared to be a timely swallow initiation and a wet vocal quality X 1 that cleared with a cued throat clear.  Due to patient's improved vocal quality, improved arousal with ability to follow commands and improved timing of her swallow initiation, recommend repeat MBS tomorrow to assess readiness for possible diet upgrade.  Patient demonstrated  intermittent confusion today with intermittent decreased auditory comprehension and phonemic paraphasias. RN and MD made aware.  Patient demonstrated selective attention in a mildly distracting environment for ~5 minutes with Min A verbal cues for redirection. Patient left upright in recliner with quick release belt in place and all needs within reach. Continue with current plan of care.    Function:  Eating Eating   Modified Consistency Diet: Yes Eating Assist Level: Supervision or verbal cues;More than reasonable amount of time           Cognition Comprehension Comprehension assist level: Understands basic 50 - 74% of the time/ requires cueing 25 - 49% of the time  Expression   Expression assist level: Expresses basic 50 - 74% of the time/requires cueing 25 - 49% of the time. Needs to repeat parts of sentences.  Social Interaction Social Interaction assist level: Interacts appropriately 25 - 49% of time - Needs frequent redirection.  Problem Solving Problem solving assist level: Solves basic 25 - 49% of the time - needs direction more than half the time to initiate, plan or complete simple activities  Memory Memory assist level: Recognizes or recalls 25 - 49% of the time/requires cueing 50 - 75% of the time    Pain Pain Assessment Pain Assessment: No/denies pain Faces Pain Scale: No hurt  Therapy/Group: Individual Therapy  Cheronda Erck 08/28/2015, 3:34 PM

## 2015-08-28 NOTE — Progress Notes (Signed)
Occupational Therapy Session Note  Patient Details  Name: Lisa [name] MRN: [id number] Date of Birth: 09/29/1926  Today's Date: 08/28/2015 OT Individual Time: 0800-0900 OT Individual Time Calculation (min): 60 min    Short Term Goals: Week 1:  OT Short Term Goal 1 (Week 1): Pt will be engaged in 10 minutes of functional self care tasks with requiring less than 1 rest break . OT Short Term Goal 2 (Week 1): Pt will perform LB dressing with max A in order to decrease level of assist needed with self care. OT Short Term Goal 3 (Week 1): Pt will perform UB dressing with min A in order to decrease level of assist needed with self care. OT Short Term Goal 4 (Week 1): Pt will perform toilet transfer with mod A in order to increase I with functional transfers.  Skilled Therapeutic Interventions/Progress Updates:    Pt resting in bed upon arrival. Pt engaged in BADL retraining including bathing and dressing with sit<>stand from w/c at sink.  Pt required max A for stand pivot transfer to w/c.  Pt required min A for remainder of sit<>stand at sink during bathing and dressing tasks.  Pt required mod A for stand pivot transfer to recliner at end of session.  Pt picked out clothing prior to engaging in bathing tasks.  Pt stated that she had already "washed up" earlier but did not resist when presented with bathing supplies.  Pt required BUE support when standing and required assistance with bathing buttocks and pulling up pants.  Pt required more than a reasonable amount of time to complete tasks with multiple rest breaks.  Focus on activity tolerance, sit<>stand, standing balance, task initiation, sequencing, cognitive remediation, functional transfers, and safety awareness.  Therapy Documentation Precautions:  Precautions Precautions: Fall Restrictions Weight Bearing Restrictions: No Pain:  Pt denied pain  See Function Navigator for Current Functional Status.   Therapy/Group: Individual  Therapy  Rich BraveLanier, Karmella Bouvier Chappell 08/28/2015, 9:58 AM

## 2015-08-28 NOTE — Progress Notes (Signed)
[place] PHYSICAL MEDICINE & REHABILITATION     PROGRESS NOTE    Subjective/Complaints: Patient reports she slept much better. States she "feels better today.".   ROS limited due to cognitive/communication deficits  Objective: Vital Signs: Blood pressure 122/64, pulse 90, temperature 98.6 F (37 C), temperature source Oral, resp. rate 18, height  (1.6 m), weight 67 kg (147 lb 11.3 oz), SpO2 97 %. No results found.  Recent Labs  08/27/15 0650  WBC 7.1  HGB 10.6*  HCT 33.4*  PLT 285    Recent Labs  08/27/15 0650  NA 136  K 4.6  CL 101  GLUCOSE 97  BUN 11  CREATININE 1.22*  CALCIUM 8.7*   CBG (last 3)   Recent Labs  08/27/15 1653 08/27/15 2054 08/28/15 0653  GLUCAP 98 98 88    Wt Readings from Last 3 Encounters:  08/28/15 67 kg (147 lb 11.3 oz)  08/22/15 67 kg (147 lb 11.3 oz)  08/03/15 65.772 kg (145 lb)    Physical Exam:  Constitutional: She is oriented to person, place, and time. She appears well-developed and well-nourished.     HENT:  Head: Normocephalic and atraumatic.  Right Ear: No decreased hearing is noted.  Left Ear: No decreased hearing is noted.  Mouth/Throat: Mucous membranes still dry. Debris on teeth as well..  Ecchymosis left forehead  resolved Eyes: Conjunctivae are normal. Pupils are equal, round, and reactive to light.   Neck: Normal range of motion. Neck supple.  Cardiovascular: Normal rate and regular rhythm.  Respiratory: Effort normal and breath sounds normal. No respiratory distress. She has no wheezes.  GI: Soft. Bowel sounds are normal. She exhibits no distension. There is no tenderness. There is no rebound.  Musculoskeletal: low back tender with truncal movement and palpation  Neurological: She is alert and oriented to person, place, and time. Left facial weakness. Limited insight and awareness. Skin: Skin is warm and dry.  Multiple scabbed areas bilateral feet. Gluteal cleft with dry    Motor 4/5 UE's.  LE's 2-3/5 at hips/knees partly due to pain. 4/5 ankles     Sensation intact to light touch in bilateral upper and lower limbs Skin: indurated area right forearm with dry dressing  Assessment/Plan: 1. Weakness, balance and cognitive deficits secondary to traumatic brain injury which require 3  hours per day of interdisciplinary therapy in a comprehensive inpatient rehab setting. (reduced to allow for fatigue,poor activity tolerance thus far) Physiatrist is providing close team supervision and 24 hour management of active medical problems listed below. Physiatrist and rehab team continue to assess barriers to discharge/monitor patient progress toward functional and medical goals.  Function:  Bathing Bathing position   Position: Bed  Bathing parts Body parts bathed by patient: Right arm, Left arm, Chest, Abdomen, Front perineal area Body parts bathed by helper: Buttocks, Right upper leg, Left upper leg, Right lower leg, Left lower leg, Back  Bathing assist Assist Level: Touching or steadying assistance(Pt > 75%)      Upper Body Dressing/Undressing Upper body dressing   What is the patient wearing?: Pull over shirt/dress     Pull over shirt/dress - Perfomed by patient: Thread/unthread right sleeve, Thread/unthread left sleeve, Put head through opening Pull over shirt/dress - Perfomed by helper: Pull shirt over trunk        Upper body assist Assist Level: Touching or steadying assistance(Pt > 75%)   Set up : To obtain clothing/put away  Lower Body Dressing/Undressing Lower body dressing   What  is the patient wearing?: Underwear, Pants   Underwear - Performed by helper: Thread/unthread right underwear leg, Thread/unthread left underwear leg, Pull underwear up/down   Pants- Performed by helper: Thread/unthread right pants leg, Thread/unthread left pants leg, Pull pants up/down, Fasten/unfasten pants   Non-skid slipper socks- Performed by helper: Don/doff right sock, Don/doff left  sock                  Lower body assist Assist for lower body dressing: Touching or steadying assistance (Pt > 75%)      Toileting Toileting Toileting activity did not occur: No continent bowel/bladder event   Toileting steps completed by helper: Adjust clothing prior to toileting, Performs perineal hygiene, Adjust clothing after toileting Toileting Assistive Devices: Grab bar or rail  Toileting assist Assist level: Two helpers   Transfers Chair/bed transfer   Chair/bed transfer method: Squat pivot Chair/bed transfer assist level: 2 helpers Chair/bed transfer assistive device: Designer, fashion/clothingArmrests     Locomotion Ambulation Ambulation activity did not occur: Safety/medical concerns   Max distance: 6 (x 4) Assist level: 2 helpers   Wheelchair   Type: Manual Max wheelchair distance: 120 Assist Level: Supervision or verbal cues  Cognition Comprehension Comprehension assist level: Understands basic 50 - 74% of the time/ requires cueing 25 - 49% of the time  Expression Expression assist level: Expresses basic 50 - 74% of the time/requires cueing 25 - 49% of the time. Needs to repeat parts of sentences.  Social Interaction Social Interaction assist level: Interacts appropriately 25 - 49% of time - Needs frequent redirection.  Problem Solving Problem solving assist level: Solves basic 25 - 49% of the time - needs direction more than half the time to initiate, plan or complete simple activities  Memory Memory assist level: Recognizes or recalls 25 - 49% of the time/requires cueing 50 - 75% of the time   Medical Problem List and Plan: 1. 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 DVT Prophylaxis/Anticoagulation: bilateral ?distal femoral DVT's---eliquis  -re-doppler Friday 3. Chronic back pain/Pain Management: Used hydrocodone tid due to  back and LE pain. [facility]   -scheduled hydrocodone to help with activity tolerance     4. Mood: LCSW to follow for evaluation and support.  5. Neuropsych: This patient is not fully capable of making decisions on her own behalf.  -suspect confusion was related to urosepsis    6. Skin/Wound Care: Routine pressure relief measures. Maintain adequate nutrition and hyrdration status.  7. Fluids/Electrolytes/Nutrition: Continue Dysphagia 1, honey liquids as swallow function shows minimal improvement. IVF at night and push fluids during the day.   8. Acute on chronic renal failure: Cr at 1.22--push po fluids---recheck labs thursday 9. HTN: Monitor tid for now.  10. Acute on chronic Iron deficiency anemia: Added iron supplement. hgb 8.9 today,  i would assume some of this is dilutional in addtion to #13 11. Aspiration PNA/H flu PNA: Has completed antibiotic course.  12. 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 Continue amiodarone daily and lopressor bid. To follow up with [facility] after discharge.  13. Rectal bleeding: Has resolved--without  recurrence. Will need follow up evaluation after discharge.  14. Depression: Used to take Celexa and Abilify PTA with good results per family.  15. Urinary retention?: Foley replaced due to poor UOP---continue for now.  -100k E Coli---  keflex 250mg  tid x 7  days 16. Cellulitis right forearm: continue local dressing   LOS (Days) 6 A FACE TO FACE EVALUATION WAS PERFORMED  Lisa [name] 08/28/2015 9:07 AM

## 2015-08-29 ENCOUNTER — Inpatient Hospital Stay (HOSPITAL_COMMUNITY): Payer: Medicare Other | Admitting: Physical Therapy

## 2015-08-29 ENCOUNTER — Encounter (HOSPITAL_COMMUNITY): Payer: Medicare Other | Admitting: Speech Pathology

## 2015-08-29 ENCOUNTER — Inpatient Hospital Stay (HOSPITAL_COMMUNITY): Payer: Medicare Other | Admitting: Occupational Therapy

## 2015-08-29 ENCOUNTER — Inpatient Hospital Stay (HOSPITAL_COMMUNITY): Payer: Medicare Other

## 2015-08-29 LAB — GLUCOSE, CAPILLARY
GLUCOSE-CAPILLARY: 125 mg/dL — AB (ref 65–99)
GLUCOSE-CAPILLARY: 122 mg/dL — AB (ref 65–99)
Glucose-Capillary: 114 mg/dL — ABNORMAL HIGH (ref 65–99)
Glucose-Capillary: 76 mg/dL (ref 65–99)

## 2015-08-29 MED ORDER — RISPERIDONE 0.25 MG PO TABS
0.2500 mg | ORAL_TABLET | Freq: Every day | ORAL | Status: DC
Start: 2015-08-29 — End: 2015-08-30
  Administered 2015-08-29: 0.25 mg via ORAL
  Filled 2015-08-29 (×2): qty 1

## 2015-08-29 MED ORDER — ENSURE ENLIVE PO LIQD
237.0000 mL | Freq: Three times a day (TID) | ORAL | Status: DC
  Administered 2015-08-29 – 2015-09-12 (×32): 237 mL via ORAL

## 2015-08-29 MED ORDER — RANITIDINE HCL 150 MG PO TABS
150.0000 mg | ORAL_TABLET | Freq: Two times a day (BID) | ORAL | Status: DC
  Administered 2015-08-29 – 2015-08-30 (×2): 150 mg via ORAL
  Filled 2015-08-29 (×4): qty 1

## 2015-08-29 NOTE — Progress Notes (Signed)
Occupational Therapy Session Note  Patient Details  Name: Lisa SnellenFaye K Vondrasek MRN: [id number] Date of Birth: 10/21/1926  Today's Date: 08/29/2015 OT Individual Time: 1100-1157 and [id number] OT Individual Time Calculation (min): 57 min and 27 min (18 missed minutes from fatigue)   Short Term Goals: Week 1:  OT Short Term Goal 1 (Week 1): Pt will be engaged in 10 minutes of functional self care tasks with requiring less than 1 rest break . OT Short Term Goal 2 (Week 1): Pt will perform LB dressing with max A in order to decrease level of assist needed with self care. OT Short Term Goal 3 (Week 1): Pt will perform UB dressing with min A in order to decrease level of assist needed with self care. OT Short Term Goal 4 (Week 1): Pt will perform toilet transfer with mod A in order to increase I with functional transfers.  Skilled Therapeutic Interventions/Progress Updates:  Session 1: Upon entering the room, pt supine in bed with 5/10 c/o pain in lower back this session. Pt requiring coaxing for participation this session. Pt performed supine >sit with min A to EOB. Max A stand pivot transfer into wheelchair. Pt seated in wheelchair at sink side for dressing and bathing tasks. Pt requiring mod verbal cues for initiation and sequencing of tasks. Pt required +2 assist to safety stand from wheelchair with to wash buttocks and for LB clothing management. Pt was incontinent of bowel in brief and was unaware which required increased time to stand for clean up. Pt grooming while seated in wheelchair with set up A as well. Pt transferring to recliner chair with max A for stand pivot. Quick release belt donned and call bell within reach upon exiting the room.   Session 2: Upon entering the room, pt supine in bed with no c/o pain. Pt reporting extreme fatigue this session. Pt required maximal encouragement for participation in OT session this afternoon. Pt declined OOB tasks this session. Pt raising voice multiple times  during session when therapist asked pt to participate in a variety of tasks. Pt performed supine >sit with steady assistance and increased time. Pt seated on EOB with B feet supported for familiar leisure task. Pt able to state name of game once she saw cards but unable to verbally state directions how to play old maid. Pt seated for ~15 minutes while engaged in matching task with cards to make 12 matches. Pt becoming fatigue and leaning against bed and putting cards multiple times during this task and stating, "I'm tired." Pt laying down and placing feet back into bed with supervision and 18 minutes missed of scheduled therapy secondary to pt fatigue. Bed alarm activated and call bell within reach upon exiting the room.   Therapy Documentation Precautions:  Precautions Precautions: Fall Restrictions Weight Bearing Restrictions: No  See Function Navigator for Current Functional Status.   Therapy/Group: Individual Therapy  Lowella Gripittman, Iyari Hagner L 08/29/2015, 12:28 PM

## 2015-08-29 NOTE — Progress Notes (Signed)
Physical Therapy Session Note  Patient Details  Name: Lisa [name] MRN: [id number] Date of Birth: 03/05/1927  Today's Date: 08/29/2015 PT Individual Time: 1415-1500 PT Individual Time Calculation (min): 45 min   Short Term Goals: Week 1:  PT Short Term Goal 1 (Week 1): Patient will perform bed mobility with min A. PT Short Term Goal 2 (Week 1): Patient will transfer wheelchair <> bed with mod A.  PT Short Term Goal 3 (Week 1): Patient will initiate ambulation.  PT Short Term Goal 4 (Week 1): Patient will maintain standing balance x 2 min with assist of one person.   Skilled Therapeutic Interventions/Progress Updates:    Pt received seated in w/c, c/o B foot pain as described below and agreeable to treatment with encouragement. Inspected B feet and noted scab/ulceration on B feet which pt reports is from recent fall. Requests shoes not be put back on; therapist donned yellow socks totalA. Engaged pt in conversation including discussion about family, visual scanning with picture book with pt and grandchild. Pt confused throughout session, frequently asking about individuals unknown to therapist, tired and requesting to return to bed periodically however appropriate and pleasant through session. W/c propulsion x150' with S and verbal cues for navigation; pt reporting she absolutely does not want to go to the gym. [employer] with BUE reaching for sustained attention, visual scanning, memory and recall. Requires min cues to recall directions for various activities. Returned to room totalA; Stand pivot to return to bed mod/maxA with armrests and bedrails. Sit >supine minA. Remained supine in bed at completion of session with alarm intact and all needs within reach.   Therapy Documentation Precautions:  Precautions Precautions: Fall Restrictions Weight Bearing Restrictions: No Pain: Pain Assessment Pain Assessment: 0-10 Pain Score: 5  Faces Pain Scale: No hurt Pain Type: Chronic pain Pain  Location: Foot Pain Orientation: Right;Left Pain Descriptors / Indicators: Grimacing;Aching Pain Onset: On-going Patients Stated Pain Goal: 0 Pain Intervention(s):  (removed shoes) Multiple Pain Sites: No   See Function Navigator for Current Functional Status.   Therapy/Group: Individual Therapy  Vista Lawmanlizabeth J Tygielski 08/29/2015, 3:33 PM

## 2015-08-29 NOTE — Progress Notes (Signed)
[place] PHYSICAL MEDICINE & REHABILITATION     PROGRESS NOTE    Subjective/Complaints: More alert. "feel fine"  RN reports hs restlessness and IV pulled out again.    ROS limited due to cognitive/communication deficits  Objective: Vital Signs: Blood pressure 133/49, pulse 81, temperature 98.3 F (36.8 C), temperature source Oral, resp. rate 18, height 5\' 3"  (1.6 m), weight 63.4 kg (139 lb 12.4 oz), SpO2 96 %. No results found.  Recent Labs  08/27/15 0650  WBC 7.1  HGB 10.6*  HCT 33.4*  PLT 285    Recent Labs  08/27/15 0650  NA 136  K 4.6  CL 101  GLUCOSE 97  BUN 11  CREATININE 1.22*  CALCIUM 8.7*   CBG (last 3)   Recent Labs  08/28/15 1720 08/28/15 2129 08/29/15 0704  GLUCAP 171* 83 114*    Wt Readings from Last 3 Encounters:  08/29/15 63.4 kg (139 lb 12.4 oz)  08/22/15 67 kg (147 lb 11.3 oz)  08/03/15 65.772 kg (145 lb)    Physical Exam:  Constitutional: She is oriented to person, place, and time. She appears well-developed and well-nourished.     HENT:  Head: Normocephalic and atraumatic.  Right Ear: No decreased hearing is noted.  Left Ear: No decreased hearing is noted.  Mouth/Throat: Mucous membranes still dry. Debris on teeth as well..  Ecchymosis left forehead  resolved Eyes: Conjunctivae are normal. Pupils are equal, round, and reactive to light.   Neck: Normal range of motion. Neck supple.  Cardiovascular: Normal rate and regular rhythm.  Respiratory: Effort normal and breath sounds normal. No respiratory distress. She has no wheezes.  GI: Soft. Bowel sounds are normal. She exhibits no distension. There is no tenderness. There is no rebound.  Musculoskeletal: low back tender with truncal movement and palpation  Neurological: She is alert and oriented to person, place, and time. Left facial weakness. Limited insight and awareness. Skin: Skin is warm and dry.  Multiple scabbed areas bilateral feet healing    Motor 4/5 UE's.  LE's 2-3/5 at hips/knees partly due to pain. 4/5 ankles     Sensation intact to light touch in bilateral upper and lower limbs Skin: indurated area right forearm with dry dressing  Assessment/Plan: 1. Weakness, balance and cognitive deficits secondary to traumatic brain injury which require 3  hours per day of interdisciplinary therapy in a comprehensive inpatient rehab setting. (reduced to allow for fatigue,poor activity tolerance thus far) Physiatrist is providing close team supervision and 24 hour management of active medical problems listed below. Physiatrist and rehab team continue to assess barriers to discharge/monitor patient progress toward functional and medical goals.  Function:  Bathing Bathing position   Position: Wheelchair/chair at sink  Bathing parts Body parts bathed by patient: Right arm, Left arm, Chest, Abdomen, Right upper leg, Left upper leg Body parts bathed by helper: Front perineal area, Buttocks, Right lower leg, Left lower leg  Bathing assist Assist Level: 2 helpers      Upper Body Dressing/Undressing Upper body dressing   What is the patient wearing?: Pull over shirt/dress     Pull over shirt/dress - Perfomed by patient: Thread/unthread right sleeve, Thread/unthread left sleeve, Put head through opening Pull over shirt/dress - Perfomed by helper: Pull shirt over trunk        Upper body assist Assist Level: Set up   Set up : To obtain clothing/put away  Lower Body Dressing/Undressing Lower body dressing   What is the patient wearing?: Underwear, Pants,  Socks   Underwear - Performed by helper: Thread/unthread right underwear leg, Thread/unthread left underwear leg, Pull underwear up/down   Pants- Performed by helper: Thread/unthread right pants leg, Thread/unthread left pants leg, Pull pants up/down, Fasten/unfasten pants   Non-skid slipper socks- Performed by helper: Don/doff right sock, Don/doff left sock                  Lower body assist  Assist for lower body dressing: Touching or steadying assistance (Pt > 75%)      Toileting Toileting Toileting activity did not occur: No continent bowel/bladder event   Toileting steps completed by helper: Adjust clothing prior to toileting, Performs perineal hygiene, Adjust clothing after toileting Toileting Assistive Devices: Grab bar or rail  Toileting assist Assist level: Two helpers   Transfers Chair/bed transfer   Chair/bed transfer method: Stand pivot, Squat pivot Chair/bed transfer assist level: 2 helpers Chair/bed transfer assistive device: Armrests     Locomotion Ambulation Ambulation activity did not occur: Safety/medical concerns   Max distance: 6 (x 4) Assist level: 2 helpers   Wheelchair   Type: Manual Max wheelchair distance: 120 Assist Level: Supervision or verbal cues  Cognition Comprehension Comprehension assist level: Understands basic 50 - 74% of the time/ requires cueing 25 - 49% of the time  Expression Expression assist level: Expresses basic 50 - 74% of the time/requires cueing 25 - 49% of the time. Needs to repeat parts of sentences.  Social Interaction Social Interaction assist level: Interacts appropriately 25 - 49% of time - Needs frequent redirection.  Problem Solving Problem solving assist level: Solves basic 25 - 49% of the time - needs direction more than half the time to initiate, plan or complete simple activities  Memory Memory assist level: Recognizes or recalls 25 - 49% of the time/requires cueing 50 - 75% of the time   Medical Problem List and Plan: 1. 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 DVT Prophylaxis/Anticoagulation: bilateral ?distal femoral DVT's---eliquis  -re-doppler Friday 3. Chronic back pain/Pain Management: Used hydrocodone tid due to back and LE pain. Last [facility]  07/16/15.   -scheduled hydrocodone to help with activity tolerance     4. Mood: LCSW to follow for evaluation and support.  5. Neuropsych: This patient is not fully capable of making decisions on her own behalf.  -suspect confusion was related to urosepsis  -tends to sundown.   -safety plan to prevent fall.   -will add low dose risperdal QHS for confusion and to encourage sleep    6. Skin/Wound Care: Routine pressure relief measures. Maintain adequate nutrition and hyrdration status.  7. Fluids/Electrolytes/Nutrition: Continue Dysphagia 1, honey liquids as swallow function shows minimal improvement. IVF at night as possible given NSL situation and push fluids during the day.   8. Acute on chronic renal failure: Cr at 1.22--push po fluids---recheck labs thursday 9. HTN: Monitor tid for now.  10. Acute on chronic Iron deficiency anemia: Added iron supplement. hgb 8.9 today,  i would assume some of this is dilutional in addtion to #13 11. Aspiration PNA/H flu PNA: Has completed antibiotic course.  12. 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 Continue amiodarone daily and lopressor bid. To follow up with [facility] after discharge.  13. Rectal bleeding: Has resolved--without  recurrence. Will need follow up evaluation after discharge.  14. Depression: Used to take Celexa and Abilify PTA  with good results per family.  15. Urinary retention?: Foley replaced due to poor UOP---continue for now.  -100k E Coli---  keflex  tid x 7 days 16. Cellulitis right forearm: likely small hematoma---continue local care---area improved   LOS (Days) 7 A FACE TO FACE EVALUATION WAS PERFORMED  Heyward Douthit T 08/29/2015 8:53 AM

## 2015-08-29 NOTE — Progress Notes (Signed)
Nutrition Follow-up  DOCUMENTATION CODES:   Not applicable  INTERVENTION:  Provide Ensure Enlive po TID, each supplement provides 350 kcal and 20 grams of protein.  Encourage adequate PO intake.  NUTRITION DIAGNOSIS:   Inadequate oral intake related to poor appetite as evidenced by meal completion < 25%; ongoing  GOAL:   Patient will meet greater than or equal to 90% of their needs; progressing  MONITOR:   PO intake, Supplement acceptance, Diet advancement, Weight trends, I & O's, Labs, Skin  REASON FOR ASSESSMENT:   Malnutrition Screening Tool    ASSESSMENT:   81 y.o. Left handed female with history of HTN, depression, spinal stenosis with BLE weakness and post laminectomy syndrome with chronic pain who was admitted on 08/03/15 after a fall in parking lot and subsequent acute traumatic SAH along frontal convexities and acute SDH with confusion and waxing and waining of mental status.  She continues to have bouts of lethargy and follow up CT 11/27 showed overall improvement with near resolution of SAH and bifrontal parenchymal hemorrhage as well as subtle new 3 mm midline shift.  Diet has been advanced to a dysphagia 2 diet with thin liquids. Prior to diet advancement, meal completion has been 5-20%. RD to order nutritional supplements to aid in caloric and protein needs. RD to continue to monitor.   Diet Order:  DIET DYS 2 Room service appropriate?: Yes; Fluid consistency:: Thin  Skin:  Wound (see comment) (Stg II pressure ulcer on coccyx)  Last BM:  12/4  Height:   Ht Readings from Last 1 Encounters:  08/22/15 5\' 3"  (1.6 m)    Weight:   Wt Readings from Last 1 Encounters:  08/29/15 139 lb 12.4 oz (63.4 kg)    Ideal Body Weight:  52.2 kg  BMI:  Body mass index is 24.77 kg/(m^2).  Estimated Nutritional Needs:   Kcal:  1600-1800  Protein:  80-90 grams  Fluid:  1.6 -1.8 L/day  EDUCATION NEEDS:   No education needs identified at this time  Lisa SmilingStephanie  Savas Elvin, MS, RD, LDN Pager # [phone] After hours/ weekend pager # [phone]

## 2015-08-29 NOTE — Progress Notes (Signed)
Physical Therapy Session Note  Patient Details  Name: Lisa [name] MRN: [id number] Date of Birth: 07/16/1927  Today's Date: 08/29/2015 PT Individual Time: 1330-1400 PT Individual Time Calculation (min): 30 min   Short Term Goals: Week 1:  PT Short Term Goal 1 (Week 1): Patient will perform bed mobility with min A. PT Short Term Goal 2 (Week 1): Patient will transfer wheelchair <> bed with mod A.  PT Short Term Goal 3 (Week 1): Patient will initiate ambulation.  PT Short Term Goal 4 (Week 1): Patient will maintain standing balance x 2 min with assist of one person.   Skilled Therapeutic Interventions/Progress Updates:   Session focused on initiation, active participation in therapy, and attention to task with standing tolerance, ambulation, and activity tolerance. Patient received in recliner, performed sit <> stand with max A and ambulated about 6 ft to wheelchair with 2-person HHA, patient demonstrating strong posterior lean and small step length with narrow BOS. Patient propelled wheelchair using BUE to gym with supervision and min A for obstacle negotiation going through doorways. In parallel bars, patient transferred sit <>stand with BUE support on bars and mod A and ambulated forwards 2 trials x 8 ft with seated rest between and wheelchair follow for safety. Patient continually stating, "I won't do this, I've already done therapy twice today," but was agreeable to attempt mobility with max encouragement. Patient left sitting in wheelchair with quick release belt and call bell on lap on to await following PT session in 15 min, RN notified of patient position.   Therapy Documentation Precautions:  Precautions Precautions: Fall Restrictions Weight Bearing Restrictions: No Pain: Pain Assessment Pain Assessment: Faces Faces Pain Scale: No hurt   See Function Navigator for Current Functional Status.   Therapy/Group: Individual Therapy  Kerney ElbeVarner, Hadja Harral A 08/29/2015, 2:34 PM

## 2015-08-29 NOTE — Progress Notes (Signed)
MBSS complete. Full report located under chart review in imaging section.   Caidyn Henricksen, M.A., CCC-SLP [phone] 

## 2015-08-30 ENCOUNTER — Inpatient Hospital Stay (HOSPITAL_COMMUNITY): Payer: Medicare Other | Admitting: Speech Pathology

## 2015-08-30 ENCOUNTER — Encounter (HOSPITAL_COMMUNITY): Payer: Medicare Other

## 2015-08-30 ENCOUNTER — Inpatient Hospital Stay (HOSPITAL_COMMUNITY): Payer: Medicare Other | Admitting: Physical Therapy

## 2015-08-30 ENCOUNTER — Inpatient Hospital Stay (HOSPITAL_COMMUNITY): Payer: Medicare Other | Admitting: *Deleted

## 2015-08-30 ENCOUNTER — Inpatient Hospital Stay (HOSPITAL_COMMUNITY): Payer: Medicare Other

## 2015-08-30 DIAGNOSIS — I82402 Acute embolism and thrombosis of unspecified deep veins of left lower extremity: Secondary | ICD-10-CM

## 2015-08-30 LAB — CBC WITH DIFFERENTIAL/PLATELET
Basophils Absolute: 0 10*3/uL (ref 0.0–0.1)
Basophils Relative: 1 %
EOS PCT: 2 %
Eosinophils Absolute: 0.1 10*3/uL (ref 0.0–0.7)
HEMATOCRIT: 35.5 % — AB (ref 36.0–46.0)
Hemoglobin: 10.8 g/dL — ABNORMAL LOW (ref 12.0–15.0)
LYMPHS ABS: 1.1 10*3/uL (ref 0.7–4.0)
LYMPHS PCT: 16 %
MCH: 29.7 pg (ref 26.0–34.0)
MCHC: 30.4 g/dL (ref 30.0–36.0)
MCV: 97.5 fL (ref 78.0–100.0)
MONO ABS: 0.8 10*3/uL (ref 0.1–1.0)
Monocytes Relative: 12 %
NEUTROS ABS: 4.6 10*3/uL (ref 1.7–7.7)
Neutrophils Relative %: 69 %
PLATELETS: 288 10*3/uL (ref 150–400)
RBC: 3.64 MIL/uL — AB (ref 3.87–5.11)
RDW: 17 % — ABNORMAL HIGH (ref 11.5–15.5)
WBC: 6.6 10*3/uL (ref 4.0–10.5)

## 2015-08-30 LAB — BASIC METABOLIC PANEL
Anion gap: 9 (ref 5–15)
BUN: 15 mg/dL (ref 6–20)
CALCIUM: 8.7 mg/dL — AB (ref 8.9–10.3)
CHLORIDE: 101 mmol/L (ref 101–111)
CO2: 27 mmol/L (ref 22–32)
CREATININE: 1.28 mg/dL — AB (ref 0.44–1.00)
GFR calc Af Amer: 43 mL/min — ABNORMAL LOW (ref >60)
GFR calc non Af Amer: 37 mL/min — ABNORMAL LOW (ref >60)
Glucose, Bld: 82 mg/dL (ref 65–99)
Potassium: 3.9 mmol/L (ref 3.5–5.1)
SODIUM: 137 mmol/L (ref 135–145)

## 2015-08-30 LAB — GLUCOSE, CAPILLARY
GLUCOSE-CAPILLARY: 83 mg/dL (ref 65–99)
Glucose-Capillary: 107 mg/dL — ABNORMAL HIGH (ref 65–99)

## 2015-08-30 MED ORDER — SENNOSIDES-DOCUSATE SODIUM 8.6-50 MG PO TABS
1.0000 | ORAL_TABLET | Freq: Every day | ORAL | Status: DC
  Administered 2015-08-30 – 2015-09-11 (×13): 1 via ORAL
  Filled 2015-08-30 (×13): qty 1

## 2015-08-30 MED ORDER — RISPERIDONE 0.25 MG PO TABS
0.2500 mg | ORAL_TABLET | Freq: Every day | ORAL | Status: DC
  Filled 2015-08-30: qty 1

## 2015-08-30 NOTE — Progress Notes (Signed)
Social Work Patient ID: [name], female   DOB: [birth year], 81 y.o.   MRN: [id number]  Anselm Pancoast, LCSW Social Worker Signed  Patient Care Conference 08/30/2015  4:27 PM    Expand All Collapse All   Inpatient RehabilitationTeam Conference and Plan of Care Update Date: 08/28/2015   Time: 2:45 PM     Patient Name: Lisa [name]       Medical Record Number: [id number]  Date of Birth: March 27, 1927 Sex: Female         Room/Bed: 4W18C/4W18C-01 Payor Info: Payor: MEDICARE / Plan: MEDICARE PART A AND B / Product Type: *No Product type* /    Admitting Diagnosis: TBI after a fall   Admit Date/Time:  08/22/2015  2:45 PM Admission Comments: No comment available   Primary Diagnosis:  TBI (traumatic brain injury) (HCC) Principal Problem: TBI (traumatic brain injury) [facility])    Patient Active Problem List     Diagnosis  Date Noted   .  Bacterial UTI  08/27/2015   .  DVT, lower extremity (HCC)  08/27/2015   .  TBI (traumatic brain injury) (HCC)  08/22/2015   .  ICH (intracerebral hemorrhage) (HCC)     .  Abscess     .  Hypomagnesemia     .  SDH (subdural hematoma) (HCC)     .  Boil of upper extremity     .  Pneumonia due to Haemophilus influenzae (HCC)     .  Chronic systolic CHF (congestive heart failure) (HCC)     .  Acute on chronic renal failure (HCC)     .  Hypokalemia     .  Bright red blood per rectum     .  Dysphagia     .  Pressure ulcer  08/16/2015   .  Acute respiratory failure with hypoxemia (HCC)     .  Hypernatremia     .  Subdural hematoma (HCC)  08/03/2015   .  Lumbar radiculopathy  06/06/2015   .  Spinal stenosis, lumbar region, with neurogenic claudication  04/02/2015   .  Degenerative lumbar disc  02/15/2015   .  Lumbar post-laminectomy syndrome  02/15/2015   .  Facet syndrome, lumbar  02/15/2015   .  Sacroiliac joint dysfunction  02/15/2015     Expected Discharge Date: Expected Discharge Date: 09/12/15  Team Members Present: Physician leading conference: Dr.  Faith Rogue Social Worker Present: Amada Jupiter, LCSW Nurse Present: Chana Bode, RN PT Present: Teodoro Kil, PT;Rebecca Evelina Bucy, PT OT Present: Callie Fielding, OT;Ardis Rowan, COTA SLP Present: Feliberto Gottron, SLP PPS Coordinator present : Tora Duck, RN, CRRN        Current Status/Progress  Goal  Weekly Team Focus   Medical     TBI, slow to progress, hs confusion likely exacerbated by UTI   improve mentation, awareness  sleep, rx UTiI, improve hydraition and overall nutrition    Bowel/Bladder     foley incontinent at times of bowel LBM 12-4   manage bowel and bladder   discontinue foley   Swallow/Nutrition/ Hydration     Dys. 1 textures with honey-thick liquids, Mod A for use of swallow strategies  Supervision with least restrictive diet   Trials of upgraded liquids/textures, Repeat MBS   ADL's     functional transfers-tot A + 2, UB bathing/dressing-min A; LB bathing/dressing-max A; limited activity tolerance; standing balance-max A  min A overall  activity tolerance, standing balance, funcitonal transfers, BADL retraining  Mobility     squat pivot/stand pivot transfers with max A to +2A, supervision wheelchair mobility   min A transfers and short distance household ambulation   participation, initation, functional transfers, standing balance, initiating ambulation, strengthening, activity tolerance, pt/family education   Communication               Safety/Cognition/ Behavioral Observations    max assist will get up without assist mod- max stand pivot    min assist  reinforce safety needs work on attention , functional problem solving , awaress and recall   Pain     low back pain , headaches, nausea vicodin 1 po scheduled and every 6 hours  less than 4   monitor effectiveness of pain meds     Skin     buttocks red allevyn dressing applied skin to bilateral arms fragile   no new breakdown   monitor buttocks and turn every 2 hours as patient allows while in bed    Rehab  Goals Patient on target to meet rehab goals: Yes *See Care Plan and progress notes for long and short-term goals.    Barriers to Discharge:  age, neurological deficits     Possible Resolutions to Barriers:   supervision and assistance by family at discharge, ongoing NMR, rx medical issues     Discharge Planning/Teaching Needs:   Plan for pt to d/c to her home with 24/7 care being arranged (she does have a LTC policy) by son.  Teaching to be scheduled closer to d/c.    Team Discussion:    Voicing better but different presentation today with ST - some s/s of aphasia?  Some changes in behavior as well - MD aware.  PT has had difficulty getting pt to participate.  Requires encouragement.  Min assist goals overall.  SW reports son is working on care at home.   Revisions to Treatment Plan:    NA    Continued Need for Acute Rehabilitation Level of Care: The patient requires daily medical management by a physician with specialized training in physical medicine and rehabilitation for the following conditions: Daily direction of a multidisciplinary physical rehabilitation program to ensure safe treatment while eliciting the highest outcome that is of practical value to the patient.: Yes Daily analysis of laboratory values and/or radiology reports with any subsequent need for medication adjustment of medical intervention for : Neurological problems;Other;Post surgical problems  Abhi Moccia 08/30/2015, 4:27 PM

## 2015-08-30 NOTE — Progress Notes (Addendum)
Patient continues to complain of fatigue and reported to have increased confusion. She has had poor sleep and was started on Risperdal yesterday to help with sleep hygiene.   Will d/c Risperdal. Check CBC to monitor WBC trend and CCT as was recently started on Eliquis.

## 2015-08-30 NOTE — Progress Notes (Signed)
Social Work Patient ID: Marita SnellenFaye K Biela, female   DOB: 10/04/1926, 81 y.o.   MRN: [id number]   Spoke with pt and son (via phone) following team conference and reviewed targeted d/c date and goals.  Both aware and agreeable to targets and son plans to return to town next week.  Have supplied son with list of private duty agencies so he can go ahead and begin making contacts for arrangements of home care.  Will continue to follow for d/c planning and support needs.  Tyree Fluharty, LCSW

## 2015-08-30 NOTE — Progress Notes (Signed)
Speech Language Pathology Weekly Progress and Session Note  Patient Details  Name: Lisa [name] MRN: [id number] Date of Birth: May 19, 1927  Beginning of progress report period: August 22, 2015 End of progress report period: August 30, 2015  Today's Date: 08/30/2015 SLP Individual Time: 1497-0263 SLP Individual Time Calculation (min): 30 min  Short Term Goals: Week 1: SLP Short Term Goal 1 (Week 1): Patient will consume current diet with minimal overt s/s of aspiration with Min A verbal cues for use of swallow strategies.  SLP Short Term Goal 1 - Progress (Week 1): Met SLP Short Term Goal 2 (Week 1): Patient will consume trials of nectar-thick liqiuds with minimal overt s/s of aspiration with Min A verbal cues over 3 consecutive sessions to assess readiness for repeat MBS SLP Short Term Goal 2 - Progress (Week 1): Met SLP Short Term Goal 3 (Week 1): Patient will sustain attention to a functional task for 5 minutes with Mod A verbal cues for redirection.  SLP Short Term Goal 3 - Progress (Week 1): Not met SLP Short Term Goal 4 (Week 1): Patient will identify 2 physical and 2 cognitive deficits with Mod A question and semantic cues.  SLP Short Term Goal 4 - Progress (Week 1): Not met SLP Short Term Goal 5 (Week 1): Patient will initiate functional tasks with no more than 1 visual and 1 verbal cue in 75% of opportunities.  SLP Short Term Goal 5 - Progress (Week 1): Not met SLP Short Term Goal 6 (Week 1): Patient will demonstrate functional problem solving for basic and familiar tasks with Mod A verbal cues.  SLP Short Term Goal 6 - Progress (Week 1): Not met    New Short Term Goals: Week 2: SLP Short Term Goal 1 (Week 2): Patient will consume current diet with minimal overt s/s of aspiration with supervision verbal cues for use of swallow strategies.  SLP Short Term Goal 2 (Week 2): Patient will sustain attention to a functional task for 5 minutes with Mod A verbal cues for redirection.   SLP Short Term Goal 3 (Week 2): Patient will identify 2 physical and 2 cognitive deficits with Mod A question and semantic cues.  SLP Short Term Goal 4 (Week 2): Patient will initiate functional tasks with no more than 1 visual and 1 verbal cue in 75% of opportunities.  SLP Short Term Goal 5 (Week 2): Patient will demonstrate functional problem solving for basic and familiar tasks with Mod A verbal cues.   Weekly Progress Updates: Patient has made minimal but functional gains and has met 2 of 6 STG's this reporting period due to improved swallowing function. Patient had repeat MBS on 12/7 and was upgraded to Dys. 2 textures with thin liquids. Patient is consuming her meal with minimal overt s/s of aspiration and requires Min A verbal cues for use of swallowing compensatory strategies. Patient is demonstrating behaviors consistent with a Rancho Level V due to increased confusion and need for overall Max-total A for orientation, attention, initiation, problem solving, recall, awareness and safety with functional and familiar tasks. RN, PA and physician are aware. Patient and family education is ongoing. Patient would benefit from continued skilled SLP intervention to maximize cognitive and swallowing function and overall functional independence prior to discharge.    Intensity: Minumum of 1-2 x/day, 30 to 90 minutes Frequency: 3 to 5 out of 7 days Duration/Length of Stay: 09/12/15 Treatment/Interventions: Cognitive remediation/compensation;Cueing hierarchy;Functional tasks;Environmental controls;Internal/external aids;Dysphagia/aspiration precaution training;Therapeutic Activities;Patient/family education   Daily Session  Skilled Therapeutic Interventions: Skilled treatment session focused on cognitive goals. Upon arrival, patient was sitting upright in the recliner and appeared fatigued. Patient required total A multimodal cues for orientation to time and place but was oriented to situation with Min  A verbal cues. Patient reporting she needed to void and demanded to use the toilet despite having a catheter in place. Patient transferred to the wheelchair with total +2 assist for safety and then declined to use the bathroom. Patient completed oral care at the sink with Max A verbal cues for problem solving with task. Patient continues to demonstrate language of confusion with phonemic paraphasias, RN and PA made aware. Patient transferred back to the recliner with total +2 assist for safety and left with quick release belt in place and all needs within reach. Continue with current plan of care.    Function:   Eating Eating   Modified Consistency Diet: Yes Eating Assist Level: More than reasonable amount of time;Supervision or verbal cues   Eating Set Up Assist For: Opening containers       Cognition Comprehension Comprehension assist level: Understands basic 50 - 74% of the time/ requires cueing 25 - 49% of the time  Expression   Expression assist level: Expresses basic 50 - 74% of the time/requires cueing 25 - 49% of the time. Needs to repeat parts of sentences.  Social Interaction Social Interaction assist level: Interacts appropriately 25 - 49% of time - Needs frequent redirection.  Problem Solving Problem solving assist level: Solves basic 25 - 49% of the time - needs direction more than half the time to initiate, plan or complete simple activities  Memory Memory assist level: Recognizes or recalls 25 - 49% of the time/requires cueing 50 - 75% of the time   Pain Pain Assessment Pain Assessment: No/denies pain  Therapy/Group: Individual Therapy  Lynzie Cliburn 08/30/2015, 4:20 PM

## 2015-08-30 NOTE — Patient Care Conference (Signed)
Inpatient RehabilitationTeam Conference and Plan of Care Update Date: 08/28/2015   Time: 2:45 PM    Patient Name: Lisa SnellenFaye K Chesmore      Medical Record Number: [id number]  Date of Birth: 05/24/1927 Sex: Female         Room/Bed: 4W18C/4W18C-01 Payor Info: Payor: MEDICARE / Plan: MEDICARE PART A AND B / Product Type: *No Product type* /    Admitting Diagnosis: TBI after a fall  Admit Date/Time:  08/22/2015  2:45 PM Admission Comments: No comment available   Primary Diagnosis:  TBI (traumatic brain injury) (HCC) Principal Problem: TBI (traumatic brain injury) [facility](HCC)  Patient Active Problem List   Diagnosis Date Noted  . Bacterial UTI 08/27/2015  . DVT, lower extremity (HCC) 08/27/2015  . TBI (traumatic brain injury) (HCC) 08/22/2015  . ICH (intracerebral hemorrhage) (HCC)   . Abscess   . Hypomagnesemia   . SDH (subdural hematoma) (HCC)   . Boil of upper extremity   . Pneumonia due to Haemophilus influenzae (HCC)   . Chronic systolic CHF (congestive heart failure) (HCC)   . Acute on chronic renal failure (HCC)   . Hypokalemia   . Bright red blood per rectum   . Dysphagia   . Pressure ulcer 08/16/2015  . Acute respiratory failure with hypoxemia (HCC)   . Hypernatremia   . Subdural hematoma (HCC) 08/03/2015  . Lumbar radiculopathy 06/06/2015  . Spinal stenosis, lumbar region, with neurogenic claudication 04/02/2015  . Degenerative lumbar disc 02/15/2015  . Lumbar post-laminectomy syndrome 02/15/2015  . Facet syndrome, lumbar 02/15/2015  . Sacroiliac joint dysfunction 02/15/2015    Expected Discharge Date: Expected Discharge Date: 09/12/15  Team Members Present: Physician leading conference: Dr. Faith RogueZachary Swartz Social Worker Present: Amada JupiterLucy Kinzy Weyers, LCSW Nurse Present: Chana Bodeeborah Sharp, RN PT Present: Teodoro Kilaitlin Penven-Crew, PT;Rebecca Evelina BucyVarner, PT OT Present: Callie FieldingKatie Pittman, OT;Ardis Rowanom Lanier, COTA SLP Present: Feliberto Gottronourtney Payne, SLP PPS Coordinator present : Tora DuckMarie Noel, RN, CRRN     Current  Status/Progress Goal Weekly Team Focus  Medical   TBI, slow to progress, hs confusion likely exacerbated by UTI  improve mentation, awareness  sleep, rx UTiI, improve hydraition and overall nutrition   Bowel/Bladder   foley incontinent at times of bowel LBM 12-4   manage bowel and bladder   discontinue foley   Swallow/Nutrition/ Hydration   Dys. 1 textures with honey-thick liquids, Mod A for use of swallow strategies  Supervision with least restrictive diet  Trials of upgraded liquids/textures, Repeat MBS   ADL's   functional transfers-tot A + 2, UB bathing/dressing-min A; LB bathing/dressing-max A; limited activity tolerance; standing balance-max A  min A overall  activity tolerance, standing balance, funcitonal transfers, BADL retraining   Mobility   squat pivot/stand pivot transfers with max A to +2A, supervision wheelchair mobility   min A transfers and short distance household ambulation  participation, initation, functional transfers, standing balance, initiating ambulation, strengthening, activity tolerance, pt/family education   Communication             Safety/Cognition/ Behavioral Observations  max assist will get up without assist mod- max stand pivot   min assist  reinforce safety needs work on attention , functional problem solving , awaress and recall   Pain   low back pain , headaches, nausea vicodin 1 po scheduled and every 6 hours  less than 4   monitor effectiveness of pain meds    Skin   buttocks red allevyn dressing applied skin to bilateral arms fragile   no new breakdown  monitor buttocks and turn every 2 hours as patient allows while in bed    Rehab Goals Patient on target to meet rehab goals: Yes *See Care Plan and progress notes for long and short-term goals.  Barriers to Discharge: age, neurological deficits    Possible Resolutions to Barriers:  supervision and assistance by family at discharge, ongoing NMR, rx medical issues    Discharge  Planning/Teaching Needs:  Plan for pt to d/c to her home with 24/7 care being arranged (she does have a LTC policy) by son.  Teaching to be scheduled closer to d/c.   Team Discussion:  Voicing better but different presentation today with ST - some s/s of aphasia?  Some changes in behavior as well - MD aware.  PT has had difficulty getting pt to participate.  Requires encouragement.  Min assist goals overall.  SW reports son is working on care at home.  Revisions to Treatment Plan:  NA   Continued Need for Acute Rehabilitation Level of Care: The patient requires daily medical management by a physician with specialized training in physical medicine and rehabilitation for the following conditions: Daily direction of a multidisciplinary physical rehabilitation program to ensure safe treatment while eliciting the highest outcome that is of practical value to the patient.: Yes Daily analysis of laboratory values and/or radiology reports with any subsequent need for medication adjustment of medical intervention for : Neurological problems;Other;Post surgical problems  Lisa [name] 08/30/2015, 4:27 PM

## 2015-08-30 NOTE — Progress Notes (Signed)
Occupational Therapy Weekly Progress Note  Patient Details  Name: JARVIS KNODEL MRN: [id number] Date of Birth: [birth year]  Beginning of progress report period: August 23, 2015 End of progress report period: August 30, 2015  Patient has met 1 of 4 short term goals.  Pt progress has been slow since admission.  Pt continues to require multiple rest breaks during BADL retraining with more than a reasonable amount of time to complete tasks.  Pt requires more than a reasonable amount of time to initiate tasks when requested.  Pt exhibits posterior lean with stand pivot transfers, requiring max A.  Pt requires mod A for sit<>stand from w/c and min A for standing balance when standing at sink to complete BADLs.  Pt continues to require max A for recall of recent events in hospital and recalling therapy session.  Patient continues to demonstrate the following deficits:  memory impairment, problem solving, postural control in standing, functional mobility with self care, decreased activity tolerance, occasional dizziness and therefore will continue to benefit from skilled OT intervention to enhance overall performance with BADL.  Patient progressing slowly, but gradually toward long term goals..  Continue plan of care. Pt's LTGs set at a min A level.  OT Short Term Goals Week 1:  OT Short Term Goal 1 (Week 1): Pt will be engaged in 10 minutes of functional self care tasks with requiring less than 1 rest break . OT Short Term Goal 1 - Progress (Week 1): Progressing toward goal OT Short Term Goal 2 (Week 1): Pt will perform LB dressing with max A in order to decrease level of assist needed with self care. OT Short Term Goal 2 - Progress (Week 1): Progressing toward goal OT Short Term Goal 3 (Week 1): Pt will perform UB dressing with min A in order to decrease level of assist needed with self care. OT Short Term Goal 3 - Progress (Week 1): Met OT Short Term Goal 4 (Week 1): Pt will perform toilet transfer  with mod A in order to increase I with functional transfers. OT Short Term Goal 4 - Progress (Week 1): Progressing toward goal Week 2:  OT Short Term Goal 1 (Week 2): Pt will engage in functional self care task for 10 mins or more with 1 rest break or less OT Short Term Goal 2 (Week 2): Pt will perform LB dressing tasks with max A sit<>stand OT Short Term Goal 3 (Week 2): Pt will perform toilet transfers with mod A stand pivot OT Short Term Goal 4 (Week 2): Pt will perform toileting tasks with mod A      Therapy Documentation Precautions:  Precautions Precautions: Fall Restrictions Weight Bearing Restrictions: No  See Function Navigator for Current Functional Status.   Therapy/Group: Individual Therapy  Leroy Libman 08/30/2015, 2:03 PM

## 2015-08-30 NOTE — Progress Notes (Signed)
Occupational Therapy Session Note  Patient Details  Name: Lisa [name] MRN: [id number] Date of Birth: Nov 08, 1926  Today's Date: 08/30/2015 OT Individual Time: 1000-1100 OT Individual Time Calculation (min): 60 min    Short Term Goals: Week 1:  OT Short Term Goal 1 (Week 1): Pt will be engaged in 10 minutes of functional self care tasks with requiring less than 1 rest break . OT Short Term Goal 1 - Progress (Week 1): Progressing toward goal OT Short Term Goal 2 (Week 1): Pt will perform LB dressing with max A in order to decrease level of assist needed with self care. OT Short Term Goal 2 - Progress (Week 1): Progressing toward goal OT Short Term Goal 3 (Week 1): Pt will perform UB dressing with min A in order to decrease level of assist needed with self care. OT Short Term Goal 3 - Progress (Week 1): Met OT Short Term Goal 4 (Week 1): Pt will perform toilet transfer with mod A in order to increase I with functional transfers. OT Short Term Goal 4 - Progress (Week 1): Progressing toward goal  Skilled Therapeutic Interventions/Progress Updates:    Pt resting in recliner upon arrival.  Pt agreeable to engaging in BADL retraining including bathing and dressing with sit<>stand from w/c at sink.  Pt requires max A for stand pivot transfers recliner<>w/c with significant posterior lean noted.  Pt stood at sink X 5 with BUE support and no posterior lean.  Pt stands approx 30 seconds before fatiguing and sitting down in w/c without notice.  Pt requires more than a reasonable amount of time to complete all tasks with multiple rest breaks between each segment of a task.  [name]  OT reminded pt that her son was still in [place] and would not return until 12/13.  Pt verbalized understanding but continued to state that her son be here today.  Focus on functional transfers, sit<>stand, standing balance, BADL retraining, task  initiation, activity tolerance, and safety awareness to increased independence with BADLs.  Therapy Documentation Precautions:  Precautions Precautions: Fall Restrictions Weight Bearing Restrictions: No Pain: Pain Assessment Pain Assessment: No/denies pain  See Function Navigator for Current Functional Status.   Therapy/Group: Individual Therapy  Lisa [name] 08/30/2015, 1:52 PM

## 2015-08-30 NOTE — Progress Notes (Signed)
Physical Therapy Weekly Progress Note  Patient Details  Name: Lisa [name] MRN: [id number] Date of Birth: December 01, 1926  Beginning of progress report period: August 23, 2015 End of progress report period: August 30, 2015  Today's Date: 08/30/2015 PT Individual Time: 0800-0900 and 1300-1400 PT Individual Time Calculation (min): 60 min and 60 min   Patient has met 2 of 4 short term goals.  Patient has made very slow progress this reporting period. She continues to require max A for stand pivot transfers and +2A for short distance gait < 15 ft. She propels wheelchair with supervision in controlled environment and performs bed mobility with min A. She is unable to maintain standing > 30 sec at a time with this therapist with max A. Patient demonstrates strong posterior lean due to fear of falling, fatigues quickly with all functional mobility, and requires more than reasonable time to complete tasks. Patient's progress has also been hindered by confusion as she frequently refuses to participate in standing/ambulation activities stating that she was already done them on that date.   Patient continues to demonstrate the following deficits: muscle weakness, muscle joint tightness, decreased cardiorespiratory endurance, decreased initiation, decreased attention, decreased awareness, decreased problem solving, decreased safety awareness, decreased memory, delayed processing, decreased standing balance, decreased postural control and decreased balance strategies and therefore will continue to benefit from skilled PT intervention to enhance overall performance with activity tolerance, balance, postural control, ability to compensate for deficits, functional use of  right upper extremity, right lower extremity, left upper extremity and left lower extremity, attention, awareness and coordination.  Patient progressing toward long term goals. Continue plan of care.  PT Short Term Goals Week 1:  PT Short Term Goal 1  (Week 1): Patient will perform bed mobility with min A. PT Short Term Goal 1 - Progress (Week 1): Met PT Short Term Goal 2 (Week 1): Patient will transfer wheelchair <> bed with mod A.  PT Short Term Goal 2 - Progress (Week 1): Not met PT Short Term Goal 3 (Week 1): Patient will initiate ambulation.  PT Short Term Goal 3 - Progress (Week 1): Met PT Short Term Goal 4 (Week 1): Patient will maintain standing balance x 2 min with assist of one person.  PT Short Term Goal 4 - Progress (Week 1): Not met Week 2:  PT Short Term Goal 1 (Week 2): Patient will perform bed mobility with supervision. PT Short Term Goal 2 (Week 2): Patient will transfer wheelchair <> bed with consistent mod A.  PT Short Term Goal 3 (Week 2): Patient will ambulate 10 feet with assist of 1 person and +2 for wheelchair follow. PT Short Term Goal 4 (Week 2): Patient will maintain standing balance x 1 min with assist of one person.   Skilled Therapeutic Interventions/Progress Updates:   Treatment 1: Session focused on sitting balance, functional transfers, and activity tolerance. Patient supine in bed, handoff from NT. Patient instructed to sit up on edge of bed but instead began tugging on waistband of pants and stating, "I can't get up with this belt around me," in reference to quick release belt for safety when patient sitting up in recliner/wheelchair in room. Patient re-oriented to situation and with increased time transferred to edge of bed using rail with min A to elevate trunk from flat bed and cues for technique. Patient sat edge of bed with supervision x 25 minutes to consume breakfast meal of Dys. 2 textures with thin liquids with min cues to initiate self-feeding  and no overt s/s of aspiration. Patient pulled a pillow over and rested on her side x 2 after swallowing during breakfast due to fatigue. Patient transferred to wheelchair via stand pivot with max A and cues for sequencing and set up in front of sink to brush hair  and brush teeth at wheelchair level. Patient with continued intermittent confusion throughout session and refused re-orientation to this therapist. Patient propelled wheelchair in controlled environment x 150 ft with increased time and supervision with occasional hand over hand assist for increased use LUE and efficient technique. Patient left sitting in wheelchair with quick release belt on and needs within reach.   Treatment 2: Patient sitting in recliner with RN to eat lunch meal. Patient finished Ensure drink with max cues for initiation and max encouragement. Performed stand pivot transfers recliner <> wheelchair with max A. Performed sit <> stand from wheelchair with cues for safe hand placement and technique with max A. Standing with BUE support on rails, performed step tap to 3" step using RLE then LLE progressed to stepping up/down one 3" step x 2 ascending forward and descending backward with max A for balance due to strong posterior lean in standing. Patient required prolonged seated rest breaks between standing and c/o feeling "swimmy headed" with first stand, vitals assessed and WFL. Gait training with max encouragement x 12 ft with B HHA and max A x 2. Patient performed sit <> stand x 2 from wheelchair for approx 5 sec and 20 sec with max encouragement to remain standing. Patient with continued confusion stating that she could hear son [name] in gym, when reminded that [name] was in [place] and will be back on Tuesday she continued to point to person in gym and state it was [place]. Patient pushed wheelchair back towards room x 50 ft with supervision and left sitting in recliner with quick release belt on and needs within reach.   Therapy Documentation Precautions:  Precautions Precautions: Fall Restrictions Weight Bearing Restrictions: No Pain: Pain Assessment Pain Assessment: No/denies pain   See Function Navigator for Current Functional Status.  Therapy/Group: Individual Therapy  Laretta Alstrom 08/30/2015, 11:37 AM

## 2015-08-30 NOTE — Progress Notes (Addendum)
[facility]     PROGRESS NOTE    Subjective/Complaints: Working with therapy. Not very motivated. Appears to be sleeping better.    ROS limited due to cognitive/communication deficits  Objective: Vital Signs: Blood pressure 128/47, pulse 71, temperature 97.8 F (36.6 C), temperature source Oral, resp. rate 16, height  (1.6 m), weight 62.5 kg (137 lb 12.6 oz), SpO2 96 %. Dg Swallowing Func-speech Pathology  08/29/2015  Objective Swallowing Evaluation:   Patient Details Name: HEDDY VIDANA MRN: [id number] Date of Birth: 1927-01-31 Today's Date: 08/29/2015 Time: SLP Start Time (ACUTE ONLY): 0900-SLP Stop Time (ACUTE ONLY): 0920 SLP Time Calculation (min) (ACUTE ONLY): 20 min Past Medical History: Past Medical History Diagnosis Date . Allergy  . Pelvis fracture (HCC)  . Wrist fracture, right  . Depression  . Spinal stenosis  . Osteoarthritis  . Hypertension  . Foot fracture    bilateral Past Surgical History: Past Surgical History Procedure Laterality Date . Back surgery N/A  . Breast lumpectomy N/A  HPI: 81 year old female presents after a fall in a parking lot sustaining Bil Frontal Cerebral Contusions.Pt was initially evaluated 11/16 with recommendation for NPO, but then began to have tachycardia and respiratory decline with encephalopathy, pulmonary edema, and cardiomyopathy. she was intubated 11/16-11/23.  pt with hx of HTN, Bil "Foot" fxs, Pelvic fxs, R Wrist fx, Back surgeries, and Depression. Admitted to CIR and has demonstrated functiona limprovements at bedside warranting an repeat objective assessment.  Subjective: pt sleepy Assessment / Plan / Recommendation CHL IP CLINICAL IMPRESSIONS 08/29/2015 Therapy Diagnosis Mild oral phase dysphagia Clinical Impression Patient with functional improvements as compared to previous objective assessment, but continues to demonstrate a mild oral, moderate cognitive based dysphagia.  Dysphagia is characterized by lingual  weakness, pumping leading and to piecemeal swallows and vallecular residue post swallow.  Recommend diet advancement to Dys. 2 textures and thin liquids with continued full supervision due to cognitive deficits and to maximize attention to and initiation of self-feeding in order to maximize safety.   Impact on safety and function Mild aspiration risk   CHL IP TREATMENT RECOMMENDATION 08/29/2015 Treatment Recommendations Therapy as outlined in treatment plan below   Prognosis 08/29/2015 Prognosis for Safe Diet Advancement Good Barriers to Reach Goals Cognitive deficits Barriers/Prognosis Comment -- CHL IP DIET RECOMMENDATION 08/29/2015 SLP Diet Recommendations Dysphagia 2 (Fine chop) solids;Thin liquid Liquid Administration via Cup;Straw Medication Administration Whole meds with liquid Compensations Small sips/bites;Effortful swallow;Multiple dry swallows after each bite/sip Postural Changes Seated upright at 90 degrees   CHL IP OTHER RECOMMENDATIONS 08/29/2015 Recommended Consults -- Oral Care Recommendations Oral care BID Other Recommendations --   CHL IP FOLLOW UP RECOMMENDATIONS 08/29/2015 Follow up Recommendations 24 hour supervision/assistance;Home health SLP   CHL IP FREQUENCY AND DURATION 08/29/2015 Speech Therapy Frequency (ACUTE ONLY) Other (Comment) Treatment Duration --      CHL IP ORAL PHASE 08/29/2015 Oral Phase Impaired Oral - Pudding Teaspoon -- Oral - Pudding Cup -- Oral - Honey Teaspoon -- Oral - Honey Cup Lingual pumping;Weak lingual manipulation;Reduced posterior propulsion;Piecemeal swallowing Oral - Nectar Teaspoon WFL Oral - Nectar Cup WFL Oral - Nectar Straw -- Oral - Thin Teaspoon -- Oral - Thin Cup WFL Oral - Thin Straw -- Oral - Puree Lingual pumping;Reduced posterior propulsion;Piecemeal swallowing;Weak lingual manipulation Oral - Mech Soft Weak lingual manipulation;Lingual pumping;Reduced posterior propulsion;Piecemeal swallowing Oral - Regular -- Oral - Multi-Consistency -- Oral - Pill -- Oral  Phase - Comment --  CHL IP  PHARYNGEAL PHASE 08/29/2015 Pharyngeal Phase Impaired Pharyngeal- Pudding Teaspoon -- Pharyngeal -- Pharyngeal- Pudding Cup -- Pharyngeal -- Pharyngeal- Honey Teaspoon -- Pharyngeal -- Pharyngeal- Honey Cup Delayed swallow initiation-vallecula;Pharyngeal residue - valleculae Pharyngeal -- Pharyngeal- Nectar Teaspoon NT Pharyngeal -- Pharyngeal- Nectar Cup Delayed swallow initiation-vallecula Pharyngeal -- Pharyngeal- Nectar Straw -- Pharyngeal -- Pharyngeal- Thin Teaspoon -- Pharyngeal -- Pharyngeal- Thin Cup -- Pharyngeal -- Pharyngeal- Thin Straw -- Pharyngeal -- Pharyngeal- Puree Delayed swallow initiation-vallecula;Pharyngeal residue - valleculae Pharyngeal -- Pharyngeal- Mechanical Soft Delayed swallow initiation-vallecula;Pharyngeal residue - valleculae Pharyngeal -- Pharyngeal- Regular -- Pharyngeal -- Pharyngeal- Multi-consistency -- Pharyngeal -- Pharyngeal- Pill -- Pharyngeal -- Pharyngeal Comment extra time to allow for second swallow effectively reducced oral and pharyngeal residue   CHL IP CERVICAL ESOPHAGEAL PHASE 08/29/2015 Cervical Esophageal Phase WFL Pudding Teaspoon -- Pudding Cup -- Honey Teaspoon -- Honey Cup -- Nectar Teaspoon -- Nectar Cup -- Nectar Straw -- Thin Teaspoon -- Thin Cup -- Thin Straw -- Puree -- Mechanical Soft -- Regular -- Multi-consistency -- Pill -- Cervical Esophageal Comment -- Charlane Ferretti., CCC-SLP [phone] BOWIE,MELISSA 08/29/2015, 10:04 AM              No results for input(s): WBC, HGB, HCT, PLT in the last 72 hours.  Recent Labs  08/30/15 0656  NA 137  K 3.9  CL 101  GLUCOSE 82  BUN 15  CREATININE 1.28*  CALCIUM 8.7*   CBG (last 3)   Recent Labs  08/29/15 1654 08/29/15 2119 08/30/15 0625  GLUCAP 76 122* 83    Wt Readings from Last 3 Encounters:  08/30/15 62.5 kg (137 lb 12.6 oz)  08/22/15 67 kg (147 lb 11.3 oz)  08/03/15 65.772 kg (145 lb)    Physical Exam:  Constitutional: She is oriented to person, place,  and time. She appears well-developed and well-nourished.     HENT:  Head: Normocephalic and atraumatic.  Right Ear: No decreased hearing is noted.  Left Ear: No decreased hearing is noted.  Mouth/Throat: Mucous membranes still dry. Debris on teeth as well..  Ecchymosis left forehead  resolved Eyes: Conjunctivae are normal. Pupils are equal, round, and reactive to light.   Neck: Normal range of motion. Neck supple.  Cardiovascular: Normal rate and regular rhythm.  Respiratory: Effort normal and breath sounds normal. No respiratory distress. She has no wheezes.  GI: Soft. Bowel sounds are normal. She exhibits no distension. There is no tenderness. There is no rebound.  Musculoskeletal: low back tender with truncal movement and palpation  Neurological: She is alert and oriented to person, place, and time. Left facial weakness. Limited insight and awareness. Skin: Skin is warm and dry.  Multiple scabbed areas bilateral feet healing    Motor 4/5 UE's. LE's 2-3/5 at hips/knees partly due to pain. 4/5 ankles     Sensation intact to light touch in bilateral upper and lower limbs Skin: indurated area right forearm with dry dressing  Assessment/Plan: 1. Weakness, balance and cognitive deficits secondary to traumatic brain injury which require 3  hours per day of interdisciplinary therapy in a comprehensive inpatient rehab setting. (reduced to allow for fatigue,poor activity tolerance thus far) Physiatrist is providing close team supervision and 24 hour management of active medical problems listed below. Physiatrist and rehab team continue to assess barriers to discharge/monitor patient progress toward functional and medical goals.  Function:  Bathing Bathing position   Position: Wheelchair/chair at sink  Bathing parts Body parts bathed by patient: Right arm, Left arm, Chest, Abdomen, Right  upper leg, Left upper leg Body parts bathed by helper: Front perineal area, Buttocks, Right  lower leg, Left lower leg  Bathing assist Assist Level: 2 helpers      Upper Body Dressing/Undressing Upper body dressing   What is the patient wearing?: Pull over shirt/dress, Bra Bra - Perfomed by patient: Thread/unthread right bra strap, Thread/unthread left bra strap Bra - Perfomed by helper: Hook/unhook bra (pull down sports bra) Pull over shirt/dress - Perfomed by patient: Thread/unthread right sleeve, Thread/unthread left sleeve, Put head through opening Pull over shirt/dress - Perfomed by helper: Pull shirt over trunk        Upper body assist Assist Level: Touching or steadying assistance(Pt > 75%)   Set up : To obtain clothing/put away  Lower Body Dressing/Undressing Lower body dressing   What is the patient wearing?: Pants, Socks, Shoes   Underwear - Performed by helper: Thread/unthread right underwear leg, Thread/unthread left underwear leg, Pull underwear up/down   Pants- Performed by helper: Thread/unthread right pants leg, Thread/unthread left pants leg, Pull pants up/down, Fasten/unfasten pants   Non-skid slipper socks- Performed by helper: Don/doff right sock, Don/doff left sock       Shoes - Performed by helper: Don/doff right shoe, Don/doff left shoe, Fasten right, Fasten left          Lower body assist Assist for lower body dressing: 2 Designer, multimediaHelpers      Toileting Toileting Toileting activity did not occur: No continent bowel/bladder event   Toileting steps completed by helper: Adjust clothing prior to toileting, Performs perineal hygiene, Adjust clothing after toileting Toileting Assistive Devices: Grab bar or rail  Toileting assist Assist level: Two helpers   Transfers Chair/bed transfer   Chair/bed transfer method: Stand pivot Chair/bed transfer assist level: Maximal assist (Pt 25 - 49%/lift and lower) Chair/bed transfer assistive device: Armrests, Bedrails     Locomotion Ambulation Ambulation activity did not occur: Safety/medical concerns   Max  distance: 8 Assist level: 2 helpers (wheelchair follow for safety)   Wheelchair   Type: Manual Max wheelchair distance: 150 Assist Level: Supervision or verbal cues  Cognition Comprehension Comprehension assist level: Understands basic 50 - 74% of the time/ requires cueing 25 - 49% of the time  Expression Expression assist level: Expresses basic 50 - 74% of the time/requires cueing 25 - 49% of the time. Needs to repeat parts of sentences.  Social Interaction Social Interaction assist level: Interacts appropriately 25 - 49% of time - Needs frequent redirection.  Problem Solving Problem solving assist level: Solves basic 25 - 49% of the time - needs direction more than half the time to initiate, plan or complete simple activities  Memory Memory assist level: Recognizes or recalls 25 - 49% of the time/requires cueing 50 - 75% of the time   Medical Problem List and Plan: 1. 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 DVT Prophylaxis/Anticoagulation: bilateral ?distal femoral DVT's---eliquis  -re-doppler Friday 3. Chronic back pain/Pain Management: Used hydrocodone tid due to back and LE pain. [facility]   -scheduled hydrocodone to help with activity tolerance     4. Mood: LCSW to follow for evaluation and support.  5. Neuropsych: This patient is not fully capable of making decisions on her own behalf.  -suspect confusion was related to urosepsis  -tends to sundown.   -safety plan to prevent fall.   -added low dose risperdal  QHS for confusion and to encourage sleep    6. Skin/Wound Care: Routine pressure relief measures. Maintain adequate nutrition and hyrdration status.  7. Fluids/Electrolytes/Nutrition: Continue Dysphagia 1, honey liquids as swallow function shows minimal improvement. IVF at night as possible given NSL  situation and push fluids during the day.   8. Acute on chronic renal failure: continue to push po fluids--- labs personally reviewed and near normal today---Cr. 1.28, BUN 15 9. HTN: Monitor tid for now.  10. Acute on chronic Iron deficiency anemia: Added iron supplement. hgb 8.9 today,  i would assume some of this is dilutional in addtion to #13 11. Aspiration PNA/H flu PNA: Has completed antibiotic course.  12. 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 Continue amiodarone daily and lopressor bid. To follow up with [facility] after discharge.  13. Rectal bleeding: Has resolved--without  recurrence. Will need follow up evaluation after discharge.  14. Depression: Used to take Celexa and Abilify PTA with good results per family.  15. Urinary retention?: Foley replaced due to poor UOP---continue for now.  -100k E Coli---  keflex  tid x 7 days 16. Cellulitis right forearm: likely small hematoma---continue local care---area improved   LOS (Days) 8 A FACE TO FACE EVALUATION WAS PERFORMED  SWARTZ,ZACHARY T 08/30/2015 10:23 AM

## 2015-08-31 ENCOUNTER — Inpatient Hospital Stay (HOSPITAL_COMMUNITY): Payer: Medicare Other | Admitting: Physical Therapy

## 2015-08-31 ENCOUNTER — Inpatient Hospital Stay (HOSPITAL_COMMUNITY): Payer: Medicare Other

## 2015-08-31 ENCOUNTER — Inpatient Hospital Stay (HOSPITAL_COMMUNITY): Payer: Medicare Other | Admitting: Speech Pathology

## 2015-08-31 DIAGNOSIS — I82402 Acute embolism and thrombosis of unspecified deep veins of left lower extremity: Secondary | ICD-10-CM

## 2015-08-31 DIAGNOSIS — F4489 Other dissociative and conversion disorders: Secondary | ICD-10-CM

## 2015-08-31 LAB — GLUCOSE, CAPILLARY: Glucose-Capillary: 96 mg/dL (ref 65–99)

## 2015-08-31 MED ORDER — ARIPIPRAZOLE 5 MG PO TABS
5.0000 mg | ORAL_TABLET | Freq: Every day | ORAL | Status: DC
  Administered 2015-08-31 – 2015-09-03 (×4): 5 mg via ORAL
  Filled 2015-08-31 (×5): qty 1

## 2015-08-31 NOTE — Consult Note (Signed)
NEUROCOGNITIVE TESTING - CONFIDENTIAL [facility]   [name] is an 81 year old, left-handed woman, who was seen for a brief neuropsychological assessment to evaluate her cognitive and emotional functioning in the setting of traumatic brain injury.  According to her medical record, she was admitted on 08/03/15 after a fall in the parking lot with subsequent acute traumatic subarachnoid hemorrhage along frontal convexities and acute right subdural hematoma with confusion and waxing and waning of mental status.  Follow-up CT of the head demonstrated delayed development of bilateral frontal intracranial hemorrhage with complication from complex tachycardia with cardio-pulmonary arrest.  She was intubated on 08/08/15 and ultimately extubated on 08/15/15.  Cranial CT from 08/19/15 demonstrated improvement with near resolution of subarachnoid hemorrhage and bifrontal parenchymal hemorrhage as well as subtle new 3mm midline shift.  Her overall condition had improved enough that she was deemed appropriate for inpatient rehabilitation.  According to staff members, Lisa [name]'s son questioned whether she would be safe to discharge home with 24-hour hired assistance.  Therefore, a neuropsychological consult was requested to see if from a cognitive standpoint, discharging home under those conditions would be appropriate.    PROCEDURES: [2 units of 16109 on 08/30/15]  The following tests were performed during today's visit: Repeatable Battery for the Assessment of Neuropsychological Status (RBANS, form A), and the Geriatric Depression Scale (short form).  Test results are as follows:   RBANS Indices Scaled Score Percentile Description  Immediate Memory  53 < 1 Profoundly Impaired  Visuospatial/Constructional 56 < 1 Profoundly Impaired  Language 82 9 Below Average  Attention 82 12 Below Average  Delayed Memory 48 < 1 Profoundly Impaired  Total Score 54 < 1 Profoundly Impaired    RBANS Subtests Raw Score Percentile Description  List Learning 10 < 1 Profoundly Impaired  Story Memory 6 1 Profoundly Impaired  Figure Copy 12 < 1 Profoundly Impaired  Line Orientation 0 < 1 Profoundly Impaired  Picture Naming 10 82 Above Average  Semantic Fluency 6 < 1 Profoundly Impaired  Digit Span 11 79 Above Average  Coding 0 < 1 Profoundly Impaired  List Recall 0 < 1 Profoundly Impaired  List Recognition 9 < 1 Profoundly Impaired  Story Recall 0 < 1 Profoundly Impaired  Figure recall 6 9 Below Average   Geriatric Depression Scale (short form) Raw Score = 8/15 Description = Mild Depression   Lisa [name]'s performances revealed marked impairments across cognitive domains with relative sparing of confrontation naming, simple attention, and incidental visual recall and were at the level of a Major Neurocognitive Disorder.  However, it is noteworthy that she reported significant fatigue at the time of the evaluation, which seemed to adversely impact her performances.  For example, on the subtest of complex attention using psychomotor speed, she readily understood complex task instructions and successfully completed the sample items without difficulty.  However, when prompted to begin the test items, she refused, stating that she did not want to.  She also repeatedly stated that she wanted to "get in the bed" and at one point remarked, "If I don't get in that bed, I'm going to scream!"  Owing to her reports of fatigue, the evaluation was discontinued early and not all planned measures were administered.  Given possible impact of extreme fatigue on test scores, they should be interpreted with caution, as they may represent an underestimation of her actual cognitive abilities at this time.  Despite premature discontinuation of the evaluation, there was enough information gathered from the  tasks that she completed in order to answer the referral question.    IMPRESSIONS AND RECOMMENDATIONS:   Despite evidence of multiple impairments, Lisa [name]'s comprehension was intact, even when directions were complex.  In addition, she demonstrated above average simple attention and intact language skills (when she was not being timed).  Therefore, there does not appear to be a cognitive reason why it would not be safe for her to discharge home with full-time physical assistance.  Her family should keep in mind, however, that she may benefit from social interaction that is possible in a nursing facility.  Individuals interacting with Lisa [name] should be aware that she had significant difficulty encoding novel information and did not benefit from recognition cueing.  Therefore, they should write instructions and important information down when possible and should not expect her to recall information when she is given cues.  She will likely not recall instructions that were given to her in prior therapy sessions during subsequent ones; information should be presented as though it is novel even if it had been discussed previously.    From an emotional standpoint, Lisa [name] endorsed symptoms consistent with mild clinical depression at this time.  However, upon questioning, it seems that her depression is longstanding; she described it beginning 7 years ago following the deaths of multiple family members and friends.  She purportedly continues to see a psychologist for management of mood symptoms and she said that her anti-depressant medications are helpful.  Although she noted that she worries about the future to some extent and feels hopelessness about life, in general, she denied worsening of depressive symptoms since her fall and she said that she has significant social support.  Therefore, no changes in management of mood symptoms are likely indicated at this time.  A follow-up session with the neuropsychologist could be requested should Lisa [name]'s care team feel that it would be beneficial during her stay on the  inpatient rehabilitation unit.     Leavy CellaKaren Lonn Im, Psy.D.  Clinical Neuropsychologist

## 2015-08-31 NOTE — Progress Notes (Signed)
[place]     PROGRESS NOTE    Subjective/Complaints: Pt with confusion again last night. Awake this morning. Does not appear to be in distress.     ROS limited due to cognitive/communication deficits  Objective: Vital Signs: Blood pressure 120/47, pulse 74, temperature 97.6 F (36.4 C), temperature source Oral, resp. rate 17, height  (1.6 m), weight 63.3 kg (139 lb 8.8 oz), SpO2 97 %. Ct Head Wo Contrast  08/30/2015  CLINICAL DATA:  Fatigue, confusion EXAM: CT HEAD WITHOUT CONTRAST TECHNIQUE: Contiguous axial images were obtained from the base of the skull through the vertex without intravenous contrast. COMPARISON:  08/19/2015 FINDINGS: Calvarium is intact. Subtle opacified left mastoid air cells are again seen, stable. Air-fluid level left maxillary sinus stable. Diffuse atrophy with white matter low attenuation stable. Particular low-attenuation left frontal lobe and to a lesser degree right frontal lobe consistent with known bifrontal edema, improving. Improving bifrontal contusions only minimally visible currently. Mild approximately 2 mm of midline shift toward the left, stable to slightly improved. Low-attenuation subdural collection on the right no change from prior study. No evidence of acute intracranial hemorrhage or extra-axial collection. IMPRESSION: Continued improvement in bilateral contusions and associated edema with anticipated evolution of right subdural collection. No acute findings. Electronically Signed   By: Esperanza Heir M.D.   On: 08/30/2015 17:40   Dg Swallowing Func-speech Pathology  08/29/2015  Objective Swallowing Evaluation:   Patient Details Name: Lisa [name] MRN: [id number] Date of Birth: 1927-08-13 Today's Date: 08/29/2015 Time: SLP Start Time (ACUTE ONLY): 0900-SLP Stop Time (ACUTE ONLY): 0920 SLP Time Calculation (min) (ACUTE ONLY): 20 min Past Medical History: Past Medical History Diagnosis Date . Allergy  . Pelvis  fracture (HCC)  . Wrist fracture, right  . Depression  . Spinal stenosis  . Osteoarthritis  . Hypertension  . Foot fracture    bilateral Past Surgical History: Past Surgical History Procedure Laterality Date . Back surgery N/A  . Breast lumpectomy N/A  HPI: 81 year old female presents after a fall in a parking lot sustaining Bil Frontal Cerebral Contusions.Pt was initially evaluated 11/16 with recommendation for NPO, but then began to have tachycardia and respiratory decline with encephalopathy, pulmonary edema, and cardiomyopathy. she was intubated 11/16-11/23.  pt with hx of HTN, Bil "Foot" fxs, Pelvic fxs, R Wrist fx, Back surgeries, and Depression. Admitted to CIR and has demonstrated functiona limprovements at bedside warranting an repeat objective assessment.  Subjective: pt sleepy Assessment / Plan / Recommendation CHL IP CLINICAL IMPRESSIONS 08/29/2015 Therapy Diagnosis Mild oral phase dysphagia Clinical Impression Patient with functional improvements as compared to previous objective assessment, but continues to demonstrate a mild oral, moderate cognitive based dysphagia.  Dysphagia is characterized by lingual weakness, pumping leading and to piecemeal swallows and vallecular residue post swallow.  Recommend diet advancement to Dys. 2 textures and thin liquids with continued full supervision due to cognitive deficits and to maximize attention to and initiation of self-feeding in order to maximize safety.   Impact on safety and function Mild aspiration risk   CHL IP TREATMENT RECOMMENDATION 08/29/2015 Treatment Recommendations Therapy as outlined in treatment plan below   Prognosis 08/29/2015 Prognosis for Safe Diet Advancement Good Barriers to Reach Goals Cognitive deficits Barriers/Prognosis Comment -- CHL IP DIET RECOMMENDATION 08/29/2015 SLP Diet Recommendations Dysphagia 2 (Fine chop) solids;Thin liquid Liquid Administration via Cup;Straw Medication Administration Whole meds with liquid Compensations  Small sips/bites;Effortful swallow;Multiple dry swallows after each bite/sip Postural Changes Seated  upright at 90 degrees   CHL IP OTHER RECOMMENDATIONS 08/29/2015 Recommended Consults -- Oral Care Recommendations Oral care BID Other Recommendations --   CHL IP FOLLOW UP RECOMMENDATIONS 08/29/2015 Follow up Recommendations 24 hour supervision/assistance;Home health SLP   CHL IP FREQUENCY AND DURATION 08/29/2015 Speech Therapy Frequency (ACUTE ONLY) Other (Comment) Treatment Duration --      CHL IP ORAL PHASE 08/29/2015 Oral Phase Impaired Oral - Pudding Teaspoon -- Oral - Pudding Cup -- Oral - Honey Teaspoon -- Oral - Honey Cup Lingual pumping;Weak lingual manipulation;Reduced posterior propulsion;Piecemeal swallowing Oral - Nectar Teaspoon WFL Oral - Nectar Cup WFL Oral - Nectar Straw -- Oral - Thin Teaspoon -- Oral - Thin Cup WFL Oral - Thin Straw -- Oral - Puree Lingual pumping;Reduced posterior propulsion;Piecemeal swallowing;Weak lingual manipulation Oral - Mech Soft Weak lingual manipulation;Lingual pumping;Reduced posterior propulsion;Piecemeal swallowing Oral - Regular -- Oral - Multi-Consistency -- Oral - Pill -- Oral Phase - Comment --  CHL IP PHARYNGEAL PHASE 08/29/2015 Pharyngeal Phase Impaired Pharyngeal- Pudding Teaspoon -- Pharyngeal -- Pharyngeal- Pudding Cup -- Pharyngeal -- Pharyngeal- Honey Teaspoon -- Pharyngeal -- Pharyngeal- Honey Cup Delayed swallow initiation-vallecula;Pharyngeal residue - valleculae Pharyngeal -- Pharyngeal- Nectar Teaspoon NT Pharyngeal -- Pharyngeal- Nectar Cup Delayed swallow initiation-vallecula Pharyngeal -- Pharyngeal- Nectar Straw -- Pharyngeal -- Pharyngeal- Thin Teaspoon -- Pharyngeal -- Pharyngeal- Thin Cup -- Pharyngeal -- Pharyngeal- Thin Straw -- Pharyngeal -- Pharyngeal- Puree Delayed swallow initiation-vallecula;Pharyngeal residue - valleculae Pharyngeal -- Pharyngeal- Mechanical Soft Delayed swallow initiation-vallecula;Pharyngeal residue - valleculae Pharyngeal  -- Pharyngeal- Regular -- Pharyngeal -- Pharyngeal- Multi-consistency -- Pharyngeal -- Pharyngeal- Pill -- Pharyngeal -- Pharyngeal Comment extra time to allow for second swallow effectively reducced oral and pharyngeal residue   CHL IP CERVICAL ESOPHAGEAL PHASE 08/29/2015 Cervical Esophageal Phase WFL Pudding Teaspoon -- Pudding Cup -- Honey Teaspoon -- Honey Cup -- Nectar Teaspoon -- Nectar Cup -- Nectar Straw -- Thin Teaspoon -- Thin Cup -- Thin Straw -- Puree -- Mechanical Soft -- Regular -- Multi-consistency -- Pill -- Cervical Esophageal Comment -- Fae Pippin, M.A., CCC-SLP [phone] BOWIE,MELISSA 08/29/2015, 10:04 AM               Recent Labs  08/30/15 1640  WBC 6.6  HGB 10.8*  HCT 35.5*  PLT 288    Recent Labs  08/30/15 0656  NA 137  K 3.9  CL 101  GLUCOSE 82  BUN 15  CREATININE 1.28*  CALCIUM 8.7*   CBG (last 3)   Recent Labs  08/29/15 2119 08/30/15 0625 08/30/15 1134  GLUCAP 122* 83 107*    Wt Readings from Last 3 Encounters:  08/31/15 63.3 kg (139 lb 8.8 oz)  08/22/15 67 kg (147 lb 11.3 oz)  08/03/15 65.772 kg (145 lb)    Physical Exam:  Constitutional: She is oriented to person, place, and time. She appears well-developed and well-nourished.     HENT:  Head: Normocephalic and atraumatic.  Right Ear: No decreased hearing is noted.  Left Ear: No decreased hearing is noted.  Mouth/Throat: Mucous membranes still dry. Debris on teeth as well..  Ecchymosis left forehead  resolved Eyes: Conjunctivae are normal. Pupils are equal, round, and reactive to light.   Neck: Normal range of motion. Neck supple.  Cardiovascular: Normal rate and regular rhythm.  Respiratory: Effort normal and breath sounds normal. No respiratory distress. She has no wheezes.  GI: Soft. Bowel sounds are normal. She exhibits no distension. There is no tenderness. There is no rebound.  Musculoskeletal: low back tender  with truncal movement and palpation  Neurological: She is  fairly alert and oriented to person, otherwise confused.   Left facial weakness. Limited insight and awareness. Skin: Skin is warm and dry.  Multiple scabbed areas bilateral feet healing    Motor 4/5 UE's. LE's 2-3/5 at hips/knees partly due to pain. 4/5 ankles     Sensation intact to light touch in bilateral upper and lower limbs Skin: indurated area right forearm with dry dressing  Assessment/Plan: 1. Weakness, balance and cognitive deficits secondary to traumatic brain injury which require 3  hours per day of interdisciplinary therapy in a comprehensive inpatient rehab setting. (reduced to allow for fatigue,poor activity tolerance thus far) Physiatrist is providing close team supervision and 24 hour management of active medical problems listed below. Physiatrist and rehab team continue to assess barriers to discharge/monitor patient progress toward functional and medical goals.  Function:  Bathing Bathing position   Position: Wheelchair/chair at sink  Bathing parts Body parts bathed by patient: Right arm, Left arm, Chest, Abdomen, Right upper leg, Left upper leg Body parts bathed by helper: Front perineal area, Right lower leg, Buttocks, Left lower leg, Back  Bathing assist Assist Level: 2 helpers      Upper Body Dressing/Undressing Upper body dressing   What is the patient wearing?: Bra, Pull over shirt/dress Bra - Perfomed by patient: Thread/unthread right bra strap, Thread/unthread left bra strap Bra - Perfomed by helper: Hook/unhook bra (pull down sports bra) Pull over shirt/dress - Perfomed by patient: Thread/unthread right sleeve, Thread/unthread left sleeve, Put head through opening, Pull shirt over trunk Pull over shirt/dress - Perfomed by helper: Pull shirt over trunk        Upper body assist Assist Level: Touching or steadying assistance(Pt > 75%)   Set up : To obtain clothing/put away  Lower Body Dressing/Undressing Lower body dressing   What is the patient  wearing?: Pants, Socks, Underwear   Underwear - Performed by helper: Thread/unthread right underwear leg, Thread/unthread left underwear leg, Pull underwear up/down Pants- Performed by patient: Thread/unthread right pants leg, Thread/unthread left pants leg Pants- Performed by helper: Pull pants up/down   Non-skid slipper socks- Performed by helper: Don/doff right sock, Don/doff left sock       Shoes - Performed by helper: Don/doff right shoe, Don/doff left shoe, Fasten right, Fasten left          Lower body assist Assist for lower body dressing: 2 Designer, multimedia activity did not occur: No continent bowel/bladder event   Toileting steps completed by helper: Adjust clothing prior to toileting, Performs perineal hygiene, Adjust clothing after toileting Toileting Assistive Devices: Grab bar or rail  Toileting assist Assist level: Two helpers   Transfers Chair/bed transfer   Chair/bed transfer method: Stand pivot Chair/bed transfer assist level: Maximal assist (Pt 25 - 49%/lift and lower) Chair/bed transfer assistive device: Armrests, Bedrails     Locomotion Ambulation Ambulation activity did not occur: Safety/medical concerns   Max distance: 12 Assist level: 2 helpers   Wheelchair   Type: Manual Max wheelchair distance: 150 Assist Level: Supervision or verbal cues  Cognition Comprehension Comprehension assist level: Understands basic 50 - 74% of the time/ requires cueing 25 - 49% of the time  Expression Expression assist level: Expresses basic 50 - 74% of the time/requires cueing 25 - 49% of the time. Needs to repeat parts of sentences.  Social Interaction Social Interaction assist level: Interacts appropriately 25 - 49% of time -  Needs frequent redirection.  Problem Solving Problem solving assist level: Solves basic 25 - 49% of the time - needs direction more than half the time to initiate, plan or complete simple activities  Memory Memory  assist level: Recognizes or recalls 25 - 49% of the time/requires cueing 50 - 75% of the time   Medical Problem List and Plan: 1. 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 DVT Prophylaxis/Anticoagulation: bilateral ?distal femoral DVT's---eliquis  -re-doppler today 3. Chronic back pain/Pain Management: Used hydrocodone tid due to back and LE pain. [facility]   -scheduled hydrocodone to help with activity tolerance     4. Mood: LCSW to follow for evaluation and support.  5. Neuropsych: This patient is not fully capable of making decisions on her own behalf.  -suspect confusion  related to urosepsis and BI in 81 yo patient  -confusion worse at night.   -safety plan to prevent fall.   -will add low dose abilify QHS (per home regimen)for confusion and to encourage sleep (in place of risperdal)  -labs and head CT all personally reviewed and are essentially unremarkable----CT showing improvement  6. Skin/Wound Care: Routine pressure relief measures. Maintain adequate nutrition and hyrdration status.  7. Fluids/Electrolytes/Nutrition: Continue Dysphagia 1, honey liquids as swallow function shows minimal improvement. IVF at night as possible given NSL situation and push fluids during the day.   8. Acute on chronic renal failure: continue to push po fluids--- recheck over weekend 9. HTN: Monitor tid for now.  10. Acute on chronic Iron deficiency anemia: continue iron supplement.  11. Aspiration PNA/H flu PNA: Has completed antibiotic course.  12. 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 Continue amiodarone daily and lopressor bid. To follow up with [facility] after discharge.  13. Rectal bleeding: Has resolved--without  recurrence. Will need follow up evaluation after discharge.  14. Depression: Used to  take Celexa and Abilify--resume abilify today  15. Urinary retention?: Foley replaced due to poor UOP---continue for now.  -100k E Coli---  keflex 250mg  tid x 7 days 16. Cellulitis right forearm: likely small hematoma---continue local care---area improved   LOS (Days) 9 A FACE TO FACE EVALUATION WAS PERFORMED  SWARTZ,ZACHARY T 08/31/2015 9:25 AM

## 2015-08-31 NOTE — Progress Notes (Signed)
Physical Therapy Session Note  Patient Details  Name: Lisa SnellenFaye K Stelly MRN: [id number] Date of Birth: 12/09/1926  Today's Date: 08/31/2015 PT Individual Time: 1300-1400 PT Individual Time Calculation (min): 60 min   Short Term Goals: Week 2:  PT Short Term Goal 1 (Week 2): Patient will perform bed mobility with supervision. PT Short Term Goal 2 (Week 2): Patient will transfer wheelchair <> bed with consistent mod A.  PT Short Term Goal 3 (Week 2): Patient will ambulate 10 feet with assist of 1 person and +2 for wheelchair follow. PT Short Term Goal 4 (Week 2): Patient will maintain standing balance x 1 min with assist of one person.   Skilled Therapeutic Interventions/Progress Updates:   Session focused on functional transfers, wheelchair propulsion, gait, attention, problem solving, and activity tolerance. Patient in bed with visitor departing. Donned pants in bed with rolling to L and R with supervision using rails and total A for LB dressing. Patient appeared to stare off into space when instructed to sit up, when therapist repeated command patient would continue to state, "I know honey, I'm just thinking." With increased time and max cues, patient eventually transferred supine > sit with use of rail and supervision with mod verbal/visual cues for technique. Performed stand pivot transfer with max A and max multimodal cues for sequencing and technique with patient demonstrating posterior lean in standing and decreased ability to step her feet around to chair. Patient propelled wheelchair using BUE x 150 ft with supervision and cues for steering to avoid obstacles on the L with increased time. Patient initially agreeable to ambulation and performed sit <> stand to RW with max A, but then became resistant and pushed backwards against therapist and demanded to sit down. Completed 2 simple pipetree puzzles while seated in wheelchair with max multimodal cues for comprehension of sequencing and correcting  errors with greatly increased time. Patient required mod multimodal cues to attend to task in distracting environment of therapy gym. Patient also stated that this task was too tiring. Primary OT arrived and assisted in encouraging patient to participate in ambulation this date. Eventually she performed sit <> stand with max A and ambulated x 10 ft using RW with therapist providing max A and managing RW and +2A for close wheelchair follow as patient sat suddenly when fatigued. Patient with somewhat improved postural control and decreased posterior lean during ambulation with continued small stride length and very narrow BOS. Patient requested to return to bed and transferred sit > supine with supervision and use of rail. Patient left semi reclined in bed with needs within reach and NT present.   Therapy Documentation Precautions:  Precautions Precautions: Fall Restrictions Weight Bearing Restrictions: No Pain: Pain Assessment Pain Assessment: No/denies pain   See Function Navigator for Current Functional Status.   Therapy/Group: Individual Therapy  Kerney ElbeVarner, Zykia Walla A 08/31/2015, 2:22 PM

## 2015-08-31 NOTE — Progress Notes (Signed)
Speech Language Pathology Daily Session Notes  Patient Details  Name: Lisa [name] MRN: [id number] Date of Birth: 1927/02/28  Today's Date: 08/31/2015  Session 1: SLP Individual Time: 1100-1200 SLP Individual Time Calculation (min): 60 min   Session 2: SLP Individual Time: 2440-1027 SLP Individual Time Calculation (min): 25 min  Short Term Goals: Week 2: SLP Short Term Goal 1 (Week 2): Patient will consume current diet with minimal overt s/s of aspiration with supervision verbal cues for use of swallow strategies.  SLP Short Term Goal 2 (Week 2): Patient will sustain attention to a functional task for 5 minutes with Mod A verbal cues for redirection.  SLP Short Term Goal 3 (Week 2): Patient will identify 2 physical and 2 cognitive deficits with Mod A question and semantic cues.  SLP Short Term Goal 4 (Week 2): Patient will initiate functional tasks with no more than 1 visual and 1 verbal cue in 75% of opportunities.  SLP Short Term Goal 5 (Week 2): Patient will demonstrate functional problem solving for basic and familiar tasks with Mod A verbal cues.   Skilled Therapeutic Interventions:  Session 1: Skilled treatment session focused on cognitive goals.  Upon arrival, patient was awake while supine in bed but appeared fatigued. However, patient's overall cognitive function appeared improved today compared to yesterday's session.  Patient was independently oriented X 4 and recalled some events from previous therapy session with Mod A question cues.  Patient reported she had a headache and was able to utilize the call bell with Min A question cues to request pain medication. Patient engaged in a functional conversation in regards to basic biographical information with Mod I. Patient did not demonstrate any paraphasias with minimal language of confusion.  Patient declined lunch meal at this time but consumed thin liquids via straw without overt s/s of aspiration.  Patient left semi-reclined in bed  with all needs within reach and bed alarm on. Continue with current plan.    Session 2: Skilled treatment session focused on cognitive goals. Upon arrival, patient was awake while supine in bed. SLP facilitated session by providing Max A question cues for recall of events from previous therapy session. SLP also facilitated session by providing extra time and Max A verbal and visual cues for patient to self-monitor and correct errors during a functional and familiar task. Patinet independently initiated the task and sustained attention to the task for ~2 minutes with Mod I. Patient appeared fatigued and reported that she was very tired but no one understood this and that we all were making her due too much.  Patient educated on goal of CIR and therapy. She verbalized understanding. Patient also consumed thin liquids via straw without overt s/s of aspiration. Patient left semi-reclined in bed with all needs within reach and alarm on. Continue with current plan of care.    Function:   Cognition Comprehension Comprehension assist level: Understands basic 50 - 74% of the time/ requires cueing 25 - 49% of the time  Expression   Expression assist level: Expresses basic 50 - 74% of the time/requires cueing 25 - 49% of the time. Needs to repeat parts of sentences.  Social Interaction Social Interaction assist level: Interacts appropriately 25 - 49% of time - Needs frequent redirection.  Problem Solving Problem solving assist level: Solves basic 25 - 49% of the time - needs direction more than half the time to initiate, plan or complete simple activities  Memory Memory assist level: Recognizes or recalls 25 -  49% of the time/requires cueing 50 - 75% of the time    Pain Reported a headache, patient pre-medicated   Therapy/Group: Individual Therapy  [name] 08/31/2015, 12:14 PM

## 2015-08-31 NOTE — Progress Notes (Signed)
Nursing Note. Resting quietly in bed. Pt has pulled off her gown an IV that was in place eariler, had been pulled out by pt.Pt has dried blood light red on pillow,sheets Pt has a small amount of bloody drainage to hands, and to L nose . Pt cleansed and back onto her side.

## 2015-08-31 NOTE — Progress Notes (Signed)
Preliminary results by tech - Bilateral Lower Ext. Venous Duplex Completed. No evidence of propagation of thrombus in the right leg from previous study done on 12/2. Partial thrombus is still present in the right gastro veins and peroneal veins. No evidence of DVT in the left leg.  Marilynne Halstedita Lariya Kinzie, BS, RDMS, RVT

## 2015-08-31 NOTE — Progress Notes (Signed)
Occupational Therapy Session Note  Patient Details  Name: Lisa [name] MRN: [id number] Date of Birth: 04/15/1927  Today's Date: 08/31/2015 OT Individual Time: 0800-0900 OT Individual Time Calculation (min): 60 min    Short Term Goals: Week 2:  OT Short Term Goal 1 (Week 2): Pt will engage in functional self care task for 10 mins or more with 1 rest break or less OT Short Term Goal 2 (Week 2): Pt will perform LB dressing tasks with max A sit<>stand OT Short Term Goal 3 (Week 2): Pt will perform toilet transfers with mod A stand pivot OT Short Term Goal 4 (Week 2): Pt will perform toileting tasks with mod A  Skilled Therapeutic Interventions/Progress Updates:    Pt resting in bed upon arrival with MD in attendance.  Pt not oriented to place this morning but agreeable to engaging in BADL retraining.  Pt engaged in BADL retraining including bathing and dressing with sit<>stand from w/c at sink.  Pt required max A for SPT transfers bed->w/c and w/c->recliner.  Pt required min A for sit<>stand at sink to complete LB bathing and dressing tasks at sink.  Pt stood X 5 at sink for approx 45 seconds each occurrence.  Pt continues to require multiple rest breaks throughout session.  Pt initiates LB dressing tasks but requires max A to complete tasks.  Focus on activity tolerance, sit<>stand, standing balance, task initiation, sequencing, and safety awareness to increase independence with BADLs and decrease burden of care.  Therapy Documentation Precautions:  Precautions Precautions: Fall Restrictions Weight Bearing Restrictions: No  Pain: Pain Assessment Pain Assessment: No/denies pain  See Function Navigator for Current Functional Status.   Therapy/Group: Individual Therapy  Rich BraveLanier, Dreydon Cardenas Chappell 08/31/2015, 9:04 AM

## 2015-09-01 ENCOUNTER — Inpatient Hospital Stay (HOSPITAL_COMMUNITY): Payer: Medicare Other | Admitting: *Deleted

## 2015-09-01 DIAGNOSIS — S069X1S Unspecified intracranial injury with loss of consciousness of 30 minutes or less, sequela: Secondary | ICD-10-CM

## 2015-09-01 NOTE — Progress Notes (Signed)
RT note: called to pt room by RN, RN stated family at bedside was concerned pt keeps saying she is SOB. Upon assessment: pt 99% on RA, no distress noted, BBS Clear. Family stated pt keeps rubbing her belly and saying she feels Short of breath. Pt denies any nausea or pain to belly. Pt is going to try to rest. RN notified.

## 2015-09-01 NOTE — Progress Notes (Signed)
Physical Therapy Session Note  Patient Details  Name: Lisa [name] MRN: [id number] Date of Birth: 11-17-1926  Today's Date: 09/01/2015 PT Individual Time: 1052-1125 PT Individual Time Calculation (min): 33 min   Short Term Goals: Week 1:  PT Short Term Goal 1 (Week 1): Patient will perform bed mobility with min A. PT Short Term Goal 1 - Progress (Week 1): Met PT Short Term Goal 2 (Week 1): Patient will transfer wheelchair <> bed with mod A.  PT Short Term Goal 2 - Progress (Week 1): Not met PT Short Term Goal 3 (Week 1): Patient will initiate ambulation.  PT Short Term Goal 3 - Progress (Week 1): Met PT Short Term Goal 4 (Week 1): Patient will maintain standing balance x 2 min with assist of one person.  PT Short Term Goal 4 - Progress (Week 1): Not met Week 2:  PT Short Term Goal 1 (Week 2): Patient will perform bed mobility with supervision. PT Short Term Goal 2 (Week 2): Patient will transfer wheelchair <> bed with consistent mod A.  PT Short Term Goal 3 (Week 2): Patient will ambulate 10 feet with assist of 1 person and +2 for wheelchair follow. PT Short Term Goal 4 (Week 2): Patient will maintain standing balance x 1 min with assist of one person.   Skilled Therapeutic Interventions/Progress Updates:  Tx focused on functional mobility training and NMR via standing balance, cognitive remediation, Nustep, and multi-modal cues. Pt needing encouragement to participate but was glad to have niece visit. Pt performed supine>sit with S and Max A for multiple stand-pivot transfers throughout tx. Pt needs consistent cues for anterior translation over BOS, completing turn, and technique. Mobility continues to be limited by fear of falling and retropulsion.  Pt propelled WC with up to Min A to avoid obstacles on L with bil UEs only.  Nustep x3 min for increased coordination and excursion with mobility, but limited by fatigue.  Pt encouraged to perform one final stand at sink for standing balance  x1 min with manual facilitation for upward gaze and posture, which was much improved with surface in front of her for security.  Pt left up with family and lap belt, all needs in reach.       Therapy Documentation Precautions:  Precautions Precautions: Fall Restrictions Weight Bearing Restrictions: No Pain: Pain Assessment Pain Assessment: 0-10 Pain Score: 0-No pain    Other Treatments:     See Function Navigator for Current Functional Status.   Therapy/Group: Individual Therapy  Kennieth Rad, PT, DPT   09/01/2015, 11:29 AM

## 2015-09-01 NOTE — Progress Notes (Signed)
[facility] PHYSICAL MEDICINE & REHABILITATION     PROGRESS NOTE    Subjective/Complaints: Denies traveling outside [place] in last 21 d   ROS limited due to cognitive/communication deficits  Objective: Vital Signs: Blood pressure 135/46, pulse 73, temperature 98.3 F (36.8 C), temperature source Oral, resp. rate 16, height  (1.6 m), weight 68.4 kg (150 lb 12.7 oz), SpO2 96 %. Ct Head Wo Contrast  08/30/2015  CLINICAL DATA:  Fatigue, confusion EXAM: CT HEAD WITHOUT CONTRAST TECHNIQUE: Contiguous axial images were obtained from the base of the skull through the vertex without intravenous contrast. COMPARISON:  08/19/2015 FINDINGS: Calvarium is intact. Subtle opacified left mastoid air cells are again seen, stable. Air-fluid level left maxillary sinus stable. Diffuse atrophy with white matter low attenuation stable. Particular low-attenuation left frontal lobe and to a lesser degree right frontal lobe consistent with known bifrontal edema, improving. Improving bifrontal contusions only minimally visible currently. Mild approximately 2 mm of midline shift toward the left, stable to slightly improved. Low-attenuation subdural collection on the right no change from prior study. No evidence of acute intracranial hemorrhage or extra-axial collection. IMPRESSION: Continued improvement in bilateral contusions and associated edema with anticipated evolution of right subdural collection. No acute findings. Electronically Signed   By: Esperanza Heir M.D.   On: 08/30/2015 17:40    Recent Labs  08/30/15 1640  WBC 6.6  HGB 10.8*  HCT 35.5*  PLT 288    Recent Labs  08/30/15 0656  NA 137  K 3.9  CL 101  GLUCOSE 82  BUN 15  CREATININE 1.28*  CALCIUM 8.7*   CBG (last 3)   Recent Labs  08/30/15 0625 08/30/15 1134 08/31/15 1121  GLUCAP 83 107* 96    Wt Readings from Last 3 Encounters:  09/01/15 68.4 kg (150 lb 12.7 oz)  08/22/15 67 kg (147 lb 11.3 oz)  08/03/15 65.772 kg (145 lb)     Physical Exam:  Constitutional: She is oriented to person, place, and time. She appears well-developed and well-nourished.     HENT:  Head: Normocephalic and atraumatic.  Right Ear: No decreased hearing is noted.  Left Ear: No decreased hearing is noted.  Mouth/Throat: Mucous membranes still dry. Debris on teeth as well..  Ecchymosis left forehead  resolved Eyes: Conjunctivae are normal. Pupils are equal, round, and reactive to light.   Neck: Normal range of motion. Neck supple.  Cardiovascular: Normal rate and regular rhythm.  Respiratory: Effort normal and breath sounds normal. No respiratory distress. She has no wheezes.  GI: Soft. Bowel sounds are normal. She exhibits no distension. There is no tenderness. There is no rebound.  Musculoskeletal: low back tender with truncal movement and palpation  Neurological: She is fairly alert and oriented to person, otherwise confused.   Left facial weakness. Limited insight and awareness. Skin: Skin is warm and dry.  Multiple scabbed areas bilateral feet healing    Motor 4/5 UE's. LE's 2-3/5 at hips/knees partly due to pain. 4/5 ankles     Sensation intact to light touch in bilateral upper and lower limbs Skin: indurated area right forearm with dry dressing  Assessment/Plan: 1. Weakness, balance and cognitive deficits secondary to traumatic brain injury which require 3  hours per day of interdisciplinary therapy in a comprehensive inpatient rehab setting. (reduced to allow for fatigue,poor activity tolerance thus far) Physiatrist is providing close team supervision and 24 hour management of active medical problems listed below. Physiatrist and rehab team continue to assess barriers to discharge/monitor  patient progress toward functional and medical goals.  Function:  Bathing Bathing position   Position: Wheelchair/chair at sink  Bathing parts Body parts bathed by patient: Right arm, Left arm, Chest, Abdomen, Right upper leg,  Left upper leg Body parts bathed by helper: Front perineal area, Right lower leg, Buttocks, Left lower leg, Back  Bathing assist Assist Level: 2 helpers      Upper Body Dressing/Undressing Upper body dressing   What is the patient wearing?: Bra, Pull over shirt/dress Bra - Perfomed by patient: Thread/unthread right bra strap, Thread/unthread left bra strap Bra - Perfomed by helper: Hook/unhook bra (pull down sports bra) Pull over shirt/dress - Perfomed by patient: Thread/unthread right sleeve, Thread/unthread left sleeve, Put head through opening, Pull shirt over trunk Pull over shirt/dress - Perfomed by helper: Pull shirt over trunk        Upper body assist Assist Level: Touching or steadying assistance(Pt > 75%)   Set up : To obtain clothing/put away  Lower Body Dressing/Undressing Lower body dressing   What is the patient wearing?: Pants, Socks, Underwear   Underwear - Performed by helper: Thread/unthread right underwear leg, Thread/unthread left underwear leg, Pull underwear up/down Pants- Performed by patient: Thread/unthread right pants leg, Thread/unthread left pants leg Pants- Performed by helper: Pull pants up/down   Non-skid slipper socks- Performed by helper: Don/doff right sock, Don/doff left sock       Shoes - Performed by helper: Don/doff right shoe, Don/doff left shoe, Fasten right, Fasten left          Lower body assist Assist for lower body dressing: 2 Designer, multimedia activity did not occur: No continent bowel/bladder event   Toileting steps completed by helper: Adjust clothing prior to toileting, Performs perineal hygiene, Adjust clothing after toileting Toileting Assistive Devices: Grab bar or rail  Toileting assist Assist level: Two helpers   Transfers Chair/bed transfer   Chair/bed transfer method: Stand pivot Chair/bed transfer assist level: Maximal assist (Pt 25 - 49%/lift and lower) Chair/bed transfer assistive  device: Armrests     Locomotion Ambulation Ambulation activity did not occur: Safety/medical concerns   Max distance: 10 Assist level: 2 helpers   Wheelchair   Type: Manual Max wheelchair distance: 150 Assist Level: Supervision or verbal cues  Cognition Comprehension Comprehension assist level: Understands basic 50 - 74% of the time/ requires cueing 25 - 49% of the time  Expression Expression assist level: Expresses basic 50 - 74% of the time/requires cueing 25 - 49% of the time. Needs to repeat parts of sentences.  Social Interaction Social Interaction assist level: Interacts appropriately 25 - 49% of time - Needs frequent redirection.  Problem Solving Problem solving assist level: Solves basic 25 - 49% of the time - needs direction more than half the time to initiate, plan or complete simple activities  Memory Memory assist level: Recognizes or recalls 25 - 49% of the time/requires cueing 50 - 75% of the time   Medical Problem List and Plan: 1. 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 DVT Prophylaxis/Anticoagulation: Right calf DVT without propagation---eliquis, asymptomatic    3. Chronic back pain/Pain Management: Used hydrocodone tid due to back and LE pain. Last [facility] 07/16/15.   -scheduled hydrocodone to help with activity tolerance     4. Mood: LCSW to follow for evaluation and support.  5. Neuropsych: This  patient is not fully capable of making decisions on her own behalf.  -suspect confusion  related to urosepsis and BI in 81 yo patient- orietned to person and place this am    -safety plan to prevent fall.   -will add low dose abilify QHS (per home regimen)for confusion and to encourage sleep (in place of risperdal)  -labs personally reviewed  6. Skin/Wound Care: Routine pressure relief measures. Maintain adequate  nutrition and hyrdration status.  7. Fluids/Electrolytes/Nutrition: Continue Dysphagia 1, honey liquids as swallow function shows minimal improvement. IVF at night as possible given NSL situation and push fluids during the day.   8. Acute on chronic renal failure: continue to push po fluids--- creat 1.2 this am, stable 9. HTN: Monitor tid for now.  10. Acute on chronic Iron deficiency anemia: continue iron supplement.  11. Aspiration PNA/H flu PNA: Has completed antibiotic course.  12. 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 Continue amiodarone daily and lopressor bid. To follow up with [facility] after discharge.  13. Rectal bleeding: Has resolved--without  recurrence. Will need follow up evaluation after discharge.  14. Depression: Used to take Celexa and Abilify--resume abilify today  15. Urinary retention?: Foley replaced due to poor UOP---continue for now.  -100k E Coli-Sensitive to cefazolin , keflex 250mg  tid x 7 days 16. Cellulitis right forearm: likely small hematoma---continue local care---area improved   LOS (Days) 10 A FACE TO FACE EVALUATION WAS PERFORMED  Claudette LawsKIRSTEINS,ANDREW E 09/01/2015 9:46 AM

## 2015-09-02 ENCOUNTER — Inpatient Hospital Stay (HOSPITAL_COMMUNITY): Payer: Medicare Other | Admitting: Physical Therapy

## 2015-09-02 NOTE — Progress Notes (Signed)
While in therapy, patient complained that her head was feeling "crazy" and stated "I have to wheel myself all the way down to the gym and I get so tired that I can't move."  Per patient, her head feeling "crazy" and her tiredness/weakness are new.  Vitals obtained by therapy.  Dr. Wynn BankerKirsteins notified of complaints and vitals; new order received for thigh-high ted hose when out of bed.  Will continue to monitor.

## 2015-09-02 NOTE — Progress Notes (Signed)
Physical Therapy Session Note  Patient Details  Name: Lisa [name] MRN: [id number] Date of Birth: 31-Jul-1927  Today's Date: 09/02/2015 PT Individual Time: 1100-1150 PT Individual Time Calculation (min): 50 min   Short Term Goals: Week 1:  PT Short Term Goal 1 (Week 1): Patient will perform bed mobility with min A. PT Short Term Goal 1 - Progress (Week 1): Met PT Short Term Goal 2 (Week 1): Patient will transfer wheelchair <> bed with mod A.  PT Short Term Goal 2 - Progress (Week 1): Not met PT Short Term Goal 3 (Week 1): Patient will initiate ambulation.  PT Short Term Goal 3 - Progress (Week 1): Met PT Short Term Goal 4 (Week 1): Patient will maintain standing balance x 2 min with assist of one person.  PT Short Term Goal 4 - Progress (Week 1): Not met  Skilled Therapeutic Interventions/Progress Updates:  Pt was seen bedside in the am. Pt rolled R/L with side rail and S to assist with donning pants. Pt transferred supine to edge of bed with side rail and min A. Pt transferred edge of bed to w/c stand pivot with mod to max A. Pt propelled w/c about 150 feet with B UE and LEs with S and verbal cues. In gym, pt stood x 3 with rolling walker and +2 min A. Pt c/o not feeling right, her head felt "crazy". BP 113/48, retook BP 119/56. BP was also taken immediately after sitting from standing 96/78. Final BP check was 115/53 with HR 84 and O2 sat 95%. Pt continued to c/o head not feeling well. Pt returned to room. Pt transferred w/c to edge of bed with max A and verbal cues. Pt transferred edge of bed to supine with side rails and S. Pt's nurse at bedside at end of treatment, notified nurse of pt's complaints and vitals.   Therapy Documentation Precautions:  Precautions Precautions: Fall Restrictions Weight Bearing Restrictions: No General: PT Missed Treatment Reason: Patient ill (Comment) (Pt c/o not feeling herself, her held felt "crazy", notified nsg of complaints) Vital Signs: Therapy  Vitals Pulse Rate: 86 BP: (!) 115/53 mmHg Patient Position (if appropriate): Sitting Oxygen Therapy SpO2: 95 % O2 Device: Not Delivered Pain: Pt c/o 6/10 back pain.  See Function Navigator for Current Functional Status.   Therapy/Group: Individual Therapy  Dub Amis 09/02/2015, 12:12 PM

## 2015-09-02 NOTE — Progress Notes (Signed)
[facility] PHYSICAL MEDICINE & REHABILITATION     PROGRESS NOTE    Subjective/Complaints: Low back pain, chronic and mild , reports surgery in the past, has kpad but not using  ROS limited due to cognitive/communication deficits  Objective: Vital Signs: Blood pressure 116/50, pulse 82, temperature 98.6 F (37 C), temperature source Oral, resp. rate 18, height 5\' 3"  (1.6 m), weight 64.3 kg (141 lb 12.1 oz), SpO2 97 %. No results found.  Recent Labs  08/30/15 1640  WBC 6.6  HGB 10.8*  HCT 35.5*  PLT 288   No results for input(s): NA, K, CL, GLUCOSE, BUN, CREATININE, CALCIUM in the last 72 hours.  Invalid input(s): CO CBG (last 3)   Recent Labs  08/30/15 1134 08/31/15 1121  GLUCAP 107* 96    Wt Readings from Last 3 Encounters:  09/02/15 64.3 kg (141 lb 12.1 oz)  08/22/15 67 kg (147 lb 11.3 oz)  08/03/15 65.772 kg (145 lb)    Physical Exam:  Constitutional: She is oriented to person, place, and time. She appears well-developed and well-nourished.     HENT:  Head: Normocephalic and atraumatic.  Right Ear: No decreased hearing is noted.  Left Ear: No decreased hearing is noted.  Mouth/Throat: Mucous membranes still dry. Debris on teeth as well..  Ecchymosis left forehead  resolved Eyes: Conjunctivae are normal. Pupils are equal, round, and reactive to light.   Neck: Normal range of motion. Neck supple.  Cardiovascular: Normal rate and regular rhythm.  Respiratory: Effort normal and breath sounds normal. No respiratory distress. She has no wheezes.  GI: Soft. Bowel sounds are normal. She exhibits no distension. There is no tenderness. There is no rebound.  Musculoskeletal: low back tender with truncal movement and palpation  Neurological: She is fairly alert and oriented to person, otherwise confused.   Left facial weakness. Limited insight and awareness. Skin: Skin is warm and dry.  Multiple scabbed areas bilateral feet healing    Motor 4/5 UE's. LE's  2-3/5 at hips/knees partly due to pain. 4/5 ankles     Sensation intact to light touch in bilateral upper and lower limbs Skin: indurated area right forearm with dry dressing  Assessment/Plan: 1. Weakness, balance and cognitive deficits secondary to traumatic brain injury which require 3  hours per day of interdisciplinary therapy in a comprehensive inpatient rehab setting. (reduced to allow for fatigue,poor activity tolerance thus far) Physiatrist is providing close team supervision and 24 hour management of active medical problems listed below. Physiatrist and rehab team continue to assess barriers to discharge/monitor patient progress toward functional and medical goals.  Function:  Bathing Bathing position   Position: Wheelchair/chair at sink  Bathing parts Body parts bathed by patient: Right arm, Left arm, Chest, Abdomen, Right upper leg, Left upper leg Body parts bathed by helper: Front perineal area, Right lower leg, Buttocks, Left lower leg, Back  Bathing assist Assist Level: 2 helpers      Upper Body Dressing/Undressing Upper body dressing   What is the patient wearing?: Bra, Pull over shirt/dress Bra - Perfomed by patient: Thread/unthread right bra strap, Thread/unthread left bra strap Bra - Perfomed by helper: Hook/unhook bra (pull down sports bra) Pull over shirt/dress - Perfomed by patient: Thread/unthread right sleeve, Thread/unthread left sleeve, Put head through opening, Pull shirt over trunk Pull over shirt/dress - Perfomed by helper: Pull shirt over trunk        Upper body assist Assist Level: Touching or steadying assistance(Pt > 75%)   Set up :  To obtain clothing/put away  Lower Body Dressing/Undressing Lower body dressing   What is the patient wearing?: Pants, Socks, Underwear   Underwear - Performed by helper: Thread/unthread right underwear leg, Thread/unthread left underwear leg, Pull underwear up/down Pants- Performed by patient: Thread/unthread right  pants leg, Thread/unthread left pants leg Pants- Performed by helper: Pull pants up/down   Non-skid slipper socks- Performed by helper: Don/doff right sock, Don/doff left sock       Shoes - Performed by helper: Don/doff right shoe, Don/doff left shoe, Fasten right, Fasten left          Lower body assist Assist for lower body dressing: 2 Designer, multimedia activity did not occur: No continent bowel/bladder event   Toileting steps completed by helper: Adjust clothing prior to toileting, Performs perineal hygiene, Adjust clothing after toileting Toileting Assistive Devices: Grab bar or rail  Toileting assist Assist level: Two helpers   Transfers Chair/bed transfer   Chair/bed transfer method: Stand pivot Chair/bed transfer assist level: Maximal assist (Pt 25 - 49%/lift and lower) Chair/bed transfer assistive device: Armrests     Locomotion Ambulation Ambulation activity did not occur: Refused   Max distance: 10 Assist level: 2 helpers   Wheelchair   Type: Manual Max wheelchair distance: 150 Assist Level: Touching or steadying assistance (Pt > 75%)  Cognition Comprehension Comprehension assist level: Understands basic 50 - 74% of the time/ requires cueing 25 - 49% of the time  Expression Expression assist level: Expresses basic 50 - 74% of the time/requires cueing 25 - 49% of the time. Needs to repeat parts of sentences.  Social Interaction Social Interaction assist level: Interacts appropriately 25 - 49% of time - Needs frequent redirection.  Problem Solving Problem solving assist level: Solves basic 25 - 49% of the time - needs direction more than half the time to initiate, plan or complete simple activities  Memory Memory assist level: Recognizes or recalls 25 - 49% of the time/requires cueing 50 - 75% of the time   Medical Problem List and Plan: 1. 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 DVT Prophylaxis/Anticoagulation: Right calf DVT without propagation---eliquis, asymptomatic    3. 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 Last [facility] 07/16/15.   -scheduled hydrocodone to help with activity tolerance     4. Mood: LCSW to follow for evaluation and support.  5. Neuropsych: This patient is not fully capable of making decisions on her own behalf.  -suspect confusion  related to urosepsis and BI in 81 yo patient- orietned to person and place this am    -safety plan to prevent fall.   -will add low dose abilify QHS (per home regimen)for confusion and to encourage sleep (in place of risperdal)  -labs personally reviewed  6. Skin/Wound Care: Routine pressure relief measures. Maintain adequate nutrition and hyrdration status.  7. Fluids/Electrolytes/Nutrition: Continue Dysphagia 1, honey liquids as swallow function shows minimal improvement. IVF at night as possible given NSL situation and push fluids during the day.   8. Acute on chronic renal failure: continue to push po fluids--- creat 1.2 this am, stable 9. HTN: Monitor tid for now.  10. Acute on chronic Iron deficiency anemia: continue iron supplement.  11. Aspiration PNA/H flu PNA: Has completed antibiotic course.  12. 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 Continue amiodarone daily and lopressor bid. To follow up with [facility] after discharge.  13. Rectal bleeding: Has resolved--without  recurrence. Will need follow up evaluation after discharge.  14. Depression: Used to take Celexa and Abilify--resume abilify today  15. Urinary retention?: Foley replaced due to poor UOP---continue for now.  -100k E Coli-Sensitive to cefazolin , keflex  tid x 7 days 16. Cellulitis right forearm: likely small hematoma---continue  local care---area improved   LOS (Days) 11 A FACE TO FACE EVALUATION WAS PERFORMED  Veralyn Lopp E 09/02/2015 9:22 AM

## 2015-09-03 ENCOUNTER — Inpatient Hospital Stay (HOSPITAL_COMMUNITY): Payer: Medicare Other | Admitting: Physical Therapy

## 2015-09-03 ENCOUNTER — Inpatient Hospital Stay (HOSPITAL_COMMUNITY): Payer: Medicare Other | Admitting: Speech Pathology

## 2015-09-03 ENCOUNTER — Inpatient Hospital Stay (HOSPITAL_COMMUNITY): Payer: Medicare Other

## 2015-09-03 ENCOUNTER — Inpatient Hospital Stay (HOSPITAL_COMMUNITY): Payer: Medicare Other | Admitting: Occupational Therapy

## 2015-09-03 NOTE — Progress Notes (Signed)
Physical Therapy Session Note  Patient Details  Name: Lisa [name] MRN: [id number] Date of Birth: [birth year]  Today's Date: 09/03/2015 PT Individual Time: 0800-0858 PT Individual Time Calculation (min): 58 min   Short Term Goals: Week 1:  PT Short Term Goal 1 (Week 1): Patient will perform bed mobility with min A. PT Short Term Goal 1 - Progress (Week 1): Met PT Short Term Goal 2 (Week 1): Patient will transfer wheelchair <> bed with mod A.  PT Short Term Goal 2 - Progress (Week 1): Not met PT Short Term Goal 3 (Week 1): Patient will initiate ambulation.  PT Short Term Goal 3 - Progress (Week 1): Met PT Short Term Goal 4 (Week 1): Patient will maintain standing balance x 2 min with assist of one person.  PT Short Term Goal 4 - Progress (Week 1): Not met Week 2:  PT Short Term Goal 1 (Week 2): Patient will perform bed mobility with supervision. PT Short Term Goal 2 (Week 2): Patient will transfer wheelchair <> bed with consistent mod A.  PT Short Term Goal 3 (Week 2): Patient will ambulate 10 feet with assist of 1 person and +2 for wheelchair follow. PT Short Term Goal 4 (Week 2): Patient will maintain standing balance x 1 min with assist of one person.   Skilled Therapeutic Interventions/Progress Updates:   Therapist donned Thigh high tedhose per MD order to prepare for OOB. Session focused on addressing functional bed mobility retraining including rolling for hygiene and brief change as well as donning pants and supine <-> sit with supervision, upright tolerance (though limited due to drops in BP), dynamic and static sitting balance for donning socks and shoes EOB, and basic transfers to recliner. Pt requires overall supervision to steady assist for bed mobility with verbal cues for technique and mod assist for stand pivot transfer with cues and manual faciliation for anterior weightshift and upright posture. Once seated in recliner, instructed in seated LE therex for functional strengthening  including heel/toe raises, LAQ and marches x 15 reps each. Notified RN of BP changes and RN reports she is planning to assist pt with breakfast after this session so positioned in recliner with BLE elevated and call bell in reach.   Therapy Documentation Precautions:  Precautions Precautions: Fall Restrictions Weight Bearing Restrictions: No Vital Signs: See Doc Flow sheets Vitals for all readings. Pain:  Denies pain.   See Function Navigator for Current Functional Status.   Therapy/Group: Individual Therapy  Canary Brim Ivory Broad, PT, DPT  09/03/2015, 9:00 AM

## 2015-09-03 NOTE — Progress Notes (Signed)
[place] PHYSICAL MEDICINE & REHABILITATION     PROGRESS NOTE    Subjective/Complaints: Low back sore per baseline. Reports she didn't sleep. Confused and agitated last night  ROS limited due to cognitive/communication deficits  Objective: Vital Signs: Blood pressure 128/48, pulse 70, temperature 98.6 F (37 C), temperature source Oral, resp. rate 18, height  (1.6 m), weight 69.4 kg (153 lb), SpO2 95 %. No results found. No results for input(s): WBC, HGB, HCT, PLT in the last 72 hours. No results for input(s): NA, K, CL, GLUCOSE, BUN, CREATININE, CALCIUM in the last 72 hours.  Invalid input(s): CO CBG (last 3)   Recent Labs  08/31/15 1121  GLUCAP 96    Wt Readings from Last 3 Encounters:  09/03/15 69.4 kg (153 lb)  08/22/15 67 kg (147 lb 11.3 oz)  08/03/15 65.772 kg (145 lb)    Physical Exam:  Constitutional: She is oriented to person, place, and time. She appears well-developed and well-nourished. Looks alert    HENT:  Head: Normocephalic and atraumatic.  Right Ear: No decreased hearing is noted.  Left Ear: No decreased hearing is noted.  Mouth/Throat: Mucous membranes still dry. Debris on teeth as well..    Eyes: Conjunctivae are normal. Pupils are equal, round, and reactive to light.   Neck: Normal range of motion. Neck supple.  Cardiovascular: Normal rate and regular rhythm.  Respiratory: Effort normal and breath sounds normal. No respiratory distress. She has no wheezes.  GI: Soft. Bowel sounds are normal. She exhibits no distension. There is no tenderness. There is no rebound.  Musculoskeletal: low back tender with truncal movement and palpation  Neurological: She is fairly alert and oriented to person, otherwise confused.   Left facial weakness. Limited insight and awareness. Skin: Skin is warm and dry.  Multiple scabbed areas bilateral feet improving but still present  Motor 4/5 UE's. LE's 2-3/5 at hips/knees partly due to pain. 4/5 ankles      Sensation intact to light touch in bilateral upper and lower limbs Skin: indurated area right forearm with dry dressing  Assessment/Plan: 1. Weakness, balance and cognitive deficits secondary to traumatic brain injury which require 3  hours per day of interdisciplinary therapy in a comprehensive inpatient rehab setting. (reduced to allow for fatigue,poor activity tolerance thus far) Physiatrist is providing close team supervision and 24 hour management of active medical problems listed below. Physiatrist and rehab team continue to assess barriers to discharge/monitor patient progress toward functional and medical goals.  Function:  Bathing Bathing position   Position: Wheelchair/chair at sink  Bathing parts Body parts bathed by patient: Right arm, Left arm, Chest, Abdomen, Right upper leg, Left upper leg Body parts bathed by helper: Front perineal area, Right lower leg, Buttocks, Left lower leg, Back  Bathing assist Assist Level: 2 helpers      Upper Body Dressing/Undressing Upper body dressing   What is the patient wearing?: Bra, Pull over shirt/dress Bra - Perfomed by patient: Thread/unthread right bra strap, Thread/unthread left bra strap Bra - Perfomed by helper: Hook/unhook bra (pull down sports bra) Pull over shirt/dress - Perfomed by patient: Thread/unthread right sleeve, Thread/unthread left sleeve, Put head through opening, Pull shirt over trunk Pull over shirt/dress - Perfomed by helper: Pull shirt over trunk        Upper body assist Assist Level: Touching or steadying assistance(Pt > 75%)   Set up : To obtain clothing/put away  Lower Body Dressing/Undressing Lower body dressing   What is the patient  wearing?: Pants, American Family Insuranceed Hose, Shoes   Underwear - Performed by helper: Thread/unthread right underwear leg, Thread/unthread left underwear leg, Pull underwear up/down Pants- Performed by patient: Thread/unthread right pants leg, Thread/unthread left pants leg Pants-  Performed by helper: Thread/unthread right pants leg, Thread/unthread left pants leg, Pull pants up/down   Non-skid slipper socks- Performed by helper: Don/doff right sock, Don/doff left sock       Shoes - Performed by helper: Don/doff right shoe, Don/doff left shoe, Fasten right, Fasten left     TED Hose - Performed by patient: Don/doff right TED hose, Don/doff left TED hose    Lower body assist Assist for lower body dressing: 2 Helpers      Financial traderToileting Toileting Toileting activity did not occur: No continent bowel/bladder event   Toileting steps completed by helper: Adjust clothing prior to toileting, Performs perineal hygiene, Adjust clothing after toileting Toileting Assistive Devices: Grab bar or rail  Toileting assist Assist level: Two helpers   Transfers Chair/bed transfer   Chair/bed transfer method: Squat pivot Chair/bed transfer assist level: Moderate assist (Pt 50 - 74%/lift or lower) Chair/bed transfer assistive device: Armrests     Locomotion Ambulation Ambulation activity did not occur: Refused   Max distance: 10 Assist level: 2 helpers   Wheelchair   Type: Manual Max wheelchair distance: 150 Assist Level: Supervision or verbal cues  Cognition Comprehension Comprehension assist level: Understands basic 50 - 74% of the time/ requires cueing 25 - 49% of the time  Expression Expression assist level: Expresses basic 50 - 74% of the time/requires cueing 25 - 49% of the time. Needs to repeat parts of sentences.  Social Interaction Social Interaction assist level: Interacts appropriately 25 - 49% of time - Needs frequent redirection.  Problem Solving Problem solving assist level: Solves basic 25 - 49% of the time - needs direction more than half the time to initiate, plan or complete simple activities  Memory Memory assist level: Recognizes or recalls 25 - 49% of the time/requires cueing 50 - 75% of the time   Medical Problem List and Plan: 1. Cognitive deficits,  gait disorder secondary to Traumatic frontal intracranial hemorrhage, subarachnoid hemorrhage and subdural hemorrhage  -therapy at 3hr per day due to poor activity tolerance 2. DVT Prophylaxis/Anticoagulation: Right calf DVT without propagation---eliquis, asymptomatic    3. 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 [facility]   -scheduled hydrocodone to help with activity tolerance     4. Mood: LCSW to follow for evaluation and support.  5. Neuropsych: This patient is not fully capable of making decisions on her own behalf.  -suspect confusion  related to urosepsis and BI in 81 yo patient- oriented to person and place this am    -safety plan to prevent fall.   -resumed abilify QHS (per home regimen)for confusion and to encourage sleep     6. Skin/Wound Care: Routine pressure relief measures. Maintain adequate nutrition and hyrdration status.  7. Fluids/Electrolytes/Nutrition: Continue Dysphagia 1, honey liquids as swallow function shows minimal improvement. IVF at night as possible given NSL situation and push fluids during the day.   8. Acute on chronic renal failure: continue to push po fluids--- re-check this week 9. HTN: Monitor tid for now.  10. Acute on chronic Iron deficiency anemia: continue iron supplement.  11. Aspiration PNA/H flu PNA: Has completed antibiotic course.  12. 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 Continue amiodarone daily  and lopressor bid. To follow up with [facility] after discharge.  13. Rectal bleeding: Has resolved--without  recurrence. Will need follow up evaluation after discharge.  14. Depression: Used to take Celexa and Abilify--resume abilify today  15. Urinary retention?: Foley replaced due to poor UOP---continue for now.  -100k E Coli-Sensitive to cefazolin , keflex completed---re-culture for resolution 16. Cellulitis right forearm:  -area  improved   LOS (Days) 12 A FACE TO FACE EVALUATION WAS PERFORMED  SWARTZ,ZACHARY T 09/03/2015 9:07 AM

## 2015-09-03 NOTE — Plan of Care (Signed)
Problem: RH SAFETY Goal: RH STG ADHERE TO SAFETY PRECAUTIONS W/ASSISTANCE/DEVICE STG Adhere to Safety Precautions With mod Assistance/Device.  Outcome: Not Progressing Patient with poor safety awareness at HS. Patient attempting to get out of bed, yelling out loud with confusion at times. adm  Problem: RH COGNITION-NURSING Goal: RH STG USES MEMORY AIDS/STRATEGIES W/ASSIST TO PROBLEM SOLVE STG Uses Memory Aids/Strategies With mod Assistance to Problem Solve.  Outcome: Not Progressing Patient with poor problem solving, and memory. adm

## 2015-09-03 NOTE — Progress Notes (Signed)
Physical Therapy Session Note  Patient Details  Name: Lisa [name] MRN: [id number] Date of Birth: [birth year]  Today's Date: 09/03/2015 PT Individual Time: 8889-1694 PT Individual Time Calculation (min): 30 min   Short Term Goals: Week 1:  PT Short Term Goal 1 (Week 1): Patient will perform bed mobility with min A. PT Short Term Goal 1 - Progress (Week 1): Met PT Short Term Goal 2 (Week 1): Patient will transfer wheelchair <> bed with mod A.  PT Short Term Goal 2 - Progress (Week 1): Not met PT Short Term Goal 3 (Week 1): Patient will initiate ambulation.  PT Short Term Goal 3 - Progress (Week 1): Met PT Short Term Goal 4 (Week 1): Patient will maintain standing balance x 2 min with assist of one person.  PT Short Term Goal 4 - Progress (Week 1): Not met  Skilled Therapeutic Interventions/Progress Updates:   Pt received in bed with pink bin beside of her.  Pt reporting feeling very nauseous and stating she may not be able to do very much.  Agreeable to work to tolerance.  Pt reporting nausea began yesterday during therapy and is worse with movement; she also reports dizziness with movement.  When asked to describe the dizziness, pt stating it feels like, "I am going to pass out and I have a headache in the front of my head, like a sinus headache."  RN alerted for anti-nausea medication.  Performed vestibular screen of patient including motion sensitivity testing: rolling L and R, sit <> supine with supervision, R and L Hallpike *modified with bed in reverse trendelenburg and head turned due to back pain*, head tipped to/from R and L knee, 5x head pitches and head rotation with denying any dizziness.  Pt did require one rest break in sidelying due to headache and nausea.  After receiving medication pt returned to sidelying with supervision.  Pt left with all items within reach.  Based on short screen patient's feelings of dizziness and nausea do not seem to be directly vestibular related but will  continue to assess.      Therapy Documentation Precautions:  Precautions Precautions: Fall Restrictions Weight Bearing Restrictions: No  Pain:  No c/o pain at rest; during mobility pt began to c/o nausea and headache; RN made aware--pt requesting anti-nausea medication but did not wish to take pain medication   See Function Navigator for Current Functional Status.   Therapy/Group: Individual Therapy  Raylene Everts [facility] 09/03/2015, 8:21 PM

## 2015-09-03 NOTE — Progress Notes (Signed)
Speech Language Pathology Daily Session Note  Patient Details  Name: Lisa [name] MRN: [id number] Date of Birth: 07/13/1927  Today's Date: 09/03/2015 SLP Individual Time: 1000-1100 SLP Individual Time Calculation (min): 60 min  Short Term Goals: Week 2: SLP Short Term Goal 1 (Week 2): Patient will consume current diet with minimal overt s/s of aspiration with supervision verbal cues for use of swallow strategies.  SLP Short Term Goal 2 (Week 2): Patient will sustain attention to a functional task for 5 minutes with Mod A verbal cues for redirection.  SLP Short Term Goal 3 (Week 2): Patient will identify 2 physical and 2 cognitive deficits with Mod A question and semantic cues.  SLP Short Term Goal 4 (Week 2): Patient will initiate functional tasks with no more than 1 visual and 1 verbal cue in 75% of opportunities.  SLP Short Term Goal 5 (Week 2): Patient will demonstrate functional problem solving for basic and familiar tasks with Mod A verbal cues.   Skilled Therapeutic Interventions: Skilled treatment session focused on dysphagia and cognitive goals.  Upon arrival, patient reported her stomach was upset and asked for crackers. SLP facilitated session by providing skilled observation with trials of regular textures. Patient demonstrated mildly prolonged mastication and consistently utilized a liquid wash to clear oral residuals. Patient demonstrated an intermittent wet vocal quality that cleared with a cued throat clear. Recommend patient continue current diet with continued trials with SLP only. SLP also facilitated session by providing Max A question and verbal cues for recall of the patient's current medications and their functions and Mod A question and verbal cues for functional problem solving with organizing a 4 time per day pill box. Patient appeared fatigued throughout the session and required Mod A verbal cues for sustained attention to task. Patient left upright in recliner with all  needs within reach and quick release belt in place.  Continue with current plan of care.    Function:  Eating Eating   Modified Consistency Diet: Yes Eating Assist Level: More than reasonable amount of time   Eating Set Up Assist For: Opening containers       Cognition Comprehension Comprehension assist level: Understands basic 75 - 89% of the time/ requires cueing 10 - 24% of the time  Expression   Expression assist level: Expresses basic 75 - 89% of the time/requires cueing 10 - 24% of the time. Needs helper to occlude trach/needs to repeat words.  Social Interaction Social Interaction assist level: Interacts appropriately 50 - 74% of the time - May be physically or verbally inappropriate.  Problem Solving Problem solving assist level: Solves basic 25 - 49% of the time - needs direction more than half the time to initiate, plan or complete simple activities  Memory Memory assist level: Recognizes or recalls 25 - 49% of the time/requires cueing 50 - 75% of the time    Pain No/Denies Pain   Therapy/Group: Individual Therapy  Lisa [name] 09/03/2015, 3:56 PM

## 2015-09-03 NOTE — Progress Notes (Signed)
Occupational Therapy Session Note  Patient Details  Name: Lisa [name] MRN: [id number] Date of Birth: 1927/08/06  Today's Date: 09/03/2015 OT Individual Time: 1100-1150 OT Individual Time Calculation (min): 50 min  and Today's Date: 09/03/2015 OT Missed Time: 10 Minutes Missed Time Reason: Pain   Short Term Goals: Week 1:  OT Short Term Goal 1 (Week 1): Pt will be engaged in 10 minutes of functional self care tasks with requiring less than 1 rest break . OT Short Term Goal 1 - Progress (Week 1): Progressing toward goal OT Short Term Goal 2 (Week 1): Pt will perform LB dressing with max A in order to decrease level of assist needed with self care. OT Short Term Goal 2 - Progress (Week 1): Progressing toward goal OT Short Term Goal 3 (Week 1): Pt will perform UB dressing with min A in order to decrease level of assist needed with self care. OT Short Term Goal 3 - Progress (Week 1): Met OT Short Term Goal 4 (Week 1): Pt will perform toilet transfer with mod A in order to increase I with functional transfers. OT Short Term Goal 4 - Progress (Week 1): Progressing toward goal Week 2:  OT Short Term Goal 1 (Week 2): Pt will engage in functional self care task for 10 mins or more with 1 rest break or less OT Short Term Goal 2 (Week 2): Pt will perform LB dressing tasks with max A sit<>stand OT Short Term Goal 3 (Week 2): Pt will perform toilet transfers with mod A stand pivot OT Short Term Goal 4 (Week 2): Pt will perform toileting tasks with mod A  Skilled Therapeutic Interventions/Progress Updates:    Pt seen for skilled OT to facilitate functional mobility skills and dynamic balance to improve ADLs. Pt received in recliner fully dressed. She declined undressing to bathe.  She did agree to grooming tasks at the sink, but stated she felt very dizzy.  Encouraged pt to participate and that we would move slowly. Pt was able to push up from the recliner using arm rests with only slight guiding A, once  in standing she was pushing back into a posterior position and then needed max A to stand pivot to the bed, and then only guiding A to sit. Repeated transfer to w/c. Pt completed grooming at sink and then stated she was very dizzy. Rested for awhile and then pt taken to the gym to work on standing at parallel bars. Pt asked to stand and she attempted to pull herself up, pt cued to push up from chair and she got frustrated that no one understands that she can not do that.  Reminded pt that she was able to do that in the room successfully, but she became more frustrated stating she was too dizzy and now had a bad headache. Provided pt with gingerale. Pt continued to c/o headache so was taken back to room and she was able to stand up from w/c using armrests with only guiding A and then mod A stand pivot to bed with less posterior lean. Pt adjusted in bed with all needs met. Reported to nursing about headache/ dizziness.   Therapy Documentation Precautions:  Precautions Precautions: Fall Restrictions Weight Bearing Restrictions: No  General OT Amount of Missed Time: 10 Minutes Vital Signs: Therapy Vitals BP: (!) 128/48 mmHg Patient Position (if appropriate): Sitting Pain: Pain Assessment Pain Assessment: 0-10 Pain Score: 6  Pain Type: Acute pain Pain Location: Head Pain Descriptors / Indicators: Aching Pain  Onset: Gradual Pain Intervention(s): RN made aware;Rest ADL:  See Function Navigator for Current Functional Status.   Therapy/Group: Individual Therapy  Lisa [name] 09/03/2015, 12:06 PM

## 2015-09-04 ENCOUNTER — Inpatient Hospital Stay (HOSPITAL_COMMUNITY): Payer: Medicare Other | Admitting: Speech Pathology

## 2015-09-04 ENCOUNTER — Inpatient Hospital Stay (HOSPITAL_COMMUNITY): Payer: Medicare Other | Admitting: Occupational Therapy

## 2015-09-04 ENCOUNTER — Inpatient Hospital Stay (HOSPITAL_COMMUNITY): Payer: Medicare Other | Admitting: Physical Therapy

## 2015-09-04 DIAGNOSIS — I82401 Acute embolism and thrombosis of unspecified deep veins of right lower extremity: Secondary | ICD-10-CM

## 2015-09-04 LAB — BASIC METABOLIC PANEL
ANION GAP: 8 (ref 5–15)
BUN: 14 mg/dL (ref 6–20)
CHLORIDE: 95 mmol/L — AB (ref 101–111)
CO2: 29 mmol/L (ref 22–32)
Calcium: 8.9 mg/dL (ref 8.9–10.3)
Creatinine, Ser: 1.34 mg/dL — ABNORMAL HIGH (ref 0.44–1.00)
GFR calc non Af Amer: 35 mL/min — ABNORMAL LOW (ref >60)
GFR, EST AFRICAN AMERICAN: 40 mL/min — AB (ref >60)
Glucose, Bld: 91 mg/dL (ref 65–99)
POTASSIUM: 4.2 mmol/L (ref 3.5–5.1)
SODIUM: 132 mmol/L — AB (ref 135–145)

## 2015-09-04 LAB — CBC
HEMATOCRIT: 33.3 % — AB (ref 36.0–46.0)
HEMOGLOBIN: 10.3 g/dL — AB (ref 12.0–15.0)
MCH: 29.5 pg (ref 26.0–34.0)
MCHC: 30.9 g/dL (ref 30.0–36.0)
MCV: 95.4 fL (ref 78.0–100.0)
Platelets: 291 10*3/uL (ref 150–400)
RBC: 3.49 MIL/uL — AB (ref 3.87–5.11)
RDW: 17 % — ABNORMAL HIGH (ref 11.5–15.5)
WBC: 6.6 10*3/uL (ref 4.0–10.5)

## 2015-09-04 MED ORDER — ARIPIPRAZOLE 10 MG PO TABS
10.0000 mg | ORAL_TABLET | Freq: Every day | ORAL | Status: DC
  Administered 2015-09-04: 10 mg via ORAL
  Filled 2015-09-04 (×2): qty 1

## 2015-09-04 MED ORDER — CITALOPRAM HYDROBROMIDE 20 MG PO TABS
20.0000 mg | ORAL_TABLET | Freq: Every day | ORAL | Status: DC
  Administered 2015-09-04 – 2015-09-11 (×8): 20 mg via ORAL
  Filled 2015-09-04 (×8): qty 1

## 2015-09-04 MED ORDER — PRO-STAT SUGAR FREE PO LIQD
30.0000 mL | Freq: Every day | ORAL | Status: DC
  Administered 2015-09-04 – 2015-09-11 (×8): 30 mL via ORAL
  Filled 2015-09-04 (×9): qty 30

## 2015-09-04 NOTE — Progress Notes (Signed)
Occupational Therapy Session Note  Patient Details  Name: Lisa [name] MRN: [id number] Date of Birth: Jan 05, 1927  Today's Date: 09/04/2015 OT Individual Time: 1000-1100 OT Individual Time Calculation (min): 60 min    Short Term Goals: Week 1:  OT Short Term Goal 1 (Week 1): Pt will be engaged in 10 minutes of functional self care tasks with requiring less than 1 rest break . OT Short Term Goal 1 - Progress (Week 1): Progressing toward goal OT Short Term Goal 2 (Week 1): Pt will perform LB dressing with max A in order to decrease level of assist needed with self care. OT Short Term Goal 2 - Progress (Week 1): Progressing toward goal OT Short Term Goal 3 (Week 1): Pt will perform UB dressing with min A in order to decrease level of assist needed with self care. OT Short Term Goal 3 - Progress (Week 1): Met OT Short Term Goal 4 (Week 1): Pt will perform toilet transfer with mod A in order to increase I with functional transfers. OT Short Term Goal 4 - Progress (Week 1): Progressing toward goal Week 2:  OT Short Term Goal 1 (Week 2): Pt will engage in functional self care task for 10 mins or more with 1 rest break or less OT Short Term Goal 2 (Week 2): Pt will perform LB dressing tasks with max A sit<>stand OT Short Term Goal 3 (Week 2): Pt will perform toilet transfers with mod A stand pivot OT Short Term Goal 4 (Week 2): Pt will perform toileting tasks with mod A  Skilled Therapeutic Interventions/Progress Updates:    Pt seen for skilled OT to facilitate ADL skills and functional mobility.  Pt agreeable to shower.  Demonstrated a great deal of improvement with all sit to stands today as she only needed slight guiding assist to stand. When standing today she only had a slight posterior lean versus a severe lean yesterday.  She did need cues to wt shift forward as therapist assisted with pants over hips and in shower to wash her bottom.  Pt did well using grab bars to stand pivot in shower.   Improved participation today, but very tired at end of session and requested to get back in bed. Pt with all needs met.  Therapy Documentation Precautions:  Precautions Precautions: Fall Restrictions Weight Bearing Restrictions: No  Pain: Pain Assessment Pain Assessment: No/denies pain ADL:   See Function Navigator for Current Functional Status.   Therapy/Group: Individual Therapy  Kaisei Gilbo 09/04/2015, 12:18 PM

## 2015-09-04 NOTE — Progress Notes (Signed)
Speech Language Pathology Daily Session Note  Patient Details  Name: Lisa SnellenFaye K [name] MRN: [id number] Date of Birth: 07/31/1927  Today's Date: 09/04/2015 SLP Individual Time: 1135-1200 SLP Individual Time Calculation (min): 25 min  Short Term Goals: Week 2: SLP Short Term Goal 1 (Week 2): Patient will consume current diet with minimal overt s/s of aspiration with supervision verbal cues for use of swallow strategies.  SLP Short Term Goal 2 (Week 2): Patient will sustain attention to a functional task for 5 minutes with Mod A verbal cues for redirection.  SLP Short Term Goal 3 (Week 2): Patient will identify 2 physical and 2 cognitive deficits with Mod A question and semantic cues.  SLP Short Term Goal 4 (Week 2): Patient will initiate functional tasks with no more than 1 visual and 1 verbal cue in 75% of opportunities.  SLP Short Term Goal 5 (Week 2): Patient will demonstrate functional problem solving for basic and familiar tasks with Mod A verbal cues.   Skilled Therapeutic Interventions: Skilled treatment session focused on cognitive goals. SLP facilitated session by providing Mod A verbal and question cues for orientation to place and situation and was Mod I for orientation to date. Patient demonstrated intermittent confabulation throughout the session but was able to recall events from previous therapy sessions with Min A question cues and extra time. Attempted to observe patient with lunch meal, however, the meal did not come on time but patient consumed thin liquids via straw without overt s/s of aspiration. Patient left upright in bed with all needs within reach and alarm on. Continue with current plan of care.    Function:  Cognition Comprehension Comprehension assist level: Understands basic 75 - 89% of the time/ requires cueing 10 - 24% of the time  Expression   Expression assist level: Expresses basic 75 - 89% of the time/requires cueing 10 - 24% of the time. Needs helper to occlude  trach/needs to repeat words.  Social Interaction Social Interaction assist level: Interacts appropriately 50 - 74% of the time - May be physically or verbally inappropriate.  Problem Solving Problem solving assist level: Solves basic 25 - 49% of the time - needs direction more than half the time to initiate, plan or complete simple activities  Memory Memory assist level: Recognizes or recalls 25 - 49% of the time/requires cueing 50 - 75% of the time    Pain Pain Assessment Pain Assessment: No/denies pain  Therapy/Group: Individual Therapy  Kili Gracy 09/04/2015, 2:41 PM

## 2015-09-04 NOTE — Progress Notes (Signed)
Physical Therapy Session Note  Patient Details  Name: Lisa [name] MRN: [id number] Date of Birth: 05/09/1927  Today's Date: 09/04/2015 PT Individual Time: 0800-0900 and 1620-1700 PT Individual Time Calculation (min): 60 min and 40 min  Short Term Goals: Week 2:  PT Short Term Goal 1 (Week 2): Patient will perform bed mobility with supervision. PT Short Term Goal 2 (Week 2): Patient will transfer wheelchair <> bed with consistent mod A.  PT Short Term Goal 3 (Week 2): Patient will ambulate 10 feet with assist of 1 person and +2 for wheelchair follow. PT Short Term Goal 4 (Week 2): Patient will maintain standing balance x 1 min with assist of one person.   Skilled Therapeutic Interventions/Progress Updates:   Treatment 1: Patient asleep in bed, slow to arouse to voice and requiring increased time x 10 min and encouragement to transfer to edge of bed with use of rail and supervision. Patient sat edge of bed with feet supported x 10 min to consume breakfast meal with no overt signs/symptoms of aspiration. Patient c/o nausea but requested to continue eating, then immediately requested to lie down. RN notified of nausea and patient given 5 min supine rest. Patient returned to sitting and performed stand pivot transfer to wheelchair with max A, limited by retropulsion and fear of falling limiting patient's ability to move feet to chair. Transported to gym and required max encouragement to participate. Patient eventually performed sit > stand with cues to push up from arm rests with mod A and ambulated x 47 ft with mod A and therapist advancing/controlling RW with +2A for wheelchair follow for safety. Patient continuously stating, "I can't, I can't, I can't!" but was able to continue ambulating with therapist facilitating advancement with RW. Patient requested to return to bed, performed stand pivot transfer with mod A and transferred sit > supine with supervision. Patient left semi reclined in bed with all  needs within reach.     Treatment 2: Patient in bed with son Lisa [name] present for session. With increased time, patient performed bed mobility with supervision using rail and stand pivot transfers x 2 from bed <> wheelchair with max A and cues for sequencing and technique. Patient refused further transfer training practice. Patient propelled wheelchair towards gym x 50 ft with supervision and back towards room x 75 ft with supervision and encouragement for continuation of task. Patient positioned in front of stairs and performed sit <> stand with mod A and remained standing with BUE support on 2 rails but then refused to negotiate steps, stating she was tired and her whole body felt "lightheaded." Patient requested to return to room. In room, patient declined need to toilet but performed stand pivot transfer to and from toilet using grab bar with max A and was continent of urine. Patient required total A for clothing management and performed hygiene with supervision. RN requested patient return to bed for bladder scan and left semi reclined in bed with RN present. Patient and son educated on patient's progress to date as well as current level of assistance required.   Therapy Documentation Precautions:  Precautions Precautions: Fall Restrictions Weight Bearing Restrictions: No Pain: Pain Assessment Pain Assessment: No/denies pain   See Function Navigator for Current Functional Status.   Therapy/Group: Individual Therapy  Kerney ElbeVarner, Yoshi Vicencio A 09/04/2015, 8:56 AM

## 2015-09-04 NOTE — Progress Notes (Signed)
[name]     PROGRESS NOTE    Subjective/Complaints: Restless night. Removed clothing, confused. Resting comfortably at present.   ROS limited due to cognitive/communication deficits  Objective: Vital Signs: Blood pressure 125/49, pulse 75, temperature 98.5 F (36.9 C), temperature source Oral, resp. rate 17, height 5\' 3"  (1.6 m), weight 63.5 kg (139 lb 15.9 oz), SpO2 96 %. No results found.  Recent Labs  09/04/15 0631  WBC 6.6  HGB 10.3*  HCT 33.3*  PLT 291    Recent Labs  09/04/15 0631  NA 132*  K 4.2  CL 95*  GLUCOSE 91  BUN 14  CREATININE 1.34*  CALCIUM 8.9   CBG (last 3)  No results for input(s): GLUCAP in the last 72 hours.  Wt Readings from Last 3 Encounters:  09/04/15 63.5 kg (139 lb 15.9 oz)  08/22/15 67 kg (147 lb 11.3 oz)  08/03/15 65.772 kg (145 lb)    Physical Exam:  Constitutional: She is oriented to person, place, and time. She appears well-developed and well-nourished. Looks alert    HENT:  Head: Normocephalic and atraumatic.  Right Ear: No decreased hearing is noted.  Left Ear: No decreased hearing is noted.  Mouth/Throat: Mucous membranes still dry. Debris on teeth as well..    Eyes: Conjunctivae are normal. Pupils are equal, round, and reactive to light.   Neck: Normal range of motion. Neck supple.  Cardiovascular: Normal rate and regular rhythm.  Respiratory: Effort normal and breath sounds normal. No respiratory distress. She has no wheezes.  GI: Soft. Bowel sounds are normal. She exhibits no distension. There is no tenderness. There is no rebound.  Musculoskeletal: low back tender with truncal movement and palpation  Neurological: She is sleeping, slow to arouse.   Left facial weakness. Limited insight and awareness. Skin: Skin is warm and dry.  Multiple scabbed areas bilateral feet improving but still present  Motor 4/5 UE's. LE's 2-3/5 at hips/knees partly due to pain. 4/5 ankles      Sensation intact to light touch in bilateral upper and lower limbs Skin: indurated area right forearm with dry dressing  Assessment/Plan: 1. Weakness, balance and cognitive deficits secondary to traumatic brain injury which require 3  hours per day of interdisciplinary therapy in a comprehensive inpatient rehab setting. (reduced to allow for fatigue,poor activity tolerance thus far) Physiatrist is providing close team supervision and 24 hour management of active medical problems listed below. Physiatrist and rehab team continue to assess barriers to discharge/monitor patient progress toward functional and medical goals.  Function:  Bathing Bathing position   Position: Wheelchair/chair at sink  Bathing parts Body parts bathed by patient: Right arm, Left arm, Chest, Abdomen, Right upper leg, Left upper leg Body parts bathed by helper: Front perineal area, Right lower leg, Buttocks, Left lower leg, Back  Bathing assist Assist Level: 2 helpers      Upper Body Dressing/Undressing Upper body dressing   What is the patient wearing?: Bra, Pull over shirt/dress Bra - Perfomed by patient: Thread/unthread right bra strap, Thread/unthread left bra strap Bra - Perfomed by helper: Hook/unhook bra (pull down sports bra) Pull over shirt/dress - Perfomed by patient: Thread/unthread right sleeve, Thread/unthread left sleeve, Put head through opening, Pull shirt over trunk Pull over shirt/dress - Perfomed by helper: Pull shirt over trunk        Upper body assist Assist Level: Touching or steadying assistance(Pt > 75%)   Set up : To obtain clothing/put away  Lower  Body Dressing/Undressing Lower body dressing   What is the patient wearing?: Pants, Ted Hose, Shoes   Underwear - Performed by helper: Thread/unthread right underwear leg, Thread/unthread left underwear leg, Pull underwear up/down Pants- Performed by patient: Thread/unthread right pants leg, Thread/unthread left pants leg Pants-  Performed by helper: Thread/unthread right pants leg, Thread/unthread left pants leg, Pull pants up/down   Non-skid slipper socks- Performed by helper: Don/doff right sock, Don/doff left sock       Shoes - Performed by helper: Don/doff right shoe, Don/doff left shoe, Fasten right, Fasten left     TED Hose - Performed by patient: Don/doff right TED hose, Don/doff left TED hose    Lower body assist Assist for lower body dressing: 2 Designer, multimedia activity did not occur: No continent bowel/bladder event   Toileting steps completed by helper: Adjust clothing prior to toileting, Performs perineal hygiene, Adjust clothing after toileting Toileting Assistive Devices: Grab bar or rail  Toileting assist Assist level: Two helpers   Transfers Chair/bed transfer   Chair/bed transfer method: Stand pivot Chair/bed transfer assist level: Maximal assist (Pt 25 - 49%/lift and lower) Chair/bed transfer assistive device: Armrests     Locomotion Ambulation Ambulation activity did not occur: Refused   Max distance: 10 Assist level: 2 helpers   Wheelchair   Type: Manual Max wheelchair distance: 150 Assist Level: Supervision or verbal cues  Cognition Comprehension Comprehension assist level: Understands basic 75 - 89% of the time/ requires cueing 10 - 24% of the time  Expression Expression assist level: Expresses basic 75 - 89% of the time/requires cueing 10 - 24% of the time. Needs helper to occlude trach/needs to repeat words.  Social Interaction Social Interaction assist level: Interacts appropriately 50 - 74% of the time - May be physically or verbally inappropriate.  Problem Solving Problem solving assist level: Solves basic 25 - 49% of the time - needs direction more than half the time to initiate, plan or complete simple activities  Memory Memory assist level: Recognizes or recalls 25 - 49% of the time/requires cueing 50 - 75% of the time   Medical Problem  List and Plan: 1. 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 DVT Prophylaxis/Anticoagulation: Right calf DVT without propagation---eliquis, asymptomatic    3. 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 Last [facility] 07/16/15.   -scheduled hydrocodone to help with activity tolerance     4. Mood: LCSW to follow for evaluation and support.  5. Neuropsych: This patient is not fully capable of making decisions on her own behalf.  -safety plan to prevent fall.   -resumed abilify QHS (per home regimen)for sundowning and sleep ---minimal effect. ?increase dose  -dc foley as it seems to be causing some of agitation at night  -schedule trazodone qhs also    6. Skin/Wound Care: Routine pressure relief measures. Maintain adequate nutrition and hyrdration status.  7. Fluids/Electrolytes/Nutrition: Continue Dysphagia 1, honey liquids as swallow function shows minimal improvement. IVF at night as possible given NSL situation and push fluids during the day.   8. Acute on chronic renal failure: continue to push po fluids--- re-check this week 9. HTN: Monitor tid for now.  10. Acute on chronic Iron deficiency anemia: continue iron supplement.  11. Aspiration PNA/H flu PNA: Has completed antibiotic course.  12. 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 Continue amiodarone daily and lopressor bid. To follow up with [facility] after discharge.  13. Rectal bleeding: Has resolved--without  recurrence. Will need follow up evaluation after discharge.  14. Depression: Used to take Celexa and Abilify--resumed abilify   15. Urinary retention?: will remove foley and attempt voiding trial  -100k E Coli-Sensitive to cefazolin , keflex completed---re-culture for resolution  pending 16. Cellulitis right forearm:  -area improved   LOS (Days) 13 A FACE TO FACE EVALUATION WAS PERFORMED  Lisa [name] 09/04/2015 7:38 AM

## 2015-09-04 NOTE — Progress Notes (Addendum)
Foley d/ced at 1330 by Abraham,NT.  Voided lg. Amt.up on toilet at 1600 with P.T.. Bladder scan 34cc.  Continue to monitor PVRs: OOB to BR for all voids.

## 2015-09-04 NOTE — Progress Notes (Signed)
Nutrition Follow-up  DOCUMENTATION CODES:   Not applicable  INTERVENTION:  Continue Ensure Enlive po TID, each supplement provides 350 kcal and 20 grams of protein.  Provide 30 ml Prostat po once daily, each supplement provides 100 kcal and 15 grams of protein.   Encourage adequate PO intake.  NUTRITION DIAGNOSIS:   Inadequate oral intake related to poor appetite as evidenced by meal completion < 25%;  ongoing  GOAL:   Patient will meet greater than or equal to 90% of their needs; progressing  MONITOR:   PO intake, Supplement acceptance, Diet advancement, Weight trends, I & O's, Labs, Skin  REASON FOR ASSESSMENT:   Malnutrition Screening Tool    ASSESSMENT:   81 y.o. Left handed female with history of HTN, depression, spinal stenosis with BLE weakness and post laminectomy syndrome with chronic pain who was admitted on 08/03/15 after a fall in parking lot and subsequent acute traumatic SAH along frontal convexities and acute SDH with confusion and waxing and waining of mental status.  She continues to have bouts of lethargy and follow up CT 11/27 showed overall improvement with near resolution of SAH and bifrontal parenchymal hemorrhage as well as subtle new 3 mm midline shift.  Meal completion has been 15-50%. Pt currently has Ensure ordered TID and has been consuming most of them. RD to continue with current orders. RD to additionally order Prostat to aid in protein needs. Pt encouraged to eat her food at meals and to drink her supplements.   Labs and medications reviewed.   Diet Order:  DIET DYS 2 Room service appropriate?: Yes; Fluid consistency:: Thin  Skin:  Wound (see comment) (Stg II pressure ulcer on coccyx)  Last BM:  12/4  Height:   Ht Readings from Last 1 Encounters:  08/22/15 5\' 3"  (1.6 m)    Weight:   Wt Readings from Last 1 Encounters:  09/04/15 139 lb 15.9 oz (63.5 kg)    Ideal Body Weight:  52.2 kg  BMI:  Body mass index is 24.8  kg/(m^2).  Estimated Nutritional Needs:   Kcal:  1600-1800  Protein:  80-90 grams  Fluid:  1.6 -1.8 L/day  EDUCATION NEEDS:   No education needs identified at this time  Roslyn SmilingStephanie Neel Buffone, MS, RD, LDN Pager # [phone] After hours/ weekend pager # [phone]

## 2015-09-05 ENCOUNTER — Inpatient Hospital Stay (HOSPITAL_COMMUNITY): Payer: Medicare Other | Admitting: Occupational Therapy

## 2015-09-05 ENCOUNTER — Inpatient Hospital Stay (HOSPITAL_COMMUNITY): Payer: Medicare Other | Admitting: Physical Therapy

## 2015-09-05 ENCOUNTER — Inpatient Hospital Stay (HOSPITAL_COMMUNITY): Payer: Medicare Other | Admitting: Speech Pathology

## 2015-09-05 LAB — URINE CULTURE: Culture: >=100000

## 2015-09-05 LAB — GLUCOSE, CAPILLARY: Glucose-Capillary: 79 mg/dL (ref 65–99)

## 2015-09-05 MED ORDER — QUETIAPINE FUMARATE 50 MG PO TABS
50.0000 mg | ORAL_TABLET | Freq: Every day | ORAL | Status: DC
  Administered 2015-09-05 – 2015-09-11 (×7): 50 mg via ORAL
  Filled 2015-09-05 (×7): qty 1

## 2015-09-05 MED ORDER — PANTOPRAZOLE SODIUM 20 MG PO TBEC
20.0000 mg | DELAYED_RELEASE_TABLET | Freq: Two times a day (BID) | ORAL | Status: DC
  Administered 2015-09-05 (×2): 20 mg via ORAL
  Filled 2015-09-05 (×2): qty 1

## 2015-09-05 MED ORDER — MEGESTROL ACETATE 400 MG/10ML PO SUSP
400.0000 mg | Freq: Every day | ORAL | Status: DC
  Administered 2015-09-05 – 2015-09-12 (×8): 400 mg via ORAL
  Filled 2015-09-05 (×9): qty 10

## 2015-09-05 NOTE — Progress Notes (Addendum)
[facility] PHYSICAL MEDICINE & REHABILITATION     PROGRESS NOTE    Subjective/Complaints: Had a better night overall. Slept form 9-3am. Had some incontinence during the day.   ROS limited due to cognitive/communication deficits  Objective: Vital Signs: Blood pressure 124/59, pulse 76, temperature 97.8 F (36.6 C), temperature source Oral, resp. rate 18, height 5\' 3"  (1.6 m), weight 63.5 kg (139 lb 15.9 oz), SpO2 100 %. No results found.  Recent Labs  09/04/15 0631  WBC 6.6  HGB 10.3*  HCT 33.3*  PLT 291    Recent Labs  09/04/15 0631  NA 132*  K 4.2  CL 95*  GLUCOSE 91  BUN 14  CREATININE 1.34*  CALCIUM 8.9   CBG (last 3)  No results for input(s): GLUCAP in the last 72 hours.  Wt Readings from Last 3 Encounters:  09/04/15 63.5 kg (139 lb 15.9 oz)  08/22/15 67 kg (147 lb 11.3 oz)  08/03/15 65.772 kg (145 lb)    Physical Exam:  Constitutional: She is oriented to person, place, and time. She appears well-developed and well-nourished. Looks alert    HENT:  Head: Normocephalic and atraumatic.  Right Ear: No decreased hearing is noted.  Left Ear: No decreased hearing is noted.  Mouth/Throat: Mucous membranes still dry. Debris on teeth as well..    Eyes: Conjunctivae are normal. Pupils are equal, round, and reactive to light.   Neck: Normal range of motion. Neck supple.  Cardiovascular: Normal rate and regular rhythm.  Respiratory: Effort normal and breath sounds normal. No respiratory distress. She has no wheezes.  GI: Soft. Bowel sounds are normal. She exhibits no distension. There is no tenderness. There is no rebound.  Musculoskeletal: low back tender with truncal movement and palpation  Neurological: She is sleeping, slow to arouse.   Left facial weakness. Limited insight and awareness. Skin: Skin is warm and dry.  Multiple scabbed areas bilateral feet improving but still present  Motor 4/5 UE's. LE's 2-3/5 at hips/knees partly due to pain. 4/5  ankles     Sensation intact to light touch in bilateral upper and lower limbs Skin: indurated area right forearm with dry dressing  Assessment/Plan: 1. Weakness, balance and cognitive deficits secondary to traumatic brain injury which require 3  hours per day of interdisciplinary therapy in a comprehensive inpatient rehab setting. (reduced to allow for fatigue,poor activity tolerance thus far) Physiatrist is providing close team supervision and 24 hour management of active medical problems listed below. Physiatrist and rehab team continue to assess barriers to discharge/monitor patient progress toward functional and medical goals.  Function:  Bathing Bathing position   Position: Shower  Bathing parts Body parts bathed by patient: Right arm, Left arm, Chest, Abdomen, Right upper leg, Left upper leg, Front perineal area Body parts bathed by helper: Buttocks, Right lower leg, Left lower leg, Back  Bathing assist Assist Level: Touching or steadying assistance(Pt > 75%)      Upper Body Dressing/Undressing Upper body dressing   What is the patient wearing?: Bra, Pull over shirt/dress Bra - Perfomed by patient: Thread/unthread right bra strap, Thread/unthread left bra strap Bra - Perfomed by helper: Hook/unhook bra (pull down sports bra) Pull over shirt/dress - Perfomed by patient: Thread/unthread right sleeve, Thread/unthread left sleeve, Put head through opening, Pull shirt over trunk Pull over shirt/dress - Perfomed by helper: Pull shirt over trunk        Upper body assist Assist Level: Touching or steadying assistance(Pt > 75%)   Set up :  To obtain clothing/put away  Lower Body Dressing/Undressing Lower body dressing   What is the patient wearing?: Pants, Shoes, Non-skid slipper socks   Underwear - Performed by helper: Thread/unthread right underwear leg, Thread/unthread left underwear leg, Pull underwear up/down Pants- Performed by patient: Thread/unthread right pants leg,  Thread/unthread left pants leg Pants- Performed by helper: Thread/unthread right pants leg, Thread/unthread left pants leg, Pull pants up/down   Non-skid slipper socks- Performed by helper: Don/doff right sock, Don/doff left sock       Shoes - Performed by helper: Don/doff right shoe, Don/doff left shoe, Fasten right, Fasten left     TED Hose - Performed by patient: Don/doff right TED hose, Don/doff left TED hose TED Hose - Performed by helper: Don/doff right TED hose, Don/doff left TED hose  Lower body assist Assist for lower body dressing: Touching or steadying assistance (Pt > 75%)      Toileting Toileting Toileting activity did not occur: No continent bowel/bladder event Toileting steps completed by patient: Adjust clothing prior to toileting Toileting steps completed by helper: Performs perineal hygiene, Adjust clothing after toileting Toileting Assistive Devices: Grab bar or rail  Toileting assist Assist level: Touching or steadying assistance (Pt.75%)   Transfers Chair/bed transfer   Chair/bed transfer method: Stand pivot Chair/bed transfer assist level: Maximal assist (Pt 25 - 49%/lift and lower) Chair/bed transfer assistive device: Armrests     Locomotion Ambulation Ambulation activity did not occur: Refused   Max distance: 47 Assist level: 2 helpers   Wheelchair   Type: Manual Max wheelchair distance: 150 Assist Level: Supervision or verbal cues  Cognition Comprehension Comprehension assist level: Understands basic 75 - 89% of the time/ requires cueing 10 - 24% of the time  Expression Expression assist level: Expresses basic 75 - 89% of the time/requires cueing 10 - 24% of the time. Needs helper to occlude trach/needs to repeat words.  Social Interaction Social Interaction assist level: Interacts appropriately 50 - 74% of the time - May be physically or verbally inappropriate.  Problem Solving Problem solving assist level: Solves basic 25 - 49% of the time - needs  direction more than half the time to initiate, plan or complete simple activities  Memory Memory assist level: Recognizes or recalls 25 - 49% of the time/requires cueing 50 - 75% of the time   Medical Problem List and Plan: 1. 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 DVT Prophylaxis/Anticoagulation: Right calf DVT without propagation---eliquis, asymptomatic    3. 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 Last [facility] 07/16/15.   -scheduled hydrocodone to help with activity tolerance     4. Mood: LCSW to follow for evaluation and support.  5. Neuropsych: This patient is not fully capable of making decisions on her own behalf.  -safety plan to prevent fall.   -resumed abilify QHS (per home regimen)for sundowning and sleep ---increased dose apparently worked last night along with other measure, but patient apparently had stopped using at home due to cost---will start seroquel again at  in its place  -dc foley as it seems to be causing some of agitation at night  -no trazodone at this time   6. Skin/Wound Care: Routine pressure relief measures. Maintain adequate nutrition and hyrdration status.  7. Fluids/Electrolytes/Nutrition: Continue Dysphagia 2 thins, fluid intake  ok, solid intake poor.  -added megace for appetite  8. Acute on chronic renal failure: continue to push po fluids--- re-check tomorrow 9. HTN: Monitor tid for now.  10. Acute on chronic Iron deficiency anemia: continue iron supplement.  11. Aspiration PNA/H flu PNA: Has completed antibiotic course.  12. 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 Continue amiodarone daily and lopressor bid. To follow up with [facility] after discharge.  13. Rectal  bleeding: Has resolved--without  recurrence. Will need follow up evaluation after discharge.  14. Depression: Used to take Celexa and Abilify--resumed abilify   15. Urinary retention?: foley out/ voiding trial  -100k E Coli-Sensitive to cefazolin , keflex completed---re-culture for resolution still pending 16. Cellulitis right forearm:  -area improved   LOS (Days) 14 A FACE TO FACE EVALUATION WAS PERFORMED  SWARTZ,Lisa [name] 09/05/2015 9:16 AM

## 2015-09-05 NOTE — Patient Care Conference (Signed)
Inpatient RehabilitationTeam Conference and Plan of Care Update Date: 09/04/2015   Time: 2:45 PM    Patient Name: Lisa [name]      Medical Record Number: [id number]  Date of Birth: 25-Apr-1927 Sex: Female         Room/Bed: 4W18C/4W18C-01 Payor Info: Payor: MEDICARE / Plan: MEDICARE PART A AND B / Product Type: *No Product type* /    Admitting Diagnosis: TBI after a fall  Admit Date/Time:  08/22/2015  2:45 PM Admission Comments: No comment available   Primary Diagnosis:  TBI (traumatic brain injury) (HCC) Principal Problem: TBI (traumatic brain injury) [facility])  Patient Active Problem List   Diagnosis Date Noted  . Bacterial UTI 08/27/2015  . DVT, lower extremity (HCC) 08/27/2015  . TBI (traumatic brain injury) (HCC) 08/22/2015  . ICH (intracerebral hemorrhage) (HCC)   . Abscess   . Hypomagnesemia   . SDH (subdural hematoma) (HCC)   . Boil of upper extremity   . Pneumonia due to Haemophilus influenzae (HCC)   . Chronic systolic CHF (congestive heart failure) (HCC)   . Acute on chronic renal failure (HCC)   . Hypokalemia   . Bright red blood per rectum   . Dysphagia   . Pressure ulcer 08/16/2015  . Acute respiratory failure with hypoxemia (HCC)   . Hypernatremia   . Subdural hematoma (HCC) 08/03/2015  . Lumbar radiculopathy 06/06/2015  . Spinal stenosis, lumbar region, with neurogenic claudication 04/02/2015  . Degenerative lumbar disc 02/15/2015  . Lumbar post-laminectomy syndrome 02/15/2015  . Facet syndrome, lumbar 02/15/2015  . Sacroiliac joint dysfunction 02/15/2015    Expected Discharge Date: Expected Discharge Date: 09/12/15 (likely to change to SNF)  Team Members Present: Physician leading conference: Dr. Faith Rogue Social Worker Present: Amada Jupiter, LCSW Nurse Present: Carmie End, RN PT Present: Teodoro Kil, PT;Rebecca Evelina Bucy, PT OT Present: Roney Mans, OT SLP Present: Feliberto Gottron, SLP PPS Coordinator present : Tora Duck, RN, CRRN      Current Status/Progress Goal Weekly Team Focus  Medical   tbi,cognition affected by UTI, inconsistent sleep. chronic pain. showing some improvement today  improve cooperation, mentation  sleep restoration, mental rx, familoy ed   Bowel/Bladder   Foley D/Ced, Incontinent bowel at times.  Continent, no retention  Monitor PVRs, OOB to BR for all toileting.   Swallow/Nutrition/ Hydration   Dys. 2 textures with thin liquids, Mod A for use of swallowing strategies   Supervision with least restrictive diet  tolerance of current diet, trials of upgraded textures    ADL's   functional transfers improved a great deal from total A +2 to mod-max A of 1, min A UB self care, mod-max LB self care, limited activity tolerance  min A overall  ADL retraining, activity tolerance, standing balance, functional mobility   Mobility   stand pivot transfers mod-max A, supervision-min A wheelchair mobility, supervision bed mobility, gait with mod A short distances with +2 for wheelchair follow, retropulsion, fear of falling  mod A transfers wheelchair level, gait with therapy only  participation, initiation, functional transfers, standing balance, gait, strengthening, activity tolerance, pt/family education   Communication             Safety/Cognition/ Behavioral Observations  Max A  min assist  attention, recall, awareness, problem solving    Pain   Chronic back pain, Norco effective. 1-2 daily   managed at goal 3/10.  Monitor for non-verbal S/S pain, increased restlessness, agitation.   Skin   Mild MASD to buttocks.  No  breakdown, injury this admission  Monitor.    Rehab Goals Patient on target to meet rehab goals: No Rehab Goals Revised: Making slow progress and may not meet all originally targeted goals *See Care Plan and progress notes for long and short-term goals.  Barriers to Discharge: age, tbi, ongoing cognitive deficits    Possible Resolutions to Barriers:  supervision/care at home     Discharge Planning/Teaching Needs:  Plan for pt to d/c to her home with 24/7 care being arranged (she does have a LTC policy) by son vs possible SNF.      Team Discussion:  Still with confusion.  Abilify resumed and UTI tx should be complete.  MD feels s/s of elderly woman with TBI most likely reason not witnessing much improvement.  Continue to address sleep.  Limited progress with mobility and do not anticipate her to be a functional ambulator due to cognitive deficits.  Poor po intake.  Mobility goals to decrease to mod assist.  SW to discuss possible SNF option with son.  Revisions to Treatment Plan:  Several goals to be downgraded/ discharged.  Dc plan may change to SNF.   Continued Need for Acute Rehabilitation Level of Care: The patient requires daily medical management by a physician with specialized training in physical medicine and rehabilitation for the following conditions: Daily direction of a multidisciplinary physical rehabilitation program to ensure safe treatment while eliciting the highest outcome that is of practical value to the patient.: Yes Daily medical management of patient stability for increased activity during participation in an intensive rehabilitation regime.: Yes Daily analysis of laboratory values and/or radiology reports with any subsequent need for medication adjustment of medical intervention for : Neurological problems;Other  Lisa [name] 09/05/2015, 4:28 PM

## 2015-09-05 NOTE — Progress Notes (Signed)
Physical Therapy Weekly Progress Note  Patient Details  Name: Lisa [name] MRN: [id number] Date of Birth: [birth year]  Beginning of progress report period: August 30, 2015 End of progress report period: September 05, 2015  Today's Date: 09/05/2015 PT Individual Time: 0800-0900 and 1445-1515 PT Individual Time Calculation (min): 60 min and 30 min  Patient has met 2 of 4 short term goals. Patient has made inconsistent progress this week but demonstrated excellent improvement with functional mobility this date. Patient currently demonstrating behaviors consistent with [place] Level VII and she requires supervision for bed mobility, min-max A for functional transfers, supervision wheelchair mobility, and short distance gait using RW with min-mod A and up to +2A for wheelchair follow for safety as patient will request to sit suddenly. Patient requires encouragement for all functional mobility. Patient and family education ongoing.   Patient continues to demonstrate the following deficits: muscle weakness, muscle joint tightness, decreased cardiorespiratory endurance, decreased motivation, decreased attention, decreased awareness, decreased problem solving, decreased safety awareness, decreased memory, delayed processing, decreased standing balance, decreased postural control and decreased balance strategies and therefore will continue to benefit from skilled PT intervention to enhance overall performance with activity tolerance, balance, postural control, ability to compensate for deficits, functional use of  right upper extremity, right lower extremity, left upper extremity and left lower extremity, attention and awareness.  Patient progressing toward long term goals.  Continue plan of care.  PT Short Term Goals Week 2:  PT Short Term Goal 1 (Week 2): Patient will perform bed mobility with supervision. PT Short Term Goal 1 - Progress (Week 2): Met PT Short Term Goal 2 (Week 2): Patient will transfer  wheelchair <> bed with consistent mod A.  PT Short Term Goal 2 - Progress (Week 2): Not met PT Short Term Goal 3 (Week 2): Patient will ambulate 10 feet with assist of 1 person and +2 for wheelchair follow. PT Short Term Goal 3 - Progress (Week 2): Met PT Short Term Goal 4 (Week 2): Patient will maintain standing balance x 1 min with assist of one person.  PT Short Term Goal 4 - Progress (Week 2): Not met Week 3:  PT Short Term Goal 1 (Week 3): = LTGs due to anticipated LOS  Skilled Therapeutic Interventions/Progress Updates:   Treatment 1: Patient awake in bed, agreeable to getting up to toilet. Transferred to edge of bed with supervision using rail and performed sit <> stand with min A for balance while patient pulled up pants, total A to don socks and shoes and thread pants. Patient performed stand pivot transfer to wheelchair with light mod A and greatly improved ability to pick up feet and back up to wheelchair with decreased retropulsion. In bathroom, patient performed stand pivot transfer to and from toilet using grab bar with min A. Patient managed clothing prior to toileting with min A for standing balance and required assist for thoroughness of hygiene and clothing management after toileting. Performed hand hygiene, brushed hair, and brushed teeth from wheelchair level at sink with setup assist and increased time. Patient's son [name] arrived and present for remainder of session. Patient propelled wheelchair x 130 ft with increased time and supervision. Gait training using RW 2 x 25 ft with min A overall and decreased retropulsion and patient able to steer/advance RW without assist this date. Patient performed stand pivot transfer using RW with cues for pushing up from arm rest with min A overall. Patient appeared brighter and more engaged throughout session and RN  reported that patient was able to sleep last night. Patient left sitting in wheelchair, handoff to SLP.   Treatment 2: Patient semi  reclined in bed visiting with friend and son. With increased time and encouragement, patient transferred to edge of bed with supervision and donned shoes with total A. Patient ambulated using RW from edge of bed out to hallway, turned, and ambulated to arm chair in room with min A overall approx 30 ft. Patient's lunch tray arrived late and patient agreeable to eating in day room. Patient ambulated from room to hallway using RW with min A approx 10 ft before refusing to walk further despite max multimodal cues and encouragement from therapist and family, patient shouting, "Both my legs are numb!" and "I've already gotten up twice today!" Education provided on purpose and goals of inpatient rehab, patient verbalized understanding. Patient propelled wheelchair using BUE then BLE the rest of way to day room with increased time on tiled and carpeted surfaces with supervision and cues for negotiating thresholds and obstacles on R. Patient left sitting in day room to eat lunch meal with son providing supervision.   Therapy Documentation Precautions:  Precautions Precautions: Fall Restrictions Weight Bearing Restrictions: No Pain: Pain Assessment Pain Assessment: No/denies pain   See Function Navigator for Current Functional Status.  Therapy/Group: Individual Therapy  Laretta Alstrom 09/05/2015, 10:50 AM

## 2015-09-05 NOTE — Progress Notes (Signed)
Speech Language Pathology Weekly Progress and Session Note  Patient Details  Name: Lisa [name] MRN: [id number] Date of Birth: 1927-04-24  Beginning of progress report period: August 29, 2015 End of progress report period: September 05, 2015  Today's Date: 09/05/2015 SLP Individual Time: 1594-7076 SLP Individual Time Calculation (min): 55 min  Short Term Goals: Week 2: SLP Short Term Goal 1 (Week 2): Patient will consume current diet with minimal overt s/s of aspiration with supervision verbal cues for use of swallow strategies.  SLP Short Term Goal 1 - Progress (Week 2): Met SLP Short Term Goal 2 (Week 2): Patient will sustain attention to a functional task for 5 minutes with Mod A verbal cues for redirection.  SLP Short Term Goal 2 - Progress (Week 2): Met SLP Short Term Goal 3 (Week 2): Patient will identify 2 physical and 2 cognitive deficits with Mod A question and semantic cues.  SLP Short Term Goal 3 - Progress (Week 2): Not met SLP Short Term Goal 4 (Week 2): Patient will initiate functional tasks with no more than 1 visual and 1 verbal cue in 75% of opportunities.  SLP Short Term Goal 4 - Progress (Week 2): Met SLP Short Term Goal 5 (Week 2): Patient will demonstrate functional problem solving for basic and familiar tasks with Mod A verbal cues.  SLP Short Term Goal 5 - Progress (Week 2): Met    New Short Term Goals: Week 3: SLP Short Term Goal 1 (Week 3): Patient will demonstrate efficient mastication with minimal oral residue with trials of Dys. 3 textures over 2 consecutive sessions prior to advancement with minimal overt s/s of aspiration and supervision verbal cues.   SLP Short Term Goal 2 (Week 3): Patient will selective attention to a functional task in a minimally distracting enviornment for 10 minutes with Mod A verbal cues for redirection.  SLP Short Term Goal 3 (Week 3): Patient will demonstrate functional problem solving for basic and familiar tasks with Min A verbal  cues.  SLP Short Term Goal 4 (Week 3): Patient will identify 2 physical and 2 cognitive deficits with Mod A question and semantic cues.  SLP Short Term Goal 5 (Week 3): Patient will utilize external memory aids to recall new, daily information with Mod A multimodal cues.   Weekly Progress Updates: Patient has made functional gains and has met 4 of 5 STG's this reporting period due to improved swallowing and cognitive function. Currently, patient is demonstrating behaviors consistent with a Rancho Level VI-emerging VII and requires Min A verbal cues for sustained attention to functional tasks and Mod A verbal cues for functional problem solving with basic and familiar tasks and Max A multimodal cues for recall of new information, awareness and overall safety. Patient is also consuming Dys. 2 textures with thin liquids with minimal overt s/s of aspiration and requires encouragement for PO intake due to decreased appetite. Patient is also consuming trials of Dys. 3 textures with intermittent overt s/s of aspiration, therefore, continued trials are needed at this time prior to advancement. Patient and family education is ongoing. Patient would benefit from continued skilled SLP intervention to maximize cognitive and swallowing function and overall functional independence prior to discharge.   Intensity: Minumum of 1-2 x/day, 30 to 90 minutes Frequency: 3 to 5 out of 7 days Duration/Length of Stay: 09/12/15 Treatment/Interventions: Cognitive remediation/compensation;Cueing hierarchy;Functional tasks;Environmental controls;Internal/external aids;Dysphagia/aspiration precaution training;Therapeutic Activities;Patient/family education   Daily Session  Skilled Therapeutic Interventions: Skilled treatment session focused on dysphagia and  cognitive goals. SLP facilitated session by providing set-up assist with breakfast meal of Dys. 2 textures with thin liquids. Patient with minimal PO intake but was Mod I for  use of small bites/sips without overt s/s of aspiration. Patient consumed trials of regular textures and required liquid washes to clear mild oral residue which resulted in intermittent overt s/s of aspiration, suspect due to premature spillage of liquids due to a mixed consistency. Recommend patient continue current diet with trials of upgraded textures with SLP only. Patient recalled events from previous therapy sessions with Min A question and verbal cues and transferred from the wheelchair to the bed with Min A verbal cues for safety. Patient continues to demonstrate intermittent confusion which can be difficult to redirect at times, however, son present today and patient was able to sleep during the night which increased patient's overall participation and cognitive function. Patient left supine in bed with son present and all needs within reach. Continue with current plan of care.        Function:   Eating Eating   Modified Consistency Diet: Yes Eating Assist Level: More than reasonable amount of time;Set up assist for   Eating Set Up Assist For: Opening containers       Cognition Comprehension Comprehension assist level: Understands basic 75 - 89% of the time/ requires cueing 10 - 24% of the time  Expression   Expression assist level: Expresses basic 75 - 89% of the time/requires cueing 10 - 24% of the time. Needs helper to occlude trach/needs to repeat words.  Social Interaction Social Interaction assist level: Interacts appropriately 50 - 74% of the time - May be physically or verbally inappropriate.  Problem Solving Problem solving assist level: Solves basic 25 - 49% of the time - needs direction more than half the time to initiate, plan or complete simple activities  Memory Memory assist level: Recognizes or recalls less than 25% of the time/requires cueing greater than 75% of the time   Pain No/Denies Pain   Therapy/Group: Individual Therapy  ,  09/05/2015, 12:52  PM         

## 2015-09-05 NOTE — Progress Notes (Signed)
Nutrition Follow-up  DOCUMENTATION CODES:   Not applicable  INTERVENTION:  Continue Ensure Enlive po TID, each supplement provides 350 kcal and 20 grams of protein.  Continue 30 ml Prostat po once daily, each supplement provides 100 kcal and 15 grams of protein.   Encourage adequate PO intake.   Food choices have been discussed.   NUTRITION DIAGNOSIS:   Inadequate oral intake related to poor appetite as evidenced by meal completion < 25%; ongoing  GOAL:   Patient will meet greater than or equal to 90% of their needs; progressing  MONITOR:   PO intake, Supplement acceptance, Diet advancement, Weight trends, I & O's, Labs, Skin  REASON FOR ASSESSMENT:   Malnutrition Screening Tool    ASSESSMENT:   81 y.o. Left handed female with history of HTN, depression, spinal stenosis with BLE weakness and post laminectomy syndrome with chronic pain who was admitted on 08/03/15 after a fall in parking lot and subsequent acute traumatic SAH along frontal convexities and acute SDH with confusion and waxing and waining of mental status.  She continues to have bouts of lethargy and follow up CT 11/27 showed overall improvement with near resolution of SAH and bifrontal parenchymal hemorrhage as well as subtle new 3 mm midline shift.  RD consulted for food choices. Encouraged and educated pt and son at bedside to order foods from menu that pt prefers to eat. Pt's appetite is fine however reports portions at meals are too large for her. Pt encouraged to eat what she can at meals. RD to continue with current ordered nutritional supplements to aid in caloric and protein needs.   Diet Order:  DIET DYS 2 Room service appropriate?: Yes; Fluid consistency:: Thin  Skin:  Wound (see comment) (Stage II pressure ulcer on coccyx)  Last BM:  12/13  Height:   Ht Readings from Last 1 Encounters:  08/22/15 5\' 3"  (1.6 m)    Weight:   Wt Readings from Last 1 Encounters:  09/04/15 139 lb 15.9 oz (63.5  kg)    Ideal Body Weight:  52.2 kg  BMI:  Body mass index is 24.8 kg/(m^2).  Estimated Nutritional Needs:   Kcal:  1600-1800  Protein:  75-85 grams  Fluid:  1.6 -1.8 L/day  EDUCATION NEEDS:   No education needs identified at this time  Roslyn SmilingStephanie Geoge Lawrance, MS, RD, LDN Pager # [phone] After hours/ weekend pager # [phone]

## 2015-09-05 NOTE — Progress Notes (Signed)
Occupational Therapy Session Note  Patient Details  Name: Lisa SnellenFaye K [name] MRN: [id number] Date of Birth: 09/06/1927  Today's Date: 09/05/2015 OT Individual Time: 1300-1400 OT Individual Time Calculation (min): 60 min    Short Term Goals: Week 2:  OT Short Term Goal 1 (Week 2): Pt will engage in functional self care task for 10 mins or more with 1 rest break or less OT Short Term Goal 2 (Week 2): Pt will perform LB dressing tasks with max A sit<>stand OT Short Term Goal 3 (Week 2): Pt will perform toilet transfers with mod A stand pivot OT Short Term Goal 4 (Week 2): Pt will perform toileting tasks with mod A  Skilled Therapeutic Interventions/Progress Updates:    Pt seen for OT therapy session focusing on LB dressing, functional transfers, and education. Pt in supine upon arrival, requring increased encouragement to participate. She transferred supine> EOB with increase time using hospit[facility] bed functions. She required increased time and encouragement to complete all tasks. She sat EOB to don B shoes. Demonstration and max VCs provided for donning technique (bringing leg onto side of bed). She transferred with mod A via stand pivot to w/c. She self propelled w/c part of way to therapy gym using B UEs and LEs. Pt sat on EOM, set up with laundry folding activity and instructed to stand to complete task. Pt became tearful stating she didn't know why she was here. Empathetic listening provided and reoriented pt to situation. She completed 75% of task in sitting, and then stood with steadying assist to complete remainder of task. She returned to room total A. In room, she completed toileting task, mod A for transfers and +2 available to assist with hygiene and clothing management. Pt impulsive, standing without assist while materials were being gathered. She returned to bed at end of session, left in supine with all needs in reach and RN present. Pt with various complaints throughout session regarding pain  and nausea. Still able to complete therapy session.  Pt required increased time, encouragement and cues to complete all tasks. She is physically able to do more than she believes and is willing to attempt.   Therapy Documentation Precautions:  Precautions Precautions: Fall Restrictions Weight Bearing Restrictions: No  See Function Navigator for Current Functional Status.   Therapy/Group: Individual Therapy  Lewis, Torunn Chancellor C 09/05/2015, 7:29 AM

## 2015-09-06 ENCOUNTER — Inpatient Hospital Stay (HOSPITAL_COMMUNITY): Payer: Medicare Other | Admitting: Occupational Therapy

## 2015-09-06 ENCOUNTER — Inpatient Hospital Stay (HOSPITAL_COMMUNITY): Payer: Medicare Other | Admitting: Speech Pathology

## 2015-09-06 ENCOUNTER — Inpatient Hospital Stay (HOSPITAL_COMMUNITY): Payer: Medicare Other | Admitting: Physical Therapy

## 2015-09-06 LAB — BASIC METABOLIC PANEL
Anion gap: 10 (ref 5–15)
BUN: 14 mg/dL (ref 6–20)
CHLORIDE: 96 mmol/L — AB (ref 101–111)
CO2: 28 mmol/L (ref 22–32)
CREATININE: 1.41 mg/dL — AB (ref 0.44–1.00)
Calcium: 8.7 mg/dL — ABNORMAL LOW (ref 8.9–10.3)
GFR calc Af Amer: 38 mL/min — ABNORMAL LOW (ref >60)
GFR calc non Af Amer: 33 mL/min — ABNORMAL LOW (ref >60)
Glucose, Bld: 92 mg/dL (ref 65–99)
Potassium: 3.6 mmol/L (ref 3.5–5.1)
SODIUM: 134 mmol/L — AB (ref 135–145)

## 2015-09-06 MED ORDER — PANTOPRAZOLE SODIUM 20 MG PO TBEC
20.0000 mg | DELAYED_RELEASE_TABLET | Freq: Two times a day (BID) | ORAL | Status: DC
Start: 2015-09-06 — End: 2015-09-12
  Administered 2015-09-06 – 2015-09-12 (×12): 20 mg via ORAL
  Filled 2015-09-06 (×12): qty 1

## 2015-09-06 MED ORDER — AMIODARONE HCL 100 MG PO TABS
100.0000 mg | ORAL_TABLET | Freq: Every day | ORAL | Status: DC
  Administered 2015-09-07 – 2015-09-12 (×6): 100 mg via ORAL
  Filled 2015-09-06 (×7): qty 1

## 2015-09-06 MED ORDER — FLUCONAZOLE 100 MG PO TABS
100.0000 mg | ORAL_TABLET | Freq: Every day | ORAL | Status: AC
  Administered 2015-09-06 – 2015-09-08 (×3): 100 mg via ORAL
  Filled 2015-09-06 (×3): qty 1

## 2015-09-06 NOTE — Progress Notes (Signed)
Occupational Therapy Weekly Progress Note  Patient Details  Name: Lisa [name] MRN: [id number] Date of Birth: 04-Jul-1927  Beginning of progress report period: August 30, 2015 End of progress report period: September 06, 2015  Today's Date: 09/06/2015 OT Individual Time: 2707-8675 OT Individual Time Calculation (min): 45 min  and Today's Date: 09/06/2015 OT Missed Time: 15 Minutes Missed Time Reason: Patient fatigue   Patient has met 4 of 4 short term goals.  Pt has been progressing well physically in that she can now stand up with slight guiding A to supervision and ambulate with RW short distances. She is also initiating well with her self care.   Pt is often focused on what she can not do and tends to assume she can not do a task before she tries it.   Patient continues to demonstrate the following deficits: low activity tolerance, decreased LE strength, decreased dynamic balance and therefore will continue to benefit from skilled OT intervention to enhance overall performance with BADL.  Patient progressing toward long term goals..  Continue plan of care.  OT Short Term Goals Week 1:  OT Short Term Goal 1 (Week 1): Pt will be engaged in 10 minutes of functional self care tasks with requiring less than 1 rest break . OT Short Term Goal 1 - Progress (Week 1): Progressing toward goal OT Short Term Goal 2 (Week 1): Pt will perform LB dressing with max A in order to decrease level of assist needed with self care. OT Short Term Goal 2 - Progress (Week 1): Progressing toward goal OT Short Term Goal 3 (Week 1): Pt will perform UB dressing with min A in order to decrease level of assist needed with self care. OT Short Term Goal 3 - Progress (Week 1): Met OT Short Term Goal 4 (Week 1): Pt will perform toilet transfer with mod A in order to increase I with functional transfers. OT Short Term Goal 4 - Progress (Week 1): Progressing toward goal Week 2:  OT Short Term Goal 1 (Week 2): Pt will  engage in functional self care task for 10 mins or more with 1 rest break or less OT Short Term Goal 1 - Progress (Week 2): Met OT Short Term Goal 2 (Week 2): Pt will perform LB dressing tasks with max A sit<>stand OT Short Term Goal 2 - Progress (Week 2): Met OT Short Term Goal 3 (Week 2): Pt will perform toilet transfers with mod A stand pivot OT Short Term Goal 3 - Progress (Week 2): Met OT Short Term Goal 4 (Week 2): Pt will perform toileting tasks with mod A OT Short Term Goal 4 - Progress (Week 2): Met Week 3:  OT Short Term Goal 1 (Week 3): STGs = LTGs    Skilled Therapeutic Interventions/Progress Updates:    At start of session, pt in bed under the covers. "They told me I was allowed to take a nap and that I did not have to get up again".  Reoriented pt to therapy schedule. Pt continued to refuse to get up, so negotiated with her to allow her to nap for 15 minutes.  After 15 minutes, pt agreeable to getting up. She sat to EOB without difficulty and stood to RW with close S. Pt ambulated 5 feet with S and then stated "I am going to fall!". Encouraged pt therapist could support her and that her balance was stable. Pt refused to keep walking so w/c brought up behind pt to allow her to  sit. Pt completed the rest of the ADL transfers with stand pivots only requiring S.  She pulled pants up with only 10% A  And donned shirt without A.  A with socks and shoes. Quick release belt applied and pt in room with nurse.   Therapy Documentation Precautions:  Precautions Precautions: Fall Restrictions Weight Bearing Restrictions: No General OT Amount of Missed Time: 15 Minutes  Pain: no c/o pain   ADL: See Function Navigator for Current Functional Status.   Therapy/Group: Individual Therapy  [place] 09/06/2015, 12:19 PM

## 2015-09-06 NOTE — Plan of Care (Signed)
Problem: RH BLADDER ELIMINATION Goal: RH STG MANAGE BLADDER WITH EQUIPMENT WITH ASSISTANCE STG Manage Bladder With Equipment With mod Assistance  Outcome: Progressing I&O cath at times when unable to void

## 2015-09-06 NOTE — Progress Notes (Signed)
Physical Therapy Session Note  Patient Details  Name: Lisa SnellenFaye K Ploeger MRN: [id number] Date of Birth: 05/09/1927  Today's Date: 09/06/2015 PT Individual Time: 0800-0900 and 1245-1315 PT Individual Time Calculation (min): 60 min and 30 min   Short Term Goals: Week 3:  PT Short Term Goal 1 (Week 3): = LTGs due to anticipated LOS  Skilled Therapeutic Interventions/Progress Updates:   Treatment 1: Patient asleep in bed upon arrival, requiring increased time to sit up on edge of bed with max encouragement with supervision using rail and washcloth provided to patient to wash face. Donned socks and shoes with total A seated edge of bed. Eventually patient agreeable to ambulate to bathroom. Patient stood from edge of bed and ambulated to and from bathroom using RW 2 x 20 ft with min A. Patient managed clothing prior to toileting with min A for balance and required assist for thoroughness of hygiene and clothing management after toileting. Patient stood at sink for hand hygiene with min A and returned to edge of bed to eat breakfast. Patient sat edge of bed x 10 min to consume meal of Dys 2 textures and thin liquids with no overt signs/symptoms of aspiration before complaining of nausea and requesting to lie down. Patient provided supine rest break and discussed nausea with RN. Patient agreeable to return tot sitting and ate 5 more bites of breakfast. Patient left sitting edge of bed with NT and son present.   Treatment 2: Patient resting in recliner with son present for session. Patient required assistance to recall "deal" made with therapist yesterday to walk halfway to day room this date. Donned shoes with total A. Patient performed sit <> stand from recliner and ambulated using RW x 75 ft from bedside to RN station with min A overall. Patient required max cues throughout session to take hands off RW and reach back for chair with stand > sit, min A. Patient negotiated up/down 8 (3") stairs and 4 (6") stairs  using 2 rails with increased time and step-to pattern with min A overall and required prolonged seated rest break before ambulating back over steps. Patient requested to return to bed, performed stand pivot transfer with min A and left semi reclined in bed with needs within reach and son present. Patient demonstrated improved participation and engagement with therapy this date resulting in improved functional mobility outcomes with minimal encouragement.   Therapy Documentation Precautions:  Precautions Precautions: Fall Restrictions Weight Bearing Restrictions: No Pain:  Denies pain   See Function Navigator for Current Functional Status.   Therapy/Group: Individual Therapy  Kerney ElbeVarner, Ziah Turvey A 09/06/2015, 8:47 AM

## 2015-09-06 NOTE — Progress Notes (Signed)
Speech Language Pathology Daily Session Note  Patient Details  Name: Lisa SnellenFaye K Racca MRN: [id number] Date of Birth: 06/18/1927  Today's Date: 09/06/2015 SLP Individual Time: 1100-1158 SLP Individual Time Calculation (min): 58 min  Short Term Goals: Week 3: SLP Short Term Goal 1 (Week 3): Patient will demonstrate efficient mastication with minimal oral residue with trials of Dys. 3 textures over 2 consecutive sessions prior to advancement with minimal overt s/s of aspiration and supervision verbal cues.   SLP Short Term Goal 2 (Week 3): Patient will selective attention to a functional task in a minimally distracting enviornment for 10 minutes with Mod A verbal cues for redirection.  SLP Short Term Goal 3 (Week 3): Patient will demonstrate functional problem solving for basic and familiar tasks with Min A verbal cues.  SLP Short Term Goal 4 (Week 3): Patient will identify 2 physical and 2 cognitive deficits with Mod A question and semantic cues.  SLP Short Term Goal 5 (Week 3): Patient will utilize external memory aids to recall new, daily information with Mod A multimodal cues.   Skilled Therapeutic Interventions: Skilled treatment session focused on cognitive and dysphagia goals. SLP facilitated session by providing Mod A question cues for recall of events from previous therapy sessions and Min A question cues for orientation to date. Patient demonstrated increased emergent awareness of her intermittent confusion/confabulation by reporting she "knows that at times she thinks certain things are true when they actually aren't" but was unable to self-monitor and correct in the moment (confabulating about the president of the hospital). SLP also facilitated session by providing supervision verbal cues for use of small bites/sips with trial tray of Dys. 3 textures with thin liquids. Patient demonstrated efficient mastication with minimal oral residue that patient independently cleared with a liquid wash,  therefore, recommend patient upgrade to Dys. 3 textures and continue full supervision to maximize safety. Patient's son present and verbalized understanding. Patient left in recliner with all needs within reach. Continue with current plan of care.    Function:  Eating Eating   Modified Consistency Diet: Yes Eating Assist Level: More than reasonable amount of time;Set up assist for   Eating Set Up Assist For: Opening containers       Cognition Comprehension Comprehension assist level: Understands basic 75 - 89% of the time/ requires cueing 10 - 24% of the time  Expression   Expression assist level: Expresses basic 75 - 89% of the time/requires cueing 10 - 24% of the time. Needs helper to occlude trach/needs to repeat words.  Social Interaction Social Interaction assist level: Interacts appropriately 50 - 74% of the time - May be physically or verbally inappropriate.  Problem Solving Problem solving assist level: Solves basic 25 - 49% of the time - needs direction more than half the time to initiate, plan or complete simple activities  Memory Memory assist level: Recognizes or recalls 25 - 49% of the time/requires cueing 50 - 75% of the time    Pain Headache, RN made aware and administered medications   Therapy/Group: Individual Therapy  Sharmin Foulk 09/06/2015, 4:15 PM

## 2015-09-06 NOTE — Progress Notes (Signed)
[place] PHYSICAL MEDICINE & REHABILITATION     PROGRESS NOTE    Subjective/Complaints: Seems to have slept fairly well. Up at [facility] with therapy this morning. Mild back pain.   ROS limited due to cognitive/communication deficits  Objective: Vital Signs: Blood pressure 142/60, pulse 72, temperature 98 F (36.7 C), temperature source Oral, resp. rate 18, height  (1.6 m), weight 64.02 kg (141 lb 2.2 oz), SpO2 97 %. No results found.  Recent Labs  09/04/15 0631  WBC 6.6  HGB 10.3*  HCT 33.3*  PLT 291    Recent Labs  09/04/15 0631 09/06/15 0654  NA 132* 134*  K 4.2 3.6  CL 95* 96*  GLUCOSE 91 92  BUN 14 14  CREATININE 1.34* 1.41*  CALCIUM 8.9 8.7*   CBG (last 3)  No results for input(s): GLUCAP in the last 72 hours.  Wt Readings from Last 3 Encounters:  09/06/15 64.02 kg (141 lb 2.2 oz)  08/22/15 67 kg (147 lb 11.3 oz)  08/03/15 65.772 kg (145 lb)    Physical Exam:  Constitutional: She is oriented to person, place, and time. She appears well-developed and well-nourished. Looks alert    HENT:  Head: Normocephalic and atraumatic.  Right Ear: No decreased hearing is noted.  Left Ear: No decreased hearing is noted.  Mouth/Throat: Mucous membranes still dry. Debris on teeth as well..    Eyes: Conjunctivae are normal. Pupils are equal, round, and reactive to light.   Neck: Normal range of motion. Neck supple.  Cardiovascular: Normal rate and regular rhythm.  Respiratory: Effort normal and breath sounds normal. No respiratory distress. She has no wheezes.  GI: Soft. Bowel sounds are normal. She exhibits no distension. There is no tenderness. There is no rebound.  Musculoskeletal: low back tender with truncal movement and palpation  Neurological: She is alert, impaired memory.   Left facial weakness. Limited insight and awareness. Skin: Skin is warm and dry.  Multiple scabbed areas bilateral feet improving   Motor 4/5 UE's. LE's 2-3/5 at hips/knees  partly due to pain. 4/5 ankles     Sensation intact to light touch in bilateral upper and lower limbs Skin: indurated area right forearm with dry dressing  Assessment/Plan: 1. Weakness, balance and cognitive deficits secondary to traumatic brain injury which require 3  hours per day of interdisciplinary therapy in a comprehensive inpatient rehab setting. (reduced to allow for fatigue,poor activity tolerance thus far) Physiatrist is providing close team supervision and 24 hour management of active medical problems listed below. Physiatrist and rehab team continue to assess barriers to discharge/monitor patient progress toward functional and medical goals.  Function:  Bathing Bathing position   Position: Shower  Bathing parts Body parts bathed by patient: Right arm, Left arm, Chest, Abdomen, Right upper leg, Left upper leg, Front perineal area Body parts bathed by helper: Buttocks, Right lower leg, Left lower leg, Back  Bathing assist Assist Level: Touching or steadying assistance(Pt > 75%)      Upper Body Dressing/Undressing Upper body dressing   What is the patient wearing?: Bra, Pull over shirt/dress Bra - Perfomed by patient: Thread/unthread right bra strap, Thread/unthread left bra strap Bra - Perfomed by helper: Hook/unhook bra (pull down sports bra) Pull over shirt/dress - Perfomed by patient: Thread/unthread right sleeve, Thread/unthread left sleeve, Put head through opening, Pull shirt over trunk Pull over shirt/dress - Perfomed by helper: Pull shirt over trunk        Upper body assist Assist Level: Touching or  steadying assistance(Pt > 75%)   Set up : To obtain clothing/put away  Lower Body Dressing/Undressing Lower body dressing   What is the patient wearing?: Pants, Shoes, Non-skid slipper socks   Underwear - Performed by helper: Thread/unthread right underwear leg, Thread/unthread left underwear leg, Pull underwear up/down Pants- Performed by patient: Thread/unthread  right pants leg, Thread/unthread left pants leg Pants- Performed by helper: Thread/unthread right pants leg, Thread/unthread left pants leg, Pull pants up/down   Non-skid slipper socks- Performed by helper: Don/doff right sock, Don/doff left sock       Shoes - Performed by helper: Don/doff right shoe, Don/doff left shoe, Fasten right, Fasten left     TED Hose - Performed by patient: Don/doff right TED hose, Don/doff left TED hose TED Hose - Performed by helper: Don/doff right TED hose, Don/doff left TED hose  Lower body assist Assist for lower body dressing: Touching or steadying assistance (Pt > 75%)      Toileting Toileting Toileting activity did not occur: No continent bowel/bladder event Toileting steps completed by patient: Adjust clothing prior to toileting Toileting steps completed by helper: Performs perineal hygiene, Adjust clothing after toileting Toileting Assistive Devices: Grab bar or rail  Toileting assist Assist level: Touching or steadying assistance (Pt.75%)   Transfers Chair/bed transfer   Chair/bed transfer method: Ambulatory Chair/bed transfer assist level: Touching or steadying assistance (Pt > 75%) Chair/bed transfer assistive device: Patent attorneyWalker     Locomotion Ambulation Ambulation activity did not occur: Refused   Max distance: 20 Assist level: Touching or steadying assistance (Pt > 75%)   Wheelchair   Type: Manual Max wheelchair distance: 130 Assist Level: Supervision or verbal cues  Cognition Comprehension Comprehension assist level: Understands basic 75 - 89% of the time/ requires cueing 10 - 24% of the time  Expression Expression assist level: Expresses basic 75 - 89% of the time/requires cueing 10 - 24% of the time. Needs helper to occlude trach/needs to repeat words.  Social Interaction Social Interaction assist level: Interacts appropriately 50 - 74% of the time - May be physically or verbally inappropriate.  Problem Solving Problem solving assist  level: Solves basic 25 - 49% of the time - needs direction more than half the time to initiate, plan or complete simple activities  Memory Memory assist level: Recognizes or recalls 25 - 49% of the time/requires cueing 50 - 75% of the time   Medical Problem List and Plan: 1. 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 DVT Prophylaxis/Anticoagulation: Right calf DVT without propagation---eliquis, asymptomatic    3. 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 [facility]   -scheduled hydrocodone to help with activity tolerance     4. Mood: LCSW to follow for evaluation and support.  5. Neuropsych: This patient is not fully capable of making decisions on her own behalf.  -safety plan to prevent fall.   -seroquel 50mg  qhs  -foley removed this week  -no trazodone at this time   6. Skin/Wound Care: Routine pressure relief measures. Maintain adequate nutrition and hyrdration status.  7. Fluids/Electrolytes/Nutrition: Continue Dysphagia 2 thins, fluid intake ok, solid intake poor.  -added megace for appetite  8. Acute on chronic renal failure: continue to push po fluids--- labs all reviewed. Holding fairly steady 9. HTN: Monitor tid for  now.  10. Acute on chronic Iron deficiency anemia: continue iron supplement.  11. Aspiration PNA/H flu PNA: Has completed antibiotic course.  12. 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 Continue amiodarone daily and lopressor bid. To follow up with [facility] after discharge.  13. Rectal bleeding: Has resolved--without  recurrence. Will need follow up evaluation after discharge.  14. Depression: Used to take Celexa and Abilify--resumed abilify   15. Urinary retention?: foley  out/ voiding trial  -100k E Coli-Sensitive to cefazolin , keflex completed---re-culture 100k yeast---diflucan 16. Cellulitis right forearm:  -area improved   LOS (Days) 15 A FACE TO FACE EVALUATION WAS PERFORMED  Lisa [name] T 09/06/2015 8:46 AM

## 2015-09-07 ENCOUNTER — Inpatient Hospital Stay (HOSPITAL_COMMUNITY): Payer: Medicare Other | Admitting: Physical Therapy

## 2015-09-07 ENCOUNTER — Inpatient Hospital Stay (HOSPITAL_COMMUNITY): Payer: Medicare Other | Admitting: Occupational Therapy

## 2015-09-07 ENCOUNTER — Inpatient Hospital Stay (HOSPITAL_COMMUNITY): Payer: Medicare Other | Admitting: Speech Pathology

## 2015-09-07 ENCOUNTER — Inpatient Hospital Stay (HOSPITAL_COMMUNITY): Payer: Medicare Other

## 2015-09-07 NOTE — Progress Notes (Signed)
Physical Therapy Session Note  Patient Details  Name: Lisa [name] MRN: [id number] Date of Birth: 03/23/1927  Today's Date: 09/07/2015 PT Individual Time: 0900-1000 PT Individual Time Calculation (min): 60 min   Short Term Goals: Week 3:  PT Short Term Goal 1 (Week 3): = LTGs due to anticipated LOS  Skilled Therapeutic Interventions/Progress Updates:   Session focused on progression of functional mobility with gait, stairs, and transfers and standing tolerance. Patient sitting in wheelchair with son present for session. Patient propelled wheelchair using BUE x 130 ft with supervision and increased time. Patient performed simulated car transfer to sedan height using RW with supervision overall except min [name] for sit > stand from low car seat and initial demonstration of technique. Patient ambulated using RW x 40 ft with supervision before requesting to sit down. Patient worked on standing tolerance/balance while engaged in 2 rounds of horseshoes with min [name] to throw 7 horseshoes at [name] time before requesting to sit. Patient ambulated using RW x 85 ft including 2 turns with close supervision and increased time with 1 standing rest break. Patient negotiated up/down 4 (6") stairs using 2 rails with mod [name] overall and step-to pattern with standing rest break at top of stairs due to SOB. Patient returned to room and left sitting with all needs within reach and son present. At end of session, patient's son voicing concerns regarding patient suffering from depression, emotional support provided and treatment team aware.   Therapy Documentation Precautions:  Precautions Precautions: Fall Restrictions Weight Bearing Restrictions: No Pain: Pain Assessment Pain Assessment: Faces Faces Pain Scale: Hurts [name] little bit Pain Type: Acute pain Pain Location: Toe (Comment which one) Pain Orientation: Right;Left Pain Descriptors / Indicators: Sore Pain Onset: On-going Pain Intervention(s): Ambulation/increased  activity;Emotional support   See Function Navigator for Current Functional Status.   Therapy/Group: Individual Therapy  Kerney ElbeVarner, Lisa [name] 09/07/2015, 9:56 AM

## 2015-09-07 NOTE — Progress Notes (Signed)
Speech Language Pathology Daily Session Note  Patient Details  Name: Lisa [name] MRN: [id number] Date of Birth: 05/26/1927  Today's Date: 09/07/2015 SLP Individual Time: [id number] SLP Individual Time Calculation (min): 55 min  Short Term Goals: Week 3: SLP Short Term Goal 1 (Week 3): Patient will demonstrate efficient mastication with minimal oral residue with trials of Dys. 3 textures over 2 consecutive sessions prior to advancement with minimal overt s/s of aspiration and supervision verbal cues.   SLP Short Term Goal 2 (Week 3): Patient will selective attention to a functional task in a minimally distracting enviornment for 10 minutes with Mod A verbal cues for redirection.  SLP Short Term Goal 3 (Week 3): Patient will demonstrate functional problem solving for basic and familiar tasks with Min A verbal cues.  SLP Short Term Goal 4 (Week 3): Patient will identify 2 physical and 2 cognitive deficits with Mod A question and semantic cues.  SLP Short Term Goal 5 (Week 3): Patient will utilize external memory aids to recall new, daily information with Mod A multimodal cues.   Skilled Therapeutic Interventions: Skilled treatment session focused on cognitive and dysphagia goals. Upon arrival, patient was asleep while supine in bed but was easily awakened to voice. SLP facilitated session by providing extra time and Min A verbal cues for safety with transfer from the bed to the wheelchair and from the wheelchair to the commode. Patient was oriented X 4 today and did not show any signs of confusion and appeared engaged with the clinician throughout the session. SLP also provided supervision verbal cues for patient to utilize small sips with thin liquids with breakfast meal of Dys. 3 textures with thin liquids. Patient consumed meal with intermittent wet vocal quality that she independently cleared with a throat clear, however, difficult to differentiate between possible penetration vs. mucous due to  hoarse vocal quality. Patient also demonstrated selective attention to self-feeding in a minimally distracting environment for 10 minutes with Min A verbal cues for redirection. Patient left upright in wheelchair with all needs within reach and son present. Continue with current plan of care.    Function:  Eating Eating   Modified Consistency Diet: Yes Eating Assist Level: More than reasonable amount of time;Supervision or verbal cues   Eating Set Up Assist For: Opening containers       Cognition Comprehension Comprehension assist level: Understands basic 75 - 89% of the time/ requires cueing 10 - 24% of the time  Expression   Expression assist level: Expresses basic 75 - 89% of the time/requires cueing 10 - 24% of the time. Needs helper to occlude trach/needs to repeat words.  Social Interaction Social Interaction assist level: Interacts appropriately 50 - 74% of the time - May be physically or verbally inappropriate.  Problem Solving Problem solving assist level: Solves basic 50 - 74% of the time/requires cueing 25 - 49% of the time  Memory Memory assist level: Recognizes or recalls 50 - 74% of the time/requires cueing 25 - 49% of the time    Pain Pain Assessment Pain Assessment: No/denies pain   Therapy/Group: Individual Therapy  Wayne Brunker 09/07/2015, 11:13 AM

## 2015-09-07 NOTE — Progress Notes (Signed)
Occupational Therapy Session Note  Patient Details  Name: Lisa [name] MRN: [id number] Date of Birth: Mar 18, 1927  Today's Date: 09/07/2015 OT Individual Time: 1030-1130 OT Individual Time Calculation (min): 60 min    Short Term Goals: Week 1:  OT Short Term Goal 1 (Week 1): Pt will be engaged in 10 minutes of functional self care tasks with requiring less than 1 rest break . OT Short Term Goal 1 - Progress (Week 1): Progressing toward goal OT Short Term Goal 2 (Week 1): Pt will perform LB dressing with max A in order to decrease level of assist needed with self care. OT Short Term Goal 2 - Progress (Week 1): Progressing toward goal OT Short Term Goal 3 (Week 1): Pt will perform UB dressing with min A in order to decrease level of assist needed with self care. OT Short Term Goal 3 - Progress (Week 1): Met OT Short Term Goal 4 (Week 1): Pt will perform toilet transfer with mod A in order to increase I with functional transfers. OT Short Term Goal 4 - Progress (Week 1): Progressing toward goal Week 2:  OT Short Term Goal 1 (Week 2): Pt will engage in functional self care task for 10 mins or more with 1 rest break or less OT Short Term Goal 1 - Progress (Week 2): Met OT Short Term Goal 2 (Week 2): Pt will perform LB dressing tasks with max A sit<>stand OT Short Term Goal 2 - Progress (Week 2): Met OT Short Term Goal 3 (Week 2): Pt will perform toilet transfers with mod A stand pivot OT Short Term Goal 3 - Progress (Week 2): Met OT Short Term Goal 4 (Week 2): Pt will perform toileting tasks with mod A OT Short Term Goal 4 - Progress (Week 2): Met Week 3:  OT Short Term Goal 1 (Week 3): STGs = LTGs    Skilled Therapeutic Interventions/Progress Updates:    Pt seen for skilled OT to facilitate cognitive skills of divided attention, problem solving, memory and functional mobility and activity tolerance to improve her independence with self care.  Pt received in recliner and when asked if she  would like to shower today, pt declined and was upset that "so much is being asked of her".  Reviewed pt's progress and goals, but she stated she just wanted to rest. Pt's son present and then left to allow pt to engage in self care. Pt also expressed extreme frustration that everyone tries to rush her.  Pt allowed a great deal of extra time to initiate tasks when she was ready. When encouraged to walk over to her dresser to get her clothing out, pt quite annoyed that her clothes were not handed to her. Despite encouragement to get up, pt was handed her clothing.  Pt eventually changed her clothing with only min A with pants, ambulated to toilet with RW with very close S, toileted with min A, then walked out of bathroom. Pt stated she had to sit immediately. W/c brought behind pt and she collapsed into w/c. Prior to that pt was able to sit><stand at least 6x all with S.  Pt sat at sink to complete grooming.  She wanted to get into bed but was not willing to try walking again.  Her w/c was brought to EOB and pt completed a stand pivot transfer with min A. Pt was able to lay down with S. Pt in bed with all needs met.    Therapy Documentation Precautions:  Precautions  Precautions: Fall Restrictions Weight Bearing Restrictions: No  Vital Signs: Therapy Vitals Pulse Rate: 91 BP: (!) 139/54 mmHg Patient Position (if appropriate): Sitting Oxygen Therapy SpO2: 98 % O2 Device: Not Delivered Pain: Pain Assessment Pain Assessment: No/denies pain  ADL:  See Function Navigator for Current Functional Status.   Therapy/Group: Individual Therapy  Lisa [name] 09/07/2015, 11:47 AM

## 2015-09-07 NOTE — Progress Notes (Signed)
[place] PHYSICAL MEDICINE & REHABILITATION     PROGRESS NOTE    Subjective/Complaints: Slept well last night. Just waking up when i arrived this morning. Getting up to toilet to empty bladder per rn   ROS limited due to cognitive/communication deficits  Objective: Vital Signs: Blood pressure 128/47, pulse 79, temperature 98.7 F (37.1 C), temperature source Oral, resp. rate 16, height  (1.6 m), weight 67.6 kg (149 lb 0.5 oz), SpO2 97 %. No results found. No results for input(s): WBC, HGB, HCT, PLT in the last 72 hours.  Recent Labs  09/06/15 0654  NA 134*  K 3.6  CL 96*  GLUCOSE 92  BUN 14  CREATININE 1.41*  CALCIUM 8.7*   CBG (last 3)  No results for input(s): GLUCAP in the last 72 hours.  Wt Readings from Last 3 Encounters:  09/07/15 67.6 kg (149 lb 0.5 oz)  08/22/15 67 kg (147 lb 11.3 oz)  08/03/15 65.772 kg (145 lb)    Physical Exam:  Constitutional: She is oriented to person, place, and time. She appears well-developed and well-nourished. Looks alert    HENT:  Head: Normocephalic and atraumatic.  Right Ear: No decreased hearing is noted.  Left Ear: No decreased hearing is noted.  Mouth/Throat: Mucous membranes still dry. Debris on teeth as well..    Eyes: Conjunctivae are normal. Pupils are equal, round, and reactive to light.   Neck: Normal range of motion. Neck supple.  Cardiovascular: Normal rate and regular rhythm.  Respiratory: Effort normal and breath sounds normal. No respiratory distress. She has no wheezes.  GI: Soft. Bowel sounds are normal. She exhibits no distension. There is no tenderness. There is no rebound.  Musculoskeletal: low back tender with truncal movement and palpation  Neurological: She is alert, impaired memory.   Left facial weakness. Limited insight and awareness. Skin: Skin is warm and dry.  Multiple scabbed areas bilateral feet improving   Motor 4/5 UE's. LE's 2-3/5 at hips/knees partly due to pain. 4/5 ankles      Sensation intact to light touch in bilateral upper and lower limbs Skin: indurated area right forearm with dry dressing  Assessment/Plan: 1. Weakness, balance and cognitive deficits secondary to traumatic brain injury which require 3  hours per day of interdisciplinary therapy in a comprehensive inpatient rehab setting. (reduced to allow for fatigue,poor activity tolerance thus far) Physiatrist is providing close team supervision and 24 hour management of active medical problems listed below. Physiatrist and rehab team continue to assess barriers to discharge/monitor patient progress toward functional and medical goals.  Function:  Bathing Bathing position   Position: Shower  Bathing parts Body parts bathed by patient: Right arm, Left arm, Chest, Abdomen, Right upper leg, Left upper leg, Front perineal area, Buttocks Body parts bathed by helper: Right lower leg, Left lower leg, Back  Bathing assist Assist Level: Touching or steadying assistance(Pt > 75%)      Upper Body Dressing/Undressing Upper body dressing   What is the patient wearing?: Pull over shirt/dress Bra - Perfomed by patient: Thread/unthread right bra strap, Thread/unthread left bra strap Bra - Perfomed by helper: Hook/unhook bra (pull down sports bra) Pull over shirt/dress - Perfomed by patient: Thread/unthread right sleeve, Thread/unthread left sleeve, Put head through opening, Pull shirt over trunk Pull over shirt/dress - Perfomed by helper: Pull shirt over trunk        Upper body assist Assist Level: Set up   Set up : To obtain clothing/put away  Lower Body  Dressing/Undressing Lower body dressing   What is the patient wearing?: Pants, Socks, Shoes   Underwear - Performed by helper: Thread/unthread right underwear leg, Thread/unthread left underwear leg, Pull underwear up/down Pants- Performed by patient: Thread/unthread right pants leg, Thread/unthread left pants leg Pants- Performed by helper: Pull pants  up/down   Non-skid slipper socks- Performed by helper: Don/doff right sock, Don/doff left sock   Socks - Performed by helper: Don/doff right sock, Don/doff left sock   Shoes - Performed by helper: Don/doff right shoe, Don/doff left shoe, Fasten right, Fasten left     TED Hose - Performed by patient: Don/doff right TED hose, Don/doff left TED hose TED Hose - Performed by helper: Don/doff right TED hose, Don/doff left TED hose  Lower body assist Assist for lower body dressing: Touching or steadying assistance (Pt > 75%)      Toileting Toileting Toileting activity did not occur: No continent bowel/bladder event Toileting steps completed by patient: Performs perineal hygiene, Adjust clothing prior to toileting Toileting steps completed by helper: Adjust clothing after toileting Toileting Assistive Devices: Grab bar or rail  Toileting assist Assist level: Supervision or verbal cues   Transfers Chair/bed transfer   Chair/bed transfer method: Stand pivot, Ambulatory Chair/bed transfer assist level: Touching or steadying assistance (Pt > 75%) Chair/bed transfer assistive device: Armrests     Locomotion Ambulation Ambulation activity did not occur: Refused   Max distance: 75 Assist level: Touching or steadying assistance (Pt > 75%)   Wheelchair   Type: Manual Max wheelchair distance: 130 Assist Level: Supervision or verbal cues  Cognition Comprehension Comprehension assist level: Understands basic 75 - 89% of the time/ requires cueing 10 - 24% of the time  Expression Expression assist level: Expresses basic 75 - 89% of the time/requires cueing 10 - 24% of the time. Needs helper to occlude trach/needs to repeat words.  Social Interaction Social Interaction assist level: Interacts appropriately 50 - 74% of the time - May be physically or verbally inappropriate.  Problem Solving Problem solving assist level: Solves basic 25 - 49% of the time - needs direction more than half the time to  initiate, plan or complete simple activities  Memory Memory assist level: Recognizes or recalls 25 - 49% of the time/requires cueing 50 - 75% of the time   Medical Problem List and Plan: 1. 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 DVT Prophylaxis/Anticoagulation: Right calf DVT without propagation---eliquis, asymptomatic    3. 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 Last [facility] 07/16/15.   -scheduled hydrocodone stopped due to cognition     4. Mood: LCSW to follow for evaluation and support.  5. Neuropsych: This patient is not fully capable of making decisions on her own behalf.  -safety plan to prevent fall.   -seroquel  qhs seems to be effective     6. Skin/Wound Care: Routine pressure relief measures. Maintain adequate nutrition and hyrdration status.  7. Fluids/Electrolytes/Nutrition: Continue Dysphagia 2 thins, fluid intake ok, solid intake poor.  -added megace for appetite  8. Acute on chronic renal failure: continue to push po fluids--- labs all reviewed. Holding fairly steady 9. HTN: Monitor tid for now.  10. Acute on chronic Iron deficiency anemia: continue iron supplement.  11. Aspiration PNA/H flu PNA: Has completed antibiotic course.  12. 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 Continue amiodarone daily and lopressor bid. To follow up with [facility] after discharge.  13. Rectal bleeding: Has resolved--without  recurrence. Will need follow up evaluation after discharge.  14. Depression: Used to take Celexa and Abilify--resumed abilify   15. Urinary retention?: foley out/ voiding trial  -100k E Coli-Sensitive to cefazolin , keflex completed---re-culture 100k yeast---diflucan--continue x 3  days total only given effect on QTC 16. Cellulitis right forearm:  -area improved   LOS (Days) 16 A FACE TO FACE EVALUATION WAS PERFORMED  Larayne Baxley T 09/07/2015 9:18 AM

## 2015-09-07 NOTE — Progress Notes (Addendum)
Social Work Patient ID: Lisa [name], female   DOB: 25-Feb-1927, 81 y.o.   MRN: [id number]   Have met with pt and son to review team conference and discuss d/c planning needs.  Pilar Plate discussion about pt's current care needs and about d/c options including SNF.  Pt has financial ability to hire private duty caregiver in the home, however, we discussed what could be of benefit to go to SNF where she can continue with qd tx.  Pt and son feel that d/c to SNF is preferred and are agreeable with this SW to begin bed search.  Will keep team posted on bed offers.  Jalene Demo, LCSW   FL2 out to facility as well as a phone contact with further information.

## 2015-09-07 NOTE — NC FL2 (Signed)
[place] MEDICAID FL2 LEVEL OF CARE SCREENING TOOL     IDENTIFICATION  Patient Name: Lisa [name] Birthdate: 02-Jul-1927 Sex: female Admission Date (Current Location): 08/22/2015  [facility] and [place] Number: [place]ropodist and Address:  [facility], [address] [place], [place] [address]      Provider Number: [id number]  Attending Physician Name and Address:  Ranelle Oyster, MD  Relative Name and Phone Number:       Current Level of Care: Other (Comment) (Acute Inpatient REhabilitation) Recommended Level of Care: Nursing Facility Prior Approval Number:    Date Approved/Denied:   PASRR Number: [id number]  Discharge Plan: SNF    Current Diagnoses: Patient Active Problem List   Diagnosis Date Noted  . Bacterial UTI 08/27/2015  . DVT, lower extremity (HCC) 08/27/2015  . TBI (traumatic brain injury) (HCC) 08/22/2015  . ICH (intracerebral hemorrhage) (HCC)   . Abscess   . Hypomagnesemia   . SDH (subdural hematoma) (HCC)   . Boil of upper extremity   . Pneumonia due to Haemophilus influenzae (HCC)   . Chronic systolic CHF (congestive heart failure) (HCC)   . Acute on chronic renal failure (HCC)   . Hypokalemia   . Bright red blood per rectum   . Dysphagia   . Pressure ulcer 08/16/2015  . Acute respiratory failure with hypoxemia (HCC)   . Hypernatremia   . Subdural hematoma (HCC) 08/03/2015  . Lumbar radiculopathy 06/06/2015  . Spinal stenosis, lumbar region, with neurogenic claudication 04/02/2015  . Degenerative lumbar disc 02/15/2015  . Lumbar post-laminectomy syndrome 02/15/2015  . Facet syndrome, lumbar 02/15/2015  . Sacroiliac joint dysfunction 02/15/2015    Orientation RESPIRATION BLADDER Height & Weight    Self, Time, Situation  Normal Continent  (160 cm) 147 lbs.  BEHAVIORAL SYMPTOMS/MOOD NEUROLOGICAL BOWEL NUTRITION STATUS      Continent Diet (D3, thin liquids)  AMBULATORY STATUS COMMUNICATION OF NEEDS Skin    Limited Assist Verbally Other (Comment) (Mild MASD to buttocks)                       Personal Care Assistance Level of Assistance  Bathing, Dressing Bathing Assistance: Limited assistance   Dressing Assistance: Limited assistance     Functional Limitations Info             SPECIAL CARE FACTORS FREQUENCY  PT (By licensed PT), OT (By licensed OT), Speech therapy     PT Frequency: 5x/wk OT Frequency: 5x/wk     Speech Therapy Frequency: 5x/wk      Contractures Contractures Info: Not present    Additional Factors Info  Code Status (Partial) Code Status Info: Partial             Current Medications (09/07/2015):  This is the current hospital active medication list Current Facility-Administered Medications  Medication Dose Route Frequency Provider Last Rate Last Dose  . acetaminophen (TYLENOL) tablet 325-650 mg  325-650 mg Oral Q4H PRN Jacquelynn Cree, PA-C   650 mg at 08/26/15 1025  . ALPRAZolam Prudy Feeler) tablet 0.25 mg  0.25 mg Oral Q8H PRN Gordy Savers, MD   0.25 mg at 09/04/15 2146  . alum & mag hydroxide-simeth (MAALOX/MYLANTA) 200-200-20 MG/5ML suspension 30 mL  30 mL Oral Q4H PRN Evlyn Kanner Love, PA-C   30 mL at 08/26/15 1025  . amiodarone (PACERONE) tablet 100 mg  100 mg Oral Daily Jacquelynn Cree, PA-C   100 mg at 09/07/15  1239  . apixaban (ELIQUIS) tablet 5 mg  5 mg Oral BID Evlyn KannerPamela S Love, PA-C   5 mg at 09/07/15 [id number]  . bisacodyl (DULCOLAX) suppository 10 mg  10 mg Rectal Daily PRN Jacquelynn CreePamela S Love, PA-C      . citalopram (CELEXA) tablet 20 mg  20 mg Oral QHS Ranelle OysterZachary T Swartz, MD   20 mg at 09/06/15 2130  . diphenhydrAMINE (BENADRYL) 12.5 MG/5ML elixir 12.5-25 mg  12.5-25 mg Oral Q6H PRN Evlyn KannerPamela S Love, PA-C      . feeding supplement (ENSURE ENLIVE) (ENSURE ENLIVE) liquid 237 mL  237 mL Oral TID BM Marinell BlightStephanie L Craig, RD   237 mL at 09/07/15 1240  . feeding supplement (PRO-STAT SUGAR FREE 64) liquid 30 mL  30 mL Oral Q1500 Marinell BlightStephanie L Craig, RD   30 mL at  09/06/15 1448  . fluconazole (DIFLUCAN) tablet 100 mg  100 mg Oral Daily Ranelle OysterZachary T Swartz, MD   100 mg at 09/07/15 [id number]  . guaiFENesin-dextromethorphan (ROBITUSSIN DM) 100-10 MG/5ML syrup 5-10 mL  5-10 mL Oral Q6H PRN Jacquelynn CreePamela S Love, PA-C      . HYDROcodone-acetaminophen (NORCO/VICODIN) 5-325 MG per tablet 1 tablet  1 tablet Oral Q6H PRN Jacquelynn CreePamela S Love, PA-C   1 tablet at 09/06/15 2130  . lidocaine (XYLOCAINE) 2 % jelly   Topical PRN Jacquelynn CreePamela S Love, PA-C      . megestrol (MEGACE) 400 MG/10ML suspension 400 mg  400 mg Oral Daily Ranelle OysterZachary T Swartz, MD   400 mg at 09/07/15 [id number]  . methocarbamol (ROBAXIN) tablet 500 mg  500 mg Oral Q6H PRN Jacquelynn Creeamela S Love, PA-C   500 mg at 09/04/15 2147  . metoprolol tartrate (LOPRESSOR) tablet 12.5 mg  12.5 mg Oral BID WC Evlyn KannerPamela S Love, PA-C   12.5 mg at 09/07/15 [id number]  . ondansetron (ZOFRAN) injection 4 mg  4 mg Intramuscular Q6H PRN Jacquelynn CreePamela S Love, PA-C       And  . ondansetron [facility](ZOFRAN) tablet 4 mg  4 mg Oral Q6H PRN Jacquelynn CreePamela S Love, PA-C   4 mg at 09/04/15 0947  . pantoprazole (PROTONIX) EC tablet 20 mg  20 mg Oral BID Jacquelynn Creeamela S Love, PA-C   20 mg at 09/07/15 [id number]  . prochlorperazine (COMPAZINE) injection 10 mg  10 mg Intravenous Q6H PRN Jacquelynn CreePamela S Love, PA-C       Or  . prochlorperazine (COMPAZINE) tablet 5 mg  5 mg Oral Q6H PRN Jacquelynn CreePamela S Love, PA-C   5 mg at 09/04/15 1949  . QUEtiapine (SEROQUEL) tablet 50 mg  50 mg Oral QHS Ranelle OysterZachary T Swartz, MD   50 mg at 09/06/15 2130  . RESOURCE THICKENUP CLEAR   Oral Once Jacquelynn CreePamela S Love, PA-C      . senna-docusate (Senokot-S) tablet 1 tablet  1 tablet Oral QHS Jacquelynn CreePamela S Love, PA-C   1 tablet at 09/06/15 2130  . sodium chloride 0.9 % injection 10-40 mL  10-40 mL Intracatheter PRN Ranelle OysterZachary T Swartz, MD   10 mL at 08/23/15 0453  . sodium phosphate (FLEET) 7-19 GM/118ML enema 1 enema  1 enema Rectal Once PRN Jacquelynn CreePamela S Love, PA-C         Discharge Medications: Please see discharge summary for a list of discharge medications.  Relevant  Imaging Results:  Relevant Lab Results:   Additional Information    Sabra Sessler, LCSW

## 2015-09-07 NOTE — Plan of Care (Signed)
Problem: RH BLADDER ELIMINATION Goal: RH STG MANAGE BLADDER WITH EQUIPMENT WITH ASSISTANCE STG Manage Bladder With Equipment With mod Assistance  Outcome: Progressing Patient getting up to toilet to void; use of PVR to ensure bladder empty and avoid use of I&O cath.

## 2015-09-07 NOTE — Progress Notes (Signed)
Occupational Therapy Session Note  Patient Details  Name: Lisa SnellenFaye K [name] MRN: [id number] Date of Birth: 10/12/1926  Today's Date: 09/07/2015 OT Individual Time: 1330-1400 OT Individual Time Calculation (min): 30 min   Short Term Goals: Week 3:  OT Short Term Goal 1 (Week 3): STGs = LTGs    Skilled Therapeutic Interventions/Progress Updates: Therapeutic activity with focus on improved activity tolerance, general strengthening, and dynamic standing balance during kitchen activity.   Pt received seated in recliner and requesting assist with removed laced shoe (due to double knot).   Pt accepted treatment recommendation of exercise in therapy gym and functional mobilit and requested setup to provide slip-on shoes stored in closet lower drawer.  Pt ambulated from recliner to sink, approx 6', and stopped d/t fatigue, requesting w/c escort to gym.   Pt was escorted to gym and completed 5 min of general strengthening using NuStep, level 1, for total of 225 steps.  During treatment pt and son, who arrived later in treatment, confirmed pt's interest in baking.   Pt agreed to inspect kitchen to assess for supplies needed to bake items and was escorted via w/c.   Pt stood from w/c to countertop and retrieved and replaced 1 bag of brown sugar and returned to w/c placed near sink, performing side steps using countertop for stability for 5 feet to access w/c.  Pt was escorted back to her room and assisted to bed with overall min assist for transfer, +2 assist from son [facility]   Pt left in bed with son attending to her needs.    Therapy Documentation Precautions:  Precautions Precautions: Fall Restrictions Weight Bearing Restrictions: No  Pain: Pain Assessment Pain Assessment: No/denies pain   See Function Navigator for Current Functional Status.   Therapy/Group: Individual Therapy  Daryan Cagley 09/07/2015, 2:44 PM

## 2015-09-08 ENCOUNTER — Inpatient Hospital Stay (HOSPITAL_COMMUNITY): Payer: Medicare Other | Admitting: Physical Therapy

## 2015-09-08 NOTE — Progress Notes (Addendum)
Physical Therapy Session Note  Patient Details  Name: Lisa [name] MRN: [id number] Date of Birth: September 06, 1927  Today's Date: 09/08/2015 PT Individual Time: 3358-2518 PT Individual Time Calculation (min): 45 min   Short Term Goals: Week 1:  PT Short Term Goal 1 (Week 1): Patient will perform bed mobility with min A. PT Short Term Goal 1 - Progress (Week 1): Met PT Short Term Goal 2 (Week 1): Patient will transfer wheelchair <> bed with mod A.  PT Short Term Goal 2 - Progress (Week 1): Not met PT Short Term Goal 3 (Week 1): Patient will initiate ambulation.  PT Short Term Goal 3 - Progress (Week 1): Met PT Short Term Goal 4 (Week 1): Patient will maintain standing balance x 2 min with assist of one person.  PT Short Term Goal 4 - Progress (Week 1): Not met  Skilled Therapeutic Interventions/Progress Updates:  Pt was seen bedside in the am. Pt reluctant to participate with therapy but agreed with encouragement. Pt transferred recliner to w/c with min guard and verbal cues. Pt propelled w/c about 150 feet with S and B UEs with several rest breaks. Pt performed multiple sit to stand and stand pivot transfers with rolling walker and min guard with verbal cues. Pt ambulated 25 feet x 4 with rolling walker and S with verbal cues. Pt also performed step taps 3 sets x 5 reps each for LE strengthening. Pt propelled w/c back towards room about 100 feet with B UEs and S. Pt transferred w/c to recliner with min guard and verbal cues. Pt left sitting up in recliner with quick release belt in place and call bell within reach.   Therapy Documentation Precautions:  Precautions Precautions: Fall Restrictions Weight Bearing Restrictions: No General:   Pain: Pt c/o 6/10 back pain.   See Function Navigator for Current Functional Status.   Therapy/Group: Individual Therapy  Dub Amis 09/08/2015, 12:41 PM

## 2015-09-08 NOTE — Progress Notes (Signed)
[place] PHYSICAL MEDICINE & REHABILITATION     PROGRESS NOTE    Subjective/Complaints: Had a good night sleep. Has had some pain in legs at times (radiating from back)  ROS still somewhat limited due to cognitive/communication deficits  Objective: Vital Signs: Blood pressure 124/47, pulse 80, temperature 98.8 F (37.1 C), temperature source Oral, resp. rate 18, height 5\' 3"  (1.6 m), weight 62.7 kg (138 lb 3.7 oz), SpO2 97 %. No results found. No results for input(s): WBC, HGB, HCT, PLT in the last 72 hours.  Recent Labs  09/06/15 0654  NA 134*  K 3.6  CL 96*  GLUCOSE 92  BUN 14  CREATININE 1.41*  CALCIUM 8.7*   CBG (last 3)  No results for input(s): GLUCAP in the last 72 hours.  Wt Readings from Last 3 Encounters:  09/08/15 62.7 kg (138 lb 3.7 oz)  08/22/15 67 kg (147 lb 11.3 oz)  08/03/15 65.772 kg (145 lb)    Physical Exam:  Constitutional: She is oriented to person, place, and time. She appears well-developed and well-nourished. Looks alert    HENT:  Head: Normocephalic and atraumatic.  Right Ear: No decreased hearing is noted.  Left Ear: No decreased hearing is noted.  Mouth/Throat: Mucous membranes still dry. Debris on teeth as well..    Eyes: Conjunctivae are normal. Pupils are equal, round, and reactive to light.   Neck: Normal range of motion. Neck supple.  Cardiovascular: Normal rate and regular rhythm.  Respiratory: Effort normal and breath sounds normal. No respiratory distress. She has no wheezes.  GI: Soft. Bowel sounds are normal. She exhibits no distension. There is no tenderness. There is no rebound.  Musculoskeletal: low back tender with truncal movement and palpation  Neurological: She is alert, impaired memory.   Left facial weakness. Limited insight and awareness. Skin: Skin is warm and dry.  Multiple scabbed areas bilateral feet improving   Motor 4/5 UE's. LE's 2-3/5 at hips/knees partly due to pain. 4/5 ankles     Sensation  intact to light touch in bilateral upper and lower limbs Skin: indurated area right forearm with dry dressing  Assessment/Plan: 1. Weakness, balance and cognitive deficits secondary to traumatic brain injury which require 3  hours per day of interdisciplinary therapy in a comprehensive inpatient rehab setting. (reduced to allow for fatigue,poor activity tolerance thus far) Physiatrist is providing close team supervision and 24 hour management of active medical problems listed below. Physiatrist and rehab team continue to assess barriers to discharge/monitor patient progress toward functional and medical goals.  Function:  Bathing Bathing position   Position: Shower  Bathing parts Body parts bathed by patient: Right arm, Left arm, Chest, Abdomen, Right upper leg, Left upper leg, Front perineal area, Buttocks Body parts bathed by helper: Right lower leg, Left lower leg, Back  Bathing assist Assist Level: Touching or steadying assistance(Pt > 75%)      Upper Body Dressing/Undressing Upper body dressing   What is the patient wearing?: Pull over shirt/dress Bra - Perfomed by patient: Thread/unthread right bra strap, Thread/unthread left bra strap Bra - Perfomed by helper: Hook/unhook bra (pull down sports bra) Pull over shirt/dress - Perfomed by patient: Thread/unthread right sleeve, Thread/unthread left sleeve, Put head through opening, Pull shirt over trunk Pull over shirt/dress - Perfomed by helper: Pull shirt over trunk        Upper body assist Assist Level: Set up   Set up : To obtain clothing/put away  Lower Body Dressing/Undressing Lower body dressing  What is the patient wearing?: Pants   Underwear - Performed by helper: Thread/unthread right underwear leg, Thread/unthread left underwear leg, Pull underwear up/down Pants- Performed by patient: Thread/unthread right pants leg, Thread/unthread left pants leg, Pull pants up/down Pants- Performed by helper: Pull pants up/down    Non-skid slipper socks- Performed by helper: Don/doff right sock, Don/doff left sock   Socks - Performed by helper: Don/doff right sock, Don/doff left sock Shoes - Performed by patient: Don/doff right shoe, Don/doff left shoe Shoes - Performed by helper: Don/doff right shoe, Don/doff left shoe, Fasten right, Fasten left     TED Hose - Performed by patient: Don/doff right TED hose, Don/doff left TED hose TED Hose - Performed by helper: Don/doff right TED hose, Don/doff left TED hose  Lower body assist Assist for lower body dressing: Touching or steadying assistance (Pt > 75%)      Toileting Toileting Toileting activity did not occur: No continent bowel/bladder event Toileting steps completed by patient: Adjust clothing prior to toileting, Performs perineal hygiene Toileting steps completed by helper: Adjust clothing after toileting Toileting Assistive Devices: Grab bar or rail  Toileting assist Assist level: Touching or steadying assistance (Pt.75%)   Transfers Chair/bed transfer   Chair/bed transfer method: Stand pivot, Ambulatory Chair/bed transfer assist level: Touching or steadying assistance (Pt > 75%) Chair/bed transfer assistive device: Armrests     Locomotion Ambulation Ambulation activity did not occur: Refused   Max distance: 85 ft Assist level: Supervision or verbal cues   Wheelchair   Type: Manual Max wheelchair distance: 130 Assist Level: Supervision or verbal cues  Cognition Comprehension Comprehension assist level: Understands basic 75 - 89% of the time/ requires cueing 10 - 24% of the time  Expression Expression assist level: Expresses basic 75 - 89% of the time/requires cueing 10 - 24% of the time. Needs helper to occlude trach/needs to repeat words.  Social Interaction Social Interaction assist level: Interacts appropriately 50 - 74% of the time - May be physically or verbally inappropriate.  Problem Solving Problem solving assist level: Solves basic 25 -  49% of the time - needs direction more than half the time to initiate, plan or complete simple activities  Memory Memory assist level: Recognizes or recalls 25 - 49% of the time/requires cueing 50 - 75% of the time   Medical Problem List and Plan: 1. 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 DVT Prophylaxis/Anticoagulation: Right calf DVT without propagation---eliquis, asymptomatic    3. 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 Last [facility] 07/16/15.   -scheduled hydrocodone stopped due to cognition     4. Mood: LCSW to follow for evaluation and support.  5. Neuropsych: This patient is not fully capable of making decisions on her own behalf.  -safety plan to prevent fall.   -seroquel  qhs has been effective     6. Skin/Wound Care: Routine pressure relief measures. Maintain adequate nutrition and hyrdration status.  7. Fluids/Electrolytes/Nutrition: Continue Dysphagia 2 thins, fluid intake ok, solid intake poor.  -added megace for appetite --intake generally better 8. Acute on chronic renal failure: continue to push po fluids--- labs all reviewed. Holding fairly steady 9. HTN: Monitor tid for now.  10. Acute on chronic Iron deficiency anemia: continue iron supplement.  11. Aspiration PNA/H flu PNA:  Has completed antibiotic course.  12. 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 Continue amiodarone daily and lopressor bid. To follow up with [facility] after discharge.  13. Rectal bleeding: Has resolved--without  recurrence. Will need follow up evaluation after discharge.  14. Depression: Used to take Celexa and Abilify--resumed abilify   15. Urinary retention?: foley out/ voiding trial  -100k E  Coli-Sensitive to cefazolin , keflex completed---re-culture 100k yeast---diflucan--continue x 3 days total only given effect on QTC 16. Cellulitis right forearm:  -area improved   LOS (Days) 17 A FACE TO FACE EVALUATION WAS PERFORMED  Janelli Welling T 09/08/2015 8:16 AM

## 2015-09-09 ENCOUNTER — Inpatient Hospital Stay (HOSPITAL_COMMUNITY): Payer: Medicare Other | Admitting: Physical Therapy

## 2015-09-09 LAB — CREATININE, SERUM
CREATININE: 1.43 mg/dL — AB (ref 0.44–1.00)
GFR calc Af Amer: 37 mL/min — ABNORMAL LOW (ref >60)
GFR, EST NON AFRICAN AMERICAN: 32 mL/min — AB (ref >60)

## 2015-09-09 NOTE — Progress Notes (Addendum)
Occupational Therapy Session Note  Patient Details  Name: Lisa [name] MRN: [id number] Date of Birth: September 02, 1927  Today's Date: 09/09/2015 OT Individual Time: 2800-3491 OT Individual Time Calculation (min): 30 min   Make up minutes   Short Term Goals: Week 1:  OT Short Term Goal 1 (Week 1): Pt will be engaged in 10 minutes of functional self care tasks with requiring less than 1 rest break . OT Short Term Goal 1 - Progress (Week 1): Progressing toward goal OT Short Term Goal 2 (Week 1): Pt will perform LB dressing with max A in order to decrease level of assist needed with self care. OT Short Term Goal 2 - Progress (Week 1): Progressing toward goal OT Short Term Goal 3 (Week 1): Pt will perform UB dressing with min A in order to decrease level of assist needed with self care. OT Short Term Goal 3 - Progress (Week 1): Met OT Short Term Goal 4 (Week 1): Pt will perform toilet transfer with mod A in order to increase I with functional transfers. OT Short Term Goal 4 - Progress (Week 1): Progressing toward goal Week 2:  OT Short Term Goal 1 (Week 2): Pt will engage in functional self care task for 10 mins or more with 1 rest break or less OT Short Term Goal 1 - Progress (Week 2): Met OT Short Term Goal 2 (Week 2): Pt will perform LB dressing tasks with max A sit<>stand OT Short Term Goal 2 - Progress (Week 2): Met OT Short Term Goal 3 (Week 2): Pt will perform toilet transfers with mod A stand pivot OT Short Term Goal 3 - Progress (Week 2): Met OT Short Term Goal 4 (Week 2): Pt will perform toileting tasks with mod A OT Short Term Goal 4 - Progress (Week 2): Met Week 3:  OT Short Term Goal 1 (Week 3): STGs = LTGs    Skilled Therapeutic Interventions/Progress Updates:    Pt. Lying in bed upon OT arrival.  Addressed functional mobility, transfers, endurance, standing balance.  Pt. Donned shoes EOB.  Ambulated with Rw to BR.  Incontinent of bowel, but was able to urinate.  Pt doffed pants and  OT assist with soiled briefs.  Pt donned pants with min assist.  Performed sit to stand with min steadying assist.  Ambulated to sink  To wash hands.  Pt. Complained of legs starting to hurt.  Ambulated to recliner and left with all needs in reach.   Therapy Documentation Precautions:  Precautions Precautions: Fall Restrictions Weight Bearing Restrictions: No      Pain:  4/10  Lower abdomnet     See Function Navigator for Current Functional Status.   Therapy/Group: Individual Therapy  Lisa Roca 09/09/2015, 5:22 PM

## 2015-09-09 NOTE — Progress Notes (Signed)
Physical Therapy Session Note  Patient Details  Name: Lisa [name] MRN: [id number] Date of Birth: 05/03/1927  Today's Date: 09/09/2015 PT Individual Time: 0800-0900 PT Individual Time Calculation (min): 60 min   Short Term Goals: Week 3:  PT Short Term Goal 1 (Week 3): = LTGs due to anticipated LOS  Skilled Therapeutic Interventions/Progress Updates:   Session focused on functional transfers, gait, strengthening, standing balance, and activity tolerance. Patient received in bed, handoff from NT reporting patient needing to toilet. Patient performed UB dressing sitting edge of bed and LB dressing with sit > stand from edge of bed using RW with steadying assist. Patient ambulated to and from bathroom using RW with S-min guard. Patient with small BM but unable to urinate, able to maintain standing balance using grab bar with supervision and total A for hygiene up to 2 min at a time. Patient managed clothing with steady assist. Patient instructed to stand at sink to wash hands but refused, stating, "I can't, my legs are giving out!" with no buckling noted. After seated rest break, patient stood to wash hands at sink and ambulated out to hallway using RW with supervision before requesting to sit again. After prolonged seated rest break, patient ambulated using RW x 30 ft with supervision. Patient propelled wheelchair using BUE x 100 ft with supervision to gym. BLE therex for strengthening and muscular endurance using RW for UE support in standing: seated LAQ x 10 each LE, standing marching x 12, standing heel raises x 15, standing knee flexion x 10 each LE. Patient performed multiple sit <> stand and stand pivot transfers using RW with supervision and cues for reaching back with one hand to control descent. Patient with increased SOB after exercises, instructed in pursed lip breathing technique. Patient ambulated about 10 feet back towards room with supervision before becoming SOB again and propelled  wheelchair back to room. Patient left sitting in recliner with all needs within reach.   Therapy Documentation Precautions:  Precautions Precautions: Fall Restrictions Weight Bearing Restrictions: No Pain: Pain Assessment Pain Assessment: No/denies pain   See Function Navigator for Current Functional Status.   Therapy/Group: Individual Therapy  Kerney ElbeVarner, Myrakle Wingler A 09/09/2015, 8:47 AM

## 2015-09-09 NOTE — Progress Notes (Signed)
[place] PHYSICAL MEDICINE & REHABILITATION     PROGRESS NOTE    Subjective/Complaints: Slept well. No new issues. Back pain still. Intake a little better  ROS still somewhat limited due to cognitive/communication deficits  Objective: Vital Signs: Blood pressure 134/55, pulse 74, temperature 98.6 F (37 C), temperature source Oral, resp. rate 17, height  (1.6 m), weight 61.9 kg (136 lb 7.4 oz), SpO2 97 %. No results found. No results for input(s): WBC, HGB, HCT, PLT in the last 72 hours.  Recent Labs  09/09/15 0615  CREATININE 1.43*   CBG (last 3)  No results for input(s): GLUCAP in the last 72 hours.  Wt Readings from Last 3 Encounters:  09/09/15 61.9 kg (136 lb 7.4 oz)  08/22/15 67 kg (147 lb 11.3 oz)  08/03/15 65.772 kg (145 lb)    Physical Exam:  Constitutional: She is oriented to person, place, and time. She appears well-developed and well-nourished. Looks alert    HENT:  Head: Normocephalic and atraumatic.  Right Ear: No decreased hearing is noted.  Left Ear: No decreased hearing is noted.  Mouth/Throat: Mucous membranes still dry. Debris on teeth as well..    Eyes: Conjunctivae are normal. Pupils are equal, round, and reactive to light.   Neck: Normal range of motion. Neck supple.  Cardiovascular: Normal rate and regular rhythm.  Respiratory: Effort normal and breath sounds normal. No respiratory distress. She has no wheezes.  GI: Soft. Bowel sounds are normal. She exhibits no distension. There is no tenderness. There is no rebound.  Musculoskeletal: low back tender with truncal movement and palpation  Neurological: She is alert, impaired memory.   Left facial weakness. Limited insight and awareness but some improvement. Skin: Skin is warm and dry.  Abrasions improving on legs   Motor 4/5 UE's. LE's 2-3/5 at hips/knees partly due to pain. 4/5 ankles     Sensation intact to light touch in bilateral upper and lower limbs Skin: indurated area  right forearm with dry dressing  Assessment/Plan: 1. Weakness, balance and cognitive deficits secondary to traumatic brain injury which require 3  hours per day of interdisciplinary therapy in a comprehensive inpatient rehab setting. (reduced to allow for fatigue,poor activity tolerance thus far) Physiatrist is providing close team supervision and 24 hour management of active medical problems listed below. Physiatrist and rehab team continue to assess barriers to discharge/monitor patient progress toward functional and medical goals.  Function:  Bathing Bathing position   Position: Shower  Bathing parts Body parts bathed by patient: Right arm, Left arm, Chest, Abdomen, Right upper leg, Left upper leg, Front perineal area, Buttocks Body parts bathed by helper: Right lower leg, Left lower leg, Back  Bathing assist Assist Level: Touching or steadying assistance(Pt > 75%)      Upper Body Dressing/Undressing Upper body dressing   What is the patient wearing?: Pull over shirt/dress Bra - Perfomed by patient: Thread/unthread right bra strap, Thread/unthread left bra strap Bra - Perfomed by helper: Hook/unhook bra (pull down sports bra) Pull over shirt/dress - Perfomed by patient: Thread/unthread right sleeve, Thread/unthread left sleeve, Put head through opening, Pull shirt over trunk Pull over shirt/dress - Perfomed by helper: Pull shirt over trunk        Upper body assist Assist Level: Set up   Set up : To obtain clothing/put away  Lower Body Dressing/Undressing Lower body dressing   What is the patient wearing?: Pants   Underwear - Performed by helper: Thread/unthread right underwear leg, Thread/unthread left  underwear leg, Pull underwear up/down Pants- Performed by patient: Thread/unthread right pants leg, Thread/unthread left pants leg, Pull pants up/down Pants- Performed by helper: Pull pants up/down   Non-skid slipper socks- Performed by helper: Don/doff right sock, Don/doff  left sock   Socks - Performed by helper: Don/doff right sock, Don/doff left sock Shoes - Performed by patient: Don/doff right shoe, Don/doff left shoe Shoes - Performed by helper: Don/doff right shoe, Don/doff left shoe, Fasten right, Fasten left     TED Hose - Performed by patient: Don/doff right TED hose, Don/doff left TED hose TED Hose - Performed by helper: Don/doff right TED hose, Don/doff left TED hose  Lower body assist Assist for lower body dressing: Touching or steadying assistance (Pt > 75%)      Toileting Toileting Toileting activity did not occur: No continent bowel/bladder event Toileting steps completed by patient: Adjust clothing prior to toileting, Adjust clothing after toileting Toileting steps completed by helper: Performs perineal hygiene Toileting Assistive Devices: Grab bar or rail  Toileting assist Assist level: Touching or steadying assistance (Pt.75%)   Transfers Chair/bed transfer   Chair/bed transfer method: Stand pivot, Ambulatory Chair/bed transfer assist level: Touching or steadying assistance (Pt > 75%) Chair/bed transfer assistive device: Armrests, Patent attorney Ambulation activity did not occur: Refused   Max distance: 25 Assist level: Supervision or verbal cues   Wheelchair   Type: Manual Max wheelchair distance: 150 Assist Level: Supervision or verbal cues  Cognition Comprehension Comprehension assist level: Understands basic 75 - 89% of the time/ requires cueing 10 - 24% of the time  Expression Expression assist level: Expresses basic 75 - 89% of the time/requires cueing 10 - 24% of the time. Needs helper to occlude trach/needs to repeat words.  Social Interaction Social Interaction assist level: Interacts appropriately 50 - 74% of the time - May be physically or verbally inappropriate.  Problem Solving Problem solving assist level: Solves basic 25 - 49% of the time - needs direction more than half the time to initiate,  plan or complete simple activities  Memory Memory assist level: Recognizes or recalls 25 - 49% of the time/requires cueing 50 - 75% of the time   Medical Problem List and Plan: 1. 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 DVT Prophylaxis/Anticoagulation: Right calf DVT without propagation---eliquis, asymptomatic    3. 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 Last [facility] 07/16/15.   -scheduled hydrocodone stopped due to cognition     4. Mood: LCSW to follow for evaluation and support.  5. Neuropsych: This patient is not fully capable of making decisions on her own behalf.  -safety plan to prevent fall.   -seroquel  qhs has been effective     6. Skin/Wound Care: Routine pressure relief measures. Maintain adequate nutrition and hyrdration status.  7. Fluids/Electrolytes/Nutrition: Continue Dysphagia 2 thins, fluid intake ok, solid intake poor.  -added megace for appetite --intake generally better 8. Acute on chronic renal failure: continue to push po fluids--- labs all reviewed. Holding fairly steady 9. HTN: Monitor tid for now.  10. Acute on chronic Iron deficiency anemia: continue iron supplement.  11. Aspiration PNA/H flu PNA: Has completed antibiotic course.  12. 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 Continue amiodarone daily  and lopressor bid. To follow up with [facility] after discharge.  13. Rectal bleeding: Has resolved--without  recurrence. Will need follow up evaluation after discharge.  14. Depression: Used to take Celexa and Abilify--resumed abilify   15. Urinary retention?: foley out/ voiding trial  -100k E Coli-Sensitive to cefazolin , keflex completed---re-culture 100k  yeast---diflucan--for 3 days then stop 16. Cellulitis right forearm:  -area improved   LOS (Days) 18 A FACE TO FACE EVALUATION WAS PERFORMED  Azzam Mehra T 09/09/2015 8:25 AM

## 2015-09-10 ENCOUNTER — Inpatient Hospital Stay (HOSPITAL_COMMUNITY): Payer: Medicare Other | Admitting: Occupational Therapy

## 2015-09-10 ENCOUNTER — Inpatient Hospital Stay (HOSPITAL_COMMUNITY): Payer: Medicare Other | Admitting: Speech Pathology

## 2015-09-10 ENCOUNTER — Inpatient Hospital Stay (HOSPITAL_COMMUNITY): Payer: Medicare Other | Admitting: Physical Therapy

## 2015-09-10 ENCOUNTER — Inpatient Hospital Stay (HOSPITAL_COMMUNITY): Payer: Medicare Other

## 2015-09-10 NOTE — Progress Notes (Signed)
Physical Therapy Session Note  Patient Details  Name: Lisa [name] MRN: [id number] Date of Birth: 1927/06/30  Today's Date: 09/10/2015 PT Individual Time: 1505-1530 PT Individual Time Calculation (min): 25 min   Short Term Goals: Week 3:  PT Short Term Goal 1 (Week 3): = LTGs due to anticipated LOS  Skilled Therapeutic Interventions/Progress Updates:    pt received resting in w/c and agreeable to therapy session.  PT propelled pt to therapy gym for time management.  PT instructed pt in BLE therex x15 reps: heel/toe raises, LAQ, hip flexion, ball squeezes, and hip abd against level 2 theraband.  Pt declined standing exercise, stating she was too fatigued.  Pt propelled w/c x100' with BUEs for overall endurance.  Stand/pivot from w/c>bed with min assist and pt positioned self in supine with supervision.  Call bell in reach and needs met.   Therapy Documentation Precautions:  Precautions Precautions: Fall Restrictions Weight Bearing Restrictions: No General:   Vital Signs: Therapy Vitals Temp: 99.9 F (37.7 C) Temp Source: Oral Pulse Rate: 84 Resp: 18 BP: (!) 131/50 mmHg Patient Position (if appropriate): Lying Oxygen Therapy SpO2: 100 % O2 Device: Not Delivered Pain: Pain Assessment Pain Assessment: No/denies pain Pain Score: 0-No pain Mobility:   Locomotion :    Trunk/Postural Assessment :    Balance:   Exercises:   Other Treatments:     See Function Navigator for Current Functional Status.   Therapy/Group: Individual Therapy  Earnest Conroy Penven-Crew 09/10/2015, 6:06 PM

## 2015-09-10 NOTE — Progress Notes (Signed)
Occupational Therapy Session Note  Patient Details  Name: Lisa [name] MRN: [id number] Date of Birth: 03-05-1927  Today's Date: 09/10/2015 OT Individual Time: 0950-1100 OT Individual Time Calculation (min): 70 min    Short Term Goals: Week 3:  OT Short Term Goal 1 (Week 3): STGs = LTGs    Skilled Therapeutic Interventions/Progress Updates:    Pt seen for skilled OT to facilitate functional mobility, problem solving, and activity tolerance. Pt stated she had already dressed this am and would prefer to wait until tomorrow to shower.  Pt asked if she would be willing to assist with meal prep for the [facility] tomorrow afternoon. Pt was excited and eager "as long as I can do it sitting down". Pt was able to get out of bed, walk with RW to w/c all with S. Washed hands and pt taken to kitchen via w/c. Pt worked on cutting large blocks of cheese into cubes and discussed what type of knife she would need. Pt decided between 3 knives which on she would use. Pt worked on cutting cubes with excellent attention while engaging in social conversation the entire time. Good activity tolerance with activity.  Pt taken back to room and requested to get back in bed. Only S needed with A to lock breaks on w/c.  Pt in bed with son in the room with all needs met.   Therapy Documentation Precautions:  Precautions Precautions: Fall Restrictions Weight Bearing Restrictions: No    Vital Signs: Therapy Vitals Pulse Rate: 75 BP: (!) 121/51 mmHg Pain: Pain Assessment Pain Assessment: No/denies pain ADL:  See Function Navigator for Current Functional Status.   Therapy/Group: Individual Therapy  SAGUIER,JULIA 09/10/2015, 12:48 PM

## 2015-09-10 NOTE — Progress Notes (Signed)
Physical Therapy Session Note  Patient Details  Name: Lisa SnellenFaye K [name] MRN: [id number] Date of Birth: 10/18/1926  Today's Date: 09/10/2015 PT Individual Time: 0803-0900 PT Individual Time Calculation (min): 57 min    Skilled Therapeutic Interventions/Progress Updates:    Session focused on functional mobility training including transfers, gait with RW (in home and controlled environment up to about 50' on each trial), dynamic standing balance during functional activities and w/c mobility. Pt performed basic transfers OOB and on/off toilet at supervision to steady assist level throughout session with verbal cues for reaching back before sitting for eccentric control. Performed dressing EOB with assist for pants and shoes. W/c mobility training for strengthening and endurance of BUE with cues for efficient propulsion technique and encouragement. Seated LE strengthening on Kinetron x 20 reps each side. Normal TUG with RW = 1 min 20 seconds administered with results indicating high fall risk . End of session request to return back to bed and performed with overall supervision. Left with RN in the room and bed alarm on.   Therapy Documentation Precautions:  Precautions Precautions: Fall Restrictions Weight Bearing Restrictions: No   Pain:  Denies pain   See Function Navigator for Current Functional Status.   Therapy/Group: Individual Therapy  Karolee StampsGray, Evanthia Maund Darrol PokeBrescia  Sirena Riddle B. Tamarcus Condie, PT, DPT  09/10/2015, 9:07 AM

## 2015-09-10 NOTE — Progress Notes (Signed)
Speech Language Pathology Daily Session Note  Patient Details  Name: Lisa SnellenFaye K [name] MRN: [id number] Date of Birth: 10/02/1926  Today's Date: 09/10/2015 SLP Individual Time: 1400-1500 SLP Individual Time Calculation (min): 60 min  Short Term Goals: Week 3: SLP Short Term Goal 1 (Week 3): Patient will demonstrate efficient mastication with minimal oral residue with trials of Dys. 3 textures over 2 consecutive sessions prior to advancement with minimal overt s/s of aspiration and supervision verbal cues.   SLP Short Term Goal 2 (Week 3): Patient will selective attention to a functional task in a minimally distracting enviornment for 10 minutes with Mod A verbal cues for redirection.  SLP Short Term Goal 3 (Week 3): Patient will demonstrate functional problem solving for basic and familiar tasks with Min A verbal cues.  SLP Short Term Goal 4 (Week 3): Patient will identify 2 physical and 2 cognitive deficits with Mod A question and semantic cues.  SLP Short Term Goal 5 (Week 3): Patient will utilize external memory aids to recall new, daily information with Mod A multimodal cues.   Skilled Therapeutic Interventions: Skilled treatment session focused on cognitive goals. Upon arrival, patient was awake while supine in bed and agreeable to participate in treatment session. SLP facilitated session by providing extra time and supervision verbal cues for safety with the transfer from the bed to the wheelchair. SLP also facilitated session by providing Min A question cues for functional problem solving during a basic kitchen task of baking cookies. Patient attended to the task in a moderately distracting environment for ~30 minutes with supervision verbal cues for redirection. Patient left upright in wheelchair with all needs within reach and son present. Continue with current plan of care.    Function:   Cognition Comprehension Comprehension assist level: Understands basic 90% of the time/cues < 10% of the  time  Expression   Expression assist level: Expresses basic 90% of the time/requires cueing < 10% of the time.  Social Interaction Social Interaction assist level: Interacts appropriately 90% of the time - Needs monitoring or encouragement for participation or interaction.  Problem Solving Problem solving assist level: Solves basic 75 - 89% of the time/requires cueing 10 - 24% of the time  Memory Memory assist level: Recognizes or recalls 50 - 74% of the time/requires cueing 25 - 49% of the time    Pain Unable to rate, pain in lower stomach/bladder. RN made aware and administered medications.   Therapy/Group: Individual Therapy  Reeta Kuk 09/10/2015, 4:03 PM

## 2015-09-10 NOTE — NC FL2 (Deleted)
MEDICAID FL2 LEVEL OF CARE SCREENING TOOL     IDENTIFICATION  Patient Name: Lisa [name] Birthdate: 07/25/1927 Sex: female Admission Date (Current Location): 08/22/2015  [facility] [place]   Facility and Address:  [facility] [place], [address], [place], [place] [address]      Provider Number: [id number]  Attending Physician Name and Address:  Ranelle OysterZachary T Swartz, MD  Relative Name and Phone Number:       Current Level of Care: Other (Comment) (Acute Inpatient REhabilitation) Recommended Level of Care: Nursing Facility Prior Approval Number:    Date Approved/Denied:   PASRR Number: [id number]  Discharge Plan: SNF    Current Diagnoses: Patient Active Problem List   Diagnosis Date Noted  . Bacterial UTI 08/27/2015  . DVT, lower extremity (HCC) 08/27/2015  . TBI (traumatic brain injury) (HCC) 08/22/2015  . ICH (intracerebral hemorrhage) (HCC)   . Abscess   . Hypomagnesemia   . SDH (subdural hematoma) (HCC)   . Boil of upper extremity   . Pneumonia due to Haemophilus influenzae (HCC)   . Chronic systolic CHF (congestive heart failure) (HCC)   . Acute on chronic renal failure (HCC)   . Hypokalemia   . Bright red blood per rectum   . Dysphagia   . Pressure ulcer 08/16/2015  . Acute respiratory failure with hypoxemia (HCC)   . Hypernatremia   . Subdural hematoma (HCC) 08/03/2015  . Lumbar radiculopathy 06/06/2015  . Spinal stenosis, lumbar region, with neurogenic claudication 04/02/2015  . Degenerative lumbar disc 02/15/2015  . Lumbar post-laminectomy syndrome 02/15/2015  . Facet syndrome, lumbar 02/15/2015  . Sacroiliac joint dysfunction 02/15/2015    Orientation RESPIRATION BLADDER Height & Weight    Self, Time, Situation  Normal Continent 5\' 3"  (160 cm) 147 lbs.  BEHAVIORAL SYMPTOMS/MOOD NEUROLOGICAL BOWEL NUTRITION STATUS      Continent Diet (D3, thin liquids)  AMBULATORY STATUS COMMUNICATION OF NEEDS Skin    Limited Assist Verbally Other (Comment) (Mild MASD to buttocks)                       Personal Care Assistance Level of Assistance  Bathing, Dressing Bathing Assistance: Limited assistance   Dressing Assistance: Limited assistance     Functional Limitations Info             SPECIAL CARE FACTORS FREQUENCY  PT (By licensed PT), OT (By licensed OT), Speech therapy     PT Frequency: 5x/wk OT Frequency: 5x/wk     Speech Therapy Frequency: 5x/wk      Contractures Contractures Info: Not present    Additional Factors Info  Code Status (Partial) Code Status Info: Partial             Current Medications (09/10/2015):  This is the current hospital active medication list Current Facility-Administered Medications  Medication Dose Route Frequency Provider Last Rate Last Dose  . acetaminophen (TYLENOL) tablet 325-650 mg  325-650 mg Oral Q4H PRN Jacquelynn Creeamela S Love, PA-C   650 mg at 08/26/15 1025  . ALPRAZolam Prudy Feeler(XANAX) tablet 0.25 mg  0.25 mg Oral Q8H PRN Gordy SaversPeter F Kwiatkowski, MD   0.25 mg at 09/09/15 2118  . alum & mag hydroxide-simeth (MAALOX/MYLANTA) 200-200-20 MG/5ML suspension 30 mL  30 mL Oral Q4H PRN Evlyn KannerPamela S Love, PA-C   30 mL at 09/10/15 1318  . amiodarone (PACERONE) tablet 100 mg  100 mg Oral Daily Jacquelynn Creeamela S Love, PA-C   100 mg at 09/10/15  1228  . apixaban (ELIQUIS) tablet 5 mg  5 mg Oral BID Evlyn Kanner Love, PA-C   5 mg at 09/10/15 1610  . bisacodyl (DULCOLAX) suppository 10 mg  10 mg Rectal Daily PRN Jacquelynn Cree, PA-C      . citalopram (CELEXA) tablet 20 mg  20 mg Oral QHS Ranelle Oyster, MD   20 mg at 09/09/15 2115  . diphenhydrAMINE (BENADRYL) 12.5 MG/5ML elixir 12.5-25 mg  12.5-25 mg Oral Q6H PRN Evlyn Kanner Love, PA-C      . feeding supplement (ENSURE ENLIVE) (ENSURE ENLIVE) liquid 237 mL  237 mL Oral TID BM Marinell Blight, RD   237 mL at 09/09/15 2115  . feeding supplement (PRO-STAT SUGAR FREE 64) liquid 30 mL  30 mL Oral Q1500 Marinell Blight, RD   30 mL at  09/09/15 1410  . guaiFENesin-dextromethorphan (ROBITUSSIN DM) 100-10 MG/5ML syrup 5-10 mL  5-10 mL Oral Q6H PRN Jacquelynn Cree, PA-C      . HYDROcodone-acetaminophen (NORCO/VICODIN) 5-325 MG per tablet 1 tablet  1 tablet Oral Q6H PRN Jacquelynn Cree, PA-C   1 tablet at 09/09/15 1735  . lidocaine (XYLOCAINE) 2 % jelly   Topical PRN Jacquelynn Cree, PA-C      . megestrol (MEGACE) 400 MG/10ML suspension 400 mg  400 mg Oral Daily Ranelle Oyster, MD   400 mg at 09/10/15 0858  . methocarbamol (ROBAXIN) tablet 500 mg  500 mg Oral Q6H PRN Jacquelynn Cree, PA-C   500 mg at 09/04/15 2147  . metoprolol tartrate (LOPRESSOR) tablet 12.5 mg  12.5 mg Oral BID WC Evlyn Kanner Love, PA-C   12.5 mg at 09/10/15 0858  . ondansetron (ZOFRAN) injection 4 mg  4 mg Intramuscular Q6H PRN Jacquelynn Cree, PA-C       And  . ondansetron [facility]) tablet 4 mg  4 mg Oral Q6H PRN Jacquelynn Cree, PA-C   4 mg at 09/04/15 0947  . pantoprazole (PROTONIX) EC tablet 20 mg  20 mg Oral BID Jacquelynn Cree, PA-C   20 mg at 09/10/15 9604  . prochlorperazine (COMPAZINE) injection 10 mg  10 mg Intravenous Q6H PRN Jacquelynn Cree, PA-C       Or  . prochlorperazine (COMPAZINE) tablet 5 mg  5 mg Oral Q6H PRN Jacquelynn Cree, PA-C   5 mg at 09/04/15 1949  . QUEtiapine (SEROQUEL) tablet 50 mg  50 mg Oral QHS Ranelle Oyster, MD   50 mg at 09/09/15 2115  . RESOURCE THICKENUP CLEAR   Oral Once Jacquelynn Cree, PA-C      . senna-docusate (Senokot-S) tablet 1 tablet  1 tablet Oral QHS Jacquelynn Cree, PA-C   1 tablet at 09/09/15 2115  . sodium chloride 0.9 % injection 10-40 mL  10-40 mL Intracatheter PRN Ranelle Oyster, MD   10 mL at 08/23/15 0453  . sodium phosphate (FLEET) 7-19 GM/118ML enema 1 enema  1 enema Rectal Once PRN Jacquelynn Cree, PA-C         Discharge Medications: Please see discharge summary for a list of discharge medications.  Relevant Imaging Results:  Relevant Lab Results:   Additional Information    Alainah Phang, LCSW

## 2015-09-10 NOTE — Progress Notes (Signed)
[facility] PHYSICAL MEDICINE & REHABILITATION     PROGRESS NOTE    Subjective/Complaints: Had a good night. Up with tech going to bathroom when I arrived.   ROS still somewhat limited due to cognitive/communication deficits  Objective: Vital Signs: Blood pressure 118/48, pulse 70, temperature 98.3 F (36.8 C), temperature source Oral, resp. rate 17, height  (1.6 m), weight 63.8 kg (140 lb 10.5 oz), SpO2 97 %. No results found. No results for input(s): WBC, HGB, HCT, PLT in the last 72 hours.  Recent Labs  09/09/15 0615  CREATININE 1.43*   CBG (last 3)  No results for input(s): GLUCAP in the last 72 hours.  Wt Readings from Last 3 Encounters:  09/10/15 63.8 kg (140 lb 10.5 oz)  08/22/15 67 kg (147 lb 11.3 oz)  08/03/15 65.772 kg (145 lb)    Physical Exam:  Constitutional: She is oriented to person, place, and time. She appears well-developed and well-nourished. Looks alert    HENT:  Head: Normocephalic and atraumatic.  Right Ear: No decreased hearing is noted.  Left Ear: No decreased hearing is noted.  Mouth/Throat: Mucous membranes still dry. Debris on teeth as well..    Eyes: Conjunctivae are normal. Pupils are equal, round, and reactive to light.   Neck: Normal range of motion. Neck supple.  Cardiovascular: Normal rate and regular rhythm.  Respiratory: Effort normal and breath sounds normal. No respiratory distress. She has no wheezes.  GI: Soft. Bowel sounds are normal. She exhibits no distension. There is no tenderness. There is no rebound.  Musculoskeletal: low back tender with truncal movement and palpation  Neurological: She is alert, impaired memory.   Left facial weakness. Limited insight and awareness but some improvement. Skin: Skin is warm and dry.  Abrasions improving on legs   Motor 4/5 UE's. LE's 2-3/5 at hips/knees partly due to pain. 4/5 ankles     Sensation intact to light touch in bilateral upper and lower limbs Skin: indurated  area right forearm with dry dressing  Assessment/Plan: 1. Weakness, balance and cognitive deficits secondary to traumatic brain injury which require 3  hours per day of interdisciplinary therapy in a comprehensive inpatient rehab setting. (reduced to allow for fatigue,poor activity tolerance thus far) Physiatrist is providing close team supervision and 24 hour management of active medical problems listed below. Physiatrist and rehab team continue to assess barriers to discharge/monitor patient progress toward functional and medical goals.  Function:  Bathing Bathing position   Position: Shower  Bathing parts Body parts bathed by patient: Right arm, Left arm, Chest, Abdomen, Right upper leg, Left upper leg, Front perineal area, Buttocks Body parts bathed by helper: Right lower leg, Left lower leg, Back  Bathing assist Assist Level: Touching or steadying assistance(Pt > 75%)      Upper Body Dressing/Undressing Upper body dressing   What is the patient wearing?: Pull over shirt/dress Bra - Perfomed by patient: Thread/unthread right bra strap, Thread/unthread left bra strap Bra - Perfomed by helper: Hook/unhook bra (pull down sports bra) Pull over shirt/dress - Perfomed by patient: Thread/unthread right sleeve, Thread/unthread left sleeve, Put head through opening, Pull shirt over trunk Pull over shirt/dress - Perfomed by helper: Pull shirt over trunk        Upper body assist Assist Level: Set up   Set up : To obtain clothing/put away  Lower Body Dressing/Undressing Lower body dressing   What is the patient wearing?: Pants   Underwear - Performed by helper: Thread/unthread right underwear leg,  Thread/unthread left underwear leg, Pull underwear up/down Pants- Performed by patient: Thread/unthread right pants leg, Thread/unthread left pants leg, Pull pants up/down Pants- Performed by helper: Pull pants up/down   Non-skid slipper socks- Performed by helper: Don/doff right sock,  Don/doff left sock   Socks - Performed by helper: Don/doff right sock, Don/doff left sock Shoes - Performed by patient: Don/doff right shoe, Don/doff left shoe Shoes - Performed by helper: Don/doff right shoe, Don/doff left shoe, Fasten right, Fasten left     TED Hose - Performed by patient: Don/doff right TED hose, Don/doff left TED hose TED Hose - Performed by helper: Don/doff right TED hose, Don/doff left TED hose  Lower body assist Assist for lower body dressing: Touching or steadying assistance (Pt > 75%)      Toileting Toileting Toileting activity did not occur: No continent bowel/bladder event Toileting steps completed by patient: Adjust clothing prior to toileting, Performs perineal hygiene Toileting steps completed by helper: Adjust clothing after toileting Toileting Assistive Devices: Grab bar or rail  Toileting assist Assist level: Touching or steadying assistance (Pt.75%)   Transfers Chair/bed transfer   Chair/bed transfer method: Ambulatory, Stand pivot Chair/bed transfer assist level: Supervision or verbal cues Chair/bed transfer assistive device: Armrests, Patent attorney Ambulation activity did not occur: Refused   Max distance: 30 Assist level: Touching or steadying assistance (Pt > 75%)   Wheelchair   Type: Manual Max wheelchair distance: 100 Assist Level: Supervision or verbal cues  Cognition Comprehension Comprehension assist level: Understands basic 75 - 89% of the time/ requires cueing 10 - 24% of the time  Expression Expression assist level: Expresses basic 75 - 89% of the time/requires cueing 10 - 24% of the time. Needs helper to occlude trach/needs to repeat words.  Social Interaction Social Interaction assist level: Interacts appropriately 50 - 74% of the time - May be physically or verbally inappropriate.  Problem Solving Problem solving assist level: Solves basic 25 - 49% of the time - needs direction more than half the time to  initiate, plan or complete simple activities  Memory Memory assist level: Recognizes or recalls 25 - 49% of the time/requires cueing 50 - 75% of the time   Medical Problem List and Plan: 1. Cognitive deficits, gait disorder secondary to Traumatic frontal intracranial hemorrhage, subarachnoid hemorrhage and subdural hemorrhage  -therapy at 3hr per day due to poor activity tolerance  -dc plan now SNF 2. DVT Prophylaxis/Anticoagulation: Right calf DVT without propagation---eliquis, asymptomatic    3. 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 Last [facility] 07/16/15.   -scheduled hydrocodone stopped due to cognition     4. Mood: LCSW to follow for evaluation and support.  5. Neuropsych: This patient is not fully capable of making decisions on her own behalf.  -safety plan to prevent fall.   -seroquel  qhs has been effective     6. Skin/Wound Care: Routine pressure relief measures. Maintain adequate nutrition and hyrdration status.  7. Fluids/Electrolytes/Nutrition: Continue Dysphagia 2 thins, fluid intake ok, solid intake poor.  -added megace for appetite --intake has been generally better 8. Acute on chronic renal failure: continue to push po fluids-  9. HTN: Monitor tid for now.  10. Acute on chronic Iron deficiency anemia: continue iron supplement.  11. Aspiration PNA/H flu PNA: Has completed antibiotic course.  12. 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 Continue amiodarone daily and lopressor bid.  To follow up with [facility] after discharge.  13. Rectal bleeding: Has resolved--without  recurrence. Will need follow up evaluation after discharge.  14. Depression: Used to take Celexa and Abilify--stopped abilify (cost issue) now on seroquel  15. Urinary retention?: foley out/ voiding trial  -100k E Coli, fungal UTI---abx completed 16. Cellulitis right forearm:  -area improved   LOS (Days)  19 A FACE TO FACE EVALUATION WAS PERFORMED  SWARTZ,ZACHARY T 09/10/2015 8:55 AM

## 2015-09-11 ENCOUNTER — Inpatient Hospital Stay (HOSPITAL_COMMUNITY): Payer: Medicare Other | Admitting: Speech Pathology

## 2015-09-11 ENCOUNTER — Inpatient Hospital Stay (HOSPITAL_COMMUNITY): Payer: Medicare Other | Admitting: Physical Therapy

## 2015-09-11 ENCOUNTER — Inpatient Hospital Stay (HOSPITAL_COMMUNITY): Payer: Medicare Other

## 2015-09-11 NOTE — Progress Notes (Signed)
Social Work Patient ID: Lisa SnellenFaye K [name], female   DOB: 02/23/1927, 81 y.o.   MRN: [id number]   [facility] [place]  Pt and son have accepted bed and planning for transfer tomorrow via son's car.  Pt and son pleased with progress here and son extremely complimentary of care his mother has received in our program.    [place], Vanette Noguchi, LCSW

## 2015-09-11 NOTE — Progress Notes (Signed)
Occupational Therapy Session Note  Patient Details  Name: Lisa [name] MRN: [id number] Date of Birth: 09/04/1927  Today's Date: 09/11/2015 OT Individual Time: 4098-11910700-0812 OT Individual Time Calculation (min): 72 min    Short Term Goals: Week 3:  OT Short Term Goal 1 (Week 3): STGs = LTGs    Skilled Therapeutic Interventions/Progress Updates:    Pt resting in bed upon arrival and agreeable to BADL retraining.  Pt engaged in BADL retraining including bathing at shower level and dressing with sit<>stand from w/c at sink.  Pt amb with RW in room to select clothing prior to amb to bathroom to use toilet and transfer to shower.  Pt completed bathing with sit<>stand from tub bench.  Pt donned robe and amb with RW to room to complete grooming tasks and dressing.  Pt continues to require mod A for LB dressing tasks.  Pt requires more than a reasonable amount of time to complete tasks with multiple rest breaks after standing for >30 seconds. Pt was able to recall therapist's name from previous day and that the therapist had talked about the patient holiday party today.  Pt exhibited LOB X 2 while standing and required steady A for correction.  Pt erquires steady A with standing tasks and functional amb with RW.  Focus on activity tolerance, sit<>stand, standing balance, functional amb with RW, and safety awareness.  Therapy Documentation Precautions:  Precautions Precautions: Fall Restrictions Weight Bearing Restrictions: No   Pain:  3/10 lower back; repositioned, RN aware  See Function Navigator for Current Functional Status.   Therapy/Group: Individual Therapy  Rich BraveLanier, Cordelro Gautreau Chappell 09/11/2015, 8:14 AM

## 2015-09-11 NOTE — Patient Care Conference (Signed)
Inpatient RehabilitationTeam Conference and Plan of Care Update Date: 09/11/2015   Time: 2:50 PM    Patient Name: Lisa [name]      Medical Record Number: [id number]  Date of Birth: 08/03/1927 Sex: Female         Room/Bed: 4W18C/4W18C-01 Payor Info: Payor: MEDICARE / Plan: MEDICARE PART A AND B / Product Type: *No Product type* /    Admitting Diagnosis: TBI after a fall  Admit Date/Time:  08/22/2015  2:45 PM Admission Comments: No comment available   Primary Diagnosis:  TBI (traumatic brain injury) (HCC) Principal Problem: TBI (traumatic brain injury) [facility])  Patient Active Problem List   Diagnosis Date Noted  . Bacterial UTI 08/27/2015  . DVT, lower extremity (HCC) 08/27/2015  . TBI (traumatic brain injury) (HCC) 08/22/2015  . ICH (intracerebral hemorrhage) (HCC)   . Abscess   . Hypomagnesemia   . SDH (subdural hematoma) (HCC)   . Boil of upper extremity   . Pneumonia due to Haemophilus influenzae (HCC)   . Chronic systolic CHF (congestive heart failure) (HCC)   . Acute on chronic renal failure (HCC)   . Hypokalemia   . Bright red blood per rectum   . Dysphagia   . Pressure ulcer 08/16/2015  . Acute respiratory failure with hypoxemia (HCC)   . Hypernatremia   . Subdural hematoma (HCC) 08/03/2015  . Lumbar radiculopathy 06/06/2015  . Spinal stenosis, lumbar region, with neurogenic claudication 04/02/2015  . Degenerative lumbar disc 02/15/2015  . Lumbar post-laminectomy syndrome 02/15/2015  . Facet syndrome, lumbar 02/15/2015  . Sacroiliac joint dysfunction 02/15/2015    Expected Discharge Date: Expected Discharge Date: 09/12/15 (likely to change to SNF)  Team Members Present:       Current Status/Progress Goal Weekly Team Focus  Medical   improved cognition, sleep. moving better as a whole, maintaining hydration on own  stabilize medically for transfer  nutrition   Bowel/Bladder   continent of bladder, incontinent of bowel   continent of bowela nd bladder with mod  assist  monitor PVR's, OOB to BR for all toileting   Swallow/Nutrition/ Hydration   Dys. 3 textures with thin liquids, Min A for use of swallowing strategies   Min A   tolerance of current diet, trials of upgraded textures    ADL's   BADLs-supervision UB, mod A LB ; functional transfers-steady A; functional amb with RW; steady A; toileting-mod A  min A overall  activity tolerance, safety awareness, funcitonal mobility, BADL retraining   Mobility   stand pivot transfers with min A, sit <> stand, standing balance, and gait up to 85 ft with S-min A, supervision bed mobility and wheelchair propulsion  min A overall short distance household gait  functional transfers, standing balance, standing tolerance, gait, strengthening, activity tolerance, participation, pt/family education   Communication             Safety/Cognition/ Behavioral Observations  Min-Mod A   min assist  attention, problem solving, recall and awareness    Pain   chronic back pain, recent complaint of abdominal pain (patient thinks it is gas)  pain less than or equalt o 4 on a scale of 0-10  assess pain q4h and prn, medicate as indicated   Skin   MASD to bilateral buttocks  no new skin breakdown/injury  keep buttocks clean and dry, apply barrier cream , assess skin q shift    Rehab Goals Patient on target to meet rehab goals: Yes Rehab Goals Revised: progress and participation is  much improved. *See Care Plan and progress notes for long and short-term goals.  Barriers to Discharge: see prior    Possible Resolutions to Barriers:  see prior, SNF placement    Discharge Planning/Teaching Needs:  Plan has changed to SNF and hope to have bed secured this week for transfer.      Team Discussion:  SW reports SNF bed available tomorrow.  MD clearance given.  Revisions to Treatment Plan:  DC plan had changed to SNF.  Bed available tomorrow.   Continued Need for Acute Rehabilitation Level of Care: The patient requires daily  medical management by a physician with specialized training in physical medicine and rehabilitation for the following conditions: Daily direction of a multidisciplinary physical rehabilitation program to ensure safe treatment while eliciting the highest outcome that is of practical value to the patient.: Yes Daily medical management of patient stability for increased activity during participation in an intensive rehabilitation regime.: Yes Daily analysis of laboratory values and/or radiology reports with any subsequent need for medication adjustment of medical intervention for : Neurological problems;Other  Lisa [name] 09/11/2015, 4:18 PM

## 2015-09-11 NOTE — Progress Notes (Signed)
[place] PHYSICAL MEDICINE & REHABILITATION     PROGRESS NOTE    Subjective/Complaints: Up at sink brushing teeth. Stated she slept very well after midnight. Back/leg aren't bothering her much this am   ROS still somewhat limited due to cognitive  deficits  Objective: Vital Signs: Blood pressure 136/53, pulse 74, temperature 98.5 F (36.9 C), temperature source Oral, resp. rate 18, height 5\' 3"  (1.6 m), weight 64.7 kg (142 lb 10.2 oz), SpO2 98 %. No results found. No results for input(s): WBC, HGB, HCT, PLT in the last 72 hours.  Recent Labs  09/09/15 0615  CREATININE 1.43*   CBG (last 3)  No results for input(s): GLUCAP in the last 72 hours.  Wt Readings from Last 3 Encounters:  09/11/15 64.7 kg (142 lb 10.2 oz)  08/22/15 67 kg (147 lb 11.3 oz)  08/03/15 65.772 kg (145 lb)    Physical Exam:  Constitutional: She is oriented to person, place, and time. She appears well-developed and well-nourished. Looks alert    HENT:  Head: Normocephalic and atraumatic.  Right Ear: No decreased hearing is noted.  Left Ear: No decreased hearing is noted.  Mouth/Throat: Mucous membranes still dry. Debris on teeth as well..    Eyes: Conjunctivae are normal. Pupils are equal, round, and reactive to light.   Neck: Normal range of motion. Neck supple.  Cardiovascular: Normal rate and regular rhythm.  Respiratory: Effort normal and breath sounds normal. No respiratory distress. She has no wheezes.  GI: Soft. Bowel sounds are normal. She exhibits no distension. There is no tenderness. There is no rebound.  Musculoskeletal: low back tender with truncal movement and palpation  Neurological: She is much alert, impaired memory but improving.   Left facial weakness but improved speech. Better insight Skin: Skin is warm and dry.  Abrasions improving on legs   Motor 4/5 UE's. LE's 2-3/5 at hips/knees partly due to pain. 4/5 ankles     Sensation intact to light touch in bilateral upper  and lower limbs Skin:abrasions healing  Assessment/Plan: 1. Weakness, balance and cognitive deficits secondary to traumatic brain injury which require 3  hours per day of interdisciplinary therapy in a comprehensive inpatient rehab setting. (reduced to allow for fatigue,poor activity tolerance thus far) Physiatrist is providing close team supervision and 24 hour management of active medical problems listed below. Physiatrist and rehab team continue to assess barriers to discharge/monitor patient progress toward functional and medical goals.  Function:  Bathing Bathing position   Position: Shower  Bathing parts Body parts bathed by patient: Right arm, Left arm, Chest, Abdomen, Right upper leg, Left upper leg, Front perineal area, Buttocks Body parts bathed by helper: Right lower leg, Left lower leg, Back  Bathing assist Assist Level: Touching or steadying assistance(Pt > 75%)      Upper Body Dressing/Undressing Upper body dressing   What is the patient wearing?: Pull over shirt/dress Bra - Perfomed by patient: Thread/unthread right bra strap, Thread/unthread left bra strap Bra - Perfomed by helper: Hook/unhook bra (pull down sports bra) Pull over shirt/dress - Perfomed by patient: Thread/unthread right sleeve, Thread/unthread left sleeve, Put head through opening, Pull shirt over trunk Pull over shirt/dress - Perfomed by helper: Pull shirt over trunk        Upper body assist Assist Level: Supervision or verbal cues   Set up : To obtain clothing/put away  Lower Body Dressing/Undressing Lower body dressing   What is the patient wearing?: Underwear, Pants, Socks, Shoes Underwear - Performed by  patient: Pull underwear up/down, Thread/unthread left underwear leg Underwear - Performed by helper: Thread/unthread right underwear leg Pants- Performed by patient: Thread/unthread left pants leg, Pull pants up/down Pants- Performed by helper: Thread/unthread right pants leg   Non-skid  slipper socks- Performed by helper: Don/doff right sock, Don/doff left sock Socks - Performed by patient: Don/doff left sock Socks - Performed by helper: Don/doff right sock Shoes - Performed by patient: Don/doff right shoe, Don/doff left shoe Shoes - Performed by helper: Don/doff right shoe, Don/doff left shoe, Fasten right, Fasten left     TED Hose - Performed by patient: Don/doff right TED hose, Don/doff left TED hose TED Hose - Performed by helper: Don/doff right TED hose, Don/doff left TED hose  Lower body assist Assist for lower body dressing: Touching or steadying assistance (Pt > 75%)      Toileting Toileting Toileting activity did not occur: No continent bowel/bladder event Toileting steps completed by patient: Performs perineal hygiene, Adjust clothing prior to toileting Toileting steps completed by helper: Adjust clothing prior to toileting, Adjust clothing after toileting Toileting Assistive Devices: Grab bar or rail  Toileting assist Assist level: Touching or steadying assistance (Pt.75%)   Transfers Chair/bed transfer   Chair/bed transfer method: Stand pivot Chair/bed transfer assist level: Touching or steadying assistance (Pt > 75%) Chair/bed transfer assistive device: Armrests     Locomotion Ambulation Ambulation activity did not occur: Refused   Max distance: 50 Assist level: Touching or steadying assistance (Pt > 75%)   Wheelchair   Type: Manual Max wheelchair distance: 100 Assist Level: Supervision or verbal cues  Cognition Comprehension Comprehension assist level: Understands basic 75 - 89% of the time/ requires cueing 10 - 24% of the time  Expression Expression assist level: Expresses basic 90% of the time/requires cueing < 10% of the time.  Social Interaction Social Interaction assist level: Interacts appropriately 50 - 74% of the time - May be physically or verbally inappropriate.  Problem Solving Problem solving assist level: Solves basic 75 - 89% of  the time/requires cueing 10 - 24% of the time  Memory Memory assist level: Recognizes or recalls 50 - 74% of the time/requires cueing 25 - 49% of the time   Medical Problem List and Plan: 1. Cognitive deficits, gait disorder secondary to Traumatic frontal intracranial hemorrhage, subarachnoid hemorrhage and subdural hemorrhage  -therapy at 3hr per day due to poor activity tolerance  -dc plan now SNF 2. DVT Prophylaxis/Anticoagulation: Right calf DVT without propagation---eliquis, asymptomatic    3. 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 Last [facility] 07/16/15.   -scheduled hydrocodone stopped due to cognition     4. Mood: LCSW to follow for evaluation and support.  5. Neuropsych: This patient is not fully capable of making decisions on her own behalf.  -safety plan to prevent fall.   -seroquel  qhs has been effective     6. Skin/Wound Care: Routine pressure relief measures. Maintain adequate nutrition and hyrdration status.  7. Fluids/Electrolytes/Nutrition: Continue Dysphagia 2 thins, fluid intake ok, solid intake poor.  -added megace for appetite --intake has been generally better but still borderline 8. Acute on chronic renal failure: continue to push po fluids-  9. HTN: Monitor tid for now.  10. Acute on chronic Iron deficiency anemia: continue iron supplement.  11. Aspiration PNA/H flu PNA: Has completed antibiotic course.  12. 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 Continue amiodarone daily and lopressor bid.  To follow up with [facility] after discharge.  13. Rectal bleeding: Has resolved--without  recurrence. Will need follow up evaluation after discharge.  14. Depression: Used to take Celexa and Abilify--stopped abilify (cost issue) now on seroquel  15. Urinary retention?: foley out/ voiding trial  -100k E Coli, fungal UTI---abx completed 16. Cellulitis right forearm:  -area  improved   LOS (Days) 20 A FACE TO FACE EVALUATION WAS PERFORMED  SWARTZ,ZACHARY T 09/11/2015 8:57 AM

## 2015-09-11 NOTE — Progress Notes (Signed)
Nutrition Follow-up  DOCUMENTATION CODES:   Not applicable  INTERVENTION:  Continue Ensure Enlive po TID, each supplement provides 350 kcal and 20 grams of protein.  Continue 30 ml Prostat po once daily, each supplement provides 100 kcal and 15 grams of protein.   Encourage adequate PO intake.   NUTRITION DIAGNOSIS:   Inadequate oral intake related to poor appetite as evidenced by meal completion < 25%; ongoing  GOAL:   Patient will meet greater than or equal to 90% of their needs; progressing  MONITOR:   PO intake, Supplement acceptance, Diet advancement, Weight trends, I & O's, Labs, Skin  REASON FOR ASSESSMENT:   Malnutrition Screening Tool    ASSESSMENT:   81 y.o. Left handed female with history of HTN, depression, spinal stenosis with BLE weakness and post laminectomy syndrome with chronic pain who was admitted on 08/03/15 after a fall in parking lot and subsequent acute traumatic SAH along frontal convexities and acute SDH with confusion and waxing and waining of mental status.  She continues to have bouts of lethargy and follow up CT 11/27 showed overall improvement with near resolution of SAH and bifrontal parenchymal hemorrhage as well as subtle new 3 mm midline shift.  Meal completion has been 10-75%. Pt has Ensure and Prostat ordered and has been consuming most of them. RD to continue with current orders. Weight has been stable. Pt encouraged to eat her food at meals and to consume her supplements.   Diet Order:  DIET DYS 3 Room service appropriate?: Yes; Fluid consistency:: Thin  Skin:  Wound (see comment) (Stage II pressure ulcer on coccyx)  Last BM:  12/20  Height:   Ht Readings from Last 1 Encounters:  08/22/15 5\' 3"  (1.6 m)    Weight:   Wt Readings from Last 1 Encounters:  09/11/15 142 lb 10.2 oz (64.7 kg)    Ideal Body Weight:  52.2 kg  BMI:  Body mass index is 25.27 kg/(m^2).  Estimated Nutritional Needs:   Kcal:  1600-1800  Protein:   75-85 grams  Fluid:  1.6 -1.8 L/day  EDUCATION NEEDS:   No education needs identified at this time  Roslyn SmilingStephanie Yamili Lichtenwalner, MS, RD, LDN Pager # [phone] After hours/ weekend pager # [phone]

## 2015-09-11 NOTE — Progress Notes (Signed)
Physical Therapy Discharge Summary  Patient Details  Name: Lisa [name] MRN: [id number] Date of Birth: [birth year]  Today's Date: 09/11/2015 PT Individual Time: 0910-1015 PT Individual Time Calculation (min): 65 min    Patient has met 12 of 12 long term goals due to improved activity tolerance, improved balance, improved postural control, increased strength, increased range of motion, ability to compensate for deficits, functional use of  right upper extremity, right lower extremity, left upper extremity and left lower extremity, improved attention, improved awareness and improved coordination.  Patient to discharge at a household ambulatory level Supervision-min A.   Patient's care partner unavailable to provide the necessary physical and cognitive assistance at discharge, therefore discharge planned to skilled nursing facility.   Reasons goals not met: NA  Recommendation:  Patient will benefit from ongoing skilled PT services in skilled nursing facility setting to continue to advance safe functional mobility, address ongoing impairments in standing balance, strength, activity tolerance, and minimize fall risk.  Equipment: No equipment provided  Reasons for discharge: treatment goals met and discharge from hospital  Patient/family agrees with progress made and goals achieved: Yes  Skilled Therapeutic Intervention Patient missed 10 min skilled therapy at beginning of session due to need for catheterization. Patient seen with son and granddaughter present to observe with focus on functional mobility, standing balance, and activity tolerance. Patient at supervision level for ambulation up to 100 ft using RW in controlled and home environments, wheelchair propulsion x 150 ft, bed mobility, functional transfers including simulated car transfer to sedan height using RW, and negotiating up/down 4 (6") stairs and 8 (3") stairs using 2 rails for total of 12 stairs. Patient performed sit <> stand from  edge of bed to don pants with min A due to posterior lean. In day room, patient ambulated using RW to countertop with plants and performed side stepping to R and L to water plants with single UE support on RW with supervision and one LOB requiring mod A to recover when patient distracted by talking to primary OT. Reviewed OTAGO HEP with patient and son for strengthening and falls prevention. Patient left sitting in recliner with all needs within reach and family present.   PT Discharge Precautions/Restrictions Restrictions Weight Bearing Restrictions: No Pain Pain Assessment Pain Assessment: 0-10 Pain Score: 5  Pain Type: Chronic pain Pain Location: Back Pain Orientation: Lower Pain Descriptors / Indicators: Aching Pain Frequency: Intermittent Pain Onset: On-going Patients Stated Pain Goal: 3 Pain Intervention(s): Emotional support;Rest Vision/Perception   No change from baseline  Cognition Overall Cognitive Status: Impaired/Different from baseline Orientation Level: Oriented X4 Attention: Sustained Focused Attention: Appears intact Sustained Attention: Appears intact Memory: Impaired Memory Impairment: Decreased recall of new information;Decreased short term memory Decreased Short Term Memory: Verbal basic;Functional basic Awareness: Appears intact Problem Solving: Impaired Problem Solving Impairment: Functional basic [employer].es Scales of Cognitive Functioning: Confused/appropriate Sensation Sensation Light Touch: Impaired by gross assessment Stereognosis: Not tested Hot/Cold: Appears Intact Proprioception: Appears Intact Additional Comments: intermittent numbness in bilateral LEs Coordination Gross Motor Movements are Fluid and Coordinated: Yes Fine Motor Movements are Fluid and Coordinated: Yes Motor  Motor Motor: Within Functional Limits Motor - Discharge Observations: generalized weakness  Mobility Bed Mobility Bed Mobility: Rolling Right;Rolling  Left;Supine to Sit;Sit to Supine Rolling Right: 5: Supervision;With rail Rolling Left: 5: Supervision;With rail Supine to Sit: 5: Supervision;With rails Sit to Supine: 5: Supervision;With rail Transfers Transfers: Yes Sit to Stand: 5: Supervision;With upper extremity assist;With armrests;From bed;From chair/3-in-1 Stand to Sit: 5:  Supervision;With upper extremity assist;With armrests;To chair/3-in-1;To bed Locomotion  Ambulation Ambulation: Yes Ambulation/Gait Assistance: 5: Supervision Ambulation Distance (Feet): 100 Feet Assistive device: Rolling walker Gait Gait: Yes Gait Pattern: Impaired Gait Pattern: Step-through pattern;Decreased stride length;Shuffle;Trunk flexed Gait velocity: 10 MWT = 0.21 m/s Stairs / Additional Locomotion Stairs: Yes Stairs Assistance: 5: Supervision Stair Management Technique: Two rails;Step to pattern;Forwards Number of Stairs: 12 Height of Stairs: 3 (8 3", 4 6" steps) Wheelchair Mobility Wheelchair Mobility: Yes Wheelchair Assistance: 5: Careers information officer: Both upper extremities Wheelchair Parts Management: Needs assistance Distance: 150 ft  Trunk/Postural Assessment  Cervical Assessment Cervical Assessment: Exceptions to [facility] (forward head) Thoracic Assessment Thoracic Assessment: Exceptions to [facility] (kyphotic) Lumbar Assessment Lumbar Assessment: Exceptions to WFL (decreased mobility) Postural Control Postural Control: Deficits on evaluation Protective Responses: delayed  Balance Standardized Balance Assessment Standardized Balance Assessment: Timed Up and Go Test Timed Up and Go Test TUG: Normal TUG (with RW) Normal TUG (seconds): 80 Static Sitting Balance Static Sitting - Balance Support: Feet supported Static Sitting - Level of Assistance: 6: Modified independent (Device/Increase time) Static Standing Balance Static Standing - Balance Support: During functional activity;Bilateral upper extremity supported Static  Standing - Level of Assistance: 5: Stand by assistance Dynamic Standing Balance Dynamic Standing - Balance Support: During functional activity;Bilateral upper extremity supported Dynamic Standing - Level of Assistance: 5: Stand by assistance Extremity Assessment  RUE Assessment RUE Assessment: Within Functional Limits LUE Assessment LUE Assessment: Within Functional Limits RLE Assessment RLE Assessment: Exceptions to [facility] RLE Strength RLE Overall Strength: Deficits RLE Overall Strength Comments: grossly 4+ to 5/5 throughout except 3+/5 ankle PF LLE Assessment LLE Assessment: Exceptions to [facility] LLE Strength LLE Overall Strength: Deficits LLE Overall Strength Comments: grossly 4+ to 5/5 throughout except 3+/5 ankle PF   See Function Navigator for Current Functional Status.  Carney Living A 09/11/2015, 11:10 AM

## 2015-09-11 NOTE — Progress Notes (Signed)
Speech Language Pathology Daily Session Note  Patient Details  Name: Lisa [name] MRN: [id number] Date of Birth: 1927/07/27  Today's Date: 09/11/2015 SLP Individual Time: 1300-1330; 1430- 1455 SLP Individual Time Calculation (min): 30 min; 25 min   Short Term Goals: Week 3: SLP Short Term Goal 1 (Week 3): Patient will demonstrate efficient mastication with minimal oral residue with trials of Dys. 3 textures over 2 consecutive sessions prior to advancement with minimal overt s/s of aspiration and supervision verbal cues.   SLP Short Term Goal 2 (Week 3): Patient will selective attention to a functional task in a minimally distracting enviornment for 10 minutes with Mod A verbal cues for redirection.  SLP Short Term Goal 3 (Week 3): Patient will demonstrate functional problem solving for basic and familiar tasks with Min A verbal cues.  SLP Short Term Goal 4 (Week 3): Patient will identify 2 physical and 2 cognitive deficits with Mod A question and semantic cues.  SLP Short Term Goal 5 (Week 3): Patient will utilize external memory aids to recall new, daily information with Mod A multimodal cues.   Skilled Therapeutic Interventions:  Session 1: Pt was seen for skilled ST targeting dysphagia goals.  SLP completed skilled observations during presentations of pt's currently prescribed diet to assess toleration.  Pt demonstrated effective mastication and complete clearance of residuals from the oral cavity post swallow with supervision cues for use of swallowing precautions.  SLP increased task challenge with therapist directed distractions to address use of swallowing precautions in light of decreased selective attention to task.  Pt was able to utilize swallowing precautions while selectively attending to her meal for >10 minute intervals with supervision cues.   Recommend that pt continue on her currently prescribed diet.    Session 2:  Pt was seen for skilled ST targeting cognitive goals.  Pt was  received reclined in bed but was easily awakened and agreeable to participate in ST.  Pt initiated a request to brush her hair and apply lipstick prior to going to [facility] holiday party.  She was able to complete the aforementioned self care tasks with overall set up assist for functional problem solving, no cues needed for redirection to tasks.  Pt propelled her wheelchair to the dayroom with min assist verbal cues for route recall.  She selectively attended to conversations with her family members who were present as well as with the therapist in a highly distracting environment for >10 minute intervals with min assist verbal cues for redirection. Pt was left in day room at party with family members present. Of note, pt was observed consuming regular textures during this afternoon's session and demonstrated no overt s/s of aspiration with solids or difficulty masticating advanced textures.  Anticipate that pt will be able to be advanced to regular textures; however, pt is awaiting transfer to SNF tomorrow and SLP will defer diet recommendations to SLP at next level of care.       Function:  Eating Eating   Modified Consistency Diet: Yes Eating Assist Level: More than reasonable amount of time;Supervision or verbal cues;Set up assist for   Eating Set Up Assist For: Opening containers       Cognition Comprehension Comprehension assist level: Understands basic 90% of the time/cues < 10% of the time  Expression   Expression assist level: Expresses basic 90% of the time/requires cueing < 10% of the time.  Social Interaction Social Interaction assist level: Interacts appropriately 90% of the time - Needs monitoring or  encouragement for participation or interaction.  Problem Solving Problem solving assist level: Solves basic 75 - 89% of the time/requires cueing 10 - 24% of the time  Memory Memory assist level: Recognizes or recalls 50 - 74% of the time/requires cueing 25 - 49% of the time    Pain Pain  Assessment (Session 1)  Pain Assessment: No/denies pain Pain Assessment (Session 2)  Pain Assessment: No/denies pain   Therapy/Group: Individual Therapy  PageMelanee Spry, Cassandr Cederberg L 09/11/2015, 4:43 PM

## 2015-09-11 NOTE — Progress Notes (Signed)
Social Work Patient ID: Lisa SnellenFaye K [name], female   DOB: 01/29/1927, 81 y.o.   MRN: [id number]  Anselm PancoastLucy R Dontay Harm, LCSW Social Worker Signed  Patient Care Conference 09/11/2015  4:18 PM    Expand All Collapse All   Inpatient RehabilitationTeam Conference and Plan of Care Update Date: 09/11/2015   Time: 2:50 PM     Patient Name: Lisa [name]       Medical Record Number: [id number]  Date of Birth: 02/05/1927 Sex: Female         Room/Bed: 4W18C/4W18C-01 Payor Info: Payor: MEDICARE / Plan: MEDICARE PART A AND B / Product Type: *No Product type* /    Admitting Diagnosis: TBI after a fall   Admit Date/Time:  08/22/2015  2:45 PM Admission Comments: No comment available   Primary Diagnosis:  TBI (traumatic brain injury) (HCC) Principal Problem: TBI (traumatic brain injury) [facility](HCC)    Patient Active Problem List     Diagnosis  Date Noted   .  Bacterial UTI  08/27/2015   .  DVT, lower extremity (HCC)  08/27/2015   .  TBI (traumatic brain injury) (HCC)  08/22/2015   .  ICH (intracerebral hemorrhage) (HCC)     .  Abscess     .  Hypomagnesemia     .  SDH (subdural hematoma) (HCC)     .  Boil of upper extremity     .  Pneumonia due to Haemophilus influenzae (HCC)     .  Chronic systolic CHF (congestive heart failure) (HCC)     .  Acute on chronic renal failure (HCC)     .  Hypokalemia     .  Bright red blood per rectum     .  Dysphagia     .  Pressure ulcer  08/16/2015   .  Acute respiratory failure with hypoxemia (HCC)     .  Hypernatremia     .  Subdural hematoma (HCC)  08/03/2015   .  Lumbar radiculopathy  06/06/2015   .  Spinal stenosis, lumbar region, with neurogenic claudication  04/02/2015   .  Degenerative lumbar disc  02/15/2015   .  Lumbar post-laminectomy syndrome  02/15/2015   .  Facet syndrome, lumbar  02/15/2015   .  Sacroiliac joint dysfunction  02/15/2015     Expected Discharge Date: Expected Discharge Date: 09/12/15 (likely to change to SNF)  Team Members Present:       Current Status/Progress  Goal  Weekly Team Focus   Medical     improved cognition, sleep. moving better as a whole, maintaining hydration on own  stabilize medically for transfer  nutrition   Bowel/Bladder     continent of bladder, incontinent of bowel   continent of bowela nd bladder with mod assist   monitor PVR's, OOB to BR for all toileting    Swallow/Nutrition/ Hydration     Dys. 3 textures with thin liquids, Min A for use of swallowing strategies   Min A   tolerance of current diet, trials of upgraded textures    ADL's     BADLs-supervision UB, mod A LB ; functional transfers-steady A; functional amb with RW; steady A; toileting-mod A  min A overall  activity tolerance, safety awareness, funcitonal mobility, BADL retraining   Mobility     stand pivot transfers with min A, sit <> stand, standing balance, and gait up to 85 ft with S-min A, supervision bed mobility and wheelchair propulsion  min A overall short  distance household gait  functional transfers, standing balance, standing tolerance, gait, strengthening, activity tolerance, participation, pt/family education    Communication               Safety/Cognition/ Behavioral Observations    Min-Mod A   min assist  attention, problem solving, recall and awareness    Pain     chronic back pain, recent complaint of abdominal pain (patient thinks it is gas)  pain less than or equalt o 4 on a scale of 0-10  assess pain q4h and prn, medicate as indicated    Skin     MASD to bilateral buttocks  no new skin breakdown/injury  keep buttocks clean and dry, apply barrier cream , assess skin q shift    Rehab Goals Patient on target to meet rehab goals: Yes Rehab Goals Revised: progress and participation is much improved. *See Care Plan and progress notes for long and short-term goals.    Barriers to Discharge:  see prior     Possible Resolutions to Barriers:   see prior, SNF placement     Discharge Planning/Teaching Needs:   Plan has  changed to SNF and hope to have bed secured this week for transfer.       Team Discussion:    SW reports SNF bed available tomorrow.  MD clearance given.   Revisions to Treatment Plan:    DC plan had changed to SNF.  Bed available tomorrow.    Continued Need for Acute Rehabilitation Level of Care: The patient requires daily medical management by a physician with specialized training in physical medicine and rehabilitation for the following conditions: Daily direction of a multidisciplinary physical rehabilitation program to ensure safe treatment while eliciting the highest outcome that is of practical value to the patient.: Yes Daily medical management of patient stability for increased activity during participation in an intensive rehabilitation regime.: Yes Daily analysis of laboratory values and/or radiology reports with any subsequent need for medication adjustment of medical intervention for : Neurological problems;Other  Sheriann Newmann 09/11/2015, 4:18 PM                 Anselm Pancoast, LCSW Social Worker Signed  Patient Care Conference 08/30/2015  4:27 PM    Expand All Collapse All   Inpatient RehabilitationTeam Conference and Plan of Care Update Date: 08/28/2015   Time: 2:45 PM     Patient Name: Lisa [name]       Medical Record Number: [id number]  Date of Birth: Nov 26, 1926 Sex: Female         Room/Bed: 4W18C/4W18C-01 Payor Info: Payor: MEDICARE / Plan: MEDICARE PART A AND B / Product Type: *No Product type* /    Admitting Diagnosis: TBI after a fall   Admit Date/Time:  08/22/2015  2:45 PM Admission Comments: No comment available   Primary Diagnosis:  TBI (traumatic brain injury) (HCC) Principal Problem: TBI (traumatic brain injury) [facility])    Patient Active Problem List     Diagnosis  Date Noted   .  Bacterial UTI  08/27/2015   .  DVT, lower extremity (HCC)  08/27/2015   .  TBI (traumatic brain injury) (HCC)  08/22/2015   .  ICH (intracerebral hemorrhage) (HCC)     .   Abscess     .  Hypomagnesemia     .  SDH (subdural hematoma) (HCC)     .  Boil of upper extremity     .  Pneumonia due to Haemophilus influenzae (HCC)     .  Chronic systolic CHF (congestive heart failure) (HCC)     .  Acute on chronic renal failure (HCC)     .  Hypokalemia     .  Bright red blood per rectum     .  Dysphagia     .  Pressure ulcer  08/16/2015   .  Acute respiratory failure with hypoxemia (HCC)     .  Hypernatremia     .  Subdural hematoma (HCC)  08/03/2015   .  Lumbar radiculopathy  06/06/2015   .  Spinal stenosis, lumbar region, with neurogenic claudication  04/02/2015   .  Degenerative lumbar disc  02/15/2015   .  Lumbar post-laminectomy syndrome  02/15/2015   .  Facet syndrome, lumbar  02/15/2015   .  Sacroiliac joint dysfunction  02/15/2015     Expected Discharge Date: Expected Discharge Date: 09/12/15  Team Members Present: Physician leading conference: Dr. Faith Rogue Social Worker Present: Amada Jupiter, LCSW Nurse Present: Chana Bode, RN PT Present: Teodoro Kil, PT;Rebecca Evelina Bucy, PT OT Present: Callie Fielding, OT;Ardis Rowan, COTA SLP Present: Feliberto Gottron, SLP PPS Coordinator present : Tora Duck, RN, CRRN        Current Status/Progress  Goal  Weekly Team Focus   Medical     TBI, slow to progress, hs confusion likely exacerbated by UTI   improve mentation, awareness  sleep, rx UTiI, improve hydraition and overall nutrition    Bowel/Bladder     foley incontinent at times of bowel LBM 12-4   manage bowel and bladder   discontinue foley   Swallow/Nutrition/ Hydration     Dys. 1 textures with honey-thick liquids, Mod A for use of swallow strategies  Supervision with least restrictive diet   Trials of upgraded liquids/textures, Repeat MBS   ADL's     functional transfers-tot A + 2, UB bathing/dressing-min A; LB bathing/dressing-max A; limited activity tolerance; standing balance-max A  min A overall  activity tolerance, standing balance,  funcitonal transfers, BADL retraining   Mobility     squat pivot/stand pivot transfers with max A to +2A, supervision wheelchair mobility   min A transfers and short distance household ambulation   participation, initation, functional transfers, standing balance, initiating ambulation, strengthening, activity tolerance, pt/family education   Communication               Safety/Cognition/ Behavioral Observations    max assist will get up without assist mod- max stand pivot    min assist  reinforce safety needs work on attention , functional problem solving , awaress and recall   Pain     low back pain , headaches, nausea vicodin 1 po scheduled and every 6 hours  less than 4   monitor effectiveness of pain meds     Skin     buttocks red allevyn dressing applied skin to bilateral arms fragile   no new breakdown   monitor buttocks and turn every 2 hours as patient allows while in bed    Rehab Goals Patient on target to meet rehab goals: Yes *See Care Plan and progress notes for long and short-term goals.    Barriers to Discharge:  age, neurological deficits     Possible Resolutions to Barriers:   supervision and assistance by family at discharge, ongoing NMR, rx medical issues     Discharge Planning/Teaching Needs:   Plan for pt to d/c to her home with 24/7 care being arranged (she does have a LTC policy) by son.  Teaching  to be scheduled closer to d/c.    Team Discussion:    Voicing better but different presentation today with ST - some s/s of aphasia?  Some changes in behavior as well - MD aware.  PT has had difficulty getting pt to participate.  Requires encouragement.  Min assist goals overall.  SW reports son is working on care at home.   Revisions to Treatment Plan:    NA    Continued Need for Acute Rehabilitation Level of Care: The patient requires daily medical management by a physician with specialized training in physical medicine and rehabilitation for the following  conditions: Daily direction of a multidisciplinary physical rehabilitation program to ensure safe treatment while eliciting the highest outcome that is of practical value to the patient.: Yes Daily analysis of laboratory values and/or radiology reports with any subsequent need for medication adjustment of medical intervention for : Neurological problems;Other;Post surgical problems  Audianna Landgren 08/30/2015, 4:27 PM

## 2015-09-11 NOTE — Progress Notes (Signed)
Occupational Therapy Discharge Summary  Patient Details  Name: JEANE CASHATT MRN: [id number] Date of Birth: 03-29-1927   Patient has met 8 of 8 long term goals due to improved activity tolerance, improved balance, postural control, ability to compensate for deficits and improved awareness.  Pt made steady progress with BADLs during this admission.  Pt is supervision/min A for BADLs with min verbal cues for task initiation and safety awareness. Pt continues to require more than a reasonable amount of time to complete tasks with multiple rest breaks. Patient to discharge at [facility] Assist level.  Patient's care partner unavailable to provide the necessary physical and cognitive assistance at discharge.     Recommendation:  Patient will benefit from ongoing skilled OT services in skilled nursing facility setting to continue to advance functional skills in the area of BADL.  Equipment: No equipment provided discharge to SNF  Reasons for discharge: treatment goals met  Patient/family agrees with progress made and goals achieved: Yes  OT Discharge     Vision/Perception  Vision- History Baseline Vision/History: Wears glasses Wears Glasses: Reading only Patient Visual Report: No change from baseline Vision- Assessment Vision Assessment?: No apparent visual deficits  Cognition Overall Cognitive Status: Impaired/Different from baseline Orientation Level: Oriented X4 Attention: Sustained Focused Attention: Appears intact Sustained Attention: Appears intact Memory: Impaired Memory Impairment: Decreased recall of new information;Decreased short term memory Decreased Short Term Memory: Verbal basic;Functional basic Awareness: Appears intact Problem Solving: Impaired Problem Solving Impairment: Functional basic [employer].es Scales of Cognitive Functioning: Confused/appropriate Sensation Sensation Light Touch: Impaired by gross assessment Stereognosis: Not tested Hot/Cold:  Appears Intact Proprioception: Appears Intact Additional Comments: intermittent numbness in bilateral LEs Coordination Gross Motor Movements are Fluid and Coordinated: Yes Fine Motor Movements are Fluid and Coordinated: Yes Motor  Motor Motor: Within Functional Limits Motor - Discharge Observations: generalized weakness Mobility  Bed Mobility Bed Mobility: Rolling Right;Rolling Left;Supine to Sit;Sit to Supine Rolling Right: 5: Supervision;With rail Rolling Left: 5: Supervision;With rail Supine to Sit: 5: Supervision;With rails Sit to Supine: 5: Supervision;With rail Transfers Sit to Stand: 5: Supervision;With upper extremity assist;With armrests;From bed;From chair/3-in-1 Stand to Sit: 5: Supervision;With upper extremity assist;With armrests;To chair/3-in-1;To bed  Trunk/Postural Assessment  Cervical Assessment Cervical Assessment: Exceptions to [facility] (forward head) Thoracic Assessment Thoracic Assessment: Exceptions to [facility] (kyphotic) Lumbar Assessment Lumbar Assessment: Exceptions to [facility] (decreased mobility) Postural Control Postural Control: Deficits on evaluation Protective Responses: delayed  Balance Standardized Balance Assessment Standardized Balance Assessment: Timed Up and Go Test Timed Up and Go Test TUG: Normal TUG (with RW) Normal TUG (seconds): 80 Static Sitting Balance Static Sitting - Balance Support: Feet supported Static Sitting - Level of Assistance: 6: Modified independent (Device/Increase time) Static Standing Balance Static Standing - Balance Support: During functional activity;Bilateral upper extremity supported Static Standing - Level of Assistance: 5: Stand by assistance Dynamic Standing Balance Dynamic Standing - Balance Support: During functional activity;Bilateral upper extremity supported Dynamic Standing - Level of Assistance: 5: Stand by assistance Extremity/Trunk Assessment RUE Assessment RUE Assessment: Within Functional Limits LUE  Assessment LUE Assessment: Within Functional Limits   See Function Navigator for Current Functional Status.  Leotis Shames [facility] 09/11/2015, 2:52 PM

## 2015-09-12 ENCOUNTER — Encounter
Admission: RE | Admit: 2015-09-12 | Discharge: 2015-09-12 | Disposition: A | Discharge status: Home / Self Care | Payer: Medicare Other | Source: Ambulatory Visit | Attending: Internal Medicine | Admitting: Internal Medicine

## 2015-09-12 DIAGNOSIS — F409 Phobic anxiety disorder, unspecified: Secondary | ICD-10-CM | POA: Diagnosis present

## 2015-09-12 DIAGNOSIS — F32A Depression, unspecified: Secondary | ICD-10-CM | POA: Diagnosis present

## 2015-09-12 DIAGNOSIS — F329 Major depressive disorder, single episode, unspecified: Secondary | ICD-10-CM | POA: Diagnosis present

## 2015-09-12 DIAGNOSIS — K5900 Constipation, unspecified: Secondary | ICD-10-CM | POA: Diagnosis not present

## 2015-09-12 DIAGNOSIS — F5105 Insomnia due to other mental disorder: Secondary | ICD-10-CM

## 2015-09-12 LAB — CBC
HEMATOCRIT: 31.2 % — AB (ref 36.0–46.0)
HEMOGLOBIN: 10.1 g/dL — AB (ref 12.0–15.0)
MCH: 30.8 pg (ref 26.0–34.0)
MCHC: 32.4 g/dL (ref 30.0–36.0)
MCV: 95.1 fL (ref 78.0–100.0)
Platelets: 271 10*3/uL (ref 150–400)
RBC: 3.28 MIL/uL — ABNORMAL LOW (ref 3.87–5.11)
RDW: 17 % — ABNORMAL HIGH (ref 11.5–15.5)
WBC: 6.3 10*3/uL (ref 4.0–10.5)

## 2015-09-12 LAB — BASIC METABOLIC PANEL
ANION GAP: 8 (ref 5–15)
BUN: 21 mg/dL — ABNORMAL HIGH (ref 6–20)
CALCIUM: 8.7 mg/dL — AB (ref 8.9–10.3)
CHLORIDE: 100 mmol/L — AB (ref 101–111)
CO2: 26 mmol/L (ref 22–32)
CREATININE: 1.3 mg/dL — AB (ref 0.44–1.00)
GFR calc non Af Amer: 36 mL/min — ABNORMAL LOW (ref >60)
GFR, EST AFRICAN AMERICAN: 42 mL/min — AB (ref >60)
Glucose, Bld: 93 mg/dL (ref 65–99)
Potassium: 3.4 mmol/L — ABNORMAL LOW (ref 3.5–5.1)
SODIUM: 134 mmol/L — AB (ref 135–145)

## 2015-09-12 MED ORDER — SENNOSIDES-DOCUSATE SODIUM 8.6-50 MG PO TABS: 1.0000 | ORAL_TABLET | Freq: Every day | ORAL | Status: DC

## 2015-09-12 MED ORDER — SENNOSIDES-DOCUSATE SODIUM 8.6-50 MG PO TABS: 2.0000 | ORAL_TABLET | Freq: Two times a day (BID) | ORAL | Status: DC

## 2015-09-12 MED ORDER — MEGESTROL ACETATE 400 MG/10ML PO SUSP: 400.0000 mg | Freq: Every day | ORAL | Status: DC

## 2015-09-12 MED ORDER — DIBUCAINE 1 % RE OINT
TOPICAL_OINTMENT | Freq: Two times a day (BID) | RECTAL | Status: DC
Start: 2015-09-12 — End: 2015-09-12
  Filled 2015-09-12: qty 28

## 2015-09-12 MED ORDER — ENSURE ENLIVE PO LIQD: 237.0000 mL | Freq: Three times a day (TID) | ORAL | Status: DC

## 2015-09-12 MED ORDER — ACETAMINOPHEN 325 MG PO TABS: 325.0000 mg | ORAL_TABLET | ORAL | Status: DC | PRN

## 2015-09-12 MED ORDER — ALPRAZOLAM 0.25 MG PO TABS: 0.2500 mg | ORAL_TABLET | Freq: Three times a day (TID) | ORAL | Status: DC | PRN

## 2015-09-12 MED ORDER — PANTOPRAZOLE SODIUM 20 MG PO TBEC: 20.0000 mg | DELAYED_RELEASE_TABLET | Freq: Two times a day (BID) | ORAL | Status: DC

## 2015-09-12 MED ORDER — POTASSIUM CHLORIDE CRYS ER 20 MEQ PO TBCR
20.0000 meq | EXTENDED_RELEASE_TABLET | Freq: Every day | ORAL | Status: DC
  Administered 2015-09-12: 20 meq via ORAL
  Filled 2015-09-12: qty 1

## 2015-09-12 MED ORDER — APIXABAN 5 MG PO TABS: 5.0000 mg | ORAL_TABLET | Freq: Two times a day (BID) | ORAL | Status: DC

## 2015-09-12 MED ORDER — PRAMOXINE HCL 1 % RE FOAM: 1.0000 "application " | Freq: Two times a day (BID) | RECTAL | Status: DC

## 2015-09-12 MED ORDER — POTASSIUM CHLORIDE CRYS ER 20 MEQ PO TBCR: 20.0000 meq | EXTENDED_RELEASE_TABLET | Freq: Every day | ORAL | Status: DC

## 2015-09-12 MED ORDER — HYDROCODONE-ACETAMINOPHEN 5-325 MG PO TABS: 1.0000 | ORAL_TABLET | Freq: Four times a day (QID) | ORAL | Status: DC | PRN

## 2015-09-12 MED ORDER — QUETIAPINE FUMARATE 50 MG PO TABS: 50.0000 mg | ORAL_TABLET | Freq: Every day | ORAL | Status: DC

## 2015-09-12 NOTE — Discharge Summary (Signed)
Physician Discharge Summary  Patient ID: Lisa [name] MRN: [id number] DOB/AGE: 79/05/1927 79 y.o.  Admit date: 08/22/2015 Discharge date: 09/12/2015  Discharge Diagnoses:  Principal Problem:   TBI (traumatic brain injury) [facility](HCC) Active Problems:   Lumbar post-laminectomy syndrome   Spinal stenosis, lumbar region, with neurogenic claudication   Chronic systolic CHF (congestive heart failure) (HCC)   Bacterial UTI   DVT, lower extremity (HCC)   Constipation   Insomnia due to anxiety and fear   Depression, controlled   Discharged Condition: Stable  Significant Diagnostic Studies: Dg Forearm Right  08/18/2015  CLINICAL DATA:  Right mid forearm swelling, possible abscess EXAM: RIGHT FOREARM - 2 VIEW COMPARISON:  None. FINDINGS: Two views of the right forearm submitted. No acute fracture or subluxation. Soft tissue swelling mid forearm. Cellulitis or dermatitis cannot be excluded. Clinical correlation is necessary. IMPRESSION: No acute fracture or subluxation. No evidence of osteomyelitis. Soft tissue swelling mid forearm. Cellulitis or dermatitis cannot be excluded. Electronically Signed   By: Natasha MeadLiviu  Pop M.D.   On: 08/18/2015 18:36   Ct Head Wo Contrast  08/30/2015  CLINICAL DATA:  Fatigue, confusion EXAM: CT HEAD WITHOUT CONTRAST TECHNIQUE: Contiguous axial images were obtained from the base of the skull through the vertex without intravenous contrast. COMPARISON:  08/19/2015 FINDINGS: Calvarium is intact. Subtle opacified left mastoid air cells are again seen, stable. Air-fluid level left maxillary sinus stable. Diffuse atrophy with white matter low attenuation stable. Particular low-attenuation left frontal lobe and to a lesser degree right frontal lobe consistent with known bifrontal edema, improving. Improving bifrontal contusions only minimally visible currently. Mild approximately 2 mm of midline shift toward the left, stable to slightly improved. Low-attenuation subdural collection on  the right no change from prior study. No evidence of acute intracranial hemorrhage or extra-axial collection. IMPRESSION: Continued improvement in bilateral contusions and associated edema with anticipated evolution of right subdural collection. No acute findings. Electronically Signed   By: Esperanza Heiraymond  Rubner M.D.   On: 08/30/2015 17:40      Labs:  Basic Metabolic Panel:  Recent Labs Lab 09/06/15 0654 09/09/15 0615 09/12/15 0649  NA 134*  --  134*  K 3.6  --  3.4*  CL 96*  --  100*  CO2 28  --  26  GLUCOSE 92  --  93  BUN 14  --  21*  CREATININE 1.41* 1.43* 1.30*  CALCIUM 8.7*  --  8.7*    CBC: CBC Latest Ref Rng 09/12/2015 09/04/2015 08/30/2015  WBC 4.0 - 10.5 K/uL 6.3 6.6 6.6  Hemoglobin 12.0 - 15.0 g/dL 10.1(L) 10.3(L) 10.8(L)  Hematocrit 36.0 - 46.0 % 31.2(L) 33.3(L) 35.5(L)  Platelets 150 - 400 K/uL 271 291 288     CBG: No results for input(s): GLUCAP in the last 168 hours.  Brief HPI:   Lisa [name] is a 81 y.o. Left handed female with history of HTN, depression, spinal stenosis with BLE weakness and post laminectomy syndrome with chronic pain who was admitted on 08/03/15 after a fall with subsequent TBI and  delayed development of bilateral frontal ICH hematomas with edema and continued right SDH.Conservative management recommended by Dr. Alphonzo Lemmingsabbel with follow up CT 11/27 showing stability.   Hospital course complicated by wide complex tachycardia with cardio-pulmonary arrest. She was intubated on 11/16 and started on IV antibiotics for H Flu PNA.   She was started on IV amiodarone and lopressor management of NSVT. Noted to have elevated troponins but not a candidate for invasive work  up per cardiology.  2 D echo done revealing EF 15-20% with akinesis of entire anteroseptal myocardium likely due to stress induced cardiomyopathy. Follow up echo on 11/23 showed improvement in EF to 25%. She tolerated extubation without difficulty and was started on modified diet. She continued to  have bouts of lethargy with confusion, balance deficits with difficulty standing and cognitive deficits.  CIR was recommended for follow up therapy.    Hospital Course: Lisa [name] was admitted to rehab 08/22/2015 for inpatient therapies to consist of PT, ST and OT at least three hours five days a week. Past admission physiatrist, therapy team and rehab RN have worked together to provide customized collaborative inpatient rehab. She continued have bouts of lethargy with poor po intake. Acute on chronic renal failure was monitored and patient was provided with multiple supplements to help with intake. Renal status has been monitored along and is currently at baseline. She was found to have E coli UTI and was treated with 7 day course of Keflex. Foley was discontinued as mentation improved and she is voiding without difficulty.  Chronic CHF has been monitored with daily weights and has been compensated.   BLE dopplers done 12/2 showed evidence of right gastrocnemius and peroneal DVT as well as question of distal bilateral femoral DVTs. She was cleared to start anticoagulation by NS and were done eliquis was added for treatment. Repeat dopplers 12/9 was negative for femoral DVT and no propagation of RLE DVT noted. Follow up CCT was done on 12/8 due to fluctuating cognition and showed continued improvement.  She has had sundowning and sleep wake disruption affecting endurance. She was started on Seroquel at nights and this has helped manage insomnia.  She continues to have back pain and was premedicated with hydrocodone to help activity tolerance. Repeat MBS was done on 12/7 showing improvement in swallow function and diet has gradually been advanced to dysphagia 3, thin liquids. Megace was added to help with appetite and has been effective.   Follow up labs today show hypokalemia and she was started on supplement. Mild hyponatremia is stable and acute on chronic renal failure has resolved with creatinine  trending to baseline. Recommend recheck BMET in 3-5 days and discontinue K dur if hypokalemia has resolved. She does have bouts of anxiety and team has provided ego support as well as encouragement to help manage symptoms. She been tolerating increase in activity level and no cardiac symptoms reported with activity.  She did report abdominal pain due to constipation and was disimpacted prior to discharge. Bowel program was adjusted to two senna at bedtime and recommend suppository every three days if patient does not have good BM.  She continues to require min assist overall with ADL tasks, mobility and for cognitive tasks. Family is unable to provide assistance needed and has elected on SNF for further therapy and she was discharged to [facility] on 09/12/15.    Rehab course: During patient's stay in rehab weekly team conferences were held to monitor patient's progress, set goals and discuss barriers to discharge. She has had improvement in activity tolerance, balance, postural control, as well as ability to compensate for deficits.  She is has had improvement in functional use LUE  and /LLE as well as improved awareness She is able to complete bathing with min assist, Upper dressing with supervision and cues and lower body dressing with min assist. She requires more than reasonable amount of time to complete ADL task. She requires supervision to min  assist for standing balance due to occasional posterior lean with LOB requiring moderate assist to recover. She is ambulating household distances with supervision to min assist.  She is demonstrating behaviors consistent with a Rancho Level VI-emerging VII and requires overall Min-Mod A for alternating attention, functional problem solving, recall of functional information, emergent awareness and overall safety. She is tolerating dysphagia 3 diet with thin liquids with minimal signs of over aspiration and is modified independent to supervision to recall safe  swallow strategies.    Disposition: Skilled Nursing Facility  Diet: Dysphagia 3. Thin liquids.   Special Instructions: 1. Recheck BMET in  3-5 days and discontinue K dur if hypokalemia has resolved.  2. Toilet patient every 4 hours while awake. 3. Needs supervision with meals.       Discharge Instructions    Ambulatory referral to Physical Medicine Rehab    Complete by:  As directed   4 week follow up after TBI/Is going to [facility] SNF            Medication List    STOP taking these medications        aspirin EC 81 MG tablet     calcium-vitamin D 500-200 MG-UNIT tablet  Commonly known as:  OSCAL WITH D     cholecalciferol 1000 UNITS tablet  Commonly known as:  VITAMIN D     doxycycline 50 MG capsule  Commonly known as:  VIBRAMYCIN     furosemide 20 MG tablet  Commonly known as:  LASIX     ICAPS AREDS 2 Caps      TAKE these medications        acetaminophen 325 MG tablet  Commonly known as:  TYLENOL  Take 1-2 tablets (325-650 mg total) by mouth every 4 (four) hours as needed for mild pain.     ALPRAZolam 0.25 MG tablet  Commonly known as:  XANAX  Take 1 tablet (0.25 mg total) by mouth every 8 (eight) hours as needed for anxiety.     amiodarone 100 MG tablet  Commonly known as:  PACERONE  Take 1 tablet (100 mg total) by mouth daily.     apixaban 5 MG Tabs tablet  Commonly known as:  ELIQUIS  Take 1 tablet (5 mg total) by mouth 2 (two) times daily.     citalopram 20 MG tablet  Commonly known as:  CELEXA  Take 1 tablet (20 mg total) by mouth daily.     feeding supplement (ENSURE ENLIVE) Liqd  Take 237 mLs by mouth 3 (three) times daily between meals.     HYDROcodone-acetaminophen 5-325 MG tablet  Commonly known as:  NORCO/VICODIN  Take 1 tablet by mouth every 6 (six) hours as needed for moderate pain.     megestrol 400 MG/10ML suspension  Commonly known as:  MEGACE  Take 10 mLs (400 mg total) by mouth daily.     metoprolol tartrate 25 MG tablet   Commonly known as:  LOPRESSOR  Take 0.5 tablets (12.5 mg total) by mouth 2 (two) times daily with a meal.     pantoprazole 20 MG tablet  Commonly known as:  PROTONIX  Take 1 tablet (20 mg total) by mouth 2 (two) times daily.     potassium chloride SA 20 MEQ tablet  Commonly known as:  K-DUR,KLOR-CON  Take 1 tablet (20 mEq total) by mouth daily.     pramoxine 1 % foam  Commonly known as:  PROCTOFOAM  Place 1 application rectally 2 (two) times daily.  For three days then use as needed.     QUEtiapine 50 MG tablet  Commonly known as:  SEROQUEL  Take 1 tablet (50 mg total) by mouth at bedtime.     senna-docusate 8.6-50 MG tablet  Commonly known as:  Senokot-S  Take 2 tablets by mouth 2 (two) times daily.       Follow-up Information    Follow up with Ranelle Oyster, MD.   Specialty:  Physical Medicine and Rehabilitation   Why:  office to call you with appointment   Contact information:   [address], [address] [place] [address] [phone]       Follow up with Tonny Bollman, MD. Call today.   Specialty:  Cardiology   Why:  for follow up appointment   Contact information:   [address] [place] [address] [phone]       Signed: Jacquelynn Cree 09/12/2015, 2:14 PM

## 2015-09-12 NOTE — Progress Notes (Signed)
Speech Language Pathology Discharge Summary  Patient Details  Name: Lisa [name] MRN: [id number] Date of Birth: [birth year]  Patient has met 6 of 6 long term goals.  Patient to discharge at overall Min;Mod level.   Reasons goals not met: N/A   Clinical Impression/Discharge Summary: Patient has made functional gains and has met 5 of 5 LTG's this admission due to increased cognitive and swallowing function.  Currently, patient is demonstrating behaviors consistent with a Rancho Level VI-emerging VII and requires overall Min-Mod A for alternating attention, functional problem solving, recall of functional information, emergent awareness and overall safety. Patient is also consuming Dys. 3 textures with thin liquids with minimal overt s/s of aspiration and is overall supervision-Mod I for use of swallowing compensatory strategies.  Patient's family is unable to provide the necessary physical and cognitive assistance needed at this time, therefore, patient will be discharged to a SNF. Patient would benefit from f/u SLP services to maximize her cognitive and swallowing function and overall functional independence in order to reduce caregiver burden.   Care Partner:  Caregiver Able to Provide Assistance: No  Type of Caregiver Assistance: Physical;Cognitive  Recommendation:  24 hour supervision/assistance;Skilled Nursing facility  Rationale for SLP Follow Up: Maximize cognitive function and independence;Maximize swallowing safety;Reduce caregiver burden   Equipment: N/A   Reasons for discharge: Treatment goals met;Discharged from hospital   Patient/Family Agrees with Progress Made and Goals Achieved: Yes     [place], [place] 09/12/2015, 7:37 AM

## 2015-09-12 NOTE — Progress Notes (Signed)
Report called to Anell BarrShirley Albert, L[facility] in [place]. Reported that patient had received Flu vaccine PTA. Report to receiving LPN that patient had been impacted today with large amount of stool and required manual disimpaction and Fleets enema prior to being discharged from CIR. [facility] to [facility] with paperwork and belongings.

## 2015-09-12 NOTE — Progress Notes (Signed)
Social Work  Discharge Note  The overall goal for the admission was met for:   Discharge location: No - d/c plan changed to SNF  Length of Stay: Yes - 21 days  Discharge activity level: Yes - minimal assistance  Home/community participation: Yes  Services provided included: MD, RD, PT, OT, SLP, RN, TR, Pharmacy, Neuropsych and SW  Financial Services: Medicare and Private Insurance: [place]  Follow-up services arranged: Other: SNF placement at [place]  Comments (or additional information):  Patient/Family verbalized understanding of follow-up arrangements: Yes  Individual responsible for coordination of the follow-up plan: pt/son  Confirmed correct DME delivered: NA    Refujio Haymer

## 2015-09-12 NOTE — Progress Notes (Signed)
[facility] PHYSICAL MEDICINE & REHABILITATION     PROGRESS NOTE    Subjective/Complaints: A little frustrated that her back is bothering but otherwise feeling better. Knows she's going to SNF today  Objective: Vital Signs: Blood pressure 155/49, pulse 84, temperature 98.7 F (37.1 C), temperature source Oral, resp. rate 18, height  (1.6 m), weight 63.8 kg (140 lb 10.5 oz), SpO2 97 %. No results found.  Recent Labs  09/12/15 0649  WBC 6.3  HGB 10.1*  HCT 31.2*  PLT 271    Recent Labs  09/12/15 0649  NA 134*  K 3.4*  CL 100*  GLUCOSE 93  BUN 21*  CREATININE 1.30*  CALCIUM 8.7*   CBG (last 3)  No results for input(s): GLUCAP in the last 72 hours.  Wt Readings from Last 3 Encounters:  09/12/15 63.8 kg (140 lb 10.5 oz)  08/22/15 67 kg (147 lb 11.3 oz)  08/03/15 65.772 kg (145 lb)    Physical Exam:  Constitutional: She is oriented to person, place, and time. She appears well-developed and well-nourished. Looks alert    HENT:  Head: Normocephalic and atraumatic.  Right Ear: No decreased hearing is noted.  Left Ear: No decreased hearing is noted.  Mouth/Throat: Mucous membranes still dry. Debris on teeth as well..    Eyes: Conjunctivae are normal. Pupils are equal, round, and reactive to light.   Neck: Normal range of motion. Neck supple.  Cardiovascular: Normal rate and regular rhythm.  Respiratory: Effort normal and breath sounds normal. No respiratory distress. She has no wheezes.  GI: Soft. Bowel sounds are normal. She exhibits no distension. There is no tenderness. There is no rebound.  Musculoskeletal: low back tender with truncal movement and palpation  Neurological: She is much alert, impaired memory but improving.   Left facial weakness but improved speech. Better insight Skin: Skin is warm and dry.  Abrasions improving on legs   Motor 4/5 UE's. LE's 2-3/5 at hips/knees partly due to pain. 4/5 ankles     Sensation intact to light touch in  bilateral upper and lower limbs Skin:abrasions healing  Assessment/Plan: 1. Weakness, balance and cognitive deficits secondary to traumatic brain injury which require 3  hours per day of interdisciplinary therapy in a comprehensive inpatient rehab setting. (reduced to allow for fatigue,poor activity tolerance thus far) Physiatrist is providing close team supervision and 24 hour management of active medical problems listed below. Physiatrist and rehab team continue to assess barriers to discharge/monitor patient progress toward functional and medical goals.  Function:  Bathing Bathing position   Position: Shower  Bathing parts Body parts bathed by patient: Right arm, Left arm, Chest, Abdomen, Right upper leg, Left upper leg, Front perineal area, Buttocks Body parts bathed by helper: Right lower leg, Left lower leg, Back  Bathing assist Assist Level: Touching or steadying assistance(Pt > 75%)      Upper Body Dressing/Undressing Upper body dressing   What is the patient wearing?: Pull over shirt/dress Bra - Perfomed by patient: Thread/unthread right bra strap, Thread/unthread left bra strap Bra - Perfomed by helper: Hook/unhook bra (pull down sports bra) Pull over shirt/dress - Perfomed by patient: Thread/unthread right sleeve, Thread/unthread left sleeve, Put head through opening, Pull shirt over trunk Pull over shirt/dress - Perfomed by helper: Pull shirt over trunk        Upper body assist Assist Level: Supervision or verbal cues   Set up : To obtain clothing/put away  Lower Body Dressing/Undressing Lower body dressing  What is the patient wearing?: Underwear, Pants, Socks, Shoes Underwear - Performed by patient: Pull underwear up/down, Thread/unthread left underwear leg Underwear - Performed by helper: Thread/unthread right underwear leg Pants- Performed by patient: Thread/unthread left pants leg, Pull pants up/down, Thread/unthread right pants leg Pants- Performed by helper:  Thread/unthread right pants leg   Non-skid slipper socks- Performed by helper: Don/doff right sock, Don/doff left sock Socks - Performed by patient: Don/doff left sock Socks - Performed by helper: Don/doff right sock Shoes - Performed by patient: Don/doff right shoe, Don/doff left shoe Shoes - Performed by helper: Don/doff right shoe, Don/doff left shoe, Fasten right, Fasten left     TED Hose - Performed by patient: Don/doff right TED hose, Don/doff left TED hose TED Hose - Performed by helper: Don/doff right TED hose, Don/doff left TED hose  Lower body assist Assist for lower body dressing: Touching or steadying assistance (Pt > 75%)      Toileting Toileting Toileting activity did not occur: No continent bowel/bladder event Toileting steps completed by patient: Performs perineal hygiene Toileting steps completed by helper: Adjust clothing prior to toileting, Adjust clothing after toileting Toileting Assistive Devices: Grab bar or rail  Toileting assist Assist level: Touching or steadying assistance (Pt.75%)   Transfers Chair/bed transfer   Chair/bed transfer method: Stand pivot, Ambulatory Chair/bed transfer assist level: Supervision or verbal cues Chair/bed transfer assistive device: Armrests, Patent attorneyWalker     Locomotion Ambulation Ambulation activity did not occur: Refused   Max distance: 100 Assist level: Supervision or verbal cues   Wheelchair   Type: Manual Max wheelchair distance: 150 Assist Level: Supervision or verbal cues  Cognition Comprehension Comprehension assist level: Understands basic 90% of the time/cues < 10% of the time  Expression Expression assist level: Expresses basic 90% of the time/requires cueing < 10% of the time.  Social Interaction Social Interaction assist level: Interacts appropriately 90% of the time - Needs monitoring or encouragement for participation or interaction.  Problem Solving Problem solving assist level: Solves basic 75 - 89% of the  time/requires cueing 10 - 24% of the time  Memory Memory assist level: Recognizes or recalls 50 - 74% of the time/requires cueing 25 - 49% of the time   Medical Problem List and Plan: 1. Cognitive deficits, gait disorder secondary to Traumatic frontal intracranial hemorrhage, subarachnoid hemorrhage and subdural hemorrhage  -to SNF today 2. DVT Prophylaxis/Anticoagulation: Right calf DVT without propagation---eliquis, asymptomatic----would continue eliquis until patient is more mobile and discharged from SNF   3. 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 [facility].   -scheduled hydrocodone stopped due to cognition     4. Mood: LCSW to follow for evaluation and support.  5. Neuropsych: This patient is not fully capable of making decisions on her own behalf.  -safety plan to prevent fall.   -seroquel 50mg  qhs has been effective---would continue for the near future--can review at office with me as outpt     6. Skin/Wound Care: Routine pressure relief measures. Maintain adequate nutrition and hyrdration status.  7. Fluids/Electrolytes/Nutrition: Continue Dysphagia 2 thins, fluid intake ok, solid intake poor.  -added megace for appetite --intake has been generally better but still borderline 8. Acute on chronic renal failure: continue to push po fluids-  9. HTN: Monitor tid for now.  10. Acute on chronic Iron deficiency anemia: continue iron supplement.  11. Aspiration PNA/H flu PNA: Has completed antibiotic course.  12. Shocked heart syndrome  with wide complex tachycardia: Monitor HR tid. Continue amiodarone daily and lopressor bid. To follow up with [facility] after discharge.  13. Rectal bleeding: Has resolved--without  recurrence. Will need follow up evaluation after discharge.  14. Depression: Used to take Celexa and Abilify--stopped abilify (cost issue) now on seroquel  15. Urinary retention?: foley out/ voiding  trial  -100k E Coli, fungal UTI---abx completed 16. Cellulitis right forearm:  -area improved   LOS (Days) 21 A FACE TO FACE EVALUATION WAS PERFORMED  Lisa [name] 09/12/2015 9:05 AM

## 2015-09-17 LAB — BASIC METABOLIC PANEL
ANION GAP: 7 (ref 5–15)
BUN: 13 mg/dL (ref 6–20)
CALCIUM: 8.6 mg/dL — AB (ref 8.9–10.3)
CO2: 22 mmol/L (ref 22–32)
CREATININE: 1.2 mg/dL — AB (ref 0.44–1.00)
Chloride: 109 mmol/L (ref 101–111)
GFR calc Af Amer: 46 mL/min — ABNORMAL LOW (ref >60)
GFR, EST NON AFRICAN AMERICAN: 40 mL/min — AB (ref >60)
GLUCOSE: 79 mg/dL (ref 65–99)
Potassium: 4.5 mmol/L (ref 3.5–5.1)
Sodium: 138 mmol/L (ref 135–145)

## 2015-09-24 ENCOUNTER — Encounter
Admission: RE | Admit: 2015-09-24 | Discharge: 2015-09-24 | Disposition: A | Discharge status: Home / Self Care | Payer: Medicare Other | Source: Ambulatory Visit | Attending: Internal Medicine | Admitting: Internal Medicine

## 2015-09-24 DIAGNOSIS — R109 Unspecified abdominal pain: Secondary | ICD-10-CM | POA: Insufficient documentation

## 2015-09-30 LAB — URINALYSIS COMPLETE WITH MICROSCOPIC (ARMC ONLY)
Bilirubin Urine: NEGATIVE
Glucose, UA: NEGATIVE mg/dL
HGB URINE DIPSTICK: NEGATIVE
Ketones, ur: NEGATIVE mg/dL
Nitrite: NEGATIVE
PH: 5 (ref 5.0–8.0)
PROTEIN: NEGATIVE mg/dL
Specific Gravity, Urine: 1.012 (ref 1.005–1.030)

## 2015-10-01 LAB — URINE CULTURE

## 2015-10-02 LAB — URINALYSIS COMPLETE WITH MICROSCOPIC (ARMC ONLY)
Bilirubin Urine: NEGATIVE
Glucose, UA: NEGATIVE mg/dL
HGB URINE DIPSTICK: NEGATIVE
Ketones, ur: NEGATIVE mg/dL
NITRITE: NEGATIVE
PH: 5 (ref 5.0–8.0)
PROTEIN: NEGATIVE mg/dL
SPECIFIC GRAVITY, URINE: 1.011 (ref 1.005–1.030)
Squamous Epithelial / LPF: NONE SEEN

## 2015-10-05 LAB — URINE CULTURE: Culture: >=100000

## 2015-10-10 ENCOUNTER — Emergency Department: Payer: Medicare Other

## 2015-10-10 ENCOUNTER — Emergency Department
Admission: EM | Admit: 2015-10-10 | Discharge: 2015-10-10 | Disposition: A | Discharge status: Home / Self Care | Payer: Medicare Other | Attending: Emergency Medicine | Admitting: Emergency Medicine

## 2015-10-10 ENCOUNTER — Encounter: Payer: Self-pay | Admitting: *Deleted

## 2015-10-10 DIAGNOSIS — I10 Essential (primary) hypertension: Secondary | ICD-10-CM | POA: Diagnosis not present

## 2015-10-10 DIAGNOSIS — R062 Wheezing: Secondary | ICD-10-CM | POA: Diagnosis present

## 2015-10-10 DIAGNOSIS — Z79899 Other long term (current) drug therapy: Secondary | ICD-10-CM | POA: Insufficient documentation

## 2015-10-10 DIAGNOSIS — J40 Bronchitis, not specified as acute or chronic: Secondary | ICD-10-CM | POA: Insufficient documentation

## 2015-10-10 HISTORY — DX: Ventricular tachycardia, unspecified: I47.20

## 2015-10-10 HISTORY — DX: Disorder of kidney and ureter, unspecified: N28.9

## 2015-10-10 HISTORY — DX: Ventricular tachycardia: I47.2

## 2015-10-10 HISTORY — DX: Nontraumatic intracerebral hemorrhage, unspecified: I61.9

## 2015-10-10 LAB — CBC
HCT: 34.8 % — ABNORMAL LOW (ref 35.0–47.0)
Hemoglobin: 11.3 g/dL — ABNORMAL LOW (ref 12.0–16.0)
MCH: 30.1 pg (ref 26.0–34.0)
MCHC: 32.4 g/dL (ref 32.0–36.0)
MCV: 93 fL (ref 80.0–100.0)
PLATELETS: 254 10*3/uL (ref 150–440)
RBC: 3.74 MIL/uL — AB (ref 3.80–5.20)
RDW: 16.3 % — AB (ref 11.5–14.5)
WBC: 9.9 10*3/uL (ref 3.6–11.0)

## 2015-10-10 LAB — BASIC METABOLIC PANEL
Anion gap: 7 (ref 5–15)
BUN: 17 mg/dL (ref 6–20)
CALCIUM: 8.7 mg/dL — AB (ref 8.9–10.3)
CO2: 19 mmol/L — AB (ref 22–32)
CREATININE: 1.43 mg/dL — AB (ref 0.44–1.00)
Chloride: 112 mmol/L — ABNORMAL HIGH (ref 101–111)
GFR calc non Af Amer: 32 mL/min — ABNORMAL LOW (ref >60)
GFR, EST AFRICAN AMERICAN: 37 mL/min — AB (ref >60)
GLUCOSE: 91 mg/dL (ref 65–99)
Potassium: 3.5 mmol/L (ref 3.5–5.1)
Sodium: 138 mmol/L (ref 135–145)

## 2015-10-10 LAB — TROPONIN I
TROPONIN I: 0.04 ng/mL — AB (ref <0.031)
Troponin I: <0.03 ng/mL (ref <0.031)

## 2015-10-10 LAB — BRAIN NATRIURETIC PEPTIDE: B Natriuretic Peptide: 104 pg/mL — ABNORMAL HIGH (ref 0.0–100.0)

## 2015-10-10 MED ORDER — ACETAMINOPHEN 325 MG PO TABS
650.0000 mg | ORAL_TABLET | Freq: Once | ORAL | Status: AC
  Administered 2015-10-10: 650 mg via ORAL
  Filled 2015-10-10: qty 2

## 2015-10-10 MED ORDER — ALBUTEROL SULFATE HFA 108 (90 BASE) MCG/ACT IN AERS: 2.0000 | INHALATION_SPRAY | RESPIRATORY_TRACT | Status: DC | PRN

## 2015-10-10 MED ORDER — PREDNISONE 20 MG PO TABS: ORAL_TABLET | ORAL | Status: AC

## 2015-10-10 MED ORDER — IPRATROPIUM-ALBUTEROL 0.5-2.5 (3) MG/3ML IN SOLN
3.0000 mL | Freq: Once | RESPIRATORY_TRACT | Status: AC
  Administered 2015-10-10: 3 mL via RESPIRATORY_TRACT
  Filled 2015-10-10: qty 3

## 2015-10-10 NOTE — Discharge Instructions (Signed)
Use albuterol as needed for cough.   Take prednisone as prescribed.  See your doctor.   Return to ER if you have worse shortness of breath, cough, wheezing, fevers.

## 2015-10-10 NOTE — ED Notes (Signed)
Caregiver states she notcied some wheezing and  SOB this AM with excretion, pt awake and alert, states she feels somewhat SOB, awake and alert

## 2015-10-10 NOTE — ED Notes (Signed)
Patient tolerated breathing treatment well. Caregiver at bedside. Awaiting x-ray.

## 2015-10-10 NOTE — ED Provider Notes (Signed)
CSN: [id number]     Arrival date & time 10/10/15  1057 History   First MD Initiated Contact with Patient 10/10/15 1141     Chief Complaint  Patient presents with  . Wheezing     (Consider location/radiation/quality/duration/timing/severity/associated sxs/prior Treatment) The history is provided by the patient.  Lisa [name] is a 82 y.o. female history of hypertension, intra parenchymal hemorrhage from fall, CHF here presenting with cough and wheezing. Patient was recently admitted for intracerebral hemorrhage and had a prolonged hospitalization and eventually developed CHF as well as DVT and is now on eliquis. She just went home from rehab 3 days ago. Today, she woke up and felt weak. She has some cough and wheezing as per aid. Denies fever, had some shortness of breath.    Past Medical History  Diagnosis Date  . Allergy   . Pelvis fracture (HCC)   . Wrist fracture, right   . Depression   . Spinal stenosis   . Osteoarthritis   . Hypertension   . Foot fracture     bilateral  . Brain bleed (HCC)   . V-tach (HCC)   . CHF (congestive heart failure) (HCC)   . Renal disorder    Past Surgical History  Procedure Laterality Date  . Back surgery N/A   . Breast lumpectomy N/A    Family History  Problem Relation Age of Onset  . COPD Mother   . COPD Father   . Heart disease Father    Social History  Substance Use Topics  . Smoking status: Never Smoker   . Smokeless tobacco: Never Used  . Alcohol Use: No   OB History    No data available     Review of Systems  Respiratory: Positive for cough and wheezing.   All other systems reviewed and are negative.     Allergies  Ambien; Daypro; Oxycodone; Shellfish allergy; Ace inhibitors; and Norvasc  Home Medications   Prior to Admission medications   Medication Sig Start Date End Date Taking? Authorizing Provider  acetaminophen (TYLENOL) 325 MG tablet Take 1-2 tablets (325-650 mg total) by mouth every 4 (four) hours as needed  for mild pain. 09/12/15   Jacquelynn Cree, PA-C  ALPRAZolam (XANAX) 0.25 MG tablet Take 1 tablet (0.25 mg total) by mouth every 8 (eight) hours as needed for anxiety. 09/12/15   Jacquelynn Cree, PA-C  amiodarone (PACERONE) 100 MG tablet Take 1 tablet (100 mg total) by mouth daily. 08/22/15   Richarda Overlie, MD  apixaban (ELIQUIS) 5 MG TABS tablet Take 1 tablet (5 mg total) by mouth 2 (two) times daily. 09/12/15   Jacquelynn Cree, PA-C  citalopram (CELEXA) 20 MG tablet Take 1 tablet (20 mg total) by mouth daily. 08/22/15   Richarda Overlie, MD  feeding supplement, ENSURE ENLIVE, (ENSURE ENLIVE) LIQD Take 237 mLs by mouth 3 (three) times daily between meals. 09/12/15   Jacquelynn Cree, PA-C  HYDROcodone-acetaminophen (NORCO/VICODIN) 5-325 MG tablet Take 1 tablet by mouth every 6 (six) hours as needed for moderate pain. 09/12/15   Jacquelynn Cree, PA-C  megestrol (MEGACE) 400 MG/10ML suspension Take 10 mLs (400 mg total) by mouth daily. 09/12/15   Jacquelynn Cree, PA-C  metoprolol tartrate (LOPRESSOR) 25 MG tablet Take 0.5 tablets (12.5 mg total) by mouth 2 (two) times daily with a meal. 08/22/15   Richarda Overlie, MD  pantoprazole (PROTONIX) 20 MG tablet Take 1 tablet (20 mg total) by mouth 2 (two) times daily. 09/12/15  Evlyn Kanner Love, PA-C  potassium chloride SA (K-DUR,KLOR-CON) 20 MEQ tablet Take 1 tablet (20 mEq total) by mouth daily. 09/12/15   Jacquelynn Cree, PA-C  pramoxine (PROCTOFOAM) 1 % foam Place 1 application rectally 2 (two) times daily. For three days then use as needed. 09/12/15   Evlyn Kanner Love, PA-C  QUEtiapine (SEROQUEL) 50 MG tablet Take 1 tablet (50 mg total) by mouth at bedtime. 09/12/15   Evlyn Kanner Love, PA-C  senna-docusate (SENOKOT-S) 8.6-50 MG tablet Take 2 tablets by mouth 2 (two) times daily. 09/12/15   Evlyn Kanner Love, PA-C   BP 137/52 mmHg  Pulse 89  Temp(Src) 98.2 F (36.8 C) (Oral)  Resp 18  Ht  (1.6 m)  Wt 140 lb (63.504 kg)  BMI 24.81 kg/m2  SpO2 99% Physical Exam  Constitutional:  She is oriented to person, place, and time.  Chronically ill, coughing   HENT:  Head: Normocephalic.  Mouth/Throat: Oropharynx is clear and moist.  Eyes: Conjunctivae are normal. Pupils are equal, round, and reactive to light.  Neck: Neck supple.  Cardiovascular: Normal rate and normal heart sounds.   Pulmonary/Chest:  Slightly tachypneic, minimal wheezing throughout. Dry crackles on the bases, worse on Right side   Abdominal: Soft. Bowel sounds are normal. She exhibits no distension. There is no tenderness. There is no rebound.  Musculoskeletal: Normal range of motion. She exhibits no edema or tenderness.  Neurological: She is alert and oriented to person, place, and time.  Skin: Skin is warm and dry.  Psychiatric: She has a normal mood and affect. Her behavior is normal. Thought content normal.  Nursing note and vitals reviewed.   ED Course  Procedures (including critical care time) Labs Review Labs Reviewed  BASIC METABOLIC PANEL - Abnormal; Notable for the following:    Chloride 112 (*)    CO2 19 (*)    Creatinine, Ser 1.43 (*)    Calcium 8.7 (*)    GFR calc non Af Amer 32 (*)    GFR calc Af Amer 37 (*)    All other components within normal limits  CBC - Abnormal; Notable for the following:    RBC 3.74 (*)    Hemoglobin 11.3 (*)    HCT 34.8 (*)    RDW 16.3 (*)    All other components within normal limits  TROPONIN I - Abnormal; Notable for the following:    Troponin I 0.04 (*)    All other components within normal limits  BRAIN NATRIURETIC PEPTIDE - Abnormal; Notable for the following:    B Natriuretic Peptide 104.0 (*)    All other components within normal limits  URINALYSIS COMPLETEWITH MICROSCOPIC ([facility] ONLY)  TROPONIN I    Imaging Review Dg Chest 2 View  10/10/2015  CLINICAL DATA:  Shortness of breath EXAM: CHEST  2 VIEW COMPARISON:  08/21/2015 chest radiograph. FINDINGS: Stable cardiomediastinal silhouette with mild cardiomegaly. No pneumothorax. No pleural  effusion. There are faint reticular opacities throughout the peripheral lungs bilaterally. No acute consolidative airspace disease. IMPRESSION: Stable mild cardiomegaly. Faint reticular opacities throughout the peripheral lungs bilaterally, which are nonspecific and could represent mild pulmonary edema, although the differential includes interstitial lung disease given the peripheral distribution. Electronically Signed   By: Delbert Phenix M.D.   On: 10/10/2015 13:40   I have personally reviewed and evaluated these images and lab results as part of my medical decision-making.   EKG Interpretation None      MDM   Final diagnoses:  None    Lisa [name] is a 82 y.o. female here with cough, wheezing, shortness of breath. On eliquis. I think likely bronchitis vs pneumonia vs CHF exacerbation. Will get labs, BNP, CXR.   3:27 PM Lab didn't run troponin initially so it was added on and was 0.04. It was 1.1 in November when she was admitted. CXR showed interstitial lung disease vs pulmonary edema but BNP 100. WBC nl. No evidence of pneumonia. No wheezing after a neb. The labs were drawn around 11 am so will get another troponin and if stable, can dc home. Signed out to Dr. Scotty Court.    Richardean Canal, MD 10/10/15 [phone]

## 2015-10-10 NOTE — ED Notes (Addendum)
Pt also states weakness and feeling tired recently, pt just came home from rehab after fall and brain bleed

## 2015-10-10 NOTE — ED Notes (Signed)
Troponin 0.04--dr yao informed.

## 2015-10-22 ENCOUNTER — Encounter: Payer: Self-pay | Admitting: Physical Medicine & Rehabilitation

## 2015-10-22 ENCOUNTER — Encounter: Payer: Medicare Other | Attending: Physical Medicine & Rehabilitation | Admitting: Physical Medicine & Rehabilitation

## 2015-10-22 VITALS — BP 137/70 | HR 90 | Resp 14

## 2015-10-22 DIAGNOSIS — M549 Dorsalgia, unspecified: Secondary | ICD-10-CM | POA: Insufficient documentation

## 2015-10-22 DIAGNOSIS — M7989 Other specified soft tissue disorders: Secondary | ICD-10-CM | POA: Diagnosis not present

## 2015-10-22 DIAGNOSIS — I5022 Chronic systolic (congestive) heart failure: Secondary | ICD-10-CM

## 2015-10-22 DIAGNOSIS — R4189 Other symptoms and signs involving cognitive functions and awareness: Secondary | ICD-10-CM | POA: Insufficient documentation

## 2015-10-22 DIAGNOSIS — I509 Heart failure, unspecified: Secondary | ICD-10-CM | POA: Insufficient documentation

## 2015-10-22 DIAGNOSIS — F329 Major depressive disorder, single episode, unspecified: Secondary | ICD-10-CM | POA: Diagnosis not present

## 2015-10-22 DIAGNOSIS — S069X0A Unspecified intracranial injury without loss of consciousness, initial encounter: Secondary | ICD-10-CM | POA: Diagnosis present

## 2015-10-22 DIAGNOSIS — I609 Nontraumatic subarachnoid hemorrhage, unspecified: Secondary | ICD-10-CM | POA: Diagnosis not present

## 2015-10-22 DIAGNOSIS — S069X3S Unspecified intracranial injury with loss of consciousness of 1 hour to 5 hours 59 minutes, sequela: Secondary | ICD-10-CM | POA: Diagnosis not present

## 2015-10-22 DIAGNOSIS — M961 Postlaminectomy syndrome, not elsewhere classified: Secondary | ICD-10-CM

## 2015-10-22 DIAGNOSIS — M47816 Spondylosis without myelopathy or radiculopathy, lumbar region: Secondary | ICD-10-CM

## 2015-10-22 DIAGNOSIS — M545 Low back pain: Secondary | ICD-10-CM

## 2015-10-22 DIAGNOSIS — G8929 Other chronic pain: Secondary | ICD-10-CM | POA: Insufficient documentation

## 2015-10-22 NOTE — Progress Notes (Signed)
Subjective:    Patient ID: Lisa [name], female    DOB: 1927/08/28, 82 y.o.   MRN: [id number]  HPI   Mrs. Gassett is here in follow up of her TBI. Therapy is now coming to the house. She was discharged from the SNF earlier this month. She has hired help at home currently 24/7. She is doing her bathing and dressing/toileting independently. Her caregiver helps fix meals and perform housekeeping. Also the caregiver helps with travel/transportation. She is in a chair most of the day. No falls have happened since she's been home. PT has noticed that she was dragging her right foot more last week although it has been a little better over the last several days.   Her appetite has been great. She is sleeping well. She has had some mild swelling in her feet which has been generally under control. She is not checking her weights consistently.    Review of Systems  Cardiovascular: Positive for leg swelling.  All other systems reviewed and are negative.      Objective:   Physical Exam   HENT:  Head: Normocephalic and atraumatic.  Right Ear: No decreased hearing is noted.  Left Ear: No decreased hearing is noted.  Mouth/Throat: Mucous membranes still dry. Debris on teeth as well..  Eyes: Conjunctivae are normal. Pupils are equal, round, and reactive to light.  Neck: Normal range of motion. Neck supple.  Cardiovascular: Normal rate and regular rhythm. trace edema either leg  Respiratory: Effort normal and breath sounds normal. No respiratory distress. She has no wheezes.  GI: Soft. Bowel sounds are normal. She exhibits no distension. There is no tenderness. There is no rebound.  Musculoskeletal: low back tender with truncal movement and palpation  Neurological: She is much alert, impaired memory but improving.Normal speech. Better insight Skin: Skin is warm and dry.  Abrasions resolved on legs  Motor 4/5 UE's. LE's 4/5 at hips/knees partly due to pain. 4/5 ankles  Sensation intact to  light touch in bilateral upper and lower limbs Skin:intact without breakdown       Assessment & Plan:   Medical Problem List and Plan: 1. 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 DVT Prophylaxis/Anticoagulation: Right calf DVT without propagation---eliquis,   -can dc as she's ambulating daily.  3. 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 Last [facility] 07/16/15.    -mgt per pain mgt doctor    4. Mood: LCSW to follow for evaluation and support.  5. Neuropsych:     -seroquel  qhs has been effective- may continue     7. Fluids/Electrolytes/Nutrition: solid intake. -can stop megace.     12. 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    14. Depression: Used to take Celexa and Abilify-- now on seroquel instead of abilify due to cost   Follow up with me in 3 months. Thirty minutes of face to face patient care time were spent during this visit. All questions were encouraged and answered.          Pain Inventory Average Pain 4 Pain Right Now 4 My pain is aching  In the last 24 hours, has pain interfered with the following? General activity no selection  Relation with others no selection Enjoyment of life no selection What TIME of day is your pain at its worst? evening Sleep (in general) Fair  Pain is worse with: some activites Pain improves with: medication Relief from Meds: 5  Mobility walk with assistance use a cane use a walker how many minutes can you walk? 10 ability to climb steps?  no use a  wheelchair  Function not employed: date last employed . disabled: date disabled .  Neuro/Psych numbness tingling  Prior Studies hospital f/u  Physicians involved in your care hospital f/u   Family History  Problem Relation Age of Onset  . COPD Mother   . COPD Father   . Heart disease Father    Social History   Social History  . Marital Status: Widowed    Spouse Name: N/A  . Number of Children: N/A  . Years of Education: N/A   Social History Main Topics  . Smoking status: Never Smoker   . Smokeless tobacco: Never Used  . Alcohol Use: No  . Drug Use: No  . Sexual Activity: Not Asked   Other Topics Concern  . None   Social History Narrative   Past Surgical History  Procedure Laterality Date  . Back surgery N/A   . Breast lumpectomy N/A    Past Medical History  Diagnosis Date  . Allergy   . Pelvis fracture (HCC)   . Wrist fracture, right   . Depression   . Spinal stenosis   . Osteoarthritis   . Hypertension   . Foot fracture     bilateral  . Brain bleed (HCC)   . V-tach (HCC)   . CHF (congestive heart failure) (HCC)   . Renal disorder    BP 137/70 mmHg  Pulse 90  Resp 14  SpO2 98%  Opioid Risk Score:   Fall Risk Score:  `1  Depression screen PHQ 2/9  Depression screen [facility] 2/9 10/22/2015 07/24/2015 07/16/2015 06/26/2015 05/22/2015 04/23/2015 04/02/2015  Decreased Interest 0 0 0 0 0 0 0  Down, Depressed, Hopeless 0 0 0 0 0 0 0  PHQ - 2 Score 0 0 0 0 0 0 0  Altered sleeping 0 - - - - - -  Tired, decreased energy 0 - - - - - -  Change in appetite 0 - - - - - -  Feeling bad or failure about yourself  0 - - - - - -  Trouble concentrating 0 - - - - - -  Moving slowly or fidgety/restless 0 - - - - - -  Suicidal thoughts 0 - - - - - -  PHQ-9 Score 0 - - - - - -

## 2015-10-22 NOTE — Patient Instructions (Addendum)
STOP THE ELIQUIS (BLOOD THINNER)  STOP MEGACE (APPETITE MEDICINE)----IF APPETITE FALLS OFF, YOU CAN RESUME AT HALF DOSE   CONTINUE WORKING ON YOUR WALKING AND LOWER EXTREMITY STRENGTHENING AT HOME.  KEEP FEET ELEVATED WHILE YOU SIT AND WHEN YOU SLEEP AT NIGHT. CONSIDER KNEE HIGH COMPRESSION STOCKINGS TO HELP CONTROL ANY SWELLING   CHECK YOUR WEIGHT DAILY!!   PLEASE CALL ME WITH ANY PROBLEMS OR QUESTIONS [phone]).

## 2015-10-23 ENCOUNTER — Encounter: Payer: Self-pay | Admitting: Emergency Medicine

## 2015-10-23 ENCOUNTER — Emergency Department
Admission: EM | Admit: 2015-10-23 | Discharge: 2015-10-23 | Disposition: A | Discharge status: Home / Self Care | Payer: Medicare Other | Attending: Emergency Medicine | Admitting: Emergency Medicine

## 2015-10-23 ENCOUNTER — Emergency Department: Payer: Medicare Other

## 2015-10-23 DIAGNOSIS — J9801 Acute bronchospasm: Secondary | ICD-10-CM | POA: Diagnosis not present

## 2015-10-23 DIAGNOSIS — I10 Essential (primary) hypertension: Secondary | ICD-10-CM | POA: Diagnosis not present

## 2015-10-23 DIAGNOSIS — R062 Wheezing: Secondary | ICD-10-CM | POA: Diagnosis present

## 2015-10-23 DIAGNOSIS — R0600 Dyspnea, unspecified: Secondary | ICD-10-CM

## 2015-10-23 LAB — BASIC METABOLIC PANEL
ANION GAP: 5 (ref 5–15)
BUN: 15 mg/dL (ref 6–20)
CO2: 27 mmol/L (ref 22–32)
Calcium: 9 mg/dL (ref 8.9–10.3)
Chloride: 110 mmol/L (ref 101–111)
Creatinine, Ser: 1.49 mg/dL — ABNORMAL HIGH (ref 0.44–1.00)
GFR calc Af Amer: 35 mL/min — ABNORMAL LOW (ref >60)
GFR calc non Af Amer: 31 mL/min — ABNORMAL LOW (ref >60)
GLUCOSE: 119 mg/dL — AB (ref 65–99)
POTASSIUM: 3.5 mmol/L (ref 3.5–5.1)
Sodium: 142 mmol/L (ref 135–145)

## 2015-10-23 LAB — CBC WITH DIFFERENTIAL/PLATELET
BASOS ABS: 0.1 10*3/uL (ref 0–0.1)
Basophils Relative: 1 %
Eosinophils Absolute: 0.2 10*3/uL (ref 0–0.7)
Eosinophils Relative: 2 %
HEMATOCRIT: 34.7 % — AB (ref 35.0–47.0)
Hemoglobin: 11.5 g/dL — ABNORMAL LOW (ref 12.0–16.0)
LYMPHS PCT: 22 %
Lymphs Abs: 2.1 10*3/uL (ref 1.0–3.6)
MCH: 30.7 pg (ref 26.0–34.0)
MCHC: 33.1 g/dL (ref 32.0–36.0)
MCV: 92.7 fL (ref 80.0–100.0)
MONO ABS: 0.8 10*3/uL (ref 0.2–0.9)
Monocytes Relative: 9 %
NEUTROS ABS: 6.3 10*3/uL (ref 1.4–6.5)
Neutrophils Relative %: 66 %
Platelets: 275 10*3/uL (ref 150–440)
RBC: 3.74 MIL/uL — AB (ref 3.80–5.20)
RDW: 15.6 % — ABNORMAL HIGH (ref 11.5–14.5)
WBC: 9.5 10*3/uL (ref 3.6–11.0)

## 2015-10-23 LAB — TROPONIN I: Troponin I: 0.03 ng/mL (ref <0.031)

## 2015-10-23 LAB — BRAIN NATRIURETIC PEPTIDE: B Natriuretic Peptide: 70 pg/mL (ref 0.0–100.0)

## 2015-10-23 MED ORDER — IPRATROPIUM-ALBUTEROL 18-103 MCG/ACT IN AERO: 2.0000 | INHALATION_SPRAY | Freq: Four times a day (QID) | RESPIRATORY_TRACT | Status: DC | PRN

## 2015-10-23 MED ORDER — METHYLPREDNISOLONE SODIUM SUCC 125 MG IJ SOLR
125.0000 mg | Freq: Once | INTRAMUSCULAR | Status: AC
  Administered 2015-10-23: 125 mg via INTRAVENOUS
  Filled 2015-10-23: qty 2

## 2015-10-23 MED ORDER — PREDNISONE 10 MG (21) PO TBPK: 10.0000 mg | ORAL_TABLET | Freq: Every day | ORAL | Status: DC

## 2015-10-23 NOTE — Discharge Instructions (Signed)
Bronchospasm, Adult A bronchospasm is a spasm or tightening of the airways going into the lungs. During a bronchospasm breathing becomes more difficult because the airways get smaller. When this happens there can be coughing, a whistling sound when breathing (wheezing), and difficulty breathing. Bronchospasm is often associated with asthma, but not all patients who experience a bronchospasm have asthma. CAUSES  A bronchospasm is caused by inflammation or irritation of the airways. The inflammation or irritation may be triggered by:   Allergies (such as to animals, pollen, food, or mold). Allergens that cause bronchospasm may cause wheezing immediately after exposure or many hours later.   Infection. Viral infections are believed to be the most common cause of bronchospasm.   Exercise.   Irritants (such as pollution, cigarette smoke, strong odors, aerosol sprays, and paint fumes).   Weather changes. Winds increase molds and pollens in the air. Rain refreshes the air by washing irritants out. Cold air may cause inflammation.   Stress and emotional upset.  SIGNS AND SYMPTOMS   Wheezing.   Excessive nighttime coughing.   Frequent or severe coughing with a simple cold.   Chest tightness.   Shortness of breath.  DIAGNOSIS  Bronchospasm is usually diagnosed through a history and physical exam. Tests, such as chest X-rays, are sometimes done to look for other conditions. TREATMENT   Inhaled medicines can be given to open up your airways and help you breathe. The medicines can be given using either an inhaler or a nebulizer machine.  Corticosteroid medicines may be given for severe bronchospasm, usually when it is associated with asthma. HOME CARE INSTRUCTIONS   Always have a plan prepared for seeking medical care. Know when to call your health care provider and local emergency services (911 in the [place] Know where you can access local emergency care.  Only take medicines as  directed by your health care provider.  If you were prescribed an inhaler or nebulizer machine, ask your health care provider to explain how to use it correctly. Always use a spacer with your inhaler if you were given one.  It is necessary to remain calm during an attack. Try to relax and breathe more slowly.  Control your home environment in the following ways:   Change your heating and air conditioning filter at least once a month.   Limit your use of fireplaces and wood stoves.  Do not smoke and do not allow smoking in your home.   Avoid exposure to perfumes and fragrances.   Get rid of pests (such as roaches and mice) and their droppings.   Throw away plants if you see mold on them.   Keep your house clean and dust free.   Replace carpet with wood, tile, or vinyl flooring. Carpet can trap dander and dust.   Use allergy-proof pillows, mattress covers, and box spring covers.   Wash bed sheets and blankets every week in hot water and dry them in a dryer.   Use blankets that are made of polyester or cotton.   Wash hands frequently. SEEK MEDICAL CARE IF:   You have muscle aches.   You have chest pain.   The sputum changes from clear or white to yellow, green, gray, or bloody.   The sputum you cough up gets thicker.   There are problems that may be related to the medicine you are given, such as a rash, itching, swelling, or trouble breathing.  SEEK IMMEDIATE MEDICAL CARE IF:   You have worsening wheezing and coughing  even after taking your prescribed medicines.   You have increased difficulty breathing.   You develop severe chest pain. MAKE SURE YOU:   Understand these instructions.  Will watch your condition.  Will get help right away if you are not doing well or get worse.   This information is not intended to replace advice given to you by your health care provider. Make sure you discuss any questions you have with your health care  provider.   Document Released: 09/11/2003 Document Revised: 09/29/2014 Document Reviewed: 02/28/2013 Elsevier Interactive Patient Education 2016 [employer].  Shortness of Breath Shortness of breath means you have trouble breathing. It could also mean that you have a medical problem. You should get immediate medical care for shortness of breath. CAUSES   Not enough oxygen in the air such as with high altitudes or a smoke-filled room.  Certain lung diseases, infections, or problems.  Heart disease or conditions, such as angina or heart failure.  Low red blood cells (anemia).  Poor physical fitness, which can cause shortness of breath when you exercise.  Chest or back injuries or stiffness.  Being overweight.  Smoking.  Anxiety, which can make you feel like you are not getting enough air. DIAGNOSIS  Serious medical problems can often be found during your physical exam. Tests may also be done to determine why you are having shortness of breath. Tests may include:  Chest X-rays.  Lung function tests.  Blood tests.  An electrocardiogram (ECG).  An ambulatory electrocardiogram. An ambulatory ECG records your heartbeat patterns over a 24-hour period.  Exercise testing.  A transthoracic echocardiogram (TTE). During echocardiography, sound waves are used to evaluate how blood flows through your heart.  A transesophageal echocardiogram (TEE).  Imaging scans. Your health care provider may not be able to find a cause for your shortness of breath after your exam. In this case, it is important to have a follow-up exam with your health care provider as directed.  TREATMENT  Treatment for shortness of breath depends on the cause of your symptoms and can vary greatly. HOME CARE INSTRUCTIONS   Do not smoke. Smoking is a common cause of shortness of breath. If you smoke, ask for help to quit.  Avoid being around chemicals or things that may bother your breathing, such as paint  fumes and dust.  Rest as needed. Slowly resume your usual activities.  If medicines were prescribed, take them as directed for the full length of time directed. This includes oxygen and any inhaled medicines.  Keep all follow-up appointments as directed by your health care provider. SEEK MEDICAL CARE IF:   Your condition does not improve in the time expected.  You have a hard time doing your normal activities even with rest.  You have any new symptoms. SEEK IMMEDIATE MEDICAL CARE IF:   Your shortness of breath gets worse.  You feel light-headed, faint, or develop a cough not controlled with medicines.  You start coughing up blood.  You have pain with breathing.  You have chest pain or pain in your arms, shoulders, or abdomen.  You have a fever.  You are unable to walk up stairs or exercise the way you normally do. MAKE SURE YOU:  Understand these instructions.  Will watch your condition.  Will get help right away if you are not doing well or get worse.   This information is not intended to replace advice given to you by your health care provider. Make sure you discuss any  questions you have with your health care provider.   Document Released: 06/03/2001 Document Revised: 09/13/2013 Document Reviewed: 11/24/2011 Elsevier Interactive Patient Education [employer]

## 2015-10-23 NOTE — ED Provider Notes (Signed)
[facility] Emergency Department Provider Note     Time seen: ----------------------------------------- 12:28 PM on 10/23/2015 -----------------------------------------    I have reviewed the triage vital signs and the nursing notes.   HISTORY  Chief Complaint Wheezing and Shortness of Breath    HPI Lisa [name] is a 82 y.o. female who presents ER with wheezing and shortness of breath that started this morning. Patient gave a treatment of Ventolin with relief of the wheezing in route. Patient was seen in the ER to be stable for same and was given nebs and antibiotics prescription. She denies fevers chills or other complaints at this time.   Past Medical History  Diagnosis Date  . Allergy   . Pelvis fracture (HCC)   . Wrist fracture, right   . Depression   . Spinal stenosis   . Osteoarthritis   . Hypertension   . Foot fracture     bilateral  . Brain bleed (HCC)   . V-tach (HCC)   . CHF (congestive heart failure) (HCC)   . Renal disorder     Patient Active Problem List   Diagnosis Date Noted  . Constipation 09/12/2015  . Insomnia due to anxiety and fear 09/12/2015  . Depression, controlled 09/12/2015  . Bacterial UTI 08/27/2015  . DVT, lower extremity (HCC) 08/27/2015  . TBI (traumatic brain injury) (HCC) 08/22/2015  . ICH (intracerebral hemorrhage) (HCC)   . Abscess   . Hypomagnesemia   . SDH (subdural hematoma) (HCC)   . Boil of upper extremity   . Pneumonia due to Haemophilus influenzae (HCC)   . Chronic systolic CHF (congestive heart failure) (HCC)   . Acute on chronic renal failure (HCC)   . Hypokalemia   . Bright red blood per rectum   . Dysphagia   . Pressure ulcer 08/16/2015  . Acute respiratory failure with hypoxemia (HCC)   . Hypernatremia   . Subdural hematoma (HCC) 08/03/2015  . Lumbar radiculopathy 06/06/2015  . Spinal stenosis, lumbar region, with neurogenic claudication 04/02/2015  . Degenerative lumbar disc  02/15/2015  . Lumbar post-laminectomy syndrome 02/15/2015  . Facet syndrome, lumbar 02/15/2015  . Sacroiliac joint dysfunction 02/15/2015    Past Surgical History  Procedure Laterality Date  . Back surgery N/A   . Breast lumpectomy N/A     Allergies Ambien; Daypro; Oxycodone; Shellfish allergy; Ace inhibitors; and Norvasc  Social History Social History  Substance Use Topics  . Smoking status: Never Smoker   . Smokeless tobacco: Never Used  . Alcohol Use: No    Review of Systems Constitutional: Negative for fever. Eyes: Negative for visual changes. ENT: Negative for sore throat. Cardiovascular: Negative for chest pain. Respiratory: Positive shortness of breath Gastrointestinal: Negative for abdominal pain, vomiting and diarrhea. Genitourinary: Negative for dysuria. Musculoskeletal: Negative for back pain. Skin: Negative for rash. Neurological: Negative for headaches, focal weakness or numbness.  10-point ROS otherwise negative.  ____________________________________________   PHYSICAL EXAM:  VITAL SIGNS: ED Triage Vitals  Enc Vitals Group     BP 10/23/15 1204 149/64 mmHg     Pulse Rate 10/23/15 1204 108     Resp 10/23/15 1204 20     Temp 10/23/15 1204 98.3 F (36.8 C)     Temp Source 10/23/15 1204 Oral     SpO2 10/23/15 1204 98 %     Weight 10/23/15 1204 144 lb (65.318 kg)     Height 10/23/15 1204  (1.6 m)     Head Cir --  Peak Flow --      Pain Score 10/23/15 1205 0     Pain Loc --      Pain Edu? --      Excl. in GC? --     Constitutional: Alert and oriented. Well appearing and in no distress. Eyes: Conjunctivae are normal. PERRL. Normal extraocular movements. ENT   Head: Normocephalic and atraumatic.   Nose: No congestion/rhinnorhea.   Mouth/Throat: Mucous membranes are moist.   Neck: No stridor. Cardiovascular: Normal rate, regular rhythm. Normal and symmetric distal pulses are present in all extremities. No murmurs, rubs, or  gallops. Respiratory: Normal respiratory effort without tachypnea nor retractions. Mild wheezing bilaterally. Gastrointestinal: Soft and nontender. No distention. No abdominal bruits.  Musculoskeletal: Nontender with normal range of motion in all extremities. No joint effusions.  No lower extremity tenderness nor edema. Neurologic:  Normal speech and language. No gross focal neurologic deficits are appreciated. Speech is normal. No gait instability. Skin:  Skin is warm, dry and intact. No rash noted. Psychiatric: Mood and affect are normal. Speech and behavior are normal. Patient exhibits appropriate insight and judgment. ____________________________________________  EKG: Interpreted by me. Sinus tachycardia with a rate of 109 bpm, normal PR interval, wide QRS, normal QT interval. Left Bundle branch block.  ____________________________________________  ED COURSE:  Pertinent labs & imaging results that were available during my care of the patient were reviewed by me and considered in my medical decision making (see chart for details). Patient is much better after breathing treatment in route. I will check basic labs and x-ray and reevaluate. ____________________________________________    LABS (pertinent positives/negatives)  Labs Reviewed  CBC WITH DIFFERENTIAL/PLATELET - Abnormal; Notable for the following:    RBC 3.74 (*)    Hemoglobin 11.5 (*)    HCT 34.7 (*)    RDW 15.6 (*)    All other components within normal limits  BASIC METABOLIC PANEL - Abnormal; Notable for the following:    Glucose, Bld 119 (*)    Creatinine, Ser 1.49 (*)    GFR calc non Af Amer 31 (*)    GFR calc Af Amer 35 (*)    All other components within normal limits  BRAIN NATRIURETIC PEPTIDE  TROPONIN I    RADIOLOGY Images were viewed by me  Chest x-ray IMPRESSION: No acute disease. ____________________________________________  FINAL ASSESSMENT AND PLAN  Dyspnea  Plan: Patient with labs and  imaging as dictated above. Patient had acute bronchospasm of uncertain etiology. Her labs and x-ray are normal. We'll change her inhaler to a Combivent inhaler as this seems to be the only thing it helps her when she has this condition. I will prescribe a short steroid taper.   Emily Filbert, MD   Emily Filbert, MD 10/23/15 [phone]

## 2015-10-23 NOTE — ED Notes (Signed)
Discussed discharge instructions, prescriptions, and follow-up care with patient. No questions or concerns at this time. Pt stable at discharge.  

## 2015-10-23 NOTE — ED Notes (Signed)
Pt to ED via EMS c/o wheezing and shortness of breath starting this morning. EMS gave x1 treatment ventolin with relief of wheezing. Pt states she was seen in ED 10/10/15 for same and given neb and abt prescription. Respirations even, unlabored at this time.

## 2015-11-08 ENCOUNTER — Encounter: Payer: Self-pay | Admitting: Cardiovascular Disease

## 2015-11-08 ENCOUNTER — Ambulatory Visit (INDEPENDENT_AMBULATORY_CARE_PROVIDER_SITE_OTHER): Payer: Medicare Other | Admitting: Cardiovascular Disease

## 2015-11-08 ENCOUNTER — Ambulatory Visit: Payer: Medicare Other | Admitting: Cardiovascular Disease

## 2015-11-08 VITALS — BP 130/72 | HR 84 | Ht 63.0 in | Wt 145.5 lb

## 2015-11-08 DIAGNOSIS — I5022 Chronic systolic (congestive) heart failure: Secondary | ICD-10-CM | POA: Diagnosis not present

## 2015-11-08 DIAGNOSIS — I472 Ventricular tachycardia, unspecified: Secondary | ICD-10-CM

## 2015-11-08 DIAGNOSIS — R0602 Shortness of breath: Secondary | ICD-10-CM | POA: Diagnosis not present

## 2015-11-08 NOTE — Patient Instructions (Signed)
Medication Instructions:  Your physician recommends that you continue on your current medications as directed. Please refer to the Current Medication list given to you today.   Labwork: none  Testing/Procedures: Your physician has requested that you have an echocardiogram. Echocardiography is a painless test that uses sound waves to create images of your heart. It provides your doctor with information about the size and shape of your heart and how well your heart's chambers and valves are working. This procedure takes approximately one hour. There are no restrictions for this procedure.    Follow-Up: Your physician recommends that you schedule a follow-up appointment in: one month with Dr. Arida.    Any Other Special Instructions Will Be Listed Below (If Applicable).     If you need a refill on your cardiac medications before your next appointment, please call your pharmacy.  Echocardiogram An echocardiogram, or echocardiography, uses sound waves (ultrasound) to produce an image of your heart. The echocardiogram is simple, painless, obtained within a short period of time, and offers valuable information to your health care provider. The images from an echocardiogram can provide information such as:  Evidence of coronary artery disease (CAD).  Heart size.  Heart muscle function.  Heart valve function.  Aneurysm detection.  Evidence of a past heart attack.  Fluid buildup around the heart.  Heart muscle thickening.  Assess heart valve function. LET YOUR HEALTH CARE PROVIDER KNOW ABOUT:  Any allergies you have.  All medicines you are taking, including vitamins, herbs, eye drops, creams, and over-the-counter medicines.  Previous problems you or members of your family have had with the use of anesthetics.  Any blood disorders you have.  Previous surgeries you have had.  Medical conditions you have.  Possibility of pregnancy, if this applies. BEFORE THE PROCEDURE   No special preparation is needed. Eat and drink normally.  PROCEDURE   In order to produce an image of your heart, gel will be applied to your chest and a wand-like tool (transducer) will be moved over your chest. The gel will help transmit the sound waves from the transducer. The sound waves will harmlessly bounce off your heart to allow the heart images to be captured in real-time motion. These images will then be recorded.  You may need an IV to receive a medicine that improves the quality of the pictures. AFTER THE PROCEDURE You may return to your normal schedule including diet, activities, and medicines, unless your health care provider tells you otherwise.   This information is not intended to replace advice given to you by your health care provider. Make sure you discuss any questions you have with your health care provider.   Document Released: 09/05/2000 Document Revised: 09/29/2014 Document Reviewed: 05/16/2013 Elsevier Interactive Patient Education 2016 [employer]  

## 2015-11-09 ENCOUNTER — Encounter: Payer: Self-pay | Admitting: Cardiovascular Disease

## 2015-11-09 DIAGNOSIS — I472 Ventricular tachycardia, unspecified: Secondary | ICD-10-CM | POA: Insufficient documentation

## 2015-11-09 NOTE — Assessment & Plan Note (Signed)
This was in the setting of intracranial hemorrhage and cardiomyopathy. She continues to be on small dose amiodarone. If ejection fraction is about 35%, we can probably discontinue amiodarone.

## 2015-11-09 NOTE — Assessment & Plan Note (Signed)
The patient had suspected stress-induced cardiomyopathy in the setting of intracranial hemorrhage. She appears to be stable at the present time. I think we need to determine if her LV systolic function recovered to normal or not. Thus, I requested an echocardiogram for evaluation. Currently there are no signs of fluid overload. She is on small dose furosemide 20 mg once daily. Continue treatment with metoprolol and irbesartan.

## 2015-11-09 NOTE — Progress Notes (Signed)
Primary care physician: Dr. Larwance Sachs  HPI  This is a pleasant 82 year old female who was referred for follow-up of congestive heart failure. She has no history of hypertension and GERD. She presented in November 2016 with subdural and subarachnoid hemorrhage after a fall. She suffered cardiac arrest at that time due to ventricular tachycardia. Troponin was mildly elevated. Echocardiogram showed an ejection fraction of 15-20% with akinesis of the entire anteroseptal myocardium with moderate pulmonary hypertension. She was suspected of having stress-induced cardiomyopathy and was managed conservatively. There was discussion about comfort care. However, surprisingly the patient slowly recovered. She was discharged to rehabilitation. She developed multiple complications including urinary tract infection and right leg DVT. She was treated with anticoagulation after clearance by neurosurgery. The patient made good recovery and ultimately she was discharged home. She has been doing very reasonably well and denies chest pain. Dyspnea is stable. No palpitations.  Allergies  Allergen Reactions  . Ambien [Zolpidem Tartrate] Other (See Comments)    Reaction:  Hallucinations   . Daypro [Oxaprozin] Other (See Comments)    GI upset  . Oxycodone Nausea And Vomiting  . Shellfish Allergy Swelling and Other (See Comments)    Pts mouth and face swells.   . Ace Inhibitors Cough  . Norvasc [Amlodipine Besylate] Palpitations     Current Outpatient Prescriptions on File Prior to Visit  Medication Sig Dispense Refill  . acetaminophen (TYLENOL) 325 MG tablet Take 1-2 tablets (325-650 mg total) by mouth every 4 (four) hours as needed for mild pain.    [name] albuterol (PROVENTIL HFA;VENTOLIN HFA) 108 (90 Base) MCG/ACT inhaler Inhale 2 puffs into the lungs every 4 (four) hours as needed for wheezing or shortness of breath (cough). 1 Inhaler 0  . amiodarone (PACERONE) 100 MG tablet Take 1 tablet (100 mg total) by mouth  daily. 30 tablet 1  . citalopram (CELEXA) 20 MG tablet Take 1 tablet (20 mg total) by mouth daily. 30 tablet 1  . feeding supplement, ENSURE ENLIVE, (ENSURE ENLIVE) LIQD Take 237 mLs by mouth 3 (three) times daily between meals. 237 mL 12  . HYDROcodone-acetaminophen (NORCO/VICODIN) 5-325 MG tablet Take 1 tablet by mouth every 6 (six) hours as needed for moderate pain. 15 tablet 0  . irbesartan (AVAPRO) 300 MG tablet Take 150 mg by mouth daily.  3  . metoprolol tartrate (LOPRESSOR) 25 MG tablet Take 0.5 tablets (12.5 mg total) by mouth 2 (two) times daily with a meal. 60 tablet 0  . mirtazapine (REMERON) 15 MG tablet Take 1 tablet by mouth daily.  0  . omeprazole (PRILOSEC) 20 MG capsule Take 1 capsule by mouth daily.  10  . pantoprazole (PROTONIX) 20 MG tablet Take 1 tablet (20 mg total) by mouth 2 (two) times daily.    . potassium chloride SA (K-DUR,KLOR-CON) 20 MEQ tablet Take 1 tablet (20 mEq total) by mouth daily.    . predniSONE (STERAPRED UNI-PAK 21 TAB) 10 MG (21) TBPK tablet Take 1 tablet (10 mg total) by mouth daily. Take steroid taper pak as directed 21 tablet 0  . QUEtiapine (SEROQUEL) 50 MG tablet Take 1 tablet (50 mg total) by mouth at bedtime. 7 tablet 0  . raloxifene (EVISTA) 60 MG tablet Take 1 tablet by mouth daily.    [name] senna-docusate (SENOKOT-S) 8.6-50 MG tablet Take 2 tablets by mouth 2 (two) times daily.    [name] ELIQUIS 5 MG TABS tablet Take 1 tablet by mouth 2 (two) times daily. Reported on 11/08/2015  0  No current facility-administered medications on file prior to visit.     Past Medical History  Diagnosis Date  . Allergy   . Pelvis fracture (HCC)   . Wrist fracture, right   . Depression   . Spinal stenosis   . Osteoarthritis   . Hypertension   . Foot fracture     bilateral  . Brain bleed (HCC)   . V-tach (HCC)   . CHF (congestive heart failure) (HCC)   . Renal disorder      Past Surgical History  Procedure Laterality Date  . Back surgery N/A   . Breast  lumpectomy N/A      Family History  Problem Relation Age of Onset  . COPD Mother   . COPD Father   . Heart disease Father      Social History   Social History  . Marital Status: Widowed    Spouse Name: N/A  . Number of Children: N/A  . Years of Education: N/A   Occupational History  . Not on file.   Social History Main Topics  . Smoking status: Never Smoker   . Smokeless tobacco: Never Used  . Alcohol Use: No  . Drug Use: No  . Sexual Activity: Not on file   Other Topics Concern  . Not on file   Social History Narrative      PHYSICAL EXAM   BP 130/72 mmHg  Pulse 84  Ht  (1.6 m)  Wt 145 lb 8 oz (65.998 kg)  BMI 25.78 kg/m2 Constitutional: She is oriented to person, place, and time. She appears well-developed and well-nourished. No distress.  HENT: No nasal discharge.  Head: Normocephalic and atraumatic.  Eyes: Pupils are equal and round. No discharge.  Neck: Normal range of motion. Neck supple. No JVD present. No thyromegaly present.  Cardiovascular: Normal rate, regular rhythm, normal heart sounds. Exam reveals no gallop and no friction rub. No murmur heard.  Pulmonary/Chest: Effort normal and breath sounds normal. No stridor. No respiratory distress. She has no wheezes. She has no rales. She exhibits no tenderness.  Abdominal: Soft. Bowel sounds are normal. She exhibits no distension. There is no tenderness. There is no rebound and no guarding.  Musculoskeletal: Normal range of motion. She exhibits no edema and no tenderness.  Neurological: She is alert and oriented to person, place, and time. Coordination normal.  Skin: Skin is warm and dry. No rash noted. She is not diaphoretic. No erythema. No pallor.  Psychiatric: She has a normal mood and affect. Her behavior is normal. Judgment and thought content normal.    Normal sinus rhythm with left bundle branch block.  EKG:   ASSESSMENT AND PLAN

## 2015-11-16 ENCOUNTER — Ambulatory Visit (INDEPENDENT_AMBULATORY_CARE_PROVIDER_SITE_OTHER): Payer: Medicare Other

## 2015-11-16 ENCOUNTER — Other Ambulatory Visit: Payer: Self-pay

## 2015-11-16 DIAGNOSIS — R0602 Shortness of breath: Secondary | ICD-10-CM | POA: Diagnosis not present

## 2015-11-19 ENCOUNTER — Other Ambulatory Visit: Payer: Self-pay

## 2015-11-21 ENCOUNTER — Inpatient Hospital Stay
Admission: EM | Admit: 2015-11-21 | Discharge: 2015-11-21 | DRG: 194 | Disposition: A | Discharge status: Home / Self Care | Payer: Medicare Other | Attending: Internal Medicine | Admitting: Internal Medicine

## 2015-11-21 ENCOUNTER — Emergency Department: Payer: Medicare Other

## 2015-11-21 DIAGNOSIS — J101 Influenza due to other identified influenza virus with other respiratory manifestations: Principal | ICD-10-CM | POA: Diagnosis present

## 2015-11-21 DIAGNOSIS — Z885 Allergy status to narcotic agent status: Secondary | ICD-10-CM | POA: Diagnosis not present

## 2015-11-21 DIAGNOSIS — Z7901 Long term (current) use of anticoagulants: Secondary | ICD-10-CM

## 2015-11-21 DIAGNOSIS — Z79891 Long term (current) use of opiate analgesic: Secondary | ICD-10-CM

## 2015-11-21 DIAGNOSIS — F329 Major depressive disorder, single episode, unspecified: Secondary | ICD-10-CM | POA: Diagnosis present

## 2015-11-21 DIAGNOSIS — Z79899 Other long term (current) drug therapy: Secondary | ICD-10-CM | POA: Diagnosis not present

## 2015-11-21 DIAGNOSIS — R059 Cough, unspecified: Secondary | ICD-10-CM

## 2015-11-21 DIAGNOSIS — M8580 Other specified disorders of bone density and structure, unspecified site: Secondary | ICD-10-CM | POA: Diagnosis present

## 2015-11-21 DIAGNOSIS — M1990 Unspecified osteoarthritis, unspecified site: Secondary | ICD-10-CM | POA: Diagnosis present

## 2015-11-21 DIAGNOSIS — Z8679 Personal history of other diseases of the circulatory system: Secondary | ICD-10-CM

## 2015-11-21 DIAGNOSIS — N184 Chronic kidney disease, stage 4 (severe): Secondary | ICD-10-CM | POA: Diagnosis present

## 2015-11-21 DIAGNOSIS — Z7952 Long term (current) use of systemic steroids: Secondary | ICD-10-CM | POA: Diagnosis not present

## 2015-11-21 DIAGNOSIS — I129 Hypertensive chronic kidney disease with stage 1 through stage 4 chronic kidney disease, or unspecified chronic kidney disease: Secondary | ICD-10-CM | POA: Diagnosis present

## 2015-11-21 DIAGNOSIS — M4800 Spinal stenosis, site unspecified: Secondary | ICD-10-CM | POA: Diagnosis present

## 2015-11-21 DIAGNOSIS — I5022 Chronic systolic (congestive) heart failure: Secondary | ICD-10-CM | POA: Diagnosis present

## 2015-11-21 DIAGNOSIS — Z91013 Allergy to seafood: Secondary | ICD-10-CM

## 2015-11-21 DIAGNOSIS — Z888 Allergy status to other drugs, medicaments and biological substances status: Secondary | ICD-10-CM

## 2015-11-21 DIAGNOSIS — R05 Cough: Secondary | ICD-10-CM | POA: Diagnosis present

## 2015-11-21 DIAGNOSIS — R509 Fever, unspecified: Secondary | ICD-10-CM

## 2015-11-21 LAB — CBC WITH DIFFERENTIAL/PLATELET
BASOS ABS: 0.1 10*3/uL (ref 0–0.1)
Basophils Relative: 3 %
Eosinophils Absolute: 0.1 10*3/uL (ref 0–0.7)
Eosinophils Relative: 2 %
HEMATOCRIT: 31 % — AB (ref 35.0–47.0)
HEMOGLOBIN: 10.3 g/dL — AB (ref 12.0–16.0)
LYMPHS PCT: 13 %
Lymphs Abs: 0.6 10*3/uL — ABNORMAL LOW (ref 1.0–3.6)
MCH: 30.5 pg (ref 26.0–34.0)
MCHC: 33.2 g/dL (ref 32.0–36.0)
MCV: 92 fL (ref 80.0–100.0)
Monocytes Absolute: 0.5 10*3/uL (ref 0.2–0.9)
Monocytes Relative: 12 %
NEUTROS ABS: 3.1 10*3/uL (ref 1.4–6.5)
NEUTROS PCT: 70 %
PLATELETS: 213 10*3/uL (ref 150–440)
RBC: 3.37 MIL/uL — AB (ref 3.80–5.20)
RDW: 15.2 % — ABNORMAL HIGH (ref 11.5–14.5)
WBC: 4.4 10*3/uL (ref 3.6–11.0)

## 2015-11-21 LAB — COMPREHENSIVE METABOLIC PANEL
ALT: 15 U/L (ref 14–54)
ANION GAP: 8 (ref 5–15)
AST: 40 U/L (ref 15–41)
Albumin: 3.1 g/dL — ABNORMAL LOW (ref 3.5–5.0)
Alkaline Phosphatase: 47 U/L (ref 38–126)
BUN: 14 mg/dL (ref 6–20)
CHLORIDE: 111 mmol/L (ref 101–111)
CO2: 20 mmol/L — ABNORMAL LOW (ref 22–32)
Calcium: 8.2 mg/dL — ABNORMAL LOW (ref 8.9–10.3)
Creatinine, Ser: 1.56 mg/dL — ABNORMAL HIGH (ref 0.44–1.00)
GFR calc Af Amer: 34 mL/min — ABNORMAL LOW (ref >60)
GFR, EST NON AFRICAN AMERICAN: 29 mL/min — AB (ref >60)
Glucose, Bld: 91 mg/dL (ref 65–99)
POTASSIUM: 3.5 mmol/L (ref 3.5–5.1)
Sodium: 139 mmol/L (ref 135–145)
Total Bilirubin: 0.3 mg/dL (ref 0.3–1.2)
Total Protein: 6.4 g/dL — ABNORMAL LOW (ref 6.5–8.1)

## 2015-11-21 LAB — TSH: TSH: 8.995 u[IU]/mL — ABNORMAL HIGH (ref 0.350–4.500)

## 2015-11-21 LAB — TROPONIN I: TROPONIN I: 0.03 ng/mL (ref <0.031)

## 2015-11-21 LAB — LACTIC ACID, PLASMA
Lactic Acid, Venous: 1.2 mmol/L (ref 0.5–2.0)
Lactic Acid, Venous: 1.3 mmol/L (ref 0.5–2.0)

## 2015-11-21 LAB — RAPID INFLUENZA A&B ANTIGENS (ARMC ONLY)
INFLUENZA A (ARMC): DETECTED — AB
INFLUENZA B (ARMC): NOT DETECTED — AB

## 2015-11-21 MED ORDER — ENSURE ENLIVE PO LIQD: 237.0000 mL | Freq: Three times a day (TID) | ORAL | Status: DC

## 2015-11-21 MED ORDER — SODIUM CHLORIDE 0.9 % IV SOLN
INTRAVENOUS | Status: DC
  Administered 2015-11-21: 06:00:00 via INTRAVENOUS

## 2015-11-21 MED ORDER — ALBUTEROL SULFATE (2.5 MG/3ML) 0.083% IN NEBU: 3.0000 mL | INHALATION_SOLUTION | RESPIRATORY_TRACT | Status: DC | PRN

## 2015-11-21 MED ORDER — METHYLPREDNISOLONE SODIUM SUCC 125 MG IJ SOLR
125.0000 mg | Freq: Once | INTRAMUSCULAR | Status: AC
  Administered 2015-11-21: 125 mg via INTRAVENOUS
  Filled 2015-11-21: qty 2

## 2015-11-21 MED ORDER — SODIUM CHLORIDE 0.9% FLUSH: 3.0000 mL | Freq: Two times a day (BID) | INTRAVENOUS | Status: DC

## 2015-11-21 MED ORDER — ONDANSETRON HCL 4 MG/2ML IJ SOLN: 4.0000 mg | Freq: Four times a day (QID) | INTRAMUSCULAR | Status: DC | PRN

## 2015-11-21 MED ORDER — ACETAMINOPHEN 500 MG PO TABS
ORAL_TABLET | ORAL | Status: AC
Start: 2015-11-21 — End: 2015-11-21
  Administered 2015-11-21: 1000 mg via ORAL
  Filled 2015-11-21: qty 2

## 2015-11-21 MED ORDER — QUETIAPINE FUMARATE 25 MG PO TABS: 50.0000 mg | ORAL_TABLET | Freq: Every day | ORAL | Status: DC

## 2015-11-21 MED ORDER — SENNOSIDES-DOCUSATE SODIUM 8.6-50 MG PO TABS
2.0000 | ORAL_TABLET | Freq: Two times a day (BID) | ORAL | Status: DC
  Administered 2015-11-21: 2 via ORAL
  Filled 2015-11-21: qty 2

## 2015-11-21 MED ORDER — ACETAMINOPHEN 325 MG PO TABS: 650.0000 mg | ORAL_TABLET | Freq: Four times a day (QID) | ORAL | Status: DC | PRN

## 2015-11-21 MED ORDER — SODIUM CHLORIDE 0.9 % IV BOLUS (SEPSIS)
1000.0000 mL | Freq: Once | INTRAVENOUS | Status: AC
  Administered 2015-11-21: 1000 mL via INTRAVENOUS

## 2015-11-21 MED ORDER — APIXABAN 5 MG PO TABS
5.0000 mg | ORAL_TABLET | Freq: Two times a day (BID) | ORAL | Status: DC
  Filled 2015-11-21: qty 1

## 2015-11-21 MED ORDER — OSELTAMIVIR PHOSPHATE 30 MG PO CAPS: 30.0000 mg | ORAL_CAPSULE | Freq: Every day | ORAL | Status: DC

## 2015-11-21 MED ORDER — IRBESARTAN 150 MG PO TABS
150.0000 mg | ORAL_TABLET | Freq: Every day | ORAL | Status: DC
  Administered 2015-11-21: 150 mg via ORAL
  Filled 2015-11-21: qty 1

## 2015-11-21 MED ORDER — CITALOPRAM HYDROBROMIDE 20 MG PO TABS
20.0000 mg | ORAL_TABLET | Freq: Every day | ORAL | Status: DC
  Administered 2015-11-21: 20 mg via ORAL
  Filled 2015-11-21: qty 1

## 2015-11-21 MED ORDER — OSELTAMIVIR PHOSPHATE 30 MG PO CAPS
30.0000 mg | ORAL_CAPSULE | Freq: Every day | ORAL | Status: DC
  Filled 2015-11-21: qty 1

## 2015-11-21 MED ORDER — OSELTAMIVIR PHOSPHATE 30 MG PO CAPS
30.0000 mg | ORAL_CAPSULE | Freq: Once | ORAL | Status: AC
  Administered 2015-11-21: 30 mg via ORAL
  Filled 2015-11-21: qty 1

## 2015-11-21 MED ORDER — POTASSIUM CHLORIDE CRYS ER 20 MEQ PO TBCR
20.0000 meq | EXTENDED_RELEASE_TABLET | Freq: Every day | ORAL | Status: DC
  Administered 2015-11-21: 20 meq via ORAL
  Filled 2015-11-21: qty 1

## 2015-11-21 MED ORDER — ACETAMINOPHEN 650 MG RE SUPP: 650.0000 mg | Freq: Four times a day (QID) | RECTAL | Status: DC | PRN

## 2015-11-21 MED ORDER — RALOXIFENE HCL 60 MG PO TABS
60.0000 mg | ORAL_TABLET | Freq: Every day | ORAL | Status: DC
  Administered 2015-11-21: 60 mg via ORAL
  Filled 2015-11-21: qty 1

## 2015-11-21 MED ORDER — PANTOPRAZOLE SODIUM 40 MG PO TBEC
40.0000 mg | DELAYED_RELEASE_TABLET | Freq: Every day | ORAL | Status: DC
  Administered 2015-11-21: 40 mg via ORAL
  Filled 2015-11-21: qty 1

## 2015-11-21 MED ORDER — METOPROLOL TARTRATE 25 MG PO TABS
12.5000 mg | ORAL_TABLET | Freq: Two times a day (BID) | ORAL | Status: DC
  Administered 2015-11-21: 12.5 mg via ORAL
  Filled 2015-11-21: qty 1

## 2015-11-21 MED ORDER — ONDANSETRON HCL 4 MG PO TABS: 4.0000 mg | ORAL_TABLET | Freq: Four times a day (QID) | ORAL | Status: DC | PRN

## 2015-11-21 MED ORDER — ACETAMINOPHEN 500 MG PO TABS
1000.0000 mg | ORAL_TABLET | Freq: Once | ORAL | Status: AC
  Administered 2015-11-21: 1000 mg via ORAL

## 2015-11-21 MED ORDER — CALCIUM CARBONATE ANTACID 500 MG PO CHEW
1.0000 | CHEWABLE_TABLET | Freq: Two times a day (BID) | ORAL | Status: DC
  Filled 2015-11-21: qty 1

## 2015-11-21 MED ORDER — GUAIFENESIN-DM 100-10 MG/5ML PO SYRP: 5.0000 mL | ORAL_SOLUTION | ORAL | Status: DC | PRN

## 2015-11-21 MED ORDER — MIRTAZAPINE 15 MG PO TABS
15.0000 mg | ORAL_TABLET | Freq: Every day | ORAL | Status: DC
  Administered 2015-11-21: 15 mg via ORAL
  Filled 2015-11-21: qty 1

## 2015-11-21 MED ORDER — IPRATROPIUM-ALBUTEROL 0.5-2.5 (3) MG/3ML IN SOLN
3.0000 mL | Freq: Once | RESPIRATORY_TRACT | Status: AC
  Administered 2015-11-21: 3 mL via RESPIRATORY_TRACT
  Filled 2015-11-21: qty 3

## 2015-11-21 NOTE — Care Management Obs Status (Signed)
MEDICARE OBSERVATION STATUS NOTIFICATION   Patient Details  Name: Lisa [name] MRN: [id number] Date of Birth: 1927/02/10   Medicare Observation Status Notification Given:  Yes    Marily Memos, RN 11/21/2015, 2:02 PM

## 2015-11-21 NOTE — Care Management Note (Signed)
Case Management Note  Patient Details  Name: RENNAE FERRAIOLO MRN: [id number] Date of Birth: September 30, 1926  Subjective/Objective:   Case discussed with Dr. Clint Guy. Assessment for discharge planning. Spoke with patient. She lives at home alone and has 24 hour caregivers. She has a cane, wheelchair and walker. She has prescription drug coverage under her BC[employer]. Denies issues obtaining medical care or transportation.                  Action/Plan: No needs identified. Will sign off  Expected Discharge Date:                  Expected Discharge Plan:  Home/Self Care  In-House Referral:     Discharge planning Services  CM Consult  Post Acute Care Choice:    Choice offered to:     DME Arranged:    DME Agency:     HH Arranged:    HH Agency:     Status of Service:  Completed, signed off  Medicare Important Message Given:    Date Medicare IM Given:    Medicare IM give by:    Date Additional Medicare IM Given:    Additional Medicare Important Message give by:     If discussed at Long Length of Stay Meetings, dates discussed:    Additional Comments:  Marily Memos, RN 11/21/2015, 8:58 AM

## 2015-11-21 NOTE — H&P (Signed)
Lisa [name] is an 82 y.o. female.   Chief Complaint: Body aches HPI: The patient presents emergency department complaining of fever and body aches. She also experienced episode of nausea and vomiting as well as diarrhea tonight. She has had a cough for the better part of a month. She was diagnosed with bronchitis earlier this month and she has been exposed to caregivers that may have had the flu. Also, the patient has had a number of falls. She has not injured her head in any of them but admits that she has felt progressively weaker over the course of a few weeks. In the emergency department she was found to be febrile and positive for influenza A. Due to her generalized weakness, advanced age and viral illness the emergency department staff called for admission.  Past Medical History  Diagnosis Date  . Allergy   . Pelvis fracture ([place])   . Wrist fracture, right   . Depression   . Spinal stenosis   . Osteoarthritis   . Hypertension   . Foot fracture     bilateral  . Brain bleed ([place])   . V-tach ([place])   . CHF (congestive heart failure) ([place])   . Renal disorder     Past Surgical History  Procedure Laterality Date  . Back surgery N/A   . Breast lumpectomy N/A     Family History  Problem Relation Age of Onset  . COPD Mother   . COPD Father   . Heart disease Father    Social History:  reports that she has never smoked. She has never used smokeless tobacco. She reports that she does not drink alcohol or use illicit drugs.  Allergies:  Allergies  Allergen Reactions  . Ambien [Zolpidem Tartrate] Other (See Comments)    Reaction:  Hallucinations   . Daypro [Oxaprozin] Other (See Comments)    GI upset  . Oxycodone Nausea And Vomiting  . Shellfish Allergy Swelling and Other (See Comments)    Pts mouth and face swells.   . Ace Inhibitors Cough  . Norvasc [Amlodipine Besylate] Palpitations    Medications Prior to Admission  Medication Sig Dispense Refill  . acetaminophen  (TYLENOL) 325 MG tablet Take 1-2 tablets (325-650 mg total) by mouth every 4 (four) hours as needed for mild pain.    [name] albuterol (PROVENTIL HFA;VENTOLIN HFA) 108 (90 Base) MCG/ACT inhaler Inhale 2 puffs into the lungs every 4 (four) hours as needed for wheezing or shortness of breath (cough). 1 Inhaler 0  . citalopram (CELEXA) 20 MG tablet Take 1 tablet (20 mg total) by mouth daily. 30 tablet 1  . ELIQUIS 5 MG TABS tablet Take 1 tablet by mouth 2 (two) times daily. Reported on 11/08/2015  0  . feeding supplement, ENSURE ENLIVE, (ENSURE ENLIVE) LIQD Take 237 mLs by mouth 3 (three) times daily between meals. 237 mL 12  . HYDROcodone-acetaminophen (NORCO/VICODIN) 5-325 MG tablet Take 1 tablet by mouth every 6 (six) hours as needed for moderate pain. 15 tablet 0  . irbesartan (AVAPRO) 300 MG tablet Take 150 mg by mouth daily.  3  . metoprolol tartrate (LOPRESSOR) 25 MG tablet Take 0.5 tablets (12.5 mg total) by mouth 2 (two) times daily with a meal. 60 tablet 0  . mirtazapine (REMERON) 15 MG tablet Take 1 tablet by mouth daily.  0  . omeprazole (PRILOSEC) 20 MG capsule Take 1 capsule by mouth daily.  10  . pantoprazole (PROTONIX) 20 MG tablet Take 1 tablet (20 mg  total) by mouth 2 (two) times daily.    . potassium chloride SA (K-DUR,KLOR-CON) 20 MEQ tablet Take 1 tablet (20 mEq total) by mouth daily.    . QUEtiapine (SEROQUEL) 50 MG tablet Take 1 tablet (50 mg total) by mouth at bedtime. 7 tablet 0  . raloxifene (EVISTA) 60 MG tablet Take 1 tablet by mouth daily.    [name] senna-docusate (SENOKOT-S) 8.6-50 MG tablet Take 2 tablets by mouth 2 (two) times daily.    . predniSONE (STERAPRED UNI-PAK 21 TAB) 10 MG (21) TBPK tablet Take 1 tablet (10 mg total) by mouth daily. Take steroid taper pak as directed (Patient not taking: Reported on 11/21/2015) 21 tablet 0    Results for orders placed or performed during the hospital encounter of 11/21/15 (from the past 48 hour(s))  Lactic acid, plasma     Status: None    Collection Time: 11/21/15 12:59 AM  Result Value Ref Range   Lactic Acid, Venous 1.2 0.5 - 2.0 mmol/L  Rapid Influenza A&B Antigens ([facility] only)     Status: Abnormal   Collection Time: 11/21/15  1:00 AM  Result Value Ref Range   Influenza A ([facility]) DETECTED (A) NEGATIVE   Influenza B ([facility]) NOT DETECTED (A) NEGATIVE  CBC with Differential     Status: Abnormal   Collection Time: 11/21/15  1:04 AM  Result Value Ref Range   WBC 4.4 3.6 - 11.0 K/uL   RBC 3.37 (L) 3.80 - 5.20 MIL/uL   Hemoglobin 10.3 (L) 12.0 - 16.0 g/dL   HCT 31.0 (L) 35.0 - 47.0 %   MCV 92.0 80.0 - 100.0 fL   MCH 30.5 26.0 - 34.0 pg   MCHC 33.2 32.0 - 36.0 g/dL   RDW 15.2 (H) 11.5 - 14.5 %   Platelets 213 150 - 440 K/uL   Neutrophils Relative % 70 %   Neutro Abs 3.1 1.4 - 6.5 K/uL   Lymphocytes Relative 13 %   Lymphs Abs 0.6 (L) 1.0 - 3.6 K/uL   Monocytes Relative 12 %   Monocytes Absolute 0.5 0.2 - 0.9 K/uL   Eosinophils Relative 2 %   Eosinophils Absolute 0.1 0 - 0.7 K/uL   Basophils Relative 3 %   Basophils Absolute 0.1 0 - 0.1 K/uL  Comprehensive metabolic panel     Status: Abnormal   Collection Time: 11/21/15  1:04 AM  Result Value Ref Range   Sodium 139 135 - 145 mmol/L   Potassium 3.5 3.5 - 5.1 mmol/L   Chloride 111 101 - 111 mmol/L   CO2 20 (L) 22 - 32 mmol/L   Glucose, Bld 91 65 - 99 mg/dL   BUN 14 6 - 20 mg/dL   Creatinine, Ser 1.56 (H) 0.44 - 1.00 mg/dL   Calcium 8.2 (L) 8.9 - 10.3 mg/dL   Total Protein 6.4 (L) 6.5 - 8.1 g/dL   Albumin 3.1 (L) 3.5 - 5.0 g/dL   AST 40 15 - 41 U/L   ALT 15 14 - 54 U/L   Alkaline Phosphatase 47 38 - 126 U/L   Total Bilirubin 0.3 0.3 - 1.2 mg/dL   GFR calc non Af Amer 29 (L) >60 mL/min   GFR calc Af Amer 34 (L) >60 mL/min    Comment: (NOTE) The eGFR has been calculated using the CKD EPI equation. This calculation has not been validated in all clinical situations. eGFR's persistently <60 mL/min signify possible Chronic Kidney Disease.    Anion gap 8 5 - 15  Troponin I     Status: None   Collection Time: 11/21/15  1:04 AM  Result Value Ref Range   Troponin I 0.03 <0.031 ng/mL    Comment:        NO INDICATION OF MYOCARDIAL INJURY.    Dg Chest Port 1 View  11/21/2015  CLINICAL DATA:  82 year old female with cough and fever EXAM: PORTABLE CHEST 1 VIEW COMPARISON:  Radiograph dated 10/23/2015 FINDINGS: The heart size and mediastinal contours are within normal limits. Both lungs are clear. The visualized skeletal structures are unremarkable. IMPRESSION: No active disease. Electronically Signed   By: Anner Crete M.D.   On: 11/21/2015 02:06    Review of Systems  Constitutional: Positive for fever and malaise/fatigue. Negative for chills.  HENT: Negative for sore throat and tinnitus.   Eyes: Negative for blurred vision and redness.  Respiratory: Negative for cough and shortness of breath.   Cardiovascular: Negative for chest pain, palpitations, orthopnea and PND.  Gastrointestinal: Positive for nausea, vomiting and diarrhea. Negative for abdominal pain.  Genitourinary: Negative for dysuria, urgency and frequency.  Musculoskeletal: Positive for falls. Negative for myalgias and joint pain.  Skin: Negative for rash.       No lesions  Neurological: Negative for speech change and focal weakness.  Endo/Heme/Allergies: Does not bruise/bleed easily.       No temperature intolerance  Psychiatric/Behavioral: Negative for depression and suicidal ideas.    Blood pressure 138/46, pulse 81, temperature 97.7 F (36.5 C), temperature source Oral, resp. rate 20, height _0  (1.6 m), weight 66.044 kg (145 lb 9.6 oz), SpO2 96 %. Physical Exam  Vitals reviewed. Constitutional: She is oriented to person, place, and time. She appears well-developed and well-nourished. No distress.  HENT:  Head: Normocephalic and atraumatic.  Mouth/Throat: Oropharynx is clear and moist.  Eyes: Conjunctivae and EOM are normal. Pupils are equal, round, and reactive to light.   Neck: Normal range of motion. Neck supple. No JVD present. No tracheal deviation present. No thyromegaly present.  Cardiovascular: Normal rate, regular rhythm and normal heart sounds.  Exam reveals no gallop and no friction rub.   No murmur heard. Respiratory: Effort normal and breath sounds normal.  GI: Soft. Bowel sounds are normal. She exhibits no distension. There is no tenderness.  Genitourinary:  Deferred  Lymphadenopathy:    She has no cervical adenopathy.  Neurological: She is alert and oriented to person, place, and time. No cranial nerve deficit. She exhibits normal muscle tone.  Skin: Skin is warm and dry. No rash noted. No erythema.  Psychiatric: She has a normal mood and affect. Her behavior is normal. Judgment and thought content normal.     Assessment/Plan This is an 83 year old female with influenza as well as generalized weakness resulting in falls. 1. Influenza: Type A; the patient has received influenza vaccine. She is no longer febrile after Tylenol. We've placed the patient on Tamiflu dosed for her renal function and we will hydrate her with intravenous fluid. Coverage by mouth intake. Anti-emetics as needed. No mental status changes, respiratory distress or evidence of pneumonia. 2. CKD: Stage IV. Hydrate with intravenous fluid and avoid nephrotoxic agents 3. CHF: Systolic; chronic. Monitor fluid balance. Lasix as needed. Continue intravenous fluids once patient is taking oral intake consistently. 4. Essential hypertension: Continue metoprolol and Avapro 5. Osteopenia: Continue raloxifene 6. Depression: Continue Celexa and Remeron 7. DVT prophylaxis: Eliquis (history of DVT although the patient also has a history of subdural hematoma; clarify if this medication is  appropriate for this patient) 8. GI prophylaxis: Pantoprazole per home regimen The patient is a full code. Time spent on admission orders and patient care approximately 45 minutes  Harrie Foreman,  MD 11/21/2015, 6:54 AM

## 2015-11-21 NOTE — ED Provider Notes (Signed)
[facility] Emergency Department Provider Note  ____________________________________________  Time seen: Approximately 12:50 AM  I have reviewed the triage vital signs and the nursing notes.   HISTORY  Chief Complaint Generalized Body Aches and Nausea    HPI Lisa [name] is a 82 y.o. female who presents to the ED from home via EMS with a chief complaint of flulike symptoms. Patient reports a five-day history of low-grade fever, chills, vomiting, diarrhea, generalized body aches, nonproductive cough. Sent to the ED tonight by her caregiver for further evaluation for acutely high fever tonight with myalgias, vomiting and diarrhea. + Sick contacts. Denies associated headache, neck pain, vision changes, chest pain, shortness of breath, abdominal pain, dysuria. Denies recent travel or trauma. Nothing makes her symptoms worse. Antipyretics make her symptoms better.   Past Medical History  Diagnosis Date  . Allergy   . Pelvis fracture ([facility])   . Wrist fracture, right   . Depression   . Spinal stenosis   . Osteoarthritis   . Hypertension   . Foot fracture     bilateral  . Brain bleed ([facility])   . V-tach ([facility])   . CHF (congestive heart failure) ([facility])   . Renal disorder     Patient Active Problem List   Diagnosis Date Noted  . Ventricular tachycardia ([facility]) 11/09/2015  . Constipation 09/12/2015  . Insomnia due to anxiety and fear 09/12/2015  . Depression, controlled 09/12/2015  . Bacterial UTI 08/27/2015  . DVT, lower extremity ([facility]) 08/27/2015  . TBI (traumatic brain injury) ([facility]) 08/22/2015  . ICH (intracerebral hemorrhage) ([facility])   . Abscess   . Hypomagnesemia   . SDH (subdural hematoma) ([facility])   . Boil of upper extremity   . Pneumonia due to Haemophilus influenzae ([facility])   . Chronic systolic CHF (congestive heart failure) ([facility])   . Acute on chronic renal failure ([facility])   . Hypokalemia   . Bright red blood per rectum   . Dysphagia   . Pressure ulcer  08/16/2015  . Acute respiratory failure with hypoxemia ([facility])   . Hypernatremia   . Subdural hematoma ([facility]) 08/03/2015  . Lumbar radiculopathy 06/06/2015  . Spinal stenosis, lumbar region, with neurogenic claudication 04/02/2015  . Degenerative lumbar disc 02/15/2015  . Lumbar post-laminectomy syndrome 02/15/2015  . Facet syndrome, lumbar 02/15/2015  . Sacroiliac joint dysfunction 02/15/2015    Past Surgical History  Procedure Laterality Date  . Back surgery N/A   . Breast lumpectomy N/A     Current Outpatient Rx  Name  Route  Sig  Dispense  Refill  . acetaminophen (TYLENOL) 325 MG tablet   Oral   Take 1-2 tablets (325-650 mg total) by mouth every 4 (four) hours as needed for mild pain.         [name] albuterol (PROVENTIL HFA;VENTOLIN HFA) 108 (90 Base) MCG/ACT inhaler   Inhalation   Inhale 2 puffs into the lungs every 4 (four) hours as needed for wheezing or shortness of breath (cough).   1 Inhaler   0   . citalopram (CELEXA) 20 MG tablet   Oral   Take 1 tablet (20 mg total) by mouth daily.   30 tablet   1   . ELIQUIS 5 MG TABS tablet   Oral   Take 1 tablet by mouth 2 (two) times daily. Reported on 11/08/2015      0     Dispense as written.   . feeding supplement, ENSURE ENLIVE, (ENSURE ENLIVE) LIQD   Oral  Take 237 mLs by mouth 3 (three) times daily between meals.   237 mL   12   . HYDROcodone-acetaminophen (NORCO/VICODIN) 5-325 MG tablet   Oral   Take 1 tablet by mouth every 6 (six) hours as needed for moderate pain.   15 tablet   0   . irbesartan (AVAPRO) 300 MG tablet   Oral   Take 150 mg by mouth daily.      3   . metoprolol tartrate (LOPRESSOR) 25 MG tablet   Oral   Take 0.5 tablets (12.5 mg total) by mouth 2 (two) times daily with a meal.   60 tablet   0   . mirtazapine (REMERON) 15 MG tablet   Oral   Take 1 tablet by mouth daily.      0   . omeprazole (PRILOSEC) 20 MG capsule   Oral   Take 1 capsule by mouth daily.      10   .  pantoprazole (PROTONIX) 20 MG tablet   Oral   Take 1 tablet (20 mg total) by mouth 2 (two) times daily.         . potassium chloride SA (K-DUR,KLOR-CON) 20 MEQ tablet   Oral   Take 1 tablet (20 mEq total) by mouth daily.         . QUEtiapine (SEROQUEL) 50 MG tablet   Oral   Take 1 tablet (50 mg total) by mouth at bedtime.   7 tablet   0   . raloxifene (EVISTA) 60 MG tablet   Oral   Take 1 tablet by mouth daily.         [name] senna-docusate (SENOKOT-S) 8.6-50 MG tablet   Oral   Take 2 tablets by mouth 2 (two) times daily.         . predniSONE (STERAPRED UNI-PAK 21 TAB) 10 MG (21) TBPK tablet   Oral   Take 1 tablet (10 mg total) by mouth daily. Take steroid taper pak as directed Patient not taking: Reported on 11/21/2015   21 tablet   0     Allergies Ambien; Daypro; Oxycodone; Shellfish allergy; Ace inhibitors; and Norvasc  Family History  Problem Relation Age of Onset  . COPD Mother   . COPD Father   . Heart disease Father     Social History Social History  Substance Use Topics  . Smoking status: Never Smoker   . Smokeless tobacco: Never Used  . Alcohol Use: No    Review of Systems  Constitutional: Positive for fever/chills. Positive for myalgias. Eyes: No visual changes. ENT: Positive for congestion. No sore throat. Cardiovascular: Denies chest pain. Respiratory: Positive for nonproductive cough. Denies shortness of breath. Gastrointestinal: No abdominal pain.  Positive for nausea, vomiting and diarrhea.  No constipation. Genitourinary: Negative for dysuria. Musculoskeletal: Negative for back pain. Skin: Negative for rash. Neurological: Negative for headaches, focal weakness or numbness.  10-point ROS otherwise negative.  ____________________________________________   PHYSICAL EXAM:  VITAL SIGNS: ED Triage Vitals  Enc Vitals Group     BP --      Pulse --      Resp --      Temp --      Temp src --      SpO2 --      Weight --      Height  --      Head Cir --      Peak Flow --      Pain Score --  Pain Loc --      Pain Edu? --      Excl. in GC? --     Constitutional: Alert and oriented. Well appearing and in mild acute distress. Eyes: Conjunctivae are normal. PERRL. EOMI. Head: Atraumatic. Nose: Congestion/rhinnorhea. Mouth/Throat: Mucous membranes are moist.  Oropharynx non-erythematous. Neck: No stridor.  Supple neck without meningismus. Hematological/Lymphatic/Immunilogical: No cervical lymphadenopathy. Cardiovascular: Normal rate, regular rhythm. Grossly normal heart sounds.  Good peripheral circulation. Respiratory: Normal respiratory effort.  No retractions. Lungs with rales bibasilarly. Gastrointestinal: Soft and nontender. No distention. No abdominal bruits. No CVA tenderness. Musculoskeletal: No lower extremity tenderness nor edema.  No joint effusions. Neurologic:  Normal speech and language. No gross focal neurologic deficits are appreciated.  Skin:  Skin is hot, dry and intact. No rash noted. Specifically, no petechiae. Psychiatric: Mood and affect are normal. Speech and behavior are normal.  ____________________________________________   LABS (all labs ordered are listed, but only abnormal results are displayed)  Labs Reviewed  RAPID INFLUENZA A&B ANTIGENS ([facility] ONLY) - Abnormal; Notable for the following:    Influenza A ([facility]) DETECTED (*)    Influenza B ([facility]) NOT DETECTED (*)    All other components within normal limits  CBC WITH DIFFERENTIAL/PLATELET - Abnormal; Notable for the following:    RBC 3.37 (*)    Hemoglobin 10.3 (*)    HCT 31.0 (*)    RDW 15.2 (*)    Lymphs Abs 0.6 (*)    All other components within normal limits  COMPREHENSIVE METABOLIC PANEL - Abnormal; Notable for the following:    CO2 20 (*)    Creatinine, Ser 1.56 (*)    Calcium 8.2 (*)    Total Protein 6.4 (*)    Albumin 3.1 (*)    GFR calc non Af Amer 29 (*)    GFR calc Af Amer 34 (*)    All other components within  normal limits  CULTURE, BLOOD (ROUTINE X 2)  CULTURE, BLOOD (ROUTINE X 2)  URINE CULTURE  TROPONIN I  LACTIC ACID, PLASMA  LACTIC ACID, PLASMA  URINALYSIS COMPLETEWITH MICROSCOPIC ([facility] ONLY)   ____________________________________________  EKG  ED ECG REPORT I, SUNG,JADE J, the attending physician, personally viewed and interpreted this ECG.   Date: 11/21/2015  EKG Time: 0156  Rate: 79  Rhythm: normal EKG, normal sinus rhythm  Axis: LAD  Intervals:left bundle branch block  ST&T Change: Nonspecific  ____________________________________________  RADIOLOGY  Portable chest x-ray (viewed by me, interpreted per Dr. Gwenyth Bender: No active disease ____________________________________________   PROCEDURES  Procedure(s) performed: None  Critical Care performed: No  ____________________________________________   INITIAL IMPRESSION / ASSESSMENT AND PLAN / ED COURSE  Pertinent labs & imaging results that were available during my care of the patient were reviewed by me and considered in my medical decision making (see chart for details).  82 year old female who presents with fever, myalgias and flulike symptoms. She is mentating well and hemodynamically stable with room air saturations 97%. Symptoms consistent with SIRS. Will initiate IV fluid resuscitation, obtain screening lab work including blood cultures, chest x-ray, influenza and reassess.  ----------------------------------------- 3:03 AM on 11/21/2015 -----------------------------------------  Updated patient and caretaker of positive influenza A results. Patient is now tachypneic with audible wheezes. Will start Solu-Medrol, DuoNeb and Tamiflu. Caretaker told me that patient recently finished a course of steroids and antibiotic for bronchitis by her PCP. Discussed with hospitalist for admission. ____________________________________________   FINAL CLINICAL IMPRESSION(S) / ED DIAGNOSES  Final diagnoses:  Fever,  unspecified  fever cause  Cough  Influenza A        Irean Hong, MD 11/21/15 [phone]

## 2015-11-21 NOTE — ED Notes (Signed)
Pt arrived from home via EMS c/o NVD, fever, and generalized body aches. Per EMS pt reported having flu like symptoms for 3 days. Starting tonight around 10:00P pt had several episodes of NVD.

## 2015-11-21 NOTE — Progress Notes (Signed)
Patient received discharge instructions, pt verbalized understanding. IV was removed with no signs of infection. Dressing clean, dry intact. No skin tears or wounds present. Prescription was printed and given to patient. Patient was escorted out with staff member via wheelchair via private auto. No further needs from care management team. Son has been informed that patient is being discharged back home.

## 2015-11-21 NOTE — Discharge Summary (Signed)
[facility] [place] [facility]   PATIENT NAME: Lisa [name]    MR#:  [id number]  DATE OF BIRTH:  Sep 21, 1927  DATE OF ADMISSION:  11/21/2015 ADMITTING PHYSICIAN: Arnaldo Natal, MD  DATE OF DISCHARGE: No discharge date for patient encounter.  PRIMARY CARE PHYSICIAN: BABAOFF, MARC E, MD    ADMISSION DIAGNOSIS:   Sepsis  DISCHARGE DIAGNOSIS:  Sepsis ruled out Influenza A  SECONDARY DIAGNOSIS:   Past Medical History  Diagnosis Date  . Allergy   . Pelvis fracture (HCC)   . Wrist fracture, right   . Depression   . Spinal stenosis   . Osteoarthritis   . Hypertension   . Foot fracture     bilateral  . Brain bleed (HCC)   . V-tach (HCC)   . CHF (congestive heart failure) (HCC)   . Renal disorder     HOSPITAL COURSE:  Lisa [name]  is a 82 y.o. female admitted 11/21/2015 with chief complaint cough, myalgia. Please see H&P performed by Dr. Sheryle Hail further information. Patient presents to the hospital with the above complaints noted to be tachycardic and febrile. Found to have influenza A, heart rate temperature all improved.  DISCHARGE CONDITIONS:   Stable/improved  CONSULTS OBTAINED:     DRUG ALLERGIES:   Allergies  Allergen Reactions  . Ambien [Zolpidem Tartrate] Other (See Comments)    Reaction:  Hallucinations   . Daypro [Oxaprozin] Other (See Comments)    GI upset  . Oxycodone Nausea And Vomiting  . Shellfish Allergy Swelling and Other (See Comments)    Pts mouth and face swells.   . Ace Inhibitors Cough  . Norvasc [Amlodipine Besylate] Palpitations    DISCHARGE MEDICATIONS:   Current Discharge Medication List    START taking these medications   Details  guaiFENesin-dextromethorphan (ROBITUSSIN DM) 100-10 MG/5ML syrup Take 5 mLs by mouth every 4 (four) hours as needed for cough. Qty: 118 mL, Refills: 0    oseltamivir (TAMIFLU) 30 MG capsule Take 1 capsule (30 mg total) by mouth daily. Qty: 5 capsule, Refills: 0       CONTINUE these medications which have NOT CHANGED   Details  acetaminophen (TYLENOL) 325 MG tablet Take 1-2 tablets (325-650 mg total) by mouth every 4 (four) hours as needed for mild pain.    albuterol (PROVENTIL HFA;VENTOLIN HFA) 108 (90 Base) MCG/ACT inhaler Inhale 2 puffs into the lungs every 4 (four) hours as needed for wheezing or shortness of breath (cough). Qty: 1 Inhaler, Refills: 0    citalopram (CELEXA) 20 MG tablet Take 1 tablet (20 mg total) by mouth daily. Qty: 30 tablet, Refills: 1    ELIQUIS 5 MG TABS tablet Take 1 tablet by mouth 2 (two) times daily. Reported on 11/08/2015 Refills: 0    feeding supplement, ENSURE ENLIVE, (ENSURE ENLIVE) LIQD Take 237 mLs by mouth 3 (three) times daily between meals. Qty: 237 mL, Refills: 12    HYDROcodone-acetaminophen (NORCO/VICODIN) 5-325 MG tablet Take 1 tablet by mouth every 6 (six) hours as needed for moderate pain. Qty: 15 tablet, Refills: 0    irbesartan (AVAPRO) 300 MG tablet Take 150 mg by mouth daily. Refills: 3    metoprolol tartrate (LOPRESSOR) 25 MG tablet Take 0.5 tablets (12.5 mg total) by mouth 2 (two) times daily with a meal. Qty: 60 tablet, Refills: 0    mirtazapine (REMERON) 15 MG tablet Take 1 tablet by mouth daily. Refills: 0    omeprazole (PRILOSEC) 20 MG capsule Take 1  capsule by mouth daily. Refills: 10    pantoprazole (PROTONIX) 20 MG tablet Take 1 tablet (20 mg total) by mouth 2 (two) times daily.    potassium chloride SA (K-DUR,KLOR-CON) 20 MEQ tablet Take 1 tablet (20 mEq total) by mouth daily.    QUEtiapine (SEROQUEL) 50 MG tablet Take 1 tablet (50 mg total) by mouth at bedtime. Qty: 7 tablet, Refills: 0    raloxifene (EVISTA) 60 MG tablet Take 1 tablet by mouth daily.    senna-docusate (SENOKOT-S) 8.6-50 MG tablet Take 2 tablets by mouth 2 (two) times daily.      STOP taking these medications     predniSONE (STERAPRED UNI-PAK 21 TAB) 10 MG (21) TBPK tablet          DISCHARGE  INSTRUCTIONS:    DIET:  Cardiac diet  DISCHARGE CONDITION:  Good  ACTIVITY:  Activity as tolerated  OXYGEN:  Home Oxygen: No.   Oxygen Delivery: room air  DISCHARGE LOCATION:  home   If you experience worsening of your admission symptoms, develop shortness of breath, life threatening emergency, suicidal or homicidal thoughts you must seek medical attention immediately by calling 911 or calling your MD immediately  if symptoms less severe.  You Must read complete instructions/literature along with all the possible adverse reactions/side effects for all the Medicines you take and that have been prescribed to you. Take any new Medicines after you have completely understood and accpet all the possible adverse reactions/side effects.   Please note  You were cared for by a hospitalist during your hospital stay. If you have any questions about your discharge medications or the care you received while you were in the hospital after you are discharged, you can call the unit and asked to speak with the hospitalist on call if the hospitalist that took care of you is not available. Once you are discharged, your primary care physician will handle any further medical issues. Please note that NO REFILLS for any discharge medications will be authorized once you are discharged, as it is imperative that you return to your primary care physician (or establish a relationship with a primary care physician if you do not have one) for your aftercare needs so that they can reassess your need for medications and monitor your lab values.    On the day of Discharge:   VITAL SIGNS:  Blood pressure 153/55, pulse 80, temperature 97.7 F (36.5 C), temperature source Oral, resp. rate 20, height 5\' 3"  (1.6 m), weight 66.044 kg (145 lb 9.6 oz), SpO2 96 %.  I/O:  No intake or output data in the 24 hours ending 11/21/15 0856  PHYSICAL EXAMINATION:  GENERAL:  82 y.o.-year-old patient lying in the bed with no  acute distress.  EYES: Pupils equal, round, reactive to light and accommodation. No scleral icterus. Extraocular muscles intact.  HEENT: Head atraumatic, normocephalic. Oropharynx and nasopharynx clear.  NECK:  Supple, no jugular venous distention. No thyroid enlargement, no tenderness.  LUNGS: Normal breath sounds bilaterally, no wheezing, rales,rhonchi or crepitation. No use of accessory muscles of respiration.  CARDIOVASCULAR: S1, S2 normal. No murmurs, rubs, or gallops.  ABDOMEN: Soft, non-tender, non-distended. Bowel sounds present. No organomegaly or mass.  EXTREMITIES: No pedal edema, cyanosis, or clubbing.  NEUROLOGIC: Cranial nerves II through XII are intact. Muscle strength 5/5 in all extremities. Sensation intact. Gait not checked.  PSYCHIATRIC: The patient is alert and oriented x 3.  SKIN: No obvious rash, lesion, or ulcer.   DATA REVIEW:  CBC  Recent Labs Lab 11/21/15 0104  WBC 4.4  HGB 10.3*  HCT 31.0*  PLT 213    Chemistries   Recent Labs Lab 11/21/15 0104  NA 139  K 3.5  CL 111  CO2 20*  GLUCOSE 91  BUN 14  CREATININE 1.56*  CALCIUM 8.2*  AST 40  ALT 15  ALKPHOS 47  BILITOT 0.3    Cardiac Enzymes  Recent Labs Lab 11/21/15 0104  TROPONINI 0.03    Microbiology Results  Results for orders placed or performed during the hospital encounter of 11/21/15  Rapid Influenza A&B Antigens ([facility] only)     Status: Abnormal   Collection Time: 11/21/15  1:00 AM  Result Value Ref Range Status   Influenza A ([facility]) DETECTED (A) NEGATIVE Final   Influenza B ([facility]) NOT DETECTED (A) NEGATIVE Final    RADIOLOGY:  Dg Chest Port 1 View  11/21/2015  CLINICAL DATA:  82 year old female with cough and fever EXAM: PORTABLE CHEST 1 VIEW COMPARISON:  Radiograph dated 10/23/2015 FINDINGS: The heart size and mediastinal contours are within normal limits. Both lungs are clear. The visualized skeletal structures are unremarkable. IMPRESSION: No active disease. Electronically  Signed   By: Elgie Collard M.D.   On: 11/21/2015 02:06     Management plans discussed with the patient, family and they are in agreement.  CODE STATUS:     Code Status Orders        Start     Ordered   11/21/15 0537  Full code   Continuous     11/21/15 0536    Code Status History    Date Active Date Inactive Code Status Order ID Comments User Context   08/23/2015  9:04 AM 09/12/2015  6:56 PM Partial Code [id number]  Jacquelynn Cree, PA-C Inpatient   08/22/2015  2:55 PM 08/23/2015  9:03 AM Partial Code [id number]  Jacquelynn Cree, PA-C Inpatient   08/22/2015  2:55 PM 08/22/2015  2:55 PM Full Code [id number]  Jacquelynn Cree, PA-C Inpatient   08/09/2015  9:37 AM 08/22/2015  2:55 PM Partial Code [id number]  [name], MD Inpatient   08/03/2015  7:27 PM 08/09/2015  9:37 AM Full Code [id number]  Coletta Memos, MD Inpatient    Advance Directive Documentation        Most Recent Value   Type of Advance Directive  Healthcare Power of Attorney   Pre-existing out of facility DNR order (yellow form or pink MOST form)     "MOST" Form in Place?        TOTAL TIME TAKING CARE OF THIS PATIENT: 28 minutes.    Suhani Stillion,  Mardi Mainland.D on 11/21/2015 at 8:56 AM  Between 7am to 6pm - Pager - [phone]  After 6pm go to [email or web address] - password [email or web address]  [facility]  Office  [phone]  CC: Primary care physician; BABAOFF, Lavada Mesi, MD

## 2015-11-21 NOTE — Progress Notes (Addendum)
PT Cancellation Note  Patient Details Name: LAVANYA ROA MRN: [id number] DOB: 1927-01-02   Cancelled Treatment:    Reason Eval/Treat Not Completed: Other (comment). Consult received and chart reviewed. Pt with pending dc to home today. Has 24/7 caregivers. No needs identified for acute PT per RN. Will sign off at this time.   Jossalyn Forgione 11/21/2015, 9:58 AM  Elizabeth Palau, PT, DPT [phone]

## 2015-11-26 ENCOUNTER — Telehealth: Payer: Self-pay | Admitting: Pain Medicine

## 2015-11-26 LAB — CULTURE, BLOOD (ROUTINE X 2)
Culture: NO GROWTH
Culture: NO GROWTH

## 2015-11-26 NOTE — Telephone Encounter (Signed)
Thank you :)

## 2015-11-26 NOTE — Telephone Encounter (Signed)
Has the flu and had to go to er, needs to cancel appt for Tue. She will call back to resched

## 2015-11-27 ENCOUNTER — Ambulatory Visit: Payer: Medicare Other | Admitting: Pain Medicine

## 2015-11-27 ENCOUNTER — Telehealth: Payer: Self-pay | Admitting: Cardiovascular Disease

## 2015-11-27 NOTE — Telephone Encounter (Signed)
Faxed response regarding amiodarone and eliquis to [facility], [phone]. Pt not seen by MD on 11/21/15

## 2015-12-09 ENCOUNTER — Encounter: Payer: Self-pay | Admitting: Emergency Medicine

## 2015-12-09 ENCOUNTER — Emergency Department: Payer: Medicare Other

## 2015-12-09 ENCOUNTER — Inpatient Hospital Stay
Admission: EM | Admit: 2015-12-09 | Discharge: 2015-12-12 | DRG: 069 | Disposition: A | Discharge status: Home Health | Payer: Medicare Other | Attending: Internal Medicine | Admitting: Internal Medicine

## 2015-12-09 DIAGNOSIS — G459 Transient cerebral ischemic attack, unspecified: Secondary | ICD-10-CM | POA: Diagnosis not present

## 2015-12-09 DIAGNOSIS — F329 Major depressive disorder, single episode, unspecified: Secondary | ICD-10-CM | POA: Diagnosis present

## 2015-12-09 DIAGNOSIS — N289 Disorder of kidney and ureter, unspecified: Secondary | ICD-10-CM | POA: Diagnosis present

## 2015-12-09 DIAGNOSIS — Z8674 Personal history of sudden cardiac arrest: Secondary | ICD-10-CM

## 2015-12-09 DIAGNOSIS — R519 Headache, unspecified: Secondary | ICD-10-CM | POA: Diagnosis present

## 2015-12-09 DIAGNOSIS — I491 Atrial premature depolarization: Secondary | ICD-10-CM | POA: Diagnosis not present

## 2015-12-09 DIAGNOSIS — Z888 Allergy status to other drugs, medicaments and biological substances status: Secondary | ICD-10-CM

## 2015-12-09 DIAGNOSIS — R4182 Altered mental status, unspecified: Secondary | ICD-10-CM

## 2015-12-09 DIAGNOSIS — I472 Ventricular tachycardia: Secondary | ICD-10-CM | POA: Diagnosis present

## 2015-12-09 DIAGNOSIS — I420 Dilated cardiomyopathy: Secondary | ICD-10-CM | POA: Insufficient documentation

## 2015-12-09 DIAGNOSIS — Z886 Allergy status to analgesic agent status: Secondary | ICD-10-CM

## 2015-12-09 DIAGNOSIS — I110 Hypertensive heart disease with heart failure: Secondary | ICD-10-CM | POA: Diagnosis present

## 2015-12-09 DIAGNOSIS — Z7901 Long term (current) use of anticoagulants: Secondary | ICD-10-CM

## 2015-12-09 DIAGNOSIS — I480 Paroxysmal atrial fibrillation: Secondary | ICD-10-CM | POA: Diagnosis not present

## 2015-12-09 DIAGNOSIS — I5022 Chronic systolic (congestive) heart failure: Secondary | ICD-10-CM | POA: Diagnosis present

## 2015-12-09 DIAGNOSIS — Z86718 Personal history of other venous thrombosis and embolism: Secondary | ICD-10-CM

## 2015-12-09 DIAGNOSIS — I4891 Unspecified atrial fibrillation: Secondary | ICD-10-CM | POA: Diagnosis present

## 2015-12-09 DIAGNOSIS — R451 Restlessness and agitation: Secondary | ICD-10-CM | POA: Diagnosis not present

## 2015-12-09 DIAGNOSIS — R51 Headache: Secondary | ICD-10-CM

## 2015-12-09 DIAGNOSIS — Z8249 Family history of ischemic heart disease and other diseases of the circulatory system: Secondary | ICD-10-CM

## 2015-12-09 DIAGNOSIS — F0390 Unspecified dementia without behavioral disturbance: Secondary | ICD-10-CM | POA: Insufficient documentation

## 2015-12-09 DIAGNOSIS — Z87820 Personal history of traumatic brain injury: Secondary | ICD-10-CM

## 2015-12-09 HISTORY — DX: Chronic systolic (congestive) heart failure: I50.22

## 2015-12-09 LAB — BASIC METABOLIC PANEL
ANION GAP: 3 — AB (ref 5–15)
BUN: 15 mg/dL (ref 6–20)
CALCIUM: 7.6 mg/dL — AB (ref 8.9–10.3)
CO2: 24 mmol/L (ref 22–32)
Chloride: 111 mmol/L (ref 101–111)
Creatinine, Ser: 1.46 mg/dL — ABNORMAL HIGH (ref 0.44–1.00)
GFR calc Af Amer: 36 mL/min — ABNORMAL LOW (ref >60)
GFR calc non Af Amer: 31 mL/min — ABNORMAL LOW (ref >60)
GLUCOSE: 83 mg/dL (ref 65–99)
Potassium: 4.1 mmol/L (ref 3.5–5.1)
Sodium: 138 mmol/L (ref 135–145)

## 2015-12-09 LAB — CBC
HEMATOCRIT: 33 % — AB (ref 35.0–47.0)
HEMOGLOBIN: 10.6 g/dL — AB (ref 12.0–16.0)
MCH: 29.3 pg (ref 26.0–34.0)
MCHC: 32.2 g/dL (ref 32.0–36.0)
MCV: 91.1 fL (ref 80.0–100.0)
Platelets: 232 10*3/uL (ref 150–440)
RBC: 3.63 MIL/uL — AB (ref 3.80–5.20)
RDW: 15.9 % — ABNORMAL HIGH (ref 11.5–14.5)
WBC: 8.9 10*3/uL (ref 3.6–11.0)

## 2015-12-09 LAB — PROTIME-INR
INR: 1.05
Prothrombin Time: 13.9 seconds (ref 11.4–15.0)

## 2015-12-09 LAB — SEDIMENTATION RATE: SED RATE: 41 mm/h — AB (ref 0–30)

## 2015-12-09 LAB — GLUCOSE, CAPILLARY: Glucose-Capillary: 87 mg/dL (ref 65–99)

## 2015-12-09 MED ORDER — ASPIRIN 81 MG PO CHEW
324.0000 mg | CHEWABLE_TABLET | Freq: Once | ORAL | Status: AC
  Administered 2015-12-09: 324 mg via ORAL
  Filled 2015-12-09: qty 4

## 2015-12-09 MED ORDER — QUETIAPINE FUMARATE 25 MG PO TABS
50.0000 mg | ORAL_TABLET | ORAL | Status: AC
  Filled 2015-12-09: qty 2

## 2015-12-09 MED ORDER — HYDROCODONE-ACETAMINOPHEN 5-325 MG PO TABS
1.0000 | ORAL_TABLET | Freq: Once | ORAL | Status: AC
  Administered 2015-12-09: 1 via ORAL
  Filled 2015-12-09: qty 1

## 2015-12-09 MED ORDER — HALOPERIDOL LACTATE 5 MG/ML IJ SOLN
1.0000 mg | Freq: Once | INTRAMUSCULAR | Status: AC
  Administered 2015-12-09: 1 mg via INTRAMUSCULAR
  Filled 2015-12-09: qty 1

## 2015-12-09 MED ORDER — LORAZEPAM 2 MG/ML IJ SOLN
0.5000 mg | Freq: Once | INTRAMUSCULAR | Status: AC
  Administered 2015-12-09: 0.5 mg via INTRAVENOUS
  Filled 2015-12-09: qty 1

## 2015-12-09 NOTE — ED Notes (Signed)
Pt states that she doesn't normally have headaches like this, states that in November she fell and hit her head, pt states that she has been having more headaches since nov, pt states that the pain is on the rt side of her head and face, pt's caregiver states that she took tylenol and was pacing the floors with the pain. Pt states the headache started around 1500

## 2015-12-09 NOTE — ED Provider Notes (Signed)
[facility] Emergency Department Provider Note  ____________________________________________  Time seen: Approximately 8:52 PM  I have reviewed the triage vital signs and the nursing notes.   HISTORY  Chief Complaint Headache    HPI Lisa [name] is a 82 y.o. female presents for evaluation of headache.  Patient reports that a few days ago she had a headache. She does occasionally get them since the time of an injury or she had a bad head bleed a few months ago.  Today she was in her home started experiencing a mild headache primarily over the front of the right side of the face. She then developed increasing pain, reports moderate to severe throbbing headache that caused her to get up and walk the house. This started about 2 PM. Her caretaker arrived in about 4 PM she had no episode where her speech seems slurred, last about 15 minutes and resolved. No numbness tingling or weakness except they noted her right eye seemed a little bit twitchy or week and resolved.  At the present time she relates that she has a mild headache it is slightly throbbing frontal in nature. Not having a trouble speaking. No numbness tingling or weakness in the arms or legs.   Past Medical History  Diagnosis Date  . Allergy   . Pelvis fracture ([place])   . Wrist fracture, right   . Depression   . Spinal stenosis   . Osteoarthritis   . Hypertension   . Foot fracture     bilateral  . Brain bleed ([place])   . V-tach ([place])   . CHF (congestive heart failure) ([place])   . Renal disorder     Patient Active Problem List   Diagnosis Date Noted  . Influenza A 11/21/2015  . Ventricular tachycardia ([place]) 11/09/2015  . Constipation 09/12/2015  . Insomnia due to anxiety and fear 09/12/2015  . Depression, controlled 09/12/2015  . Bacterial UTI 08/27/2015  . DVT, lower extremity ([place]) 08/27/2015  . TBI (traumatic brain injury) ([place]) 08/22/2015  . ICH (intracerebral hemorrhage) ([place])   .  Abscess   . Hypomagnesemia   . SDH (subdural hematoma) ([place])   . Boil of upper extremity   . Pneumonia due to Haemophilus influenzae ([place])   . Chronic systolic CHF (congestive heart failure) ([place])   . Acute on chronic renal failure ([place])   . Hypokalemia   . Bright red blood per rectum   . Dysphagia   . Pressure ulcer 08/16/2015  . Acute respiratory failure with hypoxemia ([place])   . Hypernatremia   . Subdural hematoma ([place]) 08/03/2015  . Lumbar radiculopathy 06/06/2015  . Spinal stenosis, lumbar region, with neurogenic claudication 04/02/2015  . Degenerative lumbar disc 02/15/2015  . Lumbar post-laminectomy syndrome 02/15/2015  . Facet syndrome, lumbar 02/15/2015  . Sacroiliac joint dysfunction 02/15/2015    Past Surgical History  Procedure Laterality Date  . Back surgery N/A   . Breast lumpectomy N/A     Current Outpatient Rx  Name  Route  Sig  Dispense  Refill  . acetaminophen (TYLENOL) 325 MG tablet   Oral   Take 1-2 tablets (325-650 mg total) by mouth every 4 (four) hours as needed for mild pain.         [name] albuterol (PROVENTIL HFA;VENTOLIN HFA) 108 (90 Base) MCG/ACT inhaler   Inhalation   Inhale 2 puffs into the lungs every 4 (four) hours as needed for wheezing or shortness of breath (cough).   1 Inhaler   0   .  citalopram (CELEXA) 20 MG tablet   Oral   Take 1 tablet (20 mg total) by mouth daily.   30 tablet   1   . ELIQUIS 5 MG TABS tablet   Oral   Take 1 tablet by mouth 2 (two) times daily. Reported on 11/08/2015      0     Dispense as written.   . feeding supplement, ENSURE ENLIVE, (ENSURE ENLIVE) LIQD   Oral   Take 237 mLs by mouth 3 (three) times daily between meals.   237 mL   12   . guaiFENesin-dextromethorphan (ROBITUSSIN DM) 100-10 MG/5ML syrup   Oral   Take 5 mLs by mouth every 4 (four) hours as needed for cough.   118 mL   0   . HYDROcodone-acetaminophen (NORCO/VICODIN) 5-325 MG tablet   Oral   Take 1 tablet by mouth every 6 (six)  hours as needed for moderate pain.   15 tablet   0   . irbesartan (AVAPRO) 300 MG tablet   Oral   Take 150 mg by mouth daily.      3   . metoprolol tartrate (LOPRESSOR) 25 MG tablet   Oral   Take 0.5 tablets (12.5 mg total) by mouth 2 (two) times daily with a meal.   60 tablet   0   . mirtazapine (REMERON) 15 MG tablet   Oral   Take 1 tablet by mouth daily.      0   . omeprazole (PRILOSEC) 20 MG capsule   Oral   Take 1 capsule by mouth daily.      10   . pantoprazole (PROTONIX) 20 MG tablet   Oral   Take 1 tablet (20 mg total) by mouth 2 (two) times daily.         . potassium chloride SA (K-DUR,KLOR-CON) 20 MEQ tablet   Oral   Take 1 tablet (20 mEq total) by mouth daily.         . QUEtiapine (SEROQUEL) 50 MG tablet   Oral   Take 1 tablet (50 mg total) by mouth at bedtime.   7 tablet   0   . raloxifene (EVISTA) 60 MG tablet   Oral   Take 1 tablet by mouth daily.         [name] senna-docusate (SENOKOT-S) 8.6-50 MG tablet   Oral   Take 2 tablets by mouth 2 (two) times daily.         [name] oseltamivir (TAMIFLU) 30 MG capsule   Oral   Take 1 capsule (30 mg total) by mouth daily. Patient not taking: Reported on 12/09/2015   5 capsule   0     Allergies Ambien; Daypro; Oxycodone; Shellfish allergy; Ace inhibitors; and Norvasc  Family History  Problem Relation Age of Onset  . COPD Mother   . COPD Father   . Heart disease Father     Social History Social History  Substance Use Topics  . Smoking status: Never Smoker   . Smokeless tobacco: Never Used  . Alcohol Use: No    Review of Systems Constitutional: No fever/chills Eyes: No visual changes. ENT: No sore throat. Cardiovascular: Denies chest pain. Respiratory: Denies shortness of breath. Gastrointestinal: No abdominal pain.  No nausea, no vomiting.  No diarrhea.  No constipation. Genitourinary: Negative for dysuria. Musculoskeletal: Negative for back pain. Skin: Negative for  rash. Neurological: Negative for focal weakness or numbness.  10-point ROS otherwise negative.  ____________________________________________   PHYSICAL EXAM:  VITAL SIGNS:  ED Triage Vitals  Enc Vitals Group     BP 12/09/15 2008 183/94 mmHg     Pulse Rate 12/09/15 2008 103     Resp 12/09/15 2008 16     Temp 12/09/15 2008 98 F (36.7 C)     Temp Source 12/09/15 2008 Oral     SpO2 12/09/15 2008 99 %     Weight 12/09/15 2008 145 lb (65.772 kg)     Height 12/09/15 2008 5' 2"  (1.575 m)     Head Cir --      Peak Flow --      Pain Score 12/09/15 2014 10     Pain Loc --      Pain Edu? --      Excl. in [place]? --    Constitutional: Alert and oriented. Well appearing and in no acute distress. Eyes: Conjunctivae are normal. PERRL. EOMI. Head: Atraumatic. Nose: No congestion/rhinnorhea. Mouth/Throat: Mucous membranes are moist.  Oropharynx non-erythematous. Neck: No stridor.  No meningismus Cardiovascular: Normal rate, regular rhythm. Grossly normal heart sounds.  Good peripheral circulation. Respiratory: Normal respiratory effort.  No retractions. Lungs CTAB. Gastrointestinal: Soft and nontender. No distention.  Musculoskeletal: No lower extremity tenderness nor edema.  No joint effusions. Neurologic:  Normal speech and language. No gross focal neurologic deficits are appreciated.  NIH score equals 0, performed by me at bedside. The patient has no pronator drift. The patient has normal cranial nerve exam. Extraocular movements are normal. Visual fields are normal. Patient has 5 out of 5 strength in all extremities. There is no numbness or gross, acute sensory abnormality in the extremities bilaterally. No speech disturbance. No dysarthria. No aphasia. No ataxia. Normal finger nose finger bilat. Patient speaking in full and clear sentences.   Skin:  Skin is warm, dry and intact. No rash noted. Psychiatric: Mood and affect are normal. Speech and behavior are  normal.  ____________________________________________   LABS (all labs ordered are listed, but only abnormal results are displayed)  Labs Reviewed  CBC - Abnormal; Notable for the following:    RBC 3.63 (*)    Hemoglobin 10.6 (*)    HCT 33.0 (*)    RDW 15.9 (*)    All other components within normal limits  BASIC METABOLIC PANEL - Abnormal; Notable for the following:    Creatinine, Ser 1.46 (*)    Calcium 7.6 (*)    GFR calc non Af Amer 31 (*)    GFR calc Af Amer 36 (*)    Anion gap 3 (*)    All other components within normal limits  SEDIMENTATION RATE - Abnormal; Notable for the following:    Sed Rate 41 (*)    All other components within normal limits  PROTIME-INR  GLUCOSE, CAPILLARY  CBG MONITORING, ED   ____________________________________________  EKG  [place] arrhythmia 2110 Normal sinus rhythm Left bundle branch block QRS 135 QTC 500 Reviewed and interpreted as left bundle-branch block. No evidence of obvious ischemic change ____________________________________________  RADIOLOGY  CT Head Wo Contrast (Final result) Result time: 12/09/15 20:38:35   Final result by Rad Results In Interface (12/09/15 20:38:35)   Narrative:   CLINICAL DATA: Severe headache at 4 p.m. today. History of head injury with intracranial hemorrhage on 11/16.  EXAM: CT HEAD WITHOUT CONTRAST  TECHNIQUE: Contiguous axial images were obtained from the base of the skull through the vertex without intravenous contrast.  COMPARISON: 08/30/2015  FINDINGS: Mild diffuse cerebral atrophy. Mild ventricular dilatation consistent with central atrophy. Low-attenuation changes  in the deep white matter consistent with small vessel ischemia. Low-attenuation areas in both anterior frontal lobes consistent with old areas of encephalomalacia likely posttraumatic or ischemic. These areas have become better defined since previous study suggesting typical evolution of previous acute change. No  mass effect or midline shift. No sulcal effacement. Gray-white matter junctions are distinct. Basal cisterns are not effaced. No abnormal extra-axial fluid collections. No acute intracranial hemorrhage.  Calvarium appears intact. Air-fluid levels in the maxillary antra bilaterally may indicate sinusitis. Mastoid air cells are not opacified. Vascular calcifications.  IMPRESSION: No acute intracranial abnormalities. Chronic atrophy and small vessel ischemic changes bilaterally with old encephalomalacia in the anterior frontal lobes. Air-fluid levels in the paranasal sinuses may indicate acute sinusitis.   Electronically Signed By: Lucienne Capers M.D. On: 12/09/2015 20:38    ____________________________________________   PROCEDURES  Procedure(s) performed: None  Critical Care performed: No  ____________________________________________   INITIAL IMPRESSION / ASSESSMENT AND PLAN / ED COURSE  Pertinent labs & imaging results that were available during my care of the patient were reviewed by me and considered in my medical decision making (see chart for details).  Patient for evaluation of headache. Patient reports a moderate bifrontal headache. She evidently does have a history of headaches since having a traumatic head injury. CT today does not demonstrate any abnormal obvious acute ischemic change. She does have slightly elevated ESR but no temporal artery tenderness and normal pulsations bilateral. I discussed her case and care along with clinical history with Dr. Doy Mince. We will admit the patient for an MRI/TIA evaluation.  No evidence of acute cardiopulmonary symptoms. Hemodynamics are stable. The present time her neurologic exam seems to be intact. She has no fever, normal white count, no infectious symptoms. No meningismus. Feels most likely related to possible chronic headaches due to her brain injury, however given associated dysarthria that occur during today's  episode consideration for TIA, possible small seizure focus, and other etiologies considered. She is not anticoagulated, takes no aspirin she was administered this today given no bleeding noted on CT and we will bring her in for further evaluation.  ----------------------------------------- 10:59 PM on 12/09/2015 -----------------------------------------  Patient has begun taking an lines, slight wandering type activity. She is trying to get up out of bed. Her sitter and caretaker who knows her well reports that this type of activity does occur in the evenings from time to time after her head injury occurred and they believe it's due to her traumatic brain injury. I will give her some Ativan as well as she has a history of needing Seroquel dosing and night which is ordered. I ordered a sitter for safety.  Admitting to the hospital for ongoing evaluation and treatment. ____________________________________________   FINAL CLINICAL IMPRESSION(S) / ED DIAGNOSES  Final diagnoses:  Transient cerebral ischemia, unspecified transient cerebral ischemia type      Delman Kitten, MD 12/09/15 [phone]

## 2015-12-09 NOTE — ED Notes (Signed)
MD Quale at bedside. 

## 2015-12-10 ENCOUNTER — Observation Stay: Payer: Medicare Other

## 2015-12-10 ENCOUNTER — Encounter: Payer: Self-pay | Admitting: Internal Medicine

## 2015-12-10 ENCOUNTER — Observation Stay: Admit: 2015-12-10 | Payer: Medicare Other

## 2015-12-10 DIAGNOSIS — I639 Cerebral infarction, unspecified: Secondary | ICD-10-CM | POA: Diagnosis not present

## 2015-12-10 DIAGNOSIS — R519 Headache, unspecified: Secondary | ICD-10-CM | POA: Diagnosis present

## 2015-12-10 DIAGNOSIS — G459 Transient cerebral ischemic attack, unspecified: Secondary | ICD-10-CM | POA: Diagnosis present

## 2015-12-10 DIAGNOSIS — R51 Headache: Secondary | ICD-10-CM

## 2015-12-10 LAB — COMPREHENSIVE METABOLIC PANEL
ALBUMIN: 2.8 g/dL — AB (ref 3.5–5.0)
ALK PHOS: 54 U/L (ref 38–126)
ALT: 11 U/L — AB (ref 14–54)
ANION GAP: 3 — AB (ref 5–15)
AST: 29 U/L (ref 15–41)
BUN: 15 mg/dL (ref 6–20)
CALCIUM: 7.3 mg/dL — AB (ref 8.9–10.3)
CHLORIDE: 111 mmol/L (ref 101–111)
CO2: 24 mmol/L (ref 22–32)
Creatinine, Ser: 1.42 mg/dL — ABNORMAL HIGH (ref 0.44–1.00)
GFR calc non Af Amer: 32 mL/min — ABNORMAL LOW (ref >60)
GFR, EST AFRICAN AMERICAN: 37 mL/min — AB (ref >60)
GLUCOSE: 82 mg/dL (ref 65–99)
Potassium: 3.8 mmol/L (ref 3.5–5.1)
SODIUM: 138 mmol/L (ref 135–145)
Total Bilirubin: 0.6 mg/dL (ref 0.3–1.2)
Total Protein: 5.4 g/dL — ABNORMAL LOW (ref 6.5–8.1)

## 2015-12-10 LAB — GLUCOSE, CAPILLARY
GLUCOSE-CAPILLARY: 86 mg/dL (ref 65–99)
GLUCOSE-CAPILLARY: 82 mg/dL (ref 65–99)
GLUCOSE-CAPILLARY: 113 mg/dL — AB (ref 65–99)
GLUCOSE-CAPILLARY: 80 mg/dL (ref 65–99)
Glucose-Capillary: 82 mg/dL (ref 65–99)
Glucose-Capillary: 69 mg/dL (ref 65–99)

## 2015-12-10 LAB — URINALYSIS COMPLETE WITH MICROSCOPIC (ARMC ONLY)
Bilirubin Urine: NEGATIVE
Glucose, UA: NEGATIVE mg/dL
HGB URINE DIPSTICK: NEGATIVE
Ketones, ur: NEGATIVE mg/dL
NITRITE: NEGATIVE
PH: 6 (ref 5.0–8.0)
Protein, ur: NEGATIVE mg/dL
SPECIFIC GRAVITY, URINE: 1.006 (ref 1.005–1.030)

## 2015-12-10 LAB — LIPID PANEL
CHOL/HDL RATIO: 4 ratio
Cholesterol: 174 mg/dL (ref 0–200)
HDL: 44 mg/dL (ref >40)
LDL CALC: 107 mg/dL — AB (ref 0–99)
Triglycerides: 115 mg/dL (ref <150)
VLDL: 23 mg/dL (ref 0–40)

## 2015-12-10 LAB — CBC
HCT: 29.2 % — ABNORMAL LOW (ref 35.0–47.0)
Hemoglobin: 9.7 g/dL — ABNORMAL LOW (ref 12.0–16.0)
MCH: 29.9 pg (ref 26.0–34.0)
MCHC: 33.2 g/dL (ref 32.0–36.0)
MCV: 90 fL (ref 80.0–100.0)
PLATELETS: 200 10*3/uL (ref 150–440)
RBC: 3.24 MIL/uL — AB (ref 3.80–5.20)
RDW: 15.2 % — ABNORMAL HIGH (ref 11.5–14.5)
WBC: 8.5 10*3/uL (ref 3.6–11.0)

## 2015-12-10 MED ORDER — ASPIRIN 325 MG PO TABS
325.0000 mg | ORAL_TABLET | Freq: Every day | ORAL | Status: DC
  Administered 2015-12-11 – 2015-12-12 (×2): 325 mg via ORAL
  Filled 2015-12-10 (×3): qty 1

## 2015-12-10 MED ORDER — QUETIAPINE FUMARATE 25 MG PO TABS
50.0000 mg | ORAL_TABLET | Freq: Every day | ORAL | Status: DC
  Administered 2015-12-10 – 2015-12-11 (×2): 50 mg via ORAL
  Filled 2015-12-10 (×2): qty 2

## 2015-12-10 MED ORDER — PANTOPRAZOLE SODIUM 40 MG PO TBEC
40.0000 mg | DELAYED_RELEASE_TABLET | Freq: Every day | ORAL | Status: DC
  Administered 2015-12-11 – 2015-12-12 (×2): 40 mg via ORAL
  Filled 2015-12-10 (×2): qty 1

## 2015-12-10 MED ORDER — HALOPERIDOL LACTATE 5 MG/ML IJ SOLN
1.0000 mg | Freq: Four times a day (QID) | INTRAMUSCULAR | Status: DC | PRN
  Administered 2015-12-11: 1 mg via INTRAVENOUS
  Filled 2015-12-10: qty 1

## 2015-12-10 MED ORDER — SENNOSIDES-DOCUSATE SODIUM 8.6-50 MG PO TABS
1.0000 | ORAL_TABLET | Freq: Every evening | ORAL | Status: DC | PRN
  Administered 2015-12-10: 1 via ORAL

## 2015-12-10 MED ORDER — ENSURE ENLIVE PO LIQD
237.0000 mL | Freq: Three times a day (TID) | ORAL | Status: DC
  Administered 2015-12-11 – 2015-12-12 (×5): 237 mL via ORAL

## 2015-12-10 MED ORDER — ASPIRIN 300 MG RE SUPP
300.0000 mg | Freq: Every day | RECTAL | Status: DC
  Filled 2015-12-10 (×3): qty 1

## 2015-12-10 MED ORDER — LORAZEPAM 2 MG/ML IJ SOLN: 1.0000 mg | Freq: Four times a day (QID) | INTRAMUSCULAR | Status: DC | PRN

## 2015-12-10 MED ORDER — CITALOPRAM HYDROBROMIDE 20 MG PO TABS
20.0000 mg | ORAL_TABLET | Freq: Every day | ORAL | Status: DC
  Administered 2015-12-11 – 2015-12-12 (×2): 20 mg via ORAL
  Filled 2015-12-10 (×2): qty 1

## 2015-12-10 MED ORDER — RALOXIFENE HCL 60 MG PO TABS
60.0000 mg | ORAL_TABLET | Freq: Every day | ORAL | Status: DC
  Administered 2015-12-11 – 2015-12-12 (×2): 60 mg via ORAL
  Filled 2015-12-10 (×3): qty 1

## 2015-12-10 MED ORDER — LORAZEPAM 2 MG/ML IJ SOLN
1.0000 mg | Freq: Once | INTRAMUSCULAR | Status: AC
  Administered 2015-12-10: 1 mg via INTRAVENOUS
  Filled 2015-12-10: qty 1

## 2015-12-10 MED ORDER — APIXABAN 5 MG PO TABS
5.0000 mg | ORAL_TABLET | Freq: Two times a day (BID) | ORAL | Status: DC
  Administered 2015-12-10 – 2015-12-12 (×4): 5 mg via ORAL
  Filled 2015-12-10 (×4): qty 1

## 2015-12-10 MED ORDER — ACETAMINOPHEN 325 MG PO TABS: 650.0000 mg | ORAL_TABLET | ORAL | Status: DC | PRN

## 2015-12-10 MED ORDER — STROKE: EARLY STAGES OF RECOVERY BOOK
Freq: Once | Status: AC
  Administered 2015-12-10: 04:00:00

## 2015-12-10 MED ORDER — MORPHINE SULFATE (PF) 2 MG/ML IV SOLN
1.0000 mg | Freq: Once | INTRAVENOUS | Status: AC
  Administered 2015-12-10: 1 mg via INTRAVENOUS
  Filled 2015-12-10: qty 1

## 2015-12-10 MED ORDER — MIRTAZAPINE 15 MG PO TABS
15.0000 mg | ORAL_TABLET | Freq: Every day | ORAL | Status: DC
  Administered 2015-12-11 – 2015-12-12 (×2): 15 mg via ORAL
  Filled 2015-12-10 (×2): qty 1

## 2015-12-10 MED ORDER — POTASSIUM CHLORIDE CRYS ER 20 MEQ PO TBCR
20.0000 meq | EXTENDED_RELEASE_TABLET | Freq: Every day | ORAL | Status: DC
  Administered 2015-12-11 – 2015-12-12 (×2): 20 meq via ORAL
  Filled 2015-12-10 (×2): qty 1

## 2015-12-10 MED ORDER — ALBUTEROL SULFATE (2.5 MG/3ML) 0.083% IN NEBU: 2.5000 mg | INHALATION_SOLUTION | RESPIRATORY_TRACT | Status: DC | PRN

## 2015-12-10 MED ORDER — ACETAMINOPHEN 325 MG PO TABS: 325.0000 mg | ORAL_TABLET | ORAL | Status: DC | PRN

## 2015-12-10 MED ORDER — IRBESARTAN 150 MG PO TABS
150.0000 mg | ORAL_TABLET | Freq: Every day | ORAL | Status: DC
  Administered 2015-12-11 – 2015-12-12 (×2): 150 mg via ORAL
  Filled 2015-12-10 (×2): qty 1

## 2015-12-10 MED ORDER — ACETAMINOPHEN 650 MG RE SUPP
650.0000 mg | RECTAL | Status: DC | PRN
  Administered 2015-12-10: 650 mg via RECTAL
  Filled 2015-12-10 (×2): qty 1

## 2015-12-10 MED ORDER — SENNOSIDES-DOCUSATE SODIUM 8.6-50 MG PO TABS
2.0000 | ORAL_TABLET | Freq: Two times a day (BID) | ORAL | Status: DC
  Administered 2015-12-11 – 2015-12-12 (×3): 2 via ORAL
  Filled 2015-12-10 (×4): qty 2

## 2015-12-10 MED ORDER — LORAZEPAM 2 MG/ML IJ SOLN
0.5000 mg | Freq: Once | INTRAMUSCULAR | Status: AC
  Administered 2015-12-10: 0.5 mg via INTRAVENOUS
  Filled 2015-12-10: qty 1

## 2015-12-10 MED ORDER — SODIUM CHLORIDE 0.9 % IV SOLN
INTRAVENOUS | Status: DC
  Administered 2015-12-10: 04:00:00 via INTRAVENOUS

## 2015-12-10 MED ORDER — HYDROCODONE-ACETAMINOPHEN 5-325 MG PO TABS: 1.0000 | ORAL_TABLET | Freq: Four times a day (QID) | ORAL | Status: DC | PRN

## 2015-12-10 NOTE — Care Management (Signed)
[facility] for [facility]. Readmitted at this time for TIA, Headache and HTN. PT/OT consult pending.

## 2015-12-10 NOTE — Progress Notes (Signed)
[facility] [place] [facility]   PATIENT NAME: Lisa LeepFaye [name]    MR#:  [id number]  DATE OF BIRTH:  08/14/1927  SUBJECTIVE:  CHIEF COMPLAINT:   Chief Complaint  Patient presents with  . Headache  feels ok, sitter at bedside worried about falls at home  REVIEW OF SYSTEMS:  Review of Systems  Constitutional: Negative for fever, weight loss, malaise/fatigue and diaphoresis.  HENT: Negative for ear discharge, ear pain, hearing loss, nosebleeds, sore throat and tinnitus.   Eyes: Negative for blurred vision and pain.  Respiratory: Negative for cough, hemoptysis, shortness of breath and wheezing.   Cardiovascular: Negative for chest pain, palpitations, orthopnea and leg swelling.  Gastrointestinal: Negative for heartburn, nausea, vomiting, abdominal pain, diarrhea, constipation and blood in stool.  Genitourinary: Negative for dysuria, urgency and frequency.  Musculoskeletal: Positive for falls. Negative for myalgias and back pain.  Skin: Negative for itching and rash.  Neurological: Negative for dizziness, tingling, tremors, focal weakness, seizures, weakness and headaches.  Psychiatric/Behavioral: Negative for depression. The patient is not nervous/anxious.     DRUG ALLERGIES:   Allergies  Allergen Reactions  . Ambien [Zolpidem Tartrate] Other (See Comments)    Reaction:  Hallucinations   . Daypro [Oxaprozin] Other (See Comments)    GI upset  . Oxycodone Nausea And Vomiting  . Shellfish Allergy Swelling and Other (See Comments)    Pts mouth and face swells.   . Ace Inhibitors Cough  . Norvasc [Amlodipine Besylate] Palpitations   VITALS:  Blood pressure 174/63, pulse 91, temperature 99 F (37.2 C), temperature source Oral, resp. rate 20, height 5\' 2"  (1.575 m), weight 68.357 kg (150 lb 11.2 oz), SpO2 99 %. PHYSICAL EXAMINATION:  Physical Exam  Constitutional: She is oriented to person, place, and time and well-developed, well-nourished, and in no  distress.  HENT:  Head: Normocephalic and atraumatic.  Eyes: Conjunctivae and EOM are normal. Pupils are equal, round, and reactive to light.  Neck: Normal range of motion. Neck supple. No tracheal deviation present. No thyromegaly present.  Cardiovascular: Normal rate, regular rhythm and normal heart sounds.   Pulmonary/Chest: Effort normal and breath sounds normal. No respiratory distress. She has no wheezes. She exhibits no tenderness.  Abdominal: Soft. Bowel sounds are normal. She exhibits no distension. There is no tenderness.  Musculoskeletal: Normal range of motion.  Neurological: She is alert and oriented to person, place, and time. No cranial nerve deficit.  Skin: Skin is warm and dry. No rash noted.  Psychiatric: Mood and affect normal.   LABORATORY PANEL:   CBC  Recent Labs Lab 12/10/15 0537  WBC 8.5  HGB 9.7*  HCT 29.2*  PLT 200   ------------------------------------------------------------------------------------------------------------------ Chemistries   Recent Labs Lab 12/10/15 0537  NA 138  K 3.8  CL 111  CO2 24  GLUCOSE 82  BUN 15  CREATININE 1.42*  CALCIUM 7.3*  AST 29  ALT 11*  ALKPHOS 54  BILITOT 0.6   RADIOLOGY:  Ct Head Wo Contrast  12/09/2015  CLINICAL DATA:  Severe headache at 4 p.m. today. History of head injury with intracranial hemorrhage on 11/16. EXAM: CT HEAD WITHOUT CONTRAST TECHNIQUE: Contiguous axial images were obtained from the base of the skull through the vertex without intravenous contrast. COMPARISON:  08/30/2015 FINDINGS: Mild diffuse cerebral atrophy. Mild ventricular dilatation consistent with central atrophy. Low-attenuation changes in the deep white matter consistent with small vessel ischemia. Low-attenuation areas in both anterior frontal lobes consistent with old areas of encephalomalacia likely  posttraumatic or ischemic. These areas have become better defined since previous study suggesting typical evolution of previous  acute change. No mass effect or midline shift. No sulcal effacement. Gray-white matter junctions are distinct. Basal cisterns are not effaced. No abnormal extra-axial fluid collections. No acute intracranial hemorrhage. Calvarium appears intact. Air-fluid levels in the maxillary antra bilaterally may indicate sinusitis. Mastoid air cells are not opacified. Vascular calcifications. IMPRESSION: No acute intracranial abnormalities. Chronic atrophy and small vessel ischemic changes bilaterally with old encephalomalacia in the anterior frontal lobes. Air-fluid levels in the paranasal sinuses may indicate acute sinusitis. Electronically Signed   By: Burman Nieves M.D.   On: 12/09/2015 20:38   ASSESSMENT AND PLAN:   82 year old elderly female patient with history of hypertension, ventricular tachycardia, congestive heart failure, cerebral hemorrhage resented to the emergency room with headache and the numbness of the left side of the face and difficulty speech.  1. Transient ischemic attack: MRI brain, Carotid ultrasound to rule out obstruction, just recently had echocardiogram (on 2/27 showing EF 35-40%) so will hold off. Appreciate Neurology consultation, continue asa, eliquis. Await PT/OT eval 2. Headache: resolved 3. Hypertension: will let it autoregulate in acute phase. 4. Congestive heart failure: chronic systolic, well compensated at this time.    All the records are reviewed and case discussed with Care Management/Social Worker. Management plans discussed with the patient, family and they are in agreement.  CODE STATUS: FULL CODE  TOTAL TIME TAKING CARE OF THIS PATIENT: 25 minutes.   More than 50% of the time was spent in counseling/coordination of care: YES (caregiver & sitter at bedside, caregiver requests son and facility to be notified for placement)  POSSIBLE D/C IN 1-2 DAYS, DEPENDING ON CLINICAL CONDITION. AND NEURO EVAL   Janny Crute M.D on 12/10/2015 at 3:55 PM  Between 7am to  6pm - Pager - [phone]  After 6pm go to [email or web address] - password [email or web address] [facility]  Office  [phone]  CC: Primary care physician; BABAOFF, Lavada Mesi, MD  Note: This dictation was prepared with Dragon dictation along with smaller phrase technology. Any transcriptional errors that result from this process are unintentional.

## 2015-12-10 NOTE — Progress Notes (Signed)
Dr. Sherryll BurgerShah notified that patient is still extremely agitated/combative, trying to get out of bed, and heart rate is now in the 120's-130s'. Bed alarm on, home health aides at bedside. MD to place orders to give haldol and morphine now. MD aware that this is not patient's baseline per home health aides at bedside.

## 2015-12-10 NOTE — Progress Notes (Signed)
CSW left a voicemail for patient's son Lisa [name]- [phone] requesting a phone call back. CSW will continue to follow and assist.   Woodroe Modehristina Nayel Purdy, MSW, LCSW-A Clinical Social Work Department [phone]

## 2015-12-10 NOTE — Progress Notes (Signed)
PT Cancellation Note  Patient Details Name: Marita SnellenFaye K Danko MRN: [id number] DOB: 04/24/1927   Cancelled Treatment:    Reason Eval/Treat Not Completed: Other (comment) (Consult received and chart reviewed.   Patient noted with order start date of 3/21, but RN cleared for activity this date as tolerated (will update order to reflect).  However, patient preparing to leave unit for MRI and is slightly agitated; RN requests therapist hold until patient returns from MRI.  Will continue to follow and re-attempt as patient available.)   Raia Amico H. Manson PasseyBrown, PT, DPT, NCS 12/10/2015, 1:44 PM [phone]

## 2015-12-10 NOTE — Progress Notes (Addendum)
MRI requesting pre-medicating patient before test as son says she gets very anxious - per Dr. Sherryll BurgerShah, order 1 mg IV ativan once. Also questioned orders for IV fluids with history of CHF - instructed to discontinue fluids per Dr. Sherryll BurgerShah.

## 2015-12-10 NOTE — Progress Notes (Signed)
PT Cancellation Note  Patient Details Name: Lisa SnellenFaye K Fitting MRN: [id number] DOB: 07/02/1927   Cancelled Treatment:    Reason Eval/Treat Not Completed: Medical issues which prohibited therapy (Patient noted with resting HR in 140s at this time; contraindicated for physical therapy at this time.  Will re-attempt next date as medically appropriate.)   Kale Dols H. Manson PasseyBrown, PT, DPT, NCS 12/10/2015, 3:24 PM [phone]

## 2015-12-10 NOTE — Progress Notes (Signed)
Patient has been seen by SLP and now has a diet order, however she is refusing all meds at this time. Will continue to try.

## 2015-12-10 NOTE — Progress Notes (Signed)
Patient now sleeping quietly. Bed alarm on, bed rails up x2, caregiver at bedside. Heart rate now in the 80's. Will continue to monitor.

## 2015-12-10 NOTE — Evaluation (Signed)
Occupational Therapy Evaluation Patient Details Name: Lisa [name] MRN: [id number] DOB: [birth year] Today's Date: 12/10/2015    History of Present Illness Lisa [name] is a 82 y.o. female with a known history of spinal stenosis, osteoarthritis, hypertension, ventricular tachycardia, congestive heart failure, renal disorder, cerebral Lisa [name] is a 82 y.o. female with a known history of spinal stenosis, osteoarthritis, hypertension, ventricular tachycardia, congestive heart failure, renal disorder, cerebral hemorrhage in the past presented to the emergency room with headache. Patient's headache started this afternoon was throbbing in nature. It was 6 out of 10 on a scale of 1-10 .patient's caregiver was in the room and she gave information. Patient has 24 hours/7 days caregiver services. Hemorrhage in the past presented to the emergency room with headache. Patient's headache started this afternoon was throbbing in nature. It was 6 out of 10 on a scale of 1-10 .patient's caregiver was in the room and she gave information. Patient has 24 hours/7 days caregiver services.    Clinical Impression   Pt. Is an 82 y.o. Female who was admitted to [facility] with a headache. Pt. previously had 24 hour caregivers. Pt. presents with a change in mental status, confusion, limited ability to appropriately follow simple one directions consistently for light ADL tasks and BUE assessment. Pt. LUE strength is 3+/5 overall, RUE strength was difficult to assess as pt. continually attempted to remove IV and gown. Pt. was able to complete light grooming tasks with SBA-MinA with heavy cues. Pt. could benefit from continued skilled OT services to improve ADL and IADL functioning and assist in returning to her PLOF.      Follow Up Recommendations  SNF    Equipment Recommendations       Recommendations for Other Services PT consult     Precautions / Restrictions Precautions Precautions: Fall      Mobility Bed Mobility                  Transfers                      Balance                                            ADL Overall ADL's : Needs assistance/impaired Eating/Feeding: Independent                                     General ADL Comments: Pt. was able to complete light simple one step grooming tasks with SBA to MinA and set-up, however required extensive cues for redirection secondary to confusion. Pt. with attempts to remove IVs, and gown during treatment.      Vision     Perception     Praxis      Pertinent Vitals/Pain Pain Assessment: 0-10 Pain Location: Pt. reports having pain, however was unable to specify where pain was or how much pain she was having.     Hand Dominance Left   Extremity/Trunk Assessment             Communication     Cognition Arousal/Alertness: Awake/alert Behavior During Therapy: Anxious Overall Cognitive Status: Impaired/Different from baseline Area of Impairment: Safety/judgement;Orientation;Attention;Memory         Safety/Judgement: Decreased awareness of safety         General Comments  Exercises       Shoulder Instructions      Home Living Family/patient expects to be discharged to:: Private residence Living Arrangements: Alone Available Help at Discharge: Available 24 hours/day Type of Home: House Home Access: Stairs to enter Entergy CorporationEntrance Stairs-Number of Steps: 3 Entrance Stairs-Rails: Left Home Layout: Two level;Able to live on main level with bedroom/bathroom               Home Equipment: Walker - 2 wheels   Additional Comments: has built in shower chair      Prior Functioning/Environment Level of Independence: Needs assistance  Gait / Transfers Assistance Needed: Pt. has 24 hour caregiver assistance whenstanding secondary to being a fall risk. ADL's / Homemaking Assistance Needed: Per pt. caregiver, This pt. was able to prepare a simple light meal for herself,  shower, and perform light self-care tasks. Pt. required assistance for tasks in standing because she is a fall risk.    Comments: Pt. is confused today. Pt. was attempting to pull her gown off, and pull at her IVs.     OT Diagnosis: Generalized weakness;Altered mental status   OT Problem List: Decreased strength;Decreased cognition;Decreased activity tolerance;Decreased safety awareness   OT Treatment/Interventions: Self-care/ADL training;Therapeutic exercise;Neuromuscular education;Therapeutic activities;Patient/family education    OT Goals(Current goals can be found in the care plan section) Acute Rehab OT Goals OT Goal Formulation: Patient unable to participate in goal setting Potential to Achieve Goals: Fair  OT Frequency: Min 1X/week   Barriers to D/C:            Co-evaluation              End of Session    Activity Tolerance:   Patient left: in bed;with call bell/phone within reach;with nursing/sitter in room   Time: 0905-0930 OT Time Calculation (min): 25 min Charges:  OT General Charges $OT Visit: 1 Procedure OT Evaluation $OT Eval Low Complexity: 1 Procedure G-Codes: OT G-codes **NOT FOR INPATIENT CLASS** Functional Assessment Tool Used: Clinical judgement based on pt. current functional status. Functional Limitation: Self care Self Care Current Status [phone]([id number]): At least 60 percent but less than 80 percent impaired, limited or restricted Self Care Goal Status ([id number]): At least 20 percent but less than 40 percent impaired, limited or restricted   Lisa MessierElaine Shenee Wignall, MS, OTR/L  Lisa Messierlaine Ionna Avis 12/10/2015, 10:17 AM

## 2015-12-10 NOTE — Progress Notes (Signed)
Dr. Sherryll BurgerShah notified that patient is starting to calm down, heart rate is slowly coming down to the 110's. We were able to get a urine specimen - order to send for urinalysis.

## 2015-12-10 NOTE — Evaluation (Signed)
Clinical/Bedside Swallow Evaluation Patient Details  Name: Lisa [name] MRN: [id number] Date of Birth: Aug 06, 1927  Today's Date: 12/10/2015 Time: SLP Start Time (ACUTE ONLY): 1150 SLP Stop Time (ACUTE ONLY): 1250 SLP Time Calculation (min) (ACUTE ONLY): 60 min  Past Medical History:  Past Medical History  Diagnosis Date  . Allergy   . Pelvis fracture (HCC)   . Wrist fracture, right   . Depression   . Spinal stenosis   . Osteoarthritis   . Hypertension   . Foot fracture     bilateral  . Brain bleed (HCC)   . V-tach (HCC)   . CHF (congestive heart failure) (HCC)   . Renal disorder    Past Surgical History:  Past Surgical History  Procedure Laterality Date  . Back surgery N/A   . Breast lumpectomy N/A    HPI:  Pt is a 82 y.o. female with a known history of Dementia(per Neurology note), spinal stenosis, osteoarthritis, hypertension, ventricular tachycardia, congestive heart failure, renal disorder, cerebral hemorrhage in the past presented to the emergency room with headache. Patient's headache started this afternoon was throbbing in nature. It was 6 out of 10 on a scale of 1-10 .patient's caregiver was in the room and she gave information. Patient has 24 hours/7 days caregiver services. She walks with a walker and is dependent on activities of daily living. According to the care of your patient also had a some numbness in the left side of the face she also had some slurred speech which occurred late this afternoon. Patient was trying to get out of the bed in the emergency room she was given IV Ativan and IM Haldol. Patient is lethargic and sleepy and unable to give any history. Most of the information above was obtained from the patient's caregiver was at the bedside. Patient was worked up with a CT head in the emergency room which showed no acute intracranial abnormality. During eval, pt was agitated and moving about in bed wanting to get up(NSG had recently give Ativan). Pt required mod.  cues to attend to task of taking po's and required some support w/ feeding self sec. to mitts though pt attempted doing it herself. Pt was oriented to self only; verbally conversive but confused.    Assessment / Plan / Recommendation Clinical Impression  Pt appeared to adequately tolerate trials of thin liquids and purees w/ no immediate, overt s/s of aspiration noted; no decline in respiratory status and vocal quality was clear post trials. Pt was min. shaky and impulsive when holding the cup and required min. support; also given cues to attend to task of po trials/intake. When increaed support was given and verbal cues to follow aspiration precautions was given, pt tolerated po trials appropriately. Oral phase was wfl for trial consistencies assessed. No solids were assessed at this eval sec. to pt's overall Cognitive status currently. Pt appears at reduced risk for aspiration w/ general aspiration precautions in place and able to tolerate a Dys. 1 w/ thin liquids. Support and cues w/ feeding appear nec. Rec. initiation of diet w/ strict aspiration precautions; meds in puree. Pt would benefit from ST services for diet toleration and modification as necessary as part of her ADLs.    Aspiration Risk   (reduced following aspiration precautions)    Diet Recommendation  Dys. 1 w/ thin liquids; aspiration precautions; feeding assistance at meals; reduce distractions at meals. Dietician f/u for supplement support.  Medication Administration: Crushed with puree (as able and nec. )  Other  Recommendations Recommended Consults:  (Dietician ) Oral Care Recommendations: Oral care BID;Staff/trained caregiver to provide oral care   Follow up Recommendations  Skilled Nursing facility (TBD)    Frequency and Duration min 3x week  2 weeks       Prognosis Prognosis for Safe Diet Advancement: Good Barriers to Reach Goals: Cognitive deficits      Swallow Study   General Date of Onset: 12/09/15 HPI: Pt  is a 82 y.o. female with a known history of Dementia(per Neurology note), spinal stenosis, osteoarthritis, hypertension, ventricular tachycardia, congestive heart failure, renal disorder, cerebral hemorrhage in the past presented to the emergency room with headache. Patient's headache started this afternoon was throbbing in nature. It was 6 out of 10 on a scale of 1-10 .patient's caregiver was in the room and she gave information. Patient has 24 hours/7 days caregiver services. She walks with a walker and is dependent on activities of daily living. According to the care of your patient also had a some numbness in the left side of the face she also had some slurred speech which occurred late this afternoon. Patient was trying to get out of the bed in the emergency room she was given IV Ativan and IM Haldol. Patient is lethargic and sleepy and unable to give any history. Most of the information above was obtained from the patient's caregiver was at the bedside. Patient was worked up with a CT head in the emergency room which showed no acute intracranial abnormality. During eval, pt was agitated and moving about in bed wanting to get up(NSG had recently give Ativan). Pt required mod. cues to attend to task of taking po's and required some support w/ feeding self sec. to mitts though pt attempted doing it herself. Pt was oriented to self only; verbally conversive but confused.  Type of Study: Bedside Swallow Evaluation Previous Swallow Assessment: none indicated Diet Prior to this Study: Regular;Thin liquids (unknown?) Temperature Spikes Noted: No (98-99; wbc not elevated(8.5)) Respiratory Status: Room air History of Recent Intubation: No Behavior/Cognition: Pleasant mood;Confused;Agitated;Impulsive;Distractible;Requires cueing;Doesn't follow directions (awake) Oral Cavity Assessment: Within Functional Limits Oral Care Completed by SLP: Recent completion by staff Oral Cavity - Dentition: Adequate natural  dentition Vision: Functional for self-feeding Self-Feeding Abilities: Needs assist;Needs set up;Total assist (d/t confusion and wearing mitts) Patient Positioning: Upright in bed (though she kept moving down in the bed) Baseline Vocal Quality: Normal Volitional Cough: Cognitively unable to elicit Volitional Swallow: Unable to elicit    Oral/Motor/Sensory Function Overall Oral Motor/Sensory Function: Within functional limits (limited assessment sec. to Cognitive status)   Ice Chips Ice chips: Within functional limits Presentation: Spoon (fed; 3 trials)   Thin Liquid Thin Liquid: Within functional limits Presentation: Cup;Self Fed;Straw (assisted; ~3+ ozs) Other Comments: impulsive; reduced awareness    Nectar Thick Nectar Thick Liquid: Not tested   Honey Thick Honey Thick Liquid: Not tested   Puree Puree: Within functional limits Presentation: Spoon (fed; attempted to self feed) Other Comments: 8-10 trials   Solid   GO   Solid: Not tested Other Comments: sec. to her Cognitive status and agitation; Ativan    Functional Assessment Tool Used: clinical judgement  Functional Limitations: Swallowing Swallow Current Status ([id number]): At least 20 percent but less than 40 percent impaired, limited or restricted Swallow Goal Status [phone]): At least 1 percent but less than 20 percent impaired, limited or restricted Swallow Discharge Status [phone]): At least 1 percent but less than 20 percent impaired, limited or restricted  Lisa SomKatherine Anagha Loseke, MS, CCC-SLP  Delaina Fetsch 12/10/2015,2:48 PM

## 2015-12-10 NOTE — Consult Note (Signed)
Referring Physician: Sherryll Burger    Chief Complaint: Headache, slurred speech  HPI: Lisa [name] is an 82 y.o. female unable to provide any history due to her baseline dementia who presented on yesterday with headache.  She does not complain of that at this time but this complaint has been intermittent for some time.  Headache was throbbing in nature. It was 6 out of 10 on a scale of 1-10 . According to the care of your patient also had a some numbness in the left side of the face she also had some slurred speech which occurred late yesterday afternoon.    At baseline patient requires 24 hours/7 days caregiver services. She walks with a walker and is dependent for activities of daily living.  Initial NIHSS of 5.    Date last known well: 12/09/2015 Time last known well: Time: 15:00 tPA Given: No: Outside time window           Modified Rankin: Rankin Score=4  Past Medical History  Diagnosis Date  . Allergy   . Pelvis fracture (HCC)   . Wrist fracture, right   . Depression   . Spinal stenosis   . Osteoarthritis   . Hypertension   . Foot fracture     bilateral  . Brain bleed (HCC)   . V-tach (HCC)   . CHF (congestive heart failure) (HCC)   . Renal disorder     Past Surgical History  Procedure Laterality Date  . Back surgery N/A   . Breast lumpectomy N/A     Family History  Problem Relation Age of Onset  . COPD Mother   . COPD Father   . Heart disease Father    Social History:  reports that she has never smoked. She has never used smokeless tobacco. She reports that she does not drink alcohol or use illicit drugs.  Allergies:  Allergies  Allergen Reactions  . Ambien [Zolpidem Tartrate] Other (See Comments)    Reaction:  Hallucinations   . Daypro [Oxaprozin] Other (See Comments)    GI upset  . Oxycodone Nausea And Vomiting  . Shellfish Allergy Swelling and Other (See Comments)    Pts mouth and face swells.   . Ace Inhibitors Cough  . Norvasc [Amlodipine Besylate]  Palpitations    Medications:  I have reviewed the patient's current medications. Prior to Admission:  Prescriptions prior to admission  Medication Sig Dispense Refill Last Dose  . acetaminophen (TYLENOL) 325 MG tablet Take 1-2 tablets (325-650 mg total) by mouth every 4 (four) hours as needed for mild pain.   PRN at PRN  . albuterol (PROVENTIL HFA;VENTOLIN HFA) 108 (90 Base) MCG/ACT inhaler Inhale 2 puffs into the lungs every 4 (four) hours as needed for wheezing or shortness of breath (cough). 1 Inhaler 0 PRN at PRN  . citalopram (CELEXA) 20 MG tablet Take 1 tablet (20 mg total) by mouth daily. 30 tablet 1 unknown at unknown  . ELIQUIS 5 MG TABS tablet Take 1 tablet by mouth 2 (two) times daily. Reported on 11/08/2015  0 unknown at unknown  . feeding supplement, ENSURE ENLIVE, (ENSURE ENLIVE) LIQD Take 237 mLs by mouth 3 (three) times daily between meals. 237 mL 12 unknown at unknown  . guaiFENesin-dextromethorphan (ROBITUSSIN DM) 100-10 MG/5ML syrup Take 5 mLs by mouth every 4 (four) hours as needed for cough. 118 mL 0 PRN at PRN  . HYDROcodone-acetaminophen (NORCO/VICODIN) 5-325 MG tablet Take 1 tablet by mouth every 6 (six) hours as needed for moderate  pain. 15 tablet 0 PRN at PRN  . irbesartan (AVAPRO) 300 MG tablet Take 150 mg by mouth daily.  3 unknown at unknown  . metoprolol tartrate (LOPRESSOR) 25 MG tablet Take 0.5 tablets (12.5 mg total) by mouth 2 (two) times daily with a meal. 60 tablet 0 unknown at unknown  . mirtazapine (REMERON) 15 MG tablet Take 1 tablet by mouth daily.  0 unknown at unknown  . omeprazole (PRILOSEC) 20 MG capsule Take 1 capsule by mouth daily.  10 unknown at unknown  . pantoprazole (PROTONIX) 20 MG tablet Take 1 tablet (20 mg total) by mouth 2 (two) times daily.   unknown at unknown  . potassium chloride SA (K-DUR,KLOR-CON) 20 MEQ tablet Take 1 tablet (20 mEq total) by mouth daily.   unknown at unknown  . QUEtiapine (SEROQUEL) 50 MG tablet Take 1 tablet (50 mg  total) by mouth at bedtime. 7 tablet 0 unknown at unknown  . raloxifene (EVISTA) 60 MG tablet Take 1 tablet by mouth daily.   unknown at unknown  . senna-docusate (SENOKOT-S) 8.6-50 MG tablet Take 2 tablets by mouth 2 (two) times daily.   unknown at unknown  . oseltamivir (TAMIFLU) 30 MG capsule Take 1 capsule (30 mg total) by mouth daily. (Patient not taking: Reported on 12/09/2015) 5 capsule 0 Completed Course at Unknown time   Scheduled: . apixaban  5 mg Oral BID  . aspirin  300 mg Rectal Daily   Or  . aspirin  325 mg Oral Daily  . citalopram  20 mg Oral Daily  . feeding supplement (ENSURE ENLIVE)  237 mL Oral TID BM  . irbesartan  150 mg Oral Daily  . mirtazapine  15 mg Oral Daily  . pantoprazole  40 mg Oral Daily  . potassium chloride SA  20 mEq Oral Daily  . QUEtiapine  50 mg Oral STAT  . QUEtiapine  50 mg Oral QHS  . raloxifene  60 mg Oral Daily  . senna-docusate  2 tablet Oral BID    ROS: History obtained from the patient  General ROS: negative for - chills, fatigue, fever, night sweats, weight gain or weight loss Psychological ROS: negative for - behavioral disorder, hallucinations, memory difficulties, mood swings or suicidal ideation Ophthalmic ROS: negative for - blurry vision, double vision, eye pain or loss of vision ENT ROS: negative for - epistaxis, nasal discharge, oral lesions, sore throat, tinnitus or vertigo Allergy and Immunology ROS: negative for - hives or itchy/watery eyes Hematological and Lymphatic ROS: negative for - bleeding problems, bruising or swollen lymph nodes Endocrine ROS: negative for - galactorrhea, hair pattern changes, polydipsia/polyuria or temperature intolerance Respiratory ROS: negative for - cough, hemoptysis, shortness of breath or wheezing Cardiovascular ROS: negative for - chest pain, dyspnea on exertion, edema or irregular heartbeat Gastrointestinal ROS: negative for - abdominal pain, diarrhea, hematemesis, nausea/vomiting or stool  incontinence Genito-Urinary ROS: negative for - dysuria, hematuria, incontinence or urinary frequency/urgency Musculoskeletal ROS: negative for - joint swelling or muscular weakness Neurological ROS: as noted in HPI Dermatological ROS: negative for rash and skin lesion changes  Physical Examination: Blood pressure 158/63, pulse 82, temperature 99 F (37.2 C), temperature source Oral, resp. rate 24, height  (1.575 m), weight 68.357 kg (150 lb 11.2 oz), SpO2 98 %.  HEENT-  Normocephalic, no lesions, without obvious abnormality.  Normal external eye and conjunctiva.  Normal TM's bilaterally.  Normal auditory canals and external ears. Normal external nose, mucus membranes and septum.  Normal pharynx. Cardiovascular-  S1, S2 normal, pulses palpable throughout   Lungs- chest clear, no wheezing, rales, normal symmetric air entry Abdomen- soft, non-tender; bowel sounds normal; no masses,  no organomegaly Extremities- no edema Lymph-no adenopathy palpable Musculoskeletal-no joint tenderness, deformity or swelling Skin-warm and dry, no hyperpigmentation, vitiligo, or suspicious lesions  Neurological Examination Mental Status: Alert.  Not oriented.  Can follow some simple commands but has difficulty with even some simple commands.  Speech is mildly dysarthric with notable word substitutions and paraphasic errors.   Cranial Nerves: II: Discs flat bilaterally; Visual fields grossly normal, pupils equal, round, reactive to light and accommodation III,IV, VI: ptosis not present, extra-ocular motions intact bilaterally V,VII: decreased right NLF, facial light touch sensation normal bilaterally VIII: hearing normal bilaterally IX,X: gag reflex present XI: bilateral shoulder shrug XII: midline tongue extension Motor: Able to lift all extremities off the bed with no focal weakness noted.   Sensory: Describes some altered sensation in her RLE that she attributes to an old injury.   Deep Tendon  Reflexes: 2+ in the upper extremities and absent in the lower extremities.   Plantars: Right: mute   Left: mute Cerebellar: Unable to perform finger-to-nose and heel-to-shin testing bilaterally due to mental status Gait: not tested due to safety concerns    Laboratory Studies:  Basic Metabolic Panel:  Recent Labs Lab 12/09/15 2055 12/10/15 0537  NA 138 138  K 4.1 3.8  CL 111 111  CO2 24 24  GLUCOSE 83 82  BUN 15 15  CREATININE 1.46* 1.42*  CALCIUM 7.6* 7.3*    Liver Function Tests:  Recent Labs Lab 12/10/15 0537  AST 29  ALT 11*  ALKPHOS 54  BILITOT 0.6  PROT 5.4*  ALBUMIN 2.8*   No results for input(s): LIPASE, AMYLASE in the last 168 hours. No results for input(s): AMMONIA in the last 168 hours.  CBC:  Recent Labs Lab 12/09/15 2055 12/10/15 0537  WBC 8.9 8.5  HGB 10.6* 9.7*  HCT 33.0* 29.2*  MCV 91.1 90.0  PLT 232 200    Cardiac Enzymes: No results for input(s): CKTOTAL, CKMB, CKMBINDEX, TROPONINI in the last 168 hours.  BNP: Invalid input(s): POCBNP  CBG:  Recent Labs Lab 12/09/15 2244 12/10/15 0353 12/10/15 0757  GLUCAP 87 82 86    Microbiology: Results for orders placed or performed during the hospital encounter of 11/21/15  Rapid Influenza A&B Antigens ([facility] only)     Status: Abnormal   Collection Time: 11/21/15  1:00 AM  Result Value Ref Range Status   Influenza A ([facility]) DETECTED (A) NEGATIVE Final   Influenza B ([facility]) NOT DETECTED (A) NEGATIVE Final  Culture, blood (routine x 2)     Status: None   Collection Time: 11/21/15  1:03 AM  Result Value Ref Range Status   Specimen Description BLOOD  Final   Special Requests NONE  Final   Culture NO GROWTH 5 DAYS  Final   Report Status 11/26/2015 FINAL  Final  Culture, blood (routine x 2)     Status: None   Collection Time: 11/21/15  1:04 AM  Result Value Ref Range Status   Specimen Description BLOOD  Final   Special Requests NONE  Final   Culture NO GROWTH 5 DAYS  Final   Report  Status 11/26/2015 FINAL  Final    Coagulation Studies:  Recent Labs  12/09/15 2055  LABPROT 13.9  INR 1.05    Urinalysis: No results for input(s): COLORURINE, LABSPEC, PHURINE, GLUCOSEU, HGBUR, BILIRUBINUR, KETONESUR, PROTEINUR, UROBILINOGEN,  NITRITE, LEUKOCYTESUR in the last 168 hours.  Invalid input(s): APPERANCEUR  Lipid Panel:    Component Value Date/Time   CHOL 174 12/10/2015 0537   TRIG 115 12/10/2015 0537   HDL 44 12/10/2015 0537   CHOLHDL 4.0 12/10/2015 0537   VLDL 23 12/10/2015 0537   LDLCALC 107* 12/10/2015 0537    HgbA1C:  Lab Results  Component Value Date   HGBA1C 5.9* 08/23/2015    Urine Drug Screen:  No results found for: LABOPIA, COCAINSCRNUR, LABBENZ, AMPHETMU, THCU, LABBARB  Alcohol Level: No results for input(s): ETH in the last 168 hours.  Other results: EKG: sinus rhythm at 83 bpm, LBBB.  Imaging: Ct Head Wo Contrast  12/09/2015  CLINICAL DATA:  Severe headache at 4 p.m. today. History of head injury with intracranial hemorrhage on 11/16. EXAM: CT HEAD WITHOUT CONTRAST TECHNIQUE: Contiguous axial images were obtained from the base of the skull through the vertex without intravenous contrast. COMPARISON:  08/30/2015 FINDINGS: Mild diffuse cerebral atrophy. Mild ventricular dilatation consistent with central atrophy. Low-attenuation changes in the deep white matter consistent with small vessel ischemia. Low-attenuation areas in both anterior frontal lobes consistent with old areas of encephalomalacia likely posttraumatic or ischemic. These areas have become better defined since previous study suggesting typical evolution of previous acute change. No mass effect or midline shift. No sulcal effacement. Gray-white matter junctions are distinct. Basal cisterns are not effaced. No abnormal extra-axial fluid collections. No acute intracranial hemorrhage. Calvarium appears intact. Air-fluid levels in the maxillary antra bilaterally may indicate sinusitis. Mastoid  air cells are not opacified. Vascular calcifications. IMPRESSION: No acute intracranial abnormalities. Chronic atrophy and small vessel ischemic changes bilaterally with old encephalomalacia in the anterior frontal lobes. Air-fluid levels in the paranasal sinuses may indicate acute sinusitis. Electronically Signed   By: Burman Nieves M.D.   On: 12/09/2015 20:38    Assessment: 82 y.o. female presenting with headache, difficulty with speech and left facial numbness.  The numbness and headache have resolved.  Speech deficits continue.  Head CT personally reiviewed and shows no acute changes.  Suspect a small MCA branch infarct.  Patient on Eliquis.  Further work up recommended.   Echocardiogram performed 2/27 shows EF of 35-40%. LDL 107.    Stroke Risk Factors - hypertension  Plan: 1. HgbA1c 2. MRI, MRA  of the brain without contrast 3. PT consult, OT consult, Speech consult 4. Frequent neuro checks 5. Carotid dopplers 6. Prophylactic therapy-Continue Eliquis 7. Statin for lipid management 8. Telemetry monitoring    Thana Farr, MD Neurology [phone] 12/10/2015, 11:31 AM

## 2015-12-10 NOTE — Progress Notes (Signed)
Dr. Sherryll BurgerShah notified that patient is getting agitated, pulling at everything. Sitter at bedside unable to distract and calm down. MD to place orders for ativan.

## 2015-12-10 NOTE — Progress Notes (Signed)
Dr. Sherryll BurgerShah notified that patient is still very agitated, not sitting still long enough to even get the bed out of the room to take her down to MRI. Instructed to give another 1mg  of ativan and see how she does and give one more mg after that if needed (total of 3 mg this instance).

## 2015-12-10 NOTE — H&P (Addendum)
[facility] - [place] at [facility]   PATIENT NAME: Lisa [name]    MR#:  [id number]  DATE OF BIRTH:  1926/09/28  DATE OF ADMISSION:  12/09/2015  PRIMARY CARE PHYSICIAN: BABAOFF, Lavada Mesi, MD   REQUESTING/REFERRING PHYSICIAN:   CHIEF COMPLAINT:   Chief Complaint  Patient presents with  . Headache    HISTORY OF PRESENT ILLNESS: Lisa [name]  is a 82 y.o. female with a known history of spinal stenosis, osteoarthritis, hypertension, ventricular tachycardia, congestive heart failure, renal disorder, cerebral hemorrhage in the past presented to the emergency room with headache. Patient's headache started this afternoon was throbbing in nature. It was 6 out of 10 on a scale of 1-10 .patient's caregiver was in the room and she gave information. Patient has 24 hours/7 days caregiver services. She walks with a walker and is dependent on activities of daily living. According to the care of your patient also had a some numbness in the left side of the face she also had some slurred speech which occurred late this afternoon. Patient was trying to get out of the bed in the emergency room she was given IV Ativan and IM Haldol. Patient is lethargic and sleepy and unable to give any history. Most of the information above was obtained from the patient's caregiver was at the bedside. Patient was worked up with a CT head in the emergency room which showed no acute intracranial abnormality.  PAST MEDICAL HISTORY:   Past Medical History  Diagnosis Date  . Allergy   . Pelvis fracture (HCC)   . Wrist fracture, right   . Depression   . Spinal stenosis   . Osteoarthritis   . Hypertension   . Foot fracture     bilateral  . Brain bleed (HCC)   . V-tach (HCC)   . CHF (congestive heart failure) (HCC)   . Renal disorder     PAST SURGICAL HISTORY: Past Surgical History  Procedure Laterality Date  . Back surgery N/A   . Breast lumpectomy N/A     SOCIAL HISTORY:  Social History   Substance Use Topics  . Smoking status: Never Smoker   . Smokeless tobacco: Never Used  . Alcohol Use: No    FAMILY HISTORY:  Family History  Problem Relation Age of Onset  . COPD Mother   . COPD Father   . Heart disease Father     DRUG ALLERGIES:  Allergies  Allergen Reactions  . Ambien [Zolpidem Tartrate] Other (See Comments)    Reaction:  Hallucinations   . Daypro [Oxaprozin] Other (See Comments)    GI upset  . Oxycodone Nausea And Vomiting  . Shellfish Allergy Swelling and Other (See Comments)    Pts mouth and face swells.   . Ace Inhibitors Cough  . Norvasc [Amlodipine Besylate] Palpitations    REVIEW OF SYSTEMS:  Could not be obtained, patient lethargic and sleepy. MEDICATIONS AT HOME:  Prior to Admission medications   Medication Sig Start Date End Date Taking? Authorizing Provider  acetaminophen (TYLENOL) 325 MG tablet Take 1-2 tablets (325-650 mg total) by mouth every 4 (four) hours as needed for mild pain. 09/12/15  Yes Evlyn Kanner Love, PA-C  albuterol (PROVENTIL HFA;VENTOLIN HFA) 108 (90 Base) MCG/ACT inhaler Inhale 2 puffs into the lungs every 4 (four) hours as needed for wheezing or shortness of breath (cough). 10/10/15  Yes Richardean Canal, MD  citalopram (CELEXA) 20 MG tablet Take 1 tablet (20 mg total) by mouth daily. 08/22/15  Yes Richarda OverlieNayana Abrol, MD  ELIQUIS 5 MG TABS tablet Take 1 tablet by mouth 2 (two) times daily. Reported on 11/08/2015 10/04/15  Yes Historical Provider, MD  feeding supplement, ENSURE ENLIVE, (ENSURE ENLIVE) LIQD Take 237 mLs by mouth 3 (three) times daily between meals. 09/12/15  Yes Evlyn KannerPamela S Love, PA-C  guaiFENesin-dextromethorphan (ROBITUSSIN DM) 100-10 MG/5ML syrup Take 5 mLs by mouth every 4 (four) hours as needed for cough. 11/21/15  Yes Wyatt Hasteavid K Hower, MD  HYDROcodone-acetaminophen (NORCO/VICODIN) 5-325 MG tablet Take 1 tablet by mouth every 6 (six) hours as needed for moderate pain. 09/12/15  Yes Evlyn KannerPamela S Love, PA-C  irbesartan (AVAPRO) 300 MG  tablet Take 150 mg by mouth daily. 10/05/15  Yes Historical Provider, MD  metoprolol tartrate (LOPRESSOR) 25 MG tablet Take 0.5 tablets (12.5 mg total) by mouth 2 (two) times daily with a meal. 08/22/15  Yes Richarda OverlieNayana Abrol, MD  mirtazapine (REMERON) 15 MG tablet Take 1 tablet by mouth daily. 10/04/15  Yes Historical Provider, MD  omeprazole (PRILOSEC) 20 MG capsule Take 1 capsule by mouth daily. 10/05/15  Yes Historical Provider, MD  pantoprazole (PROTONIX) 20 MG tablet Take 1 tablet (20 mg total) by mouth 2 (two) times daily. 09/12/15  Yes Evlyn KannerPamela S Love, PA-C  potassium chloride SA (K-DUR,KLOR-CON) 20 MEQ tablet Take 1 tablet (20 mEq total) by mouth daily. 09/12/15  Yes Evlyn KannerPamela S Love, PA-C  QUEtiapine (SEROQUEL) 50 MG tablet Take 1 tablet (50 mg total) by mouth at bedtime. 09/12/15  Yes Evlyn KannerPamela S Love, PA-C  raloxifene (EVISTA) 60 MG tablet Take 1 tablet by mouth daily. 10/05/15  Yes Historical Provider, MD  senna-docusate (SENOKOT-S) 8.6-50 MG tablet Take 2 tablets by mouth 2 (two) times daily. 09/12/15  Yes Evlyn KannerPamela S Love, PA-C  oseltamivir (TAMIFLU) 30 MG capsule Take 1 capsule (30 mg total) by mouth daily. Patient not taking: Reported on 12/09/2015 11/21/15   Wyatt Hasteavid K Hower, MD      PHYSICAL EXAMINATION:   VITAL SIGNS: Blood pressure 180/88, pulse 80, temperature 98 F (36.7 C), temperature source Oral, resp. rate 18, height 5\' 2"  (1.575 m), weight 65.772 kg (145 lb), SpO2 100 %.  GENERAL:  82 y.o.-year-old patient lying in the bed with no acute distress.  EYES: Pupils equal, round, reactive to light and accommodation. No scleral icterus. Extraocular muscles intact.  HEENT: Head atraumatic, normocephalic. Oropharynx and nasopharynx clear.  NECK:  Supple, no jugular venous distention. No thyroid enlargement, no tenderness.  LUNGS: Normal breath sounds bilaterally, no wheezing, rales,rhonchi or crepitation. No use of accessory muscles of respiration.  CARDIOVASCULAR: S1, S2 normal. No murmurs, rubs,  or gallops.  ABDOMEN: Soft, nontender, nondistended. Bowel sounds present. No organomegaly or mass.  EXTREMITIES: No pedal edema, cyanosis, or clubbing.  NEUROLOGIC: Lethargic, sedated, moves all extremities.No cerebellar signs noted.Gait could not be assessed. PSYCHIATRIC: could not be assessed SKIN: No obvious rash, lesion, or ulcer.   LABORATORY PANEL:   CBC  Recent Labs Lab 12/09/15 2055  WBC 8.9  HGB 10.6*  HCT 33.0*  PLT 232  MCV 91.1  MCH 29.3  MCHC 32.2  RDW 15.9*   ------------------------------------------------------------------------------------------------------------------  Chemistries   Recent Labs Lab 12/09/15 2055  NA 138  K 4.1  CL 111  CO2 24  GLUCOSE 83  BUN 15  CREATININE 1.46*  CALCIUM 7.6*   ------------------------------------------------------------------------------------------------------------------ estimated creatinine clearance is 24.2 mL/min (by C-G formula based on Cr of 1.46). ------------------------------------------------------------------------------------------------------------------ No results for input(s): TSH, T4TOTAL, T3FREE, THYROIDAB in the last  72 hours.  Invalid input(s): FREET3   Coagulation profile  Recent Labs Lab 12/09/15 2055  INR 1.05   ------------------------------------------------------------------------------------------------------------------- No results for input(s): DDIMER in the last 72 hours. -------------------------------------------------------------------------------------------------------------------  Cardiac Enzymes No results for input(s): CKMB, TROPONINI, MYOGLOBIN in the last 168 hours.  Invalid input(s): CK ------------------------------------------------------------------------------------------------------------------ Invalid input(s): POCBNP  ---------------------------------------------------------------------------------------------------------------  Urinalysis     Component Value Date/Time   COLORURINE YELLOW* 10/02/2015 1900   COLORURINE Yellow 10/31/2013 1259   APPEARANCEUR CLEAR* 10/02/2015 1900   APPEARANCEUR Hazy 10/31/2013 1259   LABSPEC 1.011 10/02/2015 1900   LABSPEC 1.017 10/31/2013 1259   PHURINE 5.0 10/02/2015 1900   PHURINE 5.0 10/31/2013 1259   GLUCOSEU NEGATIVE 10/02/2015 1900   GLUCOSEU Negative 10/31/2013 1259   HGBUR NEGATIVE 10/02/2015 1900   HGBUR Negative 10/31/2013 1259   BILIRUBINUR NEGATIVE 10/02/2015 1900   BILIRUBINUR Negative 10/31/2013 1259   KETONESUR NEGATIVE 10/02/2015 1900   KETONESUR Trace 10/31/2013 1259   PROTEINUR NEGATIVE 10/02/2015 1900   PROTEINUR Negative 10/31/2013 1259   UROBILINOGEN 0.2 02/21/2010 1109   NITRITE NEGATIVE 10/02/2015 1900   NITRITE Negative 10/31/2013 1259   LEUKOCYTESUR TRACE* 10/02/2015 1900   LEUKOCYTESUR 3+ 10/31/2013 1259     RADIOLOGY: Ct Head Wo Contrast  12/09/2015  CLINICAL DATA:  Severe headache at 4 p.m. today. History of head injury with intracranial hemorrhage on 11/16. EXAM: CT HEAD WITHOUT CONTRAST TECHNIQUE: Contiguous axial images were obtained from the base of the skull through the vertex without intravenous contrast. COMPARISON:  08/30/2015 FINDINGS: Mild diffuse cerebral atrophy. Mild ventricular dilatation consistent with central atrophy. Low-attenuation changes in the deep white matter consistent with small vessel ischemia. Low-attenuation areas in both anterior frontal lobes consistent with old areas of encephalomalacia likely posttraumatic or ischemic. These areas have become better defined since previous study suggesting typical evolution of previous acute change. No mass effect or midline shift. No sulcal effacement. Gray-white matter junctions are distinct. Basal cisterns are not effaced. No abnormal extra-axial fluid collections. No acute intracranial hemorrhage. Calvarium appears intact. Air-fluid levels in the maxillary antra bilaterally may indicate  sinusitis. Mastoid air cells are not opacified. Vascular calcifications. IMPRESSION: No acute intracranial abnormalities. Chronic atrophy and small vessel ischemic changes bilaterally with old encephalomalacia in the anterior frontal lobes. Air-fluid levels in the paranasal sinuses may indicate acute sinusitis. Electronically Signed   By: Burman Nieves M.D.   On: 12/09/2015 20:38    EKG: Orders placed or performed during the hospital encounter of 12/09/15  . ED EKG  . ED EKG  . EKG 12-Lead  . EKG 12-Lead    IMPRESSION AND PLAN: 82 year old elderly female patient with history of hypertension, ventricular tachycardia, congestive heart failure, cerebral hemorrhage resented to the emergency room with headache and the numbness of the left side of the face and difficulty speech. Admitting diagnosis 1. Transient ischemic attack 2. Headache 3. Hypertension 4. Congestive heart failure which is stable  Treatment plan Admit patient to telemetry observation bed MRI brain to rule out CVA Carotid ultrasound to rule out obstruction, check echocardiogram Neurology consultation Aspirin 325 mg daily Oral eliquis to continue, Sequential compression device for DVT prophylaxis PTOT evaluation Supportive care.  All the records are reviewed and case discussed with ED provider. Management plans discussed with the patient, family and they are in agreement.  CODE STATUS:FULL Code Status History    Date Active Date Inactive Code Status Order ID Comments User Context   11/21/2015  5:36 AM 11/21/2015  5:31 PM Full Code  [id number]  Arnaldo Natal, MD Inpatient   08/23/2015  9:04 AM 09/12/2015  6:56 PM Partial Code [id number]  Jacquelynn Cree, PA-C Inpatient   08/22/2015  2:55 PM 08/23/2015  9:03 AM Partial Code [id number]  Jacquelynn Cree, PA-C Inpatient   08/22/2015  2:55 PM 08/22/2015  2:55 PM Full Code [id number]  Jacquelynn Cree, PA-C Inpatient   08/09/2015  9:37 AM 08/22/2015  2:55 PM Partial Code [id number]   [name], MD Inpatient   08/03/2015  7:27 PM 08/09/2015  9:37 AM Full Code [id number]  Coletta Memos, MD Inpatient    Advance Directive Documentation        Most Recent Value   Type of Advance Directive  Healthcare Power of Attorney   Pre-existing out of facility DNR order (yellow form or pink MOST form)     "MOST" Form in Place?         TOTAL TIME TAKING CARE OF THIS PATIENT : 50 minutes.    Ihor Austin M.D on 12/10/2015 at 12:23 AM  Between 7am to 6pm - Pager - [phone]  After 6pm go to [email or web address] - password [email or web address] [facility]  [facility]  Office  [phone]  CC: Primary care physician; BABAOFF, Lavada Mesi, MD

## 2015-12-10 NOTE — ED Notes (Signed)
Patient being re-directed into stretcher bed by this RN and caregiver. MD made aware of patient hyperactive/altered mental status. New orders placed. Will complete accordingly.

## 2015-12-11 ENCOUNTER — Encounter: Payer: Self-pay | Admitting: Physician Assistant

## 2015-12-11 ENCOUNTER — Inpatient Hospital Stay: Payer: Medicare Other

## 2015-12-11 DIAGNOSIS — G459 Transient cerebral ischemic attack, unspecified: Principal | ICD-10-CM

## 2015-12-11 DIAGNOSIS — F329 Major depressive disorder, single episode, unspecified: Secondary | ICD-10-CM | POA: Diagnosis present

## 2015-12-11 DIAGNOSIS — I110 Hypertensive heart disease with heart failure: Secondary | ICD-10-CM | POA: Diagnosis present

## 2015-12-11 DIAGNOSIS — Z888 Allergy status to other drugs, medicaments and biological substances status: Secondary | ICD-10-CM | POA: Diagnosis not present

## 2015-12-11 DIAGNOSIS — Z8249 Family history of ischemic heart disease and other diseases of the circulatory system: Secondary | ICD-10-CM | POA: Diagnosis not present

## 2015-12-11 DIAGNOSIS — Z886 Allergy status to analgesic agent status: Secondary | ICD-10-CM | POA: Diagnosis not present

## 2015-12-11 DIAGNOSIS — F0391 Unspecified dementia with behavioral disturbance: Secondary | ICD-10-CM | POA: Diagnosis not present

## 2015-12-11 DIAGNOSIS — I4891 Unspecified atrial fibrillation: Secondary | ICD-10-CM | POA: Diagnosis present

## 2015-12-11 DIAGNOSIS — I472 Ventricular tachycardia: Secondary | ICD-10-CM | POA: Diagnosis present

## 2015-12-11 DIAGNOSIS — Z7901 Long term (current) use of anticoagulants: Secondary | ICD-10-CM | POA: Diagnosis not present

## 2015-12-11 DIAGNOSIS — I420 Dilated cardiomyopathy: Secondary | ICD-10-CM | POA: Insufficient documentation

## 2015-12-11 DIAGNOSIS — I5022 Chronic systolic (congestive) heart failure: Secondary | ICD-10-CM | POA: Diagnosis present

## 2015-12-11 DIAGNOSIS — Z8674 Personal history of sudden cardiac arrest: Secondary | ICD-10-CM | POA: Diagnosis not present

## 2015-12-11 DIAGNOSIS — R451 Restlessness and agitation: Secondary | ICD-10-CM | POA: Diagnosis not present

## 2015-12-11 DIAGNOSIS — F0390 Unspecified dementia without behavioral disturbance: Secondary | ICD-10-CM | POA: Insufficient documentation

## 2015-12-11 DIAGNOSIS — Z87820 Personal history of traumatic brain injury: Secondary | ICD-10-CM | POA: Diagnosis not present

## 2015-12-11 DIAGNOSIS — R4182 Altered mental status, unspecified: Secondary | ICD-10-CM | POA: Diagnosis not present

## 2015-12-11 DIAGNOSIS — I480 Paroxysmal atrial fibrillation: Secondary | ICD-10-CM | POA: Diagnosis not present

## 2015-12-11 DIAGNOSIS — I491 Atrial premature depolarization: Secondary | ICD-10-CM | POA: Diagnosis not present

## 2015-12-11 DIAGNOSIS — Z86718 Personal history of other venous thrombosis and embolism: Secondary | ICD-10-CM | POA: Diagnosis not present

## 2015-12-11 DIAGNOSIS — N289 Disorder of kidney and ureter, unspecified: Secondary | ICD-10-CM | POA: Diagnosis present

## 2015-12-11 LAB — GLUCOSE, CAPILLARY
Glucose-Capillary: 86 mg/dL (ref 65–99)
Glucose-Capillary: 138 mg/dL — ABNORMAL HIGH (ref 65–99)
Glucose-Capillary: 156 mg/dL — ABNORMAL HIGH (ref 65–99)

## 2015-12-11 LAB — HEMOGLOBIN A1C: Hgb A1c MFr Bld: 5.6 % (ref 4.0–6.0)

## 2015-12-11 MED ORDER — AMIODARONE HCL 200 MG PO TABS
400.0000 mg | ORAL_TABLET | Freq: Two times a day (BID) | ORAL | Status: DC
  Filled 2015-12-11: qty 2

## 2015-12-11 MED ORDER — AMIODARONE HCL 200 MG PO TABS
400.0000 mg | ORAL_TABLET | Freq: Three times a day (TID) | ORAL | Status: AC
  Administered 2015-12-11 (×3): 400 mg via ORAL
  Filled 2015-12-11 (×3): qty 2

## 2015-12-11 MED ORDER — METOPROLOL TARTRATE 25 MG PO TABS
12.5000 mg | ORAL_TABLET | Freq: Two times a day (BID) | ORAL | Status: DC
  Administered 2015-12-11 – 2015-12-12 (×2): 12.5 mg via ORAL
  Filled 2015-12-11 (×2): qty 1

## 2015-12-11 NOTE — Plan of Care (Signed)
Problem: Self-Care: Goal: Ability to communicate needs accurately will improve Outcome: Not Progressing incomprehensible speech, unable to communicate effectively with staff

## 2015-12-11 NOTE — Evaluation (Signed)
Physical Therapy Evaluation Patient Details Name: Lisa [name] MRN: [id number] DOB: [birth year] Today's Date: 12/11/2015   History of Present Illness  presented to ER secondary to HA, slurred speech; admitted with TIA vs. CVA.  Per neuro, suspecting small MCA infarct (MRI pending).  Hospital course complicated by periods of agitation and new-onset Afib with RVR (manged with oral amioderone per cardiology)  Clinical Impression  Upon evaluation, patient alert and oriented to self; difficult to formally assess otherwise due to expressive speech deficits.  Patient with very limited awareness of speech difficulty.  Follows simple, one-step verbal commands with increased time for processing, but does demo mild/mod task perseveration at times.  Bilat UE/LE strength and ROM grossly WFL and symmetrical except R ankle DF 1/5 (baseline from previous CVA per caregiver).  Able to complete bed mobility with mod indep; sit/stand, basic transfers and short-distance gait (25') with RW, min assist for dynamic balance and midline orientation.  Patient slightly restless at times, but pleasant and cooperative with evaluation components.  Will continue to progress mobility as able. Would benefit from skilled PT to address above deficits and promote optimal return to PLOF; Recommend transition to HHPT with continued 24/7 sup/assist from caregivers upon discharge from acute hospitalization.     Follow Up Recommendations Home health PT;Supervision/Assistance - 24 hour    Equipment Recommendations       Recommendations for Other Services       Precautions / Restrictions Precautions Precautions: Fall Restrictions Weight Bearing Restrictions: No      Mobility  Bed Mobility Overal bed mobility: Modified Independent                Transfers Overall transfer level: Needs assistance Equipment used: Rolling walker (2 wheeled) Transfers: Sit to/from Stand Sit to Stand: Min assist             Ambulation/Gait Ambulation/Gait assistance: Min assist Ambulation Distance (Feet): 25 Feet Assistive device: Rolling walker (2 wheeled)       General Gait Details: partially reciprocal stepping pattern with decreased R heel strike, foot clearance; mild R lateral lean at times requiring min assist for L ant/lateral shift to maintain midline with gait efforts  Stairs            Wheelchair Mobility    Modified Rankin (Stroke Patients Only)       Balance Overall balance assessment: Needs assistance Sitting-balance support: No upper extremity supported;Feet supported Sitting balance-Leahy Scale: Fair     Standing balance support: Bilateral upper extremity supported Standing balance-Leahy Scale: Fair                               Pertinent Vitals/Pain Pain Assessment: Faces Pain Score: 0-No pain Pain Location: no clinical indicators of pain noted    Home Living Family/patient expects to be discharged to:: Private residence Living Arrangements: Alone Available Help at Discharge: Personal care attendant;Available 24 hours/day Type of Home: House Home Access: Stairs to enter Entrance Stairs-Rails: Left Entrance Stairs-Number of Steps: 3 Home Layout: Two level;Able to live on main level with bedroom/bathroom        Prior Function Level of Independence: Needs assistance         Comments: Sup/mod indep for ADLs and basic household mobility with RW; does have caregiver 24/7 that assists with household chores, errands, community needs as appropriate.  Endorse single fall within previous 4 months.     Hand Dominance  Extremity/Trunk Assessment   Upper Extremity Assessment: Overall WFL for tasks assessed           Lower Extremity Assessment: Overall WFL for tasks assessed (grossly at least 3+ to 4/5 throughout except R ankle DF (1/5, baseline per caregiver))      Cervical / Trunk Assessment:  (mild elongation of R lateral trunk with  excessive weight shift to R IT in sitting)  Communication   Communication: Expressive difficulties (follows simple, one-step verbal commands with increased time for processing (optimized with use of gestures for return demonstration))  Cognition Arousal/Alertness: Awake/alert Behavior During Therapy: Restless Overall Cognitive Status: Impaired/Different from baseline                 General Comments: impulsive, poor insight into deficits    General Comments      Exercises Other Exercises Other Exercises: Unsupported sitting balance, close sup/min assist--R lateral lean/listing with fatigue or divided attention requiring min cuing from therapist for correction. Other Exercises: Unable to verbally name 2/2 objects, but able to appropriately demonstrate use to therapist.      Assessment/Plan    PT Assessment Patient needs continued PT services  PT Diagnosis Difficulty walking;Generalized weakness   PT Problem List Decreased strength;Decreased activity tolerance;Decreased balance;Decreased mobility;Decreased cognition;Decreased knowledge of use of DME;Decreased safety awareness;Decreased knowledge of precautions  PT Treatment Interventions DME instruction;Gait training;Stair training;Functional mobility training;Therapeutic activities;Therapeutic exercise;Balance training;Patient/family education;Cognitive remediation   PT Goals (Current goals can be found in the Care Plan section) Acute Rehab PT Goals PT Goal Formulation: Patient unable to participate in goal setting Time For Goal Achievement: 12/25/15 Potential to Achieve Goals: Fair    Frequency 7X/week   Barriers to discharge        Co-evaluation               End of Session Equipment Utilized During Treatment: Gait belt Activity Tolerance: Patient tolerated treatment well Patient left: in chair;with call bell/phone within reach;with nursing/sitter in room Community education officer(personal sitter and hospital sitter present with  patient) Nurse Communication: Mobility status         Time: 1453-1511 PT Time Calculation (min) (ACUTE ONLY): 18 min   Charges:   PT Evaluation $PT Eval Moderate Complexity: 1 Procedure PT Treatments $Neuromuscular Re-education: 8-22 mins   PT G Codes:        Tyquon Near H. Manson PasseyBrown, PT, DPT, NCS 12/11/2015, 4:32 PM [phone]

## 2015-12-11 NOTE — Progress Notes (Signed)
Pt has been calmed all day. Had two sitters at bedside. No distress noted at this time. Waiting for pt to go to MRI

## 2015-12-11 NOTE — Consult Note (Signed)
Cardiology Consultation Note  Patient ID: Lisa [name], MRN: [id number], DOB/AGE: 80-Feb-1928 80 y.o. Admit date: 12/09/2015   Date of Consult: 12/11/2015 Primary Physician: Rozanna Box, MD Primary Cardiologist: Dr. Kirke Corin, MD  Chief Complaint: Headache Reason for Consult: New onset Afib with RVR  HPI: 82 y.o. female with h/o chronic systolic CHF, ventricular tachycardia in the setting of intracranial hemorrhage with subdural and subarachnoid hemorrhage, history of cardiac arrest 2/2 the above Vtach, and history of DVT in November 2016 who was recently admitted to [facility] in the first of March with influenza A and sepsis presented to [facility] on 3/19 with TIA/headache. She subsequently developed Afib with RVR on the 3/20 in the PM. Cardiology is consulted for further evaluation.    She presented in November 2016 with subdural and subarachnoid hemorrhage after a fall. She suffered cardiac arrest at that time due to ventricular tachycardia. Troponin was mildly elevated. Echocardiogram showed an ejection fraction of 15-20% with akinesis of the entire anteroseptal myocardium with moderate pulmonary hypertension. She was suspected of having stress-induced cardiomyopathy and was managed conservatively. There was discussion about comfort care. However, surprisingly the patient slowly recovered. She was discharged to rehabilitation. She developed multiple complications including urinary tract infection and right leg DVT. She was treated with anticoagulation (Eliquis) after clearance by neurosurgery. The patient made good recovery and ultimately she was discharged home. At her last follow up in February 2017 she was doing reasonably well and was without any chest pain or palpitations. She was recently admitted for influenza A at the beginning of March complicated by sepsis. EKG is not scanned into Epic at this time for review. Her caregiver notes right-sided weakness and numbness since February. She presented to [facility] on  3/19 with severe headache that had been present since January. Headache was intermittent. Per caregiver there never was any chest pain, SOB, or tachy-palpitations. Upon the patient's arrival to [facility] she was noted to be in sinus rhythm with a HR of 83 bpm. CT head showed no acute intracranial abnormalities. Potassium 4.1-->3.8, albumin 2.8, SCr 1.42, hgb 10.6-->9.7. She was seen by neurology with MRI and MRA pending. Carotid dopplers are pending.   She developed Afib with RVR in the PM of 3/20. She has been asymptomatic. Her caregiver denies the patient missing any doses of Eliquis. The patient was previously on amiodarone at home up until 10/2015 at which time this was discontinued given her improved EF of 35-40% by echo 10/2015. On telemetry since 3/20 she has been in Afib with intermittent RVR with HR into the 120s.  Past Medical History  Diagnosis Date  . Allergy   . Pelvis fracture (HCC)   . Wrist fracture, right   . Depression   . Spinal stenosis   . Osteoarthritis   . Hypertension   . Foot fracture     bilateral  . Brain bleed (HCC)   . V-tach (HCC)   . CHF (congestive heart failure) (HCC)   . Renal disorder       Most Recent Cardiac Studies: Echo 10/2015: Study Conclusions  - Left ventricle: The cavity size was normal. There was mild focal  basal hypertrophy of the septum. Systolic function was moderately  reduced. The estimated ejection fraction was in the range of 35%  to 40%. Severe hypokinesis of the anteroseptal myocardium Doppler  parameters are consistent with abnormal left ventricular  relaxation (grade 1 diastolic dysfunction). - Aortic valve: There was mild regurgitation. - Mitral valve: Calcified annulus. There was  mild to moderate  regurgitation. - Left atrium: The atrium was normal in size. - Right ventricle: Systolic function was normal. - Pulmonary arteries: Systolic pressure was within the normal  range.   Surgical History:  Past Surgical History    Procedure Laterality Date  . Back surgery N/A   . Breast lumpectomy N/A      Home Meds: Prior to Admission medications   Medication Sig Start Date End Date Taking? Authorizing Provider  acetaminophen (TYLENOL) 325 MG tablet Take 1-2 tablets (325-650 mg total) by mouth every 4 (four) hours as needed for mild pain. 09/12/15  Yes Evlyn Kanner Love, PA-C  albuterol (PROVENTIL HFA;VENTOLIN HFA) 108 (90 Base) MCG/ACT inhaler Inhale 2 puffs into the lungs every 4 (four) hours as needed for wheezing or shortness of breath (cough). 10/10/15  Yes Richardean Canal, MD  citalopram (CELEXA) 20 MG tablet Take 1 tablet (20 mg total) by mouth daily. 08/22/15  Yes Richarda Overlie, MD  ELIQUIS 5 MG TABS tablet Take 1 tablet by mouth 2 (two) times daily. Reported on 11/08/2015 10/04/15  Yes Historical Provider, MD  feeding supplement, ENSURE ENLIVE, (ENSURE ENLIVE) LIQD Take 237 mLs by mouth 3 (three) times daily between meals. 09/12/15  Yes Evlyn Kanner Love, PA-C  guaiFENesin-dextromethorphan (ROBITUSSIN DM) 100-10 MG/5ML syrup Take 5 mLs by mouth every 4 (four) hours as needed for cough. 11/21/15  Yes Wyatt Haste, MD  HYDROcodone-acetaminophen (NORCO/VICODIN) 5-325 MG tablet Take 1 tablet by mouth every 6 (six) hours as needed for moderate pain. 09/12/15  Yes Evlyn Kanner Love, PA-C  irbesartan (AVAPRO) 300 MG tablet Take 150 mg by mouth daily. 10/05/15  Yes Historical Provider, MD  metoprolol tartrate (LOPRESSOR) 25 MG tablet Take 0.5 tablets (12.5 mg total) by mouth 2 (two) times daily with a meal. 08/22/15  Yes Richarda Overlie, MD  mirtazapine (REMERON) 15 MG tablet Take 1 tablet by mouth daily. 10/04/15  Yes Historical Provider, MD  omeprazole (PRILOSEC) 20 MG capsule Take 1 capsule by mouth daily. 10/05/15  Yes Historical Provider, MD  pantoprazole (PROTONIX) 20 MG tablet Take 1 tablet (20 mg total) by mouth 2 (two) times daily. 09/12/15  Yes Evlyn Kanner Love, PA-C  potassium chloride SA (K-DUR,KLOR-CON) 20 MEQ tablet Take 1 tablet (20  mEq total) by mouth daily. 09/12/15  Yes Evlyn Kanner Love, PA-C  QUEtiapine (SEROQUEL) 50 MG tablet Take 1 tablet (50 mg total) by mouth at bedtime. 09/12/15  Yes Evlyn Kanner Love, PA-C  raloxifene (EVISTA) 60 MG tablet Take 1 tablet by mouth daily. 10/05/15  Yes Historical Provider, MD  senna-docusate (SENOKOT-S) 8.6-50 MG tablet Take 2 tablets by mouth 2 (two) times daily. 09/12/15  Yes Evlyn Kanner Love, PA-C  oseltamivir (TAMIFLU) 30 MG capsule Take 1 capsule (30 mg total) by mouth daily. Patient not taking: Reported on 12/09/2015 11/21/15   Wyatt Haste, MD    Inpatient Medications:  . apixaban  5 mg Oral BID  . aspirin  300 mg Rectal Daily   Or  . aspirin  325 mg Oral Daily  . citalopram  20 mg Oral Daily  . feeding supplement (ENSURE ENLIVE)  237 mL Oral TID BM  . irbesartan  150 mg Oral Daily  . mirtazapine  15 mg Oral Daily  . pantoprazole  40 mg Oral Daily  . potassium chloride SA  20 mEq Oral Daily  . QUEtiapine  50 mg Oral QHS  . raloxifene  60 mg Oral Daily  . senna-docusate  2  tablet Oral BID      Allergies:  Allergies  Allergen Reactions  . Ambien [Zolpidem Tartrate] Other (See Comments)    Reaction:  Hallucinations   . Daypro [Oxaprozin] Other (See Comments)    GI upset  . Oxycodone Nausea And Vomiting  . Shellfish Allergy Swelling and Other (See Comments)    Pts mouth and face swells.   . Ace Inhibitors Cough  . Norvasc [Amlodipine Besylate] Palpitations    Social History   Social History  . Marital Status: Widowed    Spouse Name: N/A  . Number of Children: N/A  . Years of Education: N/A   Occupational History  . retired    Social History Main Topics  . Smoking status: Never Smoker   . Smokeless tobacco: Never Used  . Alcohol Use: No  . Drug Use: No  . Sexual Activity: Not on file   Other Topics Concern  . Not on file   Social History Narrative     Family History  Problem Relation Age of Onset  . COPD Mother   . COPD Father   . Heart disease  Father      Review of Systems: Review of Systems  Unable to perform ROS: dementia    Labs: No results for input(s): CKTOTAL, CKMB, TROPONINI in the last 72 hours. Lab Results  Component Value Date   WBC 8.5 12/10/2015   HGB 9.7* 12/10/2015   HCT 29.2* 12/10/2015   MCV 90.0 12/10/2015   PLT 200 12/10/2015    Recent Labs Lab 12/10/15 0537  NA 138  K 3.8  CL 111  CO2 24  BUN 15  CREATININE 1.42*  CALCIUM 7.3*  PROT 5.4*  BILITOT 0.6  ALKPHOS 54  ALT 11*  AST 29  GLUCOSE 82   Lab Results  Component Value Date   CHOL 174 12/10/2015   HDL 44 12/10/2015   LDLCALC 107* 12/10/2015   TRIG 115 12/10/2015   No results found for: DDIMER  Radiology/Studies:  Ct Head Wo Contrast  12/09/2015  CLINICAL DATA:  Severe headache at 4 p.m. today. History of head injury with intracranial hemorrhage on 11/16. EXAM: CT HEAD WITHOUT CONTRAST TECHNIQUE: Contiguous axial images were obtained from the base of the skull through the vertex without intravenous contrast. COMPARISON:  08/30/2015 FINDINGS: Mild diffuse cerebral atrophy. Mild ventricular dilatation consistent with central atrophy. Low-attenuation changes in the deep white matter consistent with small vessel ischemia. Low-attenuation areas in both anterior frontal lobes consistent with old areas of encephalomalacia likely posttraumatic or ischemic. These areas have become better defined since previous study suggesting typical evolution of previous acute change. No mass effect or midline shift. No sulcal effacement. Gray-white matter junctions are distinct. Basal cisterns are not effaced. No abnormal extra-axial fluid collections. No acute intracranial hemorrhage. Calvarium appears intact. Air-fluid levels in the maxillary antra bilaterally may indicate sinusitis. Mastoid air cells are not opacified. Vascular calcifications. IMPRESSION: No acute intracranial abnormalities. Chronic atrophy and small vessel ischemic changes bilaterally with  old encephalomalacia in the anterior frontal lobes. Air-fluid levels in the paranasal sinuses may indicate acute sinusitis. Electronically Signed   By: Burman Nieves M.D.   On: 12/09/2015 20:38   Dg Chest Port 1 View  11/21/2015  CLINICAL DATA:  82 year old female with cough and fever EXAM: PORTABLE CHEST 1 VIEW COMPARISON:  Radiograph dated 10/23/2015 FINDINGS: The heart size and mediastinal contours are within normal limits. Both lungs are clear. The visualized skeletal structures are unremarkable. IMPRESSION: No active  disease. Electronically Signed   By: Elgie CollardArash  Radparvar M.D.   On: 11/21/2015 02:06    EKG: NSR, 83 bpm, LBBB (old)  Weights: Filed Weights   12/09/15 2008 12/10/15 0346  Weight: 145 lb (65.772 kg) 150 lb 11.2 oz (68.357 kg)     Physical Exam: Blood pressure 148/88, pulse 53, temperature 98.4 F (36.9 C), temperature source Oral, resp. rate 18, height 5\' 2"  (1.575 m), weight 150 lb 11.2 oz (68.357 kg), SpO2 96 %. Body mass index is 27.56 kg/(m^2). General: Well developed, well nourished, in no acute distress. Head: Normocephalic, atraumatic, sclera non-icteric, no xanthomas, nares are without discharge.  Neck: Negative for carotid bruits. JVD not elevated. Lungs: Clear bilaterally to auscultation without wheezes, rales, or rhonchi. Breathing is unlabored. Heart: Tachycardic, irregularly irregular with S1 S2. No murmurs, rubs, or gallops appreciated. Abdomen: Soft, non-tender, non-distended with normoactive bowel sounds. No hepatomegaly. No rebound/guarding. No obvious abdominal masses. Msk:  Strength and tone appear normal for age. Extremities: No clubbing or cyanosis. No edema.  Distal pedal pulses are 2+ and equal bilaterally. Neuro: Alert and oriented X 3. No facial asymmetry. No focal deficit. Moves all extremities spontaneously. Psych:  Responds to questions appropriately with a normal affect.    Assessment and Plan:   1. New onset Afib with RVR: -She developed  new onset Afib on the PM of 3/20 and has been in intermittent Afib with RVR since then -She has been adequately anticoagulated with Eliquis 5 mg bid prior to her admission given her prior lower extremity DVT in November 2016 -Given that she has been in Afib for less than 48 hours and has been adequately anticoagulated at this time would pursue chemical cardioversion with amiodarone, which she was previously on prior to 10/2015 and tolerated well without issues -Will give amiodarone 400 mg tid today in an effort to convert her to sinus rhythm today followed by 400 mg bid on 3/22 x 1 week, then 200 mg x 1 week, then 200 mg daily thereafter -Regarding her anticoagulation, if ok with neurology continue at this time, pending MRI/MRA. If this shows a large stroke this will likely need to be held however -CHADS2VASC at least 7 (CHF, HTN, age x 2, possible TIA, female) -Her HR at times has been bradycardic which may preclude the usage of BB at this time  2. Chronic systolic CHF: -She does not appear to be volume overloaded at this time -Add BB when able -Continue irbesartan -EF as above  3. History of Vtach/cardiac arrest: -Amiodarone as above -Monitor on tele  4. Headache: -CT head negative -MRI/MRA pending -Carotid dopplers pending -Neurology on board  5. Dementia: -At baseline  Elinor DodgeSigned, Almeda Ezra, PA-C Pager: [phone] 12/11/2015, 10:23 AM

## 2015-12-11 NOTE — Progress Notes (Signed)
Speech Language Pathology Treatment: Dysphagia  Patient Details Name: Lisa [name] MRN: [id number] DOB: 08/23/1927 Today's Date: 12/11/2015 Time: 1300-1330 SLP Time Calculation (min) (ACUTE ONLY): 30 min  Assessment / Plan / Recommendation Clinical Impression  Pt appears to be tolerating her current po diet(puree); exhibited no s/s of aspiration or difficulty w/ trials of Dys. 2 consistency including mastication of cut, soft pieces of food representing a Dys. 2 diet consistency during tx session.   Pt given tray setup assistance but fed self fully. Pt continues to have confusion at baseline and requires verbal cues but appeared to tolerate po trials/meal. Rec. upgrade of diet to Dys. 2 w/ thin liquids w/ aspiration precautions; continue meds w/ puree - crushed if nec. for easier swallowing. ST w/ f/u w/ toleration of po diet; education on aspiration precautions as needed. NSG/staff updated and agreed.   HPI HPI: Pt is a 82 y.o. female with a known history of Dementia(per Neurology note), spinal stenosis, osteoarthritis, hypertension, ventricular tachycardia, congestive heart failure, renal disorder, cerebral hemorrhage in the past presented to the emergency room with headache. Patient's headache started this afternoon was throbbing in nature. It was 6 out of 10 on a scale of 1-10 .patient's caregiver was in the room and she gave information. Patient has 24 hours/7 days caregiver services. She walks with a walker and is dependent on activities of daily living. According to the care of your patient also had a some numbness in the left side of the face she also had some slurred speech which occurred late this afternoon. Patient was trying to get out of the bed in the emergency room she was given IV Ativan and IM Haldol. Patient is lethargic and sleepy and unable to give any history. Most of the information above was obtained from the patient's caregiver was at the bedside. Patient was worked up with a CT head  in the emergency room which showed no acute intracranial abnormality. During eval, pt was agitated and moving about in bed wanting to get up(NSG had recently give Ativan). Pt required mod. cues to attend to task of taking po's and required some support w/ feeding self sec. Cognitive status though pt attempted doing it herself. Pt was oriented to self only; verbally conversive but min. confused. Pt is calmer today but requires Sitter still. She is tolerating the pureed diet consistency per report.      SLP Plan  Continue with current plan of care     Recommendations  Diet recommendations: Dysphagia 2 (fine chop);Thin liquid Liquids provided via: Cup;Straw Medication Administration: Whole meds with puree (crushed if nec/able) Supervision: Staff to assist with self feeding;Intermittent supervision to cue for compensatory strategies Compensations: Minimize environmental distractions;Slow rate;Small sips/bites;Follow solids with liquid Postural Changes and/or Swallow Maneuvers: Seated upright 90 degrees;Upright 30-60 min after meal             General recommendations:  (Dietician f/u as indicated) Oral Care Recommendations: Oral care BID;Staff/trained caregiver to provide oral care Follow up Recommendations: Skilled Nursing facility (TBD by PT/OT needs; appears close to baseline for ST) Plan: Continue with current plan of care     GO               Jerilynn SomKatherine Watson, MS, CCC-SLP  Watson,Katherine 12/11/2015, 1:46 PM

## 2015-12-11 NOTE — Progress Notes (Signed)
Dr Sherryll BurgerShah was made aware of pt  HR changed to A fib

## 2015-12-11 NOTE — Progress Notes (Signed)
MRI called to say they were unable to get the pt in the MRI today. Pt was refusing to get it done. They stated they would call MD to notified him .

## 2015-12-11 NOTE — Progress Notes (Signed)
Notified physician that patient is refusing to have CT completed. Orders to discontinue.

## 2015-12-11 NOTE — Plan of Care (Signed)
Problem: Coping: Goal: Ability to identify strategies to decrease anxiety will improve Outcome: Progressing Remains confused with intermittent impulsive episodes with attempts to get out of bed.  Sitter at bedside this shift.

## 2015-12-11 NOTE — Plan of Care (Signed)
Problem: SLP Dysphagia Goals Goal: Misc Dysphagia Goal Pt will safely tolerate po diet of least restrictive consistency w/ no overt s/s of aspiration noted by Staff/pt/family x3 sessions.    

## 2015-12-11 NOTE — Progress Notes (Signed)
[facility] [place] [facility]   PATIENT NAME: Lisa LeepFaye [name]    MR#:  [id number]  DATE OF BIRTH:  03/17/1927  SUBJECTIVE:  CHIEF COMPLAINT:   Chief Complaint  Patient presents with  . Headache  feels much better, waiting to get MRI, developed new a.fib  REVIEW OF SYSTEMS:  Review of Systems  Constitutional: Negative for fever, weight loss, malaise/fatigue and diaphoresis.  HENT: Negative for ear discharge, ear pain, hearing loss, nosebleeds, sore throat and tinnitus.   Eyes: Negative for blurred vision and pain.  Respiratory: Negative for cough, hemoptysis, shortness of breath and wheezing.   Cardiovascular: Negative for chest pain, palpitations, orthopnea and leg swelling.  Gastrointestinal: Negative for heartburn, nausea, vomiting, abdominal pain, diarrhea, constipation and blood in stool.  Genitourinary: Negative for dysuria, urgency and frequency.  Musculoskeletal: Positive for falls. Negative for myalgias and back pain.  Skin: Negative for itching and rash.  Neurological: Negative for dizziness, tingling, tremors, focal weakness, seizures, weakness and headaches.  Psychiatric/Behavioral: Negative for depression. The patient is not nervous/anxious.     DRUG ALLERGIES:   Allergies  Allergen Reactions  . Ambien [Zolpidem Tartrate] Other (See Comments)    Reaction:  Hallucinations   . Daypro [Oxaprozin] Other (See Comments)    GI upset  . Oxycodone Nausea And Vomiting  . Shellfish Allergy Swelling and Other (See Comments)    Pts mouth and face swells.   . Ace Inhibitors Cough  . Norvasc [Amlodipine Besylate] Palpitations   VITALS:  Blood pressure 167/56, pulse 108, temperature 98.2 F (36.8 C), temperature source Oral, resp. rate 20, height 5\' 2"  (1.575 m), weight 68.357 kg (150 lb 11.2 oz), SpO2 97 %. PHYSICAL EXAMINATION:  Physical Exam  Constitutional: She is oriented to person, place, and time and well-developed, well-nourished, and in no  distress.  HENT:  Head: Normocephalic and atraumatic.  Eyes: Conjunctivae and EOM are normal. Pupils are equal, round, and reactive to light.  Neck: Normal range of motion. Neck supple. No tracheal deviation present. No thyromegaly present.  Cardiovascular: Normal rate and normal heart sounds.  An irregularly irregular rhythm present.  Pulmonary/Chest: Effort normal and breath sounds normal. No respiratory distress. She has no wheezes. She exhibits no tenderness.  Abdominal: Soft. Bowel sounds are normal. She exhibits no distension. There is no tenderness.  Musculoskeletal: Normal range of motion.  Neurological: She is alert and oriented to person, place, and time. No cranial nerve deficit.  Skin: Skin is warm and dry. No rash noted.  Psychiatric: Mood and affect normal.   LABORATORY PANEL:   CBC  Recent Labs Lab 12/10/15 0537  WBC 8.5  HGB 9.7*  HCT 29.2*  PLT 200   ------------------------------------------------------------------------------------------------------------------ Chemistries   Recent Labs Lab 12/10/15 0537  NA 138  K 3.8  CL 111  CO2 24  GLUCOSE 82  BUN 15  CREATININE 1.42*  CALCIUM 7.3*  AST 29  ALT 11*  ALKPHOS 54  BILITOT 0.6   RADIOLOGY:  No results found. ASSESSMENT AND PLAN:   82 year old elderly female patient with history of hypertension, ventricular tachycardia, congestive heart failure, cerebral hemorrhage resented to the emergency room with headache and the numbness of the left side of the face and difficulty speech.  1. Transient ischemic attack: MRI brain couldn't be done (she refused). just recently had echocardiogram (on 2/27 showing EF 35-40%) so will hold off. Appreciate Neurology consultation, continue asa, eliquis. PT/OT recommends HHPT with 24 hr supervision 2. New onset a.fib: cardio  started amio, already on elqiuis. 3. Hypertension: will let it autoregulate in acute phase. 4. Congestive heart failure: chronic systolic, well  compensated at this time. 5. Headache: resolved   All the records are reviewed and case discussed with Care Management/Social Worker. Management plans discussed with the patient, family and they are in agreement.  CODE STATUS: FULL CODE (Palliative care cs)  TOTAL TIME TAKING CARE OF THIS PATIENT: 25 minutes.   More than 50% of the time was spent in counseling/coordination of care: YES (caregiver & sitter at bedside, d/w son Link Snuffer @ [phone])  POSSIBLE D/C IN 1-2 DAYS, DEPENDING ON CLINICAL CONDITION. AND NEURO EVAL   Apolonio Cutting M.D on 12/11/2015 at 4:59 PM  Between 7am to 6pm - Pager - [phone]  After 6pm go to [email or web address] - password [email or web address] [facility]  [facility]  Office  [phone]  CC: Primary care physician; BABAOFF, Lavada Mesi, MD  Note: This dictation was prepared with Dragon dictation along with smaller phrase technology. Any transcriptional errors that result from this process are unintentional.

## 2015-12-12 ENCOUNTER — Inpatient Hospital Stay: Payer: Medicare Other

## 2015-12-12 DIAGNOSIS — F0391 Unspecified dementia with behavioral disturbance: Secondary | ICD-10-CM

## 2015-12-12 LAB — CBC
HCT: 31.1 % — ABNORMAL LOW (ref 35.0–47.0)
Hemoglobin: 10.3 g/dL — ABNORMAL LOW (ref 12.0–16.0)
MCH: 29.4 pg (ref 26.0–34.0)
MCHC: 33 g/dL (ref 32.0–36.0)
MCV: 89.1 fL (ref 80.0–100.0)
PLATELETS: 198 10*3/uL (ref 150–440)
RBC: 3.49 MIL/uL — AB (ref 3.80–5.20)
RDW: 15.6 % — AB (ref 11.5–14.5)
WBC: 5.8 10*3/uL (ref 3.6–11.0)

## 2015-12-12 LAB — CREATININE, SERUM
CREATININE: 1.56 mg/dL — AB (ref 0.44–1.00)
GFR calc non Af Amer: 29 mL/min — ABNORMAL LOW (ref >60)
GFR, EST AFRICAN AMERICAN: 33 mL/min — AB (ref >60)

## 2015-12-12 MED ORDER — AMIODARONE HCL 200 MG PO TABS: 200.0000 mg | ORAL_TABLET | Freq: Two times a day (BID) | ORAL | Status: DC

## 2015-12-12 MED ORDER — LORAZEPAM 2 MG/ML IJ SOLN
1.0000 mg | Freq: Once | INTRAMUSCULAR | Status: AC
  Administered 2015-12-12: 1 mg via INTRAVENOUS
  Filled 2015-12-12: qty 1

## 2015-12-12 NOTE — Progress Notes (Signed)
CSW was informed by RN Case Manager that patient's MRI was negative. PT recommended HH for patient. Patient has 24 hour care from [facility] and her son is agreeable for her to return home. CSW is signing off. CSW is available if a need were to arise.   Woodroe Modehristina Armie Moren, MSW, LCSW-A Clinical Social Work Department [phone]

## 2015-12-12 NOTE — Care Management (Signed)
[facility] MRI pending. Waiting for results to confirm stroke vs. TIA

## 2015-12-12 NOTE — Progress Notes (Signed)
OT Cancellation Note  Patient Details Name: Lisa SnellenFaye K Corea MRN: [id number] DOB: 10/10/1926   Cancelled Treatment:    Reason Eval/Treat Not Completed: Fatigue/lethargy limiting ability to participate;Other (comment) (Pt. caregiver reports pt. finally agreed to have the MRI. Pt. is awaiting MRI.[name].)  Olegario MessierElaine Lionell Matuszak, MS, OTR/L  Olegario MessierElaine Madelin Weseman 12/12/2015, 10:39 AM

## 2015-12-12 NOTE — Progress Notes (Signed)
Notified by Central Telemetry Monitoring of HR in 30's.  Patient asleep and easily arousable.    Denies pain of SOB. SR 80's noted on bedside tele box. Remains pleasantly confused with private personal sitter at bedside this shift.

## 2015-12-12 NOTE — Discharge Instructions (Signed)

## 2015-12-12 NOTE — Plan of Care (Signed)
Problem: Tissue Perfusion: Goal: Cerebral tissue perfusion will improve Outcome: Progressing No changes to neuro status noted.  Remains confused but cooperative this shift.

## 2015-12-12 NOTE — Care Management (Addendum)
MRI negative for stroke. Attempted to reach son by phone without success to discuss discharge planning. No family at bedside.
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
## 2015-12-12 NOTE — Care Management Note (Signed)
Case Management Note  Patient Details  Name: Lisa SnellenFaye K Llerenas MRN: [id number] Date of Birth: 01/27/1927  Subjective/Objective:   Spoke with son by phone. Discussed home with home health. He is agreeable with no agency preference. [facility]. Patient uses a walker at home. Case closed.                 Action/Plan: SN/PT  Expected Discharge Date:                  Expected Discharge Plan:  Home w Home Health Services  In-House Referral:     Discharge planning Services  CM Consult  Post Acute Care Choice:  Home Health Choice offered to:  Adult Children  DME Arranged:    DME Agency:     HH Arranged:  PT, RN HH Agency:  [facility]  Status of Service:  Completed, signed off  Medicare Important Message Given:    Date Medicare IM Given:    Medicare IM give by:    Date Additional Medicare IM Given:    Additional Medicare Important Message give by:     If discussed at Long Length of Stay Meetings, dates discussed:    Additional Comments:  [name], RN 12/12/2015, 2:15 PM

## 2015-12-12 NOTE — Progress Notes (Signed)
Patient: Lisa [name] / Admit Date: 12/09/2015 / Date of Encounter: 12/12/2015, 10:04 AM   Subjective: Sleeping this morning. Spoke with both caregivers in the room. Agitated overnight. Still waiting on MRI/MRA.   Review of Systems: Review of Systems  Unable to perform ROS: dementia     Objective: Telemetry: NSR with PACs, 70's, episode of bradycardia into the 40's this morning, Afib with RVR on 3/21 into the 120's around noon  Physical Exam: Blood pressure 150/52, pulse 76, temperature 97.9 F (36.6 C), temperature source Oral, resp. rate 16, height  (1.575 m), weight 150 lb 11.2 oz (68.357 kg), SpO2 92 %. Body mass index is 27.56 kg/(m^2). General: Well developed, well nourished, in no acute distress. Head: Normocephalic, atraumatic, sclera non-icteric, no xanthomas, nares are without discharge. Neck: Negative for carotid bruits. JVP not elevated. Lungs: Clear bilaterally to auscultation without wheezes, rales, or rhonchi. Breathing is unlabored. Heart: Irregular, S1 S2 without murmurs, rubs, or gallops.  Abdomen: Soft, non-tender, non-distended with normoactive bowel sounds. No rebound/guarding. Extremities: No clubbing or cyanosis. No edema. Distal pedal pulses are 2+ and equal bilaterally. Neuro: Alert. Moves all extremities spontaneously. Psych:  Confused.   Intake/Output Summary (Last 24 hours) at 12/12/15 1004 Last data filed at 12/12/15 0437  Gross per 24 hour  Intake    300 ml  Output    400 ml  Net   -100 ml    Inpatient Medications:  . amiodarone  400 mg Oral BID  . apixaban  5 mg Oral BID  . aspirin  300 mg Rectal Daily   Or  . aspirin  325 mg Oral Daily  . citalopram  20 mg Oral Daily  . feeding supplement (ENSURE ENLIVE)  237 mL Oral TID BM  . irbesartan  150 mg Oral Daily  . LORazepam  1 mg Intravenous Once  . metoprolol tartrate  12.5 mg Oral BID  . mirtazapine  15 mg Oral Daily  . pantoprazole  40 mg Oral Daily  . potassium chloride SA  20 mEq  Oral Daily  . QUEtiapine  50 mg Oral QHS  . raloxifene  60 mg Oral Daily  . senna-docusate  2 tablet Oral BID   Infusions:    Labs:  Recent Labs  12/09/15 2055 12/10/15 0537 12/12/15 0548  NA 138 138  --   K 4.1 3.8  --   CL 111 111  --   CO2 24 24  --   GLUCOSE 83 82  --   BUN 15 15  --   CREATININE 1.46* 1.42* 1.56*  CALCIUM 7.6* 7.3*  --     Recent Labs  12/10/15 0537  AST 29  ALT 11*  ALKPHOS 54  BILITOT 0.6  PROT 5.4*  ALBUMIN 2.8*    Recent Labs  12/10/15 0537 12/12/15 0548  WBC 8.5 5.8  HGB 9.7* 10.3*  HCT 29.2* 31.1*  MCV 90.0 89.1  PLT 200 198   No results for input(s): CKTOTAL, CKMB, TROPONINI in the last 72 hours. Invalid input(s): POCBNP  Recent Labs  12/10/15 0537  HGBA1C 5.6     Weights: Filed Weights   12/09/15 2008 12/10/15 0346  Weight: 145 lb (65.772 kg) 150 lb 11.2 oz (68.357 kg)     Radiology/Studies:  Ct Head Wo Contrast  12/11/2015  CLINICAL DATA:  Altered mental status. Headache. Difficulty with speech EXAM: CT HEAD WITHOUT CONTRAST TECHNIQUE: Contiguous axial images were obtained from the base of the skull through the  vertex without intravenous contrast. COMPARISON:  CT head 12/09/2015 FINDINGS: Chronic encephalomalacia in the frontal lobes bilaterally due to chronic head trauma. This is stable. Mild chronic microvascular ischemic change in the white matter. Mild atrophy.  Mild ventricular prominence unchanged Negative for acute infarct.  Negative for acute hemorrhage or mass Air-fluid level right maxillary sinus unchanged. Negative for skull fracture IMPRESSION: Chronic encephalomalacia left frontal lobes bilaterally due to prior head trauma. Atrophy and chronic microvascular ischemia.  No acute abnormality. Air-fluid level right maxillary sinus unchanged. Electronically Signed   By: Marlan Palauharles  Clark M.D.   On: 12/11/2015 18:08   Ct Head Wo Contrast  12/09/2015  CLINICAL DATA:  Severe headache at 4 p.m. today. History of head  injury with intracranial hemorrhage on 11/16. EXAM: CT HEAD WITHOUT CONTRAST TECHNIQUE: Contiguous axial images were obtained from the base of the skull through the vertex without intravenous contrast. COMPARISON:  08/30/2015 FINDINGS: Mild diffuse cerebral atrophy. Mild ventricular dilatation consistent with central atrophy. Low-attenuation changes in the deep white matter consistent with small vessel ischemia. Low-attenuation areas in both anterior frontal lobes consistent with old areas of encephalomalacia likely posttraumatic or ischemic. These areas have become better defined since previous study suggesting typical evolution of previous acute change. No mass effect or midline shift. No sulcal effacement. Gray-white matter junctions are distinct. Basal cisterns are not effaced. No abnormal extra-axial fluid collections. No acute intracranial hemorrhage. Calvarium appears intact. Air-fluid levels in the maxillary antra bilaterally may indicate sinusitis. Mastoid air cells are not opacified. Vascular calcifications. IMPRESSION: No acute intracranial abnormalities. Chronic atrophy and small vessel ischemic changes bilaterally with old encephalomalacia in the anterior frontal lobes. Air-fluid levels in the paranasal sinuses may indicate acute sinusitis. Electronically Signed   By: Burman NievesWilliam  Stevens M.D.   On: 12/09/2015 20:38   Dg Chest Port 1 View  11/21/2015  CLINICAL DATA:  82 year old female with cough and fever EXAM: PORTABLE CHEST 1 VIEW COMPARISON:  Radiograph dated 10/23/2015 FINDINGS: The heart size and mediastinal contours are within normal limits. Both lungs are clear. The visualized skeletal structures are unremarkable. IMPRESSION: No active disease. Electronically Signed   By: Elgie CollardArash  Radparvar M.D.   On: 11/21/2015 02:06     Assessment and Plan   1. New onset Afib with RVR: -She developed new onset Afib on the PM of 3/20, converting back to sinus rhythm in the morning of 3/21, going back into  Afib with RVR around noon on 3/21 briefly, subsequently converting back to sinus rhythm with PACs -Heart rate has been bradycardic into the 40's at times -She has been adequately anticoagulated with Eliquis 5 mg bid prior to her admission given her prior lower extremity DVT in November 2016 -Will decrease her amiodarone to 200 mg bid given her bradycardia -Her bradycardia precludes the usage of beta blocker at this time -Regarding her anticoagulation, if ok with neurology continue at this time, pending MRI/MRA. If this shows a large stroke this will likely need to be held however -It is unclear if she needs to be on dual therapy with Eliquis and full-dose aspirin. Please have cardiology MD and neurology comment if this is needed -CHADS2VASC at least 7 (CHF, HTN, age x 2, possible TIA, female)  2. Chronic systolic CHF: -She does not appear to be volume overloaded at this time -Add BB when able -Continue irbesartan -EF as above  3. History of Vtach/cardiac arrest: -Amiodarone as above -Monitor on tele  4. Headache: -CT head negative -MRI/MRA pending -Carotid dopplers pending -  Neurology on board  5. Dementia: -At baseline   Elinor Dodge, PA-C Pager: [phone] 12/12/2015, 10:04 AM    Attending Note Patient seen and examined, agree with detailed note above,  Patient presentation and plan discussed on rounds.   Patient is sleeping this morning Case discussed with her caretakers who have known her from the nursing facility Worsening confusion in the past month or so, Speech is not at baseline Very agitated this admission, finally fell asleep this morning  Review of telemetry shows normal sinus rhythm, no further atrial fibrillation One episode at 4:30 this morning with sinus bradycardia, some bigeminy, heart rates into the 40s This was seen 24 hours earlier around the same time at 4:30 AM Patient asymptomatic  Clinical exam unchanged, regular heart rate, lungs  clear, abdomen soft, no leg edema Lab work reviewed  --Would recommend we continue amiodarone 400 mg for another 3 or 4 days, then down to 200 mg twice a day for rhythm control.  Unclear patient will be able to perform MRI of the brain   Greater than 50% was spent in counseling and coordination of care with patient Total encounter time 25 minutes or more Long discussion with caretakers   Signed: Dossie Arbour  M.D., Ph.D. [facility]

## 2015-12-12 NOTE — [place] FL2 (Signed)
[place] MEDICAID FL2 LEVEL OF CARE SCREENING TOOL     IDENTIFICATION  Patient Name: Lisa [name] Birthdate: 11/26/1926 Sex: female Admission Date (Current Location): 12/09/2015  [place]   Facility and Address:  [facility], [facility], [address], [place], [place] [address]      Provider Number: [id number]  Attending Physician Name and Address:  Delfino LovettVipul Shah, MD  Relative Name and Phone Number:       Current Level of Care: Hospital Recommended Level of Care: Skilled Nursing Facility Prior Approval Number:    Date Approved/Denied:   PASRR Number:  [id number])  Discharge Plan: SNF    Current Diagnoses: Patient Active Problem List   Diagnosis Date Noted  . New onset atrial fibrillation (HCC) 12/11/2015  . Altered mental state   . Congestive dilated cardiomyopathy (HCC)   . Dementia   . Headache 12/10/2015  . TIA (transient ischemic attack) 12/10/2015  . Influenza A 11/21/2015  . Ventricular tachycardia (HCC) 11/09/2015  . Constipation 09/12/2015  . Insomnia due to anxiety and fear 09/12/2015  . Depression, controlled 09/12/2015  . Bacterial UTI 08/27/2015  . DVT, lower extremity (HCC) 08/27/2015  . TBI (traumatic brain injury) (HCC) 08/22/2015  . ICH (intracerebral hemorrhage) (HCC)   . Abscess   . Hypomagnesemia   . SDH (subdural hematoma) (HCC)   . Boil of upper extremity   . Pneumonia due to Haemophilus influenzae (HCC)   . Chronic systolic CHF (congestive heart failure) (HCC)   . Acute on chronic renal failure (HCC)   . Hypokalemia   . Bright red blood per rectum   . Dysphagia   . Pressure ulcer 08/16/2015  . Acute respiratory failure with hypoxemia (HCC)   . Hypernatremia   . Subdural hematoma (HCC) 08/03/2015  . Lumbar radiculopathy 06/06/2015  . Spinal stenosis, lumbar region, with neurogenic claudication 04/02/2015  . Degenerative lumbar disc 02/15/2015  . Lumbar post-laminectomy syndrome 02/15/2015   . Facet syndrome, lumbar 02/15/2015  . Sacroiliac joint dysfunction 02/15/2015    Orientation RESPIRATION BLADDER Height & Weight     Self  Normal Continent Weight: 150 lb 11.2 oz (68.357 kg) Height:  5\' 2"  (157.5 cm)  BEHAVIORAL SYMPTOMS/MOOD NEUROLOGICAL BOWEL NUTRITION STATUS   (None)  (None) Continent Diet (DYS 2)  AMBULATORY STATUS COMMUNICATION OF NEEDS Skin   Limited Assist   Normal                       Personal Care Assistance Level of Assistance  Bathing, Feeding, Dressing Bathing Assistance: Limited assistance Feeding assistance: Limited assistance Dressing Assistance: Limited assistance     Functional Limitations Info  Sight, Hearing, Speech Sight Info: Adequate Hearing Info: Adequate Speech Info: Adequate    SPECIAL CARE FACTORS FREQUENCY  PT (By licensed PT), OT (By licensed OT)     PT Frequency:  (5) OT Frequency:  (5)            Contractures      Additional Factors Info  Code Status, Allergies Code Status Info:  (Full Code) Allergies Info:  (Ambien, Daypro, Oxycodone, Shellfish Allergy, Ace Inhibitors, Norvasc)           Current Medications (12/12/2015):  This is the current hospital active medication list Current Facility-Administered Medications  Medication Dose Route Frequency Provider Last Rate Last Dose  . acetaminophen (TYLENOL) tablet 650 mg  650 mg Oral Q4H PRN Ihor AustinPavan Pyreddy, MD       Or  .  acetaminophen (TYLENOL) suppository 650 mg  650 mg Rectal Q4H PRN Ihor Austin, MD   650 mg at 12/10/15 0529  . albuterol (PROVENTIL) (2.5 MG/3ML) 0.083% nebulizer solution 2.5 mg  2.5 mg Inhalation Q4H PRN Ihor Austin, MD      . amiodarone (PACERONE) tablet 400 mg  400 mg Oral BID Ryan M Dunn, PA-C      . apixaban (ELIQUIS) tablet 5 mg  5 mg Oral BID Ihor Austin, MD   5 mg at 12/11/15 2125  . aspirin suppository 300 mg  300 mg Rectal Daily Ihor Austin, MD   300 mg at 12/10/15 0931   Or  . aspirin tablet 325 mg  325 mg Oral Daily  Ihor Austin, MD   325 mg at 12/11/15 0933  . citalopram (CELEXA) tablet 20 mg  20 mg Oral Daily Ihor Austin, MD   20 mg at 12/11/15 0933  . feeding supplement (ENSURE ENLIVE) (ENSURE ENLIVE) liquid 237 mL  237 mL Oral TID BM Pavan Pyreddy, MD   237 mL at 12/11/15 2000  . haloperidol lactate (HALDOL) injection 1 mg  1 mg Intravenous Q6H PRN Delfino Lovett, MD   1 mg at 12/11/15 2217  . HYDROcodone-acetaminophen (NORCO/VICODIN) 5-325 MG per tablet 1 tablet  1 tablet Oral Q6H PRN Ihor Austin, MD      . irbesartan (AVAPRO) tablet 150 mg  150 mg Oral Daily Ihor Austin, MD   150 mg at 12/11/15 0932  . LORazepam (ATIVAN) injection 1 mg  1 mg Intravenous Once Delfino Lovett, MD      . metoprolol tartrate (LOPRESSOR) tablet 12.5 mg  12.5 mg Oral BID Delfino Lovett, MD   12.5 mg at 12/11/15 1546  . mirtazapine (REMERON) tablet 15 mg  15 mg Oral Daily Ihor Austin, MD   15 mg at 12/11/15 0932  . pantoprazole (PROTONIX) EC tablet 40 mg  40 mg Oral Daily Ihor Austin, MD   40 mg at 12/11/15 0933  . potassium chloride SA (K-DUR,KLOR-CON) CR tablet 20 mEq  20 mEq Oral Daily Ihor Austin, MD   20 mEq at 12/11/15 0933  . QUEtiapine (SEROQUEL) tablet 50 mg  50 mg Oral QHS Ihor Austin, MD   50 mg at 12/11/15 2125  . raloxifene (EVISTA) tablet 60 mg  60 mg Oral Daily Ihor Austin, MD   60 mg at 12/11/15 0933  . senna-docusate (Senokot-S) tablet 1 tablet  1 tablet Oral QHS PRN Ihor Austin, MD   1 tablet at 12/10/15 2146  . senna-docusate (Senokot-S) tablet 2 tablet  2 tablet Oral BID Ihor Austin, MD   2 tablet at 12/11/15 2125     Discharge Medications: Please see discharge summary for a list of discharge medications.  Relevant Imaging Results:  Relevant Lab Results:   Additional Information  (SNN [id number])  Verta Ellen Missael Ferrari, LCSW

## 2015-12-12 NOTE — Progress Notes (Signed)
Patient agreed to have MRI. Patient asking for medication to help her get through MRI. Dr. Sherryll BurgerShah notified orders received for 1 mg of Ativan.

## 2015-12-12 NOTE — Progress Notes (Signed)
Patient discharged via wheelchair and private vehicle. IV removed and catheter intact. All discharge instructions given and patient verbalizes understanding. Tele removed and returned. No prescriptions given to patient No distress noted.   

## 2015-12-13 ENCOUNTER — Emergency Department: Payer: Medicare Other

## 2015-12-13 ENCOUNTER — Ambulatory Visit: Payer: Medicare Other | Admitting: Pain Medicine

## 2015-12-13 ENCOUNTER — Encounter: Payer: Self-pay | Admitting: Emergency Medicine

## 2015-12-13 ENCOUNTER — Emergency Department
Admission: EM | Admit: 2015-12-13 | Discharge: 2015-12-13 | Disposition: A | Discharge status: Home / Self Care | Payer: Medicare Other | Attending: Emergency Medicine | Admitting: Emergency Medicine

## 2015-12-13 DIAGNOSIS — R29810 Facial weakness: Secondary | ICD-10-CM | POA: Diagnosis present

## 2015-12-13 DIAGNOSIS — Z7901 Long term (current) use of anticoagulants: Secondary | ICD-10-CM | POA: Insufficient documentation

## 2015-12-13 DIAGNOSIS — I509 Heart failure, unspecified: Secondary | ICD-10-CM | POA: Insufficient documentation

## 2015-12-13 DIAGNOSIS — R55 Syncope and collapse: Secondary | ICD-10-CM | POA: Diagnosis not present

## 2015-12-13 DIAGNOSIS — I110 Hypertensive heart disease with heart failure: Secondary | ICD-10-CM | POA: Diagnosis not present

## 2015-12-13 DIAGNOSIS — F329 Major depressive disorder, single episode, unspecified: Secondary | ICD-10-CM | POA: Diagnosis not present

## 2015-12-13 DIAGNOSIS — Z825 Family history of asthma and other chronic lower respiratory diseases: Secondary | ICD-10-CM | POA: Diagnosis not present

## 2015-12-13 DIAGNOSIS — I4891 Unspecified atrial fibrillation: Secondary | ICD-10-CM | POA: Diagnosis not present

## 2015-12-13 DIAGNOSIS — I5022 Chronic systolic (congestive) heart failure: Secondary | ICD-10-CM | POA: Insufficient documentation

## 2015-12-13 DIAGNOSIS — M1990 Unspecified osteoarthritis, unspecified site: Secondary | ICD-10-CM | POA: Diagnosis not present

## 2015-12-13 DIAGNOSIS — I639 Cerebral infarction, unspecified: Secondary | ICD-10-CM

## 2015-12-13 DIAGNOSIS — R4781 Slurred speech: Secondary | ICD-10-CM | POA: Diagnosis present

## 2015-12-13 DIAGNOSIS — Z8249 Family history of ischemic heart disease and other diseases of the circulatory system: Secondary | ICD-10-CM | POA: Diagnosis not present

## 2015-12-13 LAB — CBC WITH DIFFERENTIAL/PLATELET
BASOS PCT: 1 %
Basophils Absolute: 0.1 10*3/uL (ref 0–0.1)
EOS ABS: 0.3 10*3/uL (ref 0–0.7)
EOS PCT: 4 %
HCT: 32.4 % — ABNORMAL LOW (ref 35.0–47.0)
HEMOGLOBIN: 10.6 g/dL — AB (ref 12.0–16.0)
Lymphocytes Relative: 15 %
Lymphs Abs: 1.1 10*3/uL (ref 1.0–3.6)
MCH: 29.6 pg (ref 26.0–34.0)
MCHC: 32.8 g/dL (ref 32.0–36.0)
MCV: 90.3 fL (ref 80.0–100.0)
Monocytes Absolute: 0.6 10*3/uL (ref 0.2–0.9)
Monocytes Relative: 8 %
NEUTROS PCT: 72 %
Neutro Abs: 5.4 10*3/uL (ref 1.4–6.5)
PLATELETS: 231 10*3/uL (ref 150–440)
RBC: 3.59 MIL/uL — AB (ref 3.80–5.20)
RDW: 15.7 % — ABNORMAL HIGH (ref 11.5–14.5)
WBC: 7.4 10*3/uL (ref 3.6–11.0)

## 2015-12-13 LAB — COMPREHENSIVE METABOLIC PANEL
ALBUMIN: 3.1 g/dL — AB (ref 3.5–5.0)
ALK PHOS: 50 U/L (ref 38–126)
ALT: 13 U/L — AB (ref 14–54)
ANION GAP: 7 (ref 5–15)
AST: 39 U/L (ref 15–41)
BUN: 20 mg/dL (ref 6–20)
CALCIUM: 8.7 mg/dL — AB (ref 8.9–10.3)
CHLORIDE: 110 mmol/L (ref 101–111)
CO2: 23 mmol/L (ref 22–32)
Creatinine, Ser: 1.56 mg/dL — ABNORMAL HIGH (ref 0.44–1.00)
GFR calc Af Amer: 33 mL/min — ABNORMAL LOW (ref >60)
GFR calc non Af Amer: 29 mL/min — ABNORMAL LOW (ref >60)
GLUCOSE: 112 mg/dL — AB (ref 65–99)
Potassium: 4.5 mmol/L (ref 3.5–5.1)
SODIUM: 140 mmol/L (ref 135–145)
Total Bilirubin: 0.4 mg/dL (ref 0.3–1.2)
Total Protein: 6.3 g/dL — ABNORMAL LOW (ref 6.5–8.1)

## 2015-12-13 LAB — BLOOD GAS, ARTERIAL
ALLENS TEST (PASS/FAIL): POSITIVE — AB
Acid-Base Excess: 2.3 mmol/L (ref 0.0–3.0)
Bicarbonate: 26.4 mEq/L (ref 21.0–28.0)
FIO2: 21
O2 SAT: 95.9 %
PATIENT TEMPERATURE: 37
PO2 ART: 77 mmHg — AB (ref 83.0–108.0)
pCO2 arterial: 38 mmHg (ref 32.0–48.0)
pH, Arterial: 7.45 (ref 7.350–7.450)

## 2015-12-13 LAB — PROTIME-INR
INR: 1.29
PROTHROMBIN TIME: 16.2 s — AB (ref 11.4–15.0)

## 2015-12-13 LAB — TROPONIN I: Troponin I: 0.03 ng/mL (ref <0.031)

## 2015-12-13 LAB — BRAIN NATRIURETIC PEPTIDE: B Natriuretic Peptide: 48 pg/mL (ref 0.0–100.0)

## 2015-12-13 MED ORDER — AMIODARONE HCL 200 MG PO TABS
200.0000 mg | ORAL_TABLET | Freq: Every day | ORAL | Status: DC
  Administered 2015-12-13: 200 mg via ORAL
  Filled 2015-12-13: qty 1

## 2015-12-13 NOTE — ED Notes (Signed)
Pt arrived via EMS from home for complaints of syncopal episode during a bowel movement this morning. EMS reports NSR and VSS. Pt alert on arrival.

## 2015-12-13 NOTE — ED Notes (Signed)
AAOx3.  Skin warm and dry. Moving all extremities.  D/C home.

## 2015-12-13 NOTE — Consult Note (Signed)
[facility] [place] at [facility]   PATIENT NAME: Lisa [name]    MR#:  [id number]  DATE OF BIRTH:  16-May-1927  DATE OF ADMISSION:  12/13/2015  PRIMARY CARE PHYSICIAN: BABAOFF, Lavada Mesi, MD   REQUESTING/REFERRING PHYSICIAN: Suella Broad, M.D.  CHIEF COMPLAINT:   Chief Complaint  Patient presents with  . Loss of Consciousness    HISTORY OF PRESENT ILLNESS:  Lisa [name]  is a 82 y.o. female with a known history of Hypertension, depression, was seen in consultation for some slurred speech and left-sided facial droop.  Per records, patient was having bowel movement when she had a presyncopal event had some foaming from her mouth.  Did not have any focal weakness.  Per EMS, patient was covered in a large stool.  When they went to pick up.  Patient seems quite back to her normal.  Caregiver is at bedside. PAST MEDICAL HISTORY:   Past Medical History  Diagnosis Date  . Allergy   . Pelvis fracture (HCC)   . Wrist fracture, right   . Depression   . Spinal stenosis   . Osteoarthritis   . Hypertension   . Foot fracture     bilateral  . Brain bleed (HCC)   . V-tach (HCC)   . Chronic systolic CHF (congestive heart failure) (HCC)   . Renal disorder     PAST SURGICAL HISTOIRY:   Past Surgical History  Procedure Laterality Date  . Back surgery N/A   . Breast lumpectomy N/A     SOCIAL HISTORY:   Social History  Substance Use Topics  . Smoking status: Never Smoker   . Smokeless tobacco: Never Used  . Alcohol Use: No    FAMILY HISTORY:   Family History  Problem Relation Age of Onset  . COPD Mother   . COPD Father   . Heart disease Father     DRUG ALLERGIES:   Allergies  Allergen Reactions  . Ambien [Zolpidem Tartrate] Other (See Comments)    Reaction:  Hallucinations   . Daypro [Oxaprozin] Other (See Comments)    GI upset  . Oxycodone Nausea And Vomiting  . Shellfish Allergy Swelling and Other (See Comments)    Pts mouth and face swells.    . Ace Inhibitors Cough  . Norvasc [Amlodipine Besylate] Palpitations    REVIEW OF SYSTEMS:  CONSTITUTIONAL: No fever, fatigue or weakness.  EYES: No blurred or double vision.  EARS, NOSE, AND THROAT: No tinnitus or ear pain.  RESPIRATORY: No cough, shortness of breath, wheezing or hemoptysis.  CARDIOVASCULAR: No chest pain, orthopnea, edema.  GASTROINTESTINAL: No nausea, vomiting, diarrhea or abdominal pain.  GENITOURINARY: No dysuria, hematuria.  ENDOCRINE: No polyuria, nocturia,  HEMATOLOGY: No anemia, easy bruising or bleeding SKIN: No rash or lesion. MUSCULOSKELETAL: No joint pain or arthritis.   NEUROLOGIC: No tingling, numbness, weakness.  Presyncope + PSYCHIATRY: No anxiety or depression +.   MEDICATIONS AT HOME:   Prior to Admission medications   Medication Sig Start Date End Date Taking? Authorizing Provider  acetaminophen (TYLENOL) 325 MG tablet Take 1-2 tablets (325-650 mg total) by mouth every 4 (four) hours as needed for mild pain. 09/12/15  Yes Evlyn Kanner Love, PA-C  albuterol (PROVENTIL HFA;VENTOLIN HFA) 108 (90 Base) MCG/ACT inhaler Inhale 2 puffs into the lungs every 4 (four) hours as needed for wheezing or shortness of breath (cough). 10/10/15  Yes Richardean Canal, MD  amiodarone (PACERONE) 200 MG tablet Take 1 tablet (200 mg total) by  mouth 2 (two) times daily. 12/12/15  Yes Madline Oesterling Sherryll BurgerShah, MD  citalopram (CELEXA) 20 MG tablet Take 1 tablet (20 mg total) by mouth daily. 08/22/15  Yes Richarda OverlieNayana Abrol, MD  ELIQUIS 5 MG TABS tablet Take 1 tablet by mouth 2 (two) times daily. Reported on 11/08/2015 10/04/15  Yes Historical Provider, MD  feeding supplement, ENSURE ENLIVE, (ENSURE ENLIVE) LIQD Take 237 mLs by mouth 3 (three) times daily between meals. 09/12/15  Yes Evlyn KannerPamela S Love, PA-C  guaiFENesin-dextromethorphan (ROBITUSSIN DM) 100-10 MG/5ML syrup Take 5 mLs by mouth every 4 (four) hours as needed for cough. 11/21/15  Yes Wyatt Hasteavid K Hower, MD  irbesartan (AVAPRO) 300 MG tablet Take 150 mg by  mouth daily. 10/05/15  Yes Historical Provider, MD  mirtazapine (REMERON) 15 MG tablet Take 1 tablet by mouth daily. 10/04/15  Yes Historical Provider, MD  omeprazole (PRILOSEC) 20 MG capsule Take 1 capsule by mouth daily. 10/05/15  Yes Historical Provider, MD  pantoprazole (PROTONIX) 20 MG tablet Take 1 tablet (20 mg total) by mouth 2 (two) times daily. 09/12/15  Yes Evlyn KannerPamela S Love, PA-C  potassium chloride SA (K-DUR,KLOR-CON) 20 MEQ tablet Take 1 tablet (20 mEq total) by mouth daily. 09/12/15  Yes Evlyn KannerPamela S Love, PA-C  QUEtiapine (SEROQUEL) 50 MG tablet Take 1 tablet (50 mg total) by mouth at bedtime. 09/12/15  Yes Evlyn KannerPamela S Love, PA-C  raloxifene (EVISTA) 60 MG tablet Take 1 tablet by mouth daily. 10/05/15  Yes Historical Provider, MD  senna-docusate (SENOKOT-S) 8.6-50 MG tablet Take 2 tablets by mouth 2 (two) times daily. 09/12/15  Yes Evlyn KannerPamela S Love, PA-C      VITAL SIGNS:  Blood pressure 133/58, pulse 59, temperature 98 F (36.7 C), temperature source Oral, resp. rate 16, height 5\' 5"  (1.651 m), weight 66.764 kg (147 lb 3 oz), SpO2 99 %.  PHYSICAL EXAMINATION:  GENERAL:  82 y.o.-year-old patient lying in the bed with no acute distress.  EYES: Pupils equal, round, reactive to light and accommodation. No scleral icterus. Extraocular muscles intact.  HEENT: Head atraumatic, normocephalic. Oropharynx and nasopharynx clear.  NECK:  Supple, no jugular venous distention. No thyroid enlargement, no tenderness.  LUNGS: Normal breath sounds bilaterally, no wheezing, rales,rhonchi or crepitation. No use of accessory muscles of respiration.  CARDIOVASCULAR: S1, S2 normal. No murmurs, rubs, or gallops.  ABDOMEN: Soft, nontender, nondistended. Bowel sounds present. No organomegaly or mass.  EXTREMITIES: No pedal edema, cyanosis, or clubbing.  NEUROLOGIC: Cranial nerves II through XII are intact. Muscle strength 5/5 in all extremities. Sensation intact. Gait not checked.  PSYCHIATRIC: The patient is alert  and oriented x 3.  SKIN: No obvious rash, lesion, or ulcer.  LABORATORY PANEL:   CBC  Recent Labs Lab 12/13/15 1056  WBC 7.4  HGB 10.6*  HCT 32.4*  PLT 231   ------------------------------------------------------------------------------------------------------------------  Chemistries   Recent Labs Lab 12/13/15 1056  NA 140  K 4.5  CL 110  CO2 23  GLUCOSE 112*  BUN 20  CREATININE 1.56*  CALCIUM 8.7*  AST 39  ALT 13*  ALKPHOS 50  BILITOT 0.4   ------------------------------------------------------------------------------------------------------------------  Cardiac Enzymes  Recent Labs Lab 12/13/15 1056  TROPONINI 0.03   ------------------------------------------------------------------------------------------------------------------  RADIOLOGY:  Dg Chest 1 View  12/13/2015  CLINICAL DATA:  Left facial droop, dizziness, syncope EXAM: CHEST 1 VIEW COMPARISON:  Chest x-ray of 11/21/2015 FINDINGS: No active infiltrate or effusion is seen. Probable chronic changes in the upper lobes to the apices appears stable with pleural-parenchymal scarring present. Mediastinal  and hilar contours are unchanged. Cardiomegaly is stable. No acute bony abnormality is noted. IMPRESSION: Stable cardiomegaly and chronic change in the lung apices. No active lung disease. Electronically Signed   By: Dwyane Dee M.D.   On: 12/13/2015 11:50   Ct Head Wo Contrast  12/13/2015  CLINICAL DATA:  Left facial droop, syncope Um a dizziness EXAM: CT HEAD WITHOUT CONTRAST TECHNIQUE: Contiguous axial images were obtained from the base of the skull through the vertex without intravenous contrast. COMPARISON:  MR brain of 12/12/2015 and CT brain of 12/11/2015 FINDINGS: The ventricular system remains prominent as are the cortical sulci and cerebellar folia consistent with diffuse atrophy. The septum is midline in position. Low-attenuation bifrontally is consistent with encephalomalacia apparently due to prior  trauma compared to prior dictated reports. No hemorrhage, mass lesion, or acute infarction is seen. On bone window images changes of right maxillary sinusitis are noted with mucosal thickening and air-fluid level. No calvarial abnormality is seen. IMPRESSION: 1. No acute intracranial abnormality. Bifrontal encephalomalacia is stable. 2. Again changes of right maxillary sinusitis are noted. Electronically Signed   By: Dwyane Dee M.D.   On: 12/13/2015 11:55   Ct Head Wo Contrast  12/11/2015  CLINICAL DATA:  Altered mental status. Headache. Difficulty with speech EXAM: CT HEAD WITHOUT CONTRAST TECHNIQUE: Contiguous axial images were obtained from the base of the skull through the vertex without intravenous contrast. COMPARISON:  CT head 12/09/2015 FINDINGS: Chronic encephalomalacia in the frontal lobes bilaterally due to chronic head trauma. This is stable. Mild chronic microvascular ischemic change in the white matter. Mild atrophy.  Mild ventricular prominence unchanged Negative for acute infarct.  Negative for acute hemorrhage or mass Air-fluid level right maxillary sinus unchanged. Negative for skull fracture IMPRESSION: Chronic encephalomalacia left frontal lobes bilaterally due to prior head trauma. Atrophy and chronic microvascular ischemia.  No acute abnormality. Air-fluid level right maxillary sinus unchanged. Electronically Signed   By: Marlan Palau M.D.   On: 12/11/2015 18:08   Mr Brain Wo Contrast  12/12/2015  CLINICAL DATA:  82 year old female with acute headache. Initial encounter. Personal history of previous head trauma. EXAM: MRI HEAD WITHOUT CONTRAST MRA HEAD WITHOUT CONTRAST TECHNIQUE: Multiplanar, multiecho pulse sequences of the brain and surrounding structures were obtained without intravenous contrast. Angiographic images of the head were obtained using MRA technique without contrast. COMPARISON:  Head CT without contrast 12/11/2015 and earlier. FINDINGS: MRI HEAD FINDINGS Chronic  inferior bifrontal encephalomalacia. Associated hemosiderin and chronic blood products in the left inferior frontal gyrus, causing susceptibility artifact on diffusion weighted imaging in that region. No restricted diffusion to suggest acute infarction. No midline shift, mass effect, evidence of mass lesion, ventriculomegaly, extra-axial collection or acute intracranial hemorrhage. Cervicomedullary junction and pituitary are within normal limits. Major intracranial vascular flow voids are preserved. Additional cortical encephalomalacia in the lateral temporal lobes, greater on the right. Mild superficial siderosis along the left operculum. Superimposed patchy mostly periventricular cerebral white matter T2 and FLAIR hyperintensity. Deep gray matter nuclei, brainstem, and cerebellum are within normal limits for age. Visible internal auditory structures appear normal. Trace mastoid fluid. Mild paranasal sinus mucosal thickening, most pronounced in the right maxillary sinus. Postoperative changes to both globes. Otherwise negative orbit and scalp soft tissues. Multilevel cervical spine disc degeneration. Visualized bone marrow signal is within normal limits. MRA HEAD FINDINGS Antegrade flow in the posterior circulation, with MOTSA artifact suspected in the bilateral vertebral artery V4 segments. Both PICA origins appear normal. Basilar artery is normal. SCA  and PCA origins are normal. Posterior communicating arteries are diminutive or absent. Bilateral PCA branches are within normal limits. Tortuous distal cervical ICA on the right. Antegrade flow in both ICA siphons. Cavernous and supraclinoid segment irregularity with up to mild proximal supraclinoid stenosis, greater on the left. Ophthalmic artery origins remain normal. Patent carotid termini. Normal MCA and ACA origins. Anterior communicating artery and visualized bilateral ACA branches are within normal limits. Left MCA M1 segments and MCA bifurcations are patent.  Visualized bilateral MCA branches are within normal limits, some the MOTSA artifact noted. IMPRESSION: 1.  No acute intracranial abnormality. 2. Posttraumatic changes to the brain with bifrontal and bitemporal encephalomalacia. Left frontal lobe hemosiderin and mild left operculum superficial siderosis. 3. Intracranial MRA mildly degraded by artifact. Intracranial atherosclerosis with mild ICA siphon stenosis greater on the left. Electronically Signed   By: Odessa Fleming M.D.   On: 12/12/2015 13:29   Mr Maxine Glenn Head/brain Wo Cm  12/12/2015  CLINICAL DATA:  82 year old female with acute headache. Initial encounter. Personal history of previous head trauma. EXAM: MRI HEAD WITHOUT CONTRAST MRA HEAD WITHOUT CONTRAST TECHNIQUE: Multiplanar, multiecho pulse sequences of the brain and surrounding structures were obtained without intravenous contrast. Angiographic images of the head were obtained using MRA technique without contrast. COMPARISON:  Head CT without contrast 12/11/2015 and earlier. FINDINGS: MRI HEAD FINDINGS Chronic inferior bifrontal encephalomalacia. Associated hemosiderin and chronic blood products in the left inferior frontal gyrus, causing susceptibility artifact on diffusion weighted imaging in that region. No restricted diffusion to suggest acute infarction. No midline shift, mass effect, evidence of mass lesion, ventriculomegaly, extra-axial collection or acute intracranial hemorrhage. Cervicomedullary junction and pituitary are within normal limits. Major intracranial vascular flow voids are preserved. Additional cortical encephalomalacia in the lateral temporal lobes, greater on the right. Mild superficial siderosis along the left operculum. Superimposed patchy mostly periventricular cerebral white matter T2 and FLAIR hyperintensity. Deep gray matter nuclei, brainstem, and cerebellum are within normal limits for age. Visible internal auditory structures appear normal. Trace mastoid fluid. Mild paranasal  sinus mucosal thickening, most pronounced in the right maxillary sinus. Postoperative changes to both globes. Otherwise negative orbit and scalp soft tissues. Multilevel cervical spine disc degeneration. Visualized bone marrow signal is within normal limits. MRA HEAD FINDINGS Antegrade flow in the posterior circulation, with MOTSA artifact suspected in the bilateral vertebral artery V4 segments. Both PICA origins appear normal. Basilar artery is normal. SCA and PCA origins are normal. Posterior communicating arteries are diminutive or absent. Bilateral PCA branches are within normal limits. Tortuous distal cervical ICA on the right. Antegrade flow in both ICA siphons. Cavernous and supraclinoid segment irregularity with up to mild proximal supraclinoid stenosis, greater on the left. Ophthalmic artery origins remain normal. Patent carotid termini. Normal MCA and ACA origins. Anterior communicating artery and visualized bilateral ACA branches are within normal limits. Left MCA M1 segments and MCA bifurcations are patent. Visualized bilateral MCA branches are within normal limits, some the MOTSA artifact noted. IMPRESSION: 1.  No acute intracranial abnormality. 2. Posttraumatic changes to the brain with bifrontal and bitemporal encephalomalacia. Left frontal lobe hemosiderin and mild left operculum superficial siderosis. 3. Intracranial MRA mildly degraded by artifact. Intracranial atherosclerosis with mild ICA siphon stenosis greater on the left. Electronically Signed   By: Odessa Fleming M.D.   On: 12/12/2015 13:29    EKG:   Orders placed or performed during the hospital encounter of 12/13/15  . EKG 12-Lead  . EKG 12-Lead  . EKG 12-Lead  .  EKG 12-Lead    IMPRESSION AND PLAN:  82 year old female who was just discharged from the hospital yesterday by me, seen in consultation for possible slurred speech, facial droop and possible presyncope  * Presyncope: Sounds more vasovagal in nature, she was started on  low-dose amiodarone, which certainly could be contributing, this was started as a response to her atrial fibrillation.  She seems back to normal at this point.  There was also concern for maybe overuse of narcotics, which had been stopped on last discharge yesterday.  Doubt this is withdrawal.  She is also on Xanax 3 times a day as needed, which I have instructed to start tapering as felt appropriate   I had a long session with patient's son, Link Snuffer @ [phone].  He understands and is agreeable with discharge back to her facility with outpatient follow-up with neurology as already scheduled and her primary care physician.   All the records are reviewed and case discussed with Consulting provider. Management plans discussed with the patient, family and they are in agreement.  CODE STATUS: FULL CODE  TOTAL TIME TAKING CARE OF THIS PATIENT: 55 minutes.    [facility], Torunn Chancellor M.D on 12/13/2015 at 4:53 PM  Between 7am to 6pm - Pager - [phone]  After 6pm go to [email or web address] - password [email or web address] [facility]  [facility]  Office  [phone]  CC: Primary care Physician: Rozanna Box, MD   Note: This dictation was prepared with Dragon dictation along with smaller phrase technology. Any transcriptional errors that result from this process are unintentional.

## 2015-12-13 NOTE — ED Notes (Signed)
[facility]  [phone]

## 2015-12-13 NOTE — ED Notes (Signed)
Patient noted to be in Afib with RVR rate 110/125.  Dr. Sherryll BurgerShah notified, ordered to give patient evening dose of Amiodarone.  Patient remains AAOx3.  Skin warm and dry.  NAD.

## 2015-12-13 NOTE — ED Provider Notes (Signed)
[facility] Emergency Department Provider Note ____________________________________________  Time seen: Approximately 10:30 AM  I have reviewed the triage vital signs and the nursing notes.   HISTORY  Chief Complaint Loss of Consciousness  HPI Lisa [name] is a 82 y.o. female who was actually just discharged from the hospital yesterday, and the caregiver went in to check on her this morning, patient was having slurred speech, with left facial droop, and then when she sat her down to have a bowel movement she had a syncopal event. The patient states that she just felt real dizzy at that time. EMS stated that the caregiver was a poor historian and Changing her story, as well as there was a language barrier. There was never an established last known normal time per EMS, and so it was felt that the patient woke like this. Patient denies any significant headache, but states she is still dizzy at this time. Patient denies any numbness tingling or weakness to extremities though she is not moving her bilateral legs well. Patient states that she normally walks with a walker. EMS also stated the patient was still covered in a large amount of stool when they arrived to pick her up.   Past Medical History  Diagnosis Date  . Allergy   . Pelvis fracture (HCC)   . Wrist fracture, right   . Depression   . Spinal stenosis   . Osteoarthritis   . Hypertension   . Foot fracture     bilateral  . Brain bleed (HCC)   . V-tach (HCC)   . Chronic systolic CHF (congestive heart failure) (HCC)   . Renal disorder     Patient Active Problem List   Diagnosis Date Noted  . New onset atrial fibrillation (HCC) 12/11/2015  . Altered mental state   . Congestive dilated cardiomyopathy (HCC)   . Dementia   . Headache 12/10/2015  . TIA (transient ischemic attack) 12/10/2015  . Influenza A 11/21/2015  . Ventricular tachycardia (HCC) 11/09/2015  . Constipation 09/12/2015  . Insomnia due to  anxiety and fear 09/12/2015  . Depression, controlled 09/12/2015  . Bacterial UTI 08/27/2015  . DVT, lower extremity (HCC) 08/27/2015  . TBI (traumatic brain injury) (HCC) 08/22/2015  . ICH (intracerebral hemorrhage) (HCC)   . Abscess   . Hypomagnesemia   . SDH (subdural hematoma) (HCC)   . Boil of upper extremity   . Pneumonia due to Haemophilus influenzae (HCC)   . Chronic systolic CHF (congestive heart failure) (HCC)   . Acute on chronic renal failure (HCC)   . Hypokalemia   . Bright red blood per rectum   . Dysphagia   . Pressure ulcer 08/16/2015  . Acute respiratory failure with hypoxemia (HCC)   . Hypernatremia   . Subdural hematoma (HCC) 08/03/2015  . Lumbar radiculopathy 06/06/2015  . Spinal stenosis, lumbar region, with neurogenic claudication 04/02/2015  . Degenerative lumbar disc 02/15/2015  . Lumbar post-laminectomy syndrome 02/15/2015  . Facet syndrome, lumbar 02/15/2015  . Sacroiliac joint dysfunction 02/15/2015    Past Surgical History  Procedure Laterality Date  . Back surgery N/A   . Breast lumpectomy N/A     Current Outpatient Rx  Name  Route  Sig  Dispense  Refill  . acetaminophen (TYLENOL) 325 MG tablet   Oral   Take 1-2 tablets (325-650 mg total) by mouth every 4 (four) hours as needed for mild pain.         [name] albuterol (PROVENTIL HFA;VENTOLIN HFA) 108 (  90 Base) MCG/ACT inhaler   Inhalation   Inhale 2 puffs into the lungs every 4 (four) hours as needed for wheezing or shortness of breath (cough).   1 Inhaler   0   . amiodarone (PACERONE) 200 MG tablet   Oral   Take 1 tablet (200 mg total) by mouth 2 (two) times daily.   30 tablet   0   . citalopram (CELEXA) 20 MG tablet   Oral   Take 1 tablet (20 mg total) by mouth daily.   30 tablet   1   . ELIQUIS 5 MG TABS tablet   Oral   Take 1 tablet by mouth 2 (two) times daily. Reported on 11/08/2015      0     Dispense as written.   . feeding supplement, ENSURE ENLIVE, (ENSURE ENLIVE)  LIQD   Oral   Take 237 mLs by mouth 3 (three) times daily between meals.   237 mL   12   . guaiFENesin-dextromethorphan (ROBITUSSIN DM) 100-10 MG/5ML syrup   Oral   Take 5 mLs by mouth every 4 (four) hours as needed for cough.   118 mL   0   . irbesartan (AVAPRO) 300 MG tablet   Oral   Take 150 mg by mouth daily.      3   . mirtazapine (REMERON) 15 MG tablet   Oral   Take 1 tablet by mouth daily.      0   . omeprazole (PRILOSEC) 20 MG capsule   Oral   Take 1 capsule by mouth daily.      10   . pantoprazole (PROTONIX) 20 MG tablet   Oral   Take 1 tablet (20 mg total) by mouth 2 (two) times daily.         . potassium chloride SA (K-DUR,KLOR-CON) 20 MEQ tablet   Oral   Take 1 tablet (20 mEq total) by mouth daily.         . QUEtiapine (SEROQUEL) 50 MG tablet   Oral   Take 1 tablet (50 mg total) by mouth at bedtime.   7 tablet   0   . raloxifene (EVISTA) 60 MG tablet   Oral   Take 1 tablet by mouth daily.         [name] senna-docusate (SENOKOT-S) 8.6-50 MG tablet   Oral   Take 2 tablets by mouth 2 (two) times daily.           Allergies Ambien; Daypro; Oxycodone; Shellfish allergy; Ace inhibitors; and Norvasc  Family History  Problem Relation Age of Onset  . COPD Mother   . COPD Father   . Heart disease Father     Social History Social History  Substance Use Topics  . Smoking status: Never Smoker   . Smokeless tobacco: Never Used  . Alcohol Use: No    Review of Systems  Constitutional: No fever/chills Eyes: No visual changes. ENT: No sore throat. Cardiovascular: Denies chest pain. Respiratory: She still has a cough and some mild shortness of breath since being discharged from the hospital yesterday. Gastrointestinal: No abdominal pain.  No nausea, no vomiting.  No diarrhea.  No constipation. Genitourinary: Negative for dysuria. Musculoskeletal: Negative for back pain. Skin: Negative for rash. Neurological: Negative for headaches, but  patient with dizziness, left facial droop, and clear slurred speech of unknown duration. 10-point ROS otherwise negative.  ____________________________________________   PHYSICAL EXAM:  VITAL SIGNS: ED Triage Vitals  Enc Vitals Group  BP 12/13/15 1036 152/74 mmHg     Pulse Rate 12/13/15 1036 96     Resp 12/13/15 1036 18     Temp 12/13/15 1036 98.2 F (36.8 C)     Temp Source 12/13/15 1036 Oral     SpO2 12/13/15 1036 96 %     Weight 12/13/15 1036 147 lb 3 oz (66.764 kg)     Height 12/13/15 1036 5\' 5"  (1.651 m)     Head Cir --      Peak Flow --      Pain Score --      Pain Loc --      Pain Edu? --      Excl. in GC? --     Constitutional: Alert and oriented. Well appearing and in no acute distress. Eyes: Conjunctivae are normal. PERRL. EOMI. no nystagmus Head: Atraumatic. Nose: No congestion/rhinnorhea. Mouth/Throat: Mucous membranes are moist.  Oropharynx non-erythematous. Neck: No stridor.   Cardiovascular: Normal rate, regular rhythm. Grossly normal heart sounds.  Good peripheral circulation. Respiratory: Normal respiratory effort.  No retractions. Patient with a deep congested cough and some mild scattered wheezing throughout Gastrointestinal: Soft and nontender. No distention. No abdominal bruits. No CVA tenderness. Musculoskeletal: No lower extremity tenderness nor edema.  No joint effusions. Neurologic:  Patient has some mild slurred speech, she does have left facial droop, she has good strength in her bilateral upper arms but very weak in her bilateral lower legs. Does not appear to have any sensation deficits. Patient is somewhat confused on what occurred at home. Patient though does state that she still feels dizzy. There is no nystagmus noted. Skin:  Skin is warm, dry and intact. No rash noted. Psychiatric: Mood and affect are normal. Speech and behavior are normal.  ____________________________________________   LABS (all labs ordered are listed, but only  abnormal results are displayed)  Labs Reviewed  CBC WITH DIFFERENTIAL/PLATELET - Abnormal; Notable for the following:    RBC 3.59 (*)    Hemoglobin 10.6 (*)    HCT 32.4 (*)    RDW 15.7 (*)    All other components within normal limits  COMPREHENSIVE METABOLIC PANEL - Abnormal; Notable for the following:    Glucose, Bld 112 (*)    Creatinine, Ser 1.56 (*)    Calcium 8.7 (*)    Total Protein 6.3 (*)    Albumin 3.1 (*)    ALT 13 (*)    GFR calc non Af Amer 29 (*)    GFR calc Af Amer 33 (*)    All other components within normal limits  PROTIME-INR - Abnormal; Notable for the following:    Prothrombin Time 16.2 (*)    All other components within normal limits  BLOOD GAS, ARTERIAL - Abnormal; Notable for the following:    pO2, Arterial 77 (*)    Allens test (pass/fail) POSITIVE (*)    All other components within normal limits  TROPONIN I  BRAIN NATRIURETIC PEPTIDE  URINALYSIS COMPLETEWITH MICROSCOPIC ([facility] ONLY)   ____________________________________________  EKG ED ECG REPORT I, Leona Carry, the attending physician, personally viewed and interpreted this ECG.   Date: 12/13/2015  EKG Time: 10:42 AM  Rate: 93  Rhythm: normal EKG, normal sinus rhythm, unchanged from previous tracings, normal sinus rhythm, LBBB  Axis: Left axis deviation  Intervals:left bundle branch block  ST&T Change: Nonspecific ST and T-wave changes associated with bundle branch block  ____________________________________________  RADIOLOGY Dg Chest 1 View  12/13/2015  CLINICAL DATA:  Left facial droop, dizziness, syncope EXAM: CHEST 1 VIEW COMPARISON:  Chest x-ray of 11/21/2015 FINDINGS: No active infiltrate or effusion is seen. Probable chronic changes in the upper lobes to the apices appears stable with pleural-parenchymal scarring present. Mediastinal and hilar contours are unchanged. Cardiomegaly is stable. No acute bony abnormality is noted. IMPRESSION: Stable cardiomegaly and chronic change in  the lung apices. No active lung disease. Electronically Signed   By: Dwyane DeePaul  Barry M.D.   On: 12/13/2015 11:50   Ct Head Wo Contrast  12/13/2015  CLINICAL DATA:  Left facial droop, syncope Um a dizziness EXAM: CT HEAD WITHOUT CONTRAST TECHNIQUE: Contiguous axial images were obtained from the base of the skull through the vertex without intravenous contrast. COMPARISON:  MR brain of 12/12/2015 and CT brain of 12/11/2015 FINDINGS: The ventricular system remains prominent as are the cortical sulci and cerebellar folia consistent with diffuse atrophy. The septum is midline in position. Low-attenuation bifrontally is consistent with encephalomalacia apparently due to prior trauma compared to prior dictated reports. No hemorrhage, mass lesion, or acute infarction is seen. On bone window images changes of right maxillary sinusitis are noted with mucosal thickening and air-fluid level. No calvarial abnormality is seen. IMPRESSION: 1. No acute intracranial abnormality. Bifrontal encephalomalacia is stable. 2. Again changes of right maxillary sinusitis are noted. Electronically Signed   By: Dwyane DeePaul  Barry M.D.   On: 12/13/2015 11:55   Ct Head Wo Contrast  12/11/2015  CLINICAL DATA:  Altered mental status. Headache. Difficulty with speech EXAM: CT HEAD WITHOUT CONTRAST TECHNIQUE: Contiguous axial images were obtained from the base of the skull through the vertex without intravenous contrast. COMPARISON:  CT head 12/09/2015 FINDINGS: Chronic encephalomalacia in the frontal lobes bilaterally due to chronic head trauma. This is stable. Mild chronic microvascular ischemic change in the white matter. Mild atrophy.  Mild ventricular prominence unchanged Negative for acute infarct.  Negative for acute hemorrhage or mass Air-fluid level right maxillary sinus unchanged. Negative for skull fracture IMPRESSION: Chronic encephalomalacia left frontal lobes bilaterally due to prior head trauma. Atrophy and chronic microvascular ischemia.   No acute abnormality. Air-fluid level right maxillary sinus unchanged. Electronically Signed   By: Marlan Palauharles  Clark M.D.   On: 12/11/2015 18:08   Ct Head Wo Contrast  12/09/2015  CLINICAL DATA:  Severe headache at 4 p.m. today. History of head injury with intracranial hemorrhage on 11/16. EXAM: CT HEAD WITHOUT CONTRAST TECHNIQUE: Contiguous axial images were obtained from the base of the skull through the vertex without intravenous contrast. COMPARISON:  08/30/2015 FINDINGS: Mild diffuse cerebral atrophy. Mild ventricular dilatation consistent with central atrophy. Low-attenuation changes in the deep white matter consistent with small vessel ischemia. Low-attenuation areas in both anterior frontal lobes consistent with old areas of encephalomalacia likely posttraumatic or ischemic. These areas have become better defined since previous study suggesting typical evolution of previous acute change. No mass effect or midline shift. No sulcal effacement. Gray-white matter junctions are distinct. Basal cisterns are not effaced. No abnormal extra-axial fluid collections. No acute intracranial hemorrhage. Calvarium appears intact. Air-fluid levels in the maxillary antra bilaterally may indicate sinusitis. Mastoid air cells are not opacified. Vascular calcifications. IMPRESSION: No acute intracranial abnormalities. Chronic atrophy and small vessel ischemic changes bilaterally with old encephalomalacia in the anterior frontal lobes. Air-fluid levels in the paranasal sinuses may indicate acute sinusitis. Electronically Signed   By: Burman NievesWilliam  Stevens M.D.   On: 12/09/2015 20:38   Mr Brain Wo Contrast  12/12/2015  CLINICAL DATA:  82 year old female  with acute headache. Initial encounter. Personal history of previous head trauma. EXAM: MRI HEAD WITHOUT CONTRAST MRA HEAD WITHOUT CONTRAST TECHNIQUE: Multiplanar, multiecho pulse sequences of the brain and surrounding structures were obtained without intravenous contrast.  Angiographic images of the head were obtained using MRA technique without contrast. COMPARISON:  Head CT without contrast 12/11/2015 and earlier. FINDINGS: MRI HEAD FINDINGS Chronic inferior bifrontal encephalomalacia. Associated hemosiderin and chronic blood products in the left inferior frontal gyrus, causing susceptibility artifact on diffusion weighted imaging in that region. No restricted diffusion to suggest acute infarction. No midline shift, mass effect, evidence of mass lesion, ventriculomegaly, extra-axial collection or acute intracranial hemorrhage. Cervicomedullary junction and pituitary are within normal limits. Major intracranial vascular flow voids are preserved. Additional cortical encephalomalacia in the lateral temporal lobes, greater on the right. Mild superficial siderosis along the left operculum. Superimposed patchy mostly periventricular cerebral white matter T2 and FLAIR hyperintensity. Deep gray matter nuclei, brainstem, and cerebellum are within normal limits for age. Visible internal auditory structures appear normal. Trace mastoid fluid. Mild paranasal sinus mucosal thickening, most pronounced in the right maxillary sinus. Postoperative changes to both globes. Otherwise negative orbit and scalp soft tissues. Multilevel cervical spine disc degeneration. Visualized bone marrow signal is within normal limits. MRA HEAD FINDINGS Antegrade flow in the posterior circulation, with MOTSA artifact suspected in the bilateral vertebral artery V4 segments. Both PICA origins appear normal. Basilar artery is normal. SCA and PCA origins are normal. Posterior communicating arteries are diminutive or absent. Bilateral PCA branches are within normal limits. Tortuous distal cervical ICA on the right. Antegrade flow in both ICA siphons. Cavernous and supraclinoid segment irregularity with up to mild proximal supraclinoid stenosis, greater on the left. Ophthalmic artery origins remain normal. Patent carotid  termini. Normal MCA and ACA origins. Anterior communicating artery and visualized bilateral ACA branches are within normal limits. Left MCA M1 segments and MCA bifurcations are patent. Visualized bilateral MCA branches are within normal limits, some the MOTSA artifact noted. IMPRESSION: 1.  No acute intracranial abnormality. 2. Posttraumatic changes to the brain with bifrontal and bitemporal encephalomalacia. Left frontal lobe hemosiderin and mild left operculum superficial siderosis. 3. Intracranial MRA mildly degraded by artifact. Intracranial atherosclerosis with mild ICA siphon stenosis greater on the left. Electronically Signed   By: Odessa Fleming M.D.   On: 12/12/2015 13:29   Dg Chest Port 1 View  11/21/2015  CLINICAL DATA:  82 year old female with cough and fever EXAM: PORTABLE CHEST 1 VIEW COMPARISON:  Radiograph dated 10/23/2015 FINDINGS: The heart size and mediastinal contours are within normal limits. Both lungs are clear. The visualized skeletal structures are unremarkable. IMPRESSION: No active disease. Electronically Signed   By: Elgie Collard M.D.   On: 11/21/2015 02:06   Mr Maxine Glenn Head/brain Wo Cm  12/12/2015  CLINICAL DATA:  82 year old female with acute headache. Initial encounter. Personal history of previous head trauma. EXAM: MRI HEAD WITHOUT CONTRAST MRA HEAD WITHOUT CONTRAST TECHNIQUE: Multiplanar, multiecho pulse sequences of the brain and surrounding structures were obtained without intravenous contrast. Angiographic images of the head were obtained using MRA technique without contrast. COMPARISON:  Head CT without contrast 12/11/2015 and earlier. FINDINGS: MRI HEAD FINDINGS Chronic inferior bifrontal encephalomalacia. Associated hemosiderin and chronic blood products in the left inferior frontal gyrus, causing susceptibility artifact on diffusion weighted imaging in that region. No restricted diffusion to suggest acute infarction. No midline shift, mass effect, evidence of mass lesion,  ventriculomegaly, extra-axial collection or acute intracranial hemorrhage. Cervicomedullary junction and pituitary are  within normal limits. Major intracranial vascular flow voids are preserved. Additional cortical encephalomalacia in the lateral temporal lobes, greater on the right. Mild superficial siderosis along the left operculum. Superimposed patchy mostly periventricular cerebral white matter T2 and FLAIR hyperintensity. Deep gray matter nuclei, brainstem, and cerebellum are within normal limits for age. Visible internal auditory structures appear normal. Trace mastoid fluid. Mild paranasal sinus mucosal thickening, most pronounced in the right maxillary sinus. Postoperative changes to both globes. Otherwise negative orbit and scalp soft tissues. Multilevel cervical spine disc degeneration. Visualized bone marrow signal is within normal limits. MRA HEAD FINDINGS Antegrade flow in the posterior circulation, with MOTSA artifact suspected in the bilateral vertebral artery V4 segments. Both PICA origins appear normal. Basilar artery is normal. SCA and PCA origins are normal. Posterior communicating arteries are diminutive or absent. Bilateral PCA branches are within normal limits. Tortuous distal cervical ICA on the right. Antegrade flow in both ICA siphons. Cavernous and supraclinoid segment irregularity with up to mild proximal supraclinoid stenosis, greater on the left. Ophthalmic artery origins remain normal. Patent carotid termini. Normal MCA and ACA origins. Anterior communicating artery and visualized bilateral ACA branches are within normal limits. Left MCA M1 segments and MCA bifurcations are patent. Visualized bilateral MCA branches are within normal limits, some the MOTSA artifact noted. IMPRESSION: 1.  No acute intracranial abnormality. 2. Posttraumatic changes to the brain with bifrontal and bitemporal encephalomalacia. Left frontal lobe hemosiderin and mild left operculum superficial siderosis. 3.  Intracranial MRA mildly degraded by artifact. Intracranial atherosclerosis with mild ICA siphon stenosis greater on the left. Electronically Signed   By: Odessa Fleming M.D.   On: 12/12/2015 13:29    ____________________________________________   PROCEDURES  Procedure(s) performed: None  Critical Care performed: No  ____________________________________________   INITIAL IMPRESSION / ASSESSMENT AND PLAN / ED COURSE  Pertinent labs & imaging results that were available during my care of the patient were reviewed by me and considered in my medical decision making (see chart for details).  Patient to get routine labs including CT head and chest x-ray at this time. We did not call a code stroke secondary to patient does not have an actual last time well known time.  3:26 PM Pt had another episode of worsening slurred speech about 30 minutes ago.  We discussed this with patient's family and they had requested that we transfer the patient to [facility]. Apparently patient had been seen at [facility] recently and his son wanted her to continue her care there. We have consult with the hospitalist at [facility] for possible transfer.  3:26 PM Patient will get a swallowing evaluation at this time. I discussed extensively with family and caregiver that we were unsuccessful and transferring the [facility] to [facility] to there are no beds available and will probably not be available for the next 24-48 hours. I told the family the patient needed to be admitted here for at least tonight and they can continue to work on transfer if they so request while patient is in the hospital. ____________________________________________   FINAL CLINICAL IMPRESSION(S) / ED DIAGNOSES  Final diagnoses:  Slurred speech  Facial droop  Cerebral infarction due to unspecified mechanism      Leona Carry, MD 12/13/15 1526

## 2015-12-14 NOTE — Discharge Summary (Signed)
[facility] [place] at [facility]   PATIENT NAME: Lisa [name]    MR#:  [id number]  DATE OF BIRTH:  1926/12/04  DATE OF ADMISSION:  12/09/2015 ADMITTING PHYSICIAN: Ihor Austin, MD  DATE OF DISCHARGE: 12/12/2015  3:48 PM  PRIMARY CARE PHYSICIAN: BABAOFF, MARC E, MD    ADMISSION DIAGNOSIS:  Transient cerebral ischemia, unspecified transient cerebral ischemia type [G45.9]  DISCHARGE DIAGNOSIS:  Principal Problem:   TIA (transient ischemic attack) Active Problems:   Headache   New onset atrial fibrillation (HCC)   Altered mental state   Congestive dilated cardiomyopathy (HCC)   Dementia   SECONDARY DIAGNOSIS:   Past Medical History  Diagnosis Date  . Allergy   . Pelvis fracture (HCC)   . Wrist fracture, right   . Depression   . Spinal stenosis   . Osteoarthritis   . Hypertension   . Foot fracture     bilateral  . Brain bleed (HCC)   . V-tach (HCC)   . Chronic systolic CHF (congestive heart failure) (HCC)   . Renal disorder     HOSPITAL COURSE:  82 year old elderly female patient with history of hypertension, ventricular tachycardia, congestive heart failure, cerebral hemorrhage resented to the emergency room with headache and the numbness of the left side of the face and difficulty speech. Please see Dr Pyreddy's dictated H & P for further details.  1. Transient ischemic attack: ruled out. just recently had echocardiogram (on 2/27 showing EF 35-40%) so not repeated. Appreciate Neurology consultation, continue eliquis. PT/OT recommends HHPT with 24 hr supervision. She had 2 CT head and MRI brain which were all negative for any acute patho. No CVA. 2. New onset a.fib: cardio started amio, already on elqiuis. 3. Hypertension: better controlled 4. Congestive heart failure: chronic systolic, well compensated at this time. 5. Headache: resolved  In regards to her slurred speech - after long d/w facility (care giver director E[facility] Hashimoto) and son -  concerns were raised that she may be overusing her Narcotics (was found to have 100 pills of Norco/Percocet in her closet), she is also on Xanax. This is certainly a possibility and I've stopped narcotics (patient and son in agreement).  She is being D/C in stable condition. DISCHARGE CONDITIONS:   stable  CONSULTS OBTAINED:  Treatment Team:  Thana Farr, MD Antonieta Iba, MD  DRUG ALLERGIES:   Allergies  Allergen Reactions  . Ambien [Zolpidem Tartrate] Other (See Comments)    Reaction:  Hallucinations   . Daypro [Oxaprozin] Other (See Comments)    GI upset  . Oxycodone Nausea And Vomiting  . Shellfish Allergy Swelling and Other (See Comments)    Pts mouth and face swells.   . Ace Inhibitors Cough  . Norvasc [Amlodipine Besylate] Palpitations    DISCHARGE MEDICATIONS:   Discharge Medication List as of 12/12/2015  3:26 PM    START taking these medications   Details  amiodarone (PACERONE) 200 MG tablet Take 1 tablet (200 mg total) by mouth 2 (two) times daily., Starting 12/12/2015, Until Discontinued, Normal      CONTINUE these medications which have NOT CHANGED   Details  acetaminophen (TYLENOL) 325 MG tablet Take 1-2 tablets (325-650 mg total) by mouth every 4 (four) hours as needed for mild pain., Starting 09/12/2015, Until Discontinued, No Print    albuterol (PROVENTIL HFA;VENTOLIN HFA) 108 (90 Base) MCG/ACT inhaler Inhale 2 puffs into the lungs every 4 (four) hours as needed for wheezing or shortness of breath (cough)., Starting 10/10/2015,  Until Discontinued, Print    citalopram (CELEXA) 20 MG tablet Take 1 tablet (20 mg total) by mouth daily., Starting 08/22/2015, Until Discontinued, Normal    ELIQUIS 5 MG TABS tablet Take 1 tablet by mouth 2 (two) times daily. Reported on 11/08/2015, Starting 10/04/2015, Until Discontinued, Historical Med    feeding supplement, ENSURE ENLIVE, (ENSURE ENLIVE) LIQD Take 237 mLs by mouth 3 (three) times daily between meals.,  Starting 09/12/2015, Until Discontinued, No Print    guaiFENesin-dextromethorphan (ROBITUSSIN DM) 100-10 MG/5ML syrup Take 5 mLs by mouth every 4 (four) hours as needed for cough., Starting 11/21/2015, Until Discontinued, Print    irbesartan (AVAPRO) 300 MG tablet Take 150 mg by mouth daily., Starting 10/05/2015, Until Discontinued, Historical Med    mirtazapine (REMERON) 15 MG tablet Take 1 tablet by mouth daily., Starting 10/04/2015, Until Discontinued, Historical Med    omeprazole (PRILOSEC) 20 MG capsule Take 1 capsule by mouth daily., Starting 10/05/2015, Until Discontinued, Historical Med    pantoprazole (PROTONIX) 20 MG tablet Take 1 tablet (20 mg total) by mouth 2 (two) times daily., Starting 09/12/2015, Until Discontinued, No Print    potassium chloride SA (K-DUR,KLOR-CON) 20 MEQ tablet Take 1 tablet (20 mEq total) by mouth daily., Starting 09/12/2015, Until Discontinued, No Print    QUEtiapine (SEROQUEL) 50 MG tablet Take 1 tablet (50 mg total) by mouth at bedtime., Starting 09/12/2015, Until Discontinued, Print    raloxifene (EVISTA) 60 MG tablet Take 1 tablet by mouth daily., Starting 10/05/2015, Until Discontinued, Historical Med    senna-docusate (SENOKOT-S) 8.6-50 MG tablet Take 2 tablets by mouth 2 (two) times daily., Starting 09/12/2015, Until Discontinued, No Print      STOP taking these medications     HYDROcodone-acetaminophen (NORCO/VICODIN) 5-325 MG tablet      metoprolol tartrate (LOPRESSOR) 25 MG tablet      oseltamivir (TAMIFLU) 30 MG capsule          DISCHARGE INSTRUCTIONS:    DIET:  Regular diet  DISCHARGE CONDITION:  Good  ACTIVITY:  Activity as tolerated  OXYGEN:  Home Oxygen: No.   Oxygen Delivery: room air  DISCHARGE LOCATION:  home   If you experience worsening of your admission symptoms, develop shortness of breath, life threatening emergency, suicidal or homicidal thoughts you must seek medical attention immediately by calling 911 or  calling your MD immediately  if symptoms less severe.  You Must read complete instructions/literature along with all the possible adverse reactions/side effects for all the Medicines you take and that have been prescribed to you. Take any new Medicines after you have completely understood and accpet all the possible adverse reactions/side effects.   Please note  You were cared for by a hospitalist during your hospital stay. If you have any questions about your discharge medications or the care you received while you were in the hospital after you are discharged, you can call the unit and asked to speak with the hospitalist on call if the hospitalist that took care of you is not available. Once you are discharged, your primary care physician will handle any further medical issues. Please note that NO REFILLS for any discharge medications will be authorized once you are discharged, as it is imperative that you return to your primary care physician (or establish a relationship with a primary care physician if you do not have one) for your aftercare needs so that they can reassess your need for medications and monitor your lab values.    On the day of  Discharge:  VITAL SIGNS:  Blood pressure 108/68, pulse 82, temperature 97.8 F (36.6 C), temperature source Oral, resp. rate 16, height  (1.575 m), weight 68.357 kg (150 lb 11.2 oz), SpO2 98 %. PHYSICAL EXAMINATION:  GENERAL:  82 y.o.-year-old patient lying in the bed with no acute distress.  EYES: Pupils equal, round, reactive to light and accommodation. No scleral icterus. Extraocular muscles intact.  HEENT: Head atraumatic, normocephalic. Oropharynx and nasopharynx clear.  NECK:  Supple, no jugular venous distention. No thyroid enlargement, no tenderness.  LUNGS: Normal breath sounds bilaterally, no wheezing, rales,rhonchi or crepitation. No use of accessory muscles of respiration.  CARDIOVASCULAR: S1, S2 normal. No murmurs, rubs, or gallops.   ABDOMEN: Soft, non-tender, non-distended. Bowel sounds present. No organomegaly or mass.  EXTREMITIES: No pedal edema, cyanosis, or clubbing.  NEUROLOGIC: Cranial nerves II through XII are intact. Muscle strength 5/5 in all extremities. Sensation intact. Gait not checked.  PSYCHIATRIC: The patient is alert and oriented x 3.  SKIN: No obvious rash, lesion, or ulcer.  DATA REVIEW:   CBC  Recent Labs Lab 12/13/15 1056  WBC 7.4  HGB 10.6*  HCT 32.4*  PLT 231    Chemistries   Recent Labs Lab 12/13/15 1056  NA 140  K 4.5  CL 110  CO2 23  GLUCOSE 112*  BUN 20  CREATININE 1.56*  CALCIUM 8.7*  AST 39  ALT 13*  ALKPHOS 50  BILITOT 0.4    Cardiac Enzymes  Recent Labs Lab 12/13/15 1056  TROPONINI 0.03    Microbiology Results  Results for orders placed or performed during the hospital encounter of 11/21/15  Rapid Influenza A&B Antigens ([facility] only)     Status: Abnormal   Collection Time: 11/21/15  1:00 AM  Result Value Ref Range Status   Influenza A ([facility]) DETECTED (A) NEGATIVE Final   Influenza B ([facility]) NOT DETECTED (A) NEGATIVE Final  Culture, blood (routine x 2)     Status: None   Collection Time: 11/21/15  1:03 AM  Result Value Ref Range Status   Specimen Description BLOOD  Final   Special Requests NONE  Final   Culture NO GROWTH 5 DAYS  Final   Report Status 11/26/2015 FINAL  Final  Culture, blood (routine x 2)     Status: None   Collection Time: 11/21/15  1:04 AM  Result Value Ref Range Status   Specimen Description BLOOD  Final   Special Requests NONE  Final   Culture NO GROWTH 5 DAYS  Final   Report Status 11/26/2015 FINAL  Final    RADIOLOGY:  Dg Chest 1 View  12/13/2015  CLINICAL DATA:  Left facial droop, dizziness, syncope EXAM: CHEST 1 VIEW COMPARISON:  Chest x-ray of 11/21/2015 FINDINGS: No active infiltrate or effusion is seen. Probable chronic changes in the upper lobes to the apices appears stable with pleural-parenchymal scarring present.  Mediastinal and hilar contours are unchanged. Cardiomegaly is stable. No acute bony abnormality is noted. IMPRESSION: Stable cardiomegaly and chronic change in the lung apices. No active lung disease. Electronically Signed   By: Dwyane Dee M.D.   On: 12/13/2015 11:50   Ct Head Wo Contrast  12/13/2015  CLINICAL DATA:  Left facial droop, syncope Um a dizziness EXAM: CT HEAD WITHOUT CONTRAST TECHNIQUE: Contiguous axial images were obtained from the base of the skull through the vertex without intravenous contrast. COMPARISON:  MR brain of 12/12/2015 and CT brain of 12/11/2015 FINDINGS: The ventricular system remains prominent as are the  cortical sulci and cerebellar folia consistent with diffuse atrophy. The septum is midline in position. Low-attenuation bifrontally is consistent with encephalomalacia apparently due to prior trauma compared to prior dictated reports. No hemorrhage, mass lesion, or acute infarction is seen. On bone window images changes of right maxillary sinusitis are noted with mucosal thickening and air-fluid level. No calvarial abnormality is seen. IMPRESSION: 1. No acute intracranial abnormality. Bifrontal encephalomalacia is stable. 2. Again changes of right maxillary sinusitis are noted. Electronically Signed   By: Dwyane DeePaul  Barry M.D.   On: 12/13/2015 11:55   Mr Brain Wo Contrast  12/12/2015  CLINICAL DATA:  82 year old female with acute headache. Initial encounter. Personal history of previous head trauma. EXAM: MRI HEAD WITHOUT CONTRAST MRA HEAD WITHOUT CONTRAST TECHNIQUE: Multiplanar, multiecho pulse sequences of the brain and surrounding structures were obtained without intravenous contrast. Angiographic images of the head were obtained using MRA technique without contrast. COMPARISON:  Head CT without contrast 12/11/2015 and earlier. FINDINGS: MRI HEAD FINDINGS Chronic inferior bifrontal encephalomalacia. Associated hemosiderin and chronic blood products in the left inferior frontal  gyrus, causing susceptibility artifact on diffusion weighted imaging in that region. No restricted diffusion to suggest acute infarction. No midline shift, mass effect, evidence of mass lesion, ventriculomegaly, extra-axial collection or acute intracranial hemorrhage. Cervicomedullary junction and pituitary are within normal limits. Major intracranial vascular flow voids are preserved. Additional cortical encephalomalacia in the lateral temporal lobes, greater on the right. Mild superficial siderosis along the left operculum. Superimposed patchy mostly periventricular cerebral white matter T2 and FLAIR hyperintensity. Deep gray matter nuclei, brainstem, and cerebellum are within normal limits for age. Visible internal auditory structures appear normal. Trace mastoid fluid. Mild paranasal sinus mucosal thickening, most pronounced in the right maxillary sinus. Postoperative changes to both globes. Otherwise negative orbit and scalp soft tissues. Multilevel cervical spine disc degeneration. Visualized bone marrow signal is within normal limits. MRA HEAD FINDINGS Antegrade flow in the posterior circulation, with MOTSA artifact suspected in the bilateral vertebral artery V4 segments. Both PICA origins appear normal. Basilar artery is normal. SCA and PCA origins are normal. Posterior communicating arteries are diminutive or absent. Bilateral PCA branches are within normal limits. Tortuous distal cervical ICA on the right. Antegrade flow in both ICA siphons. Cavernous and supraclinoid segment irregularity with up to mild proximal supraclinoid stenosis, greater on the left. Ophthalmic artery origins remain normal. Patent carotid termini. Normal MCA and ACA origins. Anterior communicating artery and visualized bilateral ACA branches are within normal limits. Left MCA M1 segments and MCA bifurcations are patent. Visualized bilateral MCA branches are within normal limits, some the MOTSA artifact noted. IMPRESSION: 1.  No acute  intracranial abnormality. 2. Posttraumatic changes to the brain with bifrontal and bitemporal encephalomalacia. Left frontal lobe hemosiderin and mild left operculum superficial siderosis. 3. Intracranial MRA mildly degraded by artifact. Intracranial atherosclerosis with mild ICA siphon stenosis greater on the left. Electronically Signed   By: Odessa FlemingH  Hall M.D.   On: 12/12/2015 13:29   Mr Maxine GlennMra Head/brain Wo Cm  12/12/2015  CLINICAL DATA:  82 year old female with acute headache. Initial encounter. Personal history of previous head trauma. EXAM: MRI HEAD WITHOUT CONTRAST MRA HEAD WITHOUT CONTRAST TECHNIQUE: Multiplanar, multiecho pulse sequences of the brain and surrounding structures were obtained without intravenous contrast. Angiographic images of the head were obtained using MRA technique without contrast. COMPARISON:  Head CT without contrast 12/11/2015 and earlier. FINDINGS: MRI HEAD FINDINGS Chronic inferior bifrontal encephalomalacia. Associated hemosiderin and chronic blood products in the left inferior  frontal gyrus, causing susceptibility artifact on diffusion weighted imaging in that region. No restricted diffusion to suggest acute infarction. No midline shift, mass effect, evidence of mass lesion, ventriculomegaly, extra-axial collection or acute intracranial hemorrhage. Cervicomedullary junction and pituitary are within normal limits. Major intracranial vascular flow voids are preserved. Additional cortical encephalomalacia in the lateral temporal lobes, greater on the right. Mild superficial siderosis along the left operculum. Superimposed patchy mostly periventricular cerebral white matter T2 and FLAIR hyperintensity. Deep gray matter nuclei, brainstem, and cerebellum are within normal limits for age. Visible internal auditory structures appear normal. Trace mastoid fluid. Mild paranasal sinus mucosal thickening, most pronounced in the right maxillary sinus. Postoperative changes to both globes. Otherwise  negative orbit and scalp soft tissues. Multilevel cervical spine disc degeneration. Visualized bone marrow signal is within normal limits. MRA HEAD FINDINGS Antegrade flow in the posterior circulation, with MOTSA artifact suspected in the bilateral vertebral artery V4 segments. Both PICA origins appear normal. Basilar artery is normal. SCA and PCA origins are normal. Posterior communicating arteries are diminutive or absent. Bilateral PCA branches are within normal limits. Tortuous distal cervical ICA on the right. Antegrade flow in both ICA siphons. Cavernous and supraclinoid segment irregularity with up to mild proximal supraclinoid stenosis, greater on the left. Ophthalmic artery origins remain normal. Patent carotid termini. Normal MCA and ACA origins. Anterior communicating artery and visualized bilateral ACA branches are within normal limits. Left MCA M1 segments and MCA bifurcations are patent. Visualized bilateral MCA branches are within normal limits, some the MOTSA artifact noted. IMPRESSION: 1.  No acute intracranial abnormality. 2. Posttraumatic changes to the brain with bifrontal and bitemporal encephalomalacia. Left frontal lobe hemosiderin and mild left operculum superficial siderosis. 3. Intracranial MRA mildly degraded by artifact. Intracranial atherosclerosis with mild ICA siphon stenosis greater on the left. Electronically Signed   By: Odessa Fleming M.D.   On: 12/12/2015 13:29   She remains at very high risk for readmissions. I strongly recommend Palliative care eval - I had long d/w patient's son Link Snuffer @ [phone].  He understands and is agreeable with discharge back to her facility with outpatient follow-up with neurology as already scheduled and her primary care physician.  Follow-up Information    Follow up with BABAOFF, Lavada Mesi, MD. Go on 12/18/2015.   Specialty:  Family Medicine   Why:  @ 2:00pm for [facility] Discharge F/UP   Contact information:   [address] [facility] and Internal Medicine [place] [address] [phone]       Management plans discussed with the patient, family and they are in agreement.  CODE STATUS: FULL CODE  TOTAL TIME TAKING CARE OF THIS PATIENT: 55 minutes.    [facility], Tatem Holsonback M.D on 12/14/2015 at 6:16 AM  Between 7am to 6pm - Pager - [phone]  After 6pm go to [email or web address] - password [email or web address] [facility]  [facility]  Office  [phone]  CC: Primary care physician; BABAOFF, Lavada Mesi, MD   Note: This dictation was prepared with Dragon dictation along with smaller phrase technology. Any transcriptional errors that result from this process are unintentional.

## 2015-12-26 ENCOUNTER — Other Ambulatory Visit: Payer: Self-pay | Admitting: Cardiovascular Disease

## 2015-12-28 ENCOUNTER — Encounter: Payer: Self-pay | Admitting: Cardiovascular Disease

## 2015-12-28 ENCOUNTER — Ambulatory Visit (INDEPENDENT_AMBULATORY_CARE_PROVIDER_SITE_OTHER): Payer: Medicare Other | Admitting: Cardiovascular Disease

## 2015-12-28 VITALS — BP 130/54 | HR 71 | Ht 63.0 in | Wt 149.5 lb

## 2015-12-28 DIAGNOSIS — I4891 Unspecified atrial fibrillation: Secondary | ICD-10-CM | POA: Diagnosis not present

## 2015-12-28 DIAGNOSIS — I472 Ventricular tachycardia, unspecified: Secondary | ICD-10-CM

## 2015-12-28 DIAGNOSIS — I5022 Chronic systolic (congestive) heart failure: Secondary | ICD-10-CM

## 2015-12-28 DIAGNOSIS — I639 Cerebral infarction, unspecified: Secondary | ICD-10-CM

## 2015-12-28 MED ORDER — AMIODARONE HCL 200 MG PO TABS: 200.0000 mg | ORAL_TABLET | Freq: Every day | ORAL | Status: DC

## 2015-12-28 MED ORDER — APIXABAN 2.5 MG PO TABS: 2.5000 mg | ORAL_TABLET | Freq: Two times a day (BID) | ORAL | Status: DC

## 2015-12-28 NOTE — Progress Notes (Signed)
Cardiology Office Note   Date:  12/28/2015   ID:  Lisa [name], DOB [birth year], MRN [id number]  PCP:  Lisa Box, MD  Cardiologist:   Lisa Bears, MD   Chief Complaint  Patient presents with  . other    1 month follow up as well as discuss Echo. Meds reviewed by the patient verbally. "Doing well."       History of Present Illness: Lisa [name] is a 82 y.o. female who presents for a follow-up Visit regarding chronic systolic heart failure.   She presented in November 2016 with subdural and subarachnoid hemorrhage after a fall. She suffered cardiac arrest at that time due to ventricular tachycardia. Troponin was mildly elevated. Echocardiogram showed an ejection fraction of 15-20% with akinesis of the entire anteroseptal myocardium with moderate pulmonary hypertension. She was suspected of having stress-induced cardiomyopathy and was managed conservatively. She was placed on amiodarone for ventricular tachycardia. There was discussion about comfort care. However, surprisingly the patient slowly recovered. She was discharged to rehabilitation. She developed multiple complications including urinary tract infection and right leg DVT. She was treated with anticoagulation after clearance by neurosurgery. A follow-up echocardiogram in February 2017 showed an ejection fraction of 35-40% with severe anteroseptal hypokinesis. I discontinued amiodarone after that. She was hospitalized in early March with influenza A. She had another hospitalization in late March with headache, difficulty in speech and numbness in the left side of the face. MRI was negative for acute pathology. She had an episode of atrial fibrillation while hospitalized which lasted for about 2 hours. Amiodarone was resumed. She has been doing reasonably well and currently she is home and gets home physical therapy. She denies any chest pain. Dyspnea is stable. No recurrent palpitations.   Past Medical History  Diagnosis Date    . Allergy   . Pelvis fracture (HCC)   . Wrist fracture, right   . Depression   . Spinal stenosis   . Osteoarthritis   . Hypertension   . Foot fracture     bilateral  . Brain bleed (HCC)   . V-tach (HCC)   . Chronic systolic CHF (congestive heart failure) (HCC)   . Renal disorder     Past Surgical History  Procedure Laterality Date  . Back surgery N/A   . Breast lumpectomy N/A      Current Outpatient Prescriptions  Medication Sig Dispense Refill  . acetaminophen (TYLENOL) 325 MG tablet Take 1-2 tablets (325-650 mg total) by mouth every 4 (four) hours as needed for mild pain.    [name] amiodarone (PACERONE) 200 MG tablet TAKE ONE TABLET BY MOUTH TWICE DAILY 30 tablet 6  . ELIQUIS 5 MG TABS tablet Take 1 tablet by mouth 2 (two) times daily. Reported on 11/08/2015  0  . mirtazapine (REMERON) 15 MG tablet Take 1 tablet by mouth daily.  0  . omeprazole (PRILOSEC) 20 MG capsule Take 1 capsule by mouth daily.  10  . potassium chloride SA (K-DUR,KLOR-CON) 20 MEQ tablet Take 1 tablet (20 mEq total) by mouth daily.    . QUEtiapine (SEROQUEL) 50 MG tablet Take 1 tablet (50 mg total) by mouth at bedtime. 7 tablet 0  . raloxifene (EVISTA) 60 MG tablet Take 1 tablet by mouth daily.    [name] senna-docusate (SENOKOT-S) 8.6-50 MG tablet Take 2 tablets by mouth 2 (two) times daily.     No current facility-administered medications for this visit.    Allergies:   Ambien; Daypro; Oxycodone; Shellfish  allergy; Ace inhibitors; and Norvasc    Social History:  The patient  reports that she has never smoked. She has never used smokeless tobacco. She reports that she does not drink alcohol or use illicit drugs.   Family History:  [name] family history includes COPD in her father and mother; Heart disease in her father.    ROS:  Please see the history of present illness.   Otherwise, review of systems are positive for none.   All other systems are reviewed and negative.    PHYSICAL EXAM: VS:  BP  130/54 mmHg  Pulse 71  Ht 5\' 3"  (1.6 m)  Wt 149 lb 8 oz (67.813 kg)  BMI 26.49 kg/m2 , BMI Body mass index is 26.49 kg/(m^2). GEN: Well nourished, well developed, in no acute distress HEENT: normal Neck: no JVD, carotid bruits, or masses Cardiac: RRR; no murmurs, rubs, or gallops,no edema  Respiratory:  clear to auscultation bilaterally, normal work of breathing GI: soft, nontender, nondistended, + BS MS: no deformity or atrophy Skin: warm and dry, no rash Neuro:  Strength and sensation are intact Psych: euthymic mood, full affect   EKG:  EKG is ordered today. The ekg ordered today demonstrates normal sinus rhythm with left bundle branch block.   Recent Labs: 08/21/2015: Magnesium 1.9 11/21/2015: TSH 8.995* 12/13/2015: ALT 13*; B Natriuretic Peptide 48.0; BUN 20; Creatinine, Ser 1.56*; Hemoglobin 10.6*; Platelets 231; Potassium 4.5; Sodium 140    Lipid Panel    Component Value Date/Time   CHOL 174 12/10/2015 0537   TRIG 115 12/10/2015 0537   HDL 44 12/10/2015 0537   CHOLHDL 4.0 12/10/2015 0537   VLDL 23 12/10/2015 0537   LDLCALC 107* 12/10/2015 0537      Wt Readings from Last 3 Encounters:  12/28/15 149 lb 8 oz (67.813 kg)  12/13/15 147 lb 3 oz (66.764 kg)  12/10/15 150 lb 11.2 oz (68.357 kg)        ASSESSMENT AND PLAN:  1.  Chronic systolic heart failure: This was initially thought to be due to stress-induced cardiomyopathy. However, follow-up echocardiogram did not show normalization of LV systolic function. Thus, ischemic cardiomyopathy cannot be completely excluded. Given her age and comorbidities, I recommend continuing medical therapy with no further ischemic cardiac evaluation at the present time especially that she had does not have anginal symptoms. Continue treatment with an ARB. We might need to consider resuming small dose carvedilol which was discontinued due to bradycardia. She currently appears to be euvolemic.  2. Paroxysmal atrial fibrillation: She is  currently in normal sinus rhythm. I decreased the dose of amiodarone to 200 mg once daily. Given her age and chronic kidney disease, I decreased the dose of Eliquis to 2.5 mg twice daily. She previously was on the full dose of 5 mg twice daily for treatment of DVT but this has been more than 3 months.     Disposition:   FU with me in 3 months  Signed,  Lisa BearsMuhammad Haydn Hutsell, MD  12/28/2015 3:00 PM    [facility]

## 2015-12-28 NOTE — Patient Instructions (Signed)
Medication Instructions:  Your physician has recommended you make the following change in your medication:  DECREASE amiodarone to 200mg  once daily DECREASE eliquis 2.5mg  twice daily   Labwork: none  Testing/Procedures: None  Follow-Up: Your physician recommends that you schedule a follow-up appointment in: 3 months with Dr. Kirke CorinArida.    Any Other Special Instructions Will Be Listed Below (If Applicable).     If you need a refill on your cardiac medications before your next appointment, please call your pharmacy.

## 2016-01-21 ENCOUNTER — Encounter: Payer: Medicare Other | Attending: Physical Medicine & Rehabilitation | Admitting: Physical Medicine & Rehabilitation

## 2016-01-21 ENCOUNTER — Encounter: Payer: Self-pay | Admitting: Physical Medicine & Rehabilitation

## 2016-01-21 VITALS — BP 170/70 | HR 63

## 2016-01-21 DIAGNOSIS — R531 Weakness: Secondary | ICD-10-CM | POA: Diagnosis not present

## 2016-01-21 DIAGNOSIS — M961 Postlaminectomy syndrome, not elsewhere classified: Secondary | ICD-10-CM

## 2016-01-21 DIAGNOSIS — X58XXXD Exposure to other specified factors, subsequent encounter: Secondary | ICD-10-CM | POA: Diagnosis not present

## 2016-01-21 DIAGNOSIS — S065X0D Traumatic subdural hemorrhage without loss of consciousness, subsequent encounter: Secondary | ICD-10-CM | POA: Insufficient documentation

## 2016-01-21 DIAGNOSIS — F419 Anxiety disorder, unspecified: Secondary | ICD-10-CM | POA: Diagnosis not present

## 2016-01-21 DIAGNOSIS — M1990 Unspecified osteoarthritis, unspecified site: Secondary | ICD-10-CM | POA: Insufficient documentation

## 2016-01-21 DIAGNOSIS — I6200 Nontraumatic subdural hemorrhage, unspecified: Secondary | ICD-10-CM | POA: Diagnosis not present

## 2016-01-21 DIAGNOSIS — I5022 Chronic systolic (congestive) heart failure: Secondary | ICD-10-CM | POA: Insufficient documentation

## 2016-01-21 DIAGNOSIS — M545 Low back pain: Secondary | ICD-10-CM | POA: Diagnosis not present

## 2016-01-21 DIAGNOSIS — M549 Dorsalgia, unspecified: Secondary | ICD-10-CM | POA: Insufficient documentation

## 2016-01-21 DIAGNOSIS — I639 Cerebral infarction, unspecified: Secondary | ICD-10-CM

## 2016-01-21 DIAGNOSIS — F5105 Insomnia due to other mental disorder: Secondary | ICD-10-CM | POA: Diagnosis not present

## 2016-01-21 DIAGNOSIS — S066X0D Traumatic subarachnoid hemorrhage without loss of consciousness, subsequent encounter: Secondary | ICD-10-CM | POA: Insufficient documentation

## 2016-01-21 DIAGNOSIS — S065XAA Traumatic subdural hemorrhage with loss of consciousness status unknown, initial encounter: Secondary | ICD-10-CM

## 2016-01-21 DIAGNOSIS — Z9889 Other specified postprocedural states: Secondary | ICD-10-CM | POA: Insufficient documentation

## 2016-01-21 DIAGNOSIS — F329 Major depressive disorder, single episode, unspecified: Secondary | ICD-10-CM | POA: Diagnosis not present

## 2016-01-21 DIAGNOSIS — M47816 Spondylosis without myelopathy or radiculopathy, lumbar region: Secondary | ICD-10-CM

## 2016-01-21 DIAGNOSIS — R000 Tachycardia, unspecified: Secondary | ICD-10-CM | POA: Insufficient documentation

## 2016-01-21 DIAGNOSIS — M7989 Other specified soft tissue disorders: Secondary | ICD-10-CM | POA: Insufficient documentation

## 2016-01-21 DIAGNOSIS — I110 Hypertensive heart disease with heart failure: Secondary | ICD-10-CM | POA: Diagnosis not present

## 2016-01-21 DIAGNOSIS — R51 Headache: Secondary | ICD-10-CM | POA: Diagnosis not present

## 2016-01-21 DIAGNOSIS — G8929 Other chronic pain: Secondary | ICD-10-CM | POA: Insufficient documentation

## 2016-01-21 DIAGNOSIS — F409 Phobic anxiety disorder, unspecified: Secondary | ICD-10-CM

## 2016-01-21 DIAGNOSIS — Z8673 Personal history of transient ischemic attack (TIA), and cerebral infarction without residual deficits: Secondary | ICD-10-CM | POA: Diagnosis not present

## 2016-01-21 DIAGNOSIS — S065X9A Traumatic subdural hemorrhage with loss of consciousness of unspecified duration, initial encounter: Secondary | ICD-10-CM

## 2016-01-21 DIAGNOSIS — R4189 Other symptoms and signs involving cognitive functions and awareness: Secondary | ICD-10-CM | POA: Diagnosis not present

## 2016-01-21 DIAGNOSIS — R410 Disorientation, unspecified: Secondary | ICD-10-CM | POA: Insufficient documentation

## 2016-01-21 NOTE — Patient Instructions (Addendum)
PLEASE CALL ME WITH ANY PROBLEMS OR QUESTIONS (#[phone]).     CHECK WITH YOUR CARDIOLOGIST REGARDING YOUR SWELLING. CHECK YOUR WEIGHT DAILY. TRY WEARING KNEE HIGH COMPRESSION STOCKING.     I WILL BE HAPPY TO WORK WITH DR. BABAOFF REGARDING YOUR REHAB/RECOVERY.

## 2016-01-21 NOTE — Progress Notes (Signed)
Subjective:    Patient ID: Lisa [name], female    DOB: 12/14/1926, 82 y.o.   MRN: [id number]  HPI   Lisa [name] is here in follow up of her TBI and associated deficits. She is frustrated that she can't drive or go up stairs. Care giver is present with her today again at this visit.   She has difficulty falling asleep. She is no longer on the seroquel due to confusion and other neurological changes which precipitated hospital visits. .    She has had a few falls at home and out in the community. Fortunately she was spared any major injuries. She was hospitalized for syncope and ?TIA. She is under further work up by primary and neurology. She recently had an EEG.   Lisa [name] has an occasional headache. They seem to be relieved by one or two tylenol. She is afraid to take more than 2-3 per day.       Pain Inventory Average Pain 2 Pain Right Now 2 My pain is aching  In the last 24 hours, has pain interfered with the following? General activity 0 Relation with others 0 Enjoyment of life 2 What TIME of day is your pain at its worst? evening Sleep (in general) Fair  Pain is worse with: standing Pain improves with: medication Relief from Meds: .  Mobility use a walker how many minutes can you walk? 10 ability to climb steps?  no do you drive?  no Do you have any goals in this area?  yes  Function retired  Neuro/Psych weakness trouble walking confusion anxiety  Prior Studies Any changes since last visit?  no  Physicians involved in your care Any changes since last visit?  no   Family History  Problem Relation Age of Onset  . COPD Mother   . COPD Father   . Heart disease Father    Social History   Social History  . Marital Status: Widowed    Spouse Name: N/A  . Number of Children: N/A  . Years of Education: N/A   Occupational History  . retired    Social History Main Topics  . Smoking status: Never Smoker   . Smokeless tobacco: Never Used  .  Alcohol Use: No  . Drug Use: No  . Sexual Activity: Not on file   Other Topics Concern  . Not on file   Social History Narrative   Past Surgical History  Procedure Laterality Date  . Back surgery N/A   . Breast lumpectomy N/A    Past Medical History  Diagnosis Date  . Allergy   . Pelvis fracture (HCC)   . Wrist fracture, right   . Depression   . Spinal stenosis   . Osteoarthritis   . Hypertension   . Foot fracture     bilateral  . Brain bleed (HCC)   . V-tach (HCC)   . Chronic systolic CHF (congestive heart failure) (HCC)   . Renal disorder    There were no vitals taken for this visit.  Opioid Risk Score:   Fall Risk Score:  `1  Depression screen PHQ 2/9  Depression screen [facility] 2/9 10/22/2015 07/24/2015 07/16/2015 06/26/2015 05/22/2015 04/23/2015 04/02/2015  Decreased Interest 0 0 0 0 0 0 0  Down, Depressed, Hopeless 0 0 0 0 0 0 0  PHQ - 2 Score 0 0 0 0 0 0 0  Altered sleeping 0 - - - - - -  Tired, decreased energy 0 - - - - - -  Change in appetite 0 - - - - - -  Feeling bad or failure about yourself  0 - - - - - -  Trouble concentrating 0 - - - - - -  Moving slowly or fidgety/restless 0 - - - - - -  Suicidal thoughts 0 - - - - - -  PHQ-9 Score 0 - - - - - -     Review of Systems     Objective:   Physical Exam  HENT:  Head: Normocephalic and atraumatic.  Right Ear: No decreased hearing is noted.  Left Ear: No decreased hearing is noted.  Mouth/Throat: Mucous membranes still dry. Debris on teeth as well..  Eyes: Conjunctivae are normal. Pupils are equal, round, and reactive to light.  Neck: Normal range of motion. Neck supple.  Cardiovascular: Normal rate and regular rhythm. trace edema either leg  Respiratory: Effort normal and breath sounds normal. No respiratory distress. She has no wheezes.  GI: Soft. Bowel sounds are normal. She exhibits no distension. There is no tenderness. There is no rebound.  Musculoskeletal: low back tender with truncal movement  and palpation  Neurological: She is much more alert, impaired memory but improving. Normal speech. Better insight  Skin: Skin is warm and dry.  Abrasions resolved on legs  Motor 4/5 UE's. LE's 4/5 at hips/knees partly due to pain. 4/5 ankles  Sensation intact to light touch in bilateral upper and lower limbs  Skin:intact without breakdown   Assessment & Plan:   Medical Problem List and Plan:  1. 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 Recent TIA.  -Still needs supervision at home.  -caregivers at home for supervision  3. 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 Last [facility] 07/16/15.  -mgt per pain mgt doctor---needs follow up 4. Mood: LCSW to follow for evaluation and support.  5. Neuropsych:  -base line cognitive deficits present -would benefit from resumption of seroquel to assist sleep -I would be happy to work with primary/neurology in this regard but don't want to cause confusion in care plan 6. Fluids/Electrolytes/Nutrition: off intake  7. Shocked heart syndrome with wide complex tachycardia/chronic CHF:  -needs to follow up with [facility]  -check weight daily at home -Knee high TEDS recommended to help control edema for the short term 8. Depression: of all meds    Follow up with me in 3 months. Thirty minutes of face to face patient care time were spent during this visit. All questions were encouraged and answered.

## 2016-02-20 ENCOUNTER — Encounter: Payer: Self-pay | Admitting: Physical Therapy

## 2016-02-20 ENCOUNTER — Ambulatory Visit: Payer: Medicare Other | Attending: Physical Medicine & Rehabilitation | Admitting: Physical Therapy

## 2016-02-20 DIAGNOSIS — R262 Difficulty in walking, not elsewhere classified: Secondary | ICD-10-CM | POA: Diagnosis present

## 2016-02-20 DIAGNOSIS — R2681 Unsteadiness on feet: Secondary | ICD-10-CM | POA: Diagnosis not present

## 2016-02-20 NOTE — Therapy (Signed)
[place] [facility] MAIN [facility] SERVICES [address] [place], [place], [address] Phone: [phone]   Fax:  [phone]  Physical Therapy Evaluation  Patient Details  Name: Lisa [name] MRN: [id number] Date of Birth: 31-Mar-1927 Referring Provider: Faith Rogue T  Encounter Date: 02/20/2016      PT End of Session - 02/20/16 1148    Visit Number 1   Number of Visits 17   Date for PT Re-Evaluation 2016/04/26   Authorization Type g codes   PT Start Time 1130   PT Stop Time 1215   PT Time Calculation (min) 45 min   Equipment Utilized During Treatment Gait belt   Activity Tolerance Patient tolerated treatment well;Patient limited by fatigue   Behavior During Therapy [facility] for tasks assessed/performed      Past Medical History  Diagnosis Date  . Allergy   . Pelvis fracture (HCC)   . Wrist fracture, right   . Depression   . Spinal stenosis   . Osteoarthritis   . Hypertension   . Foot fracture     bilateral  . Brain bleed (HCC)   . V-tach (HCC)   . Chronic systolic CHF (congestive heart failure) (HCC)   . Renal disorder     Past Surgical History  Procedure Laterality Date  . Back surgery N/A   . Breast lumpectomy N/A     There were no vitals filed for this visit.       Subjective Assessment - 02/20/16 1137    Subjective Patient has unsteady gait.   Pertinent History anxiety, high blood pressure, heart disease, Osteoporosis   Limitations Walking;Standing   How long can you stand comfortably? Patient can stand for under 30 minutes   Patient Stated Goals to walk safely   Currently in Pain? Yes   Pain Score 4    Pain Location Back   Pain Descriptors / Indicators Aching   Pain Type Chronic pain   Pain Frequency Constant            [facility] PT Assessment - 02/20/16 0001    Assessment   Medical Diagnosis TBI   Referring Provider SWARTZ, Earna Coder T   Onset Date/Surgical Date 01/21/16   Hand Dominance Left   Prior Therapy yes   Precautions   Precautions Fall   Restrictions   Weight Bearing Restrictions No   Balance Screen   Has the patient fallen in the past 6 months Yes   How many times? 2   Has the patient had a decrease in activity level because of a fear of falling?  Yes   Is the patient reluctant to leave their home because of a fear of falling?  No   Home Environment   Living Environment Private residence   Living Arrangements Other (Comment)  sitter   Available Help at Discharge Personal care attendant   Type of Home --  condo   Home Access Stairs to enter   Entrance Stairs-Number of Steps 3   Entrance Stairs-Rails Right   Home Layout Two level   Alternate Level Stairs-Number of Steps 12   Alternate Level Stairs-Rails Right;Left   Home Equipment Walker - 2 wheels;Shower seat;Grab bars - tub/shower;Cane - single point   Additional Comments --  raised toilet seat and transport chair   Prior Function   Level of Independence Independent with basic ADLs;Independent with household mobility with device   Vocation Retired   Leisure TV, read, walking   Cognition   Overall Cognitive Status  Within Functional Limits for tasks assessed   Attention Focused   Focused Attention Appears intact   Awareness Appears intact   Problem Solving Appears intact        PAIN: back pain  POSTURE: WNL   PROM/AROM:  WFL  STRENGTH:  Graded on a 0-5 scale Muscle Group Left Right  Shoulder flex 5 5  Shoulder Abd 4 4  Shoulder Ext 4 4  Shoulder IR/ER 4 4  Elbow 4 4  Wrist/hand 3 3  Hip Flex 4 5  Hip Abd 4 5  Hip Add 3 3  Hip Ext 3 3  Hip IR/ER 4 4  Knee Flex 5 4  Knee Ext 5 5  Ankle DF 4 1  Ankle PF 3 3   SENSATION:  intact   FUNCTIONAL MOBILITY:  Slow and independent   BALANCE: unable to tandem stand or single leg stand  GAIT: Patient ambulates with RW independently for short distances and RLE toe catching on carpet.  OUTCOME MEASURES: TEST Outcome Interpretation  5 times sit<>stand 18.99  sec >60 yo, >15 sec indicates increased risk for falls  10 meter walk test   .60              m/s <1.0 m/s indicates increased risk for falls; limited community ambulator  Timed up and Go     32             sec <14 sec indicates increased risk for falls  6 minute walk test   350             Feet 1000 feet is community ambulator                                PT Education - 02/20/16 1318    Education provided Yes   Education Details HEP   Person(s) Educated Patient   Methods Explanation   Comprehension Verbalized understanding             PT Long Term Goals - 02/20/16 1322    PT LONG TERM GOAL #1   Title Patient will be independent in home exercise program to improve strength/mobility for better functional independence with ADLs.   Time 8   Period Weeks   Status New   PT LONG TERM GOAL #2   Title Patient (> 80 years old) will complete five times sit to stand test in < 15 seconds indicating an increased LE strength and improved balance.   Time 8   Period Weeks   Status New   PT LONG TERM GOAL #3   Title Patient will increase six minute walk test distance to >1000 for progression to community ambulator and improve gait ability   Time 8   Period Weeks   Status New   PT LONG TERM GOAL #4   Title Patient will reduce timed up and go to <11 seconds to reduce fall risk and demonstrate improved transfer/gait ability.   Time 8   Period Weeks   Status New               Plan - 02/20/16 1318    Clinical Impression Statement Patient has unsteady gait and difficulty walking and presents with decreased balance, decreased strength in right ankle and right hip . She ambulates with RW and has difficulty with turns. She has a falls risk indicated by outcome measures.    Rehab Potential Fair  PT Frequency 2x / week   PT Duration 8 weeks   PT Treatment/Interventions Therapeutic exercise;Balance training;Therapeutic activities;Gait training   PT Next Visit Plan  strengthening and gait training and balance training   PT Home Exercise Plan hip abd with YTB hip flex with YTB   Consulted and Agree with Plan of Care Patient;Other (Comment)  care giver      Patient will benefit from skilled therapeutic intervention in order to improve the following deficits and impairments:  Impaired flexibility, Difficulty walking, Decreased safety awareness, Decreased balance, Decreased activity tolerance, Decreased mobility, Pain  Visit Diagnosis: Unsteadiness on feet  Difficulty in walking, not elsewhere classified      G-Codes - 02/29/2016 1324    Functional Assessment Tool Used 5 x sit to stand, TUG, 6 MW, 10 MW   Functional Limitation Mobility: Walking and moving around   Mobility: Walking and Moving Around Current Status [phone]) At least 40 percent but less than 60 percent impaired, limited or restricted   Mobility: Walking and Moving Around Goal Status [phone]) At least 20 percent but less than 40 percent impaired, limited or restricted       Problem List Patient Active Problem List   Diagnosis Date Noted  . New onset atrial fibrillation (HCC) 12/11/2015  . Altered mental state   . Congestive dilated cardiomyopathy (HCC)   . Dementia   . Headache 12/10/2015  . TIA (transient ischemic attack) 12/10/2015  . Influenza A 11/21/2015  . Ventricular tachycardia (HCC) 11/09/2015  . Constipation 09/12/2015  . Insomnia due to anxiety and fear 09/12/2015  . Depression, controlled 09/12/2015  . Bacterial UTI 08/27/2015  . DVT, lower extremity (HCC) 08/27/2015  . TBI (traumatic brain injury) (HCC) 08/22/2015  . ICH (intracerebral hemorrhage) (HCC)   . Abscess   . Hypomagnesemia   . SDH (subdural hematoma) (HCC)   . Boil of upper extremity   . Pneumonia due to Haemophilus influenzae (HCC)   . Chronic systolic CHF (congestive heart failure) (HCC)   . Acute on chronic renal failure (HCC)   . Hypokalemia   . Bright red blood per rectum   . Dysphagia   .  Pressure ulcer 08/16/2015  . Acute respiratory failure with hypoxemia (HCC)   . Hypernatremia   . Subdural hematoma (HCC) 08/03/2015  . Lumbar radiculopathy 06/06/2015  . Spinal stenosis, lumbar region, with neurogenic claudication 04/02/2015  . Degenerative lumbar disc 02/15/2015  . Lumbar post-laminectomy syndrome 02/15/2015  . Facet syndrome, lumbar 02/15/2015  . Sacroiliac joint dysfunction 02/15/2015   Ezekiel Ina, PT, DPT [place], Barkley Bruns S 29-Feb-2016, 1:35 PM  [place] [facility] MAIN [facility] SERVICES [address] [place], [place], [address] Phone: [phone]   Fax:  [phone]  Name: ADALINA DOPSON MRN: [id number] Date of Birth: Mar 26, 1927

## 2016-02-25 ENCOUNTER — Encounter: Payer: Self-pay | Admitting: Physical Therapy

## 2016-02-25 ENCOUNTER — Ambulatory Visit: Payer: Medicare Other | Admitting: Physical Therapy

## 2016-02-25 ENCOUNTER — Ambulatory Visit: Payer: Medicare Other | Attending: Physical Medicine & Rehabilitation | Admitting: Speech Pathology

## 2016-02-25 ENCOUNTER — Telehealth: Payer: Self-pay | Admitting: *Deleted

## 2016-02-25 DIAGNOSIS — R2681 Unsteadiness on feet: Secondary | ICD-10-CM | POA: Diagnosis present

## 2016-02-25 DIAGNOSIS — R262 Difficulty in walking, not elsewhere classified: Secondary | ICD-10-CM | POA: Diagnosis present

## 2016-02-25 DIAGNOSIS — R41841 Cognitive communication deficit: Secondary | ICD-10-CM | POA: Insufficient documentation

## 2016-02-25 NOTE — Telephone Encounter (Signed)
Pt's son called. He says the pt was advised to take Xanax for sleep (last clinic note does not indicate). Son says other doctors have advised against taking Xanax. Son believes Rx for Xanax is old and that it has not been recently prescribed....please advise

## 2016-02-25 NOTE — Telephone Encounter (Signed)
I did not advise her to take xanax for sleep nor do I really ever recommend that for sleep. I did mention seroquel at low dose PRN as an option if sleep remained an issue. Xanax is not listed in her MAR either

## 2016-02-25 NOTE — Telephone Encounter (Signed)
Left message to call back  
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
## 2016-02-26 ENCOUNTER — Encounter: Payer: Self-pay | Admitting: Speech Pathology

## 2016-02-26 NOTE — Therapy (Signed)
[place] [facility] MAIN [facility] SERVICES [address] [place], [place], [address] Phone: [phone]   Fax:  [phone]  Speech Language Pathology Evaluation  Patient Details  Name: Lisa [name] MRN: [id number] Date of Birth: July 22, 1927 Referring Provider: Dr. Jocelyn Lamer  Encounter Date: 02/25/2016      End of Session - 02/26/16 1432    Visit Number 1   Number of Visits 13   Date for SLP Re-Evaluation 04/21/16   SLP Start Time 1000   SLP Stop Time  1100   SLP Time Calculation (min) 60 min   Activity Tolerance Patient tolerated treatment well      Past Medical History  Diagnosis Date  . Allergy   . Pelvis fracture (HCC)   . Wrist fracture, right   . Depression   . Spinal stenosis   . Osteoarthritis   . Hypertension   . Foot fracture     bilateral  . Brain bleed (HCC)   . V-tach (HCC)   . Chronic systolic CHF (congestive heart failure) (HCC)   . Renal disorder     Past Surgical History  Procedure Laterality Date  . Back surgery N/A   . Breast lumpectomy N/A     There were no vitals filed for this visit.      Subjective Assessment - 02/26/16 1430    Subjective "Sometimes my words don't go right" "Sometimes I can't state what I want to say"   Currently in Pain? No/denies            SLP Evaluation [facility] - 02/26/16 0001    SLP Visit Information   SLP Received On 02/25/16   Referring Provider Dr. Jocelyn Lamer   Onset Date 2016   Medical Diagnosis TBI   Pain Assessment   Currently in Pain? No/denies   Prior Functional Status   Cognitive/Linguistic Baseline Within functional limits   Oral Motor/Sensory Function   Overall Oral Motor/Sensory Function Appears within functional limits for tasks assessed   Motor Speech   Overall Motor Speech Appears within functional limits for tasks assessed   Standardized Assessments   Standardized Assessments  Cognitive Linguistic Quick Test;Western Aphasia Battery revised      The Cognitive  Linguistic Quick Test (CLQT) was administered to assess the relative status of five cognitive domains: attention, memory, language, executive functioning, and visuospatial skills. Scores from 10 tasks were used to estimate severity ratings (for age groups 18-69 years and 70-89 years) for each domain, a clock drawing task, as well as an overall composite severity rating of cognition.   Task    Score  Criterion Cut Scores Personal Facts  8/8   8  Symbol Cancellation    12/12   10 Confrontation Naming    10/10   10 Clock Drawing *  8/13   11 Story Retelling       8/10   5 Symbol Trails      10/10  6 Generative Naming *      3/9   4 Design Memory  4/6   4 Mazes    4/8   4 Design Generation    6/13   5 * Below criterion cut score  Cognitive Domain  Severity Rating Attention   WNL  Memory   WNL Executive Function  WNL Language   WNL Visuospatial Skills  WNL Clock Drawing  Moderate Composite Severity Rating WNL    Western Aphasia Battery- Screening  Spontaneous Speech      Information  content   10/10       Fluency    10/10     Comprehension     Yes/No questions   10/10          Sequential Commands  10/10    Repetition    10/10     Naming    Object Naming   10/10       Screening Aphasia Quotient 100/100  Reading    10/10 Writing    10/10 Screening Language Quotient 100/100       SLP Education - Mar 26, 2016 1431    Education provided Yes   Education Details Ashby Dawes of speech therapy   Person(s) Educated Patient   Methods Explanation   Comprehension Verbalized understanding            SLP Long Term Goals - 03-26-2016 1435    SLP LONG TERM GOAL #1   Title Patient will complete high level word finding tasks with 80% accuracy.   Time 6   Period Weeks   Status New   SLP LONG TERM GOAL #2   Title Patient will generate grammatical, fluent, and cogent sentence to complete abstract/complex linguistic task with 80% accuracy.   Time 6   Period Weeks   Status New   SLP LONG  TERM GOAL #3   Title Patient will demonstrate functional cognitive-communication skills for independent completion of personal responsibilities.   Time 6   Period Weeks   Status New          Plan - March 26, 2016 1434    Clinical Impression Statement 82 year old woman S/P TBI in 2016 presenting with mild cognitive-linguistic deficits characterized by word finding problems.  With the exception of clock drawing, the patient is testing with attention, memory, executive function, language, and visuospatial skills within normal limits for her age.  Per patient report (and caregiver confirmation) the difficulty with word finding is "up and down" but is very handicapping when it occurs.  The patient will benefit from skilled speech therapy for restorative and compensatory treatment of self-expression as well as ongoing assessment of needs in the realm of cognition.     Speech Therapy Frequency 2x / week   Duration Other (comment)  6 weeks   Potential to Achieve Goals Good   Potential Considerations Ability to learn/carryover information;Co-morbidities;Cooperation/participation level;Medical prognosis;Previous level of function;Severity of impairments;Family/community support   SLP Home Exercise Plan To be determined   Consulted and Agree with Plan of Care Patient      Patient will benefit from skilled therapeutic intervention in order to improve the following deficits and impairments:   Cognitive communication deficit - Plan: SLP plan of care cert/re-cert      G-Codes - 03-26-2016 1437    Functional Assessment Tool Used Cognitive Linguistic Quick Test, Western Aphasia Battery Screening, clinical judgment   Functional Limitations Spoken language expressive   Spoken Language Expression Current Status [phone]) At least 20 percent but less than 40 percent impaired, limited or restricted   Spoken Language Expression Goal Status (U0454) At least 1 percent but less than 20 percent impaired, limited or  restricted      Problem List Patient Active Problem List   Diagnosis Date Noted  . New onset atrial fibrillation (HCC) 12/11/2015  . Altered mental state   . Congestive dilated cardiomyopathy (HCC)   . Dementia   . Headache 12/10/2015  . TIA (transient ischemic attack) 12/10/2015  . Influenza A 11/21/2015  . Ventricular tachycardia (HCC) 11/09/2015  .  Constipation 09/12/2015  . Insomnia due to anxiety and fear 09/12/2015  . Depression, controlled 09/12/2015  . Bacterial UTI 08/27/2015  . DVT, lower extremity (HCC) 08/27/2015  . TBI (traumatic brain injury) (HCC) 08/22/2015  . ICH (intracerebral hemorrhage) (HCC)   . Abscess   . Hypomagnesemia   . SDH (subdural hematoma) (HCC)   . Boil of upper extremity   . Pneumonia due to Haemophilus influenzae (HCC)   . Chronic systolic CHF (congestive heart failure) (HCC)   . Acute on chronic renal failure (HCC)   . Hypokalemia   . Bright red blood per rectum   . Dysphagia   . Pressure ulcer 08/16/2015  . Acute respiratory failure with hypoxemia (HCC)   . Hypernatremia   . Subdural hematoma (HCC) 08/03/2015  . Lumbar radiculopathy 06/06/2015  . Spinal stenosis, lumbar region, with neurogenic claudication 04/02/2015  . Degenerative lumbar disc 02/15/2015  . Lumbar post-laminectomy syndrome 02/15/2015  . Facet syndrome, lumbar 02/15/2015  . Sacroiliac joint dysfunction 02/15/2015   Dollene PrimroseSusan G Leanza Shepperson, MS/CCC- SLP  Leandrew KoyanagiAbernathy, Susie 02/26/2016, 2:41 PM  [facility] [address] [place], [place], [address] Phone: [phone]   Fax:  [phone]  Name: Lisa [name] MRN: [id number] Date of Birth: 08/08/1927

## 2016-02-27 ENCOUNTER — Encounter: Payer: Self-pay | Admitting: Physical Therapy

## 2016-02-27 ENCOUNTER — Encounter: Payer: Self-pay | Admitting: Speech Pathology

## 2016-02-27 ENCOUNTER — Ambulatory Visit: Payer: Medicare Other | Admitting: Physical Therapy

## 2016-02-27 ENCOUNTER — Ambulatory Visit: Payer: Medicare Other | Admitting: Speech Pathology

## 2016-02-27 DIAGNOSIS — R2681 Unsteadiness on feet: Secondary | ICD-10-CM

## 2016-02-27 DIAGNOSIS — R262 Difficulty in walking, not elsewhere classified: Secondary | ICD-10-CM

## 2016-02-27 DIAGNOSIS — R41841 Cognitive communication deficit: Secondary | ICD-10-CM

## 2016-02-27 MED ORDER — QUETIAPINE FUMARATE 50 MG PO TABS: 25.0000 mg | ORAL_TABLET | Freq: Every day | ORAL | Status: DC

## 2016-02-27 NOTE — Therapy (Signed)
[place] [facility] MAIN [facility] SERVICES [address] [place], [place], [address] Phone: [phone]   Fax:  [phone]  Physical Therapy Treatment  Patient Details  Name: Lisa [name] MRN: [id number] Date of Birth: [birth year] Referring Provider: Faith Rogue T  Encounter Date: 02/27/2016      PT End of Session - 02/27/16 1106    Visit Number 3   Number of Visits 17   Date for PT Re-Evaluation 2016-05-16   Authorization Type g codes   PT Start Time 1100   PT Stop Time 1145   PT Time Calculation (min) 45 min   Equipment Utilized During Treatment Gait belt   Activity Tolerance Patient tolerated treatment well;Patient limited by fatigue   Behavior During Therapy [facility] for tasks assessed/performed      Past Medical History  Diagnosis Date  . Allergy   . Pelvis fracture (HCC)   . Wrist fracture, right   . Depression   . Spinal stenosis   . Osteoarthritis   . Hypertension   . Foot fracture     bilateral  . Brain bleed (HCC)   . V-tach (HCC)   . Chronic systolic CHF (congestive heart failure) (HCC)   . Renal disorder     Past Surgical History  Procedure Laterality Date  . Back surgery N/A   . Breast lumpectomy N/A     There were no vitals filed for this visit.      Subjective Assessment - 02/27/16 1104    Subjective Patient has unsteady gait.She is having unsteady gait and weakness.    Pertinent History anxiety, high blood pressure, heart disease, Osteoporosis   Limitations Walking;Standing   How long can you stand comfortably? Patient can stand for under 30 minutes   Patient Stated Goals to walk safely   Currently in Pain? No/denies   Pain Location --  toes      standing hip abd/ hip ext/ hip flex with RTB x 20  side stepping left and right in parallel bars 10 feet x 3 Eccentric step downs from 4 inch stool x 10 x 2 step ups from floor to 6 inch stool x 20 bilateral sit to stand x 10 marching in parallel bars x 20 Nu-step x  10 mins L1 Mini squats with 5 sec hold x 20 Heel raises x 20   Patient needs cues to keep her knee straight with hip extension. Patient has some back pain with prolonged standing and needs a seated rest period following therapy.                              PT Education - 02/27/16 1105    Education provided Yes   Education Details HEP   Person(s) Educated Patient   Methods Explanation   Comprehension Verbalized understanding             PT Long Term Goals - 02/20/16 1322    PT LONG TERM GOAL #1   Title Patient will be independent in home exercise program to improve strength/mobility for better functional independence with ADLs.   Time 8   Period Weeks   Status New   PT LONG TERM GOAL #2   Title Patient (> 79 years old) will complete five times sit to stand test in < 15 seconds indicating an increased LE strength and improved balance.   Time 8   Period Weeks   Status New   PT  LONG TERM GOAL #3   Title Patient will increase six minute walk test distance to >1000 for progression to community ambulator and improve gait ability   Time 8   Period Weeks   Status New   PT LONG TERM GOAL #4   Title Patient will reduce timed up and go to <11 seconds to reduce fall risk and demonstrate improved transfer/gait ability.   Time 8   Period Weeks   Status New               Plan - 02/27/16 1106    Clinical Impression Statement PT provided min verbal instruction to improve set up, proper use of LE, and improved posture and gait mechanics. Patient responded moderately to instruction   Rehab Potential Fair   PT Frequency 2x / week   PT Duration 8 weeks   PT Treatment/Interventions Therapeutic exercise;Balance training;Therapeutic activities;Gait training   PT Next Visit Plan strengthening and gait training and balance training   PT Home Exercise Plan hip abd with YTB hip flex with YTB   Consulted and Agree with Plan of Care Patient;Other (Comment)   care giver      Patient will benefit from skilled therapeutic intervention in order to improve the following deficits and impairments:  Impaired flexibility, Difficulty walking, Decreased safety awareness, Decreased balance, Decreased activity tolerance, Decreased mobility, Pain  Visit Diagnosis: Unsteadiness on feet  Difficulty in walking, not elsewhere classified     Problem List Patient Active Problem List   Diagnosis Date Noted  . New onset atrial fibrillation (HCC) 12/11/2015  . Altered mental state   . Congestive dilated cardiomyopathy (HCC)   . Dementia   . Headache 12/10/2015  . TIA (transient ischemic attack) 12/10/2015  . Influenza A 11/21/2015  . Ventricular tachycardia (HCC) 11/09/2015  . Constipation 09/12/2015  . Insomnia due to anxiety and fear 09/12/2015  . Depression, controlled 09/12/2015  . Bacterial UTI 08/27/2015  . DVT, lower extremity (HCC) 08/27/2015  . TBI (traumatic brain injury) (HCC) 08/22/2015  . ICH (intracerebral hemorrhage) (HCC)   . Abscess   . Hypomagnesemia   . SDH (subdural hematoma) (HCC)   . Boil of upper extremity   . Pneumonia due to Haemophilus influenzae (HCC)   . Chronic systolic CHF (congestive heart failure) (HCC)   . Acute on chronic renal failure (HCC)   . Hypokalemia   . Bright red blood per rectum   . Dysphagia   . Pressure ulcer 08/16/2015  . Acute respiratory failure with hypoxemia (HCC)   . Hypernatremia   . Subdural hematoma (HCC) 08/03/2015  . Lumbar radiculopathy 06/06/2015  . Spinal stenosis, lumbar region, with neurogenic claudication 04/02/2015  . Degenerative lumbar disc 02/15/2015  . Lumbar post-laminectomy syndrome 02/15/2015  . Facet syndrome, lumbar 02/15/2015  . Sacroiliac joint dysfunction 02/15/2015   Ezekiel InaKristine S Caldonia Leap, PT, DPT [place], Jamerson Vonbargen S 02/27/2016, 11:43 AM  [place] [facility] MAIN [facility] [address] [place], [place], [address] Phone:  [phone]   Fax:  [phone]  Name: Lisa [name] MRN: [id number] Date of Birth: 05/21/1927

## 2016-02-27 NOTE — Telephone Encounter (Signed)
Spoke to CrystalEddie and he will discard the old Rx that was given to patient. He is requesting the Seroquel be sent to pharmacy, his mother is having a lot of trouble sleeping at night lately.

## 2016-02-27 NOTE — Telephone Encounter (Signed)
Patient's son Mendel Ryderddie Johnston is returning Hospital doctorAmber and BoeingKen's phone call.

## 2016-02-27 NOTE — Telephone Encounter (Signed)
seroquel rx sent to [facility]

## 2016-02-27 NOTE — Telephone Encounter (Signed)
Son notified 

## 2016-02-27 NOTE — Therapy (Signed)
[place] [facility] MAIN [facility] SERVICES [address] [place], [place], [address] Phone: [phone]   Fax:  [phone]  Speech Language Pathology Treatment  Patient Details  Name: Lisa [name] MRN: [id number] Date of Birth: 1927/06/08 Referring Provider: Dr. Jocelyn Lamer  Encounter Date: 02/27/2016      End of Session - 02/27/16 1558    Visit Number 2   Number of Visits 13   Date for SLP Re-Evaluation 04/21/16   SLP Start Time 1000   SLP Stop Time  1100   SLP Time Calculation (min) 60 min   Activity Tolerance Patient tolerated treatment well      Past Medical History  Diagnosis Date  . Allergy   . Pelvis fracture (HCC)   . Wrist fracture, right   . Depression   . Spinal stenosis   . Osteoarthritis   . Hypertension   . Foot fracture     bilateral  . Brain bleed (HCC)   . V-tach (HCC)   . Chronic systolic CHF (congestive heart failure) (HCC)   . Renal disorder     Past Surgical History  Procedure Laterality Date  . Back surgery N/A   . Breast lumpectomy N/A     There were no vitals filed for this visit.      Subjective Assessment - 02/27/16 1557    Subjective [name] was alert and engaged throughout the session. She reports that she frequently forgets a word and it usually takes her 2-3 seconds to think of it. Her caregiver reports that she occasionally has "spells" during which she can't think of many words for 2-3 hours at a time. [name] also reports that she has constant saliva management issues which results in drooling from both corners of the mouth. She carries tissues to manage this and finds it bothersome. She is currently not sleeping well at night which she attributes to being removed from her sleep medication by her doctor.   Currently in Pain? No/denies               ADULT SLP TREATMENT - 02/27/16 0001    General Information   Behavior/Cognition Alert;Cooperative;Pleasant mood   Treatment Provided   Treatment  provided Cognitive-Linquistic   Pain Assessment   Pain Assessment No/denies pain   Cognitive-Linquistic Treatment   Treatment focused on Aphasia;Patient/family/caregiver education   Skilled Treatment MID LEVEL WORD FINDING: [name] was able to generate a noun or verb given a description with 98% accuracy independently. She was also able to complete common associations (i.e. peanut butter and. jelly) as well as answer basic questions with 100% accuracy without cues.   Assessment / Recommendations / Plan   Plan Continue with current plan of care   Progression Toward Goals   Progression toward goals Progressing toward goals          SLP Education - 02/27/16 1557    Education provided Yes   Education Details Word finding strategies   Person(s) Educated Patient;Caregiver(s)   Methods Explanation   Comprehension Verbalized understanding            SLP Long Term Goals - 02/26/16 1435    SLP LONG TERM GOAL #1   Title Patient will complete high level word finding tasks with 80% accuracy.   Time 6   Period Weeks   Status New   SLP LONG TERM GOAL #2   Title Patient will generate grammatical, fluent, and cogent sentence to complete abstract/complex linguistic task with  80% accuracy.   Time 6   Period Weeks   Status New   SLP LONG TERM GOAL #3   Title Patient will demonstrate functional cognitive-communication skills for independent completion of personal responsibilities.   Time 6   Period Weeks   Status New          Plan - 02/27/16 1558    Clinical Impression Statement Ms. Febles did not exhibit any atypical word-finding difficulties during the session. It was suggested that she and her caregivers keep a log of when multiple word finding difficulties occur in order to determine if there is a stressor which contributes to this. She was also asked, should she fell comfortable, to have one of her caregivers take a short video during one of her "spells" so this could be reviewed  by the SLP. Future sessions will include activities which require a higher cognitive load.   Speech Therapy Frequency 2x / week   Duration Other (comment)   Treatment/Interventions SLP instruction and feedback;Compensatory strategies;Patient/family education;Multimodal communcation approach;Language facilitation   Potential to Achieve Goals Good   Potential Considerations Ability to learn/carryover information;Co-morbidities;Cooperation/participation level;Medical prognosis;Previous level of function;Severity of impairments;Family/community support   SLP Home Exercise Plan Patient given list of games helpful in practicing word finding and expressive skills   Consulted and Agree with Plan of Care Patient;Family member/caregiver   Family Member Consulted Caregiver      Patient will benefit from skilled therapeutic intervention in order to improve the following deficits and impairments:   Cognitive communication deficit      G-Codes - 03-01-16 1437    Functional Assessment Tool Used Cognitive Linguistic Quick Test, Western Aphasia Battery Screening, clinical judgment   Functional Limitations Spoken language expressive   Spoken Language Expression Current Status [phone]) At least 20 percent but less than 40 percent impaired, limited or restricted   Spoken Language Expression Goal Status ([id number]) At least 1 percent but less than 20 percent impaired, limited or restricted      Problem List Patient Active Problem List   Diagnosis Date Noted  . New onset atrial fibrillation (HCC) 12/11/2015  . Altered mental state   . Congestive dilated cardiomyopathy (HCC)   . Dementia   . Headache 12/10/2015  . TIA (transient ischemic attack) 12/10/2015  . Influenza A 11/21/2015  . Ventricular tachycardia (HCC) 11/09/2015  . Constipation 09/12/2015  . Insomnia due to anxiety and fear 09/12/2015  . Depression, controlled 09/12/2015  . Bacterial UTI 08/27/2015  . DVT, lower extremity (HCC) 08/27/2015   . TBI (traumatic brain injury) (HCC) 08/22/2015  . ICH (intracerebral hemorrhage) (HCC)   . Abscess   . Hypomagnesemia   . SDH (subdural hematoma) (HCC)   . Boil of upper extremity   . Pneumonia due to Haemophilus influenzae (HCC)   . Chronic systolic CHF (congestive heart failure) (HCC)   . Acute on chronic renal failure (HCC)   . Hypokalemia   . Bright red blood per rectum   . Dysphagia   . Pressure ulcer 08/16/2015  . Acute respiratory failure with hypoxemia (HCC)   . Hypernatremia   . Subdural hematoma (HCC) 08/03/2015  . Lumbar radiculopathy 06/06/2015  . Spinal stenosis, lumbar region, with neurogenic claudication 04/02/2015  . Degenerative lumbar disc 02/15/2015  . Lumbar post-laminectomy syndrome 02/15/2015  . Facet syndrome, lumbar 02/15/2015  . Sacroiliac joint dysfunction 02/15/2015   Dollene Primrose, MS/CCC- SLP  Leandrew Koyanagi 02/27/2016, 4:00 PM  [place] [facility] MAIN [facility] SERVICES [phone]  [address] [place], [place], [address] Phone: [phone]   Fax:  [phone]   Name: Marita SnellenFaye K Erdman MRN: [id number] Date of Birth: 06/29/1927

## 2016-02-28 ENCOUNTER — Encounter: Payer: Medicare Other | Admitting: Physical Therapy

## 2016-03-03 ENCOUNTER — Encounter: Payer: Self-pay | Admitting: Speech Pathology

## 2016-03-03 ENCOUNTER — Encounter: Payer: Self-pay | Admitting: Physical Therapy

## 2016-03-03 ENCOUNTER — Ambulatory Visit: Payer: Medicare Other | Admitting: Speech Pathology

## 2016-03-03 ENCOUNTER — Ambulatory Visit: Payer: Medicare Other | Admitting: Physical Therapy

## 2016-03-03 DIAGNOSIS — R262 Difficulty in walking, not elsewhere classified: Secondary | ICD-10-CM

## 2016-03-03 DIAGNOSIS — R41841 Cognitive communication deficit: Secondary | ICD-10-CM

## 2016-03-03 DIAGNOSIS — R2681 Unsteadiness on feet: Secondary | ICD-10-CM

## 2016-03-03 NOTE — Therapy (Signed)
[place] [facility] [address] [place], [place], [address] Phone: [phone]   Fax:  [phone]  Speech Language Pathology Treatment  Patient Details  Name: Lisa SnellenFaye K [name] MRN: [id number] Date of Birth: 05/25/1927 Referring Provider: Dr. Jocelyn LamerZ [name]  Encounter Date: 03/03/2016      End of Session - 03/03/16 1312    Visit Number 3   Number of Visits 13   Date for SLP Re-Evaluation 04/21/16   SLP Start Time 1000   SLP Stop Time  1100   SLP Time Calculation (min) 60 min   Activity Tolerance Patient tolerated treatment well      Past Medical History  Diagnosis Date  . Allergy   . Pelvis fracture (HCC)   . Wrist fracture, right   . Depression   . Spinal stenosis   . Osteoarthritis   . Hypertension   . Foot fracture     bilateral  . Brain bleed (HCC)   . V-tach (HCC)   . Chronic systolic CHF (congestive heart failure) (HCC)   . Renal disorder     Past Surgical History  Procedure Laterality Date  . Back surgery N/A   . Breast lumpectomy N/A     There were no vitals filed for this visit.      Subjective Assessment - 03/03/16 1302    Subjective Lisa [name] was alert and engaged throughout the session. She states that her sleep continues to worsen and that she can no longer nap during the day which she had done previously. She notes that her word-finding ability is worse when she is excited or upset. Lisa [name] states that she feels like she has something stuck in her throat and that she and her home nurses have noticed that her voice is becoming more strained and hoarse over time.   Currently in Pain? No/denies               ADULT SLP TREATMENT - 03/03/16 0001    General Information   Behavior/Cognition Alert;Cooperative   Treatment Provided   Treatment provided Cognitive-Linquistic   Pain Assessment   Pain Assessment No/denies pain   Cognitive-Linquistic Treatment   Treatment focused on  Aphasia;Patient/family/caregiver education   Skilled Treatment Lisa [name] was able to successfully complete analogies with 95% accuracy independently and explain her logic 85% of the time with moderate cues from the clinician. She experienced 2 word finding difficulties during this activity both of which resolved in less than 5 seconds, as well as 2 phonemic paraphasias (i.e. camble/candle) which went unnoticed. When provided with 5 words she was able to determine which didn't belong and why with 100% accuracy independently. When provided with 3 words, she was able to determine the object being described with 100% accuracy independently. She did not exhibit any word finding difficulties or paraphasias during these activities. The clinician provided basic education regarding breathing and neck relaxation to Lisa [name] and advised her to monitor tension in her throat.   Assessment / Recommendations / Plan   Plan Continue with current plan of care   Progression Toward Goals   Progression toward goals Progressing toward goals          SLP Education - 03/03/16 1303    Education provided Yes   Education Details Breath support, word finding strategies   Person(s) Educated Patient;Caregiver(s)   Methods Explanation   Comprehension Verbalized understanding            SLP Long Term Goals -  02/26/16 1435    SLP LONG TERM GOAL #1   Title Patient will complete high level word finding tasks with 80% accuracy.   Time 6   Period Weeks   Status New   SLP LONG TERM GOAL #2   Title Patient will generate grammatical, fluent, and cogent sentence to complete abstract/complex linguistic task with 80% accuracy.   Time 6   Period Weeks   Status New   SLP LONG TERM GOAL #3   Title Patient will demonstrate functional cognitive-communication skills for independent completion of personal responsibilities.   Time 6   Period Weeks   Status New          Plan - 03/03/16 1313    Clinical Impression  Statement Lisa [name] continues to exhibit few word finding difficulties which are not abnormal. She has shown that she is able to successfully recover a word when provided enough time, and should be encouraged to do so. She will benefit from continued skilled intervention to determine if more severe language issues occur as well as to monitor her vocal quality.   Speech Therapy Frequency 2x / week   Duration Other (comment)   Treatment/Interventions SLP instruction and feedback;Compensatory strategies;Patient/family education;Multimodal communcation approach;Language facilitation   Potential to Achieve Goals Good   Potential Considerations Ability to learn/carryover information;Co-morbidities;Cooperation/participation level;Medical prognosis;Previous level of function;Severity of impairments;Family/community support   SLP Home Exercise Plan Patient given list of games helpful in practicing word finding and expressive skills   Consulted and Agree with Plan of Care Patient;Family member/caregiver   Family Member Consulted Caregiver      Patient will benefit from skilled therapeutic intervention in order to improve the following deficits and impairments:   Cognitive communication deficit    Problem List Patient Active Problem List   Diagnosis Date Noted  . New onset atrial fibrillation (HCC) 12/11/2015  . Altered mental state   . Congestive dilated cardiomyopathy (HCC)   . Dementia   . Headache 12/10/2015  . TIA (transient ischemic attack) 12/10/2015  . Influenza A 11/21/2015  . Ventricular tachycardia (HCC) 11/09/2015  . Constipation 09/12/2015  . Insomnia due to anxiety and fear 09/12/2015  . Depression, controlled 09/12/2015  . Bacterial UTI 08/27/2015  . DVT, lower extremity (HCC) 08/27/2015  . TBI (traumatic brain injury) (HCC) 08/22/2015  . ICH (intracerebral hemorrhage) (HCC)   . Abscess   . Hypomagnesemia   . SDH (subdural hematoma) (HCC)   . Boil of upper extremity   .  Pneumonia due to Haemophilus influenzae (HCC)   . Chronic systolic CHF (congestive heart failure) (HCC)   . Acute on chronic renal failure (HCC)   . Hypokalemia   . Bright red blood per rectum   . Dysphagia   . Pressure ulcer 08/16/2015  . Acute respiratory failure with hypoxemia (HCC)   . Hypernatremia   . Subdural hematoma (HCC) 08/03/2015  . Lumbar radiculopathy 06/06/2015  . Spinal stenosis, lumbar region, with neurogenic claudication 04/02/2015  . Degenerative lumbar disc 02/15/2015  . Lumbar post-laminectomy syndrome 02/15/2015  . Facet syndrome, lumbar 02/15/2015  . Sacroiliac joint dysfunction 02/15/2015    Randall An 03/03/2016, 1:17 PM   [facility] MAIN [facility] SERVICES [address] [place], [place], [address] Phone: [phone]   Fax:  [phone]   Name: AISLINN FELIZ MRN: [id number] Date of Birth: 05-14-1927

## 2016-03-03 NOTE — Therapy (Signed)
[place] [facility] [address] [place], [place], [address] Phone: [phone]   Fax:  [phone]  Physical Therapy Treatment  Patient Details  Name: Lisa SnellenFaye K [name] MRN: [id number] Date of Birth: 06/05/1927 Referring Provider: Faith RogueSWARTZ, ZACHARY T  Encounter Date: 03/03/2016      PT End of Session - 03/03/16 1121    Visit Number 4   Number of Visits 17   Date for PT Re-Evaluation 04/16/16   Authorization Type g codes   PT Start Time 1100   PT Stop Time 1138   PT Time Calculation (min) 38 min   Equipment Utilized During Treatment Gait belt   Activity Tolerance Patient tolerated treatment well;Patient limited by fatigue   Behavior During Therapy [facility] for tasks assessed/performed      Past Medical History  Diagnosis Date  . Allergy   . Pelvis fracture (HCC)   . Wrist fracture, right   . Depression   . Spinal stenosis   . Osteoarthritis   . Hypertension   . Foot fracture     bilateral  . Brain bleed (HCC)   . V-tach (HCC)   . Chronic systolic CHF (congestive heart failure) (HCC)   . Renal disorder     Past Surgical History  Procedure Laterality Date  . Back surgery N/A   . Breast lumpectomy N/A     There were no vitals filed for this visit.      Subjective Assessment - 03/03/16 1113    Subjective Pt reports she has been doing well other than she has difficulty sleeping.    Pertinent History anxiety, high blood pressure, heart disease, Osteoporosis   Limitations Walking;Standing   How long can you stand comfortably? Patient can stand for under 30 minutes   Patient Stated Goals to walk safely   Currently in Pain? No/denies     Therex:  TM x6 minutes 0.9 speed, incline 1% progressing to 2% for increased challenge of heel strike. Cues for heel to toe pattern and postural correction.  standing hip abd/ext/flex with YTB 3x 10   LAQ with YTB 3x 10 Seated hip abd/ER with YTB 3x 10 side stepping left and right in  parallel bars with YTB 10 feet x 3 sit to stand x 10 Heel/toe raises 2x 20, difficulty with toe raising on R   Moderate verbal cueing to improve posture and cueing to correctly perform exercises slowly with eccentric control, holding at end of range to increase motor firing of desired muscle to encourage fatigue.                           PT Education - 03/03/16 1115    Education provided Yes   Education Details continue HEP   Person(s) Educated Patient   Methods Explanation   Comprehension Verbalized understanding             PT Long Term Goals - 02/20/16 1322    PT LONG TERM GOAL #1   Title Patient will be independent in home exercise program to improve strength/mobility for better functional independence with ADLs.   Time 8   Period Weeks   Status New   PT LONG TERM GOAL #2   Title Patient (> 80 years old) will complete five times sit to stand test in < 15 seconds indicating an increased LE strength and improved balance.   Time 8   Period Weeks   Status New  PT LONG TERM GOAL #3   Title Patient will increase six minute walk test distance to >1000 for progression to community ambulator and improve gait ability   Time 8   Period Weeks   Status New   PT LONG TERM GOAL #4   Title Patient will reduce timed up and go to <11 seconds to reduce fall risk and demonstrate improved transfer/gait ability.   Time 8   Period Weeks   Status New               Plan - 03/03/16 1122    Clinical Impression Statement Pt continues to have difficulty with dragging R foot with walking. Unsteady with no UE support. YTB starting to become easier. Progress intensity as tolerated.   Rehab Potential Fair   PT Frequency 2x / week   PT Duration 8 weeks   PT Treatment/Interventions Therapeutic exercise;Balance training;Therapeutic activities;Gait training   PT Next Visit Plan progress strengthening and gait training and balance training   PT Home Exercise Plan hip  abd with YTB hip flex with YTB   Consulted and Agree with Plan of Care Patient;Other (Comment)  care giver      Patient will benefit from skilled therapeutic intervention in order to improve the following deficits and impairments:  Impaired flexibility, Difficulty walking, Decreased safety awareness, Decreased balance, Decreased activity tolerance, Decreased mobility, Pain  Visit Diagnosis: Unsteadiness on feet  Difficulty in walking, not elsewhere classified     Problem List Patient Active Problem List   Diagnosis Date Noted  . New onset atrial fibrillation (HCC) 12/11/2015  . Altered mental state   . Congestive dilated cardiomyopathy (HCC)   . Dementia   . Headache 12/10/2015  . TIA (transient ischemic attack) 12/10/2015  . Influenza A 11/21/2015  . Ventricular tachycardia (HCC) 11/09/2015  . Constipation 09/12/2015  . Insomnia due to anxiety and fear 09/12/2015  . Depression, controlled 09/12/2015  . Bacterial UTI 08/27/2015  . DVT, lower extremity (HCC) 08/27/2015  . TBI (traumatic brain injury) (HCC) 08/22/2015  . ICH (intracerebral hemorrhage) (HCC)   . Abscess   . Hypomagnesemia   . SDH (subdural hematoma) (HCC)   . Boil of upper extremity   . Pneumonia due to Haemophilus influenzae (HCC)   . Chronic systolic CHF (congestive heart failure) (HCC)   . Acute on chronic renal failure (HCC)   . Hypokalemia   . Bright red blood per rectum   . Dysphagia   . Pressure ulcer 08/16/2015  . Acute respiratory failure with hypoxemia (HCC)   . Hypernatremia   . Subdural hematoma (HCC) 08/03/2015  . Lumbar radiculopathy 06/06/2015  . Spinal stenosis, lumbar region, with neurogenic claudication 04/02/2015  . Degenerative lumbar disc 02/15/2015  . Lumbar post-laminectomy syndrome 02/15/2015  . Facet syndrome, lumbar 02/15/2015  . Sacroiliac joint dysfunction 02/15/2015    Adelene Idler, PT, DPT  03/03/2016, 1:15 PM [phone]  [facility] MAIN [facility] SERVICES [address] [place], [place], [address] Phone: [phone]   Fax:  [phone]  Name: Lisa [name] MRN: [id number] Date of Birth: January 14, 1927

## 2016-03-05 ENCOUNTER — Ambulatory Visit: Payer: Medicare Other | Admitting: Speech Pathology

## 2016-03-05 ENCOUNTER — Encounter: Payer: Self-pay | Admitting: Physical Therapy

## 2016-03-05 ENCOUNTER — Ambulatory Visit: Payer: Medicare Other | Admitting: Physical Therapy

## 2016-03-05 ENCOUNTER — Encounter: Payer: Self-pay | Admitting: Speech Pathology

## 2016-03-05 DIAGNOSIS — R41841 Cognitive communication deficit: Secondary | ICD-10-CM | POA: Diagnosis not present

## 2016-03-05 DIAGNOSIS — R262 Difficulty in walking, not elsewhere classified: Secondary | ICD-10-CM

## 2016-03-05 DIAGNOSIS — R2681 Unsteadiness on feet: Secondary | ICD-10-CM

## 2016-03-05 NOTE — Therapy (Signed)
[place] [facility] MAIN [facility] SERVICES [address] [place], [place], [address] Phone: [phone]   Fax:  [phone]  Speech Language Pathology Treatment  Patient Details  Name: Lisa [name] MRN: [id number] Date of Birth: 1926-09-24 Referring Provider: Dr. Jocelyn Lamer  Encounter Date: 03/05/2016      End of Session - 03/05/16 1648    Visit Number 4   Number of Visits 13   Date for SLP Re-Evaluation 04/21/16   SLP Start Time 1000   SLP Stop Time  1100   SLP Time Calculation (min) 60 min   Activity Tolerance Patient tolerated treatment well      Past Medical History  Diagnosis Date  . Allergy   . Pelvis fracture (HCC)   . Wrist fracture, right   . Depression   . Spinal stenosis   . Osteoarthritis   . Hypertension   . Foot fracture     bilateral  . Brain bleed (HCC)   . V-tach (HCC)   . Chronic systolic CHF (congestive heart failure) (HCC)   . Renal disorder     Past Surgical History  Procedure Laterality Date  . Back surgery N/A   . Breast lumpectomy N/A     There were no vitals filed for this visit.      Subjective Assessment - 03/05/16 1646    Subjective Lisa [name] was alert and engaged throughout the session. She states that her speech was especially poor last night when she became upset and was unable to think of her words for "at least 30 minutes".   Currently in Pain? No/denies               ADULT SLP TREATMENT - 03/05/16 0001    General Information   Behavior/Cognition Alert;Cooperative   Treatment Provided   Treatment provided Cognitive-Linquistic   Pain Assessment   Pain Assessment No/denies pain   Cognitive-Linquistic Treatment   Treatment focused on Aphasia;Patient/family/caregiver education   Skilled Treatment Breathing and neck relaxation exercises were reviewed. In response to earlier complaints of mild drooling, Ms. Joo was advised to get in the habit of sucking saliva towards the back of her mouth  using her lips regularly to avoid leaking. When presented with open ended analogies, she was able to complete them with 75% accuracy independently and explain her reasoning with 85% accuracy with min-mod cues from the clinician.   Assessment / Recommendations / Plan   Plan Continue with current plan of care   Progression Toward Goals   Progression toward goals Progressing toward goals          SLP Education - 03/05/16 1647    Education provided Yes   Education Details word finding strategies; breathing and stretching for vocal health   Person(s) Educated Patient   Methods Explanation;Demonstration   Comprehension Verbalized understanding;Returned demonstration            SLP Long Term Goals - 02/26/16 1435    SLP LONG TERM GOAL #1   Title Patient will complete high level word finding tasks with 80% accuracy.   Time 6   Period Weeks   Status New   SLP LONG TERM GOAL #2   Title Patient will generate grammatical, fluent, and cogent sentence to complete abstract/complex linguistic task with 80% accuracy.   Time 6   Period Weeks   Status New   SLP LONG TERM GOAL #3   Title Patient will demonstrate functional cognitive-communication skills for independent completion of personal responsibilities.  Time 6   Period Weeks   Status New          Plan - 03/05/16 1648    Clinical Impression Statement Lisa [name] shows progress in her ability to coherently and accurately describe her thinking with the words she would like. She will benefit from continued skilled intervention to determine if more severe language issues occur and to continue strengthening her word-finding ability.   Speech Therapy Frequency 2x / week   Duration Other (comment)   Treatment/Interventions SLP instruction and feedback;Compensatory strategies;Patient/family education;Multimodal communcation approach;Language facilitation   Potential to Achieve Goals Good   Potential Considerations Ability to  learn/carryover information;Co-morbidities;Cooperation/participation level;Medical prognosis;Previous level of function;Severity of impairments;Family/community support   SLP Home Exercise Plan Patient given list of games helpful in practicing word finding and expressive skills   Consulted and Agree with Plan of Care Patient;Family member/caregiver   Family Member Consulted Caregiver      Patient will benefit from skilled therapeutic intervention in order to improve the following deficits and impairments:   Cognitive communication deficit    Problem List Patient Active Problem List   Diagnosis Date Noted  . New onset atrial fibrillation (HCC) 12/11/2015  . Altered mental state   . Congestive dilated cardiomyopathy (HCC)   . Dementia   . Headache 12/10/2015  . TIA (transient ischemic attack) 12/10/2015  . Influenza A 11/21/2015  . Ventricular tachycardia (HCC) 11/09/2015  . Constipation 09/12/2015  . Insomnia due to anxiety and fear 09/12/2015  . Depression, controlled 09/12/2015  . Bacterial UTI 08/27/2015  . DVT, lower extremity (HCC) 08/27/2015  . TBI (traumatic brain injury) (HCC) 08/22/2015  . ICH (intracerebral hemorrhage) (HCC)   . Abscess   . Hypomagnesemia   . SDH (subdural hematoma) (HCC)   . Boil of upper extremity   . Pneumonia due to Haemophilus influenzae (HCC)   . Chronic systolic CHF (congestive heart failure) (HCC)   . Acute on chronic renal failure (HCC)   . Hypokalemia   . Bright red blood per rectum   . Dysphagia   . Pressure ulcer 08/16/2015  . Acute respiratory failure with hypoxemia (HCC)   . Hypernatremia   . Subdural hematoma (HCC) 08/03/2015  . Lumbar radiculopathy 06/06/2015  . Spinal stenosis, lumbar region, with neurogenic claudication 04/02/2015  . Degenerative lumbar disc 02/15/2015  . Lumbar post-laminectomy syndrome 02/15/2015  . Facet syndrome, lumbar 02/15/2015  . Sacroiliac joint dysfunction 02/15/2015    Lisa [name] Lisa [name] 03/05/2016,  4:49 PM   [facility] [address] [place], [place], [address] Phone: [phone]   Fax:  [phone]   Name: Marita SnellenFaye K Rue MRN: [id number] Date of Birth: 09/05/1927

## 2016-03-05 NOTE — Therapy (Signed)
[place] [facility] MAIN [facility] SERVICES [address] [place], [place], [address] Phone: [phone]   Fax:  [phone]  Physical Therapy Treatment  Patient Details  Name: Lisa [name] MRN: [id number] Date of Birth: 04-17-1927 Referring Provider: Faith Rogue T  Encounter Date: 03/05/2016      PT End of Session - 03/05/16 1123    Visit Number 5   Number of Visits 17   Date for PT Re-Evaluation 05-02-2016   Authorization Type g codes   PT Start Time 1101   PT Stop Time 1141   PT Time Calculation (min) 40 min   Equipment Utilized During Treatment Gait belt   Activity Tolerance Patient tolerated treatment well   Behavior During Therapy [facility] for tasks assessed/performed      Past Medical History  Diagnosis Date  . Allergy   . Pelvis fracture (HCC)   . Wrist fracture, right   . Depression   . Spinal stenosis   . Osteoarthritis   . Hypertension   . Foot fracture     bilateral  . Brain bleed (HCC)   . V-tach (HCC)   . Chronic systolic CHF (congestive heart failure) (HCC)   . Renal disorder     Past Surgical History  Procedure Laterality Date  . Back surgery N/A   . Breast lumpectomy N/A     There were no vitals filed for this visit.      Subjective Assessment - 03/05/16 1103    Subjective (p) Pt is doing well other than having trouble sleeping still. No falls.   Pertinent History (p) anxiety, high blood pressure, heart disease, Osteoporosis   Limitations (p) Walking;Standing   How long can you stand comfortably? (p) Patient can stand for under 30 minutes   Patient Stated Goals (p) to walk safely   Currently in Pain? (p) No/denies      Therex: standing hip abd/ext/flex with RTB 2x 10   Seated clamshell with RTB 2x10 LAQs with RTB 2x10 sit to stand x 10 Heel/toe raises 2x 10, difficulty with toe raising on R   Cues for proper technique of exercises to target appropriate muscles and slow eccentric  contractions.   Neuro: Resisted walking with Matrix  F/B, L/R, R/L, B/F x2 each with 2.5 plate Cues for increased BOS and weight shifting to improve balance.   Gait: Dynamic gait without AD with min A x250 ft and 180ft with head turns plus cognitive task to challenge higher level balance.   Cues for heel to toe gait and taking her time to reduce risk of LOB.                           PT Education - 03/05/16 1122    Education provided Yes   Education Details increase BOS for improves stability in standing   Person(s) Educated Patient   Methods Explanation;Demonstration   Comprehension Verbalized understanding;Returned demonstration             PT Long Term Goals - 02/20/16 1322    PT LONG TERM GOAL #1   Title Patient will be independent in home exercise program to improve strength/mobility for better functional independence with ADLs.   Time 8   Period Weeks   Status New   PT LONG TERM GOAL #2   Title Patient (> 43 years old) will complete five times sit to stand test in < 15 seconds indicating an increased LE strength and  improved balance.   Time 8   Period Weeks   Status New   PT LONG TERM GOAL #3   Title Patient will increase six minute walk test distance to >1000 for progression to community ambulator and improve gait ability   Time 8   Period Weeks   Status New   PT LONG TERM GOAL #4   Title Patient will reduce timed up and go to <11 seconds to reduce fall risk and demonstrate improved transfer/gait ability.   Time 8   Period Weeks   Status New               Plan - 03/05/16 1128    Clinical Impression Statement Pt able to perform higher level balance activities today, as well as increased resistance for LE strengthening. During balance tasks she has the most difficulty with side to side and narrow BOS activities.    Rehab Potential Fair   PT Frequency 2x / week   PT Duration 8 weeks   PT Treatment/Interventions Therapeutic  exercise;Balance training;Therapeutic activities;Gait training   PT Next Visit Plan progress strengthening and gait training and balance training   PT Home Exercise Plan hip abd with YTB hip flex with YTB   Consulted and Agree with Plan of Care Patient;Other (Comment)  care giver      Patient will benefit from skilled therapeutic intervention in order to improve the following deficits and impairments:  Impaired flexibility, Difficulty walking, Decreased safety awareness, Decreased balance, Decreased activity tolerance, Decreased mobility, Pain  Visit Diagnosis: Unsteadiness on feet  Difficulty in walking, not elsewhere classified     Problem List Patient Active Problem List   Diagnosis Date Noted  . New onset atrial fibrillation (HCC) 12/11/2015  . Altered mental state   . Congestive dilated cardiomyopathy (HCC)   . Dementia   . Headache 12/10/2015  . TIA (transient ischemic attack) 12/10/2015  . Influenza A 11/21/2015  . Ventricular tachycardia (HCC) 11/09/2015  . Constipation 09/12/2015  . Insomnia due to anxiety and fear 09/12/2015  . Depression, controlled 09/12/2015  . Bacterial UTI 08/27/2015  . DVT, lower extremity (HCC) 08/27/2015  . TBI (traumatic brain injury) (HCC) 08/22/2015  . ICH (intracerebral hemorrhage) (HCC)   . Abscess   . Hypomagnesemia   . SDH (subdural hematoma) (HCC)   . Boil of upper extremity   . Pneumonia due to Haemophilus influenzae (HCC)   . Chronic systolic CHF (congestive heart failure) (HCC)   . Acute on chronic renal failure (HCC)   . Hypokalemia   . Bright red blood per rectum   . Dysphagia   . Pressure ulcer 08/16/2015  . Acute respiratory failure with hypoxemia (HCC)   . Hypernatremia   . Subdural hematoma (HCC) 08/03/2015  . Lumbar radiculopathy 06/06/2015  . Spinal stenosis, lumbar region, with neurogenic claudication 04/02/2015  . Degenerative lumbar disc 02/15/2015  . Lumbar post-laminectomy syndrome 02/15/2015  . Facet  syndrome, lumbar 02/15/2015  . Sacroiliac joint dysfunction 02/15/2015    Adelene IdlerMindy Jo Vivyan Biggers, PT, DPT  03/05/2016, 11:44 AM [phone]  [facility] [address] [place], [place], [address] Phone: [phone]   Fax:  [phone]  Name: Lisa [name] MRN: [id number] Date of Birth: 06/05/1927

## 2016-03-10 ENCOUNTER — Ambulatory Visit: Payer: Medicare Other | Admitting: Speech Pathology

## 2016-03-10 ENCOUNTER — Ambulatory Visit: Payer: Medicare Other | Admitting: Physical Therapy

## 2016-03-10 ENCOUNTER — Encounter: Payer: Self-pay | Admitting: Speech Pathology

## 2016-03-10 ENCOUNTER — Encounter: Payer: Self-pay | Admitting: Physical Therapy

## 2016-03-10 DIAGNOSIS — R41841 Cognitive communication deficit: Secondary | ICD-10-CM | POA: Diagnosis not present

## 2016-03-10 DIAGNOSIS — R2681 Unsteadiness on feet: Secondary | ICD-10-CM

## 2016-03-10 DIAGNOSIS — R262 Difficulty in walking, not elsewhere classified: Secondary | ICD-10-CM

## 2016-03-10 NOTE — Therapy (Signed)
[place] [facility] [address] [place], [place], [address] Phone: [phone]   Fax:  [phone]  Physical Therapy Treatment  Patient Details  Name: Lisa [name] MRN: [id number] Date of Birth: 02/21/1927 Referring Provider: Faith RogueSWARTZ, ZACHARY T  Encounter Date: 03/10/2016      PT End of Session - 03/10/16 1124    Visit Number 6   Number of Visits 17   Date for PT Re-Evaluation 04/16/16   Authorization Type g codes   PT Start Time 1102   PT Stop Time 1147   PT Time Calculation (min) 45 min   Equipment Utilized During Treatment Gait belt   Activity Tolerance Patient tolerated treatment well   Behavior During Therapy [facility] for tasks assessed/performed      Past Medical History  Diagnosis Date  . Allergy   . Pelvis fracture (HCC)   . Wrist fracture, right   . Depression   . Spinal stenosis   . Osteoarthritis   . Hypertension   . Foot fracture     bilateral  . Brain bleed (HCC)   . V-tach (HCC)   . Chronic systolic CHF (congestive heart failure) (HCC)   . Renal disorder     Past Surgical History  Procedure Laterality Date  . Back surgery N/A   . Breast lumpectomy N/A     There were no vitals filed for this visit.      Subjective Assessment - 03/10/16 1102    Subjective Pt reports she is doing well.  Continues to have trouble sleeping. She feels her strength is improving.   Pertinent History anxiety, high blood pressure, heart disease, Osteoporosis   Limitations Walking;Standing   How long can you stand comfortably? Patient can stand for under 30 minutes   Patient Stated Goals to walk safely   Currently in Pain? No/denies      Therex:  standing hip abd/ext/flex with GTB 2x 10   Seated clamshell with GTB 2x10 LAQs with GTB 2x10 sit to stand 2x 10 Heel/toe raises 2x 10, difficulty with toe raising on R   Cues for proper technique of exercises to target appropriate muscles and slow eccentric  contractions.   Neuro:  Resisted walking with Matrix             F/B, L/R, R/L, B/F x2 each with 7.5 plate  Occasional LOB with self correction  SLS in corner 2x20 seconds without UE support Cues for increased BOS and weight shifting to improve balance.  Provided HEP for SLS. See pt instructions.   Gait:  TM x5 minutes at 1.0 speed and 1% elevation without UE support and occasional 1 UE support  Cues for heel to toe, control with R LE, increased step length and postural correction.                            PT Education - 03/10/16 1123    Education provided Yes   Education Details SLS HEP   Person(s) Educated Patient   Methods Explanation;Demonstration;Handout   Comprehension Verbalized understanding;Returned demonstration             PT Long Term Goals - 02/20/16 1322    PT LONG TERM GOAL #1   Title Patient will be independent in home exercise program to improve strength/mobility for better functional independence with ADLs.   Time 8   Period Weeks   Status New   PT LONG TERM GOAL #  2   Title Patient (> 45 years old) will complete five times sit to stand test in < 15 seconds indicating an increased LE strength and improved balance.   Time 8   Period Weeks   Status New   PT LONG TERM GOAL #3   Title Patient will increase six minute walk test distance to >1000 for progression to community ambulator and improve gait ability   Time 8   Period Weeks   Status New   PT LONG TERM GOAL #4   Title Patient will reduce timed up and go to <11 seconds to reduce fall risk and demonstrate improved transfer/gait ability.   Time 8   Period Weeks   Status New               Plan - 03/10/16 1138    Clinical Impression Statement Pt gradually progressing towards functional goals. Higher level balance is her primary deficits. During ambulation pt still has difficulty with control of the R foot especially with DF. Continued high level balance and  strength training will be beneficial for progressing further towards goals.   Rehab Potential Fair   PT Frequency 2x / week   PT Duration 8 weeks   PT Treatment/Interventions Therapeutic exercise;Balance training;Therapeutic activities;Gait training   PT Next Visit Plan progress strengthening and gait training and balance training   PT Home Exercise Plan hip abd with YTB hip flex with YTB   Consulted and Agree with Plan of Care Patient;Other (Comment)  care giver      Patient will benefit from skilled therapeutic intervention in order to improve the following deficits and impairments:  Impaired flexibility, Difficulty walking, Decreased safety awareness, Decreased balance, Decreased activity tolerance, Decreased mobility, Pain  Visit Diagnosis: Unsteadiness on feet  Difficulty in walking, not elsewhere classified     Problem List Patient Active Problem List   Diagnosis Date Noted  . New onset atrial fibrillation (HCC) 12/11/2015  . Altered mental state   . Congestive dilated cardiomyopathy (HCC)   . Dementia   . Headache 12/10/2015  . TIA (transient ischemic attack) 12/10/2015  . Influenza A 11/21/2015  . Ventricular tachycardia (HCC) 11/09/2015  . Constipation 09/12/2015  . Insomnia due to anxiety and fear 09/12/2015  . Depression, controlled 09/12/2015  . Bacterial UTI 08/27/2015  . DVT, lower extremity (HCC) 08/27/2015  . TBI (traumatic brain injury) (HCC) 08/22/2015  . ICH (intracerebral hemorrhage) (HCC)   . Abscess   . Hypomagnesemia   . SDH (subdural hematoma) (HCC)   . Boil of upper extremity   . Pneumonia due to Haemophilus influenzae (HCC)   . Chronic systolic CHF (congestive heart failure) (HCC)   . Acute on chronic renal failure (HCC)   . Hypokalemia   . Bright red blood per rectum   . Dysphagia   . Pressure ulcer 08/16/2015  . Acute respiratory failure with hypoxemia (HCC)   . Hypernatremia   . Subdural hematoma (HCC) 08/03/2015  . Lumbar  radiculopathy 06/06/2015  . Spinal stenosis, lumbar region, with neurogenic claudication 04/02/2015  . Degenerative lumbar disc 02/15/2015  . Lumbar post-laminectomy syndrome 02/15/2015  . Facet syndrome, lumbar 02/15/2015  . Sacroiliac joint dysfunction 02/15/2015    Adelene Idler, PT, DPT  03/10/2016, 11:54 AM [phone]  [facility] MAIN [facility] SERVICES [address] [place], [place], [address] Phone: [phone]   Fax:  [phone]  Name: Lisa [name] MRN: [id number] Date of Birth: [birth year]

## 2016-03-10 NOTE — Therapy (Signed)
[place] MAIN [facility] SERVICES [address] [place], [place], [address] Phone: [phone]   Fax:  [phone]  Speech Language Pathology Treatment  Patient Details  Name: Lisa [name] MRN: [id number] Date of Birth: 1926-11-30 Referring Provider: Dr. Jocelyn Lamer  Encounter Date: 03/10/2016      End of Session - 03/10/16 1323    Visit Number 5   Number of Visits 13   Date for SLP Re-Evaluation 04/21/16   SLP Start Time 0955   SLP Stop Time  1055   SLP Time Calculation (min) 60 min   Activity Tolerance Patient tolerated treatment well      Past Medical History  Diagnosis Date  . Allergy   . Pelvis fracture (HCC)   . Wrist fracture, right   . Depression   . Spinal stenosis   . Osteoarthritis   . Hypertension   . Foot fracture     bilateral  . Brain bleed (HCC)   . V-tach (HCC)   . Chronic systolic CHF (congestive heart failure) (HCC)   . Renal disorder     Past Surgical History  Procedure Laterality Date  . Back surgery N/A   . Breast lumpectomy N/A     There were no vitals filed for this visit.      Subjective Assessment - 03/10/16 1321    Subjective Lisa [name] was alert and engaged throughout the session. She states that she constantly feels like she has something stuck in her lower throat. She was unable to sing at church this week which she reports is a new development.   Currently in Pain? No/denies               ADULT SLP TREATMENT - 03/10/16 0001    General Information   Behavior/Cognition Alert;Cooperative   Treatment Provided   Treatment provided Cognitive-Linquistic   Pain Assessment   Pain Assessment No/denies pain   Cognitive-Linquistic Treatment   Treatment focused on Aphasia;Patient/family/caregiver education   Skilled Treatment Neck and throat stretches and breathing exercises were reviewed, and written materials which lead the patient through each technique were provided. Lisa [name] exhibited  several phonemic and semantic paraphasias during the session which went unnoticed to her. MEMORY: The clinician told Lisa [name] an animal color and name and was asked to remember that. She was able to repeat it back to the clinician immediately, but was unable to remember any aspect of it 20 minutes later except that she had been asked to remember something. WORD FINDING: When provided with 5 words, Lisa [name] was able to state which did not belong and why 85% of the time with occasional repetitions from the clinician. She was able to complete open ended analogies with 100% accuracy and explain her reasoning in a sentence with 85% accuracy with moderate cues from the clinician (open ended questions, providing a model). When provided with two items, Lisa [name] was able to name how they were the same, different, and think of one more object that could go with them with 100% accuracy with minimal cues from the clinician. This decreased to 75% when asked for more than one item or comparison. During this activity she had difficulty keeping track of what work she had already completed, even when provided with a notecard to mark her place.   Assessment / Recommendations / Plan   Plan Continue with current plan of care   Progression Toward Goals   Progression toward goals Progressing toward goals  SLP Education - 03/10/16 1322    Education provided Yes   Education Details Voice relaxation exercises; word finding strategies   Person(s) Educated Patient   Methods Explanation   Comprehension Verbalized understanding            SLP Long Term Goals - 02/26/16 1435    SLP LONG TERM GOAL #1   Title Patient will complete high level word finding tasks with 80% accuracy.   Time 6   Period Weeks   Status New   SLP LONG TERM GOAL #2   Title Patient will generate grammatical, fluent, and cogent sentence to complete abstract/complex linguistic task with 80% accuracy.   Time 6   Period Weeks   Status  New   SLP LONG TERM GOAL #3   Title Patient will demonstrate functional cognitive-communication skills for independent completion of personal responsibilities.   Time 6   Period Weeks   Status New          Plan - 03/10/16 1323    Clinical Impression Statement Lisa [name] continues to show no abnormal word finding difficulties during ST. She appears more limited by her memory and attention, as indicated by her inability to track which activities have been completed and the subsequent frequent repetitions of ST activities. She will benefit from continued skilled intervention to monitor her voice and identify areas of communication breakdown.   Speech Therapy Frequency 2x / week   Duration Other (comment)   Treatment/Interventions SLP instruction and feedback;Compensatory strategies;Patient/family education;Multimodal communcation approach;Language facilitation   Potential to Achieve Goals Good   Potential Considerations Ability to learn/carryover information;Co-morbidities;Cooperation/participation level;Medical prognosis;Previous level of function;Severity of impairments;Family/community support   SLP Home Exercise Plan Patient given list of games helpful in practicing word finding and expressive skills   Consulted and Agree with Plan of Care Patient      Patient will benefit from skilled therapeutic intervention in order to improve the following deficits and impairments:   Cognitive communication deficit    Problem List Patient Active Problem List   Diagnosis Date Noted  . New onset atrial fibrillation (HCC) 12/11/2015  . Altered mental state   . Congestive dilated cardiomyopathy (HCC)   . Dementia   . Headache 12/10/2015  . TIA (transient ischemic attack) 12/10/2015  . Influenza A 11/21/2015  . Ventricular tachycardia (HCC) 11/09/2015  . Constipation 09/12/2015  . Insomnia due to anxiety and fear 09/12/2015  . Depression, controlled 09/12/2015  . Bacterial UTI 08/27/2015  .  DVT, lower extremity (HCC) 08/27/2015  . TBI (traumatic brain injury) (HCC) 08/22/2015  . ICH (intracerebral hemorrhage) (HCC)   . Abscess   . Hypomagnesemia   . SDH (subdural hematoma) (HCC)   . Boil of upper extremity   . Pneumonia due to Haemophilus influenzae (HCC)   . Chronic systolic CHF (congestive heart failure) (HCC)   . Acute on chronic renal failure (HCC)   . Hypokalemia   . Bright red blood per rectum   . Dysphagia   . Pressure ulcer 08/16/2015  . Acute respiratory failure with hypoxemia (HCC)   . Hypernatremia   . Subdural hematoma (HCC) 08/03/2015  . Lumbar radiculopathy 06/06/2015  . Spinal stenosis, lumbar region, with neurogenic claudication 04/02/2015  . Degenerative lumbar disc 02/15/2015  . Lumbar post-laminectomy syndrome 02/15/2015  . Facet syndrome, lumbar 02/15/2015  . Sacroiliac joint dysfunction 02/15/2015    Lisa [name] 03/10/2016, 1:24 PM  [facility] [facility] [address] [place], [place],  [address] Phone: [phone]   Fax:  [phone]   Name: Lisa [name] MRN: [id number] Date of Birth: Jun 23, 1927

## 2016-03-10 NOTE — Patient Instructions (Signed)
Provided HEP: SLS in corner with back towards wall and FWW infront. Try to have no UE support. Make sure someone stands next to pt when performing. 5 times each LE for up to 20 seconds. Created on www.hep2go.com Discussed with pt and her home aid.

## 2016-03-12 ENCOUNTER — Ambulatory Visit: Payer: Medicare Other | Admitting: Physical Therapy

## 2016-03-12 ENCOUNTER — Ambulatory Visit: Payer: Medicare Other | Admitting: Speech Pathology

## 2016-03-12 ENCOUNTER — Encounter: Payer: Self-pay | Admitting: Physical Therapy

## 2016-03-12 ENCOUNTER — Encounter: Payer: Self-pay | Admitting: Speech Pathology

## 2016-03-12 DIAGNOSIS — R2681 Unsteadiness on feet: Secondary | ICD-10-CM

## 2016-03-12 DIAGNOSIS — R41841 Cognitive communication deficit: Secondary | ICD-10-CM | POA: Diagnosis not present

## 2016-03-12 NOTE — Therapy (Signed)
[facility] [address] [place], [place], [address] Phone: [phone]   Fax:  [phone]  Speech Language Pathology Treatment  Lisa [name] Details  Name: Lisa SnellenFaye K [name] MRN: [id number] Date of Birth: 07/21/1927 Referring Provider: Dr. Jocelyn LamerZ Swartz  Encounter Date: 03/12/2016      End of Session - 03/12/16 1318    Visit Number 6   Number of Visits 13   Date for SLP Re-Evaluation 04/21/16   SLP Start Time 1000   SLP Stop Time  1100   SLP Time Calculation (min) 60 min   Activity Tolerance Lisa [name] tolerated treatment well      Past Medical History  Diagnosis Date  . Allergy   . Pelvis fracture (HCC)   . Wrist fracture, right   . Depression   . Spinal stenosis   . Osteoarthritis   . Hypertension   . Foot fracture     bilateral  . Brain bleed (HCC)   . V-tach (HCC)   . Chronic systolic CHF (congestive heart failure) (HCC)   . Renal disorder     Past Surgical History  Procedure Laterality Date  . Back surgery N/A   . Breast lumpectomy N/A     There were no vitals filed for this visit.      Subjective Assessment - 03/12/16 1318    Subjective Lisa [name] was alert and engaged throughout the session. She reports that she spent about 30 minutes completing the stretches and breathing exercises which were provided.   Currently in Pain? No/denies               ADULT SLP TREATMENT - 03/12/16 0001    General Information   Behavior/Cognition Alert;Cooperative   Treatment Provided   Treatment provided Cognitive-Linquistic   Pain Assessment   Pain Assessment No/denies pain   Cognitive-Linquistic Treatment   Treatment focused on Aphasia;Lisa [name]/family/caregiver education   Skilled Treatment HIGH LEVEL WORD FINDING: When provided with a topic Lisa [name] was able to create a grammatical, coherent sentence 100% of the time independently. During this activity she experienced several semantic paraphasias and repeated the  same activity once. During a game of Pictionary, Lisa [name] was able to participate fully. She had 2 word finding difficulties but was able to resolve them both in less than 5 seconds. MEMORY At the beginning of the session Lisa [name] was asked to remember a sentence. She was able to repeat the sentence immediately and again 30 minutes later without difficulty.   Assessment / Recommendations / Plan   Plan Continue with current plan of care   Progression Toward Goals   Progression toward goals Progressing toward goals          SLP Education - 03/12/16 1318    Education provided Yes   Education Details Word finding strategies   Person(s) Educated Lisa [name]   Methods Explanation   Comprehension Verbalized understanding            SLP Long Term Goals - 02/26/16 1435    SLP LONG TERM GOAL #1   Title Lisa [name] will complete high level word finding tasks with 80% accuracy.   Time 6   Period Weeks   Status New   SLP LONG TERM GOAL #2   Title Lisa [name] will generate grammatical, fluent, and cogent sentence to complete abstract/complex linguistic task with 80% accuracy.   Time 6   Period Weeks   Status New   SLP LONG TERM GOAL #3   Title  Lisa [name] will demonstrate functional cognitive-communication skills for independent completion of personal responsibilities.   Time 6   Period Weeks   Status New          Plan - 03/12/16 1318    Clinical Impression Statement Lisa [name] continues to learn strategies to assist with her occasional word finding difficulties. She will benefit from continued skilled intervention to develop [name] effective home-exercise plan.   Speech Therapy Frequency 2x / week   Duration Other (comment)   Treatment/Interventions SLP instruction and feedback;Compensatory strategies;Lisa [name]/family education;Multimodal communcation approach;Language facilitation   Potential to Achieve Goals Good   Potential Considerations Ability to learn/carryover  information;Co-morbidities;Cooperation/participation level;Medical prognosis;Previous level of function;Severity of impairments;Family/community support   SLP Home Exercise Plan Lisa [name] given list of games helpful in practicing word finding and expressive skills   Consulted and Agree with Plan of Care Lisa [name];Family member/caregiver   Family Member Consulted Caregiver      Lisa [name] will benefit from skilled therapeutic intervention in order to improve the following deficits and impairments:   Cognitive communication deficit    Problem List Lisa [name] Active Problem List   Diagnosis Date Noted  . New onset atrial fibrillation (HCC) 12/11/2015  . Altered mental state   . Congestive dilated cardiomyopathy (HCC)   . Dementia   . Headache 12/10/2015  . TIA (transient ischemic attack) 12/10/2015  . Influenza A 11/21/2015  . Ventricular tachycardia (HCC) 11/09/2015  . Constipation 09/12/2015  . Insomnia due to anxiety and fear 09/12/2015  . Depression, controlled 09/12/2015  . Bacterial UTI 08/27/2015  . DVT, lower extremity (HCC) 08/27/2015  . TBI (traumatic brain injury) (HCC) 08/22/2015  . ICH (intracerebral hemorrhage) (HCC)   . Abscess   . Hypomagnesemia   . SDH (subdural hematoma) (HCC)   . Boil of upper extremity   . Pneumonia due to Haemophilus influenzae (HCC)   . Chronic systolic CHF (congestive heart failure) (HCC)   . Acute on chronic renal failure (HCC)   . Hypokalemia   . Bright red blood per rectum   . Dysphagia   . Pressure ulcer 08/16/2015  . Acute respiratory failure with hypoxemia (HCC)   . Hypernatremia   . Subdural hematoma (HCC) 08/03/2015  . Lumbar radiculopathy 06/06/2015  . Spinal stenosis, lumbar region, with neurogenic claudication 04/02/2015  . Degenerative lumbar disc 02/15/2015  . Lumbar post-laminectomy syndrome 02/15/2015  . Facet syndrome, lumbar 02/15/2015  . Sacroiliac joint dysfunction 02/15/2015    Lisa [name] 03/12/2016, 1:19 PM  [facility] MAIN [facility] SERVICES [address] [place], [place], [address] Phone: [phone]   Fax:  [phone]   Name: Lisa [name] MRN: [id number] Date of Birth: 1927-04-24

## 2016-03-12 NOTE — Therapy (Signed)
[facility] MA[facility] [place], [place], [address] Phone: [phone]   Fax:  [phone]  Physical Therapy Treatment  Patient Details  Name: Lisa [name] MRN: [id number] Date of Birth: 1927-03-25 Referring Provider: Faith Rogue T  Encounter Date: 03/12/2016      PT End of Session - 03/12/16 1039    Visit Number 7   Number of Visits 17   Date for PT Re-Evaluation 04/18/16   Authorization Type g codes   PT Start Time 1100   PT Stop Time 1145   PT Time Calculation (min) 45 min   Equipment Utilized During Treatment Gait belt   Activity Tolerance Patient tolerated treatment well   Behavior During Therapy [facility] for tasks assessed/performed      Past Medical History  Diagnosis Date  . Allergy   . Pelvis fracture (HCC)   . Wrist fracture, right   . Depression   . Spinal stenosis   . Osteoarthritis   . Hypertension   . Foot fracture     bilateral  . Brain bleed (HCC)   . V-tach (HCC)   . Chronic systolic CHF (congestive heart failure) (HCC)   . Renal disorder     Past Surgical History  Procedure Laterality Date  . Back surgery N/A   . Breast lumpectomy N/A     There were no vitals filed for this visit.      Subjective Assessment - 03/12/16 1038    Subjective Pt reports she is doing well.  Continues to have trouble sleeping. She feels her strength is improving.   Pertinent History anxiety, high blood pressure, heart disease, Osteoporosis   Limitations Walking;Standing   How long can you stand comfortably? Patient can stand for under 30 minutes   Patient Stated Goals to walk safely      Balance training: Four square fwd/bwd/diagonals x 10 Stepping fwd from blue foam/ side stepping from blue foam x 20 left and right Toe tapping 6 inch stool with CGA Ambulation without AD and CGA for 50 feet with turns and small spaces Side stepping left and right with HHA and CGA 20 feet   Therapeutic  exercise: Leg press 90 lbs x 20 x 3, heel raises 90 lbs x 20 x 3 Heel raises standing x 20 x 2 4 way hip RTB x 20 Pt requires mod verbal and tactile cues for proper exercise performance. Pt requires CGA to move between equipment and for transfers and has reports of back fatigue and some minimal soreness that decreases with rest.                            PT Education - 03/12/16 1038    Education provided Yes   Education Details safety with mobility   Person(s) Educated Patient   Methods Explanation   Comprehension Verbalized understanding             PT Long Term Goals - 02/20/16 1322    PT LONG TERM GOAL #1   Title Patient will be independent in home exercise program to improve strength/mobility for better functional independence with ADLs.   Time 8   Period Weeks   Status New   PT LONG TERM GOAL #2   Title Patient (> 2 years old) will complete five times sit to stand test in < 15 seconds indicating an increased LE strength and improved balance.   Time 8  Period Weeks   Status New   PT LONG TERM GOAL #3   Title Patient will increase six minute walk test distance to >1000 for progression to community ambulator and improve gait ability   Time 8   Period Weeks   Status New   PT LONG TERM GOAL #4   Title Patient will reduce timed up and go to <11 seconds to reduce fall risk and demonstrate improved transfer/gait ability.   Time 8   Period Weeks   Status New               Plan - 03/12/16 1122    Clinical Impression Statement Patient has decreased dynamic standing balance and unsteady gait during stepping challenges and has fatigue with exercises and some back discomfort during  prolonged standing.    Rehab Potential Fair   PT Frequency 2x / week   PT Duration 8 weeks   PT Treatment/Interventions Therapeutic exercise;Balance training;Therapeutic activities;Gait training   PT Next Visit Plan progress strengthening and gait training and  balance training   PT Home Exercise Plan hip abd with YTB hip flex with YTB   Consulted and Agree with Plan of Care Patient;Other (Comment)  care giver      Patient will benefit from skilled therapeutic intervention in order to improve the following deficits and impairments:  Impaired flexibility, Difficulty walking, Decreased safety awareness, Decreased balance, Decreased activity tolerance, Decreased mobility, Pain  Visit Diagnosis: Unsteadiness on feet     Problem List Patient Active Problem List   Diagnosis Date Noted  . New onset atrial fibrillation (HCC) 12/11/2015  . Altered mental state   . Congestive dilated cardiomyopathy (HCC)   . Dementia   . Headache 12/10/2015  . TIA (transient ischemic attack) 12/10/2015  . Influenza A 11/21/2015  . Ventricular tachycardia (HCC) 11/09/2015  . Constipation 09/12/2015  . Insomnia due to anxiety and fear 09/12/2015  . Depression, controlled 09/12/2015  . Bacterial UTI 08/27/2015  . DVT, lower extremity (HCC) 08/27/2015  . TBI (traumatic brain injury) (HCC) 08/22/2015  . ICH (intracerebral hemorrhage) (HCC)   . Abscess   . Hypomagnesemia   . SDH (subdural hematoma) (HCC)   . Boil of upper extremity   . Pneumonia due to Haemophilus influenzae (HCC)   . Chronic systolic CHF (congestive heart failure) (HCC)   . Acute on chronic renal failure (HCC)   . Hypokalemia   . Bright red blood per rectum   . Dysphagia   . Pressure ulcer 08/16/2015  . Acute respiratory failure with hypoxemia (HCC)   . Hypernatremia   . Subdural hematoma (HCC) 08/03/2015  . Lumbar radiculopathy 06/06/2015  . Spinal stenosis, lumbar region, with neurogenic claudication 04/02/2015  . Degenerative lumbar disc 02/15/2015  . Lumbar post-laminectomy syndrome 02/15/2015  . Facet syndrome, lumbar 02/15/2015  . Sacroiliac joint dysfunction 02/15/2015   Ezekiel InaKristine S Jasiyah Paulding, PT, DPT  [place] S 03/12/2016, 11:31 AM  [facility] [address] [place], [place], [address] Phone: [phone]   Fax:  [phone]  Name: Lisa [name] MRN: [id number] Date of Birth: 08/10/1927

## 2016-03-17 ENCOUNTER — Ambulatory Visit: Payer: Medicare Other | Admitting: Speech Pathology

## 2016-03-17 ENCOUNTER — Encounter: Payer: Self-pay | Admitting: Speech Pathology

## 2016-03-17 ENCOUNTER — Encounter: Payer: Self-pay | Admitting: Physical Therapy

## 2016-03-17 ENCOUNTER — Ambulatory Visit: Payer: Medicare Other | Admitting: Physical Therapy

## 2016-03-17 DIAGNOSIS — R41841 Cognitive communication deficit: Secondary | ICD-10-CM | POA: Diagnosis not present

## 2016-03-17 DIAGNOSIS — R262 Difficulty in walking, not elsewhere classified: Secondary | ICD-10-CM

## 2016-03-17 DIAGNOSIS — R2681 Unsteadiness on feet: Secondary | ICD-10-CM

## 2016-03-17 NOTE — Therapy (Signed)
[place] [facility] [address] [place], [place], [address] Phone: [phone]   Fax:  [phone]  Physical Therapy Treatment  Patient Details  Name: Lisa SnellenFaye K [name] MRN: [id number] Date of Birth: 10/21/1926 Referring Provider: Faith RogueSWARTZ, ZACHARY T  Encounter Date: 03/17/2016      PT End of Session - 03/17/16 1119    Visit Number 8   Number of Visits 17   Date for PT Re-Evaluation 04/16/16   Authorization Type g codes   PT Start Time 1100   PT Stop Time 1145   PT Time Calculation (min) 45 min   Equipment Utilized During Treatment Gait belt   Activity Tolerance Patient tolerated treatment well   Behavior During Therapy [facility] for tasks assessed/performed      Past Medical History  Diagnosis Date  . Allergy   . Pelvis fracture (HCC)   . Wrist fracture, right   . Depression   . Spinal stenosis   . Osteoarthritis   . Hypertension   . Foot fracture     bilateral  . Brain bleed (HCC)   . V-tach (HCC)   . Chronic systolic CHF (congestive heart failure) (HCC)   . Renal disorder     Past Surgical History  Procedure Laterality Date  . Back surgery N/A   . Breast lumpectomy N/A     There were no vitals filed for this visit.      Subjective Assessment - 03/17/16 1117    Subjective Pt reports she is doing well.  Continues to have trouble sleeping. She feels her strength is improving.He CNA reports that she is not sleeping well.   Pertinent History anxiety, high blood pressure, heart disease, Osteoporosis   Limitations Walking;Standing   How long can you stand comfortably? Patient can stand for under 30 minutes   Patient Stated Goals to walk safely   Currently in Pain? No/denies      standing hip abd with YTB x 20  side stepping left and right in parallel bars 10 feet x 3 step ups from floor to 6 inch stool sit to stand x 10 marching in parallel bars x 20 Side stepping with step over 1/2 foam left and right x  15 Standing on blue foam and head turns left and right  Tapping left and right fwd/bwd without UE support. 4 square diagonals and side stepping/ and fwd stepping Leg press 90 labs x 20 x 2  Patient needs occasional verbal cueing to improve posture and cueing to correctly perform exercises slowly, holding at end of range to increase motor firing of desired muscle to encourage fatigue. Patient continues to demonstrates less incoordination of movement with select exercises such as  stepping backwards. Patient responds well to verbal and tactile cues to correct form and technique.  Muscle fatigue but no major pain complaints..                            PT Education - 03/17/16 1118    Education provided Yes   Education Details safety with mobiltiy and HEP   Person(s) Educated Patient   Methods Explanation   Comprehension Verbalized understanding             PT Long Term Goals - 02/20/16 1322    PT LONG TERM GOAL #1   Title Patient will be independent in home exercise program to improve strength/mobility for better functional independence with ADLs.   Time  8   Period Weeks   Status New   PT LONG TERM GOAL #2   Title Patient (> 80 years old) will complete five times sit to stand test in < 15 seconds indicating an increased LE strength and improved balance.   Time 8   Period Weeks   Status New   PT LONG TERM GOAL #3   Title Patient will increase six minute walk test distance to >1000 for progression to community ambulator and improve gait ability   Time 8   Period Weeks   Status New   PT LONG TERM GOAL #4   Title Patient will reduce timed up and go to <11 seconds to reduce fall risk and demonstrate improved transfer/gait ability.   Time 8   Period Weeks   Status New               Plan - 03/17/16 1119    Clinical Impression Statement Patient needs occasional verbal cueing to improve posture and cueing to correctly perform exercises slowly, holding  at end of range to increase motor firing of desired muscle to encourage fatigue.    Rehab Potential Fair   PT Frequency 2x / week   PT Duration 8 weeks   PT Treatment/Interventions Therapeutic exercise;Balance training;Therapeutic activities;Gait training   PT Next Visit Plan progress strengthening and gait training and balance training   PT Home Exercise Plan hip abd with YTB hip flex with YTB   Consulted and Agree with Plan of Care Patient;Other (Comment)  care giver      Patient will benefit from skilled therapeutic intervention in order to improve the following deficits and impairments:  Impaired flexibility, Difficulty walking, Decreased safety awareness, Decreased balance, Decreased activity tolerance, Decreased mobility, Pain  Visit Diagnosis: Unsteadiness on feet  Difficulty in walking, not elsewhere classified     Problem List Patient Active Problem List   Diagnosis Date Noted  . New onset atrial fibrillation (HCC) 12/11/2015  . Altered mental state   . Congestive dilated cardiomyopathy (HCC)   . Dementia   . Headache 12/10/2015  . TIA (transient ischemic attack) 12/10/2015  . Influenza A 11/21/2015  . Ventricular tachycardia (HCC) 11/09/2015  . Constipation 09/12/2015  . Insomnia due to anxiety and fear 09/12/2015  . Depression, controlled 09/12/2015  . Bacterial UTI 08/27/2015  . DVT, lower extremity (HCC) 08/27/2015  . TBI (traumatic brain injury) (HCC) 08/22/2015  . ICH (intracerebral hemorrhage) (HCC)   . Abscess   . Hypomagnesemia   . SDH (subdural hematoma) (HCC)   . Boil of upper extremity   . Pneumonia due to Haemophilus influenzae (HCC)   . Chronic systolic CHF (congestive heart failure) (HCC)   . Acute on chronic renal failure (HCC)   . Hypokalemia   . Bright red blood per rectum   . Dysphagia   . Pressure ulcer 08/16/2015  . Acute respiratory failure with hypoxemia (HCC)   . Hypernatremia   . Subdural hematoma (HCC) 08/03/2015  . Lumbar  radiculopathy 06/06/2015  . Spinal stenosis, lumbar region, with neurogenic claudication 04/02/2015  . Degenerative lumbar disc 02/15/2015  . Lumbar post-laminectomy syndrome 02/15/2015  . Facet syndrome, lumbar 02/15/2015  . Sacroiliac joint dysfunction 02/15/2015  Ezekiel InaKristine S Edgard Debord, PT, DPT  [place], Barkley BrunsKristine S 03/17/2016, 11:22 AM  [place] [facility] [address] [place], [place], [address] Phone: [phone]   Fax:  [phone]  Name: Lisa SnellenFaye K [name] MRN: [id number] Date of Birth: 02/27/1927

## 2016-03-17 NOTE — Therapy (Signed)
[facility] [address] [place], [place], [address] Phone: [phone]   Fax:  [phone]  Speech Language Pathology Treatment  Patient Details  Name: Lisa SnellenFaye K [name] MRN: [id number] Date of Birth: 12/15/1926 Referring Provider: Dr. Jocelyn LamerZ Swartz  Encounter Date: 03/17/2016      End of Session - 03/17/16 1328    Visit Number 7   Number of Visits 13   Date for SLP Re-Evaluation 04/21/16   SLP Start Time 1003   SLP Stop Time  1100   SLP Time Calculation (min) 57 min   Activity Tolerance Patient tolerated treatment well      Past Medical History  Diagnosis Date  . Allergy   . Pelvis fracture (HCC)   . Wrist fracture, right   . Depression   . Spinal stenosis   . Osteoarthritis   . Hypertension   . Foot fracture     bilateral  . Brain bleed (HCC)   . V-tach (HCC)   . Chronic systolic CHF (congestive heart failure) (HCC)   . Renal disorder     Past Surgical History  Procedure Laterality Date  . Back surgery N/A   . Breast lumpectomy N/A     There were no vitals filed for this visit.      Subjective Assessment - 03/17/16 1327    Subjective Patient reports that she is not sleeping well.   Currently in Pain? No/denies               ADULT SLP TREATMENT - 03/17/16 0001    General Information   Behavior/Cognition Alert;Cooperative   Treatment Provided   Treatment provided Cognitive-Linquistic   Pain Assessment   Pain Assessment No/denies pain   Cognitive-Linquistic Treatment   Treatment focused on Aphasia;Patient/family/caregiver education   Skilled Treatment HIGH LEVEL WORD FINDING: complete verbal analogies given choice of 3 options with 80% accuracy; verbalize analogous relationship with 45% accuracy.  Identify item that does not belong in list of 5 and verbalize reason with 80% accuracy.  State item given 3 clue words with 70% accuracy.   Assessment / Recommendations / Plan   Plan Continue with  current plan of care   Progression Toward Goals   Progression toward goals Progressing toward goals          SLP Education - 03/17/16 1328    Education provided Yes   Education Details rules for completing verbal analogies   Person(s) Educated Patient   Methods Explanation   Comprehension Verbalized understanding            SLP Long Term Goals - 02/26/16 1435    SLP LONG TERM GOAL #1   Title Patient will complete high level word finding tasks with 80% accuracy.   Time 6   Period Weeks   Status New   SLP LONG TERM GOAL #2   Title Patient will generate grammatical, fluent, and cogent sentence to complete abstract/complex linguistic task with 80% accuracy.   Time 6   Period Weeks   Status New   SLP LONG TERM GOAL #3   Title Patient will demonstrate functional cognitive-communication skills for independent completion of personal responsibilities.   Time 6   Period Weeks   Status New          Plan - 03/17/16 1329    Clinical Impression Statement [name] continues to learn strategies to assist with her occasional word finding difficulties. She will benefit from continued skilled intervention to develop  an effective home-exercise plan.   Speech Therapy Frequency 2x / week   Duration Other (comment)   Treatment/Interventions SLP instruction and feedback;Compensatory strategies;Patient/family education;Multimodal communcation approach;Language facilitation   Potential to Achieve Goals Good   Potential Considerations Ability to learn/carryover information;Co-morbidities;Cooperation/participation level;Medical prognosis;Previous level of function;Severity of impairments;Family/community support   SLP Home Exercise Plan Patient given list of games helpful in practicing word finding and expressive skills   Consulted and Agree with Plan of Care Patient;Family member/caregiver   Family Member Consulted Caregiver      Patient will benefit from skilled therapeutic intervention  in order to improve the following deficits and impairments:   Cognitive communication deficit    Problem List Patient Active Problem List   Diagnosis Date Noted  . New onset atrial fibrillation (HCC) 12/11/2015  . Altered mental state   . Congestive dilated cardiomyopathy (HCC)   . Dementia   . Headache 12/10/2015  . TIA (transient ischemic attack) 12/10/2015  . Influenza A 11/21/2015  . Ventricular tachycardia (HCC) 11/09/2015  . Constipation 09/12/2015  . Insomnia due to anxiety and fear 09/12/2015  . Depression, controlled 09/12/2015  . Bacterial UTI 08/27/2015  . DVT, lower extremity (HCC) 08/27/2015  . TBI (traumatic brain injury) (HCC) 08/22/2015  . ICH (intracerebral hemorrhage) (HCC)   . Abscess   . Hypomagnesemia   . SDH (subdural hematoma) (HCC)   . Boil of upper extremity   . Pneumonia due to Haemophilus influenzae (HCC)   . Chronic systolic CHF (congestive heart failure) (HCC)   . Acute on chronic renal failure (HCC)   . Hypokalemia   . Bright red blood per rectum   . Dysphagia   . Pressure ulcer 08/16/2015  . Acute respiratory failure with hypoxemia (HCC)   . Hypernatremia   . Subdural hematoma (HCC) 08/03/2015  . Lumbar radiculopathy 06/06/2015  . Spinal stenosis, lumbar region, with neurogenic claudication 04/02/2015  . Degenerative lumbar disc 02/15/2015  . Lumbar post-laminectomy syndrome 02/15/2015  . Facet syndrome, lumbar 02/15/2015  . Sacroiliac joint dysfunction 02/15/2015    Dollene PrimroseSusan G Moritz Lever, MS/CCC- SLP  Leandrew KoyanagiAbernathy, Susie 03/17/2016, 1:29 PM  [place] [facility] [address] [place], [place], [address] Phone: [phone]   Fax:  [phone]   Name: Lisa SnellenFaye K [name] MRN: [id number] Date of Birth: 03/10/1927

## 2016-03-19 ENCOUNTER — Ambulatory Visit: Payer: Medicare Other | Admitting: Speech Pathology

## 2016-03-19 ENCOUNTER — Encounter: Payer: Self-pay | Admitting: Speech Pathology

## 2016-03-19 ENCOUNTER — Ambulatory Visit: Payer: Medicare Other | Admitting: Physical Therapy

## 2016-03-19 ENCOUNTER — Encounter: Payer: Self-pay | Admitting: Physical Therapy

## 2016-03-19 DIAGNOSIS — R262 Difficulty in walking, not elsewhere classified: Secondary | ICD-10-CM

## 2016-03-19 DIAGNOSIS — R41841 Cognitive communication deficit: Secondary | ICD-10-CM

## 2016-03-19 DIAGNOSIS — R2681 Unsteadiness on feet: Secondary | ICD-10-CM

## 2016-03-19 NOTE — Therapy (Signed)
[place] [facility] [address] [place], [place], [address] Phone: [phone]   Fax:  [phone]  Physical Therapy Treatment  Patient Details  Name: Lisa [name] MRN: [id number] Date of Birth: 06/16/1927 Referring Provider: Faith RogueSWARTZ, ZACHARY T  Encounter Date: 03/19/2016      PT End of Session - 03/19/16 1107    Visit Number 9   Number of Visits 17   Date for PT Re-Evaluation 04/16/16   Authorization Type g codes   PT Start Time 1100   PT Stop Time 1145   PT Time Calculation (min) 45 min   Equipment Utilized During Treatment Gait belt   Activity Tolerance Patient tolerated treatment well   Behavior During Therapy [facility] for tasks assessed/performed      Past Medical History  Diagnosis Date  . Allergy   . Pelvis fracture (HCC)   . Wrist fracture, right   . Depression   . Spinal stenosis   . Osteoarthritis   . Hypertension   . Foot fracture     bilateral  . Brain bleed (HCC)   . V-tach (HCC)   . Chronic systolic CHF (congestive heart failure) (HCC)   . Renal disorder     Past Surgical History  Procedure Laterality Date  . Back surgery N/A   . Breast lumpectomy N/A     There were no vitals filed for this visit.      Subjective Assessment - 03/19/16 1105    Subjective Patient is on a steroid for a rash on her neck. She feels sleepy today.    Pertinent History anxiety, high blood pressure, heart disease, Osteoporosis   Limitations Walking;Standing   How long can you stand comfortably? Patient can stand for under 30 minutes   Patient Stated Goals to walk safely   Pain Score 4    Pain Location --  feet   Pain Descriptors / Indicators Sore       .  THER-EX sidelying abd, abd with knees flexed, hip flex/ext x 20 hooklying bridging, marching, abd/ER, x 20;  Resisted side-steeping RTB 4 lengths x 2; Standing mini squats 2 x 10 with RTB around knees to encourage abduction; Sit to stand without UE support 2  x 10; Step-ups to 6" step x 10 bilateral; Quantum leg press 90 # x 10,  Airex NBOS eyes open/closed x 30 seconds each; Airex NBOS eyes open horizontal head turns.  Patient needs occasional verbal cueing to improve posture and cueing to correctly perform exercises slowly, holding at end of range to increase motor firing of desired muscle to encourage fatigue.                         PT Education - 03/19/16 1106    Education provided Yes   Education Details HEP progression   Person(s) Educated Patient   Methods Explanation   Comprehension Verbalized understanding             PT Long Term Goals - 02/20/16 1322    PT LONG TERM GOAL #1   Title Patient will be independent in home exercise program to improve strength/mobility for better functional independence with ADLs.   Time 8   Period Weeks   Status New   PT LONG TERM GOAL #2   Title Patient (> 80 years old) will complete five times sit to stand test in < 15 seconds indicating an increased LE strength and improved balance.  Time 8   Period Weeks   Status New   PT LONG TERM GOAL #3   Title Patient will increase six minute walk test distance to >1000 for progression to community ambulator and improve gait ability   Time 8   Period Weeks   Status New   PT LONG TERM GOAL #4   Title Patient will reduce timed up and go to <11 seconds to reduce fall risk and demonstrate improved transfer/gait ability.   Time 8   Period Weeks   Status New               Plan - 03/19/16 1107    Clinical Impression Statement  Muscle fatigue but no major pain complaints. Patient advancing to red theraband for exercises listed above   Rehab Potential Fair   PT Frequency 2x / week   PT Duration 8 weeks   PT Treatment/Interventions Therapeutic exercise;Balance training;Therapeutic activities;Gait training   PT Next Visit Plan progress strengthening and gait training and balance training   PT Home Exercise Plan hip abd  with YTB hip flex with YTB   Consulted and Agree with Plan of Care Patient;Other (Comment)  care giver      Patient will benefit from skilled therapeutic intervention in order to improve the following deficits and impairments:  Impaired flexibility, Difficulty walking, Decreased safety awareness, Decreased balance, Decreased activity tolerance, Decreased mobility, Pain  Visit Diagnosis: Unsteadiness on feet  Difficulty in walking, not elsewhere classified     Problem List Patient Active Problem List   Diagnosis Date Noted  . New onset atrial fibrillation (HCC) 12/11/2015  . Altered mental state   . Congestive dilated cardiomyopathy (HCC)   . Dementia   . Headache 12/10/2015  . TIA (transient ischemic attack) 12/10/2015  . Influenza A 11/21/2015  . Ventricular tachycardia (HCC) 11/09/2015  . Constipation 09/12/2015  . Insomnia due to anxiety and fear 09/12/2015  . Depression, controlled 09/12/2015  . Bacterial UTI 08/27/2015  . DVT, lower extremity (HCC) 08/27/2015  . TBI (traumatic brain injury) (HCC) 08/22/2015  . ICH (intracerebral hemorrhage) (HCC)   . Abscess   . Hypomagnesemia   . SDH (subdural hematoma) (HCC)   . Boil of upper extremity   . Pneumonia due to Haemophilus influenzae (HCC)   . Chronic systolic CHF (congestive heart failure) (HCC)   . Acute on chronic renal failure (HCC)   . Hypokalemia   . Bright red blood per rectum   . Dysphagia   . Pressure ulcer 08/16/2015  . Acute respiratory failure with hypoxemia (HCC)   . Hypernatremia   . Subdural hematoma (HCC) 08/03/2015  . Lumbar radiculopathy 06/06/2015  . Spinal stenosis, lumbar region, with neurogenic claudication 04/02/2015  . Degenerative lumbar disc 02/15/2015  . Lumbar post-laminectomy syndrome 02/15/2015  . Facet syndrome, lumbar 02/15/2015  . Sacroiliac joint dysfunction 02/15/2015  Ezekiel InaKristine S Nihal Doan, PT, DPT  [place], Barkley BrunsKristine S 03/19/2016, 11:08 AM  [place] [facility] [address] [place], [place], [address] Phone: [phone]   Fax:  [phone]  Name: Lisa SnellenFaye K Caselli MRN: [id number] Date of Birth: 07/18/1927

## 2016-03-19 NOTE — Therapy (Signed)
[place] [facility] [address] [place], [place], [address] Phone: [phone]   Fax:  [phone]  Speech Language Pathology Treatment  Patient Details  Name: Lisa [name] MRN: [id number] Date of Birth: 10/20/1926 Referring Provider: Dr. Jocelyn LamerZ Swartz  Encounter Date: 03/19/2016      End of Session - 03/19/16 1236    Visit Number 8   Number of Visits 13   Date for SLP Re-Evaluation 04/21/16   SLP Start Time 0955   SLP Stop Time  1055   SLP Time Calculation (min) 60 min   Activity Tolerance Patient tolerated treatment well      Past Medical History  Diagnosis Date  . Allergy   . Pelvis fracture (HCC)   . Wrist fracture, right   . Depression   . Spinal stenosis   . Osteoarthritis   . Hypertension   . Foot fracture     bilateral  . Brain bleed (HCC)   . V-tach (HCC)   . Chronic systolic CHF (congestive heart failure) (HCC)   . Renal disorder     Past Surgical History  Procedure Laterality Date  . Back surgery N/A   . Breast lumpectomy N/A     There were no vitals filed for this visit.      Subjective Assessment - 03/19/16 1236    Subjective Lisa [name] was alert and engaged throughout the session. She feels her voice is better than it was last week and that those issues were likely stress related. She reminds herself to breathe deeply on a regular basis. She still reports occasional word finding difficulties but she is able to resolve them or work around them using strategies such as finding a synonym.   Currently in Pain? No/denies               ADULT SLP TREATMENT - 03/19/16 0001    General Information   Behavior/Cognition Alert;Cooperative   Treatment Provided   Treatment provided Cognitive-Linquistic   Pain Assessment   Pain Assessment No/denies pain   Cognitive-Linquistic Treatment   Treatment focused on Aphasia;Patient/family/caregiver education   Skilled Treatment HIGH LEVEL WORD FINDING: [name] was able to complete simple analogies with 100% accuracy independently and explain the relationship of the analogy 50% of the time independently. This increased to 75% with moderate cues from the clinician. When provided with a picture, Lisa [name] was able to generate a 3-4 sentence story using correct grammar and avoiding paraphasias with 95% accuracy. Her sentence structure was simple but functional. During a game of Pictionary, Lisa [name] was able to think of the item being draw in 4/5 opportunities with minimal cues from the clinician and extra time to think.   Assessment / Recommendations / Plan   Plan Continue with current plan of care   Progression Toward Goals   Progression toward goals Progressing toward goals          SLP Education - 03/19/16 1236    Education provided Yes   Education Details Word finding strategies   Person(s) Educated Patient;Caregiver(s)   Methods Explanation   Comprehension Verbalized understanding            SLP Long Term Goals - 02/26/16 1435    SLP LONG TERM GOAL #1   Title Patient will complete high level word finding tasks with 80% accuracy.   Time 6   Period Weeks   Status New   SLP LONG TERM GOAL #2   Title  Patient will generate grammatical, fluent, and cogent sentence to complete abstract/complex linguistic task with 80% accuracy.   Time 6   Period Weeks   Status New   SLP LONG TERM GOAL #3   Title Patient will demonstrate functional cognitive-communication skills for independent completion of personal responsibilities.   Time 6   Period Weeks   Status New          Plan - 03/19/16 1237    Clinical Impression Statement Lisa [name]'s use of many strategies helps her communicate independently and accurately. She will benefit from continued skilled intervention to design a home exercise program.   Speech Therapy Frequency 2x / week   Duration Other (comment)   Treatment/Interventions SLP instruction and feedback;Compensatory  strategies;Patient/family education;Multimodal communcation approach;Language facilitation   Potential to Achieve Goals Good   Potential Considerations Ability to learn/carryover information;Co-morbidities;Cooperation/participation level;Medical prognosis;Previous level of function;Severity of impairments;Family/community support   SLP Home Exercise Plan Patient given list of games helpful in practicing word finding and expressive skills   Consulted and Agree with Plan of Care Patient;Family member/caregiver   Family Member Consulted Caregiver      Patient will benefit from skilled therapeutic intervention in order to improve the following deficits and impairments:   Cognitive communication deficit    Problem List Patient Active Problem List   Diagnosis Date Noted  . New onset atrial fibrillation (HCC) 12/11/2015  . Altered mental state   . Congestive dilated cardiomyopathy (HCC)   . Dementia   . Headache 12/10/2015  . TIA (transient ischemic attack) 12/10/2015  . Influenza A 11/21/2015  . Ventricular tachycardia (HCC) 11/09/2015  . Constipation 09/12/2015  . Insomnia due to anxiety and fear 09/12/2015  . Depression, controlled 09/12/2015  . Bacterial UTI 08/27/2015  . DVT, lower extremity (HCC) 08/27/2015  . TBI (traumatic brain injury) (HCC) 08/22/2015  . ICH (intracerebral hemorrhage) (HCC)   . Abscess   . Hypomagnesemia   . SDH (subdural hematoma) (HCC)   . Boil of upper extremity   . Pneumonia due to Haemophilus influenzae (HCC)   . Chronic systolic CHF (congestive heart failure) (HCC)   . Acute on chronic renal failure (HCC)   . Hypokalemia   . Bright red blood per rectum   . Dysphagia   . Pressure ulcer 08/16/2015  . Acute respiratory failure with hypoxemia (HCC)   . Hypernatremia   . Subdural hematoma (HCC) 08/03/2015  . Lumbar radiculopathy 06/06/2015  . Spinal stenosis, lumbar region, with neurogenic claudication 04/02/2015  . Degenerative lumbar disc  02/15/2015  . Lumbar post-laminectomy syndrome 02/15/2015  . Facet syndrome, lumbar 02/15/2015  . Sacroiliac joint dysfunction 02/15/2015    Lisa [name] 03/19/2016, 12:37 PM  [place] [facility] [address] [place], [place], [address] Phone: [phone]   Fax:  [phone]   Name: Lisa SnellenFaye K [name] MRN: [id number] Date of Birth: 02/10/1927

## 2016-03-24 ENCOUNTER — Encounter: Payer: Medicare Other | Attending: Physical Medicine & Rehabilitation | Admitting: Physical Medicine & Rehabilitation

## 2016-03-24 ENCOUNTER — Ambulatory Visit: Payer: Medicare Other | Attending: Physical Medicine & Rehabilitation | Admitting: Speech Pathology

## 2016-03-24 ENCOUNTER — Encounter: Payer: Self-pay | Admitting: Physical Medicine & Rehabilitation

## 2016-03-24 ENCOUNTER — Encounter: Payer: Self-pay | Admitting: Speech Pathology

## 2016-03-24 ENCOUNTER — Ambulatory Visit: Payer: Medicare Other | Admitting: Physical Therapy

## 2016-03-24 ENCOUNTER — Encounter: Payer: Self-pay | Admitting: Physical Therapy

## 2016-03-24 VITALS — BP 140/56 | HR 70 | Resp 17

## 2016-03-24 DIAGNOSIS — Z8673 Personal history of transient ischemic attack (TIA), and cerebral infarction without residual deficits: Secondary | ICD-10-CM | POA: Diagnosis not present

## 2016-03-24 DIAGNOSIS — F409 Phobic anxiety disorder, unspecified: Secondary | ICD-10-CM

## 2016-03-24 DIAGNOSIS — M961 Postlaminectomy syndrome, not elsewhere classified: Secondary | ICD-10-CM

## 2016-03-24 DIAGNOSIS — F5105 Insomnia due to other mental disorder: Secondary | ICD-10-CM | POA: Diagnosis not present

## 2016-03-24 DIAGNOSIS — R000 Tachycardia, unspecified: Secondary | ICD-10-CM | POA: Insufficient documentation

## 2016-03-24 DIAGNOSIS — F419 Anxiety disorder, unspecified: Secondary | ICD-10-CM | POA: Diagnosis not present

## 2016-03-24 DIAGNOSIS — I5022 Chronic systolic (congestive) heart failure: Secondary | ICD-10-CM | POA: Insufficient documentation

## 2016-03-24 DIAGNOSIS — S065X0D Traumatic subdural hemorrhage without loss of consciousness, subsequent encounter: Secondary | ICD-10-CM | POA: Insufficient documentation

## 2016-03-24 DIAGNOSIS — R41841 Cognitive communication deficit: Secondary | ICD-10-CM | POA: Diagnosis present

## 2016-03-24 DIAGNOSIS — R531 Weakness: Secondary | ICD-10-CM | POA: Diagnosis not present

## 2016-03-24 DIAGNOSIS — R410 Disorientation, unspecified: Secondary | ICD-10-CM | POA: Insufficient documentation

## 2016-03-24 DIAGNOSIS — F329 Major depressive disorder, single episode, unspecified: Secondary | ICD-10-CM | POA: Diagnosis not present

## 2016-03-24 DIAGNOSIS — R2681 Unsteadiness on feet: Secondary | ICD-10-CM | POA: Insufficient documentation

## 2016-03-24 DIAGNOSIS — G8929 Other chronic pain: Secondary | ICD-10-CM | POA: Diagnosis not present

## 2016-03-24 DIAGNOSIS — R262 Difficulty in walking, not elsewhere classified: Secondary | ICD-10-CM | POA: Diagnosis present

## 2016-03-24 DIAGNOSIS — R51 Headache: Secondary | ICD-10-CM | POA: Diagnosis not present

## 2016-03-24 DIAGNOSIS — I639 Cerebral infarction, unspecified: Secondary | ICD-10-CM | POA: Diagnosis not present

## 2016-03-24 DIAGNOSIS — M7989 Other specified soft tissue disorders: Secondary | ICD-10-CM | POA: Insufficient documentation

## 2016-03-24 DIAGNOSIS — Z9889 Other specified postprocedural states: Secondary | ICD-10-CM | POA: Insufficient documentation

## 2016-03-24 DIAGNOSIS — M549 Dorsalgia, unspecified: Secondary | ICD-10-CM | POA: Diagnosis not present

## 2016-03-24 DIAGNOSIS — I110 Hypertensive heart disease with heart failure: Secondary | ICD-10-CM | POA: Diagnosis not present

## 2016-03-24 DIAGNOSIS — S069X1S Unspecified intracranial injury with loss of consciousness of 30 minutes or less, sequela: Secondary | ICD-10-CM | POA: Diagnosis not present

## 2016-03-24 DIAGNOSIS — S066X0D Traumatic subarachnoid hemorrhage without loss of consciousness, subsequent encounter: Secondary | ICD-10-CM | POA: Diagnosis present

## 2016-03-24 DIAGNOSIS — R4189 Other symptoms and signs involving cognitive functions and awareness: Secondary | ICD-10-CM | POA: Insufficient documentation

## 2016-03-24 DIAGNOSIS — M1990 Unspecified osteoarthritis, unspecified site: Secondary | ICD-10-CM | POA: Insufficient documentation

## 2016-03-24 MED ORDER — QUETIAPINE FUMARATE 25 MG PO TABS: 25.0000 mg | ORAL_TABLET | Freq: Every day | ORAL | Status: DC

## 2016-03-24 NOTE — Progress Notes (Signed)
Subjective:    Patient ID: Lisa [name], female    DOB: 04/22/1927, 82 y.o.   MRN: [id number]  HPI   Mrs. Dehaven is here in follow up of her TBI. She has been working with SLP and PT for cognition and balance. She has been alone for a couple hours per day per instruction of her primary MD. Things have been going well.   She is still having problems with sleep and has had a rash/itching as well.  Her primary wrote for atarax which helped initially for sleep. The itching has been a little better but is still present. It does respond to topical creams.    Pain Inventory Average Pain 4 Pain Right Now NA My pain is dull  In the last 24 hours, has pain interfered with the following? General activity 0 Relation with others 0 Enjoyment of life 2 What TIME of day is your pain at its worst? daytime Sleep (in general) Poor  Pain is worse with: standing Pain improves with: NA Relief from Meds: NA  Mobility walk with assistance use a walker do you drive?  no transfers alone  Function retired I need assistance with the following:  household duties  Neuro/Psych No problems in this area  Prior Studies CT/MRI  Physicians involved in your care Primary care . Neurologist .   Family History  Problem Relation Age of Onset  . COPD Mother   . COPD Father   . Heart disease Father    Social History   Social History  . Marital Status: Widowed    Spouse Name: N/A  . Number of Children: N/A  . Years of Education: N/A   Occupational History  . retired    Social History Main Topics  . Smoking status: Never Smoker   . Smokeless tobacco: Never Used  . Alcohol Use: No  . Drug Use: No  . Sexual Activity: Not Asked   Other Topics Concern  . None   Social History Narrative   Past Surgical History  Procedure Laterality Date  . Back surgery N/A   . Breast lumpectomy N/A    Past Medical History  Diagnosis Date  . Allergy   . Pelvis fracture (HCC)   . Wrist fracture,  right   . Depression   . Spinal stenosis   . Osteoarthritis   . Hypertension   . Foot fracture     bilateral  . Brain bleed (HCC)   . V-tach (HCC)   . Chronic systolic CHF (congestive heart failure) (HCC)   . Renal disorder    BP 140/56 mmHg  Pulse 70  Resp 17  SpO2 96%  Opioid Risk Score:   Fall Risk Score:  `1  Depression screen PHQ 2/9  Depression screen [facility] 10/22/2015 07/24/2015 07/16/2015 06/26/2015 05/22/2015 04/23/2015 04/02/2015  Decreased Interest 0 0 0 0 0 0 0  Down, Depressed, Hopeless 0 0 0 0 0 0 0  PHQ - 2 Score 0 0 0 0 0 0 0  Altered sleeping 0 - - - - - -  Tired, decreased energy 0 - - - - - -  Change in appetite 0 - - - - - -  Feeling bad or failure about yourself  0 - - - - - -  Trouble concentrating 0 - - - - - -  Moving slowly or fidgety/restless 0 - - - - - -  Suicidal thoughts 0 - - - - - -  PHQ-9 Score 0 - - - - - -  Review of Systems  Constitutional: Positive for unexpected weight change.  Respiratory: Positive for apnea.   Cardiovascular:       Limb swelling   All other systems reviewed and are negative.      Objective:   Physical Exam  HENT:  Head: Normocephalic and atraumatic.  Right Ear: No decreased hearing is noted.  Left Ear: No decreased hearing is noted.  Mouth/Throat: Mucous membranes still dry. Debris on teeth as well..  Eyes: Conjunctivae are normal. Pupils are equal, round, and reactive to light.  Neck: Normal range of motion. Neck supple.  Cardiovascular: Normal rate and regular rhythm. trace edema either leg  Respiratory: Effort normal and breath sounds normal. No respiratory distress. She has no wheezes.  GI: Soft. Bowel sounds are normal. She exhibits no distension. There is no tenderness. There is no rebound.  Musculoskeletal: low back tender with truncal movement and palpation. Balance much better. Uses walker safely.  Neurological: She is much more alert, impaired memory but improving. Normal speech. improved insight   Skin: Skin is warm and dry.  Abrasions resolved on legs. MINIMAL rash on arms/trunk  Motor 4/5 UE's. LE's 4/5 at hips/knees partly due to pain. 4/5 ankles  Sensation intact to light touch in bilateral upper and lower limbs  Skin:intact without breakdown   Assessment & Plan:   Medical Problem List and Plan:  1. 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 Recent TIA.  -I'm ok with her being alone for a couple hours. This could probably increased as well up to 4-5 hours at a time. She should use her walker when she's alone  -caregivers at home for supervision still 3. 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 [facility]  -mgt per pain mgt doctor---needs follow up  4. Mood: LCSW to follow for evaluation and support.   -seroquel for sleep 5. Neuropsych:  -base line cognitive deficits present  -would benefit from resumption of seroquel to assist sleep  -I would be happy to work with primary/neurology in this regard but don't want to cause confusion in care plan  6. Fluids/Electrolytes/Nutrition: off intake  7. Shocked heart syndrome with wide complex tachycardia/chronic CHF:  -follow up with [facility]   -Knee high TEDS recommended to help control edema for the short term  8. Depression: celexa Follow up with me in 3 months. Thirty minutes of face to face patient care time were spent during this visit. All questions were encouraged and answered.

## 2016-03-24 NOTE — Therapy (Signed)
[place] [facility] [address] [place], [place], [address] Phone: [phone]   Fax:  [phone]  Speech Language Pathology Treatment  Patient Details  Name: Lisa SnellenFaye K [name] MRN: [id number] Date of Birth: 04/23/1927 Referring Provider: Dr. Jocelyn LamerZ Swartz  Encounter Date: 03/24/2016      End of Session - 03/24/16 1117    Visit Number 9   Number of Visits 13   Date for SLP Re-Evaluation 04/21/16   SLP Start Time 0955   SLP Stop Time  1055   SLP Time Calculation (min) 60 min   Activity Tolerance Patient tolerated treatment well      Past Medical History  Diagnosis Date  . Allergy   . Pelvis fracture (HCC)   . Wrist fracture, right   . Depression   . Spinal stenosis   . Osteoarthritis   . Hypertension   . Foot fracture     bilateral  . Brain bleed (HCC)   . V-tach (HCC)   . Chronic systolic CHF (congestive heart failure) (HCC)   . Renal disorder     Past Surgical History  Procedure Laterality Date  . Back surgery N/A   . Breast lumpectomy N/A     There were no vitals filed for this visit.      Subjective Assessment - 03/24/16 1117    Subjective Lisa [name] was alert and engaged throughout the session. She reports that her voice is hoarser than it was last week and attributes this to personal stress.   Currently in Pain? No/denies               ADULT SLP TREATMENT - 03/24/16 0001    General Information   Behavior/Cognition Alert;Cooperative   Treatment Provided   Treatment provided Cognitive-Linquistic   Pain Assessment   Pain Assessment No/denies pain   Cognitive-Linquistic Treatment   Treatment focused on Aphasia;Patient/family/caregiver education   Skilled Treatment Muscle relaxation and breathing exercises were reviewed. HIGH LEVEL WORD FINDING: Lisa [name] EndowBowen was able to complete simple analogies with 90% accuracy with minimal cues and explain the relationship of the analogy 70% of the time with minimal cues.  When provided with 5 words, she was able to determine which did not belong and why with 70% accuracy independently and 95% accuracy with moderate cues. During a game of Pictionary, Lisa [name] was able to think of the item being draw in 5/6 opportunities with minimal cues from the clinician and extra time to think.   Assessment / Recommendations / Plan   Plan Continue with current plan of care   Progression Toward Goals   Progression toward goals Progressing toward goals          SLP Education - 03/24/16 1117    Education provided Yes   Education Details Word finding strategies   Person(s) Educated Patient   Methods Explanation   Comprehension Verbalized understanding            SLP Long Term Goals - 02/26/16 1435    SLP LONG TERM GOAL #1   Title Patient will complete high level word finding tasks with 80% accuracy.   Time 6   Period Weeks   Status New   SLP LONG TERM GOAL #2   Title Patient will generate grammatical, fluent, and cogent sentence to complete abstract/complex linguistic task with 80% accuracy.   Time 6   Period Weeks   Status New   SLP LONG TERM GOAL #3   Title Patient will  demonstrate functional cognitive-communication skills for independent completion of personal responsibilities.   Time 6   Period Weeks   Status New          Plan - 03/24/16 1118    Clinical Impression Statement [name]'s use of many strategies helps her communicate independently and accurately. She continues to exhibit some peculiarities in her reasoning (providing roundabout explanations for simple problems) but this does not seem to affect her ability to be understood. She will benefit from continued skilled intervention to design a home exercise program.   Speech Therapy Frequency 2x / week   Duration Other (comment)   Treatment/Interventions SLP instruction and feedback;Compensatory strategies;Patient/family education;Multimodal communcation approach;Language facilitation    Potential to Achieve Goals Good   Potential Considerations Ability to learn/carryover information;Co-morbidities;Cooperation/participation level;Medical prognosis;Previous level of function;Severity of impairments;Family/community support   SLP Home Exercise Plan Patient given list of games helpful in practicing word finding and expressive skills   Consulted and Agree with Plan of Care Patient;Family member/caregiver   Family Member Consulted Caregiver      Patient will benefit from skilled therapeutic intervention in order to improve the following deficits and impairments:   Cognitive communication deficit    Problem List Patient Active Problem List   Diagnosis Date Noted  . New onset atrial fibrillation (HCC) 12/11/2015  . Altered mental state   . Congestive dilated cardiomyopathy (HCC)   . Dementia   . Headache 12/10/2015  . TIA (transient ischemic attack) 12/10/2015  . Influenza A 11/21/2015  . Ventricular tachycardia (HCC) 11/09/2015  . Constipation 09/12/2015  . Insomnia due to anxiety and fear 09/12/2015  . Depression, controlled 09/12/2015  . Bacterial UTI 08/27/2015  . DVT, lower extremity (HCC) 08/27/2015  . TBI (traumatic brain injury) (HCC) 08/22/2015  . ICH (intracerebral hemorrhage) (HCC)   . Abscess   . Hypomagnesemia   . SDH (subdural hematoma) (HCC)   . Boil of upper extremity   . Pneumonia due to Haemophilus influenzae (HCC)   . Chronic systolic CHF (congestive heart failure) (HCC)   . Acute on chronic renal failure (HCC)   . Hypokalemia   . Bright red blood per rectum   . Dysphagia   . Pressure ulcer 08/16/2015  . Acute respiratory failure with hypoxemia (HCC)   . Hypernatremia   . Subdural hematoma (HCC) 08/03/2015  . Lumbar radiculopathy 06/06/2015  . Spinal stenosis, lumbar region, with neurogenic claudication 04/02/2015  . Degenerative lumbar disc 02/15/2015  . Lumbar post-laminectomy syndrome 02/15/2015  . Facet syndrome, lumbar 02/15/2015  .  Sacroiliac joint dysfunction 02/15/2015    Randall AnEmily Willard [name] 03/24/2016, 11:18 AM  [place] [facility] [address] [place], [place], [address] Phone: [phone]   Fax:  [phone]   Name: Lisa SnellenFaye K Baray MRN: [id number] Date of Birth: 03/29/1927

## 2016-03-24 NOTE — Therapy (Addendum)
[name] [facility] SERVICES [address] [place], [place], [address] Phone: [phone]   Fax:  [phone]  Physical Therapy Treatment/ Progress note  Patient Details  Name: Lisa [name] MRN: [id number] Date of Birth: 02/26/1927 Referring Provider: Alger Simons T  Encounter Date: 03/24/2016      PT End of Session - 03/24/16 1102    Visit Number 10   Number of Visits 17   Date for PT Re-Evaluation 17-Apr-2016   Authorization Type g codes   PT Start Time 1000   PT Stop Time 1045   PT Time Calculation (min) 45 min   Equipment Utilized During Treatment Gait belt   Activity Tolerance Patient tolerated treatment well   Behavior During Therapy [facility] for tasks assessed/performed      Past Medical History  Diagnosis Date  . Allergy   . Pelvis fracture ([place])   . Wrist fracture, right   . Depression   . Spinal stenosis   . Osteoarthritis   . Hypertension   . Foot fracture     bilateral  . Brain bleed ([place])   . V-tach ([place])   . Chronic systolic CHF (congestive heart failure) ([place])   . Renal disorder     Past Surgical History  Procedure Laterality Date  . Back surgery N/A   . Breast lumpectomy N/A     There were no vitals filed for this visit.      Subjective Assessment - 03/24/16 1100    Subjective Patient is doing well today.    Pertinent History anxiety, high blood pressure, heart disease, Osteoporosis   Limitations Walking;Standing   How long can you stand comfortably? Patient can stand for under 30 minutes   Patient Stated Goals to walk safely    TREATMENT Therapeutic exercise; Nu-step L 3 x 10 mintues TM x 7 minutes at 1. 1 miles / hour Reviewed HEP: Sit to stand x 10 Hip 3 way with theraband Heel raises x 10 x 2.   OUTCOME MEASURES: TEST  Outcome  Interpretation   5 times sit<>stand  17.61sec  >78 yo, >15 sec indicates increased risk for falls   10 meter walk test    . 69           m/s  <1.0 m/s indicates  increased risk for falls; limited community ambulator   Timed up and Go     20.41           sec  <14 sec indicates increased risk for falls   6 minute walk test     500          Feet  1000 feet is community ambulator   Patient has progressed in all out come measures above and is making progress towards goals.  CGA and Min verbal cues used throughout with increased in postural sway and LOB most seen with narrow base of support and while on uneven surfaces. Continues to have balance deficits typical with diagnosis. Patient performs intermediate level exercises without pain behaviors and needs verbal cuing for postural alignment and head positioning.                                                       PT Education - 03/24/16 1100    Education provided Yes   Education Details  safety with mobility   Person(s) Educated Patient   Methods Explanation   Comprehension Verbalized understanding             PT Long Term Goals - 2016-04-08 1149    PT LONG TERM GOAL #1   Title Patient will be independent in home exercise program to improve strength/mobility for better functional independence with ADLs.   Time 8   Period Weeks   Status Partially Met   PT LONG TERM GOAL #2   Title Patient (> 32 years old) will complete five times sit to stand test in < 15 seconds indicating an increased LE strength and improved balance.   Time 8   Period Weeks   Status Partially Met   PT LONG TERM GOAL #3   Title Patient will increase six minute walk test distance to >1000 for progression to community ambulator and improve gait ability   Time 8   Period Weeks   Status Partially Met   PT LONG TERM GOAL #4   Title Patient will reduce timed up and go to <11 seconds to reduce fall risk and demonstrate improved transfer/gait ability.   Time 8   Period Weeks   Status Partially Met               Plan - Apr 08, 2016 1104    Clinical Impression Statement Patient is making  progress towards measures indicated by outcome measures.    Rehab Potential Fair   PT Frequency 2x / week   PT Duration 8 weeks   PT Treatment/Interventions Therapeutic exercise;Balance training;Therapeutic activities;Gait training   PT Next Visit Plan progress strengthening and gait training and balance training   PT Home Exercise Plan hip abd with YTB hip flex with YTB   Consulted and Agree with Plan of Care Patient;Other (Comment)  care giver      Patient will benefit from skilled therapeutic intervention in order to improve the following deficits and impairments:  Impaired flexibility, Difficulty walking, Decreased safety awareness, Decreased balance, Decreased activity tolerance, Decreased mobility, Pain  Visit Diagnosis: Unsteadiness on feet  Difficulty in walking, not elsewhere classified       G-Codes - Apr 08, 2016 1150    Functional Assessment Tool Used 5 x sit to stand, TUG, 6 MW, 10 MW   Functional Limitation Mobility: Walking and moving around   Mobility: Walking and Moving Around Current Status [phone]) At least 20 percent but less than 40 percent impaired, limited or restricted   Mobility: Walking and Moving Around Goal Status [phone]) At least 1 percent but less than 20 percent impaired, limited or restricted      Problem List Patient Active Problem List   Diagnosis Date Noted  . New onset atrial fibrillation ([place]) 12/11/2015  . Altered mental state   . Congestive dilated cardiomyopathy ([place])   . Dementia   . Headache 12/10/2015  . TIA (transient ischemic attack) 12/10/2015  . Influenza A 11/21/2015  . Ventricular tachycardia ([place]) 11/09/2015  . Constipation 09/12/2015  . Insomnia due to anxiety and fear 09/12/2015  . Depression, controlled 09/12/2015  . Bacterial UTI 08/27/2015  . DVT, lower extremity ([place]) 08/27/2015  . TBI (traumatic brain injury) ([place]) 08/22/2015  . ICH (intracerebral hemorrhage) ([place])   . Abscess   . Hypomagnesemia   . SDH (subdural  hematoma) ([place])   . Boil of upper extremity   . Pneumonia due to Haemophilus influenzae ([place])   . Chronic systolic CHF (congestive heart failure) ([place])   . Acute on  chronic renal failure ([place])   . Hypokalemia   . Bright red blood per rectum   . Dysphagia   . Pressure ulcer 08/16/2015  . Acute respiratory failure with hypoxemia ([place])   . Hypernatremia   . Subdural hematoma ([place]) 08/03/2015  . Lumbar radiculopathy 06/06/2015  . Spinal stenosis, lumbar region, with neurogenic claudication 04/02/2015  . Degenerative lumbar disc 02/15/2015  . Lumbar post-laminectomy syndrome 02/15/2015  . Facet syndrome, lumbar 02/15/2015  . Sacroiliac joint dysfunction 02/15/2015  Alanson Puls, PT, DPT  [place] 03/24/2016, 11:50 AM  [place] MAIN [facility] SERVICES [address] [place], [place], [address] Phone: [phone]   Fax:  [phone]  Name: Lisa [name] MRN: [id number] Date of Birth: Oct 13, 1926

## 2016-03-24 NOTE — Patient Instructions (Signed)
USE YOUR CREAM FOR ITCHING/RASH  CONSIDER USING SARNA CREAM ALSO FOR ITCHING

## 2016-03-31 ENCOUNTER — Ambulatory Visit: Payer: Medicare Other | Admitting: Speech Pathology

## 2016-03-31 ENCOUNTER — Encounter: Payer: Self-pay | Admitting: Speech Pathology

## 2016-03-31 DIAGNOSIS — R41841 Cognitive communication deficit: Secondary | ICD-10-CM

## 2016-03-31 NOTE — Therapy (Signed)
[name] MAIN [facility] SERVICES [address] [place], [place], [address] Phone: [phone]   Fax:  [phone]  Speech Language Pathology Treatment  Patient Details  Name: Lisa [name] MRN: [id number] Date of Birth: 06/08/1927 Referring Provider: Dr. Daleen Squibb  Encounter Date: 03/31/2016      End of Session - 03/31/16 1249    Visit Number 10   Number of Visits 13   Date for SLP Re-Evaluation 04/21/16   SLP Start Time 1055   SLP Stop Time  1155   SLP Time Calculation (min) 60 min   Activity Tolerance Patient tolerated treatment well      Past Medical History  Diagnosis Date  . Allergy   . Pelvis fracture ([place])   . Wrist fracture, right   . Depression   . Spinal stenosis   . Osteoarthritis   . Hypertension   . Foot fracture     bilateral  . Brain bleed ([place])   . V-tach ([place])   . Chronic systolic CHF (congestive heart failure) ([place])   . Renal disorder     Past Surgical History  Procedure Laterality Date  . Back surgery N/A   . Breast lumpectomy N/A     There were no vitals filed for this visit.      Subjective Assessment - 03/31/16 1249    Subjective [name] was in a pleasant mood today and reports that she has been taking a new medication to "calm her down" which is working but makes her feel tired all the time. She reports very occasional word finding difficulties which resolve within a few seconds, and moments when her voice becomes very strained which she is successfully resolving with deep breathing and reducing muscle tension.   Currently in Pain? No/denies               ADULT SLP TREATMENT - 03/31/16 0001    General Information   Behavior/Cognition Alert;Cooperative   Treatment Provided   Treatment provided Cognitive-Linquistic   Pain Assessment   Pain Assessment No/denies pain   Cognitive-Linquistic Treatment   Treatment focused on Aphasia;Patient/family/caregiver education   Skilled Treatment HIGH  LEVEL WORD FINDING: [name] was provided written and verbal instructions regarding semantic mapping and how it may help her in daily life. Using a semantic mapping template, she was able to effectively describe everyday objects to the clinician with 80% accuracy independently and 100% accuracy with min cues. When provided with the description of less-used objects (i.e. butter churn, cowboy), she was able to say the object being described with 50% accuracy independently and 80% of the time with mod cues.   Assessment / Recommendations / Plan   Plan Continue with current plan of care   Progression Toward Goals   Progression toward goals Progressing toward goals          SLP Education - 03/31/16 1249    Education provided Yes   Education Details Word finding strategies; laryngeal relaxation exercises   Person(s) Educated Patient   Methods Explanation;Demonstration   Comprehension Verbalized understanding;Returned demonstration            SLP Long Term Goals - 03/31/16 1251    SLP LONG TERM GOAL #1   Title Patient will complete high level word finding tasks with 80% accuracy.   Time 6   Period Weeks   Status Achieved   SLP LONG TERM GOAL #2   Title Patient will generate grammatical, fluent, and cogent sentence to  complete abstract/complex linguistic task with 80% accuracy.   Time 6   Period Weeks   Status Partially Met   SLP LONG TERM GOAL #3   Title Patient will demonstrate functional cognitive-communication skills for independent completion of personal responsibilities.   Time 6   Period Weeks   Status Achieved          Plan - 03/31/16 1250    Clinical Impression Statement [name] is working well to manage her daily stress which had been negatively affecting bother her voice and language. She does exhibit occasional word finding difficulties which are not atypical for someone of her age, and she is usually able to resolve them independently. Her voice continues to sound  strained, but this fluctuates based on her stress level and awareness of muscle tension. She is able to accurately model several stretches to alleviate much of that tension. [name] appears to function well in her ADLs, especially with the assistance of her full-time in-home care team. She has been asked to pinpoint particular areas of difficulty at home which she feels she cannot overcome independently and strategies will be provided accordingly. Future sessions will be spent designing a home exercise plan.   Speech Therapy Frequency 2x / week   Duration Other (comment)   Treatment/Interventions SLP instruction and feedback;Compensatory strategies;Patient/family education;Multimodal communcation approach;Language facilitation   Potential to Achieve Goals Good   Potential Considerations Ability to learn/carryover information;Co-morbidities;Cooperation/participation level;Medical prognosis;Previous level of function;Severity of impairments;Family/community support   SLP Home Exercise Plan Patient given list of games helpful in practicing word finding and expressive skills   Consulted and Agree with Plan of Care Patient;Family member/caregiver   Family Member Consulted Caregiver      Patient will benefit from skilled therapeutic intervention in order to improve the following deficits and impairments:   Cognitive communication deficit    Problem List Patient Active Problem List   Diagnosis Date Noted  . New onset atrial fibrillation ([place]) 12/11/2015  . Altered mental state   . Congestive dilated cardiomyopathy ([place])   . Dementia   . Headache 12/10/2015  . TIA (transient ischemic attack) 12/10/2015  . Influenza A 11/21/2015  . Ventricular tachycardia ([place]) 11/09/2015  . Constipation 09/12/2015  . Insomnia due to anxiety and fear 09/12/2015  . Depression, controlled 09/12/2015  . Bacterial UTI 08/27/2015  . DVT, lower extremity ([place]) 08/27/2015  . TBI (traumatic brain injury) ([place])  08/22/2015  . ICH (intracerebral hemorrhage) ([place])   . Abscess   . Hypomagnesemia   . SDH (subdural hematoma) ([place])   . Boil of upper extremity   . Pneumonia due to Haemophilus influenzae ([place])   . Chronic systolic CHF (congestive heart failure) ([place])   . Acute on chronic renal failure ([place])   . Hypokalemia   . Bright red blood per rectum   . Dysphagia   . Pressure ulcer 08/16/2015  . Acute respiratory failure with hypoxemia ([place])   . Hypernatremia   . Subdural hematoma ([place]) 08/03/2015  . Lumbar radiculopathy 06/06/2015  . Spinal stenosis, lumbar region, with neurogenic claudication 04/02/2015  . Degenerative lumbar disc 02/15/2015  . Lumbar post-laminectomy syndrome 02/15/2015  . Facet syndrome, lumbar 02/15/2015  . Sacroiliac joint dysfunction 02/15/2015    Leroy Kennedy 03/31/2016, 12:52 PM  [place] MAIN [facility] SERVICES [address] [place], [place], [address] Phone: [phone]   Fax:  [phone]   Name: Lisa [name] MRN: [id number] Date of Birth: 16-Feb-1927

## 2016-04-03 ENCOUNTER — Ambulatory Visit: Payer: Medicare Other | Admitting: Speech Pathology

## 2016-04-03 ENCOUNTER — Ambulatory Visit: Payer: Medicare Other | Admitting: Physical Therapy

## 2016-04-03 ENCOUNTER — Encounter: Payer: Self-pay | Admitting: Physical Therapy

## 2016-04-03 ENCOUNTER — Encounter: Payer: Self-pay | Admitting: Speech Pathology

## 2016-04-03 DIAGNOSIS — R41841 Cognitive communication deficit: Secondary | ICD-10-CM

## 2016-04-03 DIAGNOSIS — R2681 Unsteadiness on feet: Secondary | ICD-10-CM

## 2016-04-03 DIAGNOSIS — R262 Difficulty in walking, not elsewhere classified: Secondary | ICD-10-CM

## 2016-04-03 NOTE — Therapy (Signed)
[place] MAIN [facility] SERVICES [address] [place], [place], [address] Phone: [phone]   Fax:  [phone]  Physical Therapy Treatment  Patient Details  Name: Lisa [name] MRN: [id number] Date of Birth: [birth year] Referring Provider: Alger Simons T  Encounter Date: 04/03/2016      PT End of Session - 04/03/16 0930    Visit Number 11   Number of Visits 17   Date for PT Re-Evaluation 04-30-16   Authorization Type g codes   PT Start Time 0920   PT Stop Time 1000   PT Time Calculation (min) 40 min   Equipment Utilized During Treatment Gait belt   Activity Tolerance Patient tolerated treatment well   Behavior During Therapy [facility] for tasks assessed/performed      Past Medical History  Diagnosis Date  . Allergy   . Pelvis fracture ([place])   . Wrist fracture, right   . Depression   . Spinal stenosis   . Osteoarthritis   . Hypertension   . Foot fracture     bilateral  . Brain bleed ([place])   . V-tach ([place])   . Chronic systolic CHF (congestive heart failure) ([place])   . Renal disorder     Past Surgical History  Procedure Laterality Date  . Back surgery N/A   . Breast lumpectomy N/A     There were no vitals filed for this visit.      Subjective Assessment - 04/03/16 0926    Subjective Patient is not doing well today, she did not sleep well.    Pertinent History anxiety, high blood pressure, heart disease, Osteoporosis   Limitations Walking;Standing   How long can you stand comfortably? Patient can stand for under 30 minutes   Patient Stated Goals to walk safely   Currently in Pain? No/denies      Therapeutic exercise and neuromuscular training: 1/2 foam flat side up and balance with head turns left and right feet apart and feet together 1/2 foam stepping left and right over foam x 10  tandem standing on 1/2 foam   standing hip abd with YTB x 20   side stepping left and right in parallel bars 10 feet x 3 step ups from  floor to 6 inch stool x 20 bilateral marching in parallel bars x 20 Tilt board fwd/bwd, side to side left and right Tm walking 1. 0 m/hour x 5 mins TM walking side stepping left and right x . 4 miles / hour Patient needs occasional verbal cueing to improve posture and cueing to correctly perform exercises slowly, holding at end of range to increase motor firing of desired muscle to encourage fatigue. Patient is having back pain today after standing up for several minutes.                             PT Education - 04/03/16 0926    Education provided Yes   Education Details safety with  mobility   Person(s) Educated Patient   Methods Explanation   Comprehension Verbalized understanding             PT Long Term Goals - 03/24/16 1149    PT LONG TERM GOAL #1   Title Patient will be independent in home exercise program to improve strength/mobility for better functional independence with ADLs.   Time 8   Period Weeks   Status Partially Met   PT LONG TERM GOAL #2  Title Patient (> 39 years old) will complete five times sit to stand test in < 15 seconds indicating an increased LE strength and improved balance.   Time 8   Period Weeks   Status Partially Met   PT LONG TERM GOAL #3   Title Patient will increase six minute walk test distance to >1000 for progression to community ambulator and improve gait ability   Time 8   Period Weeks   Status Partially Met   PT LONG TERM GOAL #4   Title Patient will reduce timed up and go to <11 seconds to reduce fall risk and demonstrate improved transfer/gait ability.   Time 8   Period Weeks   Status Partially Met               Plan - 04/03/16 0930    Clinical Impression Statement Cueing was needed during the leg press to control motion out/back; frequent instances of decreased eccentric control were exhibited.   Rehab Potential Fair   PT Frequency 2x / week   PT Duration 8 weeks   PT Treatment/Interventions  Therapeutic exercise;Balance training;Therapeutic activities;Gait training   PT Next Visit Plan progress strengthening and gait training and balance training   PT Home Exercise Plan hip abd with YTB hip flex with YTB   Consulted and Agree with Plan of Care Patient;Other (Comment)  care giver      Patient will benefit from skilled therapeutic intervention in order to improve the following deficits and impairments:  Impaired flexibility, Difficulty walking, Decreased safety awareness, Decreased balance, Decreased activity tolerance, Decreased mobility, Pain  Visit Diagnosis: Unsteadiness on feet  Difficulty in walking, not elsewhere classified     Problem List Patient Active Problem List   Diagnosis Date Noted  . New onset atrial fibrillation ([place]) 12/11/2015  . Altered mental state   . Congestive dilated cardiomyopathy ([employer])   . Dementia   . Headache 12/10/2015  . TIA (transient ischemic attack) 12/10/2015  . Influenza A 11/21/2015  . Ventricular tachycardia ([place]) 11/09/2015  . Constipation 09/12/2015  . Insomnia due to anxiety and fear 09/12/2015  . Depression, controlled 09/12/2015  . Bacterial UTI 08/27/2015  . DVT, lower extremity ([place]) 08/27/2015  . TBI (traumatic brain injury) ([place]) 08/22/2015  . ICH (intracerebral hemorrhage) ([place])   . Abscess   . Hypomagnesemia   . SDH (subdural hematoma) ([place])   . Boil of upper extremity   . Pneumonia due to Haemophilus influenzae ([place])   . Chronic systolic CHF (congestive heart failure) ([place])   . Acute on chronic renal failure ([place])   . Hypokalemia   . Bright red blood per rectum   . Dysphagia   . Pressure ulcer 08/16/2015  . Acute respiratory failure with hypoxemia ([place])   . Hypernatremia   . Subdural hematoma ([place]) 08/03/2015  . Lumbar radiculopathy 06/06/2015  . Spinal stenosis, lumbar region, with neurogenic claudication 04/02/2015  . Degenerative lumbar disc 02/15/2015  . Lumbar post-laminectomy syndrome 02/15/2015   . Facet syndrome, lumbar 02/15/2015  . Sacroiliac joint dysfunction 02/15/2015   Alanson Puls, PT, DPT [place] 04/03/2016, 9:32 AM  [place] MAIN [facility] SERVICES [address] [place], [place], [address] Phone: [phone]   Fax:  [phone]  Name: VELVIE THOMASTON MRN: [id number] Date of Birth: Aug 24, 1927

## 2016-04-03 NOTE — Therapy (Signed)
[place] MAIN [facility] SERVICES [address] [place], [place], [address] Phone: [phone]   Fax:  [phone]  Speech Language Pathology Treatment  Patient Details  Name: Lisa [name] MRN: [id number] Date of Birth: 26-Feb-1927 Referring Provider: Dr. Daleen Squibb  Encounter Date: 04/03/2016      End of Session - 04/03/16 1414    Visit Number 11   Number of Visits 13   Date for SLP Re-Evaluation 04/21/16   SLP Start Time 1000   SLP Stop Time  1100   SLP Time Calculation (min) 60 min   Activity Tolerance Patient tolerated treatment well      Past Medical History  Diagnosis Date  . Allergy   . Pelvis fracture ([place])   . Wrist fracture, right   . Depression   . Spinal stenosis   . Osteoarthritis   . Hypertension   . Foot fracture     bilateral  . Brain bleed ([place])   . V-tach ([place])   . Chronic systolic CHF (congestive heart failure) ([place])   . Renal disorder     Past Surgical History  Procedure Laterality Date  . Back surgery N/A   . Breast lumpectomy N/A     There were no vitals filed for this visit.      Subjective Assessment - 04/03/16 1414    Subjective Lisa [name] reports that she did not sleep last night and that she "hasn't even been able to think" of her words for the last 2-3 days. She reports increased stress due to personal financial issues and feels that that is the cause of her communication difficulties.   Currently in Pain? No/denies               ADULT SLP TREATMENT - 04/03/16 0001    General Information   Behavior/Cognition Alert;Cooperative   Treatment Provided   Treatment provided Cognitive-Linquistic   Pain Assessment   Pain Assessment No/denies pain   Cognitive-Linquistic Treatment   Treatment focused on Aphasia;Patient/family/caregiver education   Skilled Treatment HIGH LEVEL WORD FINDING: When provided with a definition, Ms. Meras was able to think of the word with 98% accuracy independently.   When provided with 3 related words, she was able to think of the word being described with 100% accuracy.   Assessment / Recommendations / Plan   Plan Continue with current plan of care   Progression Toward Goals   Progression toward goals Progressing toward goals          SLP Education - 04/03/16 1414    Education provided Yes   Education Details Word finding strategies   Person(s) Educated Patient   Methods Explanation   Comprehension Verbalized understanding            SLP Long Term Goals - 03/31/16 1251    SLP LONG TERM GOAL #1   Title Patient will complete high level word finding tasks with 80% accuracy.   Time 6   Period Weeks   Status Achieved   SLP LONG TERM GOAL #2   Title Patient will generate grammatical, fluent, and cogent sentence to complete abstract/complex linguistic task with 80% accuracy.   Time 6   Period Weeks   Status Partially Met   SLP LONG TERM GOAL #3   Title Patient will demonstrate functional cognitive-communication skills for independent completion of personal responsibilities.   Time 6   Period Weeks   Status Achieved          Plan -  04/03/16 1414    Clinical Impression Statement Lisa [name] exhibits few word-finding difficulties, all of which appear typical for someone of her age, even when faced with a cognitively challenging task. Continued skilled intervention will work to identify barriers to independence in ADLs and to design a home-exercise plan.   Speech Therapy Frequency 2x / week   Duration Other (comment)   Treatment/Interventions SLP instruction and feedback;Compensatory strategies;Patient/family education;Multimodal communcation approach;Language facilitation   Potential to Achieve Goals Good   Potential Considerations Ability to learn/carryover information;Co-morbidities;Cooperation/participation level;Medical prognosis;Previous level of function;Severity of impairments;Family/community support   SLP Home Exercise Plan  Patient given list of games helpful in practicing word finding and expressive skills   Consulted and Agree with Plan of Care Patient;Family member/caregiver   Family Member Consulted Caregiver      Patient will benefit from skilled therapeutic intervention in order to improve the following deficits and impairments:   Cognitive communication deficit    Problem List Patient Active Problem List   Diagnosis Date Noted  . New onset atrial fibrillation ([place]) 12/11/2015  . Altered mental state   . Congestive dilated cardiomyopathy ([place])   . Dementia   . Headache 12/10/2015  . TIA (transient ischemic attack) 12/10/2015  . Influenza A 11/21/2015  . Ventricular tachycardia ([place]) 11/09/2015  . Constipation 09/12/2015  . Insomnia due to anxiety and fear 09/12/2015  . Depression, controlled 09/12/2015  . Bacterial UTI 08/27/2015  . DVT, lower extremity ([place]) 08/27/2015  . TBI (traumatic brain injury) ([place]) 08/22/2015  . ICH (intracerebral hemorrhage) ([place])   . Abscess   . Hypomagnesemia   . SDH (subdural hematoma) ([place])   . Boil of upper extremity   . Pneumonia due to Haemophilus influenzae ([place])   . Chronic systolic CHF (congestive heart failure) ([place])   . Acute on chronic renal failure ([place])   . Hypokalemia   . Bright red blood per rectum   . Dysphagia   . Pressure ulcer 08/16/2015  . Acute respiratory failure with hypoxemia ([place])   . Hypernatremia   . Subdural hematoma ([place]) 08/03/2015  . Lumbar radiculopathy 06/06/2015  . Spinal stenosis, lumbar region, with neurogenic claudication 04/02/2015  . Degenerative lumbar disc 02/15/2015  . Lumbar post-laminectomy syndrome 02/15/2015  . Facet syndrome, lumbar 02/15/2015  . Sacroiliac joint dysfunction 02/15/2015    Lisa [name] 04/03/2016, 2:15 PM  [place] MAIN [facility] SERVICES [address] [place], [place], [address] Phone: [phone]   Fax:  [phone]   Name: JERALINE MARCINEK MRN:  [id number] Date of Birth: [birth year]

## 2016-04-07 ENCOUNTER — Encounter: Payer: Self-pay | Admitting: Speech Pathology

## 2016-04-07 ENCOUNTER — Ambulatory Visit: Payer: Medicare Other | Admitting: Physical Therapy

## 2016-04-07 ENCOUNTER — Encounter: Payer: Self-pay | Admitting: Physical Therapy

## 2016-04-07 ENCOUNTER — Ambulatory Visit: Payer: Medicare Other | Admitting: Speech Pathology

## 2016-04-07 DIAGNOSIS — R41841 Cognitive communication deficit: Secondary | ICD-10-CM | POA: Diagnosis not present

## 2016-04-07 DIAGNOSIS — R262 Difficulty in walking, not elsewhere classified: Secondary | ICD-10-CM

## 2016-04-07 DIAGNOSIS — R2681 Unsteadiness on feet: Secondary | ICD-10-CM

## 2016-04-07 NOTE — Therapy (Signed)
[employer] [employer] MAIN [facility] SERVICES [address] [place], [place], [address] Phone: [phone]   Fax:  [phone]  Speech Language Pathology Treatment  Patient Details  Name: Lisa [name] MRN: [id number] Date of Birth: 09-14-1927 Referring Provider: Dr. Daleen Squibb  Encounter Date: 04/07/2016      End of Session - 04/07/16 1240    Visit Number 12   Number of Visits 13   Date for SLP Re-Evaluation 04/21/16   SLP Start Time 50   SLP Stop Time  1000   SLP Time Calculation (min) 45 min   Activity Tolerance Patient tolerated treatment well      Past Medical History  Diagnosis Date  . Allergy   . Pelvis fracture ([place])   . Wrist fracture, right   . Depression   . Spinal stenosis   . Osteoarthritis   . Hypertension   . Foot fracture     bilateral  . Brain bleed ([place])   . V-tach ([place])   . Chronic systolic CHF (congestive heart failure) ([place])   . Renal disorder     Past Surgical History  Procedure Laterality Date  . Back surgery N/A   . Breast lumpectomy N/A     There were no vitals filed for this visit.      Subjective Assessment - 04/07/16 1239    Subjective Lisa [name] was alert and engaged throughout the session. She continues to have high levels of personal stress.   Currently in Pain? No/denies               ADULT SLP TREATMENT - 04/07/16 0001    General Information   Behavior/Cognition Alert;Cooperative   Treatment Provided   Treatment provided Cognitive-Linquistic   Pain Assessment   Pain Assessment No/denies pain   Cognitive-Linquistic Treatment   Treatment focused on Aphasia;Patient/family/caregiver education   Skilled Treatment HIGH LEVEL WORD FINDING: When provided with a definition, Lisa [name] was able to think of the word with 95% accuracy independently.  When given a category (e.g. men's names that begin with 'D'), She was able to think of at least 2 terms 95% of the time independently and at least 5 terms  60% of the time with min cues.   Assessment / Recommendations / Plan   Plan Continue with current plan of care   Progression Toward Goals   Progression toward goals Progressing toward goals          SLP Education - 04/07/16 1239    Education provided Yes   Education Details Stress reduction techniques; word finding strategies   Person(s) Educated Patient   Methods Explanation   Comprehension Verbalized understanding            SLP Long Term Goals - 03/31/16 1251    SLP LONG TERM GOAL #1   Title Patient will complete high level word finding tasks with 80% accuracy.   Time 6   Period Weeks   Status Achieved   SLP LONG TERM GOAL #2   Title Patient will generate grammatical, fluent, and cogent sentence to complete abstract/complex linguistic task with 80% accuracy.   Time 6   Period Weeks   Status Partially Met   SLP LONG TERM GOAL #3   Title Patient will demonstrate functional cognitive-communication skills for independent completion of personal responsibilities.   Time 6   Period Weeks   Status Achieved          Plan - 04/07/16 1240    Clinical  Impression Statement Lisa [name] exhibits few word-finding difficulties, all of which appear typical for someone of her age, even when faced with a cognitively challenging task. Continued skilled intervention will work to identify barriers to independence in ADLs and to design a home-exercise plan.   Speech Therapy Frequency 2x / week   Duration Other (comment)   Treatment/Interventions SLP instruction and feedback;Compensatory strategies;Patient/family education;Multimodal communcation approach;Language facilitation   Potential to Achieve Goals Good   Potential Considerations Ability to learn/carryover information;Co-morbidities;Cooperation/participation level;Medical prognosis;Previous level of function;Severity of impairments;Family/community support   SLP Home Exercise Plan Patient given list of games helpful in practicing  word finding and expressive skills   Consulted and Agree with Plan of Care Patient;Family member/caregiver   Family Member Consulted Caregiver      Patient will benefit from skilled therapeutic intervention in order to improve the following deficits and impairments:   Cognitive communication deficit    Problem List Patient Active Problem List   Diagnosis Date Noted  . New onset atrial fibrillation ([place]) 12/11/2015  . Altered mental state   . Congestive dilated cardiomyopathy ([place])   . Dementia   . Headache 12/10/2015  . TIA (transient ischemic attack) 12/10/2015  . Influenza A 11/21/2015  . Ventricular tachycardia ([place]) 11/09/2015  . Constipation 09/12/2015  . Insomnia due to anxiety and fear 09/12/2015  . Depression, controlled 09/12/2015  . Bacterial UTI 08/27/2015  . DVT, lower extremity ([place]) 08/27/2015  . TBI (traumatic brain injury) ([place]) 08/22/2015  . ICH (intracerebral hemorrhage) ([place])   . Abscess   . Hypomagnesemia   . SDH (subdural hematoma) ([place])   . Boil of upper extremity   . Pneumonia due to Haemophilus influenzae ([place])   . Chronic systolic CHF (congestive heart failure) ([place])   . Acute on chronic renal failure ([place])   . Hypokalemia   . Bright red blood per rectum   . Dysphagia   . Pressure ulcer 08/16/2015  . Acute respiratory failure with hypoxemia ([place])   . Hypernatremia   . Subdural hematoma ([place]) 08/03/2015  . Lumbar radiculopathy 06/06/2015  . Spinal stenosis, lumbar region, with neurogenic claudication 04/02/2015  . Degenerative lumbar disc 02/15/2015  . Lumbar post-laminectomy syndrome 02/15/2015  . Facet syndrome, lumbar 02/15/2015  . Sacroiliac joint dysfunction 02/15/2015    Lisa [name] 04/07/2016, 12:41 PM  [place] MAIN [facility] SERVICES [address] [place], [place], [address] Phone: [phone]   Fax:  [phone]   Name: Lisa [name] MRN: [id number] Date of Birth: [birth year]

## 2016-04-07 NOTE — Therapy (Signed)
[place] MAIN [facility] SERVICES [address] [place], [place], [address] Phone: [phone]   Fax:  [phone]  Physical Therapy Treatment  Patient Details  Name: Lisa [name] MRN: [id number] Date of Birth: [birth year] Referring Provider: Alger Simons T  Encounter Date: 04/07/2016      PT End of Session - 04/07/16 0839    Visit Number 12   Number of Visits 17   Date for PT Re-Evaluation 05-12-16   Authorization Type g codes   PT Start Time 0830   PT Stop Time 0910   PT Time Calculation (min) 40 min   Equipment Utilized During Treatment Gait belt   Activity Tolerance Patient tolerated treatment well   Behavior During Therapy [facility] for tasks assessed/performed      Past Medical History  Diagnosis Date  . Allergy   . Pelvis fracture ([place])   . Wrist fracture, right   . Depression   . Spinal stenosis   . Osteoarthritis   . Hypertension   . Foot fracture     bilateral  . Brain bleed ([place])   . V-tach ([place])   . Chronic systolic CHF (congestive heart failure) ([place])   . Renal disorder     Past Surgical History  Procedure Laterality Date  . Back surgery N/A   . Breast lumpectomy N/A     There were no vitals filed for this visit.      Subjective Assessment - 04/07/16 0838    Subjective Patient is not doing well today.   Pertinent History anxiety, high blood pressure, heart disease, Osteoporosis   Limitations Walking;Standing   How long can you stand comfortably? Patient can stand for under 30 minutes   Patient Stated Goals to walk safely   Currently in Pain? No/denies    Therapeutic exercise:   standing hip abd with YTB x 20  side stepping left and right in parallel bars 10 feet x 3 standing on blue foam with cone reaching x 20 across midline step ups from floor to 6 inch stool x 20 bilateral sit to stand x 10 marching in parallel bars x 20  Fwd/retro walking with 1UE x 3 laps  Mini squat 2x10 Standing hip abd  2x10 Standing ankle PF/ DF 2x10 LAQ 2x10 Gait training with TM at 1. 0 x 5 min and 2 minutes with UE support Side stepping over 1/2 foam x 10  Pt requires mod verbal and tactile cues for proper exercise performance                            PT Education - 04/07/16 0838    Education provided Yes   Education Details safety with mobility   Person(s) Educated Patient   Methods Explanation   Comprehension Verbalized understanding             PT Long Term Goals - 03/24/16 1149    PT LONG TERM GOAL #1   Title Patient will be independent in home exercise program to improve strength/mobility for better functional independence with ADLs.   Time 8   Period Weeks   Status Partially Met   PT LONG TERM GOAL #2   Title Patient (> 80 years old) will complete five times sit to stand test in < 15 seconds indicating an increased LE strength and improved balance.   Time 8   Period Weeks   Status Partially Met   PT LONG TERM GOAL #  3   Title Patient will increase six minute walk test distance to >1000 for progression to community ambulator and improve gait ability   Time 8   Period Weeks   Status Partially Met   PT LONG TERM GOAL #4   Title Patient will reduce timed up and go to <11 seconds to reduce fall risk and demonstrate improved transfer/gait ability.   Time 8   Period Weeks   Status Partially Met               Plan - 04/07/16 0839    Clinical Impression Statement Pt required min to mod cues for correct exercise sequencing with verbal and tactile cues and CGA for safety   Rehab Potential Fair   PT Frequency 2x / week   PT Duration 8 weeks   PT Treatment/Interventions Therapeutic exercise;Balance training;Therapeutic activities;Gait training   PT Next Visit Plan progress strengthening and gait training and balance training   PT Home Exercise Plan hip abd with YTB hip flex with YTB   Consulted and Agree with Plan of Care Patient;Other (Comment)   care giver      Patient will benefit from skilled therapeutic intervention in order to improve the following deficits and impairments:  Impaired flexibility, Difficulty walking, Decreased safety awareness, Decreased balance, Decreased activity tolerance, Decreased mobility, Pain  Visit Diagnosis: Unsteadiness on feet  Difficulty in walking, not elsewhere classified     Problem List Patient Active Problem List   Diagnosis Date Noted  . New onset atrial fibrillation ([place]) 12/11/2015  . Altered mental state   . Congestive dilated cardiomyopathy ([place])   . Dementia   . Headache 12/10/2015  . TIA (transient ischemic attack) 12/10/2015  . Influenza A 11/21/2015  . Ventricular tachycardia ([place]) 11/09/2015  . Constipation 09/12/2015  . Insomnia due to anxiety and fear 09/12/2015  . Depression, controlled 09/12/2015  . Bacterial UTI 08/27/2015  . DVT, lower extremity ([place]) 08/27/2015  . TBI (traumatic brain injury) ([place]) 08/22/2015  . ICH (intracerebral hemorrhage) ([place])   . Abscess   . Hypomagnesemia   . SDH (subdural hematoma) ([place])   . Boil of upper extremity   . Pneumonia due to Haemophilus influenzae ([place])   . Chronic systolic CHF (congestive heart failure) ([place])   . Acute on chronic renal failure ([place])   . Hypokalemia   . Bright red blood per rectum   . Dysphagia   . Pressure ulcer 08/16/2015  . Acute respiratory failure with hypoxemia ([place])   . Hypernatremia   . Subdural hematoma ([place]) 08/03/2015  . Lumbar radiculopathy 06/06/2015  . Spinal stenosis, lumbar region, with neurogenic claudication 04/02/2015  . Degenerative lumbar disc 02/15/2015  . Lumbar post-laminectomy syndrome 02/15/2015  . Facet syndrome, lumbar 02/15/2015  . Sacroiliac joint dysfunction 02/15/2015   Alanson Puls, PT, DPT [place] 04/07/2016, 8:41 AM  [place] MAIN [facility] SERVICES [address] [place], [place], [address] Phone:  [phone]   Fax:  [phone]  Name: JOYE WESENBERG MRN: [id number] Date of Birth: [birth year]

## 2016-04-09 ENCOUNTER — Encounter: Payer: Self-pay | Admitting: Speech Pathology

## 2016-04-09 ENCOUNTER — Ambulatory Visit: Payer: Medicare Other | Admitting: Speech Pathology

## 2016-04-09 DIAGNOSIS — R41841 Cognitive communication deficit: Secondary | ICD-10-CM | POA: Diagnosis not present

## 2016-04-09 NOTE — Therapy (Signed)
[place] MAIN [facility] SERVICES [address] [place], [place], [address] Phone: [phone]   Fax:  [phone]  Speech Language Pathology Treatment  Patient Details  Name: Lisa [name] MRN: [id number] Date of Birth: [birth year] Referring Provider: Dr. Daleen Squibb  Encounter Date: 04/09/2016      End of Session - 04/09/16 1058    Visit Number 13   Number of Visits 13   Date for SLP Re-Evaluation 04/21/16   SLP Start Time 0950   SLP Stop Time  9735   SLP Time Calculation (min) 55 min   Activity Tolerance Patient tolerated treatment well      Past Medical History  Diagnosis Date  . Allergy   . Pelvis fracture ([place])   . Wrist fracture, right   . Depression   . Spinal stenosis   . Osteoarthritis   . Hypertension   . Foot fracture     bilateral  . Brain bleed ([place])   . V-tach ([place])   . Chronic systolic CHF (congestive heart failure) ([place])   . Renal disorder     Past Surgical History  Procedure Laterality Date  . Back surgery N/A   . Breast lumpectomy N/A     There were no vitals filed for this visit.      Subjective Assessment - 04/09/16 1058    Subjective Lisa [name] was alert and engaged throughout the session. She feels that her voice has been better for the last couple of days. She continues to have high levels of personal stress.   Currently in Pain? No/denies               ADULT SLP TREATMENT - 04/09/16 0001    General Information   Behavior/Cognition Alert;Cooperative   Treatment Provided   Treatment provided Cognitive-Linquistic   Pain Assessment   Pain Assessment No/denies pain   Cognitive-Linquistic Treatment   Treatment focused on Aphasia;Patient/family/caregiver education   Skilled Treatment HIGH LEVEL WORD FINDING: When provided with a definition, Lisa [name] was able to think of the word with 90% accuracy with min cues.  When given a word, she was able to think of the irregular plural with 85% accuracy  independently and use that plural in a sentence with 100% accuracy independently. She was able to complete mid-level analogies with 80% accuracy with mod cues and use those words in a sentence with 100% accuracy independently.   Assessment / Recommendations / Plan   Plan Continue with current plan of care   Progression Toward Goals   Progression toward goals Progressing toward goals          SLP Education - 04/09/16 1058    Education provided Yes   Education Details Word finding strategies   Person(s) Educated Patient   Methods Explanation   Comprehension Verbalized understanding            SLP Long Term Goals - 03/31/16 1251    SLP LONG TERM GOAL #1   Title Patient will complete high level word finding tasks with 80% accuracy.   Time 6   Period Weeks   Status Achieved   SLP LONG TERM GOAL #2   Title Patient will generate grammatical, fluent, and cogent sentence to complete abstract/complex linguistic task with 80% accuracy.   Time 6   Period Weeks   Status Partially Met   SLP LONG TERM GOAL #3   Title Patient will demonstrate functional cognitive-communication skills for independent completion of personal responsibilities.  Time 6   Period Weeks   Status Achieved          Plan - 04/09/16 1059    Clinical Impression Statement Lisa [name] exhibits few word-finding difficulties, all of which appear typical for someone of her age, even when faced with a cognitively challenging task. Continued skilled intervention will work to identify barriers to independence in ADLs and to design a home-exercise plan.   Speech Therapy Frequency 2x / week   Duration Other (comment)   Treatment/Interventions SLP instruction and feedback;Compensatory strategies;Patient/family education;Multimodal communcation approach;Language facilitation   Potential to Achieve Goals Good   Potential Considerations Ability to learn/carryover information;Co-morbidities;Cooperation/participation  level;Medical prognosis;Previous level of function;Severity of impairments;Family/community support   SLP Home Exercise Plan Patient given list of games helpful in practicing word finding and expressive skills   Consulted and Agree with Plan of Care Patient;Family member/caregiver   Family Member Consulted Caregiver      Patient will benefit from skilled therapeutic intervention in order to improve the following deficits and impairments:   Cognitive communication deficit    Problem List Patient Active Problem List   Diagnosis Date Noted  . New onset atrial fibrillation ([place]) 12/11/2015  . Altered mental state   . Congestive dilated cardiomyopathy ([place])   . Dementia   . Headache 12/10/2015  . TIA (transient ischemic attack) 12/10/2015  . Influenza A 11/21/2015  . Ventricular tachycardia ([place]) 11/09/2015  . Constipation 09/12/2015  . Insomnia due to anxiety and fear 09/12/2015  . Depression, controlled 09/12/2015  . Bacterial UTI 08/27/2015  . DVT, lower extremity ([place]) 08/27/2015  . TBI (traumatic brain injury) ([place]) 08/22/2015  . ICH (intracerebral hemorrhage) ([place])   . Abscess   . Hypomagnesemia   . SDH (subdural hematoma) ([place])   . Boil of upper extremity   . Pneumonia due to Haemophilus influenzae ([place])   . Chronic systolic CHF (congestive heart failure) ([place])   . Acute on chronic renal failure ([place])   . Hypokalemia   . Bright red blood per rectum   . Dysphagia   . Pressure ulcer 08/16/2015  . Acute respiratory failure with hypoxemia ([place])   . Hypernatremia   . Subdural hematoma ([place]) 08/03/2015  . Lumbar radiculopathy 06/06/2015  . Spinal stenosis, lumbar region, with neurogenic claudication 04/02/2015  . Degenerative lumbar disc 02/15/2015  . Lumbar post-laminectomy syndrome 02/15/2015  . Facet syndrome, lumbar 02/15/2015  . Sacroiliac joint dysfunction 02/15/2015    Leroy Kennedy 04/09/2016, 10:59 AM  [place] MAIN [facility]  SERVICES [address] [place], [place], [address] Phone: [phone]   Fax:  [phone]   Name: Lisa [name] MRN: [id number] Date of Birth: 01/27/1927

## 2016-04-14 ENCOUNTER — Other Ambulatory Visit: Payer: Self-pay

## 2016-04-14 ENCOUNTER — Ambulatory Visit: Payer: Medicare Other | Admitting: Physical Therapy

## 2016-04-14 ENCOUNTER — Encounter: Payer: Self-pay | Admitting: Speech Pathology

## 2016-04-14 ENCOUNTER — Encounter: Payer: Self-pay | Admitting: Physical Therapy

## 2016-04-14 ENCOUNTER — Ambulatory Visit: Payer: Medicare Other | Admitting: Speech Pathology

## 2016-04-14 DIAGNOSIS — R2681 Unsteadiness on feet: Secondary | ICD-10-CM

## 2016-04-14 DIAGNOSIS — R41841 Cognitive communication deficit: Secondary | ICD-10-CM | POA: Diagnosis not present

## 2016-04-14 DIAGNOSIS — R262 Difficulty in walking, not elsewhere classified: Secondary | ICD-10-CM

## 2016-04-14 MED ORDER — APIXABAN 2.5 MG PO TABS: 2.5000 mg | ORAL_TABLET | Freq: Two times a day (BID) | ORAL | 3 refills | Status: DC

## 2016-04-14 NOTE — Therapy (Signed)
[place] MAIN [facility] SERVICES [address] [place], [place], [address] Phone: [phone]   Fax:  [phone]  Speech Language Pathology Treatment  Patient Details  Name: Lisa [name] MRN: [id number] Date of Birth: 03-10-1927 Referring Provider: Dr. Daleen Squibb  Encounter Date: 04/14/2016      End of Session - 04/14/16 1306    Visit Number 14   Number of Visits 14   Date for SLP Re-Evaluation 04/21/16   SLP Start Time 1000   SLP Stop Time  1050   SLP Time Calculation (min) 50 min   Activity Tolerance Patient tolerated treatment well      Past Medical History:  Diagnosis Date  . Allergy   . Brain bleed ([place])   . Chronic systolic CHF (congestive heart failure) ([place])   . Depression   . Foot fracture    bilateral  . Hypertension   . Osteoarthritis   . Pelvis fracture ([place])   . Renal disorder   . Spinal stenosis   . V-tach ([place])   . Wrist fracture, right     Past Surgical History:  Procedure Laterality Date  . BACK SURGERY N/A   . BREAST LUMPECTOMY N/A     There were no vitals filed for this visit.      Subjective Assessment - 04/14/16 1302    Subjective Lisa [name] was alert and pleasant during therapy session. Pt feels that this is "not going to be a good day."    Currently in Pain? No/denies               ADULT SLP TREATMENT - 04/14/16 0001      General Information   Behavior/Cognition Alert;Cooperative     Treatment Provided   Treatment provided Cognitive-Linquistic     Pain Assessment   Pain Assessment No/denies pain     Cognitive-Linquistic Treatment   Treatment focused on Aphasia;Patient/family/caregiver education   Skilled Treatment HIGH LEVEL WORD FINDING: Pt named items in a given category with 80% acc (naming at least 10).  Pt was able to respond to complex questoins w/80% acc w/no difficulties observed w/word finding. She was able to complete mid-level analogies with 90% accuracy with mod cues and  use those words in a sentence with 100% accuracy independently.      Assessment / Recommendations / Plan   Plan Continue with current plan of care;Goals updated     Progression Toward Goals   Progression toward goals Progressing toward goals          SLP Education - 04/14/16 1303    Education provided Yes   Education Details Word finding strategies.   Person(s) Educated Patient   Methods Explanation   Comprehension Verbalized understanding            SLP Long Term Goals - 04/14/16 1308      SLP LONG TERM GOAL #1   Title Patient will complete high level word finding tasks with 80% accuracy.   Time 6   Period Weeks   Status Achieved     SLP LONG TERM GOAL #2   Title Patient will generate grammatical, fluent, and cogent sentence to complete abstract/complex linguistic task with 80% accuracy.   Time 6   Period Weeks   Status Partially Met     SLP LONG TERM GOAL #3   Title Patient will demonstrate functional cognitive-communication skills for independent completion of personal responsibilities.   Time 6   Period Weeks   Status Achieved  Plan - 04/14/16 1304    Clinical Impression Statement Lisa [name] exhibits few word-finding difficulties, all of which appear typical for someone of her age, even when faced with a cognitively challenging task. Continued short term skilled intervention will work to continue to build independence in ADLs and to Agricultural consultant a Network engineer.   Speech Therapy Frequency 1x /week   Duration 1 week   Treatment/Interventions SLP instruction and feedback;Compensatory strategies;Patient/family education;Multimodal communcation approach;Language facilitation   Potential to Achieve Goals Good   Potential Considerations Ability to learn/carryover information;Co-morbidities;Cooperation/participation level;Medical prognosis;Previous level of function;Severity of impairments;Family/community support   SLP Home Exercise Plan Patient given list  of games helpful in practicing word finding and expressive skills   Consulted and Agree with Plan of Care Patient;Family member/caregiver      Patient will benefit from skilled therapeutic intervention in order to improve the following deficits and impairments:   Cognitive communication deficit - Plan: SLP PLAN OF CARE CERT/RE-CERT    Problem List Patient Active Problem List   Diagnosis Date Noted  . New onset atrial fibrillation ([place]) 12/11/2015  . Altered mental state   . Congestive dilated cardiomyopathy ([place])   . Dementia   . Headache 12/10/2015  . TIA (transient ischemic attack) 12/10/2015  . Influenza A 11/21/2015  . Ventricular tachycardia ([place]) 11/09/2015  . Constipation 09/12/2015  . Insomnia due to anxiety and fear 09/12/2015  . Depression, controlled 09/12/2015  . Bacterial UTI 08/27/2015  . DVT, lower extremity ([place]) 08/27/2015  . TBI (traumatic brain injury) ([place]) 08/22/2015  . ICH (intracerebral hemorrhage) ([place])   . Abscess   . Hypomagnesemia   . SDH (subdural hematoma) ([place])   . Boil of upper extremity   . Pneumonia due to Haemophilus influenzae ([place])   . Chronic systolic CHF (congestive heart failure) ([place])   . Acute on chronic renal failure ([place])   . Hypokalemia   . Bright red blood per rectum   . Dysphagia   . Pressure ulcer 08/16/2015  . Acute respiratory failure with hypoxemia ([place])   . Hypernatremia   . Subdural hematoma ([place]) 08/03/2015  . Lumbar radiculopathy 06/06/2015  . Spinal stenosis, lumbar region, with neurogenic claudication 04/02/2015  . Degenerative lumbar disc 02/15/2015  . Lumbar post-laminectomy syndrome 02/15/2015  . Facet syndrome, lumbar 02/15/2015  . Sacroiliac joint dysfunction 02/15/2015    [place],Judd Mccubbin 04/14/2016, 1:15 PM  [place] MAIN [facility] SERVICES [address] [place], [place], [address] Phone: [phone]   Fax:  [phone]   Name: Lisa [name] MRN: [id number] Date of  Birth: [birth year]

## 2016-04-14 NOTE — Therapy (Addendum)
[place] MAIN [facility] SERVICES [address] [place], [place], [address] Phone: [phone]   Fax:  [phone]  Physical Therapy Treatment  Patient Details  Name: Lisa [name] MRN: [id number] Date of Birth: [birth year] Referring Provider: Alger Simons T  Encounter Date: 04/14/2016      PT End of Session - 04/14/16 0909    Visit Number 13   Number of Visits 17   Date for PT Re-Evaluation 2016-05-14   Authorization Type g codes   PT Start Time 0900   PT Stop Time 0940   PT Time Calculation (min) 40 min   Equipment Utilized During Treatment Gait belt   Activity Tolerance Patient tolerated treatment well   Behavior During Therapy [facility] for tasks assessed/performed      Past Medical History:  Diagnosis Date  . Allergy   . Brain bleed ([place])   . Chronic systolic CHF (congestive heart failure) ([place])   . Depression   . Foot fracture    bilateral  . Hypertension   . Osteoarthritis   . Pelvis fracture ([place])   . Renal disorder   . Spinal stenosis   . V-tach ([place])   . Wrist fracture, right     Past Surgical History:  Procedure Laterality Date  . BACK SURGERY N/A   . BREAST LUMPECTOMY N/A     There were no vitals filed for this visit.      Subjective Assessment - 04/14/16 0901    Subjective Patient is doing well today.   Pertinent History anxiety, high blood pressure, heart disease, Osteoporosis   Limitations Walking;Standing   How long can you stand comfortably? Patient can stand for under 30 minutes   Patient Stated Goals to walk safely                                 PT Education - 04/14/16 0902    Education provided Yes   Education Details HEP review   Person(s) Educated Patient   Methods Explanation   Comprehension Verbalized understanding             PT Long Term Goals - 03/24/16 1149      PT LONG TERM GOAL #1   Title Patient will be independent in home exercise program to improve  strength/mobility for better functional independence with ADLs.   Time 8   Period Weeks   Status Partially Met     PT LONG TERM GOAL #2   Title Patient (> 2 years old) will complete five times sit to stand test in < 15 seconds indicating an increased LE strength and improved balance.   Time 8   Period Weeks   Status Partially Met     PT LONG TERM GOAL #3   Title Patient will increase six minute walk test distance to >1000 for progression to community ambulator and improve gait ability   Time 8   Period Weeks   Status Partially Met     PT LONG TERM GOAL #4   Title Patient will reduce timed up and go to <11 seconds to reduce fall risk and demonstrate improved transfer/gait ability.   Time 8   Period Weeks   Status Partially Met               Plan - 04/14/16 0909    Clinical Impression Statement Patietn continues to have unsteadiness with ambulation and weakness in BLE and  is able to participae in static and dynamic standing balance and  LE strengthening with no pain behaviors.    Rehab Potential Fair   PT Frequency 2x / week   PT Duration 8 weeks   PT Treatment/Interventions Therapeutic exercise;Balance training;Therapeutic activities;Gait training   PT Next Visit Plan progress strengthening and gait training and balance training   PT Home Exercise Plan hip abd with YTB hip flex with YTB   Consulted and Agree with Plan of Care Patient;Other (Comment)  care giver      Patient will benefit from skilled therapeutic intervention in order to improve the following deficits and impairments:  Impaired flexibility, Difficulty walking, Decreased safety awareness, Decreased balance, Decreased activity tolerance, Decreased mobility, Pain  Visit Diagnosis: Unsteadiness on feet  Difficulty in walking, not elsewhere classified     Problem List Patient Active Problem List   Diagnosis Date Noted  . New onset atrial fibrillation ([place]) 12/11/2015  . Altered mental state   .  Congestive dilated cardiomyopathy ([place])   . Dementia   . Headache 12/10/2015  . TIA (transient ischemic attack) 12/10/2015  . Influenza A 11/21/2015  . Ventricular tachycardia ([place]) 11/09/2015  . Constipation 09/12/2015  . Insomnia due to anxiety and fear 09/12/2015  . Depression, controlled 09/12/2015  . Bacterial UTI 08/27/2015  . DVT, lower extremity ([place]) 08/27/2015  . TBI (traumatic brain injury) ([place]) 08/22/2015  . ICH (intracerebral hemorrhage) ([place])   . Abscess   . Hypomagnesemia   . SDH (subdural hematoma) ([place])   . Boil of upper extremity   . Pneumonia due to Haemophilus influenzae ([place])   . Chronic systolic CHF (congestive heart failure) ([place])   . Acute on chronic renal failure ([place])   . Hypokalemia   . Bright red blood per rectum   . Dysphagia   . Pressure ulcer 08/16/2015  . Acute respiratory failure with hypoxemia ([place])   . Hypernatremia   . Subdural hematoma ([place]) 08/03/2015  . Lumbar radiculopathy 06/06/2015  . Spinal stenosis, lumbar region, with neurogenic claudication 04/02/2015  . Degenerative lumbar disc 02/15/2015  . Lumbar post-laminectomy syndrome 02/15/2015  . Facet syndrome, lumbar 02/15/2015  . Sacroiliac joint dysfunction 02/15/2015    Alanson Puls 04/14/2016, 9:19 AM  [place] MAIN [facility] SERVICES [address] [place], [place], [address] Phone: [phone]   Fax:  [phone]  Name: Lisa [name] MRN: [id number] Date of Birth: 08/27/1927  THER-EX Standing exercises with RTB BLE : Marching 2 x 10; SLR 2 x 10; Abduction 2 x 10; Extension 2 x 10; Heel raises 2 x 10; Eccentric step downs x 10 BLE Resisted side-steeping RTB 4 lengths x 2; Sit to stand without UE support 2 x 10; Step-ups to 6" step x 10 bilateral; Quantum leg press 90 x 20 x 3  NEUROMUSCULAR RE-EDUCATION  Matrix fwd/bwd walking x 10  Toe tapping 6 inch stool without UE assist Tandem gait in // bars x 4 laps  Patient  needs occasional verbal cueing to improve posture and cueing to correctly perform exercises slowly, holding at end of range to increase motor firing of desired muscle to encourage fatigue.

## 2016-04-16 ENCOUNTER — Encounter: Payer: Self-pay | Admitting: Physical Therapy

## 2016-04-16 ENCOUNTER — Encounter: Payer: Self-pay | Admitting: Speech Pathology

## 2016-04-16 ENCOUNTER — Ambulatory Visit: Payer: Medicare Other | Admitting: Physical Therapy

## 2016-04-16 ENCOUNTER — Ambulatory Visit: Payer: Medicare Other | Admitting: Speech Pathology

## 2016-04-16 DIAGNOSIS — R41841 Cognitive communication deficit: Secondary | ICD-10-CM | POA: Diagnosis not present

## 2016-04-16 DIAGNOSIS — R262 Difficulty in walking, not elsewhere classified: Secondary | ICD-10-CM

## 2016-04-16 DIAGNOSIS — R2681 Unsteadiness on feet: Secondary | ICD-10-CM

## 2016-04-16 NOTE — Therapy (Signed)
[place] MAIN [facility] SERVICES [address] [place], [place], [address] Phone: [phone]   Fax:  [phone]  Speech Language Pathology Discharge Summary  Patient Details  Name: Lisa [name] MRN: [id number] Date of Birth: Sep 24, 1926 Referring Provider: Dr. Daleen [name]  Encounter Date: 04/16/2016      End of Session - 04/16/16 1206    Visit Number 1   Number of Visits 1   Date for SLP Re-Evaluation 04/28/16   SLP Start Time 1000   SLP Stop Time  1030   SLP Time Calculation (min) 30 min   Activity Tolerance Patient tolerated treatment well      Past Medical History:  Diagnosis Date  . Allergy   . Brain bleed ([place])   . Chronic systolic CHF (congestive heart failure) ([place])   . Depression   . Foot fracture    bilateral  . Hypertension   . Osteoarthritis   . Pelvis fracture ([place])   . Renal disorder   . Spinal stenosis   . V-tach ([place])   . Wrist fracture, right     Past Surgical History:  Procedure Laterality Date  . BACK SURGERY N/A   . BREAST LUMPECTOMY N/A     There were no vitals filed for this visit.      Subjective Assessment - 04/16/16 1204    Subjective Lisa [name] was accompanied by her family member to session today. Pt stated, "she did not want a full session." Pt agreeable to discharge from [place] today.   Patient is accompained by: Family member   Currently in Pain? No/denies               ADULT SLP TREATMENT - 04/16/16 0001      General Information   Behavior/Cognition Alert;Cooperative     Treatment Provided   Treatment provided Cognitive-Linquistic     Pain Assessment   Pain Assessment No/denies pain     Cognitive-Linquistic Treatment   Treatment focused on Aphasia;Patient/family/caregiver education   Skilled Treatment HIGH LEVEL WORD FINDING: Pt named items given a clue w/90% acc w/no difficulties w/word finding. She was able to complete mid-level analogies with 86% accuracy independently.      Assessment / Recommendations / Plan   Plan Discharge SLP treatment due to (comment)  goals met     Progression Toward Goals   Progression toward goals Goals met, education completed, patient discharged from SLP          SLP Education - 04/16/16 1205    Education provided Yes   Education Details Reveiwed discharge plan and discussed ongoing HEP, such as word puzzles and logic puzzles   Person(s) Educated Patient;Child(ren)   Methods Explanation   Comprehension Verbalized understanding            SLP Long Term Goals - 04/16/16 1212      SLP LONG TERM GOAL #1   Title Patient will complete high level word finding tasks with 80% accuracy.   Time 6   Period Weeks   Status Achieved     SLP LONG TERM GOAL #2   Title Patient will generate grammatical, fluent, and cogent sentence to complete abstract/complex linguistic task with 80% accuracy.   Time 2   Period Weeks   Status Achieved     SLP LONG TERM GOAL #3   Title Patient will demonstrate functional cognitive-communication skills for independent completion of personal responsibilities.   Time 6   Period Weeks   Status Achieved  Plan - 05-04-16 1208    Clinical Impression Statement Lisa [name] exhibits few word-finding difficulties, all of which appear typical for someone of her age, even when faced with a cognitively challenging task. Pt demonstrates functional communication for ADL's and is able to compensate well with use of strategies when she has difficulty finding a word. Pt has met current goals and is ready for discharge from Speech Therapy services with continued HEP.    Treatment/Interventions SLP instruction and feedback;Compensatory strategies;Patient/family education;Multimodal communcation approach;Language facilitation   Potential to Achieve Goals Good   Potential Considerations Ability to learn/carryover information;Co-morbidities;Cooperation/participation level;Medical prognosis;Previous level of  function;Severity of impairments;Family/community support   SLP Home Exercise Plan Patient given list of games helpful in practicing word finding and expressive skills   Consulted and Agree with Plan of Care Patient;Family member/caregiver   Family Member Consulted Family member      Patient will benefit from skilled therapeutic intervention in order to improve the following deficits and impairments:   Cognitive communication deficit      G-Codes - May 04, 2016 1214    Functional Assessment Tool Used Clinical judgment   Functional Limitations Spoken language expressive   Spoken Language Expression Current Status [phone]) At least 1 percent but less than 20 percent impaired, limited or restricted   Spoken Language Expression Goal Status (U0454) At least 1 percent but less than 20 percent impaired, limited or restricted   Spoken Language Expression Discharge Status [phone]) At least 1 percent but less than 20 percent impaired, limited or restricted      Problem List Patient Active Problem List   Diagnosis Date Noted  . New onset atrial fibrillation ([place]) 12/11/2015  . Altered mental state   . Congestive dilated cardiomyopathy ([place])   . Dementia   . Headache 12/10/2015  . TIA (transient ischemic attack) 12/10/2015  . Influenza A 11/21/2015  . Ventricular tachycardia ([place]) 11/09/2015  . Constipation 09/12/2015  . Insomnia due to anxiety and fear 09/12/2015  . Depression, controlled 09/12/2015  . Bacterial UTI 08/27/2015  . DVT, lower extremity ([place]) 08/27/2015  . TBI (traumatic brain injury) ([place]) 08/22/2015  . ICH (intracerebral hemorrhage) ([place])   . Abscess   . Hypomagnesemia   . SDH (subdural hematoma) ([place])   . Boil of upper extremity   . Pneumonia due to Haemophilus influenzae ([place])   . Chronic systolic CHF (congestive heart failure) ([place])   . Acute on chronic renal failure ([place])   . Hypokalemia   . Bright red blood per rectum   . Dysphagia   . Pressure ulcer 08/16/2015   . Acute respiratory failure with hypoxemia ([place])   . Hypernatremia   . Subdural hematoma ([place]) 08/03/2015  . Lumbar radiculopathy 06/06/2015  . Spinal stenosis, lumbar region, with neurogenic claudication 04/02/2015  . Degenerative lumbar disc 02/15/2015  . Lumbar post-laminectomy syndrome 02/15/2015  . Facet syndrome, lumbar 02/15/2015  . Sacroiliac joint dysfunction 02/15/2015    SPEECH THERAPY DISCHARGE SUMMARY  Visits from Start of Care: 16  Current functional level related to goals / functional outcomes: Pt has met goals for functional communication   Remaining deficits: Pt continues to demonstrate intermittent word finding deficits but not uncommon for someone of her age. She is able to compensate well when she has difficulty finding a word and is able to communicate well for ADL's.   Education / Equipment: Encouraged pt to continue HEP; I.e. Word finding activities and games Plan: Patient agrees to discharge.  Patient goals were met. Patient is  being discharged due to meeting the stated rehab goals.  ?????      L[place],Pairlee [place] 04/16/2016, 12:16 PM  [place] MAIN [facility] SERVICES [address] [place], [place], [address] Phone: [phone]   Fax:  [phone]   Name: Lisa [name] MRN: [id number] Date of Birth: 04-01-1927

## 2016-04-16 NOTE — Therapy (Signed)
[place] MAIN [facility] SERVICES [address] [place], [place], [address] Phone: [phone]   Fax:  [phone]  Physical Therapy Treatment  Patient Details  Name: Lisa [name] MRN: [id number] Date of Birth: 1927/03/07 Referring Provider: Alger Simons T  Encounter Date: 04/27/2016      PT End of Session - Apr 27, 2016 0922    Visit Number 14   Number of Visits 17   Date for PT Re-Evaluation 2016/04/27   Authorization Type g codes   PT Start Time 0915   PT Stop Time 1000   PT Time Calculation (min) 45 min   Equipment Utilized During Treatment Gait belt   Activity Tolerance Patient tolerated treatment well   Behavior During Therapy [facility] for tasks assessed/performed      Past Medical History:  Diagnosis Date  . Allergy   . Brain bleed ([place])   . Chronic systolic CHF (congestive heart failure) ([place])   . Depression   . Foot fracture    bilateral  . Hypertension   . Osteoarthritis   . Pelvis fracture ([place])   . Renal disorder   . Spinal stenosis   . V-tach ([place])   . Wrist fracture, right     Past Surgical History:  Procedure Laterality Date  . BACK SURGERY N/A   . BREAST LUMPECTOMY N/A     There were no vitals filed for this visit.      Subjective Assessment - April 27, 2016 0921    Subjective Patient is doing well today. Patient is doing her HEP.    Pertinent History anxiety, high blood pressure, heart disease, Osteoporosis   Limitations Walking;Standing   How long can you stand comfortably? Patient can stand for under 30 minutes   Patient Stated Goals to walk safely   Currently in Pain? No/denies   Pain Score 0-No pain     Therapeutic exercise and NMR: Nu-step x 5 minutes TM elevation 1 at 1.0 miles /hour Step ups x 20 Side stepping with blue foam balance beam x 20  Parallel bars on blue foam and horizontal UE with ball Toe tapping on blue foam  Patient needs occasional verbal cueing to improve posture and cueing to  correctly perform exercises slowly, holding at end of range to increase motor firing of desired muscle to encourage fatigue.                             PT Education - 2016-04-27 [phone]    Education provided Yes   Education Details Reviewed POC   Person(s) Educated Patient   Methods Explanation   Comprehension Verbalized understanding             PT Long Term Goals - 03/24/16 1149      PT LONG TERM GOAL #1   Title Patient will be independent in home exercise program to improve strength/mobility for better functional independence with ADLs.   Time 8   Period Weeks   Status Partially Met     PT LONG TERM GOAL #2   Title Patient (> 47 years old) will complete five times sit to stand test in < 15 seconds indicating an increased LE strength and improved balance.   Time 8   Period Weeks   Status Partially Met     PT LONG TERM GOAL #3   Title Patient will increase six minute walk test distance to >1000 for progression to community ambulator and improve gait ability  Time 8   Period Weeks   Status Partially Met     PT LONG TERM GOAL #4   Title Patient will reduce timed up and go to <11 seconds to reduce fall risk and demonstrate improved transfer/gait ability.   Time 8   Period Weeks   Status Partially Met               Plan - 04/16/16 0923    Clinical Impression Statement Patinet is ambulating with RW and needs cuing for safety wiht hand placement for transfers. She performs LE exercises and standing balance exercises without pain behaviors and with some minimal rest periods.    Rehab Potential Fair   PT Frequency 2x / week   PT Duration 8 weeks   PT Treatment/Interventions Therapeutic exercise;Balance training;Therapeutic activities;Gait training   PT Next Visit Plan progress strengthening and gait training and balance training   PT Home Exercise Plan hip abd with YTB hip flex with YTB   Consulted and Agree with Plan of Care Patient;Other  (Comment)  care giver      Patient will benefit from skilled therapeutic intervention in order to improve the following deficits and impairments:  Impaired flexibility, Difficulty walking, Decreased safety awareness, Decreased balance, Decreased activity tolerance, Decreased mobility, Pain  Visit Diagnosis: Unsteadiness on feet  Difficulty in walking, not elsewhere classified     Problem List Patient Active Problem List   Diagnosis Date Noted  . New onset atrial fibrillation ([place]) 12/11/2015  . Altered mental state   . Congestive dilated cardiomyopathy ([place])   . Dementia   . Headache 12/10/2015  . TIA (transient ischemic attack) 12/10/2015  . Influenza A 11/21/2015  . Ventricular tachycardia ([place]) 11/09/2015  . Constipation 09/12/2015  . Insomnia due to anxiety and fear 09/12/2015  . Depression, controlled 09/12/2015  . Bacterial UTI 08/27/2015  . DVT, lower extremity ([place]) 08/27/2015  . TBI (traumatic brain injury) ([place]) 08/22/2015  . ICH (intracerebral hemorrhage) ([place])   . Abscess   . Hypomagnesemia   . SDH (subdural hematoma) ([place])   . Boil of upper extremity   . Pneumonia due to Haemophilus influenzae ([place])   . Chronic systolic CHF (congestive heart failure) ([place])   . Acute on chronic renal failure ([place])   . Hypokalemia   . Bright red blood per rectum   . Dysphagia   . Pressure ulcer 08/16/2015  . Acute respiratory failure with hypoxemia ([place])   . Hypernatremia   . Subdural hematoma ([place]) 08/03/2015  . Lumbar radiculopathy 06/06/2015  . Spinal stenosis, lumbar region, with neurogenic claudication 04/02/2015  . Degenerative lumbar disc 02/15/2015  . Lumbar post-laminectomy syndrome 02/15/2015  . Facet syndrome, lumbar 02/15/2015  . Sacroiliac joint dysfunction 02/15/2015   Alanson Puls, PT, DPT [place] 04/16/2016, 9:27 AM  [place] MAIN [facility] SERVICES [address] [place], [place],  [address] Phone: [phone]   Fax:  [phone]  Name: Lisa [name] MRN: [id number] Date of Birth: [birth year]

## 2016-04-21 ENCOUNTER — Ambulatory Visit: Payer: Medicare Other | Admitting: Physical Therapy

## 2016-04-21 ENCOUNTER — Encounter: Payer: Self-pay | Admitting: Physical Therapy

## 2016-04-21 ENCOUNTER — Encounter: Payer: Medicare Other | Admitting: Speech Pathology

## 2016-04-21 DIAGNOSIS — R262 Difficulty in walking, not elsewhere classified: Secondary | ICD-10-CM

## 2016-04-21 DIAGNOSIS — R2681 Unsteadiness on feet: Secondary | ICD-10-CM

## 2016-04-21 DIAGNOSIS — R41841 Cognitive communication deficit: Secondary | ICD-10-CM | POA: Diagnosis not present

## 2016-04-21 NOTE — Therapy (Signed)
[place] MAIN [facility] SERVICES [address] [place], [place], [address] Phone: [phone]   Fax:  [phone]  Physical Therapy Treatment  Patient Details  Name: Lisa [name] MRN: [id number] Date of Birth: 1926-10-11 Referring Provider: Alger Simons T  Encounter Date: 04/21/2016      PT End of Session - 04/21/16 0925    Visit Number 15   Number of Visits 17   Date for PT Re-Evaluation 2016/05/09   Authorization Type g codes   PT Start Time 0918   PT Stop Time 0957   PT Time Calculation (min) 39 min   Equipment Utilized During Treatment Gait belt   Activity Tolerance Patient tolerated treatment well   Behavior During Therapy [facility] for tasks assessed/performed      Past Medical History:  Diagnosis Date  . Allergy   . Brain bleed ([place])   . Chronic systolic CHF (congestive heart failure) ([place])   . Depression   . Foot fracture    bilateral  . Hypertension   . Osteoarthritis   . Pelvis fracture ([place])   . Renal disorder   . Spinal stenosis   . V-tach ([place])   . Wrist fracture, right     Past Surgical History:  Procedure Laterality Date  . BACK SURGERY N/A   . BREAST LUMPECTOMY N/A     There were no vitals filed for this visit.      Subjective Assessment - 04/21/16 0922    Subjective Pt reports she is doing well. She has not had any falls. Sleeping is improved.   Pertinent History anxiety, high blood pressure, heart disease, Osteoporosis   Limitations Walking;Standing   How long can you stand comfortably? Patient can stand for under 30 minutes   Patient Stated Goals to walk safely   Currently in Pain? No/denies      Therex:  TM x6 minutes, incline 1 at 1.5 miles /hour Cues for heel strike and postural correction for improved gait mechanics. She became fatigued the last 2 minutes with some SOB. Seated rest break for energy conservation.  Quantum leg press 100# 20 x 3  Standing exercises with GTB BLE : SLR 2 x  15; Hip abduction 2 x 15; Hip extension 2 x 15; Hip flexion 2 x 15;  Heel raises 2 x 20; Sit to stand without UE support 2 x 10;   Mod cues for proper technique of exercises to target specific muscles and slow eccentric contractions for most effective strengthening.                                 PT Long Term Goals - 03/24/16 1149      PT LONG TERM GOAL #1   Title Patient will be independent in home exercise program to improve strength/mobility for better functional independence with ADLs.   Time 8   Period Weeks   Status Partially Met     PT LONG TERM GOAL #2   Title Patient (> 56 years old) will complete five times sit to stand test in < 15 seconds indicating an increased LE strength and improved balance.   Time 8   Period Weeks   Status Partially Met     PT LONG TERM GOAL #3   Title Patient will increase six minute walk test distance to >1000 for progression to community ambulator and improve gait ability   Time 8   Period  Weeks   Status Partially Met     PT LONG TERM GOAL #4   Title Patient will reduce timed up and go to <11 seconds to reduce fall risk and demonstrate improved transfer/gait ability.   Time 8   Period Weeks   Status Partially Met               Plan - 04/21/16 0937    Clinical Impression Statement Pt was able to perform LE strengthening exercises with increased intensity and repetitions this session, indicating improving strength. She does become fatigued and requires occasional seated rest breaks for energy conservation. Pt will benefit from continued skilled PT to further progress towards functional goals.   Rehab Potential Fair   PT Frequency 2x / week   PT Duration 8 weeks   PT Treatment/Interventions Therapeutic exercise;Balance training;Therapeutic activities;Gait training   PT Next Visit Plan strengthen, endurance, balance training   PT Home Exercise Plan hip abd with YTB hip flex with YTB   Consulted and  Agree with Plan of Care Patient;Other (Comment)  care giver      Patient will benefit from skilled therapeutic intervention in order to improve the following deficits and impairments:  Impaired flexibility, Difficulty walking, Decreased safety awareness, Decreased balance, Decreased activity tolerance, Decreased mobility, Pain  Visit Diagnosis: Unsteadiness on feet  Difficulty in walking, not elsewhere classified     Problem List Patient Active Problem List   Diagnosis Date Noted  . New onset atrial fibrillation ([place]) 12/11/2015  . Altered mental state   . Congestive dilated cardiomyopathy ([place])   . Dementia   . Headache 12/10/2015  . TIA (transient ischemic attack) 12/10/2015  . Influenza A 11/21/2015  . Ventricular tachycardia ([place]) 11/09/2015  . Constipation 09/12/2015  . Insomnia due to anxiety and fear 09/12/2015  . Depression, controlled 09/12/2015  . Bacterial UTI 08/27/2015  . DVT, lower extremity ([place]) 08/27/2015  . TBI (traumatic brain injury) ([place]) 08/22/2015  . ICH (intracerebral hemorrhage) ([place])   . Abscess   . Hypomagnesemia   . SDH (subdural hematoma) ([place])   . Boil of upper extremity   . Pneumonia due to Haemophilus influenzae ([place])   . Chronic systolic CHF (congestive heart failure) ([place])   . Acute on chronic renal failure ([place])   . Hypokalemia   . Bright red blood per rectum   . Dysphagia   . Pressure ulcer 08/16/2015  . Acute respiratory failure with hypoxemia ([place])   . Hypernatremia   . Subdural hematoma ([place]) 08/03/2015  . Lumbar radiculopathy 06/06/2015  . Spinal stenosis, lumbar region, with neurogenic claudication 04/02/2015  . Degenerative lumbar disc 02/15/2015  . Lumbar post-laminectomy syndrome 02/15/2015  . Facet syndrome, lumbar 02/15/2015  . Sacroiliac joint dysfunction 02/15/2015    Neoma Laming, PT, DPT  04/21/16, 9:58 AM [phone]  [place] MAIN [facility] SERVICES [address]  [place], [place], [address] Phone: [phone]   Fax:  [phone]  Name: Lisa [name] MRN: [id number] Date of Birth: 02/04/1927

## 2016-04-23 ENCOUNTER — Ambulatory Visit: Payer: Medicare Other | Attending: Physical Medicine & Rehabilitation | Admitting: Physical Therapy

## 2016-04-23 ENCOUNTER — Encounter: Payer: Medicare Other | Admitting: Speech Pathology

## 2016-04-23 ENCOUNTER — Encounter: Payer: Self-pay | Admitting: Physical Therapy

## 2016-04-23 DIAGNOSIS — R2681 Unsteadiness on feet: Secondary | ICD-10-CM | POA: Diagnosis present

## 2016-04-23 DIAGNOSIS — R262 Difficulty in walking, not elsewhere classified: Secondary | ICD-10-CM | POA: Insufficient documentation

## 2016-04-23 NOTE — Therapy (Signed)
[place] MAIN [facility] SERVICES [address] [place], [place], [address] Phone: [phone]   Fax:  [phone]  Physical Therapy Treatment/ Progress Note  Patient Details  Name: Lisa [name] MRN: [id number] Date of Birth: 1927/04/25 Referring Provider: Alger Simons T  Encounter Date: 04/23/2016      PT End of Session - 04/23/16 0922    Visit Number 16   Number of Visits 17   Date for PT Re-Evaluation Apr 24, 2016   Authorization Type g codes   PT Start Time 0910   PT Stop Time 0951   PT Time Calculation (min) 41 min   Equipment Utilized During Treatment Gait belt   Activity Tolerance Patient tolerated treatment well   Behavior During Therapy [facility] for tasks assessed/performed      Past Medical History:  Diagnosis Date  . Allergy   . Brain bleed ([place])   . Chronic systolic CHF (congestive heart failure) ([place])   . Depression   . Foot fracture    bilateral  . Hypertension   . Osteoarthritis   . Pelvis fracture ([place])   . Renal disorder   . Spinal stenosis   . V-tach ([place])   . Wrist fracture, right     Past Surgical History:  Procedure Laterality Date  . BACK SURGERY N/A   . BREAST LUMPECTOMY N/A     There were no vitals filed for this visit.      Subjective Assessment - 04/23/16 0914    Subjective Pt is doing well. She has been freezing fruits and vegetables from the farmer's market.   Pertinent History anxiety, high blood pressure, heart disease, Osteoporosis   Limitations Walking;Standing   How long can you stand comfortably? Patient can stand for under 30 minutes   Patient Stated Goals to walk safely   Currently in Pain? No/denies      Therex:   TM x6 minutes, incline 1 at 1.5 miles /hour Cues for heel strike and postural correction for improved gait mechanics. She became fatigued the last 2 minutes with some SOB. Seated rest break for energy conservation.    OUTCOME MEASURES: TEST  Outcome  Interpretation   5  times sit<>stand  17.07 sec  >60 yo, >15 sec indicates increased risk for falls   10 meter walk test    . 82           m/s  <1.0 m/s indicates increased risk for falls; limited community ambulator   Timed up and Go     15.33           sec  <14 sec indicates increased risk for falls   6 minute walk test     950        Feet  1000 feet is community ambulator    Goals reassessed this session to determine pt's progress. She improved from 500 ft to 950 ft on 6 minute walk test. She slightly improved on 5x STS form 17.61 sec to 17.07 sec. She improved TUG from 20.41 sec to 15.33 sec without FWW. 10 meter walk test improved from 0.69 m/s to 0.82 m/s.    Quantum leg press 100# 20 x 3    Mod cues for proper technique of exercises to target specific muscles and slow eccentric contractions for most effective strengthening. Mod cues during ambulation for upright posture, heel to toe and staying close to FWW.  PT Education - 2016/05/19 [phone]    Education provided Yes   Education Details continue HEP, POC, progress towards goals   Person(s) Educated Patient   Methods Explanation   Comprehension Verbalized understanding             PT Long Term Goals - 05/19/2016 0947      PT LONG TERM GOAL #1   Title Patient will be independent in home exercise program to improve strength/mobility for better functional independence with ADLs.   Time 8   Period Weeks   Status Achieved     PT LONG TERM GOAL #2   Title Patient (> 80 years old) will complete five times sit to stand test in < 15 seconds indicating an increased LE strength and improved balance.   Baseline 17.07 sec (May 20, 2023)   Time 8   Period Weeks   Status Partially Met     PT LONG TERM GOAL #3   Title Patient will increase six minute walk test distance to >1000 for progression to community ambulator and improve gait ability   Baseline 950 ft (8/2)   Time 8   Period Weeks   Status Partially Met      PT LONG TERM GOAL #4   Title Patient will reduce timed up and go to <11 seconds to reduce fall risk and demonstrate improved transfer/gait ability.   Baseline 15.33 sec (May 20, 2023)   Time 8   Period Weeks   Status Partially Met               Plan - 19-May-2016 0945    Clinical Impression Statement Pt is making progress towards all functional goals. Goals reassessed this session to determine pt's progress. She improved from 500 ft to 950 ft on 6 minute walk test. She slightly improved on 5x STS form 17.61 sec to 17.07 sec. She improved TUG from 20.41 sec to 15.33 sec without FWW. 10 meter walk test improved from 0.69 m/s to 0.82 m/s. She continues to require cues during ambulation for upright posture, staying close to FWW and heel to toe. Pt will benefit from continued skilled PT to address higher level balance and mobility to further progress towards goals.   Rehab Potential Fair   PT Frequency 2x / week   PT Duration 8 weeks   PT Treatment/Interventions Therapeutic exercise;Balance training;Therapeutic activities;Gait training   PT Next Visit Plan strengthen, endurance, balance training   PT Home Exercise Plan hip abd with YTB hip flex with YTB   Consulted and Agree with Plan of Care Patient;Other (Comment)  care giver      Patient will benefit from skilled therapeutic intervention in order to improve the following deficits and impairments:  Impaired flexibility, Difficulty walking, Decreased safety awareness, Decreased balance, Decreased activity tolerance, Decreased mobility, Pain  Visit Diagnosis: Unsteadiness on feet  Difficulty in walking, not elsewhere classified       G-Codes - 19-May-2016 0949    Functional Assessment Tool Used 5 x sit to stand, TUG, 6 MW, 10 MW   Functional Limitation Mobility: Walking and moving around   Mobility: Walking and Moving Around Current Status [phone]) At least 20 percent but less than 40 percent impaired, limited or restricted   Mobility: Walking  and Moving Around Goal Status [phone]) At least 1 percent but less than 20 percent impaired, limited or restricted      Problem List Patient Active Problem List   Diagnosis Date Noted  . New onset atrial fibrillation ([place]) 12/11/2015  .  Altered mental state   . Congestive dilated cardiomyopathy ([place])   . Dementia   . Headache 12/10/2015  . TIA (transient ischemic attack) 12/10/2015  . Influenza A 11/21/2015  . Ventricular tachycardia ([place]) 11/09/2015  . Constipation 09/12/2015  . Insomnia due to anxiety and fear 09/12/2015  . Depression, controlled 09/12/2015  . Bacterial UTI 08/27/2015  . DVT, lower extremity ([place]) 08/27/2015  . TBI (traumatic brain injury) ([place]) 08/22/2015  . ICH (intracerebral hemorrhage) ([place])   . Abscess   . Hypomagnesemia   . SDH (subdural hematoma) ([place])   . Boil of upper extremity   . Pneumonia due to Haemophilus influenzae ([place])   . Chronic systolic CHF (congestive heart failure) ([place])   . Acute on chronic renal failure ([place])   . Hypokalemia   . Bright red blood per rectum   . Dysphagia   . Pressure ulcer 08/16/2015  . Acute respiratory failure with hypoxemia ([place])   . Hypernatremia   . Subdural hematoma ([place]) 08/03/2015  . Lumbar radiculopathy 06/06/2015  . Spinal stenosis, lumbar region, with neurogenic claudication 04/02/2015  . Degenerative lumbar disc 02/15/2015  . Lumbar post-laminectomy syndrome 02/15/2015  . Facet syndrome, lumbar 02/15/2015  . Sacroiliac joint dysfunction 02/15/2015    Neoma Laming, PT, DPT  04/23/16, 9:54 AM [phone]  [place] MAIN [facility] SERVICES [address] [place], [place], [address] Phone: [phone]   Fax:  [phone]  Name: DONNE ROBILLARD MRN: [id number] Date of Birth: 1927-06-25

## 2016-04-29 ENCOUNTER — Ambulatory Visit: Payer: Medicare Other | Admitting: Physical Therapy

## 2016-04-29 ENCOUNTER — Encounter: Payer: Self-pay | Admitting: Physical Therapy

## 2016-04-29 ENCOUNTER — Encounter: Payer: Medicare Other | Admitting: Speech Pathology

## 2016-04-29 DIAGNOSIS — R262 Difficulty in walking, not elsewhere classified: Secondary | ICD-10-CM

## 2016-04-29 DIAGNOSIS — R2681 Unsteadiness on feet: Secondary | ICD-10-CM | POA: Diagnosis not present

## 2016-04-29 NOTE — Therapy (Signed)
[place] MAIN [facility] SERVICES [address] [place], [place], [address] Phone: [phone]   Fax:  [phone]  Physical Therapy Treatment  Patient Details  Name: Lisa [name] MRN: [id number] Date of Birth: 10/12/1926 Referring Provider: Alger Simons T  Encounter Date: 04/29/2016      PT End of Session - 04/29/16 0958    Visit Number 17   Number of Visits 17   Date for PT Re-Evaluation 2016-04-27   Authorization Type g codes 7   PT Start Time 1000   PT Stop Time 1040   PT Time Calculation (min) 40 min   Equipment Utilized During Treatment Gait belt   Activity Tolerance Patient tolerated treatment well   Behavior During Therapy [facility] for tasks assessed/performed      Past Medical History:  Diagnosis Date  . Allergy   . Brain bleed ([place])   . Chronic systolic CHF (congestive heart failure) ([place])   . Depression   . Foot fracture    bilateral  . Hypertension   . Osteoarthritis   . Pelvis fracture ([place])   . Renal disorder   . Spinal stenosis   . V-tach ([place])   . Wrist fracture, right     Past Surgical History:  Procedure Laterality Date  . BACK SURGERY N/A   . BREAST LUMPECTOMY N/A     There were no vitals filed for this visit.      Subjective Assessment - 04/29/16 0956    Subjective Pt is doing well. She has been freezing fruits and vegetables from the farmer's market.   Pertinent History anxiety, high blood pressure, heart disease, Osteoporosis   Limitations Walking;Standing   How long can you stand comfortably? Patient can stand for under 30 minutes   Patient Stated Goals to walk safely   Currently in Pain? No/denies   Pain Score 0-No pain        NEUROMUSCULAR RE-EDUCATION Airex NBOS eyes open/closed x 30 seconds each; Airex NBOS eyes open horizontal and vertical head turns x 30 seconds; Airex cone  reaching crossing midline  Toe tapping 6 inch stool without UE assist Tandem gait in // bars x 4 laps  Side  stepping on blue  foam balance beam x 5 lengths of the parallel bars  Standing on foam NBOS reaching  Therapeutic exercise; Leg press x 20 x 3 130 lbs,  heel raises with 90 lbs x 20 x 3 Heel raises standing  4 way hip RTB x 20 Eccentric step downs x 10 BLE x 2 sets CGA and Min to mod verbal cues used throughout with increased in postural sway and LOB most seen with narrow base of support and while on uneven surfaces. Continues to have balance deficits typical with diagnosis. Patient performs intermediate level exercises without pain behaviors and needs verbal cuing for postural alignment and head positioning.                          PT Education - 04/29/16 [phone]    Education provided Yes   Education Details continue with HEP and poc    Person(s) Educated Patient   Methods Explanation;Demonstration   Comprehension Verbalized understanding;Returned demonstration             PT Long Term Goals - 04/23/16 0947      PT LONG TERM GOAL #1   Title Patient will be independent in home exercise program to improve strength/mobility for better functional independence  with ADLs.   Time 8   Period Weeks   Status Achieved     PT LONG TERM GOAL #2   Title Patient (> 82 years old) will complete five times sit to stand test in < 15 seconds indicating an increased LE strength and improved balance.   Baseline 17.07 sec (8/2)   Time 8   Period Weeks   Status Partially Met     PT LONG TERM GOAL #3   Title Patient will increase six minute walk test distance to >1000 for progression to community ambulator and improve gait ability   Baseline 950 ft (8/2)   Time 8   Period Weeks   Status Partially Met     PT LONG TERM GOAL #4   Title Patient will reduce timed up and go to <11 seconds to reduce fall risk and demonstrate improved transfer/gait ability.   Baseline 15.33 sec (8/2)   Time 8   Period Weeks   Status Partially Met               Plan - 04/29/16 0959     Clinical Impression Statement  Muscle fatigue but no major pain complaints. Patient advancing to red theraband for exercises listed above. Patient has trunk weakness and has difficulty standing up straight to perform exercises.   Rehab Potential Fair   PT Frequency 2x / week   PT Duration 8 weeks   PT Treatment/Interventions Therapeutic exercise;Balance training;Therapeutic activities;Gait training   PT Next Visit Plan strengthen, endurance, balance training   PT Home Exercise Plan hip abd with YTB hip flex with YTB   Consulted and Agree with Plan of Care Patient;Other (Comment)  care giver      Patient will benefit from skilled therapeutic intervention in order to improve the following deficits and impairments:  Impaired flexibility, Difficulty walking, Decreased safety awareness, Decreased balance, Decreased activity tolerance, Decreased mobility, Pain  Visit Diagnosis: Unsteadiness on feet  Difficulty in walking, not elsewhere classified     Problem List Patient Active Problem List   Diagnosis Date Noted  . New onset atrial fibrillation ([place]) 12/11/2015  . Altered mental state   . Congestive dilated cardiomyopathy ([place])   . Dementia   . Headache 12/10/2015  . TIA (transient ischemic attack) 12/10/2015  . Influenza A 11/21/2015  . Ventricular tachycardia ([place]) 11/09/2015  . Constipation 09/12/2015  . Insomnia due to anxiety and fear 09/12/2015  . Depression, controlled 09/12/2015  . Bacterial UTI 08/27/2015  . DVT, lower extremity ([place]) 08/27/2015  . TBI (traumatic brain injury) ([place]) 08/22/2015  . ICH (intracerebral hemorrhage) ([place])   . Abscess   . Hypomagnesemia   . SDH (subdural hematoma) ([place])   . Boil of upper extremity   . Pneumonia due to Haemophilus influenzae ([place])   . Chronic systolic CHF (congestive heart failure) ([place])   . Acute on chronic renal failure ([place])   . Hypokalemia   . Bright red blood per rectum   . Dysphagia   . Pressure ulcer 08/16/2015   . Acute respiratory failure with hypoxemia ([place])   . Hypernatremia   . Subdural hematoma ([place]) 08/03/2015  . Lumbar radiculopathy 06/06/2015  . Spinal stenosis, lumbar region, with neurogenic claudication 04/02/2015  . Degenerative lumbar disc 02/15/2015  . Lumbar post-laminectomy syndrome 02/15/2015  . Facet syndrome, lumbar 02/15/2015  . Sacroiliac joint dysfunction 02/15/2015   Alanson Puls, PT, DPT [place], Minette Headland S 04/29/2016, 10:04 AM  [place] MAIN [facility] SERVICES  [place], [place], [address] Phone: [phone]   Fax:  [phone]  Name: Lisa [name] MRN: [id number] Date of Birth: [birth year]

## 2016-05-01 ENCOUNTER — Encounter: Payer: Medicare Other | Admitting: Speech Pathology

## 2016-05-01 ENCOUNTER — Ambulatory Visit: Payer: Medicare Other

## 2016-05-01 DIAGNOSIS — R262 Difficulty in walking, not elsewhere classified: Secondary | ICD-10-CM

## 2016-05-01 DIAGNOSIS — R2681 Unsteadiness on feet: Secondary | ICD-10-CM | POA: Diagnosis not present

## 2016-05-01 NOTE — Therapy (Signed)
[place] MAIN [facility] SERVICES [address] [place], [place], [address] Phone: [phone]   Fax:  [phone]  Physical Therapy Treatment  Patient Details  Name: Lisa [name] MRN: [id number] Date of Birth: 1926/11/01 Referring Provider: Alger Simons T  Encounter Date: 05/01/2016      PT End of Session - 05/01/16 1726    Visit Number 18   Number of Visits 17   Date for PT Re-Evaluation 2016-05-13   Authorization Type g codes 7   PT Start Time 12/12/1028   PT Stop Time 1110   PT Time Calculation (min) 40 min   Equipment Utilized During Treatment Gait belt   Activity Tolerance Patient tolerated treatment well   Behavior During Therapy [facility] for tasks assessed/performed      Past Medical History:  Diagnosis Date  . Allergy   . Brain bleed ([place])   . Chronic systolic CHF (congestive heart failure) ([place])   . Depression   . Foot fracture    bilateral  . Hypertension   . Osteoarthritis   . Pelvis fracture ([place])   . Renal disorder   . Spinal stenosis   . V-tach ([place])   . Wrist fracture, right     Past Surgical History:  Procedure Laterality Date  . BACK SURGERY N/A   . BREAST LUMPECTOMY N/A     There were no vitals filed for this visit.      Subjective Assessment - 05/01/16 1725    Subjective pt reports she has been doing good. no falls   Pertinent History anxiety, high blood pressure, heart disease, Osteoporosis   Limitations Walking;Standing   How long can you stand comfortably? Patient can stand for under 30 minutes   Patient Stated Goals to walk safely   Currently in Pain? No/denies        NMR: Side stepping no UE red band in // bars x 3 laps Monster walk with UE red band in // bars x 3 laps Sit to stand with band around knees with overhead ball lift  2lbs 2 x 10 Toe taps on cones standing on  AIREX x 3 min 1 foot on AIREX, 1 foot on step with balloon taps 30s x 3 each leg Standing on AIREX reaching outside BOS with  ball toss into basket x 6 min  pt requires CGA for safety on balance exercises                           PT Education - 05/01/16 1725    Education provided Yes   Education Details continue HEP as perscribed    Person(s) Educated Patient   Methods Explanation   Comprehension Verbalized understanding             PT Long Term Goals - 04/23/16 0947      PT LONG TERM GOAL #1   Title Patient will be independent in home exercise program to improve strength/mobility for better functional independence with ADLs.   Time 8   Period Weeks   Status Achieved     PT LONG TERM GOAL #2   Title Patient (> 80 years old) will complete five times sit to stand test in < 15 seconds indicating an increased LE strength and improved balance.   Baseline 17.07 sec (8/2)   Time 8   Period Weeks   Status Partially Met     PT LONG TERM GOAL #3   Title Patient  will increase six minute walk test distance to >1000 for progression to community ambulator and improve gait ability   Baseline 950 ft (8/2)   Time 8   Period Weeks   Status Partially Met     PT LONG TERM GOAL #4   Title Patient will reduce timed up and go to <11 seconds to reduce fall risk and demonstrate improved transfer/gait ability.   Baseline 15.33 sec (8/2)   Time 8   Period Weeks   Status Partially Met               Plan - 05/01/16 1727    Clinical Impression Statement pt did well with progression of balance activities which were more dynamic today and req. dual tasking. she does have less stability on the RLE. pt needs further PT serivces to maximzie function   Rehab Potential Fair   PT Frequency 2x / week   PT Duration 8 weeks   PT Treatment/Interventions Therapeutic exercise;Balance training;Therapeutic activities;Gait training   PT Next Visit Plan strengthen, endurance, balance training   PT Home Exercise Plan hip abd with YTB hip flex with YTB   Consulted and Agree with Plan of Care Patient;Other  (Comment)  care giver      Patient will benefit from skilled therapeutic intervention in order to improve the following deficits and impairments:  Impaired flexibility, Difficulty walking, Decreased safety awareness, Decreased balance, Decreased activity tolerance, Decreased mobility, Pain  Visit Diagnosis: Unsteadiness on feet  Difficulty in walking, not elsewhere classified     Problem List Patient Active Problem List   Diagnosis Date Noted  . New onset atrial fibrillation ([place]) 12/11/2015  . Altered mental state   . Congestive dilated cardiomyopathy ([place])   . Dementia   . Headache 12/10/2015  . TIA (transient ischemic attack) 12/10/2015  . Influenza A 11/21/2015  . Ventricular tachycardia ([place]) 11/09/2015  . Constipation 09/12/2015  . Insomnia due to anxiety and fear 09/12/2015  . Depression, controlled 09/12/2015  . Bacterial UTI 08/27/2015  . DVT, lower extremity ([place]) 08/27/2015  . TBI (traumatic brain injury) ([place]) 08/22/2015  . ICH (intracerebral hemorrhage) ([place])   . Abscess   . Hypomagnesemia   . SDH (subdural hematoma) ([place])   . Boil of upper extremity   . Pneumonia due to Haemophilus influenzae ([place])   . Chronic systolic CHF (congestive heart failure) ([place])   . Acute on chronic renal failure ([place])   . Hypokalemia   . Bright red blood per rectum   . Dysphagia   . Pressure ulcer 08/16/2015  . Acute respiratory failure with hypoxemia ([place])   . Hypernatremia   . Subdural hematoma ([place]) 08/03/2015  . Lumbar radiculopathy 06/06/2015  . Spinal stenosis, lumbar region, with neurogenic claudication 04/02/2015  . Degenerative lumbar disc 02/15/2015  . Lumbar post-laminectomy syndrome 02/15/2015  . Facet syndrome, lumbar 02/15/2015  . Sacroiliac joint dysfunction 02/15/2015   Gorden Harms. Lizett Chowning, PT, DPT [id number]  Jeanpaul Biehl 05/01/2016, 5:29 PM  [facility] MAIN [facility] SERVICES [address] [place], [place],  [address] Phone: [phone]   Fax:  [phone]  Name: CHERYLYNN LISZEWSKI MRN: [id number] Date of Birth: Dec 23, 1926

## 2016-05-05 ENCOUNTER — Ambulatory Visit: Payer: Medicare Other

## 2016-05-05 DIAGNOSIS — R2681 Unsteadiness on feet: Secondary | ICD-10-CM

## 2016-05-06 ENCOUNTER — Ambulatory Visit: Payer: Medicare Other | Admitting: Physical Therapy

## 2016-05-07 ENCOUNTER — Ambulatory Visit: Payer: Medicare Other

## 2016-05-07 VITALS — BP 166/52 | HR 66

## 2016-05-07 DIAGNOSIS — R2681 Unsteadiness on feet: Secondary | ICD-10-CM | POA: Diagnosis not present

## 2016-05-07 DIAGNOSIS — R262 Difficulty in walking, not elsewhere classified: Secondary | ICD-10-CM

## 2016-05-07 NOTE — Therapy (Signed)
[place] MAIN [facility] SERVICES [address] [place], [place], [address] Phone: [phone]   Fax:  [phone]  Physical Therapy Treatment  Patient Details  Name: Lisa [name] MRN: [id number] Date of Birth: February 28, 1927 Referring Provider: Alger Simons T  Encounter Date: 05/07/2016      PT End of Session - 05/07/16 1136    Visit Number 19   Number of Visits 17   Date for PT Re-Evaluation 06/25/2016   Authorization Type g codes 8   PT Start Time 1130   PT Stop Time 1210   PT Time Calculation (min) 40 min   Equipment Utilized During Treatment Gait belt   Activity Tolerance Patient tolerated treatment well   Behavior During Therapy [facility] for tasks assessed/performed      Past Medical History:  Diagnosis Date  . Allergy   . Brain bleed ([place])   . Chronic systolic CHF (congestive heart failure) ([place])   . Depression   . Foot fracture    bilateral  . Hypertension   . Osteoarthritis   . Pelvis fracture ([place])   . Renal disorder   . Spinal stenosis   . V-tach ([place])   . Wrist fracture, right     Past Surgical History:  Procedure Laterality Date  . BACK SURGERY N/A   . BREAST LUMPECTOMY N/A     Vitals:   05/07/16 1137  BP: (!) 166/52  Pulse: 66  SpO2: 98%        Subjective Assessment - 05/07/16 1135    Subjective Pt is doing well today. No falls since last therapy session. She is performing HEP without issue. No specific questions or concerns currently.    Pertinent History anxiety, high blood pressure, heart disease, Osteoporosis   Limitations Walking;Standing   How long can you stand comfortably? Patient can stand for under 30 minutes   Patient Stated Goals to walk safely   Currently in Pain? No/denies        TREATMENT  THER-EX Sit to stand with band around knees with overhead ball lift  2kg 2 x 10 Forward BOSU lunges without UE support alternating LEs 2 x 10; Lateral BOSU lunges in // bars x 10 each direction;    NMR Toe taps on BOSU x 2 minutes, standing on Airex x 2 minutes; Standing on AIREX reaching outside BOS with ball toss into basket x 6 min Feet together on Airex with horizontal and vertical head turns x 1 minutes each; Feet together on Airex with horizontal head with body turns x 1 minute; Step-ups to 6" step alternating LE x 10 each; Pt requires CGA/minA for safety on balance exercises                          PT Education - 05/07/16 1136    Education provided Yes   Education Details Reinforced HEP   Person(s) Educated Patient   Methods Explanation   Comprehension Verbalized understanding             PT Long Term Goals - 04/23/16 0947      PT LONG TERM GOAL #1   Title Patient will be independent in home exercise program to improve strength/mobility for better functional independence with ADLs.   Time 8   Period Weeks   Status Achieved     PT LONG TERM GOAL #2   Title Patient (> 57 years old) will complete five times sit to stand test in <  15 seconds indicating an increased LE strength and improved balance.   Baseline 17.07 sec (8/2)   Time 8   Period Weeks   Status Partially Met     PT LONG TERM GOAL #3   Title Patient will increase six minute walk test distance to >1000 for progression to community ambulator and improve gait ability   Baseline 950 ft (8/2)   Time 8   Period Weeks   Status Partially Met     PT LONG TERM GOAL #4   Title Patient will reduce timed up and go to <11 seconds to reduce fall risk and demonstrate improved transfer/gait ability.   Baseline 15.33 sec (8/2)   Time 8   Period Weeks   Status Partially Met               Plan - 05/07/16 1137    Clinical Impression Statement Pt demonstrates poor balance with single leg stance as well as on unstable surfaces. She loses balance consistently to the R with horizontal head turns while standing with feet together on Airex., Pt encouraged to continue HEP and follow-up as  scheduled.    Rehab Potential Fair   PT Frequency 2x / week   PT Duration 8 weeks   PT Treatment/Interventions Therapeutic exercise;Balance training;Therapeutic activities;Gait training   PT Next Visit Plan strengthen, endurance, balance training   PT Home Exercise Plan hip abd with YTB hip flex with YTB   Consulted and Agree with Plan of Care Patient;Other (Comment)  care giver      Patient will benefit from skilled therapeutic intervention in order to improve the following deficits and impairments:  Impaired flexibility, Difficulty walking, Decreased safety awareness, Decreased balance, Decreased activity tolerance, Decreased mobility, Pain  Visit Diagnosis: Unsteadiness on feet  Difficulty in walking, not elsewhere classified     Problem List Patient Active Problem List   Diagnosis Date Noted  . New onset atrial fibrillation ([place]) 12/11/2015  . Altered mental state   . Congestive dilated cardiomyopathy ([place])   . Dementia   . Headache 12/10/2015  . TIA (transient ischemic attack) 12/10/2015  . Influenza A 11/21/2015  . Ventricular tachycardia ([place]) 11/09/2015  . Constipation 09/12/2015  . Insomnia due to anxiety and fear 09/12/2015  . Depression, controlled 09/12/2015  . Bacterial UTI 08/27/2015  . DVT, lower extremity ([place]) 08/27/2015  . TBI (traumatic brain injury) ([place]) 08/22/2015  . ICH (intracerebral hemorrhage) ([place])   . Abscess   . Hypomagnesemia   . SDH (subdural hematoma) ([place])   . Boil of upper extremity   . Pneumonia due to Haemophilus influenzae ([place])   . Chronic systolic CHF (congestive heart failure) ([place])   . Acute on chronic renal failure ([place])   . Hypokalemia   . Bright red blood per rectum   . Dysphagia   . Pressure ulcer 08/16/2015  . Acute respiratory failure with hypoxemia ([place])   . Hypernatremia   . Subdural hematoma ([place]) 08/03/2015  . Lumbar radiculopathy 06/06/2015  . Spinal stenosis, lumbar region, with neurogenic claudication  04/02/2015  . Degenerative lumbar disc 02/15/2015  . Lumbar post-laminectomy syndrome 02/15/2015  . Facet syndrome, lumbar 02/15/2015  . Sacroiliac joint dysfunction 02/15/2015   Phillips Grout PT, DPT   Shavy Beachem 05/07/2016, 12:50 PM  [place] MAIN [facility] SERVICES [address] [place], [place], [address] Phone: [phone]   Fax:  [phone]  Name: Lisa [name] MRN: [id number] Date of Birth: August 10, 1927

## 2016-05-08 NOTE — Therapy (Signed)
[place] MAIN [facility] SERVICES [address] [place], [place], [address] Phone: [phone]   Fax:  [phone]  Physical Therapy Treatment  Patient Details  Name: Lisa [name] MRN: [id number] Date of Birth: [birth year] Referring Provider: Alger Simons T  Encounter Date: 05/05/2016      PT End of Session - 05/08/16 1356    Visit Number 20   Number of Visits 17   Date for PT Re-Evaluation 06/04/16   Authorization Type 9   PT Start Time 1135   PT Stop Time 1215   PT Time Calculation (min) 40 min   Equipment Utilized During Treatment Gait belt   Activity Tolerance Patient tolerated treatment well   Behavior During Therapy [facility] for tasks assessed/performed      Past Medical History:  Diagnosis Date  . Allergy   . Brain bleed ([place])   . Chronic systolic CHF (congestive heart failure) ([place])   . Depression   . Foot fracture    bilateral  . Hypertension   . Osteoarthritis   . Pelvis fracture ([place])   . Renal disorder   . Spinal stenosis   . V-tach ([place])   . Wrist fracture, right     Past Surgical History:  Procedure Laterality Date  . BACK SURGERY N/A   . BREAST LUMPECTOMY N/A     There were no vitals filed for this visit.      Subjective Assessment - 05/08/16 1353    Subjective pt has no specific concerns at this time. no new falls   Pertinent History anxiety, high blood pressure, heart disease, Osteoporosis   Limitations Walking;Standing   How long can you stand comfortably? Patient can stand for under 30 minutes   Patient Stated Goals to walk safely   Currently in Pain? No/denies         therex: Nustep L 3 x 4 min unbilled warm up Sit to stand with yellow ball overhead lift 2 x 10- green band around knees Standing resisted hip flexion, abduction , and extension with   green     Band x 10 each way  monster walk with green band x 3 laps in // bars NMR: Alt LE cone tap 3 x 10 with cues to weight shift Standing on  AIREX ball toss against mirror 2 x 15 AP/SS weigth shift on AIREX with cues for hip strategy during LOB x 5 min Side stepping on AIREX beam x 3 laps no UE  pt requires CGA for safety on balance exercises                         PT Education - 05/08/16 1355    Education provided Yes   Education Details use walker for safety   Person(s) Educated Patient   Methods Explanation             PT Long Term Goals - 04/23/16 0947      PT LONG TERM GOAL #1   Title Patient will be independent in home exercise program to improve strength/mobility for better functional independence with ADLs.   Time 8   Period Weeks   Status Achieved     PT LONG TERM GOAL #2   Title Patient (82 years old) will complete five times sit to stand test in < 15 seconds indicating an increased LE strength and improved balance.   Baseline 17.07 sec (8/2)   Time 8   Period Weeks  Status Partially Met     PT LONG TERM GOAL #3   Title Patient will increase six minute walk test distance to >1000 for progression to community ambulator and improve gait ability   Baseline 950 ft (8/2)   Time 8   Period Weeks   Status Partially Met     PT LONG TERM GOAL #4   Title Patient will reduce timed up and go to <11 seconds to reduce fall risk and demonstrate improved transfer/gait ability.   Baseline 15.33 sec (8/2)   Time 8   Period Weeks   Status Partially Met               Plan - 05/08/16 1356    Clinical Impression Statement pt demosntrates increased unsteadiness on the RLE vs LLE, but has good motivation to do exercises. pt improved SLS ability with cues to shift weight. pt needs furhter PT servies to progress strength, gait to [employer] function    Rehab Potential Fair   PT Frequency 2x / week   PT Duration 8 weeks   PT Treatment/Interventions Therapeutic exercise;Balance training;Therapeutic activities;Gait training   PT Next Visit Plan strengthen, endurance, balance training   PT  Home Exercise Plan hip abd with YTB hip flex with YTB   Consulted and Agree with Plan of Care Patient;Other (Comment)  care giver      Patient will benefit from skilled therapeutic intervention in order to improve the following deficits and impairments:  Impaired flexibility, Difficulty walking, Decreased safety awareness, Decreased balance, Decreased activity tolerance, Decreased mobility, Pain  Visit Diagnosis: Unsteadiness on feet     Problem List Patient Active Problem List   Diagnosis Date Noted  . New onset atrial fibrillation ([place]) 12/11/2015  . Altered mental state   . Congestive dilated cardiomyopathy ([place])   . Dementia   . Headache 12/10/2015  . TIA (transient ischemic attack) 12/10/2015  . Influenza A 11/21/2015  . Ventricular tachycardia ([place]) 11/09/2015  . Constipation 09/12/2015  . Insomnia due to anxiety and fear 09/12/2015  . Depression, controlled 09/12/2015  . Bacterial UTI 08/27/2015  . DVT, lower extremity ([place]) 08/27/2015  . TBI (traumatic brain injury) ([place]) 08/22/2015  . ICH (intracerebral hemorrhage) ([place])   . Abscess   . Hypomagnesemia   . SDH (subdural hematoma) ([place])   . Boil of upper extremity   . Pneumonia due to Haemophilus influenzae ([place])   . Chronic systolic CHF (congestive heart failure) ([place])   . Acute on chronic renal failure ([place])   . Hypokalemia   . Bright red blood per rectum   . Dysphagia   . Pressure ulcer 08/16/2015  . Acute respiratory failure with hypoxemia ([place])   . Hypernatremia   . Subdural hematoma ([place]) 08/03/2015  . Lumbar radiculopathy 06/06/2015  . Spinal stenosis, lumbar region, with neurogenic claudication 04/02/2015  . Degenerative lumbar disc 02/15/2015  . Lumbar post-laminectomy syndrome 02/15/2015  . Facet syndrome, lumbar 02/15/2015  . Sacroiliac joint dysfunction 02/15/2015   Gorden Harms. Erven Ramson, PT, DPT [phone]  Demon Volante 05/08/2016, 1:59 PM  [place] [facility] MAIN  [facility] SERVICES [address] [place], [place], [address] Phone: [phone]   Fax:  [phone]  Name: Lisa [name] MRN: [id number] Date of Birth: [birth year]

## 2016-05-09 ENCOUNTER — Encounter: Payer: Medicare Other | Admitting: Speech Pathology

## 2016-05-12 ENCOUNTER — Encounter: Payer: Self-pay | Admitting: Physical Therapy

## 2016-05-12 ENCOUNTER — Ambulatory Visit: Payer: Medicare Other | Admitting: Physical Therapy

## 2016-05-12 DIAGNOSIS — R262 Difficulty in walking, not elsewhere classified: Secondary | ICD-10-CM

## 2016-05-12 DIAGNOSIS — R2681 Unsteadiness on feet: Secondary | ICD-10-CM

## 2016-05-12 NOTE — Therapy (Signed)
[place] MAIN [facility] SERVICES [address] [place], [place], [address] Phone: [phone]   Fax:  [phone]  Physical Therapy Treatment  Patient Details  Name: Lisa [name] MRN: [id number] Date of Birth: July 10, 1927 Referring Provider: Alger Simons T  Encounter Date: 05/12/2016      PT End of Session - 05/12/16 1141    Visit Number 21   Number of Visits 17   Date for PT Re-Evaluation 06/04/16   Authorization Type 10   PT Start Time 1145   PT Stop Time 1225   PT Time Calculation (min) 40 min   Equipment Utilized During Treatment Gait belt   Activity Tolerance Patient tolerated treatment well   Behavior During Therapy [facility] for tasks assessed/performed      Past Medical History:  Diagnosis Date  . Allergy   . Brain bleed ([place])   . Chronic systolic CHF (congestive heart failure) ([place])   . Depression   . Foot fracture    bilateral  . Hypertension   . Osteoarthritis   . Pelvis fracture ([place])   . Renal disorder   . Spinal stenosis   . V-tach ([place])   . Wrist fracture, right     Past Surgical History:  Procedure Laterality Date  . BACK SURGERY N/A   . BREAST LUMPECTOMY N/A     There were no vitals filed for this visit.      Subjective Assessment - 05/12/16 1152    Subjective pt has no specific concerns at this time. no new falls. She is doing well today.   Pertinent History anxiety, high blood pressure, heart disease, Osteoporosis   Limitations Walking;Standing   How long can you stand comfortably? Patient can stand for under 30 minutes   Patient Stated Goals to walk safely     Therapeutic exercise: standing hip abd with YTB x 20  side stepping left and right in parallel bars 10 feet x 3 standing on blue foam with cone reaching x 20 across midline step ups from floor to 6 inch stool x 20 bilateral sit to stand x 10 marching in parallel bars x 20  Therapeutic activities: Outcome measures performed including : TUG,  5 x sit to stand, 6 MW  Patient is improving and decreasing her falls risk. Patient needs occasional verbal cueing to improve posture and cueing to correctly perform exercises slowly, holding at end of range to increase motor firing of desired muscle to encourage fatigue.                            PT Education - 05/12/16 1141    Education provided Yes   Education Details staying inside AD   Person(s) Educated Patient   Methods Explanation   Comprehension Verbalized understanding             PT Long Term Goals - 05/12/16 1153      PT LONG TERM GOAL #1   Title Patient will be independent in home exercise program to improve strength/mobility for better functional independence with ADLs.   Time 8   Period Weeks   Status Achieved     PT LONG TERM GOAL #2   Title Patient (> 33 years old) will complete five times sit to stand test in < 15 seconds indicating an increased LE strength and improved balance.   Baseline 17/15   Time 8   Period Weeks   Status Partially Met  PT LONG TERM GOAL #3   Title Patient will increase six minute walk test distance to >1000 for progression to community ambulator and improve gait ability   Baseline 955   Time 8   Period Weeks   Status Partially Met     PT LONG TERM GOAL #4   Title Patient will reduce timed up and go to <11 seconds to reduce fall risk and demonstrate improved transfer/gait ability.   Baseline 16.03   Time 8   Status Partially Met               Plan - 05-22-2016 1152    Clinical Impression Statement Patient is improving with outcome measures including TUG, 5 x sit to stand, 10 MW, 6 MW and her falls risk is decreasing.   Rehab Potential Good   PT Frequency 2x / week   PT Duration 8 weeks   PT Treatment/Interventions Therapeutic exercise;Balance training;Therapeutic activities;Gait training   PT Next Visit Plan strengthen, endurance, balance training   PT Home Exercise Plan hip abd with YTB  hip flex with YTB   Consulted and Agree with Plan of Care Patient;Other (Comment)      Patient will benefit from skilled therapeutic intervention in order to improve the following deficits and impairments:  Impaired flexibility, Difficulty walking, Decreased safety awareness, Decreased balance, Decreased activity tolerance, Decreased mobility, Pain  Visit Diagnosis: Unsteadiness on feet  Difficulty in walking, not elsewhere classified       G-Codes - 05/22/2016 1206    Functional Assessment Tool Used 5 x sit to stand, TUG, 6 MW, 10 MW   Functional Limitation Mobility: Walking and moving around   Mobility: Walking and Moving Around Current Status [phone]) At least 20 percent but less than 40 percent impaired, limited or restricted   Mobility: Walking and Moving Around Goal Status [phone]) At least 1 percent but less than 20 percent impaired, limited or restricted      Problem List Patient Active Problem List   Diagnosis Date Noted  . New onset atrial fibrillation ([place]) 12/11/2015  . Altered mental state   . Congestive dilated cardiomyopathy ([place])   . Dementia   . Headache 12/10/2015  . TIA (transient ischemic attack) 12/10/2015  . Influenza A 11/21/2015  . Ventricular tachycardia ([place]) 11/09/2015  . Constipation 09/12/2015  . Insomnia due to anxiety and fear 09/12/2015  . Depression, controlled 09/12/2015  . Bacterial UTI 08/27/2015  . DVT, lower extremity ([place]) 08/27/2015  . TBI (traumatic brain injury) ([place]) 08/22/2015  . ICH (intracerebral hemorrhage) ([place])   . Abscess   . Hypomagnesemia   . SDH (subdural hematoma) ([place])   . Boil of upper extremity   . Pneumonia due to Haemophilus influenzae ([place])   . Chronic systolic CHF (congestive heart failure) ([place])   . Acute on chronic renal failure ([place])   . Hypokalemia   . Bright red blood per rectum   . Dysphagia   . Pressure ulcer 08/16/2015  . Acute respiratory failure with hypoxemia ([place])   . Hypernatremia   . Subdural  hematoma ([place]) 08/03/2015  . Lumbar radiculopathy 06/06/2015  . Spinal stenosis, lumbar region, with neurogenic claudication 04/02/2015  . Degenerative lumbar disc 02/15/2015  . Lumbar post-laminectomy syndrome 02/15/2015  . Facet syndrome, lumbar 02/15/2015  . Sacroiliac joint dysfunction 02/15/2015   Alanson Puls, PT, DPT [place] 05/22/16, 12:07 PM  [place] MAIN [facility] SERVICES [address] [place], [place], [address] Phone: [phone]  Fax:  [phone]  Name: Lisa [name] MRN: [id number] Date of Birth: 04/11/1927

## 2016-05-14 ENCOUNTER — Ambulatory Visit: Payer: Medicare Other

## 2016-05-14 VITALS — BP 140/48 | HR 69

## 2016-05-14 DIAGNOSIS — R2681 Unsteadiness on feet: Secondary | ICD-10-CM | POA: Diagnosis not present

## 2016-05-14 DIAGNOSIS — R262 Difficulty in walking, not elsewhere classified: Secondary | ICD-10-CM

## 2016-05-14 NOTE — Therapy (Signed)
[place] MAIN [facility] SERVICES [address] [place], [place], [address] Phone: [phone]   Fax:  [phone]  Physical Therapy Treatment  Patient Details  Name: Lisa [name] MRN: [id number] Date of Birth: March 12, 1927 Referring Provider: Alger Simons T  Encounter Date: 05/14/2016      PT End of Session - 05/14/16 0929    Visit Number 22   Number of Visits 17   Date for PT Re-Evaluation 06/04/16   Authorization Type 11   PT Start Time 0930   PT Stop Time 1015   PT Time Calculation (min) 45 min   Equipment Utilized During Treatment Gait belt   Activity Tolerance Patient tolerated treatment well   Behavior During Therapy [facility] for tasks assessed/performed      Past Medical History:  Diagnosis Date  . Allergy   . Brain bleed ([place])   . Chronic systolic CHF (congestive heart failure) ([place])   . Depression   . Foot fracture    bilateral  . Hypertension   . Osteoarthritis   . Pelvis fracture ([place])   . Renal disorder   . Spinal stenosis   . V-tach ([place])   . Wrist fracture, right     Past Surgical History:  Procedure Laterality Date  . BACK SURGERY N/A   . BREAST LUMPECTOMY N/A     Vitals:   05/14/16 0934  BP: (!) 140/48  Pulse: 69  SpO2: 98%        Subjective Assessment - 05/14/16 0929    Subjective Pt reports she is doing well today. She took 2 Tylenol prior to coming to therapy. No specific questions or concerns currently.    Pertinent History anxiety, high blood pressure, heart disease, Osteoporosis   Limitations Walking;Standing   How long can you stand comfortably? Patient can stand for under 30 minutes   Patient Stated Goals to walk safely   Currently in Pain? No/denies        TREATMENT  THER-EX Sit to stand with Airex under feet and Airex on seat (to elevate) with overhead ball lift 2kg 2 x 10 Standing mini squats on Airex 2 x 10; Matrix resisted walking 7.5# x 3 backwards and side stepping each  direction; NuStep intervals L1 30s 50spm, 30s 80spm, L2 30s 50spm, 30s 80 spm,  L3 30s 50spm, 30s 80 spm, 1 minute cool down L0 50spm.  NMR 2"x4" tandem walking x 8 lengths, bilateral, unilateral, and finally no UE support; 2"x4" side stepping x 8 lengths no UE support; Standing on AIREX toe taps to 6" step no UE support x 10 each; Standing on AIREX step ups to 6" step with Airex on top, no UE support x 10 each; Standing on AIREX 4# weight ball passes around body with therapist x 10 each direction; Pt requires CGA/minA for safety on balance exercises   Cues for exercise technique, safety, and form. Pt reqiures CGA to minA+1 for balance.                         PT Education - 05/14/16 [phone]    Education provided Yes   Education Details Form and technique with exercise   Person(s) Educated Patient   Methods Explanation   Comprehension Verbalized understanding             PT Long Term Goals - 05/12/16 1153      PT LONG TERM GOAL #1   Title Patient will be independent in  home exercise program to improve strength/mobility for better functional independence with ADLs.   Time 8   Period Weeks   Status Achieved     PT LONG TERM GOAL #2   Title Patient (> 61 years old) will complete five times sit to stand test in < 15 seconds indicating an increased LE strength and improved balance.   Baseline 17/15   Time 8   Period Weeks   Status Partially Met     PT LONG TERM GOAL #3   Title Patient will increase six minute walk test distance to >1000 for progression to community ambulator and improve gait ability   Baseline 955   Time 8   Period Weeks   Status Partially Met     PT LONG TERM GOAL #4   Title Patient will reduce timed up and go to <11 seconds to reduce fall risk and demonstrate improved transfer/gait ability.   Baseline 16.03   Time 8   Status Partially Met               Plan - 05/14/16 0929    Clinical Impression Statement Pt demonstrate  improving strength today during exercise. She continues to demonstrate posterior LOB on foam surfaces especially with step-downs. Pt with intermittent LOB duirng resisted side stepping requiring minA+1 from therapist to prevent falls. Pt encouraged to continue HEP and follow-up as scheduled.    Rehab Potential Good   Clinical Impairments Affecting Rehab Potential Positive: motivation; Negative: chronic instability   PT Frequency 2x / week   PT Duration 8 weeks   PT Treatment/Interventions Therapeutic exercise;Balance training;Therapeutic activities;Gait training   PT Next Visit Plan strengthen, endurance, balance training   PT Home Exercise Plan hip abd with YTB hip flex with YTB   Consulted and Agree with Plan of Care Patient;Other (Comment)      Patient will benefit from skilled therapeutic intervention in order to improve the following deficits and impairments:  Impaired flexibility, Difficulty walking, Decreased safety awareness, Decreased balance, Decreased activity tolerance, Decreased mobility, Pain  Visit Diagnosis: Unsteadiness on feet  Difficulty in walking, not elsewhere classified     Problem List Patient Active Problem List   Diagnosis Date Noted  . New onset atrial fibrillation ([place]) 12/11/2015  . Altered mental state   . Congestive dilated cardiomyopathy ([place])   . Dementia   . Headache 12/10/2015  . TIA (transient ischemic attack) 12/10/2015  . Influenza A 11/21/2015  . Ventricular tachycardia ([place]) 11/09/2015  . Constipation 09/12/2015  . Insomnia due to anxiety and fear 09/12/2015  . Depression, controlled 09/12/2015  . Bacterial UTI 08/27/2015  . DVT, lower extremity ([place]) 08/27/2015  . TBI (traumatic brain injury) ([place]) 08/22/2015  . ICH (intracerebral hemorrhage) ([place])   . Abscess   . Hypomagnesemia   . SDH (subdural hematoma) ([place])   . Boil of upper extremity   . Pneumonia due to Haemophilus influenzae ([place])   . Chronic systolic CHF (congestive heart  failure) ([place])   . Acute on chronic renal failure ([place])   . Hypokalemia   . Bright red blood per rectum   . Dysphagia   . Pressure ulcer 08/16/2015  . Acute respiratory failure with hypoxemia ([place])   . Hypernatremia   . Subdural hematoma ([place]) 08/03/2015  . Lumbar radiculopathy 06/06/2015  . Spinal stenosis, lumbar region, with neurogenic claudication 04/02/2015  . Degenerative lumbar disc 02/15/2015  . Lumbar post-laminectomy syndrome 02/15/2015  . Facet syndrome, lumbar 02/15/2015  . Sacroiliac joint dysfunction 02/15/2015  Phillips Grout PT, DPT   Marlan Steward 05/14/2016, 10:46 AM  [place] MAIN [facility] SERVICES [address] [place], [place], [address] Phone: [phone]   Fax:  [phone]  Name: Lisa [name] MRN: [id number] Date of Birth: 01/31/1927

## 2016-05-16 ENCOUNTER — Encounter: Payer: Medicare Other | Admitting: Speech Pathology

## 2016-05-19 ENCOUNTER — Ambulatory Visit: Payer: Medicare Other | Admitting: Physical Therapy

## 2016-05-19 ENCOUNTER — Encounter: Payer: Self-pay | Admitting: Physical Therapy

## 2016-05-19 DIAGNOSIS — R262 Difficulty in walking, not elsewhere classified: Secondary | ICD-10-CM

## 2016-05-19 DIAGNOSIS — R2681 Unsteadiness on feet: Secondary | ICD-10-CM

## 2016-05-19 NOTE — Patient Instructions (Signed)
Bridge   Lie back, legs bent. Inhale, pressing hips up. Keeping ribs in, lengthen lower back. Exhale, rolling down along spine from top. Repeat ___20_ times. Do _2___ sessions per day.  Copyright  VHI. All rights reserved.   Pelvic Tilt   Flatten back by tightening stomach muscles and buttocks. Repeat __20_ times per set. Do _2___ sets per session. Do __2__ sessions per day.  http://orth.exer.us/134   Copyright  VHI. All rights reserved. Knee to Chest (Flexion)   Pull knee toward chest. Feel stretch in lower back or buttock area. Breathing deeply, Hold __30__ seconds. Repeat with other knee. Repeat 3____ times. Do _1___ sessions per day.  http://[email or web address].exer.us/225   Copyright  VHI. All rights reserved.   Lower Trunk Rotation Stretch   Keeping back flat and feet together, rotate knees to left side. Hold _3___ seconds. Repeat __10__ times per set. Do ___2_ sets per session. Do _1___ sessions per day.  http://orth.exer.us/122     Copyright  VHI. All rights reserved.  Standing Arch (Extension)   Place hands in small of back. Using hands as fulcrum, arch backward. Try to keep knees straight. Great exercise if sitting makes pain worse. Use to break up long periods of sitting. Repeat _10___ times. Do __10__ sessions per day.  http://[email or web address].exer.us/247   Copyright  VHI. All rights reserved.  Straight Leg Calf Stretch ([place]ast[place])   Put palms against wall, one leg forward and bent. With other leg back straight and heel flat on floor, lean into wall. Hold _30___ seconds. Change legs and repeat. Repeat _3___ times. Do _1___ sessions per day.  http://[email or web address].exer.us/419   Copyright  VHI. All rights reserved.   Back Wall Slide   With feet _12___ inches from wall, lean as much of back against the wall as possible. Gently squat down _12__ inches, keeping back against wall. Hold ___5_ seconds while counting out loud. Repeat __10__ times. Do __1__ sessions per  day.  http://[email or web address].exer.us/563   Copyright  VHI. All rights reserved.

## 2016-05-19 NOTE — Therapy (Signed)
[place] MAIN [facility] SERVICES [address] [place], [place], [address] Phone: [phone]   Fax:  [phone]  Physical Therapy Treatment  Patient Details  Name: Lisa [name] MRN: [id number] Date of Birth: February 21, 1927 Referring Provider: Alger Simons T  Encounter Date: 05/19/2016      PT End of Session - 05/19/16 0930    Visit Number 23   Number of Visits 17   Date for PT Re-Evaluation 06/04/16   Authorization Type 12   PT Start Time 0915   PT Stop Time 1000   PT Time Calculation (min) 45 min   Equipment Utilized During Treatment Gait belt   Activity Tolerance Patient tolerated treatment well   Behavior During Therapy [place] for tasks assessed/performed      Past Medical History:  Diagnosis Date  . Allergy   . Brain bleed ([place])   . Chronic systolic CHF (congestive heart failure) ([place])   . Depression   . Foot fracture    bilateral  . Hypertension   . Osteoarthritis   . Pelvis fracture ([place])   . Renal disorder   . Spinal stenosis   . V-tach ([place])   . Wrist fracture, right     Past Surgical History:  Procedure Laterality Date  . BACK SURGERY N/A   . BREAST LUMPECTOMY N/A     There were no vitals filed for this visit.      Subjective Assessment - 05/19/16 0929    Subjective Pt reports she is doing well today. She took 2 Tylenol prior to coming to therapy. No specific questions or concerns currently. She missed church yesterday because her back was hurting   Pertinent History anxiety, high blood pressure, heart disease, Osteoporosis   Limitations Walking;Standing   How long can you stand comfortably? Patient can stand for under 30 minutes   Patient Stated Goals to walk safely     Therapeutic exercises: hooklying TA with marching x 20 x 2 sets Hooklying Ta with RTB hip abd/ER x 20 x 2 sets Hooklying TA with single leg to chest x 20 x 2 BLE Single knee to chest stretch x 30 sec BLE Bridging x 10 x 2 Standing trunk  extension x 20  sidelying hip abd x 20 BLE sidelying clam x 20 BLE standing hip abd with YTB x 20  side stepping left and right in parallel bars 10 feet x 3 step ups from floor to 6 inch stool x 20 bilateral sit to stand x 10 marching in parallel bars x 20 Patient needs occasional verbal cueing to improve posture and cueing to correctly perform exercises slowly, holding at end of range to increase motor firing of desired muscle to encourage fatigue.                                PT Education - 05/19/16 0930    Education provided Yes   Education Details Ta contraction with lumbar stability exericses   Person(s) Educated Patient   Methods Explanation   Comprehension Verbalized understanding             PT Long Term Goals - 05/12/16 1153      PT LONG TERM GOAL #1   Title Patient will be independent in home exercise program to improve strength/mobility for better functional independence with ADLs.   Time 8   Period Weeks   Status Achieved     PT LONG  TERM GOAL #2   Title Patient (> 55 years old) will complete five times sit to stand test in < 15 seconds indicating an increased LE strength and improved balance.   Baseline 17/15   Time 8   Period Weeks   Status Partially Met     PT LONG TERM GOAL #3   Title Patient will increase six minute walk test distance to >1000 for progression to community ambulator and improve gait ability   Baseline 955   Time 8   Period Weeks   Status Partially Met     PT LONG TERM GOAL #4   Title Patient will reduce timed up and go to <11 seconds to reduce fall risk and demonstrate improved transfer/gait ability.   Baseline 16.03   Time 8   Status Partially Met               Plan - 05/19/16 0932    Clinical Impression Statement Patient has back pain that is intermittent and was instructed in core strengthening with TA contraction to help decrease her intermittent back pain.    Rehab Potential Good    Clinical Impairments Affecting Rehab Potential Positive: motivation; Negative: chronic instability   PT Frequency 2x / week   PT Duration 8 weeks   PT Treatment/Interventions Therapeutic exercise;Balance training;Therapeutic activities;Gait training   PT Next Visit Plan strengthen, endurance, balance training   PT Home Exercise Plan hip abd with YTB hip flex with YTB   Consulted and Agree with Plan of Care Patient;Other (Comment)      Patient will benefit from skilled therapeutic intervention in order to improve the following deficits and impairments:  Impaired flexibility, Difficulty walking, Decreased safety awareness, Decreased balance, Decreased activity tolerance, Decreased mobility, Pain  Visit Diagnosis: Unsteadiness on feet  Difficulty in walking, not elsewhere classified     Problem List Patient Active Problem List   Diagnosis Date Noted  . New onset atrial fibrillation ([place]) 12/11/2015  . Altered mental state   . Congestive dilated cardiomyopathy ([place])   . Dementia   . Headache 12/10/2015  . TIA (transient ischemic attack) 12/10/2015  . Influenza A 11/21/2015  . Ventricular tachycardia ([place]) 11/09/2015  . Constipation 09/12/2015  . Insomnia due to anxiety and fear 09/12/2015  . Depression, controlled 09/12/2015  . Bacterial UTI 08/27/2015  . DVT, lower extremity ([place]) 08/27/2015  . TBI (traumatic brain injury) ([place]) 08/22/2015  . ICH (intracerebral hemorrhage) ([place])   . Abscess   . Hypomagnesemia   . SDH (subdural hematoma) ([place])   . Boil of upper extremity   . Pneumonia due to Haemophilus influenzae ([place])   . Chronic systolic CHF (congestive heart failure) ([place])   . Acute on chronic renal failure ([place])   . Hypokalemia   . Bright red blood per rectum   . Dysphagia   . Pressure ulcer 08/16/2015  . Acute respiratory failure with hypoxemia ([place])   . Hypernatremia   . Subdural hematoma ([place]) 08/03/2015  . Lumbar radiculopathy 06/06/2015  . Spinal stenosis,  lumbar region, with neurogenic claudication 04/02/2015  . Degenerative lumbar disc 02/15/2015  . Lumbar post-laminectomy syndrome 02/15/2015  . Facet syndrome, lumbar 02/15/2015  . Sacroiliac joint dysfunction 02/15/2015   Alanson Puls, PT, DPT [place] 05/19/2016, 9:39 AM  [place] MAIN [facility] SERVICES [address] [place], [place], [address] Phone: [phone]   Fax:  [phone]  Name: Lisa [name] MRN: [id number] Date of Birth: June 07, 1927

## 2016-05-21 ENCOUNTER — Encounter: Payer: Self-pay | Admitting: Physical Therapy

## 2016-05-21 ENCOUNTER — Ambulatory Visit: Payer: Medicare Other | Admitting: Physical Therapy

## 2016-05-21 DIAGNOSIS — R262 Difficulty in walking, not elsewhere classified: Secondary | ICD-10-CM

## 2016-05-21 DIAGNOSIS — R2681 Unsteadiness on feet: Secondary | ICD-10-CM

## 2016-05-21 NOTE — Therapy (Signed)
[place] MAIN [facility] SERVICES [address] [place], [place], [address] Phone: [phone]   Fax:  [phone]  Physical Therapy Treatment  Patient Details  Name: Lisa [name] MRN: [id number] Date of Birth: [birth year] Referring Provider: Alger Simons T  Encounter Date: 05/21/2016      PT End of Session - 05/21/16 0941    Visit Number 24   Number of Visits 17   Date for PT Re-Evaluation 06/04/16   Authorization Type 13   PT Start Time 0920   PT Stop Time 1000   PT Time Calculation (min) 40 min   Equipment Utilized During Treatment Gait belt   Activity Tolerance Patient tolerated treatment well   Behavior During Therapy [facility] for tasks assessed/performed      Past Medical History:  Diagnosis Date  . Allergy   . Brain bleed ([place])   . Chronic systolic CHF (congestive heart failure) ([place])   . Depression   . Foot fracture    bilateral  . Hypertension   . Osteoarthritis   . Pelvis fracture ([place])   . Renal disorder   . Spinal stenosis   . V-tach ([place])   . Wrist fracture, right     Past Surgical History:  Procedure Laterality Date  . BACK SURGERY N/A   . BREAST LUMPECTOMY N/A     There were no vitals filed for this visit.      Subjective Assessment - 05/21/16 0940    Subjective Pt reports she is doing well today.  No specific questions or concerns currently.   Pertinent History anxiety, high blood pressure, heart disease, Osteoporosis   Limitations Walking;Standing   How long can you stand comfortably? Patient can stand for under 30 minutes   Patient Stated Goals to walk safely   Currently in Pain? No/denies   Pain Score 0-No pain      Therapeutic exercise and neuromuscular training: 1/2 foam flat side up and balance with head turns left and right feet apart and feet together,  tandem standing on 1/2 foam  , sorting balls and reaching on foam standing hip abd with YTB x 20   side stepping left and right in parallel  bars 10 feet x 3 step ups from floor to 6 inch stool x 20 bilateral marching in parallel bars x 20 Tilt board fwd/bwd, side to side left and right CGA and Min to mod verbal cues used throughout with increased in postural sway and LOB most seen with narrow base of support and while on uneven surfaces. Continues to have balance deficits typical with diagnosis. Patient performs intermediate level exercises without pain behaviors and needs verbal cuing for postural alignment and head positioning Tactile cues needed.                           PT Education - 05/21/16 0941    Education provided Yes   Education Details standing back extension exercises   Person(s) Educated Patient   Methods Explanation   Comprehension Verbalized understanding             PT Long Term Goals - 05/12/16 1153      PT LONG TERM GOAL #1   Title Patient will be independent in home exercise program to improve strength/mobility for better functional independence with ADLs.   Time 8   Period Weeks   Status Achieved     PT LONG TERM GOAL #2   Title Patient (>  80 years old) will complete five times sit to stand test in < 15 seconds indicating an increased LE strength and improved balance.   Baseline 17/15   Time 8   Period Weeks   Status Partially Met     PT LONG TERM GOAL #3   Title Patient will increase six minute walk test distance to >1000 for progression to community ambulator and improve gait ability   Baseline 955   Time 8   Period Weeks   Status Partially Met     PT LONG TERM GOAL #4   Title Patient will reduce timed up and go to <11 seconds to reduce fall risk and demonstrate improved transfer/gait ability.   Baseline 16.03   Time 8   Status Partially Met               Plan - 05/21/16 0942    Clinical Impression Statement Verbal cues needed to relax throughout.  Patient tolerates exercises without a rest period and has reports of pulling in her back with  extension.    Rehab Potential Good   Clinical Impairments Affecting Rehab Potential Positive: motivation; Negative: chronic instability   PT Frequency 2x / week   PT Duration 8 weeks   PT Treatment/Interventions Therapeutic exercise;Balance training;Therapeutic activities;Gait training   PT Next Visit Plan strengthen, endurance, balance training   PT Home Exercise Plan hip abd with YTB hip flex with YTB   Consulted and Agree with Plan of Care Patient;Other (Comment)      Patient will benefit from skilled therapeutic intervention in order to improve the following deficits and impairments:  Impaired flexibility, Difficulty walking, Decreased safety awareness, Decreased balance, Decreased activity tolerance, Decreased mobility, Pain  Visit Diagnosis: Unsteadiness on feet  Difficulty in walking, not elsewhere classified     Problem List Patient Active Problem List   Diagnosis Date Noted  . New onset atrial fibrillation ([place]) 12/11/2015  . Altered mental state   . Congestive dilated cardiomyopathy ([place])   . Dementia   . Headache 12/10/2015  . TIA (transient ischemic attack) 12/10/2015  . Influenza A 11/21/2015  . Ventricular tachycardia ([place]) 11/09/2015  . Constipation 09/12/2015  . Insomnia due to anxiety and fear 09/12/2015  . Depression, controlled 09/12/2015  . Bacterial UTI 08/27/2015  . DVT, lower extremity ([place]) 08/27/2015  . TBI (traumatic brain injury) ([place]) 08/22/2015  . ICH (intracerebral hemorrhage) ([place])   . Abscess   . Hypomagnesemia   . SDH (subdural hematoma) ([place])   . Boil of upper extremity   . Pneumonia due to Haemophilus influenzae ([place])   . Chronic systolic CHF (congestive heart failure) ([place])   . Acute on chronic renal failure ([place])   . Hypokalemia   . Bright red blood per rectum   . Dysphagia   . Pressure ulcer 08/16/2015  . Acute respiratory failure with hypoxemia ([place])   . Hypernatremia   . Subdural hematoma ([place]) 08/03/2015  . Lumbar  radiculopathy 06/06/2015  . Spinal stenosis, lumbar region, with neurogenic claudication 04/02/2015  . Degenerative lumbar disc 02/15/2015  . Lumbar post-laminectomy syndrome 02/15/2015  . Facet syndrome, lumbar 02/15/2015  . Sacroiliac joint dysfunction 02/15/2015   Alanson Puls, PT, DPT [place] S 05/21/2016, 9:48 AM  [place] MAIN [facility] SERVICES [address] [place], [place], [address] Phone: [phone]   Fax:  [phone]  Name: Lisa [name] MRN: [id number] Date of Birth: 1926-11-29

## 2016-05-23 ENCOUNTER — Encounter: Payer: Medicare Other | Admitting: Speech Pathology

## 2016-05-27 ENCOUNTER — Ambulatory Visit: Payer: Medicare Other | Attending: Physical Medicine & Rehabilitation | Admitting: Physical Therapy

## 2016-05-27 ENCOUNTER — Encounter: Payer: Self-pay | Admitting: Physical Therapy

## 2016-05-27 ENCOUNTER — Other Ambulatory Visit: Payer: Self-pay | Admitting: Physical Medicine & Rehabilitation

## 2016-05-27 DIAGNOSIS — R2681 Unsteadiness on feet: Secondary | ICD-10-CM | POA: Insufficient documentation

## 2016-05-27 DIAGNOSIS — F5105 Insomnia due to other mental disorder: Secondary | ICD-10-CM

## 2016-05-27 DIAGNOSIS — S069X1S Unspecified intracranial injury with loss of consciousness of 30 minutes or less, sequela: Secondary | ICD-10-CM

## 2016-05-27 DIAGNOSIS — R262 Difficulty in walking, not elsewhere classified: Secondary | ICD-10-CM | POA: Diagnosis present

## 2016-05-27 DIAGNOSIS — F409 Phobic anxiety disorder, unspecified: Secondary | ICD-10-CM

## 2016-05-27 NOTE — Therapy (Signed)
[place] MAIN [facility] SERVICES [address] [place], [place], [address] Phone: [phone]   Fax:  [phone]  Physical Therapy Treatment  Patient Details  Name: Lisa [name] MRN: [id number] Date of Birth: [birth year] Referring Provider: Alger Simons T  Encounter Date: 05/27/2016      PT End of Session - 05/27/16 1052    Visit Number 25   Number of Visits 17   Date for PT Re-Evaluation 06/04/16   Authorization Type 5   PT Start Time 1045   PT Stop Time 1130   PT Time Calculation (min) 45 min   Equipment Utilized During Treatment Gait belt   Activity Tolerance Patient tolerated treatment well   Behavior During Therapy [facility] for tasks assessed/performed      Past Medical History:  Diagnosis Date  . Allergy   . Brain bleed ([place])   . Chronic systolic CHF (congestive heart failure) ([place])   . Depression   . Foot fracture    bilateral  . Hypertension   . Osteoarthritis   . Pelvis fracture ([place])   . Renal disorder   . Spinal stenosis   . V-tach ([place])   . Wrist fracture, right     Past Surgical History:  Procedure Laterality Date  . BACK SURGERY N/A   . BREAST LUMPECTOMY N/A     There were no vitals filed for this visit.      Subjective Assessment - 05/27/16 1051    Subjective Pt reports she is doing well today.  No specific questions or concerns currently.   Pertinent History anxiety, high blood pressure, heart disease, Osteoporosis   Limitations Walking;Standing   How long can you stand comfortably? Patient can stand for under 30 minutes   Patient Stated Goals to walk safely     Therapeutic exercise: standing hip abd with YTB x 20  side stepping left and right in parallel bars 10 feet x 3 standing on blue foam with cone reaching x 20 across midline step ups from floor to 6 inch stool x 20 bilateral sit to stand x 10 marching in parallel bars x 20  NMR : Side stepping on blue  foam balance beam x 5 lengths of the  parallel bars  4 square fwd/bwd, side to side stepping/ diagonal stepping Standing on foam NBOS  sorting balls/ sorting shapes reaching  Min cueing needed to appropriately perform balance tasks with leg, hand, and head position. Decreased coordination demonstrated requiring consistent verbal cueing to correct form. . Patient continues to demonstrate some in coordination of movement with select exercises such as  stepping backwards. Patient responds well to verbal and tactile cues to correct form and technique.  CGA to SBA for safety with activities.                           PT Education - 05/27/16 1051    Education provided Yes   Education Details heel toe gait   Person(s) Educated Patient   Methods Explanation   Comprehension Verbalized understanding             PT Long Term Goals - 05/12/16 1153      PT LONG TERM GOAL #1   Title Patient will be independent in home exercise program to improve strength/mobility for better functional independence with ADLs.   Time 8   Period Weeks   Status Achieved     PT LONG TERM GOAL #2  Title Patient (> 79 years old) will complete five times sit to stand test in < 15 seconds indicating an increased LE strength and improved balance.   Baseline 17/15   Time 8   Period Weeks   Status Partially Met     PT LONG TERM GOAL #3   Title Patient will increase six minute walk test distance to >1000 for progression to community ambulator and improve gait ability   Baseline 955   Time 8   Period Weeks   Status Partially Met     PT LONG TERM GOAL #4   Title Patient will reduce timed up and go to <11 seconds to reduce fall risk and demonstrate improved transfer/gait ability.   Baseline 16.03   Time 8   Status Partially Met               Plan - 05/27/16 1052    Clinical Impression Statement Min cuing needed to maintain posture while performing balance tasks. Good form was demonstrated throughout exercises.   Rehab  Potential Good   Clinical Impairments Affecting Rehab Potential Positive: motivation; Negative: chronic instability   PT Frequency 2x / week   PT Duration 8 weeks   PT Treatment/Interventions Therapeutic exercise;Balance training;Therapeutic activities;Gait training   PT Next Visit Plan strengthen, endurance, balance training   PT Home Exercise Plan hip abd with YTB hip flex with YTB   Consulted and Agree with Plan of Care Patient;Other (Comment)      Patient will benefit from skilled therapeutic intervention in order to improve the following deficits and impairments:  Impaired flexibility, Difficulty walking, Decreased safety awareness, Decreased balance, Decreased activity tolerance, Decreased mobility, Pain  Visit Diagnosis: Unsteadiness on feet  Difficulty in walking, not elsewhere classified     Problem List Patient Active Problem List   Diagnosis Date Noted  . New onset atrial fibrillation ([place]) 12/11/2015  . Altered mental state   . Congestive dilated cardiomyopathy ([place])   . Dementia   . Headache 12/10/2015  . TIA (transient ischemic attack) 12/10/2015  . Influenza A 11/21/2015  . Ventricular tachycardia ([place]) 11/09/2015  . Constipation 09/12/2015  . Insomnia due to anxiety and fear 09/12/2015  . Depression, controlled 09/12/2015  . Bacterial UTI 08/27/2015  . DVT, lower extremity ([place]) 08/27/2015  . TBI (traumatic brain injury) ([place]) 08/22/2015  . ICH (intracerebral hemorrhage) ([place])   . Abscess   . Hypomagnesemia   . SDH (subdural hematoma) ([place])   . Boil of upper extremity   . Pneumonia due to Haemophilus influenzae ([place])   . Chronic systolic CHF (congestive heart failure) ([place])   . Acute on chronic renal failure ([place])   . Hypokalemia   . Bright red blood per rectum   . Dysphagia   . Pressure ulcer 08/16/2015  . Acute respiratory failure with hypoxemia ([place])   . Hypernatremia   . Subdural hematoma ([place]) 08/03/2015  . Lumbar radiculopathy 06/06/2015  .  Spinal stenosis, lumbar region, with neurogenic claudication 04/02/2015  . Degenerative lumbar disc 02/15/2015  . Lumbar post-laminectomy syndrome 02/15/2015  . Facet syndrome, lumbar 02/15/2015  . Sacroiliac joint dysfunction 02/15/2015   Alanson Puls, PT, DPT [place] S 05/27/2016, 10:54 AM  [place] MAIN [facility] SERVICES [address] [place], [place], [address] Phone: [phone]   Fax:  [phone]  Name: Lisa [name] MRN: [id number] Date of Birth: Jun 09, 1927

## 2016-05-29 ENCOUNTER — Ambulatory Visit: Payer: Medicare Other | Admitting: Physical Therapy

## 2016-05-29 ENCOUNTER — Encounter: Payer: Self-pay | Admitting: Physical Therapy

## 2016-05-29 DIAGNOSIS — R2681 Unsteadiness on feet: Secondary | ICD-10-CM

## 2016-05-29 DIAGNOSIS — R262 Difficulty in walking, not elsewhere classified: Secondary | ICD-10-CM

## 2016-05-29 NOTE — Therapy (Signed)
[facility] MAIN [facility] SERVICES [address] [place], [place], [address] Phone: [phone]   Fax:  [phone]  Physical Therapy Treatment  Patient Details  Name: Lisa [name] MRN: [id number] Date of Birth: May 02, 1927 Referring Provider: Faith Rogue T  Encounter Date: 05/29/2016      PT End of Session - 05/29/16 0931    Visit Number 26   Number of Visits 17   Date for PT Re-Evaluation 06/04/16   Authorization Type 6   PT Start Time 0915   PT Stop Time 1000   PT Time Calculation (min) 45 min   Equipment Utilized During Treatment Gait belt   Activity Tolerance Patient tolerated treatment well   Behavior During Therapy [facility] for tasks assessed/performed      Past Medical History:  Diagnosis Date  . Allergy   . Brain bleed (HCC)   . Chronic systolic CHF (congestive heart failure) (HCC)   . Depression   . Foot fracture    bilateral  . Hypertension   . Osteoarthritis   . Pelvis fracture (HCC)   . Renal disorder   . Spinal stenosis   . V-tach (HCC)   . Wrist fracture, right     Past Surgical History:  Procedure Laterality Date  . BACK SURGERY N/A   . BREAST LUMPECTOMY N/A     There were no vitals filed for this visit.      Subjective Assessment - 05/29/16 0930    Subjective Patient reports taht she stumbled yesterday and almost fell.    Pertinent History anxiety, high blood pressure, heart disease, Osteoporosis   Limitations Walking;Standing   How long can you stand comfortably? Patient can stand for under 30 minutes   Patient Stated Goals to walk safely   Currently in Pain? No/denies        THER-EX Standing exercises with RTB BLE : Marching 2 x 10; SLR 2 x 10; Abduction 2 x 10; Extension 2 x 10; Heel raises 2 x 10; Eccentric step downs x 10 BLE Squats x 10 with 5 sec hold Heel raises x 10 x 2  Resisted side-steeping RTB 4 lengths x 2; Standing mini squats 2 x 10 Sit to stand without UE support 2 x  10; Step-ups to 6" step x 10 bilateral; Quantum leg press 105#  x 10 x  3  NEUROMUSCULAR RE-EDUCATION Airex NBOS eyes open/closed x 30 seconds each; Airex NBOS eyes open horizontal and vertical head turns x 30 seconds; Airex cone  reaching crossing midline  Toe tapping 6 inch stool without UE assist Tandem gait in // bars x 4 laps      Pt reports pain with shoulder elevation during TM walking and fatigue to her shoulders bilaterally. Patient needs CGA for all balance activities.                        PT Education - 05/29/16 0930    Education provided Yes   Education Details watching the wheels on her RW tonot get caught on items and rugs   Person(s) Educated Patient   Methods Explanation   Comprehension Verbalized understanding             PT Long Term Goals - 05/12/16 1153      PT LONG TERM GOAL #1   Title Patient will be independent in home exercise program to improve strength/mobility for better functional independence with ADLs.   Time 8   Period Weeks  Status Achieved     PT LONG TERM GOAL #2   Title Patient (> 60 years old) will complete five times sit to stand test in < 15 seconds indicating an increased LE strength and improved balance.   Baseline 17/15   Time 8   Period Weeks   Status Partially Met     PT LONG TERM GOAL #3   Title Patient will increase six minute walk test distance to >1000 for progression to community ambulator and improve gait ability   Baseline 955   Time 8   Period Weeks   Status Partially Met     PT LONG TERM GOAL #4   Title Patient will reduce timed up and go to <11 seconds to reduce fall risk and demonstrate improved transfer/gait ability.   Baseline 16.03   Time 8   Status Partially Met               Plan - 05/29/16 0931    Clinical Impression Statement Pt demonstrates a step-to gait while ambulating stairs leading with LLE. Patient needs cuing for posture and correct form with exercises.   Rehab  Potential Good   Clinical Impairments Affecting Rehab Potential Positive: motivation; Negative: chronic instability   PT Frequency 2x / week   PT Duration 8 weeks   PT Treatment/Interventions Therapeutic exercise;Balance training;Therapeutic activities;Gait training   PT Next Visit Plan strengthen, endurance, balance training   PT Home Exercise Plan hip abd with YTB hip flex with YTB   Consulted and Agree with Plan of Care Patient;Other (Comment)      Patient will benefit from skilled therapeutic intervention in order to improve the following deficits and impairments:  Impaired flexibility, Difficulty walking, Decreased safety awareness, Decreased balance, Decreased activity tolerance, Decreased mobility, Pain  Visit Diagnosis: Unsteadiness on feet  Difficulty in walking, not elsewhere classified     Problem List Patient Active Problem List   Diagnosis Date Noted  . New onset atrial fibrillation ([place]) 12/11/2015  . Altered mental state   . Congestive dilated cardiomyopathy ([place])   . Dementia   . Headache 12/10/2015  . TIA (transient ischemic attack) 12/10/2015  . Influenza A 11/21/2015  . Ventricular tachycardia ([place]) 11/09/2015  . Constipation 09/12/2015  . Insomnia due to anxiety and fear 09/12/2015  . Depression, controlled 09/12/2015  . Bacterial UTI 08/27/2015  . DVT, lower extremity ([place]) 08/27/2015  . TBI (traumatic brain injury) ([place]) 08/22/2015  . ICH (intracerebral hemorrhage) ([place])   . Abscess   . Hypomagnesemia   . SDH (subdural hematoma) ([place])   . Boil of upper extremity   . Pneumonia due to Haemophilus influenzae ([place])   . Chronic systolic CHF (congestive heart failure) ([place])   . Acute on chronic renal failure ([place])   . Hypokalemia   . Bright red blood per rectum   . Dysphagia   . Pressure ulcer 08/16/2015  . Acute respiratory failure with hypoxemia ([place])   . Hypernatremia   . Subdural hematoma ([place]) 08/03/2015  . Lumbar radiculopathy 06/06/2015  .  Spinal stenosis, lumbar region, with neurogenic claudication 04/02/2015  . Degenerative lumbar disc 02/15/2015  . Lumbar post-laminectomy syndrome 02/15/2015  . Facet syndrome, lumbar 02/15/2015  . Sacroiliac joint dysfunction 02/15/2015  Alanson Puls, PT, DPT  [place] S 05/29/2016, 9:36 AM  [place] MAIN [facility] SERVICES [address] [place], [place], [address] Phone: [phone]   Fax:  [phone]  Name: Lisa [name] MRN: [id number] Date  of Birth: 11/29/1926

## 2016-06-03 ENCOUNTER — Encounter: Payer: Self-pay | Admitting: Physical Therapy

## 2016-06-03 ENCOUNTER — Ambulatory Visit: Payer: Medicare Other | Admitting: Physical Therapy

## 2016-06-03 DIAGNOSIS — R2681 Unsteadiness on feet: Secondary | ICD-10-CM | POA: Diagnosis not present

## 2016-06-03 DIAGNOSIS — R262 Difficulty in walking, not elsewhere classified: Secondary | ICD-10-CM

## 2016-06-03 NOTE — Therapy (Signed)
[place] [facility] MAIN [facility] SERVICES [address] [place], [place], [address] Phone: [phone]   Fax:  [phone]  Physical Therapy Treatment  Patient Details  Name: Lisa [name] MRN: [id number] Date of Birth: 1927-07-12 Referring Provider: Faith Rogue T  Encounter Date: 06/03/2016      PT End of Session - 06/03/16 0919    Visit Number 27   Number of Visits 18   Date for PT Re-Evaluation 06/04/16   Authorization Type 8   PT Start Time 0915   PT Stop Time 0955   PT Time Calculation (min) 40 min   Equipment Utilized During Treatment Gait belt   Activity Tolerance Patient tolerated treatment well   Behavior During Therapy [facility] for tasks assessed/performed      Past Medical History:  Diagnosis Date  . Allergy   . Brain bleed (HCC)   . Chronic systolic CHF (congestive heart failure) (HCC)   . Depression   . Foot fracture    bilateral  . Hypertension   . Osteoarthritis   . Pelvis fracture (HCC)   . Renal disorder   . Spinal stenosis   . V-tach (HCC)   . Wrist fracture, right     Past Surgical History:  Procedure Laterality Date  . BACK SURGERY N/A   . BREAST LUMPECTOMY N/A     There were no vitals filed for this visit.      Subjective Assessment - 06/03/16 0918    Subjective Patient reports that she is doing her HEP and feling more confident at home.    Pertinent History anxiety, high blood pressure, heart disease, Osteoporosis   Limitations Walking;Standing   How long can you stand comfortably? Patient can stand for under 30 minutes   Patient Stated Goals to walk safely   Currently in Pain? No/denies        Therapeutic exercise and neuromuscular training: 1/2 foam flat side up and balance with head turns left and right feet apart and feet together,  tandem standing on 1/2 foam   standing hip abd with YTB x 20   side stepping left and right in parallel bars 10 feet x 3 step ups from floor to 6 inch stool x 20  bilateral marching in parallel bars x 20 Tilt board fwd/bwd, side to side left and right Tm walking 1. 5 m/hour x 5 mins TM walking side stepping left and right x . 4 miles / hour Standing mini squats 2 x 10 with RTB around knees to encourage abduction; Sit to stand without UE support 2 x 10; Quantum leg press 100 lbs x 20x 2 sets Patient needs occasional verbal cueing to improve posture and cueing to correctly perform exercises slowly, holding at end of range to increase motor firing of desired muscle to encourage fatigue.                          PT Education - 06/03/16 [phone]    Education provided Yes   Education Details taking higher steps   Person(s) Educated Patient   Methods Explanation   Comprehension Verbalized understanding             PT Long Term Goals - 06/03/16 0921      PT LONG TERM GOAL #1   Title Patient will be independent in home exercise program to improve strength/mobility for better functional independence with ADLs.   Time 4   Period Weeks   Status  Achieved     PT LONG TERM GOAL #2   Title Patient (> 825 years old) will complete five times sit to stand test in < 15 seconds indicating an increased LE strength and improved balance.   Time 4   Period Weeks   Status On-going     PT LONG TERM GOAL #3   Title Patient will increase six minute walk test distance to >1000 for progression to community ambulator and improve gait ability   Time 4   Period Weeks   Status On-going     PT LONG TERM GOAL #4   Title Patient will reduce timed up and go to <11 seconds to reduce fall risk and demonstrate improved transfer/gait ability.   Time 4   Period Weeks   Status On-going               Plan - 06/03/16 0919    Clinical Impression Statement Patient is progressing towards her goals with mobility and improving her dynamic standing balance and is able to perform balance and strengthening exercises wihtout pain and needs CGA for higher  level  balance activities.    Rehab Potential Good   Clinical Impairments Affecting Rehab Potential Positive: motivation; Negative: chronic instability   PT Frequency 2x / week   PT Duration 8 weeks   PT Treatment/Interventions Therapeutic exercise;Balance training;Therapeutic activities;Gait training   PT Next Visit Plan strengthen, endurance, balance training   PT Home Exercise Plan hip abd with YTB hip flex with YTB   Consulted and Agree with Plan of Care Patient;Other (Comment)      Patient will benefit from skilled therapeutic intervention in order to improve the following deficits and impairments:  Impaired flexibility, Difficulty walking, Decreased safety awareness, Decreased balance, Decreased activity tolerance, Decreased mobility, Pain  Visit Diagnosis: Unsteadiness on feet  Difficulty in walking, not elsewhere classified     Problem List Patient Active Problem List   Diagnosis Date Noted  . New onset atrial fibrillation (HCC) 12/11/2015  . Altered mental state   . Congestive dilated cardiomyopathy (HCC)   . Dementia   . Headache 12/10/2015  . TIA (transient ischemic attack) 12/10/2015  . Influenza A 11/21/2015  . Ventricular tachycardia (HCC) 11/09/2015  . Constipation 09/12/2015  . Insomnia due to anxiety and fear 09/12/2015  . Depression, controlled 09/12/2015  . Bacterial UTI 08/27/2015  . DVT, lower extremity (HCC) 08/27/2015  . TBI (traumatic brain injury) (HCC) 08/22/2015  . ICH (intracerebral hemorrhage) (HCC)   . Abscess   . Hypomagnesemia   . SDH (subdural hematoma) (HCC)   . Boil of upper extremity   . Pneumonia due to Haemophilus influenzae (HCC)   . Chronic systolic CHF (congestive heart failure) (HCC)   . Acute on chronic renal failure (HCC)   . Hypokalemia   . Bright red blood per rectum   . Dysphagia   . Pressure ulcer 08/16/2015  . Acute respiratory failure with hypoxemia (HCC)   . Hypernatremia   . Subdural hematoma (HCC) 08/03/2015  .  Lumbar radiculopathy 06/06/2015  . Spinal stenosis, lumbar region, with neurogenic claudication 04/02/2015  . Degenerative lumbar disc 02/15/2015  . Lumbar post-laminectomy syndrome 02/15/2015  . Facet syndrome, lumbar 02/15/2015  . Sacroiliac joint dysfunction 02/15/2015   Ezekiel InaKristine S Ashir Kunz, PT, DPT [place], Tallan Sandoz S 06/03/2016, 9:23 AM  [place] [facility] [address] [place], [place], [address] Phone: [phone]   Fax:  [phone]  Name: Marita SnellenFaye K Tranchina MRN:  [id number] Date of Birth: Oct 16, 1926

## 2016-06-03 NOTE — Addendum Note (Signed)
Addended by: Ezekiel InaMANSFIELD, Jozette Castrellon S on: 06/03/2016 10:16 AM   Modules accepted: Orders

## 2016-06-05 ENCOUNTER — Encounter: Payer: Self-pay | Admitting: Physical Therapy

## 2016-06-05 ENCOUNTER — Ambulatory Visit: Payer: Medicare Other | Admitting: Physical Therapy

## 2016-06-05 DIAGNOSIS — R262 Difficulty in walking, not elsewhere classified: Secondary | ICD-10-CM

## 2016-06-05 DIAGNOSIS — R2681 Unsteadiness on feet: Secondary | ICD-10-CM

## 2016-06-05 NOTE — Therapy (Signed)
[facility] MAIN [facility] SERVICES [address] [place], [place], [address] Phone: [phone]   Fax:  [phone]  Physical Therapy Treatment  Patient Details  Name: Lisa [name] MRN: [id number] Date of Birth: 10/08/1926 Referring Provider: Faith Rogue T  Encounter Date: 06/05/2016      PT End of Session - 06/05/16 0921    Visit Number 28   Number of Visits 18   Date for PT Re-Evaluation 06/04/16   Authorization Type 9   PT Start Time 0915   PT Stop Time 1000   PT Time Calculation (min) 45 min   Equipment Utilized During Treatment Gait belt   Activity Tolerance Patient tolerated treatment well   Behavior During Therapy [facility] for tasks assessed/performed      Past Medical History:  Diagnosis Date  . Allergy   . Brain bleed (HCC)   . Chronic systolic CHF (congestive heart failure) (HCC)   . Depression   . Foot fracture    bilateral  . Hypertension   . Osteoarthritis   . Pelvis fracture (HCC)   . Renal disorder   . Spinal stenosis   . V-tach (HCC)   . Wrist fracture, right     Past Surgical History:  Procedure Laterality Date  . BACK SURGERY N/A   . BREAST LUMPECTOMY N/A     There were no vitals filed for this visit.      Subjective Assessment - 06/05/16 0919    Subjective Patient reports that she is doing her HEP and feling more confident at home. She is on a medication for a rash.    Pertinent History anxiety, high blood pressure, heart disease, Osteoporosis   Limitations Walking;Standing   How long can you stand comfortably? Patient can stand for under 30 minutes   Patient Stated Goals to walk safely   Currently in Pain? No/denies   Multiple Pain Sites No        Gait:  Ambulation x400 ft without AD with min guard.  Head turns reading cards on wall and decreased gait speed with the additional task. Slow and unsteady gait, but no LOB. Frequent cues for heel to toe pattern. Pt demonstrated increased difficulty  with heel strike on L LE more than R.    sit to stand from elevated mat 3x5 with mod cues for sequencing, reducing hip adduction Standing mini squat 2x10; standing ankle DF/PF 2x10; standing hip abd 2x10; standing march 2x10; standing hip extension 2x10; standing hamstring curl 2x10 Standing on [employer] wide BOS 1 min x 3 Side stepping on TM . 4 miles / hour x 3 minutes each side left and right Pt requires mod verbal and tactile cues for proper exercise performance                        PT Education - 06/05/16 0921    Education provided Yes   Education Details reviewed goals   Person(s) Educated Patient   Methods Explanation   Comprehension Verbalized understanding             PT Long Term Goals - 06/03/16 0921      PT LONG TERM GOAL #1   Title Patient will be independent in home exercise program to improve strength/mobility for better functional independence with ADLs.   Time 4   Period Weeks   Status Achieved     PT LONG TERM GOAL #2   Title Patient (> 14 years old) will  complete five times sit to stand test in < 15 seconds indicating an increased LE strength and improved balance.   Time 4   Period Weeks   Status On-going     PT LONG TERM GOAL #3   Title Patient will increase six minute walk test distance to >1000 for progression to community ambulator and improve gait ability   Time 4   Period Weeks   Status On-going     PT LONG TERM GOAL #4   Title Patient will reduce timed up and go to <11 seconds to reduce fall risk and demonstrate improved transfer/gait ability.   Time 4   Period Weeks   Status On-going               Plan - 06/05/16 [id number]    Clinical Impression Statement Patient has fatigue with standing exercises and needs constant VC to have correct posture. Patient has loss of balance and needs UE support intermittently thorough out exercise.    Rehab Potential Good   Clinical Impairments Affecting Rehab Potential Positive:  motivation; Negative: chronic instability   PT Frequency 2x / week   PT Duration 8 weeks   PT Treatment/Interventions Therapeutic exercise;Balance training;Therapeutic activities;Gait training   PT Next Visit Plan strengthen, endurance, balance training   PT Home Exercise Plan hip abd with YTB hip flex with YTB   Consulted and Agree with Plan of Care Patient;Other (Comment)      Patient will benefit from skilled therapeutic intervention in order to improve the following deficits and impairments:  Impaired flexibility, Difficulty walking, Decreased safety awareness, Decreased balance, Decreased activity tolerance, Decreased mobility, Pain  Visit Diagnosis: Unsteadiness on feet  Difficulty in walking, not elsewhere classified     Problem List Patient Active Problem List   Diagnosis Date Noted  . New onset atrial fibrillation (HCC) 12/11/2015  . Altered mental state   . Congestive dilated cardiomyopathy (HCC)   . Dementia   . Headache 12/10/2015  . TIA (transient ischemic attack) 12/10/2015  . Influenza A 11/21/2015  . Ventricular tachycardia (HCC) 11/09/2015  . Constipation 09/12/2015  . Insomnia due to anxiety and fear 09/12/2015  . Depression, controlled 09/12/2015  . Bacterial UTI 08/27/2015  . DVT, lower extremity (HCC) 08/27/2015  . TBI (traumatic brain injury) (HCC) 08/22/2015  . ICH (intracerebral hemorrhage) (HCC)   . Abscess   . Hypomagnesemia   . SDH (subdural hematoma) (HCC)   . Boil of upper extremity   . Pneumonia due to Haemophilus influenzae (HCC)   . Chronic systolic CHF (congestive heart failure) (HCC)   . Acute on chronic renal failure (HCC)   . Hypokalemia   . Bright red blood per rectum   . Dysphagia   . Pressure ulcer 08/16/2015  . Acute respiratory failure with hypoxemia (HCC)   . Hypernatremia   . Subdural hematoma (HCC) 08/03/2015  . Lumbar radiculopathy 06/06/2015  . Spinal stenosis, lumbar region, with neurogenic claudication 04/02/2015  .  Degenerative lumbar disc 02/15/2015  . Lumbar post-laminectomy syndrome 02/15/2015  . Facet syndrome, lumbar 02/15/2015  . Sacroiliac joint dysfunction 02/15/2015   Ezekiel InaKristine S Ngoc Detjen, PT, DPT [place], Barkley BrunsKristine S 06/05/2016, 9:25 AM  [facility] [address] [place], [place], [address] Phone: [phone]   Fax:  [phone]  Name: Marita SnellenFaye K Sole MRN: [id number] Date of Birth: 02/04/1927

## 2016-06-05 NOTE — Patient Instructions (Signed)
HEP including 4 way hip x 15 x 2 sets daily Heel raises x 15 x 2 daily Sit to stand x 10 x 2 daily

## 2016-06-09 ENCOUNTER — Encounter: Payer: Self-pay | Admitting: Cardiovascular Disease

## 2016-06-09 ENCOUNTER — Ambulatory Visit (INDEPENDENT_AMBULATORY_CARE_PROVIDER_SITE_OTHER): Payer: Medicare Other | Admitting: Cardiovascular Disease

## 2016-06-09 VITALS — BP 160/60 | Ht 63.0 in | Wt 171.5 lb

## 2016-06-09 DIAGNOSIS — I482 Chronic atrial fibrillation, unspecified: Secondary | ICD-10-CM

## 2016-06-09 DIAGNOSIS — I82401 Acute embolism and thrombosis of unspecified deep veins of right lower extremity: Secondary | ICD-10-CM

## 2016-06-09 DIAGNOSIS — I5022 Chronic systolic (congestive) heart failure: Secondary | ICD-10-CM

## 2016-06-09 DIAGNOSIS — I639 Cerebral infarction, unspecified: Secondary | ICD-10-CM | POA: Diagnosis not present

## 2016-06-09 NOTE — Patient Instructions (Signed)
Medication Instructions:  Your physician recommends that you continue on your current medications as directed. Please refer to the Current Medication list given to you today.   Labwork: CBC, BMET, liver, TSH  Testing/Procedures: none  Follow-Up: Your physician wants you to follow-up in: 4 months with Dr. Kirke CorinArida.  You will receive a reminder letter in the mail two months in advance. If you don't receive a letter, please call our office to schedule the follow-up appointment.   Any Other Special Instructions Will Be Listed Below (If Applicable).     If you need a refill on your cardiac medications before your next appointment, please call your pharmacy.

## 2016-06-09 NOTE — Progress Notes (Signed)
Cardiology Office Note   Date:  06/09/2016   ID:  Lisa [name], DOB 04-Sep-1927, MRN [id number]  PCP:  Rozanna Box, MD  Cardiologist:   Lorine Bears, MD   Chief Complaint  Patient presents with  . other    3 month follow up. Meds reviewed by the pt. verbally. Pt. c/o shortness of breath with a cough.       History of Present Illness: Lisa [name] is a 82 y.o. female who presents for a follow-up visit regarding chronic systolic heart failure , LBBB and paroxysmal atrial fibrillation.   She presented in November 2016 with subdural and subarachnoid hemorrhage after a fall. She suffered cardiac arrest at that time due to ventricular tachycardia. Troponin was mildly elevated. Echocardiogram showed an ejection fraction of 15-20% with akinesis of the entire anteroseptal myocardium with moderate pulmonary hypertension. She was suspected of having stress-induced cardiomyopathy and was managed conservatively. She was placed on amiodarone for ventricular tachycardia. A follow-up echocardiogram in February 2017 showed an ejection fraction of 35-40% with severe anteroseptal hypokinesis. She was hospitalized in early March with influenza A. She had another hospitalization in late March with headache, difficulty in speech and numbness in the left side of the face. MRI was negative for acute pathology. She had an episode of atrial fibrillation while hospitalized which lasted for about 2 hours. Amiodarone was resumed.  Since last visit, she reports no hospitalizations or emergency room visits. She denies any chest pain. Dyspnea slightly worse. She has gained 20 pounds since her last visit. She attributes that to eating ice cream daily. There is no significant leg edema and she denies orthopnea or PND.   Past Medical History:  Diagnosis Date  . Allergy   . Brain bleed (HCC)   . Chronic systolic CHF (congestive heart failure) (HCC)   . Depression   . Foot fracture    bilateral  . Hypertension     . Osteoarthritis   . Pelvis fracture (HCC)   . Renal disorder   . Spinal stenosis   . V-tach (HCC)   . Wrist fracture, right     Past Surgical History:  Procedure Laterality Date  . BACK SURGERY N/A   . BREAST LUMPECTOMY N/A      Current Outpatient Prescriptions  Medication Sig Dispense Refill  . acetaminophen (TYLENOL) 325 MG tablet Take 1-2 tablets (325-650 mg total) by mouth every 4 (four) hours as needed for mild pain.    [name] amiodarone (PACERONE) 200 MG tablet Take 1 tablet (200 mg total) by mouth daily. 30 tablet 3  . apixaban (ELIQUIS) 2.5 MG TABS tablet Take 1 tablet (2.5 mg total) by mouth 2 (two) times daily. 60 tablet 3  . citalopram (CELEXA) 20 MG tablet   4  . irbesartan (AVAPRO) 150 MG tablet Take 150 mg by mouth daily.    . metoprolol tartrate (LOPRESSOR) 25 MG tablet Take 12.5 mg by mouth 2 (two) times daily.   4  . mirtazapine (REMERON) 15 MG tablet   4  . omeprazole (PRILOSEC) 20 MG capsule Take 1 capsule by mouth daily.  10  . potassium chloride SA (K-DUR,KLOR-CON) 20 MEQ tablet Take 1 tablet (20 mEq total) by mouth daily.    . QUEtiapine (SEROQUEL) 25 MG tablet TAKE 1 TABLET BY MOUTH EVERY EVENING AT BEDTIME 30 tablet 2  . raloxifene (EVISTA) 60 MG tablet Take 1 tablet by mouth daily.    [name] senna-docusate (SENOKOT-S) 8.6-50 MG tablet Take 2  tablets by mouth 2 (two) times daily.     No current facility-administered medications for this visit.     Allergies:   Ambien [zolpidem tartrate]; Daypro [oxaprozin]; Oxycodone; Shellfish allergy; Ace inhibitors; and Norvasc [amlodipine besylate]    Social History:  The patient  reports that she has never smoked. She has never used smokeless tobacco. She reports that she does not drink alcohol or use drugs.   Family History:  [name] family history includes COPD in her father and mother; Heart disease in her father.    ROS:  Please see the history of present illness.   Otherwise, review of systems are positive for  none.   All other systems are reviewed and negative.    PHYSICAL EXAM: VS:  BP (!) 160/60 (BP Location: Left Arm, Patient Position: Sitting, Cuff Size: Normal)   Ht 5\' 3"  (1.6 m)   Wt 171 lb 8 oz (77.8 kg)   BMI 30.38 kg/m  , BMI Body mass index is 30.38 kg/m. GEN: Well nourished, well developed, in no acute distress  HEENT: normal  Neck: no JVD, carotid bruits, or masses Cardiac: RRR; no murmurs, rubs, or gallops,no edema  Respiratory:  clear to auscultation bilaterally, normal work of breathing GI: soft, nontender, nondistended, + BS MS: no deformity or atrophy  Skin: warm and dry, no rash Neuro:  Strength and sensation are intact Psych: euthymic mood, full affect   EKG:  EKG is ordered today. The ekg ordered today demonstrates normal sinus rhythm with left bundle branch block.   Recent Labs: 08/21/2015: Magnesium 1.9 11/21/2015: TSH 8.995 12/13/2015: ALT 13; B Natriuretic Peptide 48.0; BUN 20; Creatinine, Ser 1.56; Hemoglobin 10.6; Platelets 231; Potassium 4.5; Sodium 140    Lipid Panel    Component Value Date/Time   CHOL 174 12/10/2015 0537   TRIG 115 12/10/2015 0537   HDL 44 12/10/2015 0537   CHOLHDL 4.0 12/10/2015 0537   VLDL 23 12/10/2015 0537   LDLCALC 107 (H) 12/10/2015 0537      Wt Readings from Last 3 Encounters:  06/09/16 171 lb 8 oz (77.8 kg)  12/28/15 149 lb 8 oz (67.8 kg)  12/13/15 147 lb 3 oz (66.8 kg)        ASSESSMENT AND PLAN:  1.  Chronic systolic heart failure: Due to stress-induced cardiomyopathy versus ischemic cardiomyopathy. Although she has gained 20 pounds since her last visit, she appears to be euvolemic. The weight gain seems to be due to something else most likely consuming too much carbohydrates.  Continue treatment with small dose metoprolol and ARB. Carvedilol caused bradycardia in the past and thus I'm not going to increase the dose of metoprolol.  2. Paroxysmal atrial fibrillation: She is currently in normal sinus rhythm.  Continue amiodarone 200 mg once daily. She is on anticoagulation with low dose Eliquis. I requested routine labs including CBC and basic metabolic profile.  3. Weight gain: We have to rule out iatrogenic hypothyroidism related to amiodarone treatment. I requested thyroid and liver panel. I discussed with her the importance of decreasing carbohydrate intake.   Disposition:   FU with me in 4 months  Signed,  Lorine BearsMuhammad Zeriyah Wain, MD  06/09/2016 1:18 PM    [facility]

## 2016-06-10 LAB — HEPATIC FUNCTION PANEL
ALT: 18 IU/L (ref 0–32)
AST: 30 IU/L (ref 0–40)
Albumin: 4.1 g/dL (ref 3.5–4.7)
Alkaline Phosphatase: 53 IU/L (ref 39–117)
Bilirubin, Direct: 0.07 mg/dL (ref 0.00–0.40)
Total Protein: 7.4 g/dL (ref 6.0–8.5)

## 2016-06-10 LAB — CBC
HEMOGLOBIN: 9.6 g/dL — AB (ref 11.1–15.9)
Hematocrit: 29.9 % — ABNORMAL LOW (ref 34.0–46.6)
MCH: 27.6 pg (ref 26.6–33.0)
MCHC: 32.1 g/dL (ref 31.5–35.7)
MCV: 86 fL (ref 79–97)
Platelets: 305 10*3/uL (ref 150–379)
RBC: 3.48 x10E6/uL — AB (ref 3.77–5.28)
RDW: 18.1 % — ABNORMAL HIGH (ref 12.3–15.4)
WBC: 11.9 10*3/uL — AB (ref 3.4–10.8)

## 2016-06-10 LAB — TSH: TSH: 6.35 u[IU]/mL — ABNORMAL HIGH (ref 0.450–4.500)

## 2016-06-10 LAB — BASIC METABOLIC PANEL
BUN/Creatinine Ratio: 13 (ref 12–28)
BUN: 30 mg/dL — AB (ref 8–27)
CALCIUM: 8.9 mg/dL (ref 8.7–10.3)
CHLORIDE: 105 mmol/L (ref 96–106)
CO2: 18 mmol/L (ref 18–29)
CREATININE: 2.3 mg/dL — AB (ref 0.57–1.00)
GFR calc Af Amer: 21 mL/min/{1.73_m2} — ABNORMAL LOW (ref >59)
GFR calc non Af Amer: 19 mL/min/{1.73_m2} — ABNORMAL LOW (ref >59)
GLUCOSE: 95 mg/dL (ref 65–99)
Potassium: 6.2 mmol/L — ABNORMAL HIGH (ref 3.5–5.2)
Sodium: 141 mmol/L (ref 134–144)

## 2016-06-11 ENCOUNTER — Ambulatory Visit: Payer: Medicare Other

## 2016-06-11 VITALS — BP 174/54 | HR 68

## 2016-06-11 DIAGNOSIS — R262 Difficulty in walking, not elsewhere classified: Secondary | ICD-10-CM

## 2016-06-11 DIAGNOSIS — R2681 Unsteadiness on feet: Secondary | ICD-10-CM

## 2016-06-11 NOTE — Therapy (Signed)
[place] [facility] MAIN [facility] [address] [place], [place], [address] Phone: [phone]   Fax:  [phone]  Physical Therapy Treatment  Patient Details  Name: Lisa [name] MRN: [id number] Date of Birth: 11/11/1926 Referring Provider: Faith RogueSWARTZ, ZACHARY T  Encounter Date: 06/11/2016      PT End of Session - 06/11/16 0909    Visit Number 29   Number of Visits 18   Date for PT Re-Evaluation 08/26/16   Authorization Type 8   PT Start Time 0910   PT Stop Time 0955   PT Time Calculation (min) 45 min   Equipment Utilized During Treatment Gait belt   Activity Tolerance Patient tolerated treatment well   Behavior During [facility] for tasks assessed/performed      Past Medical History:  Diagnosis Date  . Allergy   . Brain bleed (HCC)   . Chronic systolic CHF (congestive heart failure) (HCC)   . Depression   . Foot fracture    bilateral  . Hypertension   . Osteoarthritis   . Pelvis fracture (HCC)   . Renal disorder   . Spinal stenosis   . V-tach (HCC)   . Wrist fracture, right     Past Surgical History:  Procedure Laterality Date  . BACK SURGERY N/A   . BREAST LUMPECTOMY N/A     Vitals:   06/11/16 0914  BP: (!) 174/54  Pulse: 68  SpO2: 99%        Subjective Assessment - 06/11/16 0909    Subjective Pt states she is doing well on this date. No specific questions or concerns currently. She is performing HEP. Denies pain on this date.    Pertinent History anxiety, high blood pressure, heart disease, Osteoporosis   Limitations Walking;Standing   How long can you stand comfortably? Patient can stand for under 30 minutes   Patient Stated Goals to walk safely   Currently in Pain? No/denies       TREATMENT   NEUROMUSCULAR RE-EDUCATION Heel/toe lifts in // bars x 20 each; Forward lunges with shoulder flexion, cervical extension looking toward ceiling to challenge balance; Standing on AIREX toe taps to 6" step no UE  support x 10 each; Standing on AIREX toe taps to different colored stepping stones with 1 and 2 stone sequences alternating LE, no UE support x multiple bouts;  Pt requires CGA/minAfor safety on balance exercises   THER-EX Sit to stand from regular height chair x 10, with 2kg weighted ball overhead press x 10; Resisted side stepping with red tband in parallel bars with UE support x 6 lengths, added mini squat x 6 additional lengths; Standing hip abduction with red tband 2 x 15 bilateral; Matrix resisted walking 7.5# x 3 backwards and x 3 side stepping to the R, discontinued after side stepping due to increase in low back pain;   Pt requires mod verbal and tactile cues for proper exercise performance                           PT Education - 06/11/16 0909    Education provided Yes   Education Details Reinforced HEP   Person(s) Educated Patient   Methods Explanation   Comprehension Verbalized understanding             PT Long Term Goals - 06/03/16 0921      PT LONG TERM GOAL #1   Title Patient will be independent in home exercise  program to improve strength/mobility for better functional independence with ADLs.   Time 4   Period Weeks   Status Achieved     PT LONG TERM GOAL #2   Title Patient (> 71 years old) will complete five times sit to stand test in < 15 seconds indicating an increased LE strength and improved balance.   Time 4   Period Weeks   Status On-going     PT LONG TERM GOAL #3   Title Patient will increase six minute walk test distance to >1000 for progression to community ambulator and improve gait ability   Time 4   Period Weeks   Status On-going     PT LONG TERM GOAL #4   Title Patient will reduce timed up and go to <11 seconds to reduce fall risk and demonstrate improved transfer/gait ability.   Time 4   Period Weeks   Status On-going               Plan - 06/11/16 0910    Clinical Impression Statement Pt  demonstrating improving LE strength today with sit to stand and overhead weighted ball press. She is limited with resisted walking due to hip pain. Requires intermittent seated rest breaks due to fatigue. Pt will continue to benefit from skilled PT services to address remaining deficits and improve function at home.    Rehab Potential Good   Clinical Impairments Affecting Rehab Potential Positive: motivation; Negative: chronic instability   PT Frequency 2x / week   PT Duration 8 weeks   PT Treatment/Interventions Therapeutic exercise;Balance training;Therapeutic activities;Gait training   PT Next Visit Plan strengthening, endurance, balance training   PT Home Exercise Plan hip abd with YTB hip flex with YTB   Consulted and Agree with Plan of Care Patient;Other (Comment)      Patient will benefit from skilled therapeutic intervention in order to improve the following deficits and impairments:  Impaired flexibility, Difficulty walking, Decreased safety awareness, Decreased balance, Decreased activity tolerance, Decreased mobility, Pain  Visit Diagnosis: Unsteadiness on feet  Difficulty in walking, not elsewhere classified     Problem List Patient Active Problem List   Diagnosis Date Noted  . New onset atrial fibrillation (HCC) 12/11/2015  . Altered mental state   . Congestive dilated cardiomyopathy (HCC)   . Dementia   . Headache 12/10/2015  . TIA (transient ischemic attack) 12/10/2015  . Influenza A 11/21/2015  . Ventricular tachycardia (HCC) 11/09/2015  . Constipation 09/12/2015  . Insomnia due to anxiety and fear 09/12/2015  . Depression, controlled 09/12/2015  . Bacterial UTI 08/27/2015  . DVT, lower extremity (HCC) 08/27/2015  . TBI (traumatic brain injury) (HCC) 08/22/2015  . ICH (intracerebral hemorrhage) (HCC)   . Abscess   . Hypomagnesemia   . SDH (subdural hematoma) (HCC)   . Boil of upper extremity   . Pneumonia due to Haemophilus influenzae (HCC)   . Chronic  systolic CHF (congestive heart failure) (HCC)   . Acute on chronic renal failure (HCC)   . Hypokalemia   . Bright red blood per rectum   . Dysphagia   . Pressure ulcer 08/16/2015  . Acute respiratory failure with hypoxemia (HCC)   . Hypernatremia   . Subdural hematoma (HCC) 08/03/2015  . Lumbar radiculopathy 06/06/2015  . Spinal stenosis, lumbar region, with neurogenic claudication 04/02/2015  . Degenerative lumbar disc 02/15/2015  . Lumbar post-laminectomy syndrome 02/15/2015  . Facet syndrome, lumbar 02/15/2015  . Sacroiliac joint dysfunction 02/15/2015   Lisa [name]  PT, DPT   Lisa [name] 06/12/2016, 2:46 PM  [place] [facility] MAIN [facility] SERVICES [address] [place], [place], [address] Phone: [phone]   Fax:  [phone]  Name: Lisa [name] MRN: [id number] Date of Birth: Nov 14, 1926

## 2016-06-13 ENCOUNTER — Telehealth: Payer: Self-pay | Admitting: *Deleted

## 2016-06-13 NOTE — Telephone Encounter (Signed)
Prior authorization submitted for Quetiapine Fumarate submitted and DENIED.  FYI.[name]...[name].we need diagnosis codes for attention deficits resulting from a TBI

## 2016-06-17 ENCOUNTER — Other Ambulatory Visit: Payer: Self-pay

## 2016-06-17 ENCOUNTER — Ambulatory Visit: Payer: Medicare Other | Admitting: Physical Therapy

## 2016-06-17 DIAGNOSIS — R2681 Unsteadiness on feet: Secondary | ICD-10-CM | POA: Diagnosis not present

## 2016-06-17 DIAGNOSIS — R262 Difficulty in walking, not elsewhere classified: Secondary | ICD-10-CM

## 2016-06-17 MED ORDER — AMIODARONE HCL 200 MG PO TABS: 200.0000 mg | ORAL_TABLET | Freq: Every day | ORAL | 3 refills | Status: DC

## 2016-06-17 NOTE — Therapy (Addendum)
[place] MAIN [facility] SERVICES [address] [place], [place], [address] Phone: [phone]   Fax:  [phone]  Physical Therapy Treatment/ Discharge summary Patient Details  Name: Lisa [name] MRN: [id number] Date of Birth: 1927-02-11 Referring Provider: Alger Simons T  Encounter Date: 06/17/2016      PT End of Session - 06/17/16 1322    Visit Number 30   Number of Visits 18   Date for PT Re-Evaluation 08/26/16   Authorization Type 10   PT Start Time 1130   PT Stop Time 1215   PT Time Calculation (min) 45 min   Equipment Utilized During Treatment Gait belt   Activity Tolerance Patient tolerated treatment well   Behavior During Therapy [facility] for tasks assessed/performed      Past Medical History:  Diagnosis Date  . Allergy   . Brain bleed ([place])   . Chronic systolic CHF (congestive heart failure) ([place])   . Depression   . Foot fracture    bilateral  . Hypertension   . Osteoarthritis   . Pelvis fracture ([place])   . Renal disorder   . Spinal stenosis   . V-tach ([place])   . Wrist fracture, right     Past Surgical History:  Procedure Laterality Date  . BACK SURGERY N/A   . BREAST LUMPECTOMY N/A     There were no vitals filed for this visit.      Subjective Assessment - 06/17/16 1321    Subjective Pt states she is doing well on this date. No specific questions or concerns currently. She is performing HEP. Denies pain on this date. She fees ready for DC.   Pertinent History anxiety, high blood pressure, heart disease, Osteoporosis   Limitations Walking;Standing   How long can you stand comfortably? Patient can stand for under 30 minutes   Patient Stated Goals to walk safely     Therapeutic exercise and NMR:  side stepping left and right in parallel bars on blue foam 10 feet x 3 standing on blue foam with cone reaching x 20 across midline step ups from floor to 6 inch stool from blue foam x 20 bilateral 1/2 foam flat side up  and balance with head turns left and right feet apart and feet together,  tandem standing on 1/2 foam   step ups from floor to 6 inch stool x 20 bilateral Tilt board fwd/bwd, side to side left and right  Patient is able to perform all exercises and is able to ambulate independently with AD on level surfaces. She is independent with HEP.                           PT Education - 06/17/16 1321    Education provided Yes   Education Details HEP    Person(s) Educated Patient   Comprehension Verbalized understanding             PT Long Term Goals - 06/17/16 1418      PT LONG TERM GOAL #1   Title Patient will be independent in home exercise program to improve strength/mobility for better functional independence with ADLs.   Time 4   Period Weeks   Status Achieved     PT LONG TERM GOAL #2   Time 4   Period Weeks   Status Partially Met     PT LONG TERM GOAL #3   Title Patient will increase six minute walk test distance  to >1000 for progression to community ambulator and improve gait ability   Time 4   Period Weeks   Status Partially Met     PT LONG TERM GOAL #4   Title Patient will reduce timed up and go to <11 seconds to reduce fall risk and demonstrate improved transfer/gait ability.   Time 4   Period Weeks   Status Partially Met               Plan - 2016/07/16 1408    Clinical Impression Statement Patient was seen for dynamic standing balance exercise and LE exercises with good tolerance and no reports of increased pain. She is independent with HEP and will be DC today.    Rehab Potential Good   Clinical Impairments Affecting Rehab Potential Positive: motivation; Negative: chronic instability   PT Frequency 2x / week   PT Duration 8 weeks   PT Treatment/Interventions Therapeutic exercise;Balance training;Therapeutic activities;Gait training   PT Next Visit Plan strengthening, endurance, balance training   PT Home Exercise Plan hip abd with YTB  hip flex with YTB   Consulted and Agree with Plan of Care Patient;Other (Comment)      Patient will benefit from skilled therapeutic intervention in order to improve the following deficits and impairments:  Impaired flexibility, Difficulty walking, Decreased safety awareness, Decreased balance, Decreased activity tolerance, Decreased mobility, Pain  Visit Diagnosis: Unsteadiness on feet  Difficulty in walking, not elsewhere classified       G-Codes - Jul 16, 2016 1411    Functional Assessment Tool Used 5 x sit to stand, TUG, 6 MW, 10 MW   Functional Limitation Mobility: Walking and moving around   Mobility: Walking and Moving Around Current Status [phone]) At least 40 percent but less than 60 percent impaired, limited or restricted   Mobility: Walking and Moving Around Goal Status [phone]) At least 20 percent but less than 40 percent impaired, limited or restricted   Mobility: Walking and Moving Around Discharge Status [phone]) At least 20 percent but less than 40 percent impaired, limited or restricted      Problem List Patient Active Problem List   Diagnosis Date Noted  . New onset atrial fibrillation ([place]) 12/11/2015  . Altered mental state   . Congestive dilated cardiomyopathy ([place])   . Dementia   . Headache 12/10/2015  . TIA (transient ischemic attack) 12/10/2015  . Influenza A 11/21/2015  . Ventricular tachycardia ([place]) 11/09/2015  . Constipation 09/12/2015  . Insomnia due to anxiety and fear 09/12/2015  . Depression, controlled 09/12/2015  . Bacterial UTI 08/27/2015  . DVT, lower extremity ([place]) 08/27/2015  . TBI (traumatic brain injury) ([place]) 08/22/2015  . ICH (intracerebral hemorrhage) ([place])   . Abscess   . Hypomagnesemia   . SDH (subdural hematoma) ([place])   . Boil of upper extremity   . Pneumonia due to Haemophilus influenzae ([place])   . Chronic systolic CHF (congestive heart failure) ([place])   . Acute on chronic renal failure ([place])   . Hypokalemia   . Bright red blood  per rectum   . Dysphagia   . Pressure ulcer 08/16/2015  . Acute respiratory failure with hypoxemia ([place])   . Hypernatremia   . Subdural hematoma ([place]) 08/03/2015  . Lumbar radiculopathy 06/06/2015  . Spinal stenosis, lumbar region, with neurogenic claudication 04/02/2015  . Degenerative lumbar disc 02/15/2015  . Lumbar post-laminectomy syndrome 02/15/2015  . Facet syndrome, lumbar 02/15/2015  . Sacroiliac joint dysfunction 02/15/2015   Alanson Puls, PT, DPT [place], [place]  S 06/17/2016, 2:19 PM  [place] MAIN [facility] SERVICES [address] [place], [place], [address] Phone: [phone]   Fax:  [phone]  Name: Lisa [name] MRN: [id number] Date of Birth: 09-21-1927

## 2016-07-17 ENCOUNTER — Other Ambulatory Visit: Payer: Self-pay | Admitting: Physical Medicine & Rehabilitation

## 2016-07-17 DIAGNOSIS — F5105 Insomnia due to other mental disorder: Secondary | ICD-10-CM

## 2016-07-17 DIAGNOSIS — F409 Phobic anxiety disorder, unspecified: Secondary | ICD-10-CM

## 2016-07-17 DIAGNOSIS — S069X1S Unspecified intracranial injury with loss of consciousness of 30 minutes or less, sequela: Secondary | ICD-10-CM

## 2016-07-23 ENCOUNTER — Encounter: Payer: Self-pay | Admitting: Physical Medicine & Rehabilitation

## 2016-07-23 ENCOUNTER — Encounter: Payer: Medicare Other | Attending: Physical Medicine & Rehabilitation | Admitting: Physical Medicine & Rehabilitation

## 2016-07-23 VITALS — BP 178/75 | HR 78 | Resp 14

## 2016-07-23 DIAGNOSIS — F5105 Insomnia due to other mental disorder: Secondary | ICD-10-CM | POA: Diagnosis not present

## 2016-07-23 DIAGNOSIS — S065X9S Traumatic subdural hemorrhage with loss of consciousness of unspecified duration, sequela: Secondary | ICD-10-CM | POA: Diagnosis not present

## 2016-07-23 DIAGNOSIS — Z87820 Personal history of traumatic brain injury: Secondary | ICD-10-CM | POA: Diagnosis not present

## 2016-07-23 DIAGNOSIS — Z8249 Family history of ischemic heart disease and other diseases of the circulatory system: Secondary | ICD-10-CM | POA: Diagnosis not present

## 2016-07-23 DIAGNOSIS — I509 Heart failure, unspecified: Secondary | ICD-10-CM | POA: Diagnosis not present

## 2016-07-23 DIAGNOSIS — Z8673 Personal history of transient ischemic attack (TIA), and cerebral infarction without residual deficits: Secondary | ICD-10-CM | POA: Insufficient documentation

## 2016-07-23 DIAGNOSIS — X58XXXS Exposure to other specified factors, sequela: Secondary | ICD-10-CM | POA: Diagnosis not present

## 2016-07-23 DIAGNOSIS — S066X9S Traumatic subarachnoid hemorrhage with loss of consciousness of unspecified duration, sequela: Secondary | ICD-10-CM | POA: Diagnosis not present

## 2016-07-23 DIAGNOSIS — F409 Phobic anxiety disorder, unspecified: Secondary | ICD-10-CM | POA: Diagnosis not present

## 2016-07-23 DIAGNOSIS — Z9889 Other specified postprocedural states: Secondary | ICD-10-CM | POA: Diagnosis not present

## 2016-07-23 DIAGNOSIS — R269 Unspecified abnormalities of gait and mobility: Secondary | ICD-10-CM | POA: Insufficient documentation

## 2016-07-23 DIAGNOSIS — R4189 Other symptoms and signs involving cognitive functions and awareness: Secondary | ICD-10-CM | POA: Diagnosis not present

## 2016-07-23 DIAGNOSIS — F329 Major depressive disorder, single episode, unspecified: Secondary | ICD-10-CM | POA: Insufficient documentation

## 2016-07-23 DIAGNOSIS — S069X1S Unspecified intracranial injury with loss of consciousness of 30 minutes or less, sequela: Secondary | ICD-10-CM | POA: Diagnosis not present

## 2016-07-23 DIAGNOSIS — M1990 Unspecified osteoarthritis, unspecified site: Secondary | ICD-10-CM | POA: Diagnosis not present

## 2016-07-23 DIAGNOSIS — Z836 Family history of other diseases of the respiratory system: Secondary | ICD-10-CM | POA: Insufficient documentation

## 2016-07-23 DIAGNOSIS — G8929 Other chronic pain: Secondary | ICD-10-CM | POA: Diagnosis not present

## 2016-07-23 DIAGNOSIS — I639 Cerebral infarction, unspecified: Secondary | ICD-10-CM

## 2016-07-23 DIAGNOSIS — I110 Hypertensive heart disease with heart failure: Secondary | ICD-10-CM | POA: Insufficient documentation

## 2016-07-23 DIAGNOSIS — M961 Postlaminectomy syndrome, not elsewhere classified: Secondary | ICD-10-CM | POA: Diagnosis not present

## 2016-07-23 DIAGNOSIS — S069X3S Unspecified intracranial injury with loss of consciousness of 1 hour to 5 hours 59 minutes, sequela: Secondary | ICD-10-CM | POA: Diagnosis not present

## 2016-07-23 MED ORDER — QUETIAPINE FUMARATE 25 MG PO TABS: 25.0000 mg | ORAL_TABLET | Freq: Every day | ORAL | 2 refills | Status: DC

## 2016-07-23 NOTE — Patient Instructions (Signed)
WORK ON SLEEP HYGIENE  TRY TO GO TO SLEEP AT A CONSISTENT TIME  (PERHAPS BETWEEN 9-11 PM) USE BACKGROUND MUSIC OR NOISE TRY READING A BOOK OR MAGAZINE TO HELP YOU RELAX AVOID "OVERSLEEPING" IN THE MORNING---TRY TO GET BACK ON MORE OF A NORMAL SCHEDULE.    PLEASE CALL ME WITH ANY PROBLEMS OR QUESTIONS [phone])

## 2016-07-23 NOTE — Progress Notes (Signed)
Subjective:    Patient ID: Lisa [name], female    DOB: 10/07/1926, 82 y.o.   MRN: [id number]  HPI   Mrs. Woolsey is here in follow up of her TBI. She has been doing well except for her low back which has been acting up lately.  Tylenol helps a little bit as does sitting down when the pain flares.   Most recent CT myelogram from 2015 was as follows:  L2-L3: Central and leftward protrusion is partially calcified. Moderate disc space narrowing is worse on the left. Advanced facet arthropathy and ligamentum flavum hypertrophy. Disc material narrows the foramen on the left. Mild central canal stenosis. Left greater than right L2 and L3 nerve root impingement.  L3-L4: Central protrusion. Moderate facet and ligamentum flavum hypertrophy. Mild central canal stenosis. No definite L4 nerve root impingement.  L4-L5: 4 mm facet mediated/ postsurgical anterolisthesis. Mild annular bulging. Asymmetric loss of interspace height on the right with lateral spurring. Right greater than left L4 and L5 nerve root impingement.  L5-S1: Severe disc space narrowing. Osseous spurring extends to both sides, worse on the right. Mild facet arthropathy. Central protrusion is partially calcified. No definite S1 nerve root displacement in the canal. The bilateral L5 nerve roots right greater than left could be compressed in the extraforaminal soft Tissues.  Her sleep has become an issue again. She often doesn't fall asleep until 0300 and says she feels restless. She will sleep then utnil 10am in the morning.   She has been by herself in the morning for 2 hours at a time without any issues.    Pain Inventory Average Pain 5 Pain Right Now 3 My pain is aching  In the last 24 hours, has pain interfered with the following? General activity 5 Relation with others n/a Enjoyment of life n/a What TIME of day is your pain at its worst? evening Sleep (in general) Poor  Pain is worse with: standing Pain  improves with: rest Relief from Meds: fair  Mobility use a walker  Function not employed: date last employed n/a I need assistance with the following:  household duties and shopping  Neuro/Psych No problems in this area  Prior Studies Any changes since last visit?  no  Physicians involved in your care Any changes since last visit?  no   Family History  Problem Relation Age of Onset  . COPD Mother   . COPD Father   . Heart disease Father    Social History   Social History  . Marital status: Widowed    Spouse name: N/A  . Number of children: N/A  . Years of education: N/A   Occupational History  . retired    Social History Main Topics  . Smoking status: Never Smoker  . Smokeless tobacco: Never Used  . Alcohol use No  . Drug use: No  . Sexual activity: Not Asked   Other Topics Concern  . None   Social History Narrative  . None   Past Surgical History:  Procedure Laterality Date  . BACK SURGERY N/A   . BREAST LUMPECTOMY N/A    Past Medical History:  Diagnosis Date  . Allergy   . Brain bleed (HCC)   . Chronic systolic CHF (congestive heart failure) (HCC)   . Depression   . Foot fracture    bilateral  . Hypertension   . Osteoarthritis   . Pelvis fracture (HCC)   . Renal disorder   . Spinal stenosis   . V-tach (  HCC)   . Wrist fracture, right    BP  178/75   Pulse 78   Resp 14   SpO2 98%   Opioid Risk Score:   Fall Risk Score:  `1  Depression screen PHQ 2/9  [facility] 10/22/2015 07/24/2015 07/16/2015 06/26/2015 05/22/2015 04/23/2015 04/02/2015  Decreased Interest 0 0 0 0 0 0 0  Down, Depressed, Hopeless 0 0 0 0 0 0 0  PHQ - 2 Score 0 0 0 0 0 0 0  Altered sleeping 0 - - - - - -  Tired, decreased energy 0 - - - - - -  Change in appetite 0 - - - - - -  Feeling bad or failure about yourself  0 - - - - - -  Trouble concentrating 0 - - - - - -  Moving slowly or fidgety/restless 0 - - - - - -  Suicidal thoughts 0 - - - - - -  PHQ-9  Score 0 - - - - - -     Review of Systems  All other systems reviewed and are negative.      Objective:   Physical Exam  HENT:  Head: Normocephalic and atraumatic.  Right Ear: No decreased hearing is noted.  Left Ear: No decreased hearing is noted.  Mouth/Throat: oral membranes moist Eyes: Conjunctivae are normal. Pupils are equal, round, and reactive to light.  Neck: Normal range of motion. Neck supple.  Cardiovascular: Normal rate and regular rhythm. trace edema either leg  Respiratory: Effort normal and breath sounds normal. No respiratory distress. She has no wheezes.  GI: Soft. Bowel sounds are normal. She exhibits no distension. There is no tenderness. There is no rebound.  Musculoskeletal: low back tender with truncal movement and palpation. Balance much better. Uses walker safely.  Neurological:   Normal speech. Normal awareness and insight  Skin: Skin is warm and dry.    Motor 5/5 UE's. LE's 4/5 at hips/knees partly due to pain. 5/5 ankles. Excellent balance with her RW. Transfers easily and changes directions without problems.  Sensation intact to light touch in bilateral upper and lower limbs.   Skin:intact without breakdown   Assessment & Plan:   Medical Problem List and Plan:  1. 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 Recent TIA.  -She can be alone for more than 2 hours---5-6 hours is probably  -she can do stairs if she practices climbing them before her trip 3. 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 [facility]    4. Mood: LCSW to follow for evaluation and support.              -seroquel for sleep as below 5. Neuropsych:  -at baseline---doing quite well -can be by herself more at home ---probably 5-6 hours per day at this point. I 'll leave this up to her family. -increase seroquel to  25-50mg  qhs for sleep  -discussed proprer sleep hygiene. Plan/outline provided 6. Fluids/Electrolytes/Nutrition: off intake  7. Shocked heart syndrome with wide complex tachycardia/chronic CHF:  -follow up with [facility]   -  8. Depression: celexa   Follow up with me in 2 months. Thirty minutes of face to face patient care time were spent during this visit. All questions were encouraged and answered.

## 2016-08-11 ENCOUNTER — Other Ambulatory Visit: Payer: Self-pay

## 2016-08-11 MED ORDER — APIXABAN 2.5 MG PO TABS: 2.5000 mg | ORAL_TABLET | Freq: Two times a day (BID) | ORAL | 3 refills | Status: DC

## 2016-09-23 ENCOUNTER — Encounter: Payer: Medicare Other | Attending: Physical Medicine & Rehabilitation | Admitting: Physical Medicine & Rehabilitation

## 2016-09-23 ENCOUNTER — Encounter: Payer: Self-pay | Admitting: Physical Medicine & Rehabilitation

## 2016-09-23 VITALS — BP 145/69 | HR 77 | Resp 16

## 2016-09-23 DIAGNOSIS — Z836 Family history of other diseases of the respiratory system: Secondary | ICD-10-CM | POA: Insufficient documentation

## 2016-09-23 DIAGNOSIS — M5136 Other intervertebral disc degeneration, lumbar region: Secondary | ICD-10-CM

## 2016-09-23 DIAGNOSIS — M961 Postlaminectomy syndrome, not elsewhere classified: Secondary | ICD-10-CM | POA: Insufficient documentation

## 2016-09-23 DIAGNOSIS — F5105 Insomnia due to other mental disorder: Secondary | ICD-10-CM | POA: Diagnosis not present

## 2016-09-23 DIAGNOSIS — S065X9S Traumatic subdural hemorrhage with loss of consciousness of unspecified duration, sequela: Secondary | ICD-10-CM | POA: Diagnosis not present

## 2016-09-23 DIAGNOSIS — M25511 Pain in right shoulder: Secondary | ICD-10-CM

## 2016-09-23 DIAGNOSIS — Z8249 Family history of ischemic heart disease and other diseases of the circulatory system: Secondary | ICD-10-CM | POA: Diagnosis not present

## 2016-09-23 DIAGNOSIS — Z9889 Other specified postprocedural states: Secondary | ICD-10-CM | POA: Diagnosis not present

## 2016-09-23 DIAGNOSIS — S069X3S Unspecified intracranial injury with loss of consciousness of 1 hour to 5 hours 59 minutes, sequela: Secondary | ICD-10-CM

## 2016-09-23 DIAGNOSIS — F409 Phobic anxiety disorder, unspecified: Secondary | ICD-10-CM | POA: Insufficient documentation

## 2016-09-23 DIAGNOSIS — Z8673 Personal history of transient ischemic attack (TIA), and cerebral infarction without residual deficits: Secondary | ICD-10-CM | POA: Diagnosis not present

## 2016-09-23 DIAGNOSIS — S066X9S Traumatic subarachnoid hemorrhage with loss of consciousness of unspecified duration, sequela: Secondary | ICD-10-CM | POA: Diagnosis not present

## 2016-09-23 DIAGNOSIS — M1990 Unspecified osteoarthritis, unspecified site: Secondary | ICD-10-CM | POA: Diagnosis not present

## 2016-09-23 DIAGNOSIS — M47816 Spondylosis without myelopathy or radiculopathy, lumbar region: Secondary | ICD-10-CM

## 2016-09-23 DIAGNOSIS — R4189 Other symptoms and signs involving cognitive functions and awareness: Secondary | ICD-10-CM | POA: Insufficient documentation

## 2016-09-23 DIAGNOSIS — R269 Unspecified abnormalities of gait and mobility: Secondary | ICD-10-CM | POA: Insufficient documentation

## 2016-09-23 DIAGNOSIS — F329 Major depressive disorder, single episode, unspecified: Secondary | ICD-10-CM | POA: Diagnosis not present

## 2016-09-23 DIAGNOSIS — I110 Hypertensive heart disease with heart failure: Secondary | ICD-10-CM | POA: Diagnosis not present

## 2016-09-23 DIAGNOSIS — G8929 Other chronic pain: Secondary | ICD-10-CM | POA: Insufficient documentation

## 2016-09-23 DIAGNOSIS — I509 Heart failure, unspecified: Secondary | ICD-10-CM | POA: Insufficient documentation

## 2016-09-23 DIAGNOSIS — M1288 Other specific arthropathies, not elsewhere classified, other specified site: Secondary | ICD-10-CM

## 2016-09-23 DIAGNOSIS — Z87820 Personal history of traumatic brain injury: Secondary | ICD-10-CM | POA: Diagnosis not present

## 2016-09-23 DIAGNOSIS — S069X1S Unspecified intracranial injury with loss of consciousness of 30 minutes or less, sequela: Secondary | ICD-10-CM

## 2016-09-23 MED ORDER — QUETIAPINE FUMARATE 25 MG PO TABS: 25.0000 mg | ORAL_TABLET | Freq: Every day | ORAL | 2 refills | Status: DC

## 2016-09-23 NOTE — Patient Instructions (Signed)
PLEASE CALL ME WITH ANY PROBLEMS OR QUESTIONS ([phone])  

## 2016-09-23 NOTE — Progress Notes (Signed)
Subjective:    Patient ID: Lisa [name], female    DOB: 1926-12-17, 83 y.o.   MRN: [id number]  HPI   Lisa [name] is here in follow up of her TBI. She had a good holiday. Family was visiting over the holiday which was nice. The cold weather has caused some increased pain in her right shoulder. The shoulder is tender with abduction and with overhead activities as well. She uses tylenol to help with pain.  She is also still struggling with sleep. She has had increased difficulties with falling to sleep the last two weeks. She has used the seroquel to help with sleep but it "takes a little bit to take effect.".     Pain Inventory Average Pain 5 Pain Right Now 3 My pain is dull  In the last 24 hours, has pain interfered with the following? General activity 5 Relation with others 5 Enjoyment of life 6 What TIME of day is your pain at its worst? daytime Sleep (in general) Fair  Pain is worse with: standing Pain improves with: rest and medication Relief from Meds: no selection  Mobility walk with assistance use a walker ability to climb steps?  no do you drive?  no  Function retired I need assistance with the following:  household duties  Neuro/Psych trouble walking  Prior Studies Any changes since last visit?  no  Physicians involved in your care Any changes since last visit?  no   Family History  Problem Relation Age of Onset  . COPD Mother   . COPD Father   . Heart disease Father    Social History   Social History  . Marital status: Widowed    Spouse name: N/A  . Number of children: N/A  . Years of education: N/A   Occupational History  . retired    Social History Main Topics  . Smoking status: Never Smoker  . Smokeless tobacco: Never Used  . Alcohol use No  . Drug use: No  . Sexual activity: Not Asked   Other Topics Concern  . None   Social History Narrative  . None   Past Surgical History:  Procedure Laterality Date  . BACK SURGERY N/A   .  BREAST LUMPECTOMY N/A    Past Medical History:  Diagnosis Date  . Allergy   . Brain bleed (HCC)   . Chronic systolic CHF (congestive heart failure) (HCC)   . Depression   . Foot fracture    bilateral  . Hypertension   . Osteoarthritis   . Pelvis fracture (HCC)   . Renal disorder   . Spinal stenosis   . V-tach (HCC)   . Wrist fracture, right    BP (!) 145/69   Pulse 77   Resp 16   SpO2 94%   Opioid Risk Score:   Fall Risk Score:  `1  Depression screen PHQ 2/9  Depression screen [facility] 2/9 10/22/2015 07/24/2015 07/16/2015 06/26/2015 05/22/2015 04/23/2015 04/02/2015  Decreased Interest 0 0 0 0 0 0 0  Down, Depressed, Hopeless 0 0 0 0 0 0 0  PHQ - 2 Score 0 0 0 0 0 0 0  Altered sleeping 0 - - - - - -  Tired, decreased energy 0 - - - - - -  Change in appetite 0 - - - - - -  Feeling bad or failure about yourself  0 - - - - - -  Trouble concentrating 0 - - - - - -  Moving slowly  or fidgety/restless 0 - - - - - -  Suicidal thoughts 0 - - - - - -  PHQ-9 Score 0 - - - - - -    Review of Systems  Constitutional: Positive for unexpected weight change.  HENT: Negative.   Eyes: Negative.   Respiratory: Positive for wheezing.   Cardiovascular: Positive for leg swelling.  Gastrointestinal: Negative.   Endocrine: Negative.   Genitourinary: Negative.   Musculoskeletal: Positive for arthralgias and back pain.  Allergic/Immunologic: Negative.   Neurological: Negative.   Hematological: Bruises/bleeds easily.  Psychiatric/Behavioral: Negative.        Objective:   Physical Exam HENT:  Head: Normocephalic and atraumatic.  Right Ear: No decreased hearing is noted.  Left Ear: No decreased hearing is noted.  Mouth/Throat: oral membranes moist Eyes: Conjunctivae are normal. Pupils are equal, round, and reactive to light.  Neck: Normal range of motion. Neck supple.  Cardiovascular: RRR  Respiratory: CTA B.  GI: Soft. Bowel sounds are normal. She exhibits no distension. There is no  tenderness. There is no rebound.  Musculoskeletal: low back tender with truncal movement and palpation. Right shoulder with +impingement signs. Has pain with abduction and reaching behind back.   Neurological:   Normal speech. Normal awareness and insight. Balance functional with walker. Skin: Skin is warm and dry.    Motor 5/5 UE's. LE's 4/5 at hips/knees partly due to pain. 5/5 ankles. Excellent balance with her RW. Transfers easily and changes directions without problems.  Sensation intact to light touch in bilateral upper and lower limbs.   Skin: intact without breakdown    Assessment & Plan:  Medical Problem List and Plan: 1. 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 Recent TIA.   3. 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 [facility]    4. Mood: LCSW to follow for evaluation and support.  -seroquel for sleep as below 5. Sleep hygiene: -discussed proprer sleep hygiene.  -seroquel prn 1-2 hours prior to bed, can use on schedule for the next wk or two until she re-establishes more consistent cycle. 6. Right shoulder pain: likely RTC tendonitis. Exercises were provided and reviewed. May apply heat. Utilize tylenol as well.   7. Depression: celexa   Follow up with me in 4 months. 15 minutes of face to face patient care time were spent during this visit. All questions were encouraged and answered.

## 2016-10-09 ENCOUNTER — Ambulatory Visit: Payer: Medicare Other | Admitting: Cardiovascular Disease

## 2016-11-02 NOTE — Progress Notes (Signed)
Cardiology Office Note   Date:  11/03/2016   ID:  Lisa [name], DOB Aug 11, 1927, MRN [id number]  PCP:  Rozanna Box, MD  Cardiologist:   Lorine Bears, MD   Chief Complaint  Patient presents with  . other    4 month follow up. Meds reviewed by the pt. verbally. Pt. c/o increase weight gain, swelling in legs.        History of Present Illness: Lisa [name] is a 83 y.o. female who presents for a follow-up visit regarding chronic systolic heart failure , LBBB and paroxysmal atrial fibrillation.   She presented in November 2016 with subdural and subarachnoid hemorrhage after a fall. She suffered cardiac arrest at that time due to ventricular tachycardia. Troponin was mildly elevated. Echocardiogram showed an ejection fraction of 15-20% with akinesis of the entire anteroseptal myocardium with moderate pulmonary hypertension. She was suspected of having stress-induced cardiomyopathy and was managed conservatively.  A follow-up echocardiogram in February 2017 showed an ejection fraction of 35-40% with severe anteroseptal hypokinesis. She had atrial fibrillation in the setting of acute illness and has been on amiodarone and anticoagulation. She has been doing reasonably well and denies any chest pain or worsening dyspnea. She complains of worsening leg edema and continued weight gain in spite of decreasing carbohydrate intake and eating more healthy. During last visit, her labs showed mildly elevated TSH. She was started on small dose of levothyroxin by her primary care physician. She does have chronic kidney disease and most recent creatinine was around 2.   Past Medical History:  Diagnosis Date  . Allergy   . Brain bleed (HCC)   . Chronic systolic CHF (congestive heart failure) (HCC)   . Depression   . Foot fracture    bilateral  . Hypertension   . Osteoarthritis   . Pelvis fracture (HCC)   . Renal disorder   . Spinal stenosis   . V-tach (HCC)   . Wrist fracture, right      Past Surgical History:  Procedure Laterality Date  . BACK SURGERY N/A   . BREAST LUMPECTOMY N/A      Current Outpatient Prescriptions  Medication Sig Dispense Refill  . acetaminophen (TYLENOL) 325 MG tablet Take 1-2 tablets (325-650 mg total) by mouth every 4 (four) hours as needed for mild pain.    [name] amiodarone (PACERONE) 200 MG tablet Take 1 tablet (200 mg total) by mouth daily. 30 tablet 3  . apixaban (ELIQUIS) 2.5 MG TABS tablet Take 1 tablet (2.5 mg total) by mouth 2 (two) times daily. 60 tablet 3  . citalopram (CELEXA) 20 MG tablet   4  . irbesartan (AVAPRO) 150 MG tablet Take 150 mg by mouth daily.    [name] levothyroxine (SYNTHROID, LEVOTHROID) 25 MCG tablet Take 25 mcg by mouth daily.    . metoprolol tartrate (LOPRESSOR) 25 MG tablet Take 12.5 mg by mouth 2 (two) times daily.   4  . mirtazapine (REMERON) 15 MG tablet   4  . omeprazole (PRILOSEC) 20 MG capsule Take 1 capsule by mouth daily.  10  . QUEtiapine (SEROQUEL) 25 MG tablet Take 1-2 tablets (25-50 mg total) by mouth at bedtime. 60 tablet 2  . raloxifene (EVISTA) 60 MG tablet Take 1 tablet by mouth daily.    [name] senna-docusate (SENOKOT-S) 8.6-50 MG tablet Take 2 tablets by mouth 2 (two) times daily.     No current facility-administered medications for this visit.     Allergies:   Ambien [zolpidem  tartrate]; Daypro [oxaprozin]; Oxycodone; Shellfish allergy; Ace inhibitors; and Norvasc [amlodipine besylate]    Social History:  The patient  reports that she has never smoked. She has never used smokeless tobacco. She reports that she does not drink alcohol or use drugs.   Family History:  [name] family history includes COPD in her father and mother; Heart disease in her father.    ROS:  Please see the history of present illness.   Otherwise, review of systems are positive for none.   All other systems are reviewed and negative.    PHYSICAL EXAM: VS:  BP (!) 140/56 (BP Location: Left Arm, Patient Position:  Sitting, Cuff Size: Normal)   Pulse 75   Ht 5\' 3"  (1.6 m)   Wt 184 lb 4 oz (83.6 kg)   BMI 32.64 kg/m  , BMI Body mass index is 32.64 kg/m. GEN: Well nourished, well developed, in no acute distress  HEENT: normal  Neck: no JVD, carotid bruits, or masses Cardiac: RRR; no murmurs, rubs, or gallops,Mild edema  Respiratory:  clear to auscultation bilaterally, normal work of breathing GI: soft, nontender, nondistended, + BS MS: no deformity or atrophy  Skin: warm and dry, no rash Neuro:  Strength and sensation are intact Psych: euthymic mood, full affect   EKG:  EKG is ordered today. The ekg ordered today demonstrates normal sinus rhythm with left bundle branch block.   Recent Labs: 12/13/2015: B Natriuretic Peptide 48.0; Hemoglobin 10.6 06/09/2016: ALT 18; BUN 30; Creatinine, Ser 2.30; Platelets 305; Potassium 6.2; Sodium 141; TSH 6.350    Lipid Panel    Component Value Date/Time   CHOL 174 12/10/2015 0537   TRIG 115 12/10/2015 0537   HDL 44 12/10/2015 0537   CHOLHDL 4.0 12/10/2015 0537   VLDL 23 12/10/2015 0537   LDLCALC 107 (H) 12/10/2015 0537      Wt Readings from Last 3 Encounters:  11/03/16 184 lb 4 oz (83.6 kg)  06/09/16 171 lb 8 oz (77.8 kg)  12/28/15 149 lb 8 oz (67.8 kg)        ASSESSMENT AND PLAN:  1.  Chronic systolic heart failure: Due to stress-induced cardiomyopathy versus ischemic cardiomyopathy. Continue treatment with small dose metoprolol and ARB.  She appears to be mildly volume overloaded and her weight continues to increase. Thus, I added small dose furosemide 20 mg once daily. Check follow-up labs in one week.  2. Paroxysmal atrial fibrillation: She is currently in normal sinus rhythm.  No evidence of recurrent atrial fibrillation. I decreased amiodarone to 100 mg once daily. Continue anticoagulation with Eliquis. She was noted to be mildly anemic but she reports no obvious bleeding.  3.  Obesity and continued weight gain: She seems to be  frustrated by this as she has been trying to do better with diet. I think she is to consuming too much carbohydrates as she likes rice and bread. I will see if we can refer her to a nutritionist for education.  Disposition:   FU with me in 4 months  Signed,  Lorine BearsMuhammad Arida, MD  11/03/2016 11:11 AM    [facility]

## 2016-11-03 ENCOUNTER — Encounter: Payer: Self-pay | Admitting: Cardiovascular Disease

## 2016-11-03 ENCOUNTER — Ambulatory Visit (INDEPENDENT_AMBULATORY_CARE_PROVIDER_SITE_OTHER): Payer: Medicare Other | Admitting: Cardiovascular Disease

## 2016-11-03 VITALS — BP 140/56 | HR 75 | Ht 63.0 in | Wt 184.2 lb

## 2016-11-03 DIAGNOSIS — I420 Dilated cardiomyopathy: Secondary | ICD-10-CM | POA: Diagnosis not present

## 2016-11-03 DIAGNOSIS — I480 Paroxysmal atrial fibrillation: Secondary | ICD-10-CM | POA: Diagnosis not present

## 2016-11-03 DIAGNOSIS — R635 Abnormal weight gain: Secondary | ICD-10-CM | POA: Diagnosis not present

## 2016-11-03 DIAGNOSIS — Z79899 Other long term (current) drug therapy: Secondary | ICD-10-CM

## 2016-11-03 DIAGNOSIS — I5022 Chronic systolic (congestive) heart failure: Secondary | ICD-10-CM

## 2016-11-03 MED ORDER — FUROSEMIDE 20 MG PO TABS: 20.0000 mg | ORAL_TABLET | Freq: Every day | ORAL | 3 refills | Status: DC

## 2016-11-03 MED ORDER — AMIODARONE HCL 100 MG PO TABS: 100.0000 mg | ORAL_TABLET | Freq: Every day | ORAL | 3 refills | Status: DC

## 2016-11-03 NOTE — Patient Instructions (Signed)
Medication Instructions:  Your physician has recommended you make the following change in your medication:  1- DECREASE Amiodarone to 100 mg by mouth once a day. 2- START Furosemide 20 mg (1 tablet) by mouth once a day.   Labwork: Your physician recommends that you return for lab work in: 1 WEEK. - [facility] You will check in at the front desk to the right as you walk into the atrium. Valet Parking is offered if needed. ON February 19 or 20, 2018   Testing/Procedures: none   Follow-Up: Your physician wants you to follow-up in: 4 MONTHS WITH DR ARIDA. You will receive a reminder letter in the mail two months in advance. If you don't receive a letter, please call our office to schedule the follow-up appointment.  You have been referred to [facility]UTRITION[facility]. SOMEONE WILL CALL YOU TO SCHEDULE.   If you need a refill on your cardiac medications before your next appointment, please call your pharmacy.

## 2016-11-09 IMAGING — DX DG CHEST 1V PORT
1 series · 1 of 1 positions shown · non-contrast
Comparison: Chest x-ray dated 02/21/2010

CLINICAL DATA: Acute respiratory failure with hypoxemia.

EXAM:
PORTABLE CHEST 1 VIEW

[chest ap]
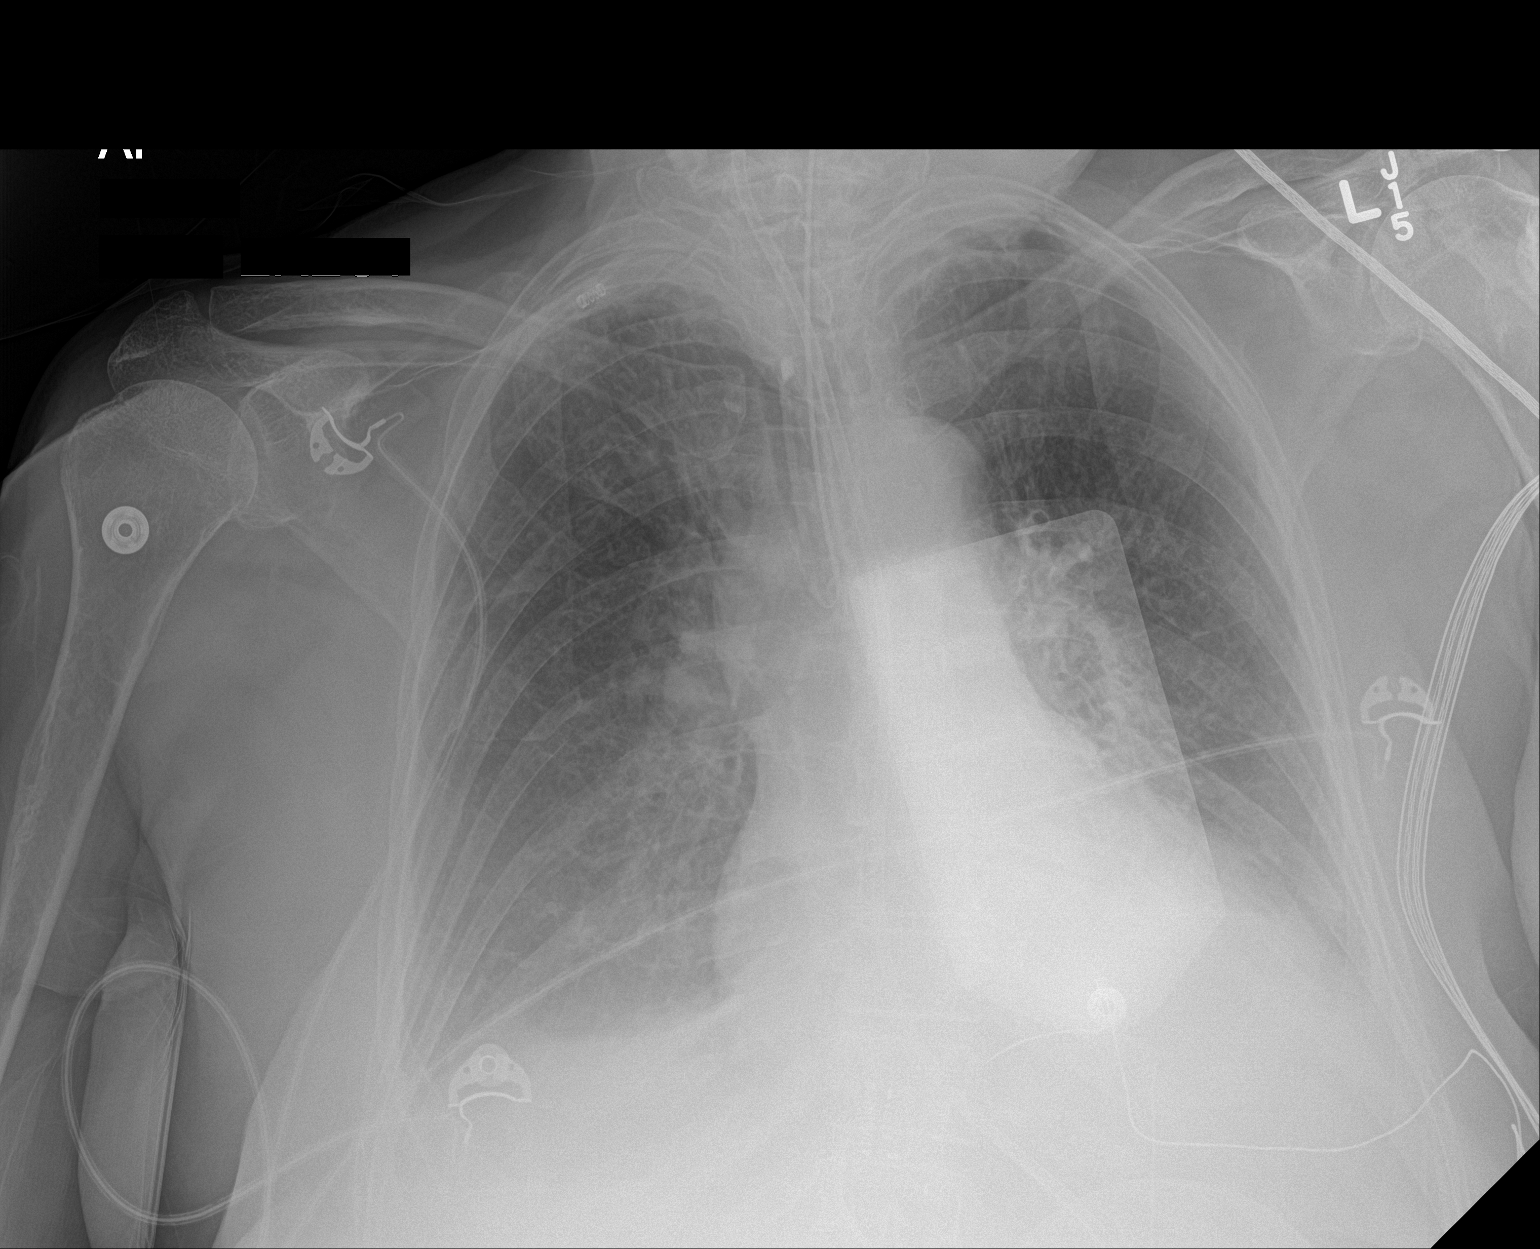

[1 of 1 positions shown; findings below may reference images not displayed]

FINDINGS: Endotracheal tube is at the origin of the right mainstem bronchus
and needs to be retracted 3-4 cm. Feeding tube is below the
diaphragm. The patient has developed small bilateral pleural
effusions. Heart size and pulmonary vascularity are normal.
IMPRESSION: 1. The ET tube in the proximal right mainstem bronchus.
2. Small bilateral pleural effusions.
3. Critical Value/emergent results were called by telephone at the
time of interpretation on 08/08/2015 at [DATE] to Hamideh, RN, who
verbally acknowledged these results.

## 2016-11-10 IMAGING — CR DG CHEST 1V PORT
1 series · 1 of 1 positions shown · non-contrast
Comparison: Portable chest x-ray Saturday August, 2015

CLINICAL DATA: Acute respiratory failure, hypoxia, intubated
patient; subdural and subarachnoid hemorrhage.

EXAM:
PORTABLE CHEST 1 VIEW

[AP]
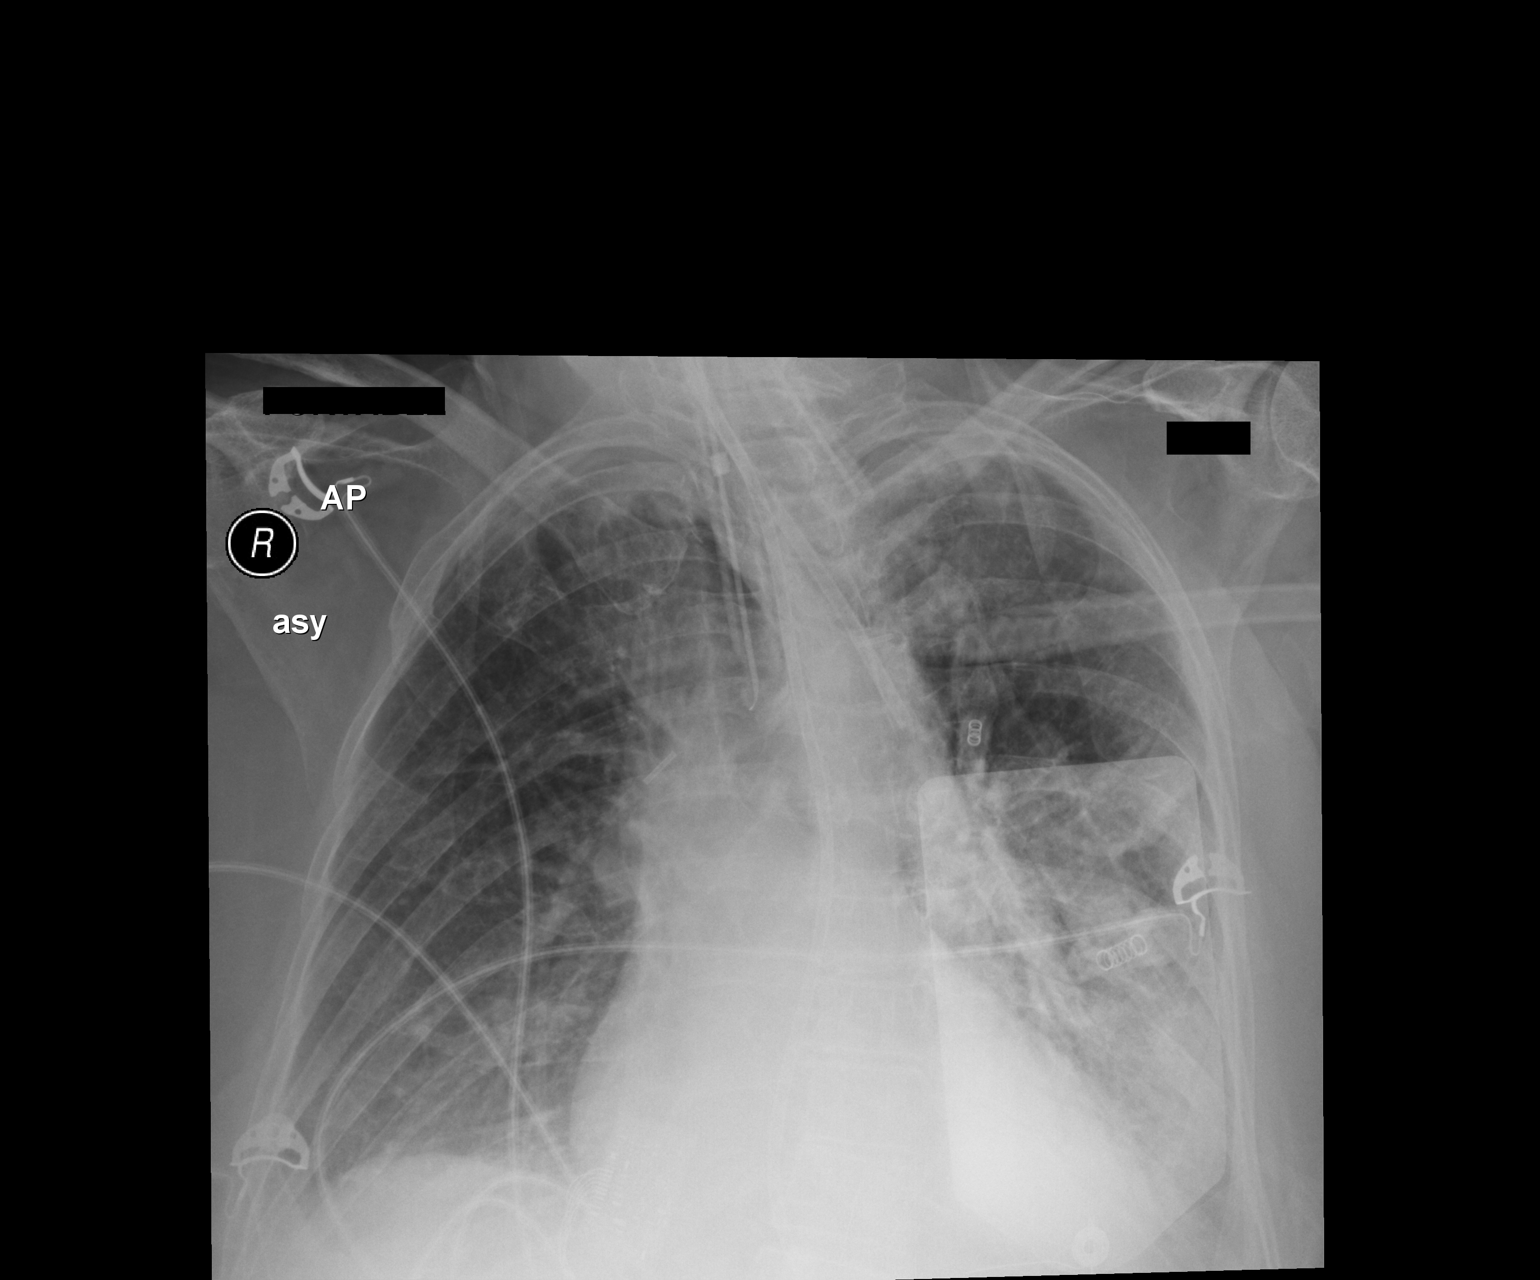

[1 of 1 positions shown; findings below may reference images not displayed]

FINDINGS: The lungs are adequately inflated. The interstitial markings are
more prominent today bilaterally. The left lower lobe region is more
dense. The heart is top-normal in size. The pulmonary vascularity is
not engorged. The endotracheal tube has been repositioned and now
its tip lies 2.8 cm above the carina. The feeding tube tip projects
below the inferior margin of the image. An external
pacemaker-defibrillation pad is present over the lower left lung.
IMPRESSION: Slight interval deterioration in the appearance of the pulmonary
interstitium worrisome for mild interstitial edema. Left lower lobe
atelectasis is slightly more conspicuous today. The support tubes
are in reasonable position.

## 2016-11-11 ENCOUNTER — Other Ambulatory Visit
Admission: RE | Admit: 2016-11-11 | Discharge: 2016-11-11 | Disposition: A | Discharge status: Home / Self Care | Payer: Medicare Other | Source: Ambulatory Visit | Attending: Cardiovascular Disease | Admitting: Cardiovascular Disease

## 2016-11-11 DIAGNOSIS — I420 Dilated cardiomyopathy: Secondary | ICD-10-CM | POA: Insufficient documentation

## 2016-11-11 DIAGNOSIS — Z79899 Other long term (current) drug therapy: Secondary | ICD-10-CM | POA: Diagnosis present

## 2016-11-11 DIAGNOSIS — I5022 Chronic systolic (congestive) heart failure: Secondary | ICD-10-CM | POA: Insufficient documentation

## 2016-11-11 LAB — CBC WITH DIFFERENTIAL/PLATELET
BASOS PCT: 4 %
Basophils Absolute: 0.3 10*3/uL — ABNORMAL HIGH (ref 0–0.1)
Eosinophils Absolute: 0.3 10*3/uL (ref 0–0.7)
Eosinophils Relative: 4 %
HEMATOCRIT: 27.7 % — AB (ref 35.0–47.0)
HEMOGLOBIN: 9 g/dL — AB (ref 12.0–16.0)
LYMPHS PCT: 17 %
Lymphs Abs: 1.1 10*3/uL (ref 1.0–3.6)
MCH: 27.3 pg (ref 26.0–34.0)
MCHC: 32.4 g/dL (ref 32.0–36.0)
MCV: 84.3 fL (ref 80.0–100.0)
MONOS PCT: 9 %
Monocytes Absolute: 0.6 10*3/uL (ref 0.2–0.9)
NEUTROS ABS: 4.5 10*3/uL (ref 1.4–6.5)
NEUTROS PCT: 66 %
Platelets: 267 10*3/uL (ref 150–440)
RBC: 3.29 MIL/uL — ABNORMAL LOW (ref 3.80–5.20)
RDW: 17.9 % — ABNORMAL HIGH (ref 11.5–14.5)
WBC: 6.8 10*3/uL (ref 3.6–11.0)

## 2016-11-11 LAB — BASIC METABOLIC PANEL
ANION GAP: 9 (ref 5–15)
BUN: 24 mg/dL — ABNORMAL HIGH (ref 6–20)
CALCIUM: 9.1 mg/dL (ref 8.9–10.3)
CHLORIDE: 108 mmol/L (ref 101–111)
CO2: 24 mmol/L (ref 22–32)
Creatinine, Ser: 2.21 mg/dL — ABNORMAL HIGH (ref 0.44–1.00)
GFR calc non Af Amer: 19 mL/min — ABNORMAL LOW (ref >60)
GFR, EST AFRICAN AMERICAN: 22 mL/min — AB (ref >60)
Glucose, Bld: 136 mg/dL — ABNORMAL HIGH (ref 65–99)
POTASSIUM: 4.1 mmol/L (ref 3.5–5.1)
Sodium: 141 mmol/L (ref 135–145)

## 2016-11-14 ENCOUNTER — Encounter: Payer: Medicare Other | Attending: Cardiovascular Disease | Admitting: Dietician

## 2016-11-14 ENCOUNTER — Encounter: Payer: Self-pay | Admitting: Dietician

## 2016-11-14 VITALS — Ht 63.0 in | Wt 180.0 lb

## 2016-11-14 DIAGNOSIS — R635 Abnormal weight gain: Secondary | ICD-10-CM | POA: Diagnosis not present

## 2016-11-14 DIAGNOSIS — Z713 Dietary counseling and surveillance: Secondary | ICD-10-CM | POA: Diagnosis present

## 2016-11-14 DIAGNOSIS — Z6831 Body mass index (BMI) 31.0-31.9, adult: Secondary | ICD-10-CM

## 2016-11-14 DIAGNOSIS — E669 Obesity, unspecified: Secondary | ICD-10-CM

## 2016-11-14 NOTE — Progress Notes (Signed)
Medical Nutrition Therapy: Visit start time: 1100  end time: 1200  Assessment:  Diagnosis: weight gain Past medical history: hypernatremia, pressure ulcer, pneumonia, CHF, hypokalemia, dysphagia, hypomagnesemia, depression, TIA, dementia Psychosocial issues/ stress concerns: none  Preferred learning method:  . Visual  Current weight: 180lb  Height: 5\' 3"  Medications, supplements: MVI + Minerals, Vit D3, Lasix  Progress and evaluation: Patient would like to discuss undesired weight gain due to increased swelling to BLE. Was started on lasix 1 week ago but no changes to swelling have yet been seen. Does not elevate her legs unless in bed. Dx of CHF but does not necessarily follow a low sodium diet. Cooks with salt but does not add additional salt to foods. Tries not to order high sodium foods when out to eat, also tries to make "smart choices" with meal selections. Does not read nutrition labels for sodium content. Eats frozen meals occasionally but does not usually buy frozen or canned products. Has swapped regular potato chips for "popables" chips which are lower in calories and slightly lower in sodium. Overall diet is fairly healthy- high intake of fruits and vegetables, moderate intake of protein. Uses butter to cook only and chooses lower fat dairy and condiments. States she does not eat large portions. RD encouraged patient to continue to drink adequate fluids throughout the day. Also helped patient and caregiver identify hidden sources of sodium, how to look for low sodium options, ideas for cooking without salt. Reviewed what qualifies as "low sodium" per serving size when reading nutrition labels. Importance of including protein at meals and snacks was also discussed.  Physical activity: Walks around the house or outside  Dietary Intake:  Usual eating pattern includes 3 meals and 1-2 snacks per day. Dining out frequency: 4-5 meals per week.  Breakfast: cereal, breakfast bar, scrambled egg  /sausage toast - once a month if out to eat Snack: not usually Lunch: sweet potato, chicken, vegetables, beef stew, fish, pork occasionally Snack: not usually Supper: vegetables, 1/2 piece of cheese toast, [employer] bread soup, chicken and fried rice, chicken and sweet potato Snack: fruit popsicle, potato soup Beverages: coffee (1cup), 16oz water (between breakfast and lunch), tea + lemonade, occasional can of Gingerale  Nutrition Care Education: Topics covered: low sodium diet, healthy eating Basic nutrition: basic food groups, appropriate nutrient balance, appropriate meal and snack schedule, general nutrition guidelines    Weight control: benefits of weight control, behavioral changes for weight loss Advanced nutrition:  recipe modification, cooking techniques, dining out, food label reading Other lifestyle changes:  benefits of making changes, increasing motivation, readiness for change, identifying habits that need to change  Nutritional Diagnosis:  Clute-3.4 Unintentional weight gain As related to CHF.  As evidenced by fluid retention and BMI of 31.9.  Intervention: Discussion as noted above. Provided nutrition education materials on low sodium nutrition therapy and cooking without salt  Education Materials given:  . General diet guidelines for Low Sodium Diet r/t CHF . Sample meal pattern/ menus . Salt free cooking handout . Goals/ instructions  Learner/ who was taught:  . Patient . Caregiver/ guardian  Level of understanding: [name]. Verbalizes/ demonstrates competency  Demonstrated degree of understanding via:   Teach back Learning barriers: . None  Willingness to learn/ readiness for change: . Acceptance, ready for change  Monitoring and Evaluation:  Dietary intake, exercise, fluid intake, and body weight      follow up: in 4 week(s)

## 2016-11-14 NOTE — Patient Instructions (Addendum)
-   Try to limit salt intake. Cook with salt-free products/ seasonings, and try to buy foods that are low salt or salt-free- look for "50% less salt" "A low sodium choice", "low sodium"  - Continue to eat plenty of fruits and vegetables, great job!  - Add protein-rich snacks/meals throughout the day. Examples: cottage cheese + fruit, yogurt, nuts/ trail mix, eggs, peanut butter, meats  - Continue to drink plenty of fluids throughout the day

## 2016-12-01 ENCOUNTER — Telehealth: Payer: Self-pay | Admitting: Cardiovascular Disease

## 2016-12-01 NOTE — Telephone Encounter (Signed)
Pharmacist has a question regarding pt Amiodarone. Please call.

## 2016-12-01 NOTE — Telephone Encounter (Signed)
Spoke w/ Christine, RpH.  She wanted to clarify pt's amiodarone dosage, as she fills pt's pill boxes and was not aware that pt had a recent ov.  Advised her that Dr. Kirke CorinArida decreased pt's amiodarone to 100 mg once daily on 11/09/16. She is appreciative of the call.

## 2016-12-12 ENCOUNTER — Telehealth: Payer: Self-pay | Admitting: Dietician

## 2016-12-12 ENCOUNTER — Ambulatory Visit: Payer: Medicare Other | Admitting: Dietician

## 2016-12-12 NOTE — Telephone Encounter (Signed)
Patient to call back and reschedule her appointment should she feel she needs further help with nutrition related goals

## 2016-12-24 ENCOUNTER — Other Ambulatory Visit: Payer: Self-pay

## 2016-12-24 MED ORDER — APIXABAN 2.5 MG PO TABS: 2.5000 mg | ORAL_TABLET | Freq: Two times a day (BID) | ORAL | 3 refills | Status: DC

## 2016-12-24 NOTE — Telephone Encounter (Signed)
Requested Prescriptions   Signed Prescriptions Disp Refills  . apixaban (ELIQUIS) 2.5 MG TABS tablet 60 tablet 3    Sig: Take 1 tablet (2.5 mg total) by mouth 2 (two) times daily.    Authorizing Provider: Lorine Bears A    Ordering User: Margrett Rud

## 2017-01-12 ENCOUNTER — Other Ambulatory Visit: Payer: Self-pay | Admitting: Physical Medicine & Rehabilitation

## 2017-01-12 DIAGNOSIS — S069X3S Unspecified intracranial injury with loss of consciousness of 1 hour to 5 hours 59 minutes, sequela: Secondary | ICD-10-CM

## 2017-01-12 DIAGNOSIS — M961 Postlaminectomy syndrome, not elsewhere classified: Secondary | ICD-10-CM

## 2017-01-12 DIAGNOSIS — S069X1S Unspecified intracranial injury with loss of consciousness of 30 minutes or less, sequela: Secondary | ICD-10-CM

## 2017-01-21 ENCOUNTER — Encounter: Payer: Medicare Other | Attending: Physical Medicine & Rehabilitation | Admitting: Physical Medicine & Rehabilitation

## 2017-01-21 ENCOUNTER — Encounter: Payer: Self-pay | Admitting: Physical Medicine & Rehabilitation

## 2017-01-21 VITALS — BP 111/56 | HR 81

## 2017-01-21 DIAGNOSIS — R269 Unspecified abnormalities of gait and mobility: Secondary | ICD-10-CM | POA: Diagnosis present

## 2017-01-21 DIAGNOSIS — F5105 Insomnia due to other mental disorder: Secondary | ICD-10-CM | POA: Diagnosis not present

## 2017-01-21 DIAGNOSIS — M961 Postlaminectomy syndrome, not elsewhere classified: Secondary | ICD-10-CM

## 2017-01-21 DIAGNOSIS — M47816 Spondylosis without myelopathy or radiculopathy, lumbar region: Secondary | ICD-10-CM

## 2017-01-21 DIAGNOSIS — I110 Hypertensive heart disease with heart failure: Secondary | ICD-10-CM | POA: Diagnosis not present

## 2017-01-21 DIAGNOSIS — F409 Phobic anxiety disorder, unspecified: Secondary | ICD-10-CM

## 2017-01-21 DIAGNOSIS — G4700 Insomnia, unspecified: Secondary | ICD-10-CM | POA: Insufficient documentation

## 2017-01-21 DIAGNOSIS — M48061 Spinal stenosis, lumbar region without neurogenic claudication: Secondary | ICD-10-CM | POA: Insufficient documentation

## 2017-01-21 DIAGNOSIS — M1990 Unspecified osteoarthritis, unspecified site: Secondary | ICD-10-CM | POA: Diagnosis not present

## 2017-01-21 DIAGNOSIS — M5126 Other intervertebral disc displacement, lumbar region: Secondary | ICD-10-CM | POA: Insufficient documentation

## 2017-01-21 DIAGNOSIS — I5022 Chronic systolic (congestive) heart failure: Secondary | ICD-10-CM | POA: Diagnosis not present

## 2017-01-21 DIAGNOSIS — F329 Major depressive disorder, single episode, unspecified: Secondary | ICD-10-CM | POA: Insufficient documentation

## 2017-01-21 DIAGNOSIS — M4696 Unspecified inflammatory spondylopathy, lumbar region: Secondary | ICD-10-CM

## 2017-01-21 DIAGNOSIS — M5136 Other intervertebral disc degeneration, lumbar region: Secondary | ICD-10-CM

## 2017-01-21 DIAGNOSIS — M533 Sacrococcygeal disorders, not elsewhere classified: Secondary | ICD-10-CM

## 2017-01-21 MED ORDER — RISPERIDONE 0.25 MG PO TABS: 0.2500 mg | ORAL_TABLET | Freq: Every day | ORAL | 2 refills | Status: DC

## 2017-01-21 NOTE — Patient Instructions (Signed)
PLEASE FEEL FREE TO CALL OUR OFFICE WITH ANY PROBLEMS OR QUESTIONS ([phone])      

## 2017-01-21 NOTE — Progress Notes (Signed)
Subjective:    Patient ID: Lisa [name], female    DOB: 06-13-1927, 83 y.o.   MRN: [id number]  HPI   Lisa [name] is here in follow up of her chronic gait disorder and associated pain. Lisa [name] is still struggling with sleep and states that her back is "worse". The pain is in her right low back and bothers her when Lisa [name] stands in kitchen and if Lisa [name]'s walking or moving around.   Lisa [name] had ESI's last on 07/16/15 by Dr. Metta Clines although I am unclear about location  L2-L3: Central and leftward protrusion is partially calcified. Moderate disc space narrowing is worse on the left. Advanced facet arthropathy and ligamentum flavum hypertrophy. Disc material narrows the foramen on the left. Mild central canal stenosis. Left greater than right L2 and L3 nerve root impingement.  L3-L4: Central protrusion. Moderate facet and ligamentum flavum hypertrophy. Mild central canal stenosis. No definite L4 nerve root impingement.  L4-L5: 4 mm facet mediated/ postsurgical anterolisthesis. Mild annular bulging. Asymmetric loss of interspace height on the right with lateral spurring. Right greater than left L4 and L5 nerve root impingement.  L5-S1: Severe disc space narrowing. Osseous spurring extends to both sides, worse on the right. Mild facet arthropathy. Central protrusion is partially calcified. No definite S1 nerve root displacement in the canal. The bilateral L5 nerve roots right greater than left could be compressed in the extraforaminal soft tissues.   Insomnia is an issue. Lisa [name] struggles to fall asleep and then when Lisa [name] finally does, her sleep is broken. Lisa [name] will frequently awaken in the middle of the night. Lisa [name] apparently gets up and snacks when Lisa [name]'s up as well. Lisa [name] admits to putting on TV shows Lisa [name] likes to watch when Lisa [name] doesn't fall aslepp right away. The seroquel helped initially, but now Lisa [name] appears to be struggling again.   Pain Inventory Average Pain 5 Pain Right Now 3 My pain is  aching  In the last 24 hours, has pain interfered with the following? General activity 0 Relation with others 0 Enjoyment of life 0 What TIME of day is your pain at its worst? evening Sleep (in general) Fair  Pain is worse with: standing Pain improves with: medication Relief from Meds: 0  Mobility walk with assistance use a walker  Function retired  Neuro/Psych weakness tingling  Prior Studies Any changes since last visit?  no  Physicians involved in your care Any changes since last visit?  no   Family History  Problem Relation Age of Onset  . COPD Mother   . COPD Father   . Heart disease Father    Social History   Social History  . Marital status: Widowed    Spouse name: N/A  . Number of children: N/A  . Years of education: N/A   Occupational History  . retired    Social History Main Topics  . Smoking status: Never Smoker  . Smokeless tobacco: Never Used  . Alcohol use No  . Drug use: No  . Sexual activity: Not on file   Other Topics Concern  . Not on file   Social History Narrative  . No narrative on file   Past Surgical History:  Procedure Laterality Date  . BACK SURGERY N/A   . BREAST LUMPECTOMY N/A    Past Medical History:  Diagnosis Date  . Allergy   . Brain bleed (HCC)   . Chronic systolic CHF (congestive heart failure) (HCC)   . Depression   . Foot fracture  bilateral  . Hypertension   . Osteoarthritis   . Pelvis fracture (HCC)   . Renal disorder   . Spinal stenosis   . V-tach (HCC)   . Wrist fracture, right    There were no vitals taken for this visit.  Opioid Risk Score:   Fall Risk Score:  `1  Depression screen PHQ 2/9  Depression screen [facility] 2/9 11/14/2016 10/22/2015 07/24/2015 07/16/2015 06/26/2015 05/22/2015 04/23/2015  Decreased Interest 1 0 0 0 0 0 0  Down, Depressed, Hopeless 1 0 0 0 0 0 0  PHQ - 2 Score 2 0 0 0 0 0 0  Altered sleeping 2 0 - - - - -  Tired, decreased energy 1 0 - - - - -  Change in appetite 0 0  - - - - -  Feeling bad or failure about yourself  0 0 - - - - -  Trouble concentrating 0 0 - - - - -  Moving slowly or fidgety/restless 0 0 - - - - -  Suicidal thoughts 0 0 - - - - -  PHQ-9 Score 5 0 - - - - -    Review of Systems  Constitutional: Positive for unexpected weight change.  HENT: Negative.   Eyes: Negative.   Respiratory: Negative.   Cardiovascular: Negative.   Gastrointestinal: Negative.   Endocrine: Negative.   Genitourinary: Negative.   Musculoskeletal: Negative.   Skin: Negative.   Allergic/Immunologic: Negative.   Neurological: Negative.   Hematological: Bruises/bleeds easily.  Psychiatric/Behavioral: Negative.   All other systems reviewed and are negative.      Objective:   Physical Exam  HENT:  Head: Normocephalic and atraumatic.  Right Ear: No decreased hearing is noted.  Left Ear: No decreased hearing is noted.  Mouth/Throat: oral membranes moist Eyes: Conjunctivae are normal. Pupils are equal, round, and reactive to light.  Neck: Normal range of motion. Neck supple.  Cardiovascular: RRR  Respiratory: CTA B  GI: Soft. Bowel sounds are normal. Lisa [name] exhibits no distension. There is no tenderness. There is no rebound.  Musculoskeletal: Low back TTP, most prominently in Right PSIS/lower lumbar area. Extension and facet maneuver exacerbated pain in right lower back. .   Neurological: alert and oriented. Reasonable insight and awareness.  Balance functional with walker. Motor 5/5 UE's. LE's 4/5 at hips/knees partly due to pain. 5/5 ankles.   Transfers easily and changes directions without problems.  Sensation intact to light touch in bilateral upper and lower limbs.  Skin: intact without breakdown    Assessment & Plan:  Medical Problem List and Plan: 1. 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    -pt near cognitive baseline   3. Chronic back  pain/Pain Management: lumbar post lami syndrome.  Last [facility] 07/16/15 by Dr. Metta Clines.    -reviewing her CT myelogram, Lisa [name] has multiple potential pain generators in her low back. Her facets appear most symptomatic on exam however  -I would recommend right MBB at L5-S1 to start  -Lisa [name] would need to come off eliquis prior to procedure. Dr. Kirke Corin is her cardiologist 4. Mood: LCSW to follow for evaluation and support.   5. Insomnia, ?mild sundowning? -discussed proprer sleep hygiene once again  -will dc seroquel and try low dose risperdal for sleep/mood, 0.25mg  qhs #30.  6. Right shoulder pain: likely RTC tendonitis. Exercises were provided and reviewed. May apply heat. Utilize tylenol as well.   7. Depression: celexa   Follow up  with me in about a month for injection. 15 minutes of face to face patient care time were spent during this visit. All questions were encouraged and answered.

## 2017-02-23 ENCOUNTER — Ambulatory Visit: Payer: Medicare Other | Admitting: Physical Medicine & Rehabilitation

## 2017-02-24 ENCOUNTER — Ambulatory Visit (HOSPITAL_BASED_OUTPATIENT_CLINIC_OR_DEPARTMENT_OTHER): Payer: Medicare Other | Admitting: Physical Medicine & Rehabilitation

## 2017-02-24 ENCOUNTER — Encounter: Payer: Medicare Other | Attending: Physical Medicine & Rehabilitation

## 2017-02-24 DIAGNOSIS — M1990 Unspecified osteoarthritis, unspecified site: Secondary | ICD-10-CM | POA: Diagnosis not present

## 2017-02-24 DIAGNOSIS — R269 Unspecified abnormalities of gait and mobility: Secondary | ICD-10-CM | POA: Insufficient documentation

## 2017-02-24 DIAGNOSIS — I110 Hypertensive heart disease with heart failure: Secondary | ICD-10-CM | POA: Insufficient documentation

## 2017-02-24 DIAGNOSIS — I5022 Chronic systolic (congestive) heart failure: Secondary | ICD-10-CM | POA: Diagnosis not present

## 2017-02-24 DIAGNOSIS — M47816 Spondylosis without myelopathy or radiculopathy, lumbar region: Secondary | ICD-10-CM | POA: Insufficient documentation

## 2017-02-24 DIAGNOSIS — M5126 Other intervertebral disc displacement, lumbar region: Secondary | ICD-10-CM | POA: Diagnosis not present

## 2017-02-24 DIAGNOSIS — F329 Major depressive disorder, single episode, unspecified: Secondary | ICD-10-CM | POA: Insufficient documentation

## 2017-02-24 DIAGNOSIS — M48061 Spinal stenosis, lumbar region without neurogenic claudication: Secondary | ICD-10-CM | POA: Insufficient documentation

## 2017-02-24 DIAGNOSIS — G4700 Insomnia, unspecified: Secondary | ICD-10-CM | POA: Diagnosis not present

## 2017-02-24 NOTE — Progress Notes (Signed)
Right lumbar L4 medial branch blocks and L5 dorsal ramus injection under fluoroscopic guidance to block L5-S1 lumbar facet joint  Indication: Right Lumbar pain which is not relieved by medication management or other conservative care and interfering with self-care and mobility.  Informed consent was obtained after describing risks and benefits of the procedure with the patient, this includes bleeding, bruising, infection, paralysis and medication side effects. The patient wishes to proceed and has given written consent. The patient was placed in a prone position. The lumbar area was marked and prepped with Betadine. One ML of 1% lidocaine was injected into each of 3 areas into the skin and subcutaneous tissue. Then a 22-gauge 3.5 spinal needle was inserted targeting the junction of the Right S1 superior articular process and sacral ala junction. Needle was advanced under fluoroscopic guidance. Bone contact was made.Isovue 200 was injected x0.5 mL demonstrating no intravascular uptake. Then a solution containing 2% MPF lidocaine was injected x0.5 mL. Then the Right L5 superior articular process in transverse process junction was targeted. Bone contact was made.Isovue 200 was injected x0.5 mL demonstrating no intravascular uptake. Then a solution containing 2% MPF lidocaine was injected x0.5 mL. Then the Right L4 superior articular process in transverse process junction was targeted. Bone contact was made. Isovue 200 was injected x0.5 mL demonstrating no intravascular uptake. Then a solution containing2% MPF lidocaine was injected x0.5 mL Patient tolerated procedure well. Post procedure instructions were given. Please refer to post procedure form.  Preinjection pain is 3/10  Postinjection pain 0/10

## 2017-02-24 NOTE — Patient Instructions (Signed)
May resume  apixaban (Eliquis)  tomorrow

## 2017-02-24 NOTE — Progress Notes (Signed)
  PROCEDURE RECORD [facility]   Name: Lisa [name] DOB:03/16/1927 MRN: [id number]  Date:02/24/2017  Physician: Claudette LawsAndrew Kirsteins, MD    Nurse/CMA: Bright, CMA  Allergies:  Allergies  Allergen Reactions  . Ambien [Zolpidem Tartrate] Other (See Comments)    Reaction:  Hallucinations   . Daypro [Oxaprozin] Other (See Comments)    GI upset  . Oxycodone Nausea And Vomiting  . Shellfish Allergy Swelling and Other (See Comments)    Pts mouth and face swells.   . Ace Inhibitors Cough  . Norvasc [Amlodipine Besylate] Palpitations    Consent Signed: Yes.    Is patient diabetic? No.  CBG today?   Pregnant: No. LMP: No LMP recorded. Patient is postmenopausal. (age 83-55)  Anticoagulants: stopped eliquis 02/19/2017 Anti-inflammatory: no Antibiotics: no  Procedure: right L5-S1 medial branch block  Position: Prone   Start Time: 234pm  End Time:  240  Fluoro Time: 23s  RN/CMA Margretta Zamorano, CMA Bright, CMA    Time 2:10pm 242pm    BP 166/72 155/67    Pulse 76 77    Respirations 14 14    O2 Sat 96 95    S/S 6 6    Pain Level 3/10 0/10     D/C home with [name], patient A & O X 3, D/C instructions reviewed, and sits independently.

## 2017-03-05 ENCOUNTER — Encounter: Payer: Self-pay | Admitting: Cardiovascular Disease

## 2017-03-05 ENCOUNTER — Ambulatory Visit (INDEPENDENT_AMBULATORY_CARE_PROVIDER_SITE_OTHER): Payer: Medicare Other | Admitting: Cardiovascular Disease

## 2017-03-05 VITALS — BP 138/60 | HR 79 | Ht 63.0 in | Wt 189.0 lb

## 2017-03-05 DIAGNOSIS — I10 Essential (primary) hypertension: Secondary | ICD-10-CM | POA: Diagnosis not present

## 2017-03-05 DIAGNOSIS — I480 Paroxysmal atrial fibrillation: Secondary | ICD-10-CM

## 2017-03-05 DIAGNOSIS — I5022 Chronic systolic (congestive) heart failure: Secondary | ICD-10-CM | POA: Diagnosis not present

## 2017-03-05 NOTE — Progress Notes (Signed)
Cardiology Office Note   Date:  03/05/2017   ID:  Lisa SnellenFaye K Belnap, DOB 12/07/1926, MRN [id number]  PCP:  Kandyce RudBabaoff, Marcus, MD  Cardiologist:   Lorine BearsMuhammad Nastasha Reising, MD   Chief Complaint  Patient presents with  . other    4 month follow up. Meds reviewed by the pt. verbally. Pt. c/o LE edema & back pain.       History of Present Illness: Lisa [name] is a 83 y.o. female who presents for a follow-up visit regarding chronic systolic heart failure, LBBB and paroxysmal atrial fibrillation.  She presented in November 2016 with subdural and subarachnoid hemorrhage after a fall. She suffered cardiac arrest at that time due to ventricular tachycardia. Troponin was mildly elevated. Echocardiogram showed an ejection fraction of 15-20% with akinesis of the entire anteroseptal myocardium with moderate pulmonary hypertension. She was suspected of having stress-induced cardiomyopathy and was managed conservatively.  A follow-up echocardiogram in February 2017 showed an ejection fraction of 35-40% with severe anteroseptal hypokinesis. She had atrial fibrillation in the setting of acute illness and has been on amiodarone and anticoagulation. She has underlying chronic kidney disease with creatinine around 2. During last visit, I added small dose furosemide 20 mg once daily. She has been doing reasonably well and denies any chest pain or significant dyspnea. She has trace leg edema.   Past Medical History:  Diagnosis Date  . Allergy   . Brain bleed (HCC)   . Chronic systolic CHF (congestive heart failure) (HCC)   . Depression   . Foot fracture    bilateral  . Hypertension   . Osteoarthritis   . Pelvis fracture (HCC)   . Renal disorder   . Spinal stenosis   . V-tach (HCC)   . Wrist fracture, right     Past Surgical History:  Procedure Laterality Date  . BACK SURGERY N/A   . BREAST LUMPECTOMY N/A      Current Outpatient Prescriptions  Medication Sig Dispense Refill  . acetaminophen (TYLENOL)  325 MG tablet Take 1-2 tablets (325-650 mg total) by mouth every 4 (four) hours as needed for mild pain.    [name]. amiodarone (PACERONE) 100 MG tablet Take 1 tablet (100 mg total) by mouth daily. 90 tablet 3  . apixaban (ELIQUIS) 2.5 MG TABS tablet Take 1 tablet (2.5 mg total) by mouth 2 (two) times daily. 60 tablet 3  . Cholecalciferol (VITAMIN D-1000 MAX ST) 1000 units tablet Take by mouth.    . citalopram (CELEXA) 20 MG tablet   4  . furosemide (LASIX) 20 MG tablet Take 1 tablet (20 mg total) by mouth daily. 90 tablet 3  . irbesartan (AVAPRO) 150 MG tablet Take 150 mg by mouth daily.    [name]. levothyroxine (SYNTHROID, LEVOTHROID) 25 MCG tablet Take 25 mcg by mouth daily.    . metoprolol tartrate (LOPRESSOR) 25 MG tablet Take 12.5 mg by mouth 2 (two) times daily.   4  . mirtazapine (REMERON) 15 MG tablet   4  . Multiple Vitamins-Minerals (ICAPS) CAPS Take by mouth.    [name]. omeprazole (PRILOSEC) 20 MG capsule Take 1 capsule by mouth daily.  10  . raloxifene (EVISTA) 60 MG tablet Take 1 tablet by mouth daily.    . risperiDONE (RISPERDAL) 0.25 MG tablet Take 1 tablet (0.25 mg total) by mouth at bedtime. 30 tablet 2  . senna-docusate (SENOKOT-S) 8.6-50 MG tablet Take 2 tablets by mouth 2 (two) times daily.     No current facility-administered medications  for this visit.     Allergies:   Ambien [zolpidem tartrate]; Daypro [oxaprozin]; Oxycodone; Shellfish allergy; Ace inhibitors; and Norvasc [amlodipine besylate]    Social History:  The patient  reports that she has never smoked. She has never used smokeless tobacco. She reports that she does not drink alcohol or use drugs.   Family History:  [name] family history includes COPD in her father and mother; Heart disease in her father.    ROS:  Please see the history of present illness.   Otherwise, review of systems are positive for none.   All other systems are reviewed and negative.    PHYSICAL EXAM: VS:  BP 138/60 (BP Location: Left Arm,  Patient Position: Sitting, Cuff Size: Normal)   Pulse 79   Ht 5\' 3"  (1.6 m)   Wt 189 lb (85.7 kg)   BMI 33.48 kg/m  , BMI Body mass index is 33.48 kg/m. GEN: Well nourished, well developed, in no acute distress  HEENT: normal  Neck: no JVD, carotid bruits, or masses Cardiac: RRR; no murmurs, rubs, or gallops,Mild edema  Respiratory:  clear to auscultation bilaterally, normal work of breathing GI: soft, nontender, nondistended, + BS MS: no deformity or atrophy  Skin: warm and dry, no rash Neuro:  Strength and sensation are intact Psych: euthymic mood, full affect   EKG:  EKG is ordered today. The ekg ordered today demonstrates normal sinus rhythm with left bundle branch block.   Recent Labs: 06/09/2016: ALT 18; TSH 6.350 11/11/2016: BUN 24; Creatinine, Ser 2.21; Hemoglobin 9.0; Platelets 267; Potassium 4.1; Sodium 141    Lipid Panel    Component Value Date/Time   CHOL 174 12/10/2015 0537   TRIG 115 12/10/2015 0537   HDL 44 12/10/2015 0537   CHOLHDL 4.0 12/10/2015 0537   VLDL 23 12/10/2015 0537   LDLCALC 107 (H) 12/10/2015 0537      Wt Readings from Last 3 Encounters:  03/05/17 189 lb (85.7 kg)  11/14/16 180 lb (81.6 kg)  11/03/16 184 lb 4 oz (83.6 kg)        ASSESSMENT AND PLAN:  1.  Chronic systolic heart failure: Due to stress-induced cardiomyopathy versus ischemic cardiomyopathy. Continue treatment with small dose metoprolol and ARB.  She appears to be euvolemic on current dose of furosemide 20 mg once daily.  2. Paroxysmal atrial fibrillation: She is currently in normal sinus rhythm.  No evidence of recurrent atrial fibrillation. Continue small dose amiodarone and Eliquis.  3. Essential hypertension: Blood pressure is controlled.  Disposition:   FU with me in 6 months  Signed,  Lorine Bears, MD  03/05/2017 11:07 AM    [facility]

## 2017-03-05 NOTE — Patient Instructions (Signed)
Medication Instructions: Continue same medications.   Labwork: None.   Procedures/Testing: None.   Follow-Up: 6 months with Dr. Elan Mcelvain.   Any Additional Special Instructions Will Be Listed Below (If Applicable).     If you need a refill on your cardiac medications before your next appointment, please call your pharmacy.   

## 2017-03-17 ENCOUNTER — Emergency Department
Admission: EM | Admit: 2017-03-17 | Discharge: 2017-03-17 | Disposition: A | Discharge status: Home / Self Care | Payer: Medicare Other | Attending: Emergency Medicine | Admitting: Emergency Medicine

## 2017-03-17 ENCOUNTER — Encounter: Payer: Self-pay | Admitting: Emergency Medicine

## 2017-03-17 DIAGNOSIS — Z79899 Other long term (current) drug therapy: Secondary | ICD-10-CM | POA: Diagnosis not present

## 2017-03-17 DIAGNOSIS — I110 Hypertensive heart disease with heart failure: Secondary | ICD-10-CM | POA: Insufficient documentation

## 2017-03-17 DIAGNOSIS — I5022 Chronic systolic (congestive) heart failure: Secondary | ICD-10-CM | POA: Diagnosis not present

## 2017-03-17 DIAGNOSIS — G8929 Other chronic pain: Secondary | ICD-10-CM

## 2017-03-17 DIAGNOSIS — M545 Low back pain, unspecified: Secondary | ICD-10-CM

## 2017-03-17 MED ORDER — LIDOCAINE 5 % EX PTCH
1.0000 | MEDICATED_PATCH | Freq: Once | CUTANEOUS | Status: DC
  Administered 2017-03-17: 1 via TRANSDERMAL
  Filled 2017-03-17: qty 1

## 2017-03-17 MED ORDER — HYDROCODONE-ACETAMINOPHEN 5-325 MG PO TABS
1.0000 | ORAL_TABLET | Freq: Once | ORAL | Status: AC
  Administered 2017-03-17: 1 via ORAL
  Filled 2017-03-17: qty 1

## 2017-03-17 MED ORDER — LIDOCAINE 5 % EX PTCH: 1.0000 | MEDICATED_PATCH | Freq: Two times a day (BID) | CUTANEOUS | 0 refills | Status: DC

## 2017-03-17 MED ORDER — HYDROCODONE-ACETAMINOPHEN 5-325 MG PO TABS: 1.0000 | ORAL_TABLET | Freq: Four times a day (QID) | ORAL | 0 refills | Status: DC | PRN

## 2017-03-17 NOTE — Discharge Instructions (Signed)
Please follow-up with your primary care physician later on this week for recheck. Return to the emergency department sooner for any concerns.  It was a pleasure to take care of you today, and thank you for coming to our emergency department.  If you have any questions or concerns before leaving please ask the nurse to grab me and I'm more than happy to go through your aftercare instructions again.  If you were prescribed any opioid pain medication today such as Norco, Vicodin, Percocet, morphine, hydrocodone, or oxycodone please make sure you do not drive when you are taking this medication as it can alter your ability to drive safely.  If you have any concerns once you are home that you are not improving or are in fact getting worse before you can make it to your follow-up appointment, please do not hesitate to call 911 and come back for further evaluation.  Merrily BrittleNeil Declyn Delsol MD

## 2017-03-17 NOTE — ED Provider Notes (Signed)
[facility] Emergency Department Provider Note  ____________________________________________   First MD Initiated Contact with Patient 03/17/17 1840     (approximate)  I have reviewed the triage vital signs and the nursing notes.   HISTORY  Chief Complaint Back Pain   HPI Lisa [name] is a 83 y.o. female who comes to the emergency department via EMS with acute exacerbation of her chronic right low back pain. She has a long history of low back pain secondary to multiple herniated disks. She is followed by PM&R and has been determined to not be a surgical candidate.  She denies new trauma.  She denies fevers or chills.  She denies steroid use.  She denies IVDU.  She denies numbness or weakness.  The pain does radiate across her anterior R upper thigh.  She has no difficulty initiating urination or defecation.   Past Medical History:  Diagnosis Date  . Allergy   . Brain bleed (HCC)   . Chronic systolic CHF (congestive heart failure) (HCC)   . Depression   . Foot fracture    bilateral  . Hypertension   . Osteoarthritis   . Pelvis fracture (HCC)   . Renal disorder   . Spinal stenosis   . V-tach (HCC)   . Wrist fracture, right     Patient Active Problem List   Diagnosis Date Noted  . Right shoulder pain 09/23/2016  . New onset atrial fibrillation (HCC) 12/11/2015  . Altered mental state   . Congestive dilated cardiomyopathy (HCC)   . Dementia   . Headache 12/10/2015  . TIA (transient ischemic attack) 12/10/2015  . Influenza A 11/21/2015  . Ventricular tachycardia (HCC) 11/09/2015  . Constipation 09/12/2015  . Insomnia due to anxiety and fear 09/12/2015  . Depression, controlled 09/12/2015  . Bacterial UTI 08/27/2015  . DVT, lower extremity (HCC) 08/27/2015  . TBI (traumatic brain injury) (HCC) 08/22/2015  . ICH (intracerebral hemorrhage) (HCC)   . Abscess   . Hypomagnesemia   . SDH (subdural hematoma) (HCC)   . Boil of upper extremity     . Pneumonia due to Haemophilus influenzae (HCC)   . Chronic systolic CHF (congestive heart failure) (HCC)   . Acute on chronic renal failure (HCC)   . Hypokalemia   . Bright red blood per rectum   . Dysphagia   . Pressure ulcer 08/16/2015  . Acute respiratory failure with hypoxemia (HCC)   . Hypernatremia   . Subdural hematoma (HCC) 08/03/2015  . Lumbar radiculopathy 06/06/2015  . Spinal stenosis, lumbar region, with neurogenic claudication 04/02/2015  . Degenerative lumbar disc 02/15/2015  . Lumbar post-laminectomy syndrome 02/15/2015  . Facet syndrome, lumbar (HCC) 02/15/2015  . Sacroiliac joint dysfunction 02/15/2015    Past Surgical History:  Procedure Laterality Date  . BACK SURGERY N/A   . BREAST LUMPECTOMY N/A     Prior to Admission medications   Medication Sig Start Date End Date Taking? Authorizing Provider  acetaminophen (TYLENOL) 325 MG tablet Take 1-2 tablets (325-650 mg total) by mouth every 4 (four) hours as needed for mild pain. 09/12/15  Yes Love, Evlyn Kanner, PA-C  amiodarone (PACERONE) 100 MG tablet Take 1 tablet (100 mg total) by mouth daily. 11/03/16  Yes Iran Ouch, MD  apixaban (ELIQUIS) 2.5 MG TABS tablet Take 1 tablet (2.5 mg total) by mouth 2 (two) times daily. 12/24/16  Yes Iran Ouch, MD  citalopram (CELEXA) 20 MG tablet Take 20 mg by mouth daily.  02/11/16  Yes [provider]  furosemide (LASIX) 20 MG tablet Take 1 tablet (20 mg total) by mouth daily. 11/03/16 03/17/17 Yes Iran OuchArida, Muhammad A, MD  irbesartan (AVAPRO) 150 MG tablet Take 150 mg by mouth daily.   Yes [provider]  levothyroxine (SYNTHROID, LEVOTHROID) 25 MCG tablet Take 25 mcg by mouth daily. 06/16/16 06/16/17 Yes [provider]  metoprolol tartrate (LOPRESSOR) 25 MG tablet Take 12.5 mg by mouth 2 (two) times daily.  02/11/16  Yes [provider]  mirtazapine (REMERON) 15 MG tablet Take 15 mg by mouth at bedtime.  02/11/16  Yes [provider]  omeprazole (PRILOSEC) 20 MG capsule Take 1 capsule by mouth daily. 10/05/15  Yes [provider]  Polyethyl Glycol-Propyl Glycol (SYSTANE) 0.4-0.3 % SOLN Apply 1 drop to eye as needed.   Yes [provider]  raloxifene (EVISTA) 60 MG tablet Take 1 tablet by mouth daily. 10/05/15  Yes [provider]  risperiDONE (RISPERDAL) 0.25 MG tablet Take 1 tablet (0.25 mg total) by mouth at bedtime. 01/21/17  Yes Ranelle OysterSwartz, Zachary T, MD  senna-docusate (SENOKOT-S) 8.6-50 MG tablet Take 2 tablets by mouth 2 (two) times daily. 09/12/15  Yes Love, Evlyn KannerPamela S, PA-C  HYDROcodone-acetaminophen (NORCO) 5-325 MG tablet Take 1 tablet by mouth every 6 (six) hours as needed for severe pain. 03/17/17   Merrily Brittleifenbark, Jett Kulzer, MD  lidocaine (LIDODERM) 5 % Place 1 patch onto the skin every 12 (twelve) hours. Remove & Discard patch within 12 hours or as directed by MD 03/17/17 03/17/18  Merrily Brittleifenbark, Tavarion Babington, MD    Allergies Ambien [zolpidem tartrate]; Daypro [oxaprozin]; Oxycodone; Shellfish allergy; Ace inhibitors; and Norvasc [amlodipine besylate]  Family History  Problem Relation Age of Onset  . COPD Mother   . COPD Father   . Heart disease Father     Social History Social History  Substance Use Topics  . Smoking status: Never Smoker  . Smokeless tobacco: Never Used  . Alcohol use No    Review of Systems Constitutional: No fever/chills Eyes: No visual changes. ENT: No sore throat. Cardiovascular: Denies chest pain. Respiratory: Denies shortness of breath. Gastrointestinal: No abdominal pain.  No nausea, no vomiting.  No diarrhea.  No constipation. Genitourinary: Negative for dysuria. Musculoskeletal: Positive for back pain. Skin: Negative for rash. Neurological: Negative for headaches, focal weakness or numbness.   ____________________________________________   PHYSICAL EXAM:  VITAL SIGNS: ED Triage Vitals  Enc Vitals Group     BP 03/17/17 1838 (!) 159/72     Pulse Rate 03/17/17  1838 83     Resp 03/17/17 1838 18     Temp --      Temp Source 03/17/17 1838 Oral     SpO2 03/17/17 1833 95 %     Weight 03/17/17 1834 189 lb (85.7 kg)     Height 03/17/17 1834 5\' 3"  (1.6 m)     Head Circumference --      Peak Flow --      Pain Score 03/17/17 1834 8     Pain Loc --      Pain Edu? --      Excl. in GC? --     Constitutional: Alert and oriented 4 joking laughing very well-appearing cooperative speaks in full clear sentences Eyes: PERRL EOMI. Head: Atraumatic. Nose: No congestion/rhinnorhea. Mouth/Throat: No trismus Neck: No stridor.   Cardiovascular: Normal rate, regular rhythm. Grossly normal heart sounds.  Good peripheral circulation. Respiratory: Normal respiratory effort.  No retractions. Lungs CTAB and moving  good air Gastrointestinal: Soft nontender Musculoskeletal: No lower extremity edema   No midline back tenderness somewhat tender right low back paraspinal roughly L3 region. Full range of motion bilateral hips with no discomfort neurovascularly intact Neurologic:  Normal speech and language. No gross focal neurologic deficits are appreciated. Skin:  Skin is warm, dry and intact. No rash noted. Psychiatric: Mood and affect are normal. Speech and behavior are normal.    ____________________________________________  ____________________________________________   LABS (all labs ordered are listed, but only abnormal results are displayed)  Labs Reviewed - No data to display   __________________________________________  EKG   ____________________________________________  RADIOLOGY   ____________________________________________   PROCEDURES  Procedure(s) performed: no  Procedures  Critical Care performed: no  Observation: no ____________________________________________   INITIAL IMPRESSION / ASSESSMENT AND PLAN / ED COURSE  Pertinent labs & imaging results that were available during my care of the patient were reviewed by me and  considered in my medical decision making (see chart for details).  The patient arrives very well-appearing hemodynamically stable with a normal neurological exam. She says this pain is identical to previous pain episodes that she is currently out of all opioid pain medications. We have placed a Lidoderm patch on her back which is nearly completely resolved her pain. Think it is reasonable to give her a short course of hydrocodone which is worked for her in the past. She has follow-up established with her physical medicine and rehabilitation Dr. Hermelinda Medicus as well as her primary care physician. Strict return precautions given.      ____________________________________________   FINAL CLINICAL IMPRESSION(S) / ED DIAGNOSES  Final diagnoses:  Chronic right-sided low back pain without sciatica      NEW MEDICATIONS STARTED DURING THIS VISIT:  Discharge Medication List as of 03/17/2017  8:54 PM    START taking these medications   Details  HYDROcodone-acetaminophen (NORCO) 5-325 MG tablet Take 1 tablet by mouth every 6 (six) hours as needed for severe pain., Starting Tue 03/17/2017, Print    lidocaine (LIDODERM) 5 % Place 1 patch onto the skin every 12 (twelve) hours. Remove & Discard patch within 12 hours or as directed by MD, Starting Tue 03/17/2017, Until Wed 03/17/2018, Print         Note:  This document was prepared using Dragon voice recognition software and may include unintentional dictation errors.     Merrily Brittle, MD 03/18/17 [phone]

## 2017-03-17 NOTE — ED Triage Notes (Addendum)
Pt presents to ED via [facility]. Per EMS, EMS was originally called out for breathing difficulty, however upon arrival, pt complained of R lower back pain. Pt states had shots approx 2 weeks ago for arthritis, states was taking Tylenol at home for pain without relief. Pt states pain radiates down the top of her leg today. Pt is alert and oriented at this time. EMS also reports hx of bulging disc and "colliding discs".

## 2017-03-23 ENCOUNTER — Other Ambulatory Visit: Payer: Self-pay | Admitting: Physical Medicine & Rehabilitation

## 2017-03-23 DIAGNOSIS — F5105 Insomnia due to other mental disorder: Principal | ICD-10-CM

## 2017-03-23 DIAGNOSIS — F409 Phobic anxiety disorder, unspecified: Secondary | ICD-10-CM

## 2017-04-07 ENCOUNTER — Encounter: Payer: Medicare Other | Attending: Physical Medicine & Rehabilitation | Admitting: Physical Medicine & Rehabilitation

## 2017-04-07 ENCOUNTER — Telehealth: Payer: Self-pay | Admitting: *Deleted

## 2017-04-07 ENCOUNTER — Encounter: Payer: Self-pay | Admitting: Physical Medicine & Rehabilitation

## 2017-04-07 VITALS — BP 173/76 | HR 60

## 2017-04-07 DIAGNOSIS — G4700 Insomnia, unspecified: Secondary | ICD-10-CM | POA: Diagnosis not present

## 2017-04-07 DIAGNOSIS — F329 Major depressive disorder, single episode, unspecified: Secondary | ICD-10-CM | POA: Insufficient documentation

## 2017-04-07 DIAGNOSIS — M5126 Other intervertebral disc displacement, lumbar region: Secondary | ICD-10-CM | POA: Diagnosis not present

## 2017-04-07 DIAGNOSIS — M4696 Unspecified inflammatory spondylopathy, lumbar region: Secondary | ICD-10-CM | POA: Diagnosis not present

## 2017-04-07 DIAGNOSIS — M5416 Radiculopathy, lumbar region: Secondary | ICD-10-CM | POA: Diagnosis not present

## 2017-04-07 DIAGNOSIS — M1990 Unspecified osteoarthritis, unspecified site: Secondary | ICD-10-CM | POA: Insufficient documentation

## 2017-04-07 DIAGNOSIS — M47816 Spondylosis without myelopathy or radiculopathy, lumbar region: Secondary | ICD-10-CM | POA: Diagnosis not present

## 2017-04-07 DIAGNOSIS — I5022 Chronic systolic (congestive) heart failure: Secondary | ICD-10-CM | POA: Diagnosis not present

## 2017-04-07 DIAGNOSIS — I110 Hypertensive heart disease with heart failure: Secondary | ICD-10-CM | POA: Diagnosis not present

## 2017-04-07 DIAGNOSIS — M48061 Spinal stenosis, lumbar region without neurogenic claudication: Secondary | ICD-10-CM | POA: Diagnosis not present

## 2017-04-07 DIAGNOSIS — R269 Unspecified abnormalities of gait and mobility: Secondary | ICD-10-CM | POA: Diagnosis present

## 2017-04-07 NOTE — Patient Instructions (Signed)
YOU MAY PROCEED WITH THERAPY IF YOU CAN TOLERATE IT.    PLEASE FEEL FREE TO CALL OUR OFFICE WITH ANY PROBLEMS OR QUESTIONS [phone])

## 2017-04-07 NOTE — Telephone Encounter (Signed)
Peggy from [facility] left a message this morning hoping to speak with Dr. Riley KillSwartz or his nurse about patients observed traits while on muscle relaxant and pain medications.  She als added that the patient's son would like to speak to Dr. Riley KillSwartz as well.  I contacted the son first and asked about his concerns.  He echoed the sentiment of diminished balance and sentiment.  I assured the son that Dr. Rosalyn ChartersSwartz's notes indicate he is not continuing with the medication that was prescribed by the ED.  I contacted Peggy at [facility] and repeated the sentiment.  I informed that pt's pain mgmt is non-narcotic and will consist of Heat/Ice, message, therapy and injections or radiofrequency ablation.

## 2017-04-07 NOTE — Progress Notes (Signed)
Subjective:    Patient ID: Lisa [name], female    DOB: 11/12/1926, 83 y.o.   MRN: [id number]  HPI   Lisa [name] is here in follow up of her chronic low back pain. She had nearly 100% relief of her pain for about two weeks after the MBB in June. Since then her pain has returned. She never called this office but instead went to the ED who prescrbied her hydrocodone and a muscle relaxant. She was unable to tolerate either. Her pain is in the familiar area of her back, the right low back near the belt. It is worse with standing and acivity. She finds it difficult to sleep. She is supposed to be starting therapy tomorrow per direction of ED/PCP  Pain Inventory Average Pain 2 Pain Right Now 2 My pain is sharp and aching  In the last 24 hours, has pain interfered with the following? General activity 5 Relation with others 5 Enjoyment of life 5 What TIME of day is your pain at its worst? daytime Sleep (in general) Fair  Pain is worse with: standing Pain improves with: rest and injections Relief from Meds: .  Mobility use a walker ability to climb steps?  yes do you drive?  no needs help with transfers  Function retired I need assistance with the following:  household duties and shopping  Neuro/Psych No problems in this area  Prior Studies Any changes since last visit?  no  Physicians involved in your care Any changes since last visit?  no   Family History  Problem Relation Age of Onset  . COPD Mother   . COPD Father   . Heart disease Father    Social History   Social History  . Marital status: Widowed    Spouse name: N/A  . Number of children: N/A  . Years of education: N/A   Occupational History  . retired    Social History Main Topics  . Smoking status: Never Smoker  . Smokeless tobacco: Never Used  . Alcohol use No  . Drug use: No  . Sexual activity: Not on file   Other Topics Concern  . Not on file   Social History Narrative  . No narrative on file     Past Surgical History:  Procedure Laterality Date  . BACK SURGERY N/A   . BREAST LUMPECTOMY N/A    Past Medical History:  Diagnosis Date  . Allergy   . Brain bleed (HCC)   . Chronic systolic CHF (congestive heart failure) (HCC)   . Depression   . Foot fracture    bilateral  . Hypertension   . Osteoarthritis   . Pelvis fracture (HCC)   . Renal disorder   . Spinal stenosis   . V-tach (HCC)   . Wrist fracture, right    There were no vitals taken for this visit.  Opioid Risk Score:   Fall Risk Score:  `1  Depression screen PHQ 2/9  Depression screen [facility] 11/14/2016 10/22/2015 07/24/2015 07/16/2015 06/26/2015 05/22/2015 04/23/2015  Decreased Interest 1 0 0 0 0 0 0  Down, Depressed, Hopeless 1 0 0 0 0 0 0  PHQ - 2 Score 2 0 0 0 0 0 0  Altered sleeping 2 0 - - - - -  Tired, decreased energy 1 0 - - - - -  Change in appetite 0 0 - - - - -  Feeling bad or failure about yourself  0 0 - - - - -  Trouble concentrating 0 0 - - - - -  Moving slowly or fidgety/restless 0 0 - - - - -  Suicidal thoughts 0 0 - - - - -  PHQ-9 Score 5 0 - - - - -     Review of Systems  Constitutional: Positive for unexpected weight change.  HENT: Negative.   Eyes: Negative.   Respiratory: Negative.   Cardiovascular: Negative.   Gastrointestinal: Negative.   Endocrine: Negative.   Genitourinary: Negative.   Musculoskeletal: Positive for joint swelling.  Skin: Positive for rash.  Allergic/Immunologic: Negative.   Neurological: Negative.   Hematological: Bruises/bleeds easily.  Psychiatric/Behavioral: Negative.   All other systems reviewed and are negative.      Objective:   Physical Exam  HENT:  Head: Normocephalic and atraumatic.  Right Ear: No decreased hearing is noted.  Left Ear: No decreased hearing is noted.  Mouth/Throat: oral membranes moist Eyes: Conjunctivae are normal. Pupils are equal, round, and reactive to light.  Neck: Normal range of motion. Neck supple.   Cardiovascular: RRR Respiratory: CTA Bilaterally without wheezes or rales. Normal effort   GI: Soft. Bowel sounds are normal. She exhibits no distension. There is no tenderness. There is no rebound.  Musculoskeletal: Low back TTP, most prominently in Right PSIS/lower lumbar area---worst with extension and facet maneuvers. slow to stand today Neurological: alert and oriented. Reasonable insight and awareness.  Balance functional with walker. Motor 5/5 UE's. LE's 4/5 at hips/knees partly due to pain. 5/5 ankles.   Sensation intact to light touch in bilateral upper and lower limbs.  Skin: intact without breakdown  Psych: pleasant and appropriate.    Assessment & Plan:  Medical Problem List and Plan: 1. 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               -pt near cognitive baseline   3. Chronic back pain/Pain Management: lumbar post lami syndrome.  Last [facility] 07/16/15 by Dr. Metta Clines.              - she has multiple potential pain generators in her low back based on her CT myelogram. Her facets appear most symptomatic on exam however             -I would recommend RF's at right L5-S1 given the two weeks of 100% relief she had with the MBB             -she would need to come off eliquis prior to procedure. Dr. Kirke Corin is her cardiologist   -she can pursue therapy for now as tolerated  -recommend ice/heat/massage for pain relief  -avoid narcs/neurosedating meds given her age/poor tolerance 4. Mood: LCSW to follow for evaluation and support.   5. Insomnia, ?mild sundowning? -reviewed sleep hygiene once again  - low dose risperdal for sleep/mood, 0.25mg  qhs #30.  6. Right shoulder pain: likely RTC tendonitis. Maintain HEP 7. Depression: celexa   Follow up with Dr. Wynn Banker  in about a month for RF. of face to face patient care time were spent during this visit.  All questions were encouraged and answered.

## 2017-04-13 ENCOUNTER — Other Ambulatory Visit: Payer: Self-pay | Admitting: Cardiovascular Disease

## 2017-04-14 ENCOUNTER — Telehealth: Payer: Self-pay | Admitting: Physical Medicine & Rehabilitation

## 2017-04-14 NOTE — Telephone Encounter (Signed)
Patient wanted to let Dr. Riley KillSwartz know that the pain has moved to her left side and down her leg.

## 2017-04-15 ENCOUNTER — Other Ambulatory Visit: Payer: Self-pay | Admitting: Physical Medicine & Rehabilitation

## 2017-04-15 DIAGNOSIS — F5105 Insomnia due to other mental disorder: Principal | ICD-10-CM

## 2017-04-15 DIAGNOSIS — F409 Phobic anxiety disorder, unspecified: Secondary | ICD-10-CM

## 2017-04-16 NOTE — Telephone Encounter (Signed)
Would work on stretching/ROM exercises as tolerated. Would still proceed with injections as ordered. thanks

## 2017-04-17 MED ORDER — METHYLPREDNISOLONE 4 MG PO TBPK: ORAL_TABLET | ORAL | 0 refills | Status: DC

## 2017-04-17 NOTE — Telephone Encounter (Signed)
Patients care giver called this morning, states pain on back and leg has increased sharply, states so bad that the area cannot be touched due to pain, wanting to know if anything can be done sooner as the injections are scheduled for 04-28-17, please advise

## 2017-04-17 NOTE — Telephone Encounter (Signed)
Please call in a medrol dose pack.  See if there is anything sooner as far as the injection is concerned.

## 2017-04-17 NOTE — Telephone Encounter (Signed)
Med order sent to pharmacy electronically. April notified Lisa [name] and gave appt.

## 2017-04-20 ENCOUNTER — Ambulatory Visit (HOSPITAL_BASED_OUTPATIENT_CLINIC_OR_DEPARTMENT_OTHER): Payer: Medicare Other | Admitting: Physical Medicine & Rehabilitation

## 2017-04-20 ENCOUNTER — Encounter: Payer: Self-pay | Admitting: Physical Medicine & Rehabilitation

## 2017-04-20 VITALS — BP 186/79 | HR 64 | Resp 14

## 2017-04-20 DIAGNOSIS — M47816 Spondylosis without myelopathy or radiculopathy, lumbar region: Secondary | ICD-10-CM | POA: Diagnosis not present

## 2017-04-20 NOTE — Progress Notes (Signed)
  PROCEDURE RECORD [facility]   Name: Lisa [name] DOB:09/23/1926 MRN: [id number]  Date:04/20/2017  Physician: Claudette LawsAndrew Kirsteins, MD    Nurse/CMA: Cristine Daw, CMA  Allergies:  Allergies  Allergen Reactions  . Ambien [Zolpidem Tartrate] Other (See Comments)    Reaction:  Hallucinations   . Daypro [Oxaprozin] Other (See Comments)    GI upset  . Oxycodone Nausea And Vomiting  . Shellfish Allergy Swelling and Other (See Comments)    Pts mouth and face swells.   . Ace Inhibitors Cough  . Norvasc [Amlodipine Besylate] Palpitations    Consent Signed: Yes.    Is patient diabetic? No.  CBG today?   Pregnant: No. LMP: No LMP recorded. Patient is postmenopausal. (age 83-55)  Anticoagulants: no Anti-inflammatory: no Antibiotics: no  Procedure: medial branch block  Position: Prone Start Time: 1:36pm  End Time: 1:42pm  Fluoro Time: 22  RN/CMA Allysa Governale, CMA Kyrel Leighton, CMA    Time 1:20 pm 1:50pm    BP 186/79 164/85    Pulse 64 66    Respirations 14 14    O2 Sat 95 98    S/S 6 6    Pain Level 5/10 4/10     D/C home with Jamesetta SoPhyllis, patient A & O X 3, D/C instructions reviewed, and sits independently.

## 2017-04-20 NOTE — Patient Instructions (Signed)

## 2017-04-20 NOTE — Progress Notes (Signed)
Left Lumbar , L4  medial branch blocks and L 5 dorsal ramus injection under fluoroscopic guidance   Indication: Left Lumbar pain which is not relieved by medication management or other conservative care and interfering with self-care and mobility.  Informed consent was obtained after describing risks and benefits of the procedure with the patient, this includes bleeding, bruising, infection, paralysis and medication side effects.  The patient wishes to proceed and has given written consent.  The patient was placed in a prone position.  The lumbar area was marked and prepped with Betadine.  One mL of 1% lidocaine was injected into each of 3 areas into the skin and subcutaneous tissue.  Then a 22-gauge 3.5 spinal needle was inserted targeting the junction of the left S1 superior articular process and sacral ala junction.  Needle was advanced under fluoroscopic guidance.  Bone contact was made. Isovue 200 was injected x 0.5 mL demonstrating no intravascular uptake.  Then a solution containing  2% MPF lidocaine was injected x 0.5 mL.  Then the left L5 superior articular process in transverse process junction was targeted.  Bone contact was made. Isovue 200 was injected x 0.5 mL demonstrating no intravascular uptake.  Then a solution containing 2% MPF lidocaine was injected x 0.5 mL. .  Patient tolerated procedure well.  Post procedure instructions were given.

## 2017-04-27 ENCOUNTER — Telehealth: Payer: Self-pay | Admitting: Physical Medicine & Rehabilitation

## 2017-04-27 NOTE — Telephone Encounter (Signed)
Pt's son phoned and stated pt is in a lot of pain. The earliest the appointment could be moved up was 08/21. Pt would like to know what she could do for pain in the mean time. She does not want to go to the ED. Please advise.

## 2017-04-27 NOTE — Telephone Encounter (Signed)
May try acetaminophen 650 mg 4 times per day. If this does not work, may call in tramadol 50 mg by mouth twice a day #60, no refills

## 2017-04-28 ENCOUNTER — Ambulatory Visit: Payer: Medicare Other | Admitting: Physical Medicine & Rehabilitation

## 2017-04-28 MED ORDER — TRAMADOL HCL 50 MG PO TABS: 50.0000 mg | ORAL_TABLET | Freq: Two times a day (BID) | ORAL | 0 refills | Status: DC

## 2017-04-28 MED ORDER — ACETAMINOPHEN 325 MG PO TABS: 650.0000 mg | ORAL_TABLET | Freq: Four times a day (QID) | ORAL | 0 refills | Status: DC | PRN

## 2017-04-28 NOTE — Telephone Encounter (Signed)
I spoke with Lisa [name].  She is taking tylenol and it is doing "absolutely nothing".  I informed her we will call in Tramadol for her to take one table twice daily as needed for pain. She acknowledged understanding.

## 2017-05-01 ENCOUNTER — Telehealth: Payer: Self-pay | Admitting: Physical Medicine & Rehabilitation

## 2017-05-01 MED ORDER — GABAPENTIN 100 MG PO CAPS: 100.0000 mg | ORAL_CAPSULE | Freq: Three times a day (TID) | ORAL | 0 refills | Status: DC

## 2017-05-01 NOTE — Telephone Encounter (Signed)
I sent in the gabapentin order and spoke with the caregiver.  They say Lucendia Herrlich(Cheynne and the caregiver) that she got no relief whatsoever from injection.  She has another one scheduled for 05/12/17 and was asking about moving the date up if possible, but if no relief what do you advise?

## 2017-05-01 NOTE — Telephone Encounter (Signed)
Likely having some postprocedural pain, call in gabapentin 100 mg 3 times a day, hold for sedation, #90, no refills

## 2017-05-01 NOTE — Telephone Encounter (Signed)
Caregiver [name] called office from Lisa [name]'s home and states procedure last week nor meds called in have alleviated pain - she is worried that patients blood pressure is going to be affected.  I asked if she had taken it and she states no - not sure if patient has cuff.  I told her will forward info to Nurse and MD

## 2017-05-01 NOTE — Telephone Encounter (Signed)
She'll need to see Dr. Riley KillSwartz for reevaluation

## 2017-05-04 ENCOUNTER — Encounter: Payer: Self-pay | Admitting: Emergency Medicine

## 2017-05-04 ENCOUNTER — Emergency Department: Payer: Medicare Other

## 2017-05-04 ENCOUNTER — Emergency Department
Admission: EM | Admit: 2017-05-04 | Discharge: 2017-05-04 | Disposition: A | Discharge status: Home / Self Care | Payer: Medicare Other | Attending: Emergency Medicine | Admitting: Emergency Medicine

## 2017-05-04 DIAGNOSIS — I110 Hypertensive heart disease with heart failure: Secondary | ICD-10-CM | POA: Insufficient documentation

## 2017-05-04 DIAGNOSIS — I420 Dilated cardiomyopathy: Secondary | ICD-10-CM | POA: Diagnosis not present

## 2017-05-04 DIAGNOSIS — M549 Dorsalgia, unspecified: Secondary | ICD-10-CM | POA: Diagnosis present

## 2017-05-04 DIAGNOSIS — M5432 Sciatica, left side: Secondary | ICD-10-CM

## 2017-05-04 DIAGNOSIS — Z8673 Personal history of transient ischemic attack (TIA), and cerebral infarction without residual deficits: Secondary | ICD-10-CM | POA: Diagnosis not present

## 2017-05-04 DIAGNOSIS — I4891 Unspecified atrial fibrillation: Secondary | ICD-10-CM | POA: Diagnosis not present

## 2017-05-04 DIAGNOSIS — Z86718 Personal history of other venous thrombosis and embolism: Secondary | ICD-10-CM | POA: Insufficient documentation

## 2017-05-04 DIAGNOSIS — I5022 Chronic systolic (congestive) heart failure: Secondary | ICD-10-CM | POA: Diagnosis not present

## 2017-05-04 DIAGNOSIS — Z79899 Other long term (current) drug therapy: Secondary | ICD-10-CM | POA: Diagnosis not present

## 2017-05-04 HISTORY — DX: Dorsalgia, unspecified: M54.9

## 2017-05-04 HISTORY — DX: Other chronic pain: G89.29

## 2017-05-04 MED ORDER — DEXAMETHASONE SODIUM PHOSPHATE 10 MG/ML IJ SOLN
10.0000 mg | Freq: Once | INTRAMUSCULAR | Status: AC
  Administered 2017-05-04: 10 mg via INTRAMUSCULAR
  Filled 2017-05-04: qty 1

## 2017-05-04 NOTE — Discharge Instructions (Signed)
Continue previous medications

## 2017-05-04 NOTE — ED Provider Notes (Signed)
[facility] Emergency Department Provider Note   ____________________________________________   First MD Initiated Contact with Patient 05/04/17 1131     (approximate)  I have reviewed the triage vital signs and the nursing notes.   HISTORY  Chief Complaint Back Pain    HPI Lisa [name] is a 83 y.o. female patient has history of chronic back pain followed by pain management clinic. Patient managed by in-house care and they noticed progressive weakness to the lower extremities in the last 3 days. Care manager state patient unable to ambulate secondary to leg weakness.  Past Medical History:  Diagnosis Date  . Allergy   . Brain bleed (HCC)   . Chronic back pain   . Chronic systolic CHF (congestive heart failure) (HCC)   . Depression   . Foot fracture    bilateral  . Hypertension   . Osteoarthritis   . Pelvis fracture (HCC)   . Renal disorder   . Spinal stenosis   . V-tach (HCC)   . Wrist fracture, right     Patient Active Problem List   Diagnosis Date Noted  . Right shoulder pain 09/23/2016  . New onset atrial fibrillation (HCC) 12/11/2015  . Altered mental state   . Congestive dilated cardiomyopathy (HCC)   . Dementia   . Headache 12/10/2015  . TIA (transient ischemic attack) 12/10/2015  . Influenza A 11/21/2015  . Ventricular tachycardia (HCC) 11/09/2015  . Constipation 09/12/2015  . Insomnia due to anxiety and fear 09/12/2015  . Depression, controlled 09/12/2015  . Bacterial UTI 08/27/2015  . DVT, lower extremity (HCC) 08/27/2015  . TBI (traumatic brain injury) (HCC) 08/22/2015  . ICH (intracerebral hemorrhage) (HCC)   . Abscess   . Hypomagnesemia   . SDH (subdural hematoma) (HCC)   . Boil of upper extremity   . Pneumonia due to Haemophilus influenzae (HCC)   . Chronic systolic CHF (congestive heart failure) (HCC)   . Acute on chronic renal failure (HCC)   . Hypokalemia   . Bright red blood per rectum   . Dysphagia   .  Pressure ulcer 08/16/2015  . Acute respiratory failure with hypoxemia (HCC)   . Hypernatremia   . Subdural hematoma (HCC) 08/03/2015  . Lumbar radiculopathy 06/06/2015  . Spinal stenosis, lumbar region, with neurogenic claudication 04/02/2015  . Degenerative lumbar disc 02/15/2015  . Lumbar post-laminectomy syndrome 02/15/2015  . Facet syndrome, lumbar (HCC) 02/15/2015  . Sacroiliac joint dysfunction 02/15/2015    Past Surgical History:  Procedure Laterality Date  . BACK SURGERY N/A   . BREAST LUMPECTOMY N/A     Prior to Admission medications   Medication Sig Start Date End Date Taking? Authorizing Provider  acetaminophen (TYLENOL) 325 MG tablet Take 2 tablets (650 mg total) by mouth every 6 (six) hours as needed for mild pain. 04/28/17   Kirsteins, Victorino Sparrow, MD  amiodarone (PACERONE) 100 MG tablet Take 1 tablet (100 mg total) by mouth daily. 11/03/16   Iran Ouch, MD  citalopram (CELEXA) 20 MG tablet Take 20 mg by mouth daily.  02/11/16   [provider]  ELIQUIS 2.5 MG TABS tablet TAKE ONE TABLET BY MOUTH TWICE DAILY 04/13/17   Iran Ouch, MD  furosemide (LASIX) 20 MG tablet Take 1 tablet (20 mg total) by mouth daily. 11/03/16 03/17/17  Iran Ouch, MD  gabapentin (NEURONTIN) 100 MG capsule Take 1 capsule (100 mg total) by mouth 3 (three) times daily. Hold for excessive sleepiness (sedation) 05/01/17  Kirsteins, Victorino Sparrow, MD  irbesartan (AVAPRO) 150 MG tablet Take 150 mg by mouth daily.    [provider]  levothyroxine (SYNTHROID, LEVOTHROID) 25 MCG tablet Take 25 mcg by mouth daily. 06/16/16 06/16/17  [provider]  lidocaine (LIDODERM) 5 % Place 1 patch onto the skin every 12 (twelve) hours. Remove & Discard patch within 12 hours or as directed by MD 03/17/17 03/17/18  Merrily Brittle, MD  methylPREDNISolone (MEDROL DOSEPAK) 4 MG TBPK tablet Take as directed 04/17/17   Ranelle Oyster, MD  metoprolol tartrate (LOPRESSOR) 25 MG tablet Take 12.5  mg by mouth 2 (two) times daily.  02/11/16   [provider]  mirtazapine (REMERON) 15 MG tablet Take 15 mg by mouth at bedtime.  02/11/16   [provider]  omeprazole (PRILOSEC) 20 MG capsule Take 1 capsule by mouth daily. 10/05/15   [provider]  Polyethyl Glycol-Propyl Glycol (SYSTANE) 0.4-0.3 % SOLN Apply 1 drop to eye as needed.    [provider]  raloxifene (EVISTA) 60 MG tablet Take 1 tablet by mouth daily. 10/05/15   [provider]  risperiDONE (RISPERDAL) 0.25 MG tablet TAKE ONE TABLET EVERY DAY AT BEDTIME 04/15/17   Ranelle Oyster, MD  senna-docusate (SENOKOT-S) 8.6-50 MG tablet Take 2 tablets by mouth 2 (two) times daily. 09/12/15   Love, Evlyn Kanner, PA-C  traMADol (ULTRAM) 50 MG tablet Take 1 tablet (50 mg total) by mouth 2 (two) times daily. 04/28/17   Kirsteins, Victorino Sparrow, MD    Allergies Ambien [zolpidem tartrate]; Daypro [oxaprozin]; Oxycodone; Shellfish allergy; Ace inhibitors; and Norvasc [amlodipine besylate]  Family History  Problem Relation Age of Onset  . COPD Mother   . COPD Father   . Heart disease Father     Social History Social History  Substance Use Topics  . Smoking status: Never Smoker  . Smokeless tobacco: Never Used  . Alcohol use No    Review of Systems  Constitutional: No fever/chills Eyes: No visual changes. ENT: No sore throat. Cardiovascular: Denies chest pain. Respiratory: Denies shortness of breath. Gastrointestinal: No abdominal pain.  No nausea, no vomiting.  No diarrhea.  No constipation. Genitourinary: Negative for dysuria. Musculoskeletal: Negative for back pain. Skin: Negative for rash. Neurological: Negative for headaches, focal weakness or numbness. Psychiatric:Depression Endocrine:Hypertension Allergic/Immunilogical: See medication list  ____________________________________________   PHYSICAL EXAM:  VITAL SIGNS: ED Triage Vitals [05/04/17 1054]  Enc Vitals Group     BP (!)  129/59     Pulse Rate 82     Resp 20     Temp 98.5 F (36.9 C)     Temp Source Oral     SpO2 96 %     Weight 170 lb (77.1 kg)     Height 5\' 3"  (1.6 m)     Head Circumference      Peak Flow      Pain Score 5     Pain Loc      Pain Edu?      Excl. in GC?     Constitutional: Alert and oriented. Well appearing and in no acute distress. Eyes: Conjunctivae are normal. PERRL. EOMI. Head: Atraumatic. Nose: No congestion/rhinnorhea. Mouth/Throat: Mucous membranes are moist.  Oropharynx non-erythematous. Neck: No stridor. No cervical spine tenderness to palpation. Respiratory: Normal respiratory effort.  No retractions. Lungs CTAB. Gastrointestinal: Soft and nontender. No distention. No abdominal bruits. No CVA tenderness. Musculoskeletal: No lower extremity tenderness nor edema.  No joint effusions. Neurologic:  Normal  speech and language. No gross focal neurologic deficits are appreciated. No gait instability. Skin:  Skin is warm, dry and intact. No rash noted. Psychiatric: Mood and affect are normal. Speech and behavior are normal.  ____________________________________________   LABS (all labs ordered are listed, but only abnormal results are displayed)  Labs Reviewed - No data to display ____________________________________________  EKG   ____________________________________________  RADIOLOGY  Ct Lumbar Spine Wo Contrast  Result Date: 05/04/2017 CLINICAL DATA:  Chronic rapidly progressive low back pain radiating into both legs. EXAM: CT LUMBAR SPINE WITHOUT CONTRAST TECHNIQUE: Multidetector CT imaging of the lumbar spine was performed without intravenous contrast administration. Multiplanar CT image reconstructions were also generated. COMPARISON:  CT myelogram dated 02/08/2014 and lumbar radiographs dated 01/26/2014 FINDINGS: Segmentation: 5 lumbar type vertebrae. Alignment: Chronic grade 1 spondylolisthesis at L4-5. Vertebrae: Acute benign-appearing compression fracture of  L1, 40%. Minimal protrusion of the posterior inferior aspect of L1 into the spinal canal without visible neural impingement. Small paraspinal hematoma. Paraspinal and other soft tissues: Small paraspinal hematoma around the L1 vertebral body fracture. Aortic atherosclerosis. Disc levels: T11-12: Negative. T12-L1: Normal appearing disc. Comminuted fracture of L1, benign-appearing. L1-2: Diffuse bulge of the disc. Slight protrusion of the posterior inferior aspect of L1 into the spinal canal secondary to the compression fracture. No focal neural impingement. Small paraspinal hematoma. L2-3: Chronic disc space narrowing with degenerative changes of the vertebral endplates. Previously demonstrated broad-based soft disc protrusion slightly compressing the thecal sac. L3-4: Mild broad-based soft disc protrusion with hypertrophy of the ligamentum flavum combine to create moderate spinal stenosis. This appears increased in severity since the prior CT myelogram. L4-5: 5 mm spondylolisthesis with a broad-based bulge of the uncovered disc without focal neural impingement. Severe bilateral facet arthritis. The findings are unchanged. L5-S1: Chronic marked disc space narrowing. Small broad-based disc bulge without neural impingement. No foraminal stenosis. IMPRESSION: 1. Acute benign-appearing compression fracture of L1 with slight protrusion of bone into the spinal canal without neural impingement. 2. Progressive moderate spinal stenosis at L3-4. 3. Stable spondylolisthesis and degenerative disc disease at L4-5. 4. Stable degenerative disc disease at L2-3 and L5-S1. Electronically Signed   By: Francene BoyersJames  Maxwell M.D.   On: 05/04/2017 12:24    CT L-spine showed benign-appearing compression fracture L1 with naproxen 40% minimal protrusion of the posterior inferior aspect of L1 into the spinal canal without impingement. Patient also had progression of her disc bulge L1-L4. stable degenerative disc disease from L3-S1.   ____________________________________________   PROCEDURES  Procedure(s) performed: None  Procedures  Critical Care performed: No  ____________________________________________   INITIAL IMPRESSION / ASSESSMENT AND PLAN / ED COURSE  Pertinent labs & imaging results that were available during my care of the patient were reviewed by me and considered in my medical decision making (see chart for details).  Radicular back pain to the left lower extremity. Discuss CT findings with patient. Patient advised to continue previous medication follow-up with scheduled pain management appointment this week. Patient given Decadron prior to departure.       ____________________________________________   FINAL CLINICAL IMPRESSION(S) / ED DIAGNOSES  Final diagnoses:  Sciatica of left side      NEW MEDICATIONS STARTED DURING THIS VISIT:  New Prescriptions   No medications on file     Note:  This document was prepared using Dragon voice recognition software and may include unintentional dictation errors.    Joni ReiningSmith, Ronald K, PA-C 05/04/17 1328    Don PerkingVeronese, [place], MD 05/05/17 [id number]

## 2017-05-04 NOTE — ED Triage Notes (Signed)
Brought in via ems from home  Having lower back pain which is radiating into both legs  States she has a hx of chronic back pain and is currently going to pain clinic for same  But states sx's became worse since friday

## 2017-05-04 NOTE — ED Notes (Signed)
Pt assisted to standing position walker present to assist with ambulation. Pt ambulated with no distress with the use of a walker. Pt complaining of lower back pain with various positions. 2/10 pain

## 2017-05-05 ENCOUNTER — Encounter: Payer: Medicare Other | Attending: Physical Medicine & Rehabilitation | Admitting: Physical Medicine & Rehabilitation

## 2017-05-05 ENCOUNTER — Telehealth: Payer: Self-pay | Admitting: *Deleted

## 2017-05-05 ENCOUNTER — Encounter: Payer: Self-pay | Admitting: Physical Medicine & Rehabilitation

## 2017-05-05 VITALS — BP 155/75 | HR 71 | Resp 14

## 2017-05-05 DIAGNOSIS — R269 Unspecified abnormalities of gait and mobility: Secondary | ICD-10-CM | POA: Diagnosis present

## 2017-05-05 DIAGNOSIS — I5022 Chronic systolic (congestive) heart failure: Secondary | ICD-10-CM | POA: Diagnosis not present

## 2017-05-05 DIAGNOSIS — M1990 Unspecified osteoarthritis, unspecified site: Secondary | ICD-10-CM | POA: Insufficient documentation

## 2017-05-05 DIAGNOSIS — M47816 Spondylosis without myelopathy or radiculopathy, lumbar region: Secondary | ICD-10-CM | POA: Diagnosis not present

## 2017-05-05 DIAGNOSIS — M5126 Other intervertebral disc displacement, lumbar region: Secondary | ICD-10-CM | POA: Diagnosis not present

## 2017-05-05 DIAGNOSIS — M533 Sacrococcygeal disorders, not elsewhere classified: Secondary | ICD-10-CM | POA: Diagnosis not present

## 2017-05-05 DIAGNOSIS — M48061 Spinal stenosis, lumbar region without neurogenic claudication: Secondary | ICD-10-CM | POA: Insufficient documentation

## 2017-05-05 DIAGNOSIS — F329 Major depressive disorder, single episode, unspecified: Secondary | ICD-10-CM | POA: Diagnosis not present

## 2017-05-05 DIAGNOSIS — I110 Hypertensive heart disease with heart failure: Secondary | ICD-10-CM | POA: Diagnosis not present

## 2017-05-05 DIAGNOSIS — G4700 Insomnia, unspecified: Secondary | ICD-10-CM | POA: Insufficient documentation

## 2017-05-05 MED ORDER — BACLOFEN 10 MG PO TABS: 5.0000 mg | ORAL_TABLET | Freq: Three times a day (TID) | ORAL | 4 refills | Status: DC | PRN

## 2017-05-05 MED ORDER — METHOCARBAMOL 500 MG PO TABS: 500.0000 mg | ORAL_TABLET | Freq: Three times a day (TID) | ORAL | 4 refills | Status: DC | PRN

## 2017-05-05 MED ORDER — PREDNISONE 10 MG PO TABS: 10.0000 mg | ORAL_TABLET | ORAL | 0 refills | Status: DC

## 2017-05-05 NOTE — Progress Notes (Signed)
Subjective:    Patient ID: Lisa [name], female    DOB: 01/06/1927, 83 y.o.   MRN: [id number]  HPI Lisa [name] is here in follow up of her TBI and chronic pain. I sent her for left L4 MBB, L5 DR injection two weeks ago which were performed by Dr. Wynn BankerKirsteins. These did not provide the patient much relief. She developed worsening pain early this week and ended up going to the ED yesterday. A CT of the lumbar spine was performed which revealed:  T12-L1: Normal appearing disc. Comminuted fracture of L1, benign-appearing.  L1-2: Diffuse bulge of the disc. Slight protrusion of the posterior inferior aspect of L1 into the spinal canal secondary to the compression fracture. No focal neural impingement. Small paraspinal hematoma.  L2-3: Chronic disc space narrowing with degenerative changes of the vertebral endplates. Previously demonstrated broad-based soft disc protrusion slightly compressing the thecal sac.  L3-4: Mild broad-based soft disc protrusion with hypertrophy of the ligamentum flavum combine to create moderate spinal stenosis. This appears increased in severity since the prior CT myelogram.  L4-5: 5 mm spondylolisthesis with a broad-based bulge of the uncovered disc without focal neural impingement. Severe bilateral facet arthritis. The findings are unchanged.  L5-S1: Chronic marked disc space narrowing. Small broad-based disc bulge without neural impingement. No foraminal stenosis.  Pt was given decadron prior to departure from the ED which helped make her pain more bearable. Radiculopathy was mentioned in the note from the ED physician although Lisa [name] denies any radiation down the legs. Her pain is at the waist line R>L. It bothers her with standing and sitting. It has made transfers difficult. She feels that her legs are "shaky" but so are her arms. None of her medications are currently providing any relief.     Pain Inventory Average Pain 7 Pain Right Now 7 My pain is  sharp and aching  In the last 24 hours, has pain interfered with the following? General activity 1 Relation with others 0 Enjoyment of life 0 What TIME of day is your pain at its worst? all Sleep (in general) Poor  Pain is worse with: walking, sitting and standing Pain improves with: heat/ice and injections Relief from Meds: 2  Mobility walk with assistance use a walker ability to climb steps?  no do you drive?  no use a wheelchair  Function not employed: date last employed . retired  Neuro/Psych weakness tremor  Prior Studies Any changes since last visit?  no  Physicians involved in your care Any changes since last visit?  no   Family History  Problem Relation Age of Onset  . COPD Mother   . COPD Father   . Heart disease Father    Social History   Social History  . Marital status: Widowed    Spouse name: N/A  . Number of children: N/A  . Years of education: N/A   Occupational History  . retired    Social History Main Topics  . Smoking status: Never Smoker  . Smokeless tobacco: Never Used  . Alcohol use No  . Drug use: No  . Sexual activity: Not Asked   Other Topics Concern  . None   Social History Narrative  . None   Past Surgical History:  Procedure Laterality Date  . BACK SURGERY N/A   . BREAST LUMPECTOMY N/A    Past Medical History:  Diagnosis Date  . Allergy   . Brain bleed (HCC)   . Chronic back pain   .  Chronic systolic CHF (congestive heart failure) (HCC)   . Depression   . Foot fracture    bilateral  . Hypertension   . Osteoarthritis   . Pelvis fracture (HCC)   . Renal disorder   . Spinal stenosis   . V-tach (HCC)   . Wrist fracture, right    BP (!) 155/75 (BP Location: Right Arm, Patient Position: Sitting, Cuff Size: Normal)   Pulse 71   Resp 14   SpO2 96%   Opioid Risk Score:   Fall Risk Score:  `1  Depression screen PHQ 2/9  Depression screen [facility] 2/9 11/14/2016 10/22/2015 07/24/2015 07/16/2015 06/26/2015  05/22/2015 04/23/2015  Decreased Interest 1 0 0 0 0 0 0  Down, Depressed, Hopeless 1 0 0 0 0 0 0  PHQ - 2 Score 2 0 0 0 0 0 0  Altered sleeping 2 0 - - - - -  Tired, decreased energy 1 0 - - - - -  Change in appetite 0 0 - - - - -  Feeling bad or failure about yourself  0 0 - - - - -  Trouble concentrating 0 0 - - - - -  Moving slowly or fidgety/restless 0 0 - - - - -  Suicidal thoughts 0 0 - - - - -  PHQ-9 Score 5 0 - - - - -  Some recent data might be hidden    Review of Systems  HENT: Negative.   Eyes: Negative.   Respiratory: Positive for cough and wheezing.   Cardiovascular: Negative.   Gastrointestinal: Negative.   Endocrine: Negative.        High blood sugar  Genitourinary: Negative.   Musculoskeletal: Positive for back pain.  Skin: Negative.   Allergic/Immunologic: Negative.   Neurological: Positive for tremors and weakness.  Hematological: Bruises/bleeds easily.  Psychiatric/Behavioral: Negative.   All other systems reviewed and are negative.      Objective:   Physical Exam  HENT:  Head: Normocephalic and atraumatic.  Right Ear: No decreased hearing is noted.  Left Ear: No decreased hearing is noted.  Mouth/Throat: oral membranes moist Eyes: Conjunctivae are normal. Pupils are equal, round, and reactive to light.  Neck: Normal range of motion. Neck supple.  Cardiovascular: RRR Respiratory: normal effort  GI: Soft. Bowel sounds are normal. She exhibits no distension. There is no tenderness. There is no rebound.  Musculoskeletal: Low back TTP, most prominent around waist line. Muscle spasms appreciated on the right more than left. Pain worse with flexion today although extension caused some discomfort.  Neurological: alert and oriented. Reasonable insight and awareness. Balance functional with walker. Motor 5/5 UE's. LE's 4/5 at hips/knees partly due to pain. 5/5 ankles--no motor changes. Does have a regular tremor in the uppers and lowers.  Sensation intact  to light touch in bilateral upper and lower limbs--stable.  Skin: intact without breakdown  Psych: pleasant and appropriate.    Assessment & Plan:  Medical Problem List and Plan: 1. 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  -pt near cognitive baseline  3. Chronic back pain/Pain Management: lumbar post lami syndrome. Last [facility] 07/16/15 by Dr. Metta Clines.  - she has multiple potential pain generators as evidenced by this most recent CT. It appears that she's has NOT had relief with recent MBB's although the CT from 8/13 revealed severe facet disease at L4-5 bilaterally.  Given the pronounced degenerative disc disease at L4-5 and L5-S1, lack of results with  recent MBB,  and her exam today, however, I will refer Lisa [name] for a L4-5 translaminar injection per Dr. Wynn Banker -In the meantime, I wrote her a brief prednisone taper starting at 10mg  TID as well as baclofen 5mg  Q8 prn for spams.  -she would need to come off eliquis prior to procedure. Dr. Kirke Corin is her cardiologist                -continue ice/heat/massage for pain relief             -limit narcs/neurosedating meds given her age/poor tolerance 4. Mood: LCSW to follow for evaluation and support.   5. Insomnia, ?mild sundowning? - low dose risperdal for sleep/mood, 0.25mg  qhs #30.  6. Right shoulder pain: likely RTC tendonitis. Maintain HEP 7. Depression: celexa   Follow up with Dr. Wynn Banker in about 2 weeks. of face to face patient care time were spent during this visit. All questions were encouraged and answered.

## 2017-05-05 NOTE — Telephone Encounter (Signed)
Insurance will not cover methocarbamol for ages 28>65 so per Dr Riley KillSwartz she can have baclofen 5 mg 1 tab q 8 hour prn #60.  Called to [facility] as Baclofen 10 mg 1/2 tablet q 8 hours prn #30 w 4 RF.

## 2017-05-05 NOTE — Patient Instructions (Signed)
PLEASE FEEL FREE TO CALL OUR OFFICE WITH ANY PROBLEMS OR QUESTIONS ([phone])      

## 2017-05-05 NOTE — Telephone Encounter (Signed)
Peggy RN called from ED in [place] and was calling to speak  to Dr Riley KillSwartz or Dr Wynn BankerKirsteins.  [phone]) Cell [phone]  She is talking about the CT scan done and apparent compression fracture and severe bulging causing her unmanageable pain. Visit in [facility]. They are wanting her to be seen this week. The call was from yesterday in ED. Please review scan and advise.

## 2017-05-08 ENCOUNTER — Telehealth: Payer: Self-pay

## 2017-05-08 NOTE — Telephone Encounter (Signed)
She was running a low grad fever and so now they are wondering if maybe she just picked up a bug in the ED or MD office.  The taper dose has been distributed in her pill box so they will continue with the tapering unless more of a problem and then will go to the 10 mg daily like suggested.

## 2017-05-08 NOTE — Telephone Encounter (Signed)
She received decadron without any issue in ED. If prednisone is helping, I would like her to try to continue taking, perhaps decreasing to only 10mg  daily ---can continue that for 5 days and then stop

## 2017-05-08 NOTE — Telephone Encounter (Signed)
Jean Rosenthal RN home and stead called  [phone] , states after this patient had took prednisone she has had episodes of diarrhea and vomiting, this has happened twice now, this occurred yesterday and day before, she advised patient to drink milk before taking medication if that helps, wanting to know if there is further instructions on this  Please advise

## 2017-05-12 ENCOUNTER — Ambulatory Visit: Payer: Medicare Other | Admitting: Physical Medicine & Rehabilitation

## 2017-05-13 ENCOUNTER — Observation Stay
Admission: EM | Admit: 2017-05-13 | Discharge: 2017-05-15 | Disposition: A | Discharge status: Skilled Nursing Facility | Payer: Medicare Other | Attending: Internal Medicine | Admitting: Internal Medicine

## 2017-05-13 ENCOUNTER — Emergency Department: Payer: Medicare Other

## 2017-05-13 DIAGNOSIS — B9620 Unspecified Escherichia coli [E. coli] as the cause of diseases classified elsewhere: Secondary | ICD-10-CM | POA: Insufficient documentation

## 2017-05-13 DIAGNOSIS — M4800 Spinal stenosis, site unspecified: Secondary | ICD-10-CM | POA: Diagnosis not present

## 2017-05-13 DIAGNOSIS — Z885 Allergy status to narcotic agent status: Secondary | ICD-10-CM | POA: Insufficient documentation

## 2017-05-13 DIAGNOSIS — I482 Chronic atrial fibrillation: Secondary | ICD-10-CM | POA: Diagnosis not present

## 2017-05-13 DIAGNOSIS — Z8249 Family history of ischemic heart disease and other diseases of the circulatory system: Secondary | ICD-10-CM | POA: Insufficient documentation

## 2017-05-13 DIAGNOSIS — R1310 Dysphagia, unspecified: Secondary | ICD-10-CM | POA: Insufficient documentation

## 2017-05-13 DIAGNOSIS — I472 Ventricular tachycardia: Secondary | ICD-10-CM | POA: Insufficient documentation

## 2017-05-13 DIAGNOSIS — K625 Hemorrhage of anus and rectum: Secondary | ICD-10-CM | POA: Insufficient documentation

## 2017-05-13 DIAGNOSIS — Z8673 Personal history of transient ischemic attack (TIA), and cerebral infarction without residual deficits: Secondary | ICD-10-CM | POA: Diagnosis not present

## 2017-05-13 DIAGNOSIS — E039 Hypothyroidism, unspecified: Secondary | ICD-10-CM | POA: Insufficient documentation

## 2017-05-13 DIAGNOSIS — F329 Major depressive disorder, single episode, unspecified: Secondary | ICD-10-CM | POA: Insufficient documentation

## 2017-05-13 DIAGNOSIS — N3000 Acute cystitis without hematuria: Secondary | ICD-10-CM

## 2017-05-13 DIAGNOSIS — F5105 Insomnia due to other mental disorder: Secondary | ICD-10-CM | POA: Diagnosis not present

## 2017-05-13 DIAGNOSIS — S2241XA Multiple fractures of ribs, right side, initial encounter for closed fracture: Secondary | ICD-10-CM | POA: Diagnosis not present

## 2017-05-13 DIAGNOSIS — W19XXXA Unspecified fall, initial encounter: Secondary | ICD-10-CM

## 2017-05-13 DIAGNOSIS — K219 Gastro-esophageal reflux disease without esophagitis: Secondary | ICD-10-CM | POA: Insufficient documentation

## 2017-05-13 DIAGNOSIS — N390 Urinary tract infection, site not specified: Secondary | ICD-10-CM | POA: Diagnosis not present

## 2017-05-13 DIAGNOSIS — I420 Dilated cardiomyopathy: Secondary | ICD-10-CM | POA: Insufficient documentation

## 2017-05-13 DIAGNOSIS — F419 Anxiety disorder, unspecified: Secondary | ICD-10-CM | POA: Insufficient documentation

## 2017-05-13 DIAGNOSIS — I5022 Chronic systolic (congestive) heart failure: Secondary | ICD-10-CM | POA: Insufficient documentation

## 2017-05-13 DIAGNOSIS — M961 Postlaminectomy syndrome, not elsewhere classified: Secondary | ICD-10-CM | POA: Insufficient documentation

## 2017-05-13 DIAGNOSIS — Z91013 Allergy to seafood: Secondary | ICD-10-CM | POA: Insufficient documentation

## 2017-05-13 DIAGNOSIS — N179 Acute kidney failure, unspecified: Secondary | ICD-10-CM | POA: Insufficient documentation

## 2017-05-13 DIAGNOSIS — F0390 Unspecified dementia without behavioral disturbance: Secondary | ICD-10-CM | POA: Diagnosis not present

## 2017-05-13 DIAGNOSIS — Z888 Allergy status to other drugs, medicaments and biological substances status: Secondary | ICD-10-CM | POA: Insufficient documentation

## 2017-05-13 DIAGNOSIS — M8580 Other specified disorders of bone density and structure, unspecified site: Secondary | ICD-10-CM | POA: Insufficient documentation

## 2017-05-13 DIAGNOSIS — R7989 Other specified abnormal findings of blood chemistry: Secondary | ICD-10-CM

## 2017-05-13 DIAGNOSIS — Z7901 Long term (current) use of anticoagulants: Secondary | ICD-10-CM | POA: Insufficient documentation

## 2017-05-13 DIAGNOSIS — R778 Other specified abnormalities of plasma proteins: Secondary | ICD-10-CM

## 2017-05-13 DIAGNOSIS — E876 Hypokalemia: Secondary | ICD-10-CM | POA: Diagnosis not present

## 2017-05-13 DIAGNOSIS — I130 Hypertensive heart and chronic kidney disease with heart failure and stage 1 through stage 4 chronic kidney disease, or unspecified chronic kidney disease: Secondary | ICD-10-CM | POA: Diagnosis not present

## 2017-05-13 DIAGNOSIS — R531 Weakness: Secondary | ICD-10-CM | POA: Diagnosis not present

## 2017-05-13 DIAGNOSIS — M48062 Spinal stenosis, lumbar region with neurogenic claudication: Secondary | ICD-10-CM | POA: Insufficient documentation

## 2017-05-13 DIAGNOSIS — G4700 Insomnia, unspecified: Secondary | ICD-10-CM | POA: Insufficient documentation

## 2017-05-13 DIAGNOSIS — K5900 Constipation, unspecified: Secondary | ICD-10-CM | POA: Insufficient documentation

## 2017-05-13 DIAGNOSIS — M47816 Spondylosis without myelopathy or radiculopathy, lumbar region: Secondary | ICD-10-CM

## 2017-05-13 DIAGNOSIS — X58XXXS Exposure to other specified factors, sequela: Secondary | ICD-10-CM | POA: Diagnosis not present

## 2017-05-13 DIAGNOSIS — E870 Hyperosmolality and hypernatremia: Secondary | ICD-10-CM | POA: Insufficient documentation

## 2017-05-13 DIAGNOSIS — M1990 Unspecified osteoarthritis, unspecified site: Secondary | ICD-10-CM | POA: Diagnosis not present

## 2017-05-13 DIAGNOSIS — N183 Chronic kidney disease, stage 3 (moderate): Secondary | ICD-10-CM | POA: Diagnosis not present

## 2017-05-13 DIAGNOSIS — Z825 Family history of asthma and other chronic lower respiratory diseases: Secondary | ICD-10-CM | POA: Insufficient documentation

## 2017-05-13 DIAGNOSIS — M549 Dorsalgia, unspecified: Secondary | ICD-10-CM | POA: Insufficient documentation

## 2017-05-13 DIAGNOSIS — R748 Abnormal levels of other serum enzymes: Secondary | ICD-10-CM | POA: Diagnosis present

## 2017-05-13 DIAGNOSIS — M4856XA Collapsed vertebra, not elsewhere classified, lumbar region, initial encounter for fracture: Secondary | ICD-10-CM | POA: Insufficient documentation

## 2017-05-13 DIAGNOSIS — R262 Difficulty in walking, not elsewhere classified: Secondary | ICD-10-CM | POA: Diagnosis present

## 2017-05-13 DIAGNOSIS — G8929 Other chronic pain: Secondary | ICD-10-CM | POA: Insufficient documentation

## 2017-05-13 DIAGNOSIS — R05 Cough: Secondary | ICD-10-CM

## 2017-05-13 DIAGNOSIS — M79675 Pain in left toe(s): Secondary | ICD-10-CM

## 2017-05-13 DIAGNOSIS — M533 Sacrococcygeal disorders, not elsewhere classified: Secondary | ICD-10-CM

## 2017-05-13 DIAGNOSIS — Z9181 History of falling: Secondary | ICD-10-CM | POA: Insufficient documentation

## 2017-05-13 DIAGNOSIS — M4726 Other spondylosis with radiculopathy, lumbar region: Secondary | ICD-10-CM | POA: Insufficient documentation

## 2017-05-13 DIAGNOSIS — R059 Cough, unspecified: Secondary | ICD-10-CM

## 2017-05-13 LAB — URINALYSIS, COMPLETE (UACMP) WITH MICROSCOPIC
Bacteria, UA: NONE SEEN
Bilirubin Urine: NEGATIVE
GLUCOSE, UA: NEGATIVE mg/dL
KETONES UR: NEGATIVE mg/dL
Nitrite: POSITIVE — AB
PH: 5 (ref 5.0–8.0)
Protein, ur: NEGATIVE mg/dL
SQUAMOUS EPITHELIAL / LPF: NONE SEEN
Specific Gravity, Urine: 1.012 (ref 1.005–1.030)

## 2017-05-13 LAB — CBC
HCT: 30.6 % — ABNORMAL LOW (ref 35.0–47.0)
Hemoglobin: 9.7 g/dL — ABNORMAL LOW (ref 12.0–16.0)
MCH: 27.2 pg (ref 26.0–34.0)
MCHC: 31.8 g/dL — ABNORMAL LOW (ref 32.0–36.0)
MCV: 85.7 fL (ref 80.0–100.0)
PLATELETS: 404 10*3/uL (ref 150–440)
RBC: 3.57 MIL/uL — AB (ref 3.80–5.20)
RDW: 20 % — AB (ref 11.5–14.5)
WBC: 12.4 10*3/uL — AB (ref 3.6–11.0)

## 2017-05-13 LAB — BASIC METABOLIC PANEL
Anion gap: 10 (ref 5–15)
BUN: 57 mg/dL — AB (ref 6–20)
CALCIUM: 8.8 mg/dL — AB (ref 8.9–10.3)
CHLORIDE: 109 mmol/L (ref 101–111)
CO2: 22 mmol/L (ref 22–32)
CREATININE: 2.37 mg/dL — AB (ref 0.44–1.00)
GFR calc non Af Amer: 17 mL/min — ABNORMAL LOW (ref >60)
GFR, EST AFRICAN AMERICAN: 20 mL/min — AB (ref >60)
Glucose, Bld: 92 mg/dL (ref 65–99)
Potassium: 4.3 mmol/L (ref 3.5–5.1)
SODIUM: 141 mmol/L (ref 135–145)

## 2017-05-13 LAB — TROPONIN I: Troponin I: 0.03 ng/mL (ref <0.03)

## 2017-05-13 MED ORDER — CEFTRIAXONE SODIUM 1 G IJ SOLR
1.0000 g | Freq: Once | INTRAMUSCULAR | Status: AC
  Administered 2017-05-13: 1 g via INTRAVENOUS
  Filled 2017-05-13: qty 10

## 2017-05-13 MED ORDER — APIXABAN 2.5 MG PO TABS
2.5000 mg | ORAL_TABLET | Freq: Two times a day (BID) | ORAL | Status: DC
  Administered 2017-05-13 – 2017-05-15 (×4): 2.5 mg via ORAL
  Filled 2017-05-13 (×4): qty 1

## 2017-05-13 MED ORDER — TRAMADOL HCL 50 MG PO TABS
50.0000 mg | ORAL_TABLET | Freq: Two times a day (BID) | ORAL | Status: DC
  Administered 2017-05-13 – 2017-05-15 (×4): 50 mg via ORAL
  Filled 2017-05-13 (×4): qty 1

## 2017-05-13 MED ORDER — ONDANSETRON HCL 4 MG/2ML IJ SOLN
4.0000 mg | Freq: Once | INTRAMUSCULAR | Status: AC
  Administered 2017-05-13: 4 mg via INTRAVENOUS
  Filled 2017-05-13: qty 2

## 2017-05-13 MED ORDER — IRBESARTAN 150 MG PO TABS
150.0000 mg | ORAL_TABLET | Freq: Every day | ORAL | Status: DC
  Administered 2017-05-14 – 2017-05-15 (×2): 150 mg via ORAL
  Filled 2017-05-13 (×2): qty 1

## 2017-05-13 MED ORDER — ACETAMINOPHEN 325 MG PO TABS: 650.0000 mg | ORAL_TABLET | Freq: Four times a day (QID) | ORAL | Status: DC | PRN

## 2017-05-13 MED ORDER — SODIUM CHLORIDE 0.9 % IV BOLUS (SEPSIS)
1000.0000 mL | Freq: Once | INTRAVENOUS | Status: AC
  Administered 2017-05-13: 1000 mL via INTRAVENOUS

## 2017-05-13 MED ORDER — RALOXIFENE HCL 60 MG PO TABS
60.0000 mg | ORAL_TABLET | Freq: Every day | ORAL | Status: DC
  Administered 2017-05-14 – 2017-05-15 (×2): 60 mg via ORAL
  Filled 2017-05-13 (×3): qty 1

## 2017-05-13 MED ORDER — RISPERIDONE 0.25 MG PO TABS
0.2500 mg | ORAL_TABLET | Freq: Every day | ORAL | Status: DC
  Administered 2017-05-13 – 2017-05-14 (×2): 0.25 mg via ORAL
  Filled 2017-05-13 (×3): qty 1

## 2017-05-13 MED ORDER — BACLOFEN 5 MG HALF TABLET
5.0000 mg | ORAL_TABLET | Freq: Three times a day (TID) | ORAL | Status: DC | PRN
  Filled 2017-05-13: qty 1

## 2017-05-13 MED ORDER — GABAPENTIN 100 MG PO CAPS
100.0000 mg | ORAL_CAPSULE | Freq: Three times a day (TID) | ORAL | Status: DC
  Administered 2017-05-13 – 2017-05-15 (×5): 100 mg via ORAL
  Filled 2017-05-13 (×5): qty 1

## 2017-05-13 MED ORDER — ASPIRIN 81 MG PO CHEW: 324.0000 mg | CHEWABLE_TABLET | Freq: Once | ORAL | Status: DC

## 2017-05-13 MED ORDER — LEVOTHYROXINE SODIUM 25 MCG PO TABS
25.0000 ug | ORAL_TABLET | Freq: Every day | ORAL | Status: DC
  Administered 2017-05-14 – 2017-05-15 (×2): 25 ug via ORAL
  Filled 2017-05-13 (×2): qty 1

## 2017-05-13 MED ORDER — ONDANSETRON HCL 4 MG/2ML IJ SOLN: 4.0000 mg | Freq: Four times a day (QID) | INTRAMUSCULAR | Status: DC | PRN

## 2017-05-13 MED ORDER — CITALOPRAM HYDROBROMIDE 20 MG PO TABS
20.0000 mg | ORAL_TABLET | Freq: Every day | ORAL | Status: DC
  Administered 2017-05-14 – 2017-05-15 (×2): 20 mg via ORAL
  Filled 2017-05-13 (×2): qty 1

## 2017-05-13 MED ORDER — DEXTROSE 5 % IV SOLN
1.0000 g | INTRAVENOUS | Status: DC
  Administered 2017-05-14 – 2017-05-15 (×2): 1 g via INTRAVENOUS
  Filled 2017-05-13 (×3): qty 10

## 2017-05-13 MED ORDER — PANTOPRAZOLE SODIUM 40 MG PO TBEC
40.0000 mg | DELAYED_RELEASE_TABLET | Freq: Every day | ORAL | Status: DC
  Administered 2017-05-14 – 2017-05-15 (×2): 40 mg via ORAL
  Filled 2017-05-13 (×2): qty 1

## 2017-05-13 MED ORDER — SENNOSIDES-DOCUSATE SODIUM 8.6-50 MG PO TABS
2.0000 | ORAL_TABLET | Freq: Two times a day (BID) | ORAL | Status: DC
  Administered 2017-05-13 – 2017-05-15 (×3): 2 via ORAL
  Filled 2017-05-13 (×4): qty 2

## 2017-05-13 MED ORDER — METOPROLOL TARTRATE 25 MG PO TABS
12.5000 mg | ORAL_TABLET | Freq: Two times a day (BID) | ORAL | Status: DC
  Administered 2017-05-13 – 2017-05-15 (×3): 12.5 mg via ORAL
  Filled 2017-05-13 (×4): qty 1

## 2017-05-13 MED ORDER — ONDANSETRON HCL 4 MG PO TABS: 4.0000 mg | ORAL_TABLET | Freq: Four times a day (QID) | ORAL | Status: DC | PRN

## 2017-05-13 MED ORDER — ACETAMINOPHEN 650 MG RE SUPP: 650.0000 mg | Freq: Four times a day (QID) | RECTAL | Status: DC | PRN

## 2017-05-13 MED ORDER — FUROSEMIDE 20 MG PO TABS
20.0000 mg | ORAL_TABLET | Freq: Every day | ORAL | Status: DC
  Administered 2017-05-14 – 2017-05-15 (×2): 20 mg via ORAL
  Filled 2017-05-13 (×2): qty 1

## 2017-05-13 MED ORDER — LIDOCAINE 5 % EX PTCH
1.0000 | MEDICATED_PATCH | Freq: Two times a day (BID) | CUTANEOUS | Status: DC | PRN
  Filled 2017-05-13: qty 1

## 2017-05-13 MED ORDER — AMIODARONE HCL 200 MG PO TABS
100.0000 mg | ORAL_TABLET | Freq: Every day | ORAL | Status: DC
  Administered 2017-05-14 – 2017-05-15 (×2): 100 mg via ORAL
  Filled 2017-05-13 (×2): qty 1

## 2017-05-13 MED ORDER — MIRTAZAPINE 15 MG PO TABS
15.0000 mg | ORAL_TABLET | Freq: Every day | ORAL | Status: DC
  Administered 2017-05-13 – 2017-05-14 (×2): 15 mg via ORAL
  Filled 2017-05-13 (×2): qty 1

## 2017-05-13 NOTE — Progress Notes (Signed)
RN assumed care at 1730, patient alert and oriented. At home care-giver at bedside. Patient states she has not had a bowel movement since this morning when she was home and that it was a soft bowel movement. Denies abdominal pain. States she has had some falls at home. Bed alarm in place. Oriented to room and call bell system. Caregivers educated about enteric precautions and to wear gowns and wash hands when leaving the room.   Harvie Heck, RN

## 2017-05-13 NOTE — ED Notes (Signed)
Date and time results received: 05/13/17 2:26 PM  Test: Troponin Critical Value: 0.03  Name of Provider Notified: Dr. Sharma Covert  Orders Received? Or Actions Taken?: acknowledged

## 2017-05-13 NOTE — ED Provider Notes (Signed)
[facility] Emergency Department Provider Note  ____________________________________________  Time seen: Approximately 1:02 PM  I have reviewed the triage vital signs and the nursing notes.   HISTORY  Chief Complaint Shaking and Weakness    HPI Lisa [name] is a 83 y.o. female with a history of dementia, remote intracranial bleed, her current UTI, presenting with generalized weakness, diarrhea, and shaking chills. The patient reports that for the past 5 days, she has had 1-2 episodes of loose stool daily. No recent antibiotics or known sick contacts. In addition she had 1 episode of vomiting several days ago. She has not had any abdominal discomfort or pain, fevers, or urinary symptoms. This morning, the patient states she felt "weak as water," so she came in for further evaluation. In addition, the patient's caregiver notes that she has been having a nonproductive cough which is new without any congestion or rhinorrhea, sore throat or ear pain.   Past Medical History:  Diagnosis Date  . Allergy   . Brain bleed (HCC)   . Chronic back pain   . Chronic systolic CHF (congestive heart failure) (HCC)   . Depression   . Foot fracture    bilateral  . Hypertension   . Osteoarthritis   . Pelvis fracture (HCC)   . Renal disorder   . Spinal stenosis   . V-tach (HCC)   . Wrist fracture, right     Patient Active Problem List   Diagnosis Date Noted  . Spondylosis without myelopathy or radiculopathy, lumbar region 05/05/2017  . Right shoulder pain 09/23/2016  . New onset atrial fibrillation (HCC) 12/11/2015  . Altered mental state   . Congestive dilated cardiomyopathy (HCC)   . Dementia   . Headache 12/10/2015  . TIA (transient ischemic attack) 12/10/2015  . Influenza A 11/21/2015  . Ventricular tachycardia (HCC) 11/09/2015  . Constipation 09/12/2015  . Insomnia due to anxiety and fear 09/12/2015  . Depression, controlled 09/12/2015  . Bacterial UTI  08/27/2015  . DVT, lower extremity (HCC) 08/27/2015  . TBI (traumatic brain injury) (HCC) 08/22/2015  . ICH (intracerebral hemorrhage) (HCC)   . Abscess   . Hypomagnesemia   . SDH (subdural hematoma) (HCC)   . Boil of upper extremity   . Pneumonia due to Haemophilus influenzae (HCC)   . Chronic systolic CHF (congestive heart failure) (HCC)   . Acute on chronic renal failure (HCC)   . Hypokalemia   . Bright red blood per rectum   . Dysphagia   . Pressure ulcer 08/16/2015  . Acute respiratory failure with hypoxemia (HCC)   . Hypernatremia   . Subdural hematoma (HCC) 08/03/2015  . Lumbar radiculopathy 06/06/2015  . Spinal stenosis, lumbar region, with neurogenic claudication 04/02/2015  . Degenerative lumbar disc 02/15/2015  . Lumbar post-laminectomy syndrome 02/15/2015  . Facet syndrome, lumbar (HCC) 02/15/2015  . Sacroiliac joint dysfunction 02/15/2015    Past Surgical History:  Procedure Laterality Date  . BACK SURGERY N/A   . BREAST LUMPECTOMY N/A     Current Outpatient Rx  . Order #: [id number] Class: No Print  . Order #: [id number] Class: Normal  . Order #: [id number] Class: Phone In  . Order #: [id number] Class: Historical Med  . Order #: [id number] Class: Normal  . Order #: [id number] Class: Normal  . Order #: [id number] Class: Normal  . Order #: [id number] Class: Historical Med  . Order #: [id number] Class: Historical Med  . Order #: [id number] Class: Print  . Order #: [id number] Class: Print  . Order #: [id number] Class: Historical Med  .  Order #: [id number] Class: Historical Med  . Order #: [id number] Class: Historical Med  . Order #: [id number] Class: Historical Med  . Order #: [id number] Class: Print  . Order #: [id number] Class: Historical Med  . Order #: [id number] Class: Normal  . Order #: [id number] Class: No Print  . Order #: [id number] Class: Phone In    Allergies Ambien [zolpidem tartrate]; Daypro [oxaprozin]; Oxycodone; Shellfish allergy; Ace inhibitors; and Norvasc [amlodipine  besylate]  Family History  Problem Relation Age of Onset  . COPD Mother   . COPD Father   . Heart disease Father     Social History Social History  Substance Use Topics  . Smoking status: Never Smoker  . Smokeless tobacco: Never Used  . Alcohol use No    Review of Systems Constitutional: Positive shaking chills. Positive general malaise and weakness. No fever. Eyes: No visual changes. No eye discharge. ENT: No sore throat. No congestion or rhinorrhea. No ear pain. Cardiovascular: Denies chest pain. Denies palpitations. Respiratory: Denies shortness of breath.  Positive cough. Gastrointestinal: No abdominal pain.  Positive nausea, +1 episode vomiting.  Positive diarrhea.  No constipation. Genitourinary: Negative for dysuria. Musculoskeletal: Positive for chronic unchanged back pain. Skin: Negative for rash. Neurological: Negative for headaches. No focal numbness, tingling or weakness.     ____________________________________________   PHYSICAL EXAM:  VITAL SIGNS: ED Triage Vitals  Enc Vitals Group     BP 05/13/17 1223 (!) 157/64     Pulse Rate 05/13/17 1223 74     Resp 05/13/17 1223 17     Temp 05/13/17 1223 97.6 F (36.4 C)     Temp Source 05/13/17 1223 Oral     SpO2 05/13/17 1223 100 %     Weight 05/13/17 1223 170 lb (77.1 kg)     Height 05/13/17 1223 5\' 3"  (1.6 m)     Head Circumference --      Peak Flow --      Pain Score 05/13/17 1222 4     Pain Loc --      Pain Edu? --      Excl. in GC? --     Constitutional: The patient is alert and answers questions appropriately. She is chronically ill appearing but nontoxic.  Eyes: Conjunctivae are normal.  EOMI. No scleral icterus. No eye discharge. Head: Atraumatic. Nose: No congestion/rhinnorhea. Mouth/Throat: Mucous membranes are moist.  Neck: No stridor.  Supple.  No JVD. Meningismus. Cardiovascular: Normal rate, regular rhythm. No murmurs, rubs or gallops.  Respiratory: Normal respiratory effort.  No  accessory muscle use or retractions. Lungs CTAB.  No wheezes, rales or ronchi. Gastrointestinal: Obese. Soft, nontender and nondistended.  No guarding or rebound.  No peritoneal signs. Musculoskeletal: No LE edema. No ttp in the calves or palpable cords.  Negative Homan's sign. Neurologic: alert. Speech is clear.  Face and smile are symmetric.  EOMI.  Moves all extremities well. Skin:  Skin is warm, dry and intact. No rash noted. Psychiatric: Mood and affect are normal. Speech and behavior are normal.  Normal judgement  ____________________________________________   LABS (all labs ordered are listed, but only abnormal results are displayed)  Labs Reviewed  BASIC METABOLIC PANEL - Abnormal; Notable for the following:       Result Value   BUN 57 (*)    Creatinine, Ser 2.37 (*)    Calcium 8.8 (*)    GFR calc non Af Amer 17 (*)    GFR calc Af Amer 20 (*)    All other components within normal limits  CBC - Abnormal; Notable for the following:    WBC 12.4 (*)    RBC 3.57 (*)    Hemoglobin 9.7 (*)    HCT 30.6 (*)    MCHC 31.8 (*)    RDW 20.0 (*)    All other components within normal limits  URINALYSIS, COMPLETE (UACMP) WITH MICROSCOPIC - Abnormal; Notable for the following:    Color, Urine YELLOW (*)    APPearance CLOUDY (*)    Hgb urine dipstick MODERATE (*)    Nitrite POSITIVE (*)    Leukocytes, UA LARGE (*)    All other components within normal limits  TROPONIN I - Abnormal; Notable for the following:    Troponin I 0.03 (*)    All other components within normal limits  GASTROINTESTINAL PANEL BY PCR, STOOL (REPLACES STOOL CULTURE)  C DIFFICILE QUICK SCREEN W PCR REFLEX  URINE CULTURE  CBG MONITORING, ED   ____________________________________________  EKG  ED ECG REPORT I, Rockne Menghini, the attending physician, personally viewed and interpreted this ECG.   Date: 05/13/2017  EKG Time: 1223  Rate: 73  Rhythm: normal sinus rhythm  Axis: leftward   Intervals:none  ST&T Change: + LBBB; negative Sgarbosa criteria.  This EKG is compared to 6/18, with old left bundle branch block and no acute changes in morphology.  ____________________________________________  RADIOLOGY  Dg Chest 2 View  Result Date: 05/13/2017 CLINICAL DATA:  Coughing and vomiting. EXAM: CHEST  2 VIEW COMPARISON:  12/13/2015 FINDINGS: The heart is mildly enlarged but stable. There is mild tortuosity of the thoracic aorta. The pulmonary hila appear normal. The lungs are clear of an acute process. Mild chronic bronchitic changes and chronic apical pleural and parenchymal scarring. Right-sided rib fractures are noted. Some of these appear chronic. The sixth right posterior rib fracture could be acute. There is a severe compression deformity of L1. This is new since a prior chest x-ray from 2017. IMPRESSION: No acute cardiopulmonary findings. Remote and possibly acute right rib fractures. L1 compression fracture of uncertain age but new since chest x-rays from 2017. Electronically Signed   By: Rudie Meyer M.D.   On: 05/13/2017 14:02    ____________________________________________   PROCEDURES  Procedure(s) performed: None  Procedures  Critical Care performed: No ____________________________________________   INITIAL IMPRESSION / ASSESSMENT AND PLAN / ED COURSE  Pertinent labs & imaging results that were available during my care of the patient were reviewed by me and considered in my medical decision making (see chart for details).  83 y.o. female presenting with several days of 1-2 episodes daily of loose stool, one episode of vomiting several days ago, with general weakness and malaise today. On my examination, the patient is hemodynamically stable and afebrile. In addition, her abdomen does not show any evidence of acute intra-abdominal infection. We'll get basic labs, treat the patient symptomatically, get stool samples if she is able to provide them. We'll also  check for urinary tract infection as a cause of her symptoms. A chest x-ray has been ordered to rule out pneumonia Plan reevaluation for final disposition.  ----------------------------------------- 2:43 PM on 05/13/2017 -----------------------------------------  The patient does have evidence of a UTI although there are no bacteria that are seen; she does have white blood cell count, and leukocytes and nitrites. I've ordered Rocephin for her UTI. Her caregiver is now here, and states the patient has fallen twice over the last 2 days from tries weakness. At baseline, she uses a walker but has been unable to get  out of the wheelchair. We'll plan to admit the patient for further evaluation.  The patient does have a troponin of 0.03, which appears to be chronic for her. This will need to be trended by the hospitalist. An aspirin has been ordered.  ____________________________________________  FINAL CLINICAL IMPRESSION(S) / ED DIAGNOSES  Final diagnoses:  Acute cystitis without hematuria  Inability to walk  Fall, initial encounter  Elevated troponin         NEW MEDICATIONS STARTED DURING THIS VISIT:  New Prescriptions   No medications on file      Rockne Menghini, MD 05/13/17 1445

## 2017-05-13 NOTE — H&P (Signed)
[employer] [facility] - [place] [facility]   PATIENT NAME: Lisa [name]    MR#:  [id number]  DATE OF BIRTH:  06-03-1927  DATE OF ADMISSION:  05/13/2017  PRIMARY CARE PHYSICIAN: Kandyce Rud, MD   REQUESTING/REFERRING PHYSICIAN: dr. Virgilio Frees  CHIEF COMPLAINT:   Chief Complaint  Patient presents with  . Shaking  . Weakness    HISTORY OF PRESENT ILLNESS:  Lisa [name]  is a 83 y.o. female with a known history of spinal stenosis, osteoarthritis, hypertension, mild dementia, chronic systolic CHF, chronic back pain, chronic kidney disease stage III who presents to the hospital due to shaking, weakness and poor by mouth intake. Patient lives at home with private home health care. Over the past day or 2 they have noticed that the patient has not been able to use her walker much to ambulate and she falls to the ground and has been shaking quite a bit. The nurses at home and also noticed that her urine is somewhat foul-smelling. She was brought to the ER for further evaluation and noted to have a urinary tract infection. Patient was also noted to have a mildly elevated troponin but does have a history of chronic kidney disease stage III. Hospitalist services were contacted for treatment evaluation. Patient denies any dysuria, hematuria, frequency or any other associated symptoms presently.  PAST MEDICAL HISTORY:   Past Medical History:  Diagnosis Date  . Allergy   . Brain bleed (HCC)   . Chronic back pain   . Chronic systolic CHF (congestive heart failure) (HCC)   . Depression   . Foot fracture    bilateral  . Hypertension   . Osteoarthritis   . Pelvis fracture (HCC)   . Renal disorder   . Spinal stenosis   . V-tach (HCC)   . Wrist fracture, right     PAST SURGICAL HISTORY:   Past Surgical History:  Procedure Laterality Date  . BACK SURGERY N/A   . BREAST LUMPECTOMY N/A     SOCIAL HISTORY:   Social History  Substance Use Topics  . Smoking status: Never  Smoker  . Smokeless tobacco: Never Used  . Alcohol use No    FAMILY HISTORY:   Family History  Problem Relation Age of Onset  . COPD Mother   . COPD Father   . Heart disease Father     DRUG ALLERGIES:   Allergies  Allergen Reactions  . Ambien [Zolpidem Tartrate] Other (See Comments)    Reaction:  Hallucinations   . Daypro [Oxaprozin] Other (See Comments)    GI upset  . Oxycodone Nausea And Vomiting  . Shellfish Allergy Swelling and Other (See Comments)    Pts mouth and face swells.   . Ace Inhibitors Cough  . Norvasc [Amlodipine Besylate] Palpitations    REVIEW OF SYSTEMS:   Review of Systems  Constitutional: Negative for chills and fever.  HENT: Negative for congestion and tinnitus.   Eyes: Negative for blurred vision and double vision.  Respiratory: Negative for cough, shortness of breath and wheezing.   Cardiovascular: Negative for chest pain, orthopnea and PND.  Gastrointestinal: Negative for abdominal pain, diarrhea, nausea and vomiting.  Genitourinary: Negative for dysuria and hematuria.  Neurological: Positive for tremors and weakness. Negative for dizziness, sensory change and focal weakness.  All other systems reviewed and are negative.   MEDICATIONS AT HOME:   Prior to Admission medications   Medication Sig Start Date End Date Taking? Authorizing Provider  acetaminophen (TYLENOL) 325 MG tablet  Take 2 tablets (650 mg total) by mouth every 6 (six) hours as needed for mild pain. 04/28/17  Yes Kirsteins, Victorino Sparrow, MD  amiodarone (PACERONE) 100 MG tablet Take 1 tablet (100 mg total) by mouth daily. 11/03/16  Yes Iran Ouch, MD  baclofen (LIORESAL) 10 MG tablet Take 0.5 tablets (5 mg total) by mouth every 8 (eight) hours as needed for muscle spasms. 05/05/17  Yes Ranelle Oyster, MD  citalopram (CELEXA) 20 MG tablet Take 20 mg by mouth daily.  02/11/16  Yes [provider]  ELIQUIS 2.5 MG TABS tablet TAKE ONE TABLET BY MOUTH TWICE DAILY 04/13/17  Yes  Iran Ouch, MD  furosemide (LASIX) 20 MG tablet Take 1 tablet (20 mg total) by mouth daily. 11/03/16 05/13/17 Yes Iran Ouch, MD  gabapentin (NEURONTIN) 100 MG capsule Take 1 capsule (100 mg total) by mouth 3 (three) times daily. Hold for excessive sleepiness (sedation) 05/01/17  Yes Kirsteins, Victorino Sparrow, MD  irbesartan (AVAPRO) 150 MG tablet Take 150 mg by mouth daily.   Yes [provider]  levothyroxine (SYNTHROID, LEVOTHROID) 25 MCG tablet Take 25 mcg by mouth daily. 06/16/16 06/16/17 Yes [provider]  lidocaine (LIDODERM) 5 % Place 1 patch onto the skin every 12 (twelve) hours. Remove & Discard patch within 12 hours or as directed by MD 03/17/17 03/17/18 Yes Merrily Brittle, MD  metoprolol tartrate (LOPRESSOR) 25 MG tablet Take 12.5 mg by mouth 2 (two) times daily.  02/11/16  Yes [provider]  mirtazapine (REMERON) 15 MG tablet Take 15 mg by mouth at bedtime.  02/11/16  Yes [provider]  omeprazole (PRILOSEC) 20 MG capsule Take 1 capsule by mouth daily. 10/05/15  Yes [provider]  raloxifene (EVISTA) 60 MG tablet Take 1 tablet by mouth daily. 10/05/15  Yes [provider]  risperiDONE (RISPERDAL) 0.25 MG tablet TAKE ONE TABLET EVERY DAY AT BEDTIME 04/15/17  Yes Ranelle Oyster, MD  senna-docusate (SENOKOT-S) 8.6-50 MG tablet Take 2 tablets by mouth 2 (two) times daily. 09/12/15  Yes Love, Evlyn Kanner, PA-C  traMADol (ULTRAM) 50 MG tablet Take 1 tablet (50 mg total) by mouth 2 (two) times daily. 04/28/17  Yes Kirsteins, Victorino Sparrow, MD  methocarbamol (ROBAXIN) 500 MG tablet Take 1 tablet (500 mg total) by mouth every 8 (eight) hours as needed for muscle spasms. Patient not taking: Reported on 05/13/2017 05/05/17   Ranelle Oyster, MD  Polyethyl Glycol-Propyl Glycol (SYSTANE) 0.4-0.3 % SOLN Apply 1 drop to eye as needed.    [provider]  predniSONE (DELTASONE) 10 MG tablet Take 1 tablet (10 mg total) by mouth as directed.  Take 1 tablet 3 x daily for 3 days then 2 x daily for 3 days then 1 x daily for 3 days then stop Patient not taking: Reported on 05/13/2017 05/05/17   Ranelle Oyster, MD      VITAL SIGNS:  Blood pressure (!) 157/64, pulse 74, temperature 97.6 F (36.4 C), temperature source Oral, resp. rate 17, height 5\' 3"  (1.6 m), weight 77.1 kg (170 lb), SpO2 100 %.  PHYSICAL EXAMINATION:  Physical Exam  GENERAL:  83 y.o.-year-old patient lying in the bed in no acute distress.  EYES: Pupils equal, round, reactive to light and accommodation. No scleral icterus. Extraocular muscles intact.  HEENT: Head atraumatic, normocephalic. Oropharynx and nasopharynx clear. No oropharyngeal erythema, moist oral mucosa  NECK:  Supple, no jugular venous distention. No thyroid enlargement, no tenderness.  LUNGS:  Normal breath sounds bilaterally, no wheezing, rales, rhonchi. No use of accessory muscles of respiration.  CARDIOVASCULAR: S1, S2 RRR. No murmurs, rubs, gallops, clicks.  ABDOMEN: Soft, nontender, nondistended. Bowel sounds present. No organomegaly or mass.  EXTREMITIES: No pedal edema, cyanosis, or clubbing. + 2 pedal & radial pulses b/l.   NEUROLOGIC: Cranial nerves II through XII are intact. No focal Motor or sensory deficits appreciated b/l. Globally weak.  PSYCHIATRIC: The patient is alert and oriented x 3. SKIN: No obvious rash, lesion, or ulcer.   LABORATORY PANEL:   CBC  Recent Labs Lab 05/13/17 1225  WBC 12.4*  HGB 9.7*  HCT 30.6*  PLT 404   ------------------------------------------------------------------------------------------------------------------  Chemistries   Recent Labs Lab 05/13/17 1225  NA 141  K 4.3  CL 109  CO2 22  GLUCOSE 92  BUN 57*  CREATININE 2.37*  CALCIUM 8.8*   ------------------------------------------------------------------------------------------------------------------  Cardiac Enzymes  Recent Labs Lab 05/13/17 1225  TROPONINI 0.03*    ------------------------------------------------------------------------------------------------------------------  RADIOLOGY:  Dg Chest 2 View  Result Date: 05/13/2017 CLINICAL DATA:  Coughing and vomiting. EXAM: CHEST  2 VIEW COMPARISON:  12/13/2015 FINDINGS: The heart is mildly enlarged but stable. There is mild tortuosity of the thoracic aorta. The pulmonary hila appear normal. The lungs are clear of an acute process. Mild chronic bronchitic changes and chronic apical pleural and parenchymal scarring. Right-sided rib fractures are noted. Some of these appear chronic. The sixth right posterior rib fracture could be acute. There is a severe compression deformity of L1. This is new since a prior chest x-ray from 2017. IMPRESSION: No acute cardiopulmonary findings. Remote and possibly acute right rib fractures. L1 compression fracture of uncertain age but new since chest x-rays from 2017. Electronically Signed   By: Rudie Meyer M.D.   On: 05/13/2017 14:02     IMPRESSION AND PLAN:   83 year old female with past medical history ofspinal stenosis, depression, hypertension, osteoarthritis, chronic back pain, chronic systolic CHF who presents to the hospital due to weakness, falls and shaking and noted to have urinary tract infection.  1. Urinary tract infection-this is based off the urinalysis. Patient also has a foul-smelling odor to her urine. -We'll treat the patient with IV ceftriaxone, follow urine cultures.  2. Weakness, falls/tremor-secondary to her deconditioning combined with underlying urinary tract infection. - Continued to UTI with IV antibiotics as mentioned above. We'll get physical therapy consult to assess her mobility.  3. Chronic kidney disease stage III-patient's creatinine is close to baseline. Patient is to be referred to nephrology as an outpatient.  4. history of chronic atrial fibrillation-rate controlled. Continue metoprolol. Continue amiodarone. Continue  Eliquis.  5. hypothyroidism-continue Synthroid.  6. Neuropathy-continue gabapentin.  7. Depression-continue Celexa.  8. Essential hypertension- cont. Avapro, Metoprolol  9. GERD - cont. Protonix.   10. Chronic Back Pain/Spinal Stenosis - cont. Tramadol PRN, Lidoderm patch.     All the records are reviewed and case discussed with ED provider. Management plans discussed with the patient, family and they are in agreement.  CODE STATUS: DNR  TOTAL TIME TAKING CARE OF THIS PATIENT: 45 minutes.    Houston Siren M.D on 05/13/2017 at 3:36 PM  Between 7am to 6pm - Pager - [phone]  After 6pm go to [email or web address] - password [email or web address] [facility]  [facility]  Office  [phone]  CC: Primary care physician; Kandyce Rud, MD

## 2017-05-13 NOTE — ED Triage Notes (Signed)
Pt presents to ED from home via [facility] for shaking/tremors. Pt isn't shaking upon arrival but will occasionally jump. EMS reports no seizure like activity. EMS also reports, dark strong smelling urine, coughing, diarrhea x few days and 1 episode of vomit. Pt fell yesterday. Saw PCP yesterday for back problems and stopped blood thinners yesterday for upcoming procedure on back. Pt is alert and oriented. Pt c/o of back pain. Pt has a caregiver at home. Pt c/o weakness.

## 2017-05-14 ENCOUNTER — Telehealth: Payer: Self-pay

## 2017-05-14 ENCOUNTER — Observation Stay: Payer: Medicare Other

## 2017-05-14 ENCOUNTER — Telehealth: Payer: Self-pay | Admitting: Physical Medicine & Rehabilitation

## 2017-05-14 DIAGNOSIS — N390 Urinary tract infection, site not specified: Secondary | ICD-10-CM | POA: Diagnosis not present

## 2017-05-14 LAB — BASIC METABOLIC PANEL
ANION GAP: 8 (ref 5–15)
BUN: 57 mg/dL — ABNORMAL HIGH (ref 6–20)
CALCIUM: 8.4 mg/dL — AB (ref 8.9–10.3)
CO2: 21 mmol/L — ABNORMAL LOW (ref 22–32)
Chloride: 107 mmol/L (ref 101–111)
Creatinine, Ser: 2.25 mg/dL — ABNORMAL HIGH (ref 0.44–1.00)
GFR, EST AFRICAN AMERICAN: 21 mL/min — AB (ref >60)
GFR, EST NON AFRICAN AMERICAN: 18 mL/min — AB (ref >60)
Glucose, Bld: 119 mg/dL — ABNORMAL HIGH (ref 65–99)
POTASSIUM: 4.5 mmol/L (ref 3.5–5.1)
SODIUM: 136 mmol/L (ref 135–145)

## 2017-05-14 LAB — CBC
HEMATOCRIT: 29.1 % — AB (ref 35.0–47.0)
Hemoglobin: 9.3 g/dL — ABNORMAL LOW (ref 12.0–16.0)
MCH: 27.6 pg (ref 26.0–34.0)
MCHC: 31.9 g/dL — ABNORMAL LOW (ref 32.0–36.0)
MCV: 86.6 fL (ref 80.0–100.0)
Platelets: 352 10*3/uL (ref 150–440)
RBC: 3.36 MIL/uL — ABNORMAL LOW (ref 3.80–5.20)
RDW: 20 % — ABNORMAL HIGH (ref 11.5–14.5)
WBC: 11.2 10*3/uL — AB (ref 3.6–11.0)

## 2017-05-14 LAB — TROPONIN I: Troponin I: <0.03 ng/mL (ref <0.03)

## 2017-05-14 MED ORDER — HEPARIN SODIUM (PORCINE) 5000 UNIT/ML IJ SOLN
5000.0000 [IU] | Freq: Three times a day (TID) | INTRAMUSCULAR | Status: DC
Start: 2017-05-16 — End: 2017-05-15

## 2017-05-14 NOTE — Telephone Encounter (Signed)
Nate pharmacist in [facility] hospital called requesting information on when her next injection is and when to stop taking eliquis for the procedure

## 2017-05-14 NOTE — NC FL2 (Signed)
[place] MEDICAID FL2 LEVEL OF CARE SCREENING TOOL     IDENTIFICATION  Patient Name: Lisa [name] Birthdate: [birth year] Sex: female Admission Date (Current Location): 05/13/2017  [place] and [place] Number:  Chiropodist and Address:  [facility], [address], [place], [place] [address]      Provider Number: [id number]  Attending Physician Name and Address:  Adrian Saran, MD  Relative Name and Phone Number:       Current Level of Care: Hospital Recommended Level of Care: Skilled Nursing Facility Prior Approval Number:    Date Approved/Denied:   PASRR Number:  ([id number] )  Discharge Plan: SNF    Current Diagnoses: Patient Active Problem List   Diagnosis Date Noted  . Urinary tract infection 05/13/2017  . Spondylosis without myelopathy or radiculopathy, lumbar region 05/05/2017  . Right shoulder pain 09/23/2016  . New onset atrial fibrillation (HCC) 12/11/2015  . Altered mental state   . Congestive dilated cardiomyopathy (HCC)   . Dementia   . Headache 12/10/2015  . TIA (transient ischemic attack) 12/10/2015  . Influenza A 11/21/2015  . Ventricular tachycardia (HCC) 11/09/2015  . Constipation 09/12/2015  . Insomnia due to anxiety and fear 09/12/2015  . Depression, controlled 09/12/2015  . Bacterial UTI 08/27/2015  . DVT, lower extremity (HCC) 08/27/2015  . TBI (traumatic brain injury) (HCC) 08/22/2015  . ICH (intracerebral hemorrhage) (HCC)   . Abscess   . Hypomagnesemia   . SDH (subdural hematoma) (HCC)   . Boil of upper extremity   . Pneumonia due to Haemophilus influenzae (HCC)   . Chronic systolic CHF (congestive heart failure) (HCC)   . Acute on chronic renal failure (HCC)   . Hypokalemia   . Bright red blood per rectum   . Dysphagia   . Pressure ulcer 08/16/2015  . Acute respiratory failure with hypoxemia (HCC)   . Hypernatremia   . Subdural hematoma (HCC) 08/03/2015  . Lumbar radiculopathy 06/06/2015  .  Spinal stenosis, lumbar region, with neurogenic claudication 04/02/2015  . Degenerative lumbar disc 02/15/2015  . Lumbar post-laminectomy syndrome 02/15/2015  . Facet syndrome, lumbar (HCC) 02/15/2015  . Sacroiliac joint dysfunction 02/15/2015    Orientation RESPIRATION BLADDER Height & Weight     Self, Situation, Place  Normal Incontinent Weight: 170 lb 10.2 oz (77.4 kg) Height:  5\' 7"  (170.2 cm)  BEHAVIORAL SYMPTOMS/MOOD NEUROLOGICAL BOWEL NUTRITION STATUS   (none)  (none) Continent Diet (Diet: Heart Healthy )  AMBULATORY STATUS COMMUNICATION OF NEEDS Skin   Extensive Assist Verbally Normal                       Personal Care Assistance Level of Assistance  Bathing, Feeding, Dressing Bathing Assistance: Limited assistance Feeding assistance: Independent Dressing Assistance: Limited assistance     Functional Limitations Info  Sight, Hearing, Speech Sight Info: Adequate Hearing Info: Adequate Speech Info: Adequate    SPECIAL CARE FACTORS FREQUENCY  PT (By licensed PT), OT (By licensed OT)     PT Frequency:  (5) OT Frequency:  (5)            Contractures      Additional Factors Info  Code Status, Allergies Code Status Info:  (DNR ) Allergies Info:  (Ambien Zolpidem Tartrate, Daypro Oxaprozin, Oxycodone, Shellfish Allergy, Ace Inhibitors, Norvasc Amlodipine Besylate)           Current Medications (05/14/2017):  This is the current hospital active medication list Current Facility-Administered Medications  Medication  Dose Route Frequency Provider Last Rate Last Dose  . acetaminophen (TYLENOL) tablet 650 mg  650 mg Oral Q6H PRN Houston Siren, MD       Or  . acetaminophen (TYLENOL) suppository 650 mg  650 mg Rectal Q6H PRN Houston Siren, MD      . amiodarone (PACERONE) tablet 100 mg  100 mg Oral Daily Houston Siren, MD   100 mg at 05/14/17 1102  . apixaban (ELIQUIS) tablet 2.5 mg  2.5 mg Oral BID Adrian Saran, MD   2.5 mg at 05/14/17 1101  . aspirin  chewable tablet 324 mg  324 mg Oral Once Rockne Menghini, MD      . baclofen (LIORESAL) tablet 5 mg  5 mg Oral Q8H PRN Houston Siren, MD      . cefTRIAXone (ROCEPHIN) 1 g in dextrose 5 % 50 mL IVPB  1 g Intravenous Q24H Houston Siren, MD   Stopped at 05/14/17 1130  . citalopram (CELEXA) tablet 20 mg  20 mg Oral Daily Houston Siren, MD   20 mg at 05/14/17 1101  . furosemide (LASIX) tablet 20 mg  20 mg Oral Daily Houston Siren, MD   20 mg at 05/14/17 1101  . gabapentin (NEURONTIN) capsule 100 mg  100 mg Oral TID Houston Siren, MD   100 mg at 05/14/17 1546  . [START ON 05/16/2017] heparin injection 5,000 Units  5,000 Units Subcutaneous Q8H Mody, Sital, MD      . irbesartan (AVAPRO) tablet 150 mg  150 mg Oral Daily Houston Siren, MD   150 mg at 05/14/17 1100  . levothyroxine (SYNTHROID, LEVOTHROID) tablet 25 mcg  25 mcg Oral Daily Houston Siren, MD   25 mcg at 05/14/17 1101  . lidocaine (LIDODERM) 5 % 1 patch  1 patch Transdermal Q12H PRN Houston Siren, MD      . metoprolol tartrate (LOPRESSOR) tablet 12.5 mg  12.5 mg Oral BID Houston Siren, MD   12.5 mg at 05/14/17 1101  . mirtazapine (REMERON) tablet 15 mg  15 mg Oral QHS Houston Siren, MD   15 mg at 05/13/17 2104  . ondansetron (ZOFRAN) tablet 4 mg  4 mg Oral Q6H PRN Houston Siren, MD       Or  . ondansetron (ZOFRAN) injection 4 mg  4 mg Intravenous Q6H PRN Houston Siren, MD      . pantoprazole (PROTONIX) EC tablet 40 mg  40 mg Oral Daily Houston Siren, MD   40 mg at 05/14/17 1101  . raloxifene (EVISTA) tablet 60 mg  60 mg Oral Daily Houston Siren, MD   60 mg at 05/14/17 1100  . risperiDONE (RISPERDAL) tablet 0.25 mg  0.25 mg Oral QHS Houston Siren, MD   0.25 mg at 05/13/17 2105  . senna-docusate (Senokot-S) tablet 2 tablet  2 tablet Oral BID Houston Siren, MD   2 tablet at 05/13/17 2105  . traMADol (ULTRAM) tablet 50 mg  50 mg Oral BID Houston Siren, MD   50 mg at 05/14/17 1101      Discharge Medications: Please see discharge summary for a list of discharge medications.  Relevant Imaging Results:  Relevant Lab Results:   Additional Information  (SSN: [id number])  Jaxsun Ciampi, [name], LCSW

## 2017-05-14 NOTE — Evaluation (Signed)
Physical Therapy Evaluation Patient Details Name: Lisa [name] MRN: [id number] DOB: 1927-08-20 Today's Date: 05/14/2017   History of Present Illness  presented to ER secondary to LE shaking, progressive weakness and increased falls, caregiver reporting decresed PO and foul-smelling urine; admitted for management of UTI.  Clinical Impression  Upon evaluation, patient alert and oriented to basic information; follows simple commands and demonstrates fair insight/safety awareness.  Mild R-sided weakness evident with all functional activities, requiring min/mod assist +1-2 for correction of R lateral lean/LOB with all sitting/standing activities.  Unsafe to attempt any mobility without direct, hands-on assist (of 2) at this time. Currently requiring min/mod assist for bed mobility; mod assist+2 for sit/stand, basic transfers and very short-distance gait (5') with RW.  Short, choppy steps; marked difficulty negotiating RW; poor balance. Unsafe to attempt additional gait distance at this time. Of note, patient reporting pain/soreness in L forefoot/toes with movement/WBing; noted moderate brusing over forefoot/MTP joints.  RN informed/aware. Would benefit from skilled PT to address above deficits and promote optimal return to PLOF; recommend transition to STR upon discharge from acute hospitalization.     Follow Up Recommendations SNF    Equipment Recommendations       Recommendations for Other Services       Precautions / Restrictions Precautions Precautions: Fall Precaution Comments: enteric iso Restrictions Weight Bearing Restrictions: No      Mobility  Bed Mobility Overal bed mobility: Needs Assistance Bed Mobility: Supine to Sit     Supine to sit: Min assist        Transfers Overall transfer level: Needs assistance Equipment used: Rolling walker (2 wheeled) Transfers: Sit to/from Stand Sit to Stand: Min assist;Mod assist;+2 physical assistance         General transfer  comment: assist for lift off and standing balance (R lateral lean, worsens with fatigue)  Ambulation/Gait Ambulation/Gait assistance: Mod assist;+2 physical assistance Ambulation Distance (Feet): 5 Feet Assistive device: Rolling walker (2 wheeled)       General Gait Details: heavy R lateral lean, constant assist for RW management; poor balance, high fall risk.  Decreased WBing L foot due to pain in forefoot/toes (bruised after fall)  Stairs            Wheelchair Mobility    Modified Rankin (Stroke Patients Only)       Balance Overall balance assessment: Needs assistance Sitting-balance support: No upper extremity supported;Feet supported Sitting balance-Leahy Scale: Poor Sitting balance - Comments: constant R posterior/lateral lean with unsupported sitting, requiring constant cuing/assist from therapist for correction to midline Postural control: Right lateral lean Standing balance support: Bilateral upper extremity supported Standing balance-Leahy Scale: Zero                               Pertinent Vitals/Pain Pain Assessment: No/denies pain    Home Living Family/patient expects to be discharged to:: Private residence Living Arrangements: Alone Available Help at Discharge: Personal care attendant;Available 24 hours/day Type of Home: House       Home Layout: Two level;Able to live on main level with bedroom/bathroom Home Equipment: Dan Humphreys - 2 wheels;Shower seat - built in      Prior Function Level of Independence: Needs assistance         Comments: Sup with RW for ADLs and household mobility; caregiver assists with household chores, meals, meds as needed.  Does endorse multiple fall history.     Hand Dominance   Dominant Hand: Left  Extremity/Trunk Assessment   Upper Extremity Assessment Upper Extremity Assessment: Generalized weakness (grossly 3-/5 throguhout; denies sensory deficit)    Lower Extremity Assessment Lower Extremity  Assessment: Generalized weakness (grossly 3-/5 throughout, denies sensory deficit.  Mild/mod R LE jerking/buckling in WBing; high risk for buckling/LOB.)       Communication   Communication: No difficulties  Cognition Arousal/Alertness: Awake/alert Behavior During Therapy: WFL for tasks assessed/performed Overall Cognitive Status: History of cognitive impairments - at baseline                                        General Comments      Exercises Other Exercises Other Exercises: Unsupported sitting edge of bed, worked on awareness and correction of R posterior/lateral lean.  Patient voices awareness of altered midline, but demos no attempts at correction without cuing from therapist.  High risk for falls without hands-on assist at all times. Other Exercises: Toilet transfer, SPT with Rw, mod assist +2 for safety.  Poor ability to negotiate RW, poor standing balance; unsafe to complete without +2 assist.   Assessment/Plan    PT Assessment Patient needs continued PT services  PT Problem List Decreased strength;Decreased range of motion;Decreased activity tolerance;Decreased balance;Decreased mobility;Decreased coordination;Decreased cognition;Decreased knowledge of use of DME;Decreased safety awareness;Decreased knowledge of precautions;Pain       PT Treatment Interventions DME instruction;Gait training;Functional mobility training;Therapeutic activities;Therapeutic exercise;Balance training;Patient/family education    PT Goals (Current goals can be found in the Care Plan section)  Acute Rehab PT Goals Patient Stated Goal: to get stronger before I leave the hospital PT Goal Formulation: With patient Time For Goal Achievement: 05/28/17 Potential to Achieve Goals: Fair    Frequency Min 2X/week   Barriers to discharge Decreased caregiver support      Co-evaluation               AM-PAC PT "6 Clicks" Daily Activity  Outcome Measure Difficulty turning over  in bed (including adjusting bedclothes, sheets and blankets)?: Unable Difficulty moving from lying on back to sitting on the side of the bed? : Unable Difficulty sitting down on and standing up from a chair with arms (e.g., wheelchair, bedside commode, etc,.)?: Unable Help needed moving to and from a bed to chair (including a wheelchair)?: Total Help needed walking in hospital room?: Total Help needed climbing 3-5 steps with a railing? : Total 6 Click Score: 6    End of Session Equipment Utilized During Treatment: Gait belt Activity Tolerance: Patient limited by fatigue Patient left: in bed;with call bell/phone within reach;with bed alarm set;with family/visitor present Nurse Communication: Mobility status PT Visit Diagnosis: Muscle weakness (generalized) (M62.81);Difficulty in walking, not elsewhere classified (R26.2)    Time: 1610-9604 PT Time Calculation (min) (ACUTE ONLY): 32 min   Charges:   PT Evaluation $PT Eval Moderate Complexity: 1 Mod PT Treatments $Therapeutic Activity: 23-37 mins   PT G Codes:   PT G-Codes **NOT FOR INPATIENT CLASS** Functional Assessment Tool Used: AM-PAC 6 Clicks Basic Mobility Functional Limitation: Mobility: Walking and moving around Mobility: Walking and Moving Around Current Status (V4098): At least 80 percent but less than 100 percent impaired, limited or restricted Mobility: Walking and Moving Around Goal Status [phone]): At least 20 percent but less than 40 percent impaired, limited or restricted    Kajuan Guyton H. Manson Passey, PT, DPT, NCS 05/14/17, 12:03 PM [phone]

## 2017-05-14 NOTE — Telephone Encounter (Signed)
Patient's son called to let us know that she has been admitted to [facility] for weakness, UTI, trembling.  He thinks they are going to send her to rehab and would like for her to go back to [facility] to get her care, but he is not sure right now.  He wanted to let Dr. Riley Kill know what is going on.  He wants to keep her appointment with Dr. Wynn Banker next week for now, but will call back to cancel if she doesn't come home.

## 2017-05-14 NOTE — Clinical Social Work Note (Signed)
Clinical Social Work Assessment  Patient Details  Name: Lisa [name] MRN: [id number] Date of Birth: 18-Jul-1927  Date of referral:  05/14/17               Reason for consult:  Facility Placement                Permission sought to share information with:    Permission granted to share information::     Name::        Agency::     Relationship::     Contact Information:     Housing/Transportation Living arrangements for the past 2 months:  Single Family Home Source of Information:  Adult Children Patient Interpreter Needed:  None Criminal Activity/Legal Involvement Pertinent to Current Situation/Hospitalization:  No - Comment as needed Significant Relationships:  Adult Children Lives with:  Self, Other (Comment) (24/7 caregivers ) Do you feel safe going back to the place where you live?  Yes Need for family participation in patient care:  Yes (Comment)  Care giving concerns:  Patient lives in [place] with 24/7 private duty caregivers through [employer].    Social Worker assessment / plan:  Visual merchandiser (CSW) reviewed chart and noted that PT is recommending SNF and patient is under medicare observation. CSW attempted to meet with patient to discuss D/C plan however she was asleep and her caregiver at bedside form [employer] asked CSW to call her son. CSW contacted patient's son Lisa [name] [phone]. Per Lisa [name] he is patient's HPOA and lives in [place]. CSW explained that PT is recommending SNF and that medicare will not pay for it unless patient has a 3 night qualifying inpatient stay at a hospital. CSW explained that patient is under observation status. CSW explained private pay SNF options and home health options. Son reported that patient prefers to go home with home health and asked for a home health social worker in case patient changes her mind about private pay SNF in a few weeks. Son spoke with RN case manager about home health options. Plan is for patient to D/C  home. CSW will continue to follow and assist and assist as needed.   Employment status:  Disabled (Comment on whether or not currently receiving Disability) Insurance information:  Medicare PT Recommendations:  Skilled Nursing Facility Information / Referral to community resources:  Other (Comment Required) (Patient will D/C home with home health )  Patient/Family's Response to care:  Patient's son is agreeable for patient to D/C home.   Patient/Family's Understanding of and Emotional Response to Diagnosis, Current Treatment, and Prognosis:  Patient's son was very pleasant and thanked CSW for calling.   Emotional Assessment Appearance:  Appears stated age Attitude/Demeanor/Rapport:  Unable to Assess Affect (typically observed):  Unable to Assess Orientation:  Oriented to Self, Oriented to Place, Fluctuating Orientation (Suspected and/or reported Sundowners) Alcohol / Substance use:  Not Applicable Psych involvement (Current and /or in the community):  No (Comment)  Discharge Needs  Concerns to be addressed:  Discharge Planning Concerns Readmission within the last 30 days:  No Current discharge risk:  Dependent with Mobility Barriers to Discharge:  Continued Medical Work up   [employer], Lisa Crocker, LCSW 05/14/2017, 5:12 PM

## 2017-05-14 NOTE — Telephone Encounter (Addendum)
Spoke with Dr. Wynn Banker. He confirmed that Elliquis needs to be stopped 72 hours prior to injection. Contacted pharmacist at [facility] and conveyed this information

## 2017-05-14 NOTE — Progress Notes (Signed)
[facility] [place] [facility]   PATIENT NAME: Lisa [name]    MR#:  [id number]  DATE OF BIRTH:  [birth year]  SUBJECTIVE:   Patient has chronic back pain Back to baseline mental status   REVIEW OF SYSTEMS:    Review of Systems  Constitutional: Negative for fever, chills weight loss HENT: Negative for ear pain, nosebleeds, congestion, facial swelling, rhinorrhea, neck pain, neck stiffness and ear discharge.   Respiratory: Negative for cough, shortness of breath, wheezing  Cardiovascular: Negative for chest pain, palpitations and leg swelling.  Gastrointestinal: Negative for heartburn, abdominal pain, vomiting, diarrhea or consitpation Genitourinary: Negative for dysuria, urgency, frequency, hematuria Musculoskeletal: ++ chronic back pain NO joint pain Neurological: Negative for dizziness, seizures, syncope, focal weakness,  numbness and headaches.  Hematological: Does not bruise/bleed easily.  Psychiatric/Behavioral: Negative for hallucinations, confusion, dysphoric mood    Tolerating Diet: yes      DRUG ALLERGIES:   Allergies  Allergen Reactions  . Ambien [Zolpidem Tartrate] Other (See Comments)    Reaction:  Hallucinations   . Daypro [Oxaprozin] Other (See Comments)    GI upset  . Oxycodone Nausea And Vomiting  . Shellfish Allergy Swelling and Other (See Comments)    Pts mouth and face swells.   . Ace Inhibitors Cough  . Norvasc [Amlodipine Besylate] Palpitations    VITALS:  Blood pressure (!) 139/46, pulse 71, temperature 98.8 F (37.1 C), temperature source Oral, resp. rate 18, height 5\' 7"  (1.702 m), weight 77.4 kg (170 lb 10.2 oz), SpO2 97 %.  PHYSICAL EXAMINATION:   Constitutional: Appears well-developed and well-nourished. No distress. HENT: Normocephalic. [name] Oropharynx is clear and moist.  Eyes: Conjunctivae and EOM are normal. PERRLA, no scleral icterus.  Neck: Normal ROM. Neck supple. No JVD. No tracheal deviation. CVS: RRR, S1/S2  +, no murmurs, no gallops, no carotid bruit.  Pulmonary: Effort and breath sounds normal, no stridor, rhonchi, wheezes, rales.  Abdominal: Soft. BS +,  no distension, tenderness, rebound or guarding.  Musculoskeletal: Normal range of motion. No edema and no tenderness.  Neuro: Alert. CN 2-12 grossly intact. No focal deficits. Skin: Skin is warm and dry. No rash noted. Psychiatric: Normal mood and affect.      LABORATORY PANEL:   CBC  Recent Labs Lab 05/14/17 0119  WBC 11.2*  HGB 9.3*  HCT 29.1*  PLT 352   ------------------------------------------------------------------------------------------------------------------  Chemistries   Recent Labs Lab 05/14/17 0119  NA 136  K 4.5  CL 107  CO2 21*  GLUCOSE 119*  BUN 57*  CREATININE 2.25*  CALCIUM 8.4*   ------------------------------------------------------------------------------------------------------------------  Cardiac Enzymes  Recent Labs Lab 05/13/17 1736 05/13/17 2139 05/14/17 0119  TROPONINI <0.03 <0.03 <0.03   ------------------------------------------------------------------------------------------------------------------  RADIOLOGY:  Dg Chest 2 View  Result Date: 05/13/2017 CLINICAL DATA:  Coughing and vomiting. EXAM: CHEST  2 VIEW COMPARISON:  12/13/2015 FINDINGS: The heart is mildly enlarged but stable. There is mild tortuosity of the thoracic aorta. The pulmonary hila appear normal. The lungs are clear of an acute process. Mild chronic bronchitic changes and chronic apical pleural and parenchymal scarring. Right-sided rib fractures are noted. Some of these appear chronic. The sixth right posterior rib fracture could be acute. There is a severe compression deformity of L1. This is new since a prior chest x-ray from 2017. IMPRESSION: No acute cardiopulmonary findings. Remote and possibly acute right rib fractures. L1 compression fracture of uncertain age but new since chest x-rays from 2017.  Electronically Signed   By:  Rudie Meyer M.D.   On: 05/13/2017 14:02     ASSESSMENT AND PLAN:   83 year old female with history of spinal stenosis and chronic back pain with chronic systolic heart failure who presented with weakness and found to have UTI.  1. Generalized weakness and falls secondary to underlying chronic back pain and deconditioning and urinary tract infection Physical therapy is recommending skilled nursing facility  2. Urinary tract infection: Continue ceftriaxone and follow-up on final urine culture  3. Chronic kidney disease stage III: Creatinine remained stable  4. History of atrial fibrillation, chronic: Continue metoprolol and amiodarone for heart rate control Patient apparently is scheduled for translaminar injection on Monday and therefore anticoagulation will be discontinued on the 25th in anticipation for back procedure on Monday by physical medicine and rehabilitation  5. Hypothyroidism: Continue Synthroid      Management plans discussed with the patient and she is in agreement.  CODE STATUS: DNR  TOTAL TIME TAKING CARE OF THIS PATIENT: 30 minutes.     POSSIBLE D/C tomorrow, DEPENDING ON CLINICAL CONDITION.   Curties Conigliaro M.D on 05/14/2017 at 11:26 AM  Between 7am to 6pm - Pager - [phone] After 6pm go to [email or web address] - password [email or web address]  [facility]  Office  [phone]  CC: Primary care physician; Kandyce Rud, MD  Note: This dictation was prepared with Dragon dictation along with smaller phrase technology. Any transcriptional errors that result from this process are unintentional.

## 2017-05-14 NOTE — Telephone Encounter (Signed)
Patients son, Link Snuffer, called wanting [place] to know that mrs Ysaguirre is currently hospitalized and that they are wanting to conduct physical therapy with her and he would like feed back from dr Riley Kill before they do on how he feels about mrs Funches doing physical therapy at this time.  Please advise

## 2017-05-14 NOTE — Progress Notes (Signed)
Caregiver at bedside verbalized about holding the pt's blood thinner on Sunday and Monday for a "back procedure scheduled on Tuesday". This Clinical research associate educated and explained to caregiver that a doctor's order is needed to hold a blood thinner. Scheduled night medicines administered as ordered including Eliquis. Will continue to monitor.

## 2017-05-14 NOTE — Care Management Obs Status (Signed)
MEDICARE OBSERVATION STATUS NOTIFICATION   Patient Details  Name: Lisa [name] MRN: [id number] Date of Birth: 07-May-1927   Medicare Observation Status Notification Given:  Yes    Marily Memos, RN 05/14/2017, 9:13 AM

## 2017-05-14 NOTE — Progress Notes (Signed)
Patient with no abnormal abdominal assessment, cramping, or diarrhea since admission.  Dr. Juliene Pina at bedside and gave verbal order to discontinue contact isolation.

## 2017-05-14 NOTE — Progress Notes (Addendum)
8/23 10:44 Ken from [facility] called back. They are updating their protocols and recommend holding Eliquis for 72 hours prior to translaminar injection.     Tinslee Klare A. [place], [place].D., BCPS Clinical Pharmacist 05/14/2017 09:06

## 2017-05-14 NOTE — Care Management Note (Signed)
Case Management Note  Patient Details  Name: DEAIRAH ARTUSO MRN: [id number] Date of Birth: May 06, 1927  Subjective/Objective:  Spoke with son. He lives in [place]. Patient lives alone but has 24 hour caregivers. He does not want SNF at this time. Interested in home health PT and SW (for possible placement in the next few weeks). Offered choice of home health agencies and he prefers [facility]. referral to [facility] with [facility] for SW and PT. No DME needs. Prior to admission, she was able to use a walker.                Action/Plan: It is anticipated that patient will dsihcarge home tomorrow.   Expected Discharge Date:                  Expected Discharge Plan:  Home w Home Health Services  In-House Referral:     Discharge planning Services  CM Consult  Post Acute Care Choice:  Home Health Choice offered to:  Adult Children  DME Arranged:    DME Agency:     HH Arranged:  PT, Social Work [employer] Agency:  [facility]  Status of Service:  In process, will continue to follow  If discussed at Long Length of Stay Meetings, dates discussed:    Additional Comments:  Marily Memos, RN 05/14/2017, 1:59 PM

## 2017-05-15 ENCOUNTER — Observation Stay: Payer: Medicare Other

## 2017-05-15 ENCOUNTER — Encounter
Admission: RE | Admit: 2017-05-15 | Discharge: 2017-05-15 | Disposition: A | Discharge status: Home / Self Care | Payer: Medicare Other | Source: Ambulatory Visit | Attending: Internal Medicine | Admitting: Internal Medicine

## 2017-05-15 DIAGNOSIS — N390 Urinary tract infection, site not specified: Secondary | ICD-10-CM | POA: Diagnosis not present

## 2017-05-15 DIAGNOSIS — I130 Hypertensive heart and chronic kidney disease with heart failure and stage 1 through stage 4 chronic kidney disease, or unspecified chronic kidney disease: Secondary | ICD-10-CM | POA: Insufficient documentation

## 2017-05-15 MED ORDER — METHOCARBAMOL 500 MG PO TABS: 500.0000 mg | ORAL_TABLET | Freq: Three times a day (TID) | ORAL | 0 refills | Status: DC | PRN

## 2017-05-15 MED ORDER — IPRATROPIUM-ALBUTEROL 0.5-2.5 (3) MG/3ML IN SOLN: 3.0000 mL | RESPIRATORY_TRACT | Status: DC | PRN

## 2017-05-15 MED ORDER — CEPHALEXIN 500 MG PO CAPS: 500.0000 mg | ORAL_CAPSULE | Freq: Two times a day (BID) | ORAL | 0 refills | Status: DC

## 2017-05-15 MED ORDER — LIDOCAINE 5 % EX PTCH: 1.0000 | MEDICATED_PATCH | Freq: Two times a day (BID) | CUTANEOUS | 0 refills | Status: DC

## 2017-05-15 MED ORDER — BENZONATATE 100 MG PO CAPS: 100.0000 mg | ORAL_CAPSULE | Freq: Three times a day (TID) | ORAL | 0 refills | Status: DC | PRN

## 2017-05-15 MED ORDER — IPRATROPIUM-ALBUTEROL 0.5-2.5 (3) MG/3ML IN SOLN: 3.0000 mL | Freq: Four times a day (QID) | RESPIRATORY_TRACT | Status: DC | PRN

## 2017-05-15 MED ORDER — CEPHALEXIN 500 MG PO CAPS: 500.0000 mg | ORAL_CAPSULE | Freq: Two times a day (BID) | ORAL | Status: DC

## 2017-05-15 MED ORDER — FUROSEMIDE 20 MG PO TABS: 20.0000 mg | ORAL_TABLET | Freq: Once | ORAL | Status: DC

## 2017-05-15 MED ORDER — TRAMADOL HCL 50 MG PO TABS: 50.0000 mg | ORAL_TABLET | Freq: Two times a day (BID) | ORAL | 0 refills | Status: DC

## 2017-05-15 MED ORDER — BENZONATATE 100 MG PO CAPS
100.0000 mg | ORAL_CAPSULE | Freq: Three times a day (TID) | ORAL | Status: DC | PRN
  Administered 2017-05-15: 100 mg via ORAL
  Filled 2017-05-15: qty 1

## 2017-05-15 NOTE — Discharge Summary (Addendum)
[facility] [place] [facility]   PATIENT NAME: Lisa [name]    MR#:  [id number]  DATE OF BIRTH:  06-03-1927  DATE OF ADMISSION:  05/13/2017   ADMITTING PHYSICIAN: Houston Siren, MD  DATE OF DISCHARGE: 05/15/2017 PRIMARY CARE PHYSICIAN: Kandyce Rud, MD   ADMISSION DIAGNOSIS:  Elevated troponin [R74.8] Inability to walk [R26.2] Acute cystitis without hematuria [N30.00] Fall, initial encounter [W19.XXXA] DISCHARGE DIAGNOSIS:  Active Problems:   Urinary tract infection  SECONDARY DIAGNOSIS:   Past Medical History:  Diagnosis Date  . Allergy   . Brain bleed (HCC)   . Chronic back pain   . Chronic systolic CHF (congestive heart failure) (HCC)   . Depression   . Foot fracture    bilateral  . Hypertension   . Osteoarthritis   . Pelvis fracture (HCC)   . Renal disorder   . Spinal stenosis   . V-tach (HCC)   . Wrist fracture, right    HOSPITAL COURSE:   83 year old female with history of spinal stenosis and chronic back pain with chronic systolic heart failure who presented with weakness and found to have UTI.  1. Generalized weakness and falls secondary to underlying chronic back pain and deconditioning and urinary tract infection Physical therapy is recommending skilled nursing facility  2. Urinary tract infection: On ceftriaxone and follow-up on final urine culture: ESCHERICHIA COLI. Changed to keflex bid for 3 more days.  3. Chronic kidney disease stage III: Creatinine remained stable  4. History of atrial fibrillation, chronic: Continue metoprolol and amiodarone for heart rate control Patient apparently is scheduled for translaminar injection on Monday and therefore Eliquis will be discontinued on the 25th in anticipation for back procedure on Monday by physical medicine and rehabilitation.  5. Hypothyroidism: Continue Synthroid  DISCHARGE CONDITIONS:  Stable, discharge to SNF today. CONSULTS OBTAINED:   DRUG ALLERGIES:    Allergies  Allergen Reactions  . Ambien [Zolpidem Tartrate] Other (See Comments)    Reaction:  Hallucinations   . Daypro [Oxaprozin] Other (See Comments)    GI upset  . Oxycodone Nausea And Vomiting  . Shellfish Allergy Swelling and Other (See Comments)    Pts mouth and face swells.   . Ace Inhibitors Cough  . Norvasc [Amlodipine Besylate] Palpitations   DISCHARGE MEDICATIONS:   Allergies as of 05/15/2017      Reactions   Ambien [zolpidem Tartrate] Other (See Comments)   Reaction:  Hallucinations    Daypro [oxaprozin] Other (See Comments)   GI upset   Oxycodone Nausea And Vomiting   Shellfish Allergy Swelling, Other (See Comments)   Pts mouth and face swells.    Ace Inhibitors Cough   Norvasc [amlodipine Besylate] Palpitations      Medication List    STOP taking these medications   predniSONE 10 MG tablet Commonly known as:  DELTASONE     TAKE these medications   acetaminophen 325 MG tablet Commonly known as:  TYLENOL Take 2 tablets (650 mg total) by mouth every 6 (six) hours as needed for mild pain.   amiodarone 100 MG tablet Commonly known as:  PACERONE Take 1 tablet (100 mg total) by mouth daily.   baclofen 10 MG tablet Commonly known as:  LIORESAL Take 0.5 tablets (5 mg total) by mouth every 8 (eight) hours as needed for muscle spasms.   benzonatate 100 MG capsule Commonly known as:  TESSALON Take 1 capsule (100 mg total) by mouth 3 (three) times daily as needed for cough.  cephALEXin 500 MG capsule Commonly known as:  KEFLEX Take 1 capsule (500 mg total) by mouth every 12 (twelve) hours.   citalopram 20 MG tablet Commonly known as:  CELEXA Take 20 mg by mouth daily.   ELIQUIS 2.5 MG Tabs tablet Generic drug:  apixaban TAKE ONE TABLET BY MOUTH TWICE DAILY   furosemide 20 MG tablet Commonly known as:  LASIX Take 1 tablet (20 mg total) by mouth daily.   gabapentin 100 MG capsule Commonly known as:  NEURONTIN Take 1 capsule (100 mg total) by  mouth 3 (three) times daily. Hold for excessive sleepiness (sedation)   ipratropium-albuterol 0.5-2.5 (3) MG/3ML Soln Commonly known as:  DUONEB Take 3 mLs by nebulization every 6 (six) hours as needed.   irbesartan 150 MG tablet Commonly known as:  AVAPRO Take 150 mg by mouth daily.   levothyroxine 25 MCG tablet Commonly known as:  SYNTHROID, LEVOTHROID Take 25 mcg by mouth daily.   lidocaine 5 % Commonly known as:  LIDODERM Place 1 patch onto the skin every 12 (twelve) hours. Remove & Discard patch within 12 hours or as directed by MD   methocarbamol 500 MG tablet Commonly known as:  ROBAXIN Take 1 tablet (500 mg total) by mouth every 8 (eight) hours as needed for muscle spasms.   metoprolol tartrate 25 MG tablet Commonly known as:  LOPRESSOR Take 12.5 mg by mouth 2 (two) times daily.   mirtazapine 15 MG tablet Commonly known as:  REMERON Take 15 mg by mouth at bedtime.   omeprazole 20 MG capsule Commonly known as:  PRILOSEC Take 1 capsule by mouth daily.   raloxifene 60 MG tablet Commonly known as:  EVISTA Take 1 tablet by mouth daily.   risperiDONE 0.25 MG tablet Commonly known as:  RISPERDAL TAKE ONE TABLET EVERY DAY AT BEDTIME   senna-docusate 8.6-50 MG tablet Commonly known as:  Senokot-S Take 2 tablets by mouth 2 (two) times daily.   SYSTANE 0.4-0.3 % Soln Generic drug:  Polyethyl Glycol-Propyl Glycol Apply 1 drop to eye as needed.   traMADol 50 MG tablet Commonly known as:  ULTRAM Take 1 tablet (50 mg total) by mouth 2 (two) times daily.            Discharge Care Instructions        Start     Ordered   05/15/17 0000  Increase activity slowly     05/15/17 0859   05/15/17 0000  Diet - low sodium heart healthy     05/15/17 0859   05/15/17 0000  traMADol (ULTRAM) 50 MG tablet  2 times daily     05/15/17 0904   05/15/17 0000  methocarbamol (ROBAXIN) 500 MG tablet  Every 8 hours PRN     05/15/17 0904   05/15/17 0000  lidocaine (LIDODERM) 5 %   Every 12 hours     05/15/17 0904   05/15/17 0000  benzonatate (TESSALON) 100 MG capsule  3 times daily PRN     05/15/17 0935   05/15/17 0000  cephALEXin (KEFLEX) 500 MG capsule  Every 12 hours     05/15/17 0935   05/15/17 0000  ipratropium-albuterol (DUONEB) 0.5-2.5 (3) MG/3ML SOLN  Every 6 hours PRN     05/15/17 1309       DISCHARGE INSTRUCTIONS:  See AVS.  If you experience worsening of your admission symptoms, develop shortness of breath, life threatening emergency, suicidal or homicidal thoughts you must seek medical attention immediately by calling 911 or calling  your MD immediately  if symptoms less severe.  You Must read complete instructions/literature along with all the possible adverse reactions/side effects for all the Medicines you take and that have been prescribed to you. Take any new Medicines after you have completely understood and accpet all the possible adverse reactions/side effects.   Please note  You were cared for by a hospitalist during your hospital stay. If you have any questions about your discharge medications or the care you received while you were in the hospital after you are discharged, you can call the unit and asked to speak with the hospitalist on call if the hospitalist that took care of you is not available. Once you are discharged, your primary care physician will handle any further medical issues. Please note that NO REFILLS for any discharge medications will be authorized once you are discharged, as it is imperative that you return to your primary care physician (or establish a relationship with a primary care physician if you do not have one) for your aftercare needs so that they can reassess your need for medications and monitor your lab values.    On the day of Discharge:  VITAL SIGNS:  Blood pressure (!) 122/50, pulse 73, temperature 98 F (36.7 C), temperature source Oral, resp. rate 14, height 5\' 7"  (1.702 m), weight 170 lb 10.2 oz (77.4 kg),  SpO2 96 %. PHYSICAL EXAMINATION:  GENERAL:  83 y.o.-year-old patient lying in the bed with no acute distress.  EYES: Pupils equal, round, reactive to light and accommodation. No scleral icterus. Extraocular muscles intact.  HEENT: Head atraumatic, normocephalic. Oropharynx and nasopharynx clear.  NECK:  Supple, no jugular venous distention. No thyroid enlargement, no tenderness.  LUNGS: Normal breath sounds bilaterally, no wheezing, rales,rhonchi or crepitation. No use of accessory muscles of respiration.  CARDIOVASCULAR: S1, S2 normal. No murmurs, rubs, or gallops.  ABDOMEN: Soft, non-tender, non-distended. Bowel sounds present. No organomegaly or mass.  EXTREMITIES: No pedal edema, cyanosis, or clubbing.  NEUROLOGIC: Cranial nerves II through XII are intact. Muscle strength 3-4/5 in all extremities. Sensation intact. Gait not checked.  PSYCHIATRIC: The patient is alert and oriented x 3.  SKIN: No obvious rash, lesion, or ulcer.  DATA REVIEW:   CBC  Recent Labs Lab 05/14/17 0119  WBC 11.2*  HGB 9.3*  HCT 29.1*  PLT 352    Chemistries   Recent Labs Lab 05/14/17 0119  NA 136  K 4.5  CL 107  CO2 21*  GLUCOSE 119*  BUN 57*  CREATININE 2.25*  CALCIUM 8.4*     Microbiology Results  Results for orders placed or performed during the hospital encounter of 05/13/17  Urine culture     Status: Abnormal (Preliminary result)   Collection Time: 05/13/17 12:22 PM  Result Value Ref Range Status   Specimen Description URINE, RANDOM  Final   Special Requests NONE  Final   Culture >=100,000 COLONIES/mL GRAM NEGATIVE RODS (A)  Final   Report Status PENDING  Incomplete    RADIOLOGY:  Dg Chest 1 View  Result Date: 05/14/2017 CLINICAL DATA:  Worsening productive cough EXAM: CHEST 1 VIEW COMPARISON:  05/13/2017 FINDINGS: The heart size and mediastinal contours are within normal limits. Both lungs are clear. The visualized skeletal structures are unremarkable. IMPRESSION: No active  disease. Electronically Signed   By: Elige Ko   On: 05/14/2017 12:16   Dg Foot Complete Left  Result Date: 05/15/2017 CLINICAL DATA:  Second through fifth toe pain. EXAM: LEFT FOOT - COMPLETE  3+ VIEW COMPARISON:  August 06, 2014. FINDINGS: There is no evidence of fracture or dislocation. Healed fracture of the base of the fifth metatarsal. Mild midfoot degenerative changes. Small plantar and Achilles enthesophytes, unchanged. Osteopenia. Vascular calcifications. Soft tissues are unremarkable. IMPRESSION: 1.  No acute osseous abnormality. Electronically Signed   By: Obie Dredge M.D.   On: 05/15/2017 09:34     Management plans discussed with the patient, her son and they are in agreement.  CODE STATUS: DNR   TOTAL TIME TAKING CARE OF THIS PATIENT: 35 minutes.    Shaune Pollack M.D on 05/15/2017 at 9:48 AM  Between 7am to 6pm - Pager - [phone]  After 6pm go to [email or web address] - Social research officer, government  [facility]  Office  [phone]  CC: Primary care physician; Kandyce Rud, MD   Note: This dictation was prepared with Dragon dictation along with smaller phrase technology. Any transcriptional errors that result from this process are unintentional.

## 2017-05-15 NOTE — Clinical Social Work Placement (Signed)
   CLINICAL SOCIAL WORK PLACEMENT  NOTE  Date:  05/15/2017  Patient Details  Name: Lisa [name] MRN: [id number] Date of Birth: 1927-08-14  Clinical Social Work is seeking post-discharge placement for this patient at the Skilled  Nursing Facility level of care (*CSW will initial, date and re-position this form in  chart as items are completed):  Yes   Patient/family provided with [facility] Clinical Social Work Department's list of facilities offering this level of care within the geographic area requested by the patient (or if unable, by the patient's family).  Yes   Patient/family informed of their freedom to choose among providers that offer the needed level of care, that participate in Medicare, Medicaid or managed care program needed by the patient, have an available bed and are willing to accept the patient.  Yes   Patient/family informed of [facility]'s ownership interest in [facility] and [facility], as well as of the fact that they are under no obligation to receive care at these facilities.  PASRR submitted to EDS on       PASRR number received on       Existing PASRR number confirmed on 05/14/17     FL2 transmitted to all facilities in geographic area requested by pt/family on 05/15/17     FL2 transmitted to all facilities within larger geographic area on       Patient informed that his/her managed care company has contracts with or will negotiate with certain facilities, including the following:        Yes   Patient/family informed of bed offers received.  Patient chooses bed at [facility]     Physician recommends and patient chooses bed at      Patient to be transferred to [facility] on 05/15/17.  Patient to be transferred to facility by [facility]     Patient family notified on 05/15/17 of transfer.  Name of family member notified:  Pt's son, Eddie      PHYSICIAN       Additional Comment:     _______________________________________________ Dede Query, LCSW 05/15/2017, 1:20 PM

## 2017-05-15 NOTE — Care Management Important Message (Signed)
Important Message  Patient Details  Name: MONESHA FRANTZ MRN: [id number] Date of Birth: May 11, 1927   Medicare Important Message Given:  N/A - LOS <3 / Initial given by admissions    Marily Memos, RN 05/15/2017, 9:31 AM

## 2017-05-15 NOTE — Progress Notes (Signed)
Attempted to call report to [facility]. Awaiting call back. Pt ready for discharge. EMS called for transport. Pt has private careprovider at bedside

## 2017-05-15 NOTE — Discharge Instructions (Signed)
Patient apparently is scheduled for translaminar injection on Monday and therefore Eliquis will be discontinued on the 25th in anticipation for back procedure on Monday. Fall precaution.

## 2017-05-15 NOTE — Care Management Note (Signed)
Case Management Note  Patient Details  Name: Lisa [name] MRN: [id number] Date of Birth: 03/03/1927  Subjective/Objective:  Discharging today                  Action/Plan: [facility] notified of discharge. Will need SN, PT and SW. Case Closed.    Expected Discharge Date:  05/15/17               Expected Discharge Plan:  Home w Home Health Services  In-House Referral:     Discharge planning Services  CM Consult  Post Acute Care Choice:  Home Health Choice offered to:  Adult Children  DME Arranged:    DME Agency:     HH Arranged:  PT, Social Work, Therapist, music Agency:  [facility]  Status of Service:  Completed, signed off  If discussed at [employer], dates discussed:    Additional Comments:  Marily Memos, RN 05/15/2017, 9:28 AM

## 2017-05-15 NOTE — Care Management (Signed)
Family has decided they want patient to go to SNF. CSW and [facility] updated.

## 2017-05-15 NOTE — Telephone Encounter (Signed)
Called patients son back and informed him that all he would have to do is talk to her attending physician and express to him/her what your family's wishes are and they have to place in the referral and work the insurance on there end

## 2017-05-15 NOTE — Clinical Social Work Note (Signed)
Pt is ready for discharge today and will go to [facility]. Per pt's son, pt has changed her mind about [facility] and has the ability to pay privately. Pt's preference is [facility]. CSW was able to secure a bed for today. Pt's son and Jimmey Ralph worked out payment. Facility is ready to accept pt as they have received discharge information. RN has called report. Pt's son is aware, and CSW left a message confirming discharge plan and requesting a return phone call. Pt is also aware and agreeable to discharge plan. [facility] will provide transportation. CSW is signing off as no further needs identified.   Dede Query, MSW, LCSW  Clinical Social Worker  [phone]

## 2017-05-15 NOTE — Telephone Encounter (Signed)
Tell him I hope she's feeling better. I can have one of our admission coordinators check into her situation at [facility]. If we think she might be a candidate for CIR, then she would need insurance auth also.

## 2017-05-15 NOTE — Progress Notes (Signed)
Inpatient Rehabilitation  Received request from Dr. Riley Kill to review patient's medical record as she is a patient of his in his outpatient practice.  Note that patient is under observation status with admission for a UTI and therapy is recommending SNF for placement; we are in agreement with current plan of care being the most appropriate plan.  Also note that patient is pending placement today.  Dr. Riley Kill was updated and will continue to see her in clinic.    Charlane Ferretti., CCC/SLP Admission Coordinator  [facility] Inpatient Rehabilitation  Cell [phone]

## 2017-05-15 NOTE — Progress Notes (Signed)
Pts lung sounds are tight/wheezy and congested. CXR - on 8/23. Paged md for one time resp treatment.

## 2017-05-16 LAB — URINE CULTURE

## 2017-05-18 ENCOUNTER — Encounter: Payer: Self-pay | Admitting: Gerontology

## 2017-05-18 ENCOUNTER — Other Ambulatory Visit
Admission: RE | Admit: 2017-05-18 | Discharge: 2017-05-18 | Disposition: A | Discharge status: Home / Self Care | Payer: Medicare Other | Source: Ambulatory Visit | Attending: Gerontology | Admitting: Gerontology

## 2017-05-18 ENCOUNTER — Non-Acute Institutional Stay (SKILLED_NURSING_FACILITY): Payer: Medicare Other | Admitting: Gerontology

## 2017-05-18 ENCOUNTER — Telehealth: Payer: Self-pay | Admitting: *Deleted

## 2017-05-18 ENCOUNTER — Other Ambulatory Visit: Payer: Self-pay

## 2017-05-18 DIAGNOSIS — N189 Chronic kidney disease, unspecified: Secondary | ICD-10-CM

## 2017-05-18 DIAGNOSIS — N179 Acute kidney failure, unspecified: Secondary | ICD-10-CM

## 2017-05-18 DIAGNOSIS — E559 Vitamin D deficiency, unspecified: Secondary | ICD-10-CM

## 2017-05-18 DIAGNOSIS — M62838 Other muscle spasm: Secondary | ICD-10-CM | POA: Diagnosis not present

## 2017-05-18 DIAGNOSIS — I5022 Chronic systolic (congestive) heart failure: Secondary | ICD-10-CM | POA: Insufficient documentation

## 2017-05-18 DIAGNOSIS — E46 Unspecified protein-calorie malnutrition: Secondary | ICD-10-CM

## 2017-05-18 DIAGNOSIS — R062 Wheezing: Secondary | ICD-10-CM

## 2017-05-18 LAB — COMPREHENSIVE METABOLIC PANEL
ALBUMIN: 2.7 g/dL — AB (ref 3.5–5.0)
ALT: 37 U/L (ref 14–54)
AST: 48 U/L — AB (ref 15–41)
Alkaline Phosphatase: 76 U/L (ref 38–126)
Anion gap: 10 (ref 5–15)
BUN: 69 mg/dL — AB (ref 6–20)
CHLORIDE: 101 mmol/L (ref 101–111)
CO2: 22 mmol/L (ref 22–32)
Calcium: 8.8 mg/dL — ABNORMAL LOW (ref 8.9–10.3)
Creatinine, Ser: 3.35 mg/dL — ABNORMAL HIGH (ref 0.44–1.00)
GFR calc Af Amer: 13 mL/min — ABNORMAL LOW (ref >60)
GFR, EST NON AFRICAN AMERICAN: 11 mL/min — AB (ref >60)
Glucose, Bld: 118 mg/dL — ABNORMAL HIGH (ref 65–99)
POTASSIUM: 5.1 mmol/L (ref 3.5–5.1)
Sodium: 133 mmol/L — ABNORMAL LOW (ref 135–145)
Total Bilirubin: 0.5 mg/dL (ref 0.3–1.2)
Total Protein: 6.8 g/dL (ref 6.5–8.1)

## 2017-05-18 LAB — URINALYSIS, COMPLETE (UACMP) WITH MICROSCOPIC
BILIRUBIN URINE: NEGATIVE
Bacteria, UA: NONE SEEN
Glucose, UA: NEGATIVE mg/dL
Hgb urine dipstick: NEGATIVE
Ketones, ur: NEGATIVE mg/dL
LEUKOCYTES UA: NEGATIVE
NITRITE: NEGATIVE
PROTEIN: NEGATIVE mg/dL
Specific Gravity, Urine: 1.011 (ref 1.005–1.030)
pH: 5 (ref 5.0–8.0)

## 2017-05-18 LAB — CBC WITH DIFFERENTIAL/PLATELET
BASOS ABS: 0.1 10*3/uL (ref 0–0.1)
BASOS PCT: 1 %
EOS PCT: 1 %
Eosinophils Absolute: 0.1 10*3/uL (ref 0–0.7)
HCT: 26.2 % — ABNORMAL LOW (ref 35.0–47.0)
Hemoglobin: 8.4 g/dL — ABNORMAL LOW (ref 12.0–16.0)
Lymphocytes Relative: 7 %
Lymphs Abs: 0.9 10*3/uL — ABNORMAL LOW (ref 1.0–3.6)
MCH: 27.7 pg (ref 26.0–34.0)
MCHC: 32.2 g/dL (ref 32.0–36.0)
MCV: 86.1 fL (ref 80.0–100.0)
MONO ABS: 1.1 10*3/uL — AB (ref 0.2–0.9)
Monocytes Relative: 8 %
Neutro Abs: 10.8 10*3/uL — ABNORMAL HIGH (ref 1.4–6.5)
Neutrophils Relative %: 83 %
PLATELETS: 310 10*3/uL (ref 150–440)
RBC: 3.04 MIL/uL — ABNORMAL LOW (ref 3.80–5.20)
RDW: 20.1 % — AB (ref 11.5–14.5)
WBC: 13 10*3/uL — ABNORMAL HIGH (ref 3.6–11.0)

## 2017-05-18 LAB — VITAMIN B12: VITAMIN B 12: 430 pg/mL (ref 180–914)

## 2017-05-18 LAB — TSH: TSH: 3.669 u[IU]/mL (ref 0.350–4.500)

## 2017-05-18 MED ORDER — TRAMADOL HCL 50 MG PO TABS
50.0000 mg | ORAL_TABLET | Freq: Two times a day (BID) | ORAL | 1 refills | Status: DC
Start: 2017-05-18 — End: 2017-06-16

## 2017-05-18 NOTE — Telephone Encounter (Signed)
Mr Davoli called and is concerned about his mother's rehab at [facility] in [place]. (she is there now) He does not want her going through rehab with them not having any real idea what is going on with his mother's situation.  He is wanting her under the care of Dr Riley Kill and his staff at [facility] and he is not sure about how to do that. He wants it done before they do any damage at [facility] by giving her the wrong PT. She is scheduled for her procedure Tuesday with [facility] but they have not yet taken her off her blood thinner so this cannot be done.  He really wants to get her to [facility] to have her procedure and have PT and then to discuss her surgery. He would like to speak with Dr Riley Kill. Please call. Mr Banessa Crincoli Ph # is [phone]

## 2017-05-18 NOTE — Telephone Encounter (Signed)
I spoke to son. She is coming in for injection tomorrow. She has been off eliquis since Saturday. She will stay at [facility].

## 2017-05-18 NOTE — Telephone Encounter (Signed)
Rx sent to [facility] phone : [phone] , fax : [phone]  

## 2017-05-18 NOTE — Progress Notes (Addendum)
Location:    Nursing Home Room Number: 202B Place of Service:  SNF (31) Provider:  Toni Arthurs, NP-C  Derinda Late, MD  Patient Care Team: Derinda Late, MD as PCP - General (Family Medicine)  Extended Emergency Contact Information Primary Emergency Contact: Renato Battles [place] of [place] Mobile Phone: [phone] Relation: Son Secondary Emergency Contact: Betsey Holiday [place] Phone: [phone] Relation: Friend  Code Status:  DNR Goals of care: Advanced Directive information Advanced Directives 05/18/2017  Does Patient Have a Medical Advance Directive? Yes  Type of Advance Directive Out of facility DNR (pink MOST or yellow form)  Does patient want to make changes to medical advance directive? No - Patient declined  Copy of [place] in Chart? -     Chief Complaint  Patient presents with  . Acute Visit    Follow up on cough    HPI:  Pt is a 83 y.o. female seen today for an acute visit for cough. Son called very concerned that the pt has developed a cough and malaise. She is scheduled tomorrow for a spinal injection as an outpatient. Son wants her evaluated immediately and does not want her to participate in therapy until he speaks with the MD. On assessment, pt is A&O x 3 with intermittent detail confusion. Pt reports she is feeling fairly well. Occasional non-productive cough. She endorses pain in the back, decreasing her mobility. She reports neck stiffness and ache. Pt denies n/v/d/f/c/cp/sob/ha/abd pain/dizziness. Pt reports her appetite is fair, has not been eating well. Voiding ok, having regular BMs. Will obtain labs to assess hydration and nutritional status. VSS. No other complaints.    Past Medical History:  Diagnosis Date  . Allergy   . Allergy to shellfish    causes swelling  . Brain bleed ([place])   . Chronic back pain   . Chronic systolic CHF (congestive heart failure) ([place])   . Depression    unspecified  . Foot fracture    bilateral  . History of abnormal mammogram    Status post breast biopsy x 2, which were negative.   [name] History of chicken pox   . History of sinus tachycardia 11/1997   With dyspnea on exertion. Workup included a VQ scan which was normal, and ultrasound of her lower extremities, which was normal with no evidence of DVT.  [name] Hypertension   . Nocturnal leg cramps   . Osteoarthritis   . Osteoporosis   . Pelvis fracture ([place])   . Peptic ulcer disease   . Renal disorder   . Spinal stenosis   . V-tach ([place])   . Wrist fracture, right    Past Surgical History:  Procedure Laterality Date  . BACK SURGERY N/A   . BREAST LUMPECTOMY N/A   . FRACTURE SURGERY  08/25/2013   ORIF left distal radius fracture.  . WISDOM TOOTH EXTRACTION      Allergies  Allergen Reactions  . Ambien [Zolpidem Tartrate] Other (See Comments)    Reaction:  Hallucinations   . Daypro [Oxaprozin] Other (See Comments)    GI upset  . Oxycodone Nausea And Vomiting  . Shellfish Allergy Swelling and Other (See Comments)    Pts mouth and face swells.   . Ace Inhibitors Cough  . Norvasc [Amlodipine Besylate] Palpitations    Allergies as of 05/18/2017      Reactions   Ambien [zolpidem Tartrate] Other (See Comments)   Reaction:  Hallucinations    Daypro [oxaprozin] Other (See  Comments)   GI upset   Oxycodone Nausea And Vomiting   Shellfish Allergy Swelling, Other (See Comments)   Pts mouth and face swells.    Ace Inhibitors Cough   Norvasc [amlodipine Besylate] Palpitations      Medication List       Accurate as of 05/18/17  2:31 PM. Always use your most recent med list.          acetaminophen 325 MG tablet Commonly known as:  TYLENOL Take 650 mg by mouth every 6 (six) hours as needed.   amiodarone 100 MG tablet Commonly known as:  PACERONE Take 1 tablet (100 mg total) by mouth daily.   baclofen 10 MG tablet Commonly known as:  LIORESAL Take 0.5 tablets (5 mg total) by  mouth every 8 (eight) hours as needed for muscle spasms.   benzonatate 100 MG capsule Commonly known as:  TESSALON Take 1 capsule (100 mg total) by mouth 3 (three) times daily as needed for cough.   citalopram 20 MG tablet Commonly known as:  CELEXA Take 20 mg by mouth daily.   furosemide 20 MG tablet Commonly known as:  LASIX Take 20 mg by mouth daily.   gabapentin 100 MG capsule Commonly known as:  NEURONTIN Take 1 capsule (100 mg total) by mouth 3 (three) times daily. Hold for excessive sleepiness (sedation)   irbesartan 150 MG tablet Commonly known as:  AVAPRO Take 150 mg by mouth daily.   lidocaine 5 % Commonly known as:  LIDODERM Place 1 patch onto the skin every 12 (twelve) hours. Remove & Discard patch within 12 hours or as directed by MD   methocarbamol 500 MG tablet Commonly known as:  ROBAXIN Take 1 tablet (500 mg total) by mouth every 8 (eight) hours as needed for muscle spasms.   metoprolol tartrate 25 MG tablet Commonly known as:  LOPRESSOR Take 12.5 mg by mouth 2 (two) times daily.   mirtazapine 15 MG tablet Commonly known as:  REMERON Take 15 mg by mouth at bedtime.   omeprazole 20 MG capsule Commonly known as:  PRILOSEC Take 1 capsule by mouth daily.   raloxifene 60 MG tablet Commonly known as:  EVISTA Take 1 tablet by mouth daily.   risperiDONE 0.25 MG tablet Commonly known as:  RISPERDAL TAKE ONE TABLET EVERY DAY AT BEDTIME   senna-docusate 8.6-50 MG tablet Commonly known as:  Senokot-S Take 2 tablets by mouth 2 (two) times daily.   SYSTANE 0.4-0.3 % Soln Generic drug:  Polyethyl Glycol-Propyl Glycol Place 1 drop into both eyes as needed.   traMADol 50 MG tablet Commonly known as:  ULTRAM Take 1 tablet (50 mg total) by mouth 2 (two) times daily.       Review of Systems  Constitutional: Negative for activity change, appetite change, chills, diaphoresis and fever.  HENT: Negative for congestion, sneezing, sore throat, trouble  swallowing and voice change.   Respiratory: Positive for cough and wheezing. Negative for apnea, choking, chest tightness and shortness of breath.   Cardiovascular: Negative for chest pain, palpitations and leg swelling.  Gastrointestinal: Negative for abdominal distention, abdominal pain, constipation, diarrhea and nausea.  Genitourinary: Negative for difficulty urinating, dysuria, frequency and urgency.  Musculoskeletal: Positive for arthralgias (typical arthritis), neck pain and neck stiffness. Negative for back pain, gait problem and myalgias.  Skin: Negative for color change, pallor, rash and wound.  Neurological: Negative for dizziness, tremors, syncope, speech difficulty, weakness, numbness and headaches.  Psychiatric/Behavioral: Negative for agitation and behavioral problems.  All other systems reviewed and are negative.   Immunization History  Administered Date(s) Administered  . Influenza Inj Mdck Quad Pf 06/16/2016  . Influenza,inj,Quad PF,6+ Mos 08/05/2015  . Influenza-Unspecified 07/08/2012, 07/14/2013, 07/13/2014  . PPD Test 09/12/2015, 09/26/2015  . Pneumococcal Polysaccharide-23 08/15/2013, 08/05/2015   Pertinent  Health Maintenance Due  Topic Date Due  . DEXA SCAN  11/28/1993  . PNA vac Low Risk Adult (2 of 2 - PCV13) 08/04/2016  . INFLUENZA VACCINE  04/22/2017   Fall Risk  05/05/2017 04/20/2017 09/23/2016 07/23/2016 03/24/2016  Falls in the past year? _0   Comment - - - - -  Number falls in past yr: - - - - -  Injury with Fall? - - - - -  Comment - - - - -  Risk Factor Category  - - - - -  Risk for fall due to : - - - - -  Follow up - - - - -  Comment - - - - -   Functional Status Survey:    Vitals:   05/18/17 1413  BP: (!) 119/50  Pulse: 73  Resp: 18  Temp: 97.8 F (36.6 C)  SpO2: 93%  Weight: 180 lb 11.2 oz (82 kg)  Height: _1  (1.702 m)   Body mass index is 28.3 kg/m. Physical Exam  Constitutional: She is oriented to person, place, and  time. Vital signs are normal. She appears well-developed and well-nourished. She is active and cooperative. She does not appear ill. No distress.  HENT:  Head: Normocephalic and atraumatic.  Mouth/Throat: Uvula is midline, oropharynx is clear and moist and mucous membranes are normal. Mucous membranes are not pale, not dry and not cyanotic.  Eyes: Pupils are equal, round, and reactive to light. Conjunctivae, EOM and lids are normal.  Neck: Trachea normal, normal range of motion and full passive range of motion without pain. Neck supple. No JVD present. No tracheal deviation, no edema and no erythema present. No thyromegaly present.  Cardiovascular: Normal rate, regular rhythm, intact distal pulses and normal pulses.  Exam reveals no gallop, no distant heart sounds and no friction rub.   Murmur heard. Pulses:      Dorsalis pedis pulses are 2+ on the right side, and 2+ on the left side.  Pulmonary/Chest: Effort normal. No accessory muscle usage. No respiratory distress. She has no decreased breath sounds. She has wheezes in the left upper field and the left lower field. She has no rhonchi. She has no rales. She exhibits no tenderness.  Abdominal: Normal appearance and bowel sounds are normal. She exhibits no distension and no ascites. There is no tenderness.  Musculoskeletal: Normal range of motion. She exhibits no edema or tenderness.       Cervical back: She exhibits pain and spasm.  Expected osteoarthritis, stiffness  Neurological: She is alert and oriented to person, place, and time. She has normal strength.  Skin: Skin is warm, dry and intact. No rash noted. She is not diaphoretic. No cyanosis or erythema. No pallor. Nails show no clubbing.  Psychiatric: She has a normal mood and affect. Her speech is normal and behavior is normal. Judgment and thought content normal. Cognition and memory are normal.  Nursing note and vitals reviewed.   Labs reviewed:  Recent Labs  11/11/16 1127  05/13/17 1225 05/14/17 0119  NA 141 141 136  K 4.1 4.3 4.5  CL 108 109 107  CO2 24 22 21*  GLUCOSE 136* 92 119*  BUN 24* 57* 57*  CREATININE 2.21* 2.37* 2.25*  CALCIUM 9.1 8.8* 8.4*    Recent Labs  06/09/16 1326  AST 30  ALT 18  ALKPHOS 53  BILITOT <0.2  PROT 7.4  ALBUMIN 4.1    Recent Labs  11/11/16 1127 05/13/17 1225 05/14/17 0119  WBC 6.8 12.4* 11.2*  NEUTROABS 4.5  --   --   HGB 9.0* 9.7* 9.3*  HCT 27.7* 30.6* 29.1*  MCV 84.3 85.7 86.6  PLT 267 404 352   Lab Results  Component Value Date   TSH 6.350 (H) 06/09/2016   Lab Results  Component Value Date   HGBA1C 5.6 12/10/2015   Lab Results  Component Value Date   CHOL 174 12/10/2015   HDL 44 12/10/2015   LDLCALC 107 (H) 12/10/2015   TRIG 115 12/10/2015   CHOLHDL 4.0 12/10/2015    Significant Diagnostic Results in last 30 days:  Dg Chest 1 View  Result Date: 05/14/2017 CLINICAL DATA:  Worsening productive cough EXAM: CHEST 1 VIEW COMPARISON:  05/13/2017 FINDINGS: The heart size and mediastinal contours are within normal limits. Both lungs are clear. The visualized skeletal structures are unremarkable. IMPRESSION: No active disease. Electronically Signed   By: Kathreen Devoid   On: 05/14/2017 12:16   Dg Chest 2 View  Result Date: 05/13/2017 CLINICAL DATA:  Coughing and vomiting. EXAM: CHEST  2 VIEW COMPARISON:  12/13/2015 FINDINGS: The heart is mildly enlarged but stable. There is mild tortuosity of the thoracic aorta. The pulmonary hila appear normal. The lungs are clear of an acute process. Mild chronic bronchitic changes and chronic apical pleural and parenchymal scarring. Right-sided rib fractures are noted. Some of these appear chronic. The sixth right posterior rib fracture could be acute. There is a severe compression deformity of L1. This is new since a prior chest x-ray from 2017. IMPRESSION: No acute cardiopulmonary findings. Remote and possibly acute right rib fractures. L1 compression fracture of  uncertain age but new since chest x-rays from 2017. Electronically Signed   By: Marijo Sanes M.D.   On: 05/13/2017 14:02   Ct Lumbar Spine Wo Contrast  Result Date: 05/04/2017 CLINICAL DATA:  Chronic rapidly progressive low back pain radiating into both legs. EXAM: CT LUMBAR SPINE WITHOUT CONTRAST TECHNIQUE: Multidetector CT imaging of the lumbar spine was performed without intravenous contrast administration. Multiplanar CT image reconstructions were also generated. COMPARISON:  CT myelogram dated 02/08/2014 and lumbar radiographs dated 01/26/2014 FINDINGS: Segmentation: 5 lumbar type vertebrae. Alignment: Chronic grade 1 spondylolisthesis at L4-5. Vertebrae: Acute benign-appearing compression fracture of L1, 40%. Minimal protrusion of the posterior inferior aspect of L1 into the spinal canal without visible neural impingement. Small paraspinal hematoma. Paraspinal and other soft tissues: Small paraspinal hematoma around the L1 vertebral body fracture. Aortic atherosclerosis. Disc levels: T11-12: Negative. T12-L1: Normal appearing disc. Comminuted fracture of L1, benign-appearing. L1-2: Diffuse bulge of the disc. Slight protrusion of the posterior inferior aspect of L1 into the spinal canal secondary to the compression fracture. No focal neural impingement. Small paraspinal hematoma. L2-3: Chronic disc space narrowing with degenerative changes of the vertebral endplates. Previously demonstrated broad-based soft disc protrusion slightly compressing the thecal sac. L3-4: Mild broad-based soft disc protrusion with hypertrophy of the ligamentum flavum combine to create moderate spinal stenosis. This appears increased in severity since the prior CT myelogram. L4-5: 5 mm spondylolisthesis with a broad-based bulge of the uncovered disc without focal neural impingement. Severe bilateral facet arthritis. The findings are unchanged. L5-S1: Chronic marked disc  space narrowing. Small broad-based disc bulge without neural  impingement. No foraminal stenosis. IMPRESSION: 1. Acute benign-appearing compression fracture of L1 with slight protrusion of bone into the spinal canal without neural impingement. 2. Progressive moderate spinal stenosis at L3-4. 3. Stable spondylolisthesis and degenerative disc disease at L4-5. 4. Stable degenerative disc disease at L2-3 and L5-S1. Electronically Signed   By: Lorriane Shire M.D.   On: 05/04/2017 12:24   Dg Foot Complete Left  Result Date: 05/15/2017 CLINICAL DATA:  Second through fifth toe pain. EXAM: LEFT FOOT - COMPLETE 3+ VIEW COMPARISON:  August 06, 2014. FINDINGS: There is no evidence of fracture or dislocation. Healed fracture of the base of the fifth metatarsal. Mild midfoot degenerative changes. Small plantar and Achilles enthesophytes, unchanged. Osteopenia. Vascular calcifications. Soft tissues are unremarkable. IMPRESSION: 1.  No acute osseous abnormality. Electronically Signed   By: Titus Dubin M.D.   On: 05/15/2017 09:34    Assessment/Plan 1. Wheeze  CXR  Duonebs Q 6 hours prn  2. Muscle Spasm  Continue Baclofen 5 mg po Q 8 hours prn  Capsacian cream to the neck, shoulders TID  3. Protein-calorie malnutrition, unspecified severity (HCC)  Pro-Stat 30 mL po BID  Nepro 1 bottle/can po BID  4. Acute renal failure superimposed on chronic kidney disease, unspecified CKD stage, unspecified acute renal failure type (HCC)  0.9% NS - 250 mL Bolus over 1 hour, then continuous at 75 mL/ hr x 24 hours  5. Vitamin D deficiency  Cyanocobalamin 2,000 units- 2 capsules po Q Day  Family/ staff Communication:   Total Time: 45 minutes  Documentation: 10 minutes  Face to Face: 15 minutes  Family/Phone: 20 minutes- phone call with son   Labs/tests ordered: CBC, Met C, Vit D, B12, Mag+ TSH, UA, C&S, 2- V CXR  Medication list reviewed and assessed for continued appropriateness.  Vikki Ports, NP-C Geriatrics [facility] [phone], [place] [address] Cell Phone (Mon-Fri 8am-5pm):  [phone] On Call:  [phone] & follow prompts after 5pm & weekends Office Phone:  [phone] Office Fax:  [phone]

## 2017-05-19 ENCOUNTER — Ambulatory Visit: Payer: Medicare Other | Admitting: Physical Medicine & Rehabilitation

## 2017-05-19 ENCOUNTER — Ambulatory Visit (HOSPITAL_BASED_OUTPATIENT_CLINIC_OR_DEPARTMENT_OTHER): Payer: Medicare Other | Admitting: Physical Medicine & Rehabilitation

## 2017-05-19 VITALS — BP 115/57 | HR 69

## 2017-05-19 DIAGNOSIS — M47816 Spondylosis without myelopathy or radiculopathy, lumbar region: Secondary | ICD-10-CM | POA: Diagnosis not present

## 2017-05-19 NOTE — Progress Notes (Signed)
  PROCEDURE RECORD [facility]   Name: Lisa [name] DOB:March 26, 1927 MRN: [id number]  Date:05/19/2017  Physician: Claudette Laws, MD    Nurse/CMA: Bohdan Macho CMA  Allergies:  Allergies  Allergen Reactions  . Ambien [Zolpidem Tartrate] Other (See Comments)    Reaction:  Hallucinations   . Daypro [Oxaprozin] Other (See Comments)    GI upset  . Oxycodone Nausea And Vomiting  . Shellfish Allergy Swelling and Other (See Comments)    Pts mouth and face swells.   . Ace Inhibitors Cough  . Norvasc [Amlodipine Besylate] Palpitations    Consent Signed: Yes.    Is patient diabetic? No.  CBG today? NA  Pregnant: No. LMP: No LMP recorded. Patient is postmenopausal. (age 4-55)  Anticoagulants: yes (eliquis, stoped saturday) Anti-inflammatory: no Antibiotics: no  Procedure: bilateral medial branch blocks Position: Prone   Start Time: 348pm End Time: 400pm Fluoro Time: 47s  RN/CMA Shontia Gillooly CMA Emmalee Solivan CMA    Time 336pm 408pm    BP 115/57 128/54    Pulse 69 83    Respirations 16 16    O2 Sat 96% 95    S/S 6 6    Pain Level 5/10 0/10     D/C home with EMS / SON Eddie, patient A & O X 3, D/C instructions reviewed, and left via EMS gurney.

## 2017-05-19 NOTE — Patient Instructions (Addendum)
May resume Eliquis in 6 hours, or 10 PM on 05/19/2017

## 2017-05-19 NOTE — Progress Notes (Signed)
Bilateral Lumbar L3, L4  medial branch blocks and L 5 dorsal ramus injection under fluoroscopic guidance  Indication: Lumbar pain which is not relieved by medication management or other conservative care and interfering with self-care and mobility.  Informed consent was obtained after describing risks and benefits of the procedure with the patient, this includes bleeding, infection, paralysis and medication side effects.  The patient wishes to proceed and has given written consent.  The patient was placed in prone position.  The lumbar area was marked and prepped with Betadine.  One mL of 1% lidocaine was injected into each of 6 areas into the skin and subcutaneous tissue.  Then a 22-gauge 3.5in spinal needle was inserted targeting the junction of the left S1 superior articular process and sacral ala junction. Needle was advanced under fluoroscopic guidance.  Bone contact was made.  Isovue 200 was injected x 0.5 mL demonstrating no intravascular uptake.  Then a solution  of 2% MPF lidocaine was injected x 0.5 mL.  Then the left L5 superior articular process in transverse process junction was targeted.  Bone contact was made.  Isovue 200 was injected x 0.5 mL demonstrating no intravascular uptake. Then a solution containing  2% MPF lidocaine was injected x 0.5 mL.  Then the left L4 superior articular process in transverse process junction was targeted.  Bone contact was made.  Isovue 200 was injected x 0.5 mL demonstrating no intravascular uptake.  Then a solution containing2% MPF lidocaine was injected x 0.5 mL.  This same procedure was performed on the right side using the same needle, technique and injectate.  Patient tolerated procedure well.  Post procedure instructions were given.  

## 2017-05-20 ENCOUNTER — Other Ambulatory Visit
Admission: RE | Admit: 2017-05-20 | Discharge: 2017-05-20 | Disposition: A | Discharge status: Home / Self Care | Payer: Medicare Other | Source: Ambulatory Visit | Attending: Gerontology | Admitting: Gerontology

## 2017-05-20 DIAGNOSIS — I130 Hypertensive heart and chronic kidney disease with heart failure and stage 1 through stage 4 chronic kidney disease, or unspecified chronic kidney disease: Secondary | ICD-10-CM | POA: Diagnosis present

## 2017-05-20 LAB — BASIC METABOLIC PANEL
ANION GAP: 10 (ref 5–15)
BUN: 67 mg/dL — ABNORMAL HIGH (ref 6–20)
CHLORIDE: 105 mmol/L (ref 101–111)
CO2: 21 mmol/L — AB (ref 22–32)
CREATININE: 2.89 mg/dL — AB (ref 0.44–1.00)
Calcium: 8.6 mg/dL — ABNORMAL LOW (ref 8.9–10.3)
GFR calc Af Amer: 16 mL/min — ABNORMAL LOW (ref >60)
GFR calc non Af Amer: 14 mL/min — ABNORMAL LOW (ref >60)
Glucose, Bld: 87 mg/dL (ref 65–99)
Potassium: 4.6 mmol/L (ref 3.5–5.1)
Sodium: 136 mmol/L (ref 135–145)

## 2017-05-20 LAB — URINE CULTURE: Culture: NO GROWTH

## 2017-05-20 LAB — CBC WITH DIFFERENTIAL/PLATELET
BASOS ABS: 0 10*3/uL (ref 0–0.1)
Basophils Relative: 0 %
EOS ABS: 0.1 10*3/uL (ref 0–0.7)
EOS PCT: 1 %
HCT: 24.9 % — ABNORMAL LOW (ref 35.0–47.0)
Hemoglobin: 7.8 g/dL — ABNORMAL LOW (ref 12.0–16.0)
LYMPHS ABS: 0.9 10*3/uL — AB (ref 1.0–3.6)
Lymphocytes Relative: 8 %
MCH: 27.9 pg (ref 26.0–34.0)
MCHC: 31.5 g/dL — AB (ref 32.0–36.0)
MCV: 88.6 fL (ref 80.0–100.0)
MONO ABS: 0.9 10*3/uL (ref 0.2–0.9)
Monocytes Relative: 7 %
Neutro Abs: 10 10*3/uL — ABNORMAL HIGH (ref 1.4–6.5)
Neutrophils Relative %: 84 %
Platelets: 279 10*3/uL (ref 150–440)
RBC: 2.81 MIL/uL — AB (ref 3.80–5.20)
RDW: 20.8 % — ABNORMAL HIGH (ref 11.5–14.5)
WBC: 11.9 10*3/uL — AB (ref 3.6–11.0)

## 2017-05-20 LAB — VITAMIN D 25 HYDROXY (VIT D DEFICIENCY, FRACTURES): VIT D 25 HYDROXY: 15.1 ng/mL — AB (ref 30.0–100.0)

## 2017-05-21 ENCOUNTER — Other Ambulatory Visit
Admission: RE | Admit: 2017-05-21 | Discharge: 2017-05-21 | Disposition: A | Discharge status: Home / Self Care | Payer: Medicare Other | Source: Ambulatory Visit | Attending: Gerontology | Admitting: Gerontology

## 2017-05-21 DIAGNOSIS — I130 Hypertensive heart and chronic kidney disease with heart failure and stage 1 through stage 4 chronic kidney disease, or unspecified chronic kidney disease: Secondary | ICD-10-CM | POA: Insufficient documentation

## 2017-05-21 LAB — CBC WITH DIFFERENTIAL/PLATELET
BASOS ABS: 0.1 10*3/uL (ref 0–0.1)
Basophils Relative: 1 %
EOS ABS: 0.2 10*3/uL (ref 0–0.7)
EOS PCT: 1 %
HEMATOCRIT: 25 % — AB (ref 35.0–47.0)
Hemoglobin: 7.8 g/dL — ABNORMAL LOW (ref 12.0–16.0)
LYMPHS PCT: 9 %
Lymphs Abs: 1.1 10*3/uL (ref 1.0–3.6)
MCH: 27.9 pg (ref 26.0–34.0)
MCHC: 31.4 g/dL — AB (ref 32.0–36.0)
MCV: 88.8 fL (ref 80.0–100.0)
Monocytes Absolute: 0.8 10*3/uL (ref 0.2–0.9)
Monocytes Relative: 6 %
NEUTROS PCT: 83 %
Neutro Abs: 10.8 10*3/uL — ABNORMAL HIGH (ref 1.4–6.5)
PLATELETS: 264 10*3/uL (ref 150–440)
RBC: 2.81 MIL/uL — ABNORMAL LOW (ref 3.80–5.20)
RDW: 21.2 % — AB (ref 11.5–14.5)
WBC: 13 10*3/uL — AB (ref 3.6–11.0)

## 2017-05-21 LAB — BASIC METABOLIC PANEL
ANION GAP: 11 (ref 5–15)
BUN: 63 mg/dL — ABNORMAL HIGH (ref 6–20)
CALCIUM: 8.8 mg/dL — AB (ref 8.9–10.3)
CO2: 20 mmol/L — AB (ref 22–32)
CREATININE: 2.71 mg/dL — AB (ref 0.44–1.00)
Chloride: 103 mmol/L (ref 101–111)
GFR, EST AFRICAN AMERICAN: 17 mL/min — AB (ref >60)
GFR, EST NON AFRICAN AMERICAN: 15 mL/min — AB (ref >60)
Glucose, Bld: 63 mg/dL — ABNORMAL LOW (ref 65–99)
Potassium: 4.4 mmol/L (ref 3.5–5.1)
SODIUM: 134 mmol/L — AB (ref 135–145)

## 2017-05-22 DIAGNOSIS — I130 Hypertensive heart and chronic kidney disease with heart failure and stage 1 through stage 4 chronic kidney disease, or unspecified chronic kidney disease: Secondary | ICD-10-CM | POA: Diagnosis not present

## 2017-05-22 LAB — CBC WITH DIFFERENTIAL/PLATELET
BASOS ABS: 0.1 10*3/uL (ref 0–0.1)
BASOS PCT: 1 %
Eosinophils Absolute: 0.1 10*3/uL (ref 0–0.7)
Eosinophils Relative: 1 %
HEMATOCRIT: 24.5 % — AB (ref 35.0–47.0)
Hemoglobin: 7.8 g/dL — ABNORMAL LOW (ref 12.0–16.0)
Lymphocytes Relative: 13 %
Lymphs Abs: 1.4 10*3/uL (ref 1.0–3.6)
MCH: 27.6 pg (ref 26.0–34.0)
MCHC: 31.7 g/dL — ABNORMAL LOW (ref 32.0–36.0)
MCV: 87.2 fL (ref 80.0–100.0)
MONO ABS: 0.7 10*3/uL (ref 0.2–0.9)
Monocytes Relative: 7 %
NEUTROS ABS: 8.2 10*3/uL — AB (ref 1.4–6.5)
Neutrophils Relative %: 78 %
PLATELETS: 278 10*3/uL (ref 150–440)
RBC: 2.81 MIL/uL — AB (ref 3.80–5.20)
RDW: 20.4 % — AB (ref 11.5–14.5)
WBC: 10.5 10*3/uL (ref 3.6–11.0)

## 2017-05-23 ENCOUNTER — Encounter
Admission: RE | Admit: 2017-05-23 | Discharge: 2017-05-23 | Disposition: A | Discharge status: Home / Self Care | Payer: Medicare Other | Source: Ambulatory Visit | Attending: Internal Medicine | Admitting: Internal Medicine

## 2017-05-25 DIAGNOSIS — E46 Unspecified protein-calorie malnutrition: Secondary | ICD-10-CM | POA: Insufficient documentation

## 2017-05-25 DIAGNOSIS — E559 Vitamin D deficiency, unspecified: Secondary | ICD-10-CM | POA: Insufficient documentation

## 2017-05-26 ENCOUNTER — Other Ambulatory Visit
Admission: RE | Admit: 2017-05-26 | Discharge: 2017-05-26 | Disposition: A | Discharge status: Home / Self Care | Payer: Medicare Other | Source: Ambulatory Visit | Attending: Gerontology | Admitting: Gerontology

## 2017-05-26 DIAGNOSIS — I130 Hypertensive heart and chronic kidney disease with heart failure and stage 1 through stage 4 chronic kidney disease, or unspecified chronic kidney disease: Secondary | ICD-10-CM | POA: Diagnosis present

## 2017-05-26 LAB — CBC WITH DIFFERENTIAL/PLATELET
BASOS ABS: 0.1 10*3/uL (ref 0–0.1)
BASOS PCT: 1 %
EOS ABS: 0.4 10*3/uL (ref 0–0.7)
Eosinophils Relative: 5 %
HCT: 26.3 % — ABNORMAL LOW (ref 35.0–47.0)
Hemoglobin: 8.4 g/dL — ABNORMAL LOW (ref 12.0–16.0)
LYMPHS PCT: 15 %
Lymphs Abs: 1.1 10*3/uL (ref 1.0–3.6)
MCH: 27.8 pg (ref 26.0–34.0)
MCHC: 31.8 g/dL — AB (ref 32.0–36.0)
MCV: 87.6 fL (ref 80.0–100.0)
MONO ABS: 0.6 10*3/uL (ref 0.2–0.9)
Monocytes Relative: 8 %
NEUTROS ABS: 5 10*3/uL (ref 1.4–6.5)
Neutrophils Relative %: 71 %
PLATELETS: 323 10*3/uL (ref 150–440)
RBC: 3.01 MIL/uL — ABNORMAL LOW (ref 3.80–5.20)
RDW: 20.9 % — AB (ref 11.5–14.5)
Smear Review: ADEQUATE
WBC: 7.2 10*3/uL (ref 3.6–11.0)

## 2017-05-26 LAB — BASIC METABOLIC PANEL
Anion gap: 9 (ref 5–15)
BUN: 79 mg/dL — AB (ref 6–20)
CALCIUM: 8.9 mg/dL (ref 8.9–10.3)
CO2: 21 mmol/L — ABNORMAL LOW (ref 22–32)
CREATININE: 2.66 mg/dL — AB (ref 0.44–1.00)
Chloride: 106 mmol/L (ref 101–111)
GFR, EST AFRICAN AMERICAN: 17 mL/min — AB (ref >60)
GFR, EST NON AFRICAN AMERICAN: 15 mL/min — AB (ref >60)
Glucose, Bld: 92 mg/dL (ref 65–99)
Potassium: 4.4 mmol/L (ref 3.5–5.1)
SODIUM: 136 mmol/L (ref 135–145)

## 2017-05-27 ENCOUNTER — Non-Acute Institutional Stay (SKILLED_NURSING_FACILITY): Payer: Medicare Other | Admitting: Gerontology

## 2017-05-27 ENCOUNTER — Encounter: Payer: Self-pay | Admitting: Gerontology

## 2017-05-27 ENCOUNTER — Telehealth: Payer: Self-pay | Admitting: *Deleted

## 2017-05-27 DIAGNOSIS — R531 Weakness: Secondary | ICD-10-CM | POA: Diagnosis not present

## 2017-05-27 DIAGNOSIS — M62838 Other muscle spasm: Secondary | ICD-10-CM

## 2017-05-27 DIAGNOSIS — M48062 Spinal stenosis, lumbar region with neurogenic claudication: Secondary | ICD-10-CM

## 2017-05-27 NOTE — Telephone Encounter (Signed)
Patients son called asking if Dr. Riley KillSwartz could explain the reasoning behind the patients tremors in her legs.  The rehab facility is wanting to know what the cause is before they begin treatment.  Please advise or call Mendel Ryderddie Pouncey at [phone]

## 2017-05-27 NOTE — Telephone Encounter (Signed)
Called son back and relayed message to him from the doctor

## 2017-05-27 NOTE — Telephone Encounter (Signed)
The "tremblling" in her legs is most likely related to her low back and referred leg pain as well as the physical weakness of the legs which is exacerbated with attempts to transfer or use the legs for functional activities. "Trembling" or abnormal movements may also be seen with some medications like the risperdal but we would expect to see these more diffusely, not just in the legs

## 2017-05-27 NOTE — Progress Notes (Signed)
Location:   [facility] Room Number: 202B Place of Service:  SNF [phone]) Provider:  Lorenso Quarry, NP-C  Kandyce Rud, MD  Patient Care Team: Kandyce Rud, MD as PCP - General (Family Medicine)  Extended Emergency Contact Information Primary Emergency Contact: Grier Rocher [place] of [place] Mobile Phone: [phone] Relation: Son Secondary Emergency Contact: Verdene Lennert [place] of [place] Home Phone: [phone] Relation: Friend  Code Status:  DNR Goals of care: Advanced Directive information Advanced Directives 05/27/2017  Does Patient Have a Medical Advance Directive? Yes  Type of Advance Directive Out of facility DNR (pink MOST or yellow form)  Does patient want to make changes to medical advance directive? No - Patient declined  Copy of Healthcare Power of Attorney in Chart? -     Chief Complaint  Patient presents with  . Medical Management of Chronic Issues    Routine Visit    HPI:  Pt is a 83 y.o. female seen today for routine follow up, s/p admission to facility for rehab following spinal injection for pain and UTI. Pt has been participating in PT/OT. She continues to be intermittently detail confused. Short term memory deficits. Pt reports her pain is well controlled on current regimen. She denies cough or congestion. Denies n/v/d/f/c/cp/sob/ha/abd pain/dizziness. Sitter present and confirms pt is doing well. VSS. No other complaints.      Past Medical History:  Diagnosis Date  . Allergy   . Allergy to shellfish    causes swelling  . Brain bleed (HCC)   . Chronic back pain   . Chronic systolic CHF (congestive heart failure) (HCC)   . Depression    unspecified  . Foot fracture    bilateral  . History of abnormal mammogram    Status post breast biopsy x 2, which were negative.   [name] History of chicken pox   . History of sinus tachycardia 11/1997   With dyspnea on exertion. Workup included a VQ scan which was  normal, and ultrasound of her lower extremities, which was normal with no evidence of DVT.  [name] Hypertension   . Nocturnal leg cramps   . Osteoarthritis   . Osteoporosis   . Pelvis fracture (HCC)   . Peptic ulcer disease   . Renal disorder   . Spinal stenosis   . V-tach (HCC)   . Wrist fracture, right    Past Surgical History:  Procedure Laterality Date  . BACK SURGERY N/A   . BREAST LUMPECTOMY N/A   . FRACTURE SURGERY  08/25/2013   ORIF left distal radius fracture.  . WISDOM TOOTH EXTRACTION      Allergies  Allergen Reactions  . Ambien [Zolpidem Tartrate] Other (See Comments)    Reaction:  Hallucinations   . Daypro [Oxaprozin] Other (See Comments)    GI upset  . Oxycodone Nausea And Vomiting  . Shellfish Allergy Swelling and Other (See Comments)    Pts mouth and face swells.   . Ace Inhibitors Cough  . Norvasc [Amlodipine Besylate] Palpitations    Allergies as of 05/27/2017      Reactions   Ambien [zolpidem Tartrate] Other (See Comments)   Reaction:  Hallucinations    Daypro [oxaprozin] Other (See Comments)   GI upset   Oxycodone Nausea And Vomiting   Shellfish Allergy Swelling, Other (See Comments)   Pts mouth and face swells.    Ace Inhibitors Cough   Norvasc [amlodipine Besylate] Palpitations      Medication List  Accurate as of 05/27/17  9:54 AM. Always use your most recent med list.          acetaminophen 325 MG tablet Commonly known as:  TYLENOL Take 650 mg by mouth every 6 (six) hours as needed.   amiodarone 100 MG tablet Commonly known as:  PACERONE Take 1 tablet (100 mg total) by mouth daily.   B-12 1000 MCG Tabs Take 1 tablet by mouth daily.   baclofen 10 MG tablet Commonly known as:  LIORESAL Take 0.5 tablets (5 mg total) by mouth every 8 (eight) hours as needed for muscle spasms.   benzonatate 100 MG capsule Commonly known as:  TESSALON Take 1 capsule (100 mg total) by mouth 3 (three) times daily as needed for cough.   capsaicin  0.025 % cream Commonly known as:  ZOSTRIX Massage cream onto the posterior neck and shoulders topically 3 times daily for muscle cramps   citalopram 20 MG tablet Commonly known as:  CELEXA Take 20 mg by mouth daily.   DERMACLOUD Crea Apply liberal amount topically to area of skin irritation as needed. Ok to leave at bedside.   ELIQUIS 2.5 MG Tabs tablet Generic drug:  apixaban Take 2.5 mg by mouth 2 (two) times daily.   feeding supplement (NEPRO CARB STEADY) Liqd Take 1,000 mLs by mouth 2 (two) times daily between meals.   feeding supplement (PRO-STAT SUGAR FREE 64) Liqd Take 30 mLs by mouth 2 (two) times daily between meals.   ferrous sulfate 325 (65 FE) MG EC tablet Take 325 mg by mouth 2 (two) times daily.   furosemide 20 MG tablet Commonly known as:  LASIX Take 20 mg by mouth daily.   gabapentin 100 MG capsule Commonly known as:  NEURONTIN Take 1 capsule (100 mg total) by mouth 3 (three) times daily. Hold for excessive sleepiness (sedation)   ipratropium-albuterol 0.5-2.5 (3) MG/3ML Soln Commonly known as:  DUONEB Take 3 mLs by nebulization every 6 (six) hours as needed.   irbesartan 150 MG tablet Commonly known as:  AVAPRO Take 150 mg by mouth daily.   levothyroxine 25 MCG tablet Commonly known as:  SYNTHROID, LEVOTHROID Take 25 mcg by mouth daily before breakfast.   lidocaine 5 % Commonly known as:  LIDODERM Place 1 patch onto the skin every 12 (twelve) hours. Remove & Discard patch within 12 hours or as directed by MD   metoprolol tartrate 25 MG tablet Commonly known as:  LOPRESSOR Take 12.5 mg by mouth 2 (two) times daily.   mirtazapine 15 MG tablet Commonly known as:  REMERON Take 15 mg by mouth at bedtime.   omeprazole 20 MG capsule Commonly known as:  PRILOSEC Take 1 capsule by mouth daily.   raloxifene 60 MG tablet Commonly known as:  EVISTA Take 1 tablet by mouth daily.   risperiDONE 0.25 MG tablet Commonly known as:  RISPERDAL TAKE ONE  TABLET EVERY DAY AT BEDTIME   senna-docusate 8.6-50 MG tablet Commonly known as:  Senokot-S Take 2 tablets by mouth 2 (two) times daily.   SYSTANE 0.4-0.3 % Soln Generic drug:  Polyethyl Glycol-Propyl Glycol Place 1 drop into both eyes as needed.   traMADol 50 MG tablet Commonly known as:  ULTRAM Take 1 tablet (50 mg total) by mouth 2 (two) times daily.   vitamin C 500 MG tablet Commonly known as:  ASCORBIC ACID Take 500 mg by mouth 2 (two) times daily.       Review of Systems  Constitutional: Negative for activity  change, appetite change, chills, diaphoresis and fever.  HENT: Negative for congestion, sneezing, sore throat, trouble swallowing and voice change.   Respiratory: Negative for apnea, cough, choking, chest tightness, shortness of breath and wheezing.   Cardiovascular: Negative for chest pain, palpitations and leg swelling.  Gastrointestinal: Negative for abdominal distention, abdominal pain, constipation, diarrhea and nausea.  Genitourinary: Negative for difficulty urinating, dysuria, frequency and urgency.  Musculoskeletal: Positive for arthralgias (typical arthritis). Negative for back pain, gait problem, myalgias, neck pain and neck stiffness.  Skin: Negative for color change, pallor, rash and wound.  Neurological: Positive for weakness. Negative for dizziness, tremors, syncope, speech difficulty, numbness and headaches.  Psychiatric/Behavioral: Negative for agitation and behavioral problems.  All other systems reviewed and are negative.   Immunization History  Administered Date(s) Administered  . Influenza Inj Mdck Quad Pf 06/16/2016  . Influenza,inj,Quad PF,6+ Mos 08/05/2015  . Influenza-Unspecified 07/08/2012, 07/14/2013, 07/13/2014  . PPD Test 09/12/2015, 09/26/2015  . Pneumococcal Polysaccharide-23 08/15/2013, 08/05/2015   Pertinent  Health Maintenance Due  Topic Date Due  . DEXA SCAN  11/28/1993  . PNA vac Low Risk Adult (2 of 2 - PCV13) 08/04/2016  .  INFLUENZA VACCINE  04/22/2017   Fall Risk  05/05/2017 04/20/2017 09/23/2016 07/23/2016 03/24/2016  Falls in the past year? No No No No No  Comment - - - - -  Number falls in past yr: - - - - -  Injury with Fall? - - - - -  Comment - - - - -  Risk Factor Category  - - - - -  Risk for fall due to : - - - - -  Follow up - - - - -  Comment - - - - -   Functional Status Survey:    Vitals:   05/27/17 0942  BP: 126/62  Pulse: 66  Resp: 17  Temp: 97.6 F (36.4 C)  SpO2: 94%  Weight: 186 lb (84.4 kg)  Height: 5\' 7"  (1.702 m)   Body mass index is 29.13 kg/m. Physical Exam  Constitutional: She is oriented to person, place, and time. Vital signs are normal. She appears well-developed and well-nourished. She is active and cooperative. She does not appear ill. No distress.  HENT:  Head: Normocephalic and atraumatic.  Mouth/Throat: Uvula is midline, oropharynx is clear and moist and mucous membranes are normal. Mucous membranes are not pale, not dry and not cyanotic.  Eyes: Pupils are equal, round, and reactive to light. Conjunctivae, EOM and lids are normal.  Neck: Trachea normal, normal range of motion and full passive range of motion without pain. Neck supple. No JVD present. No tracheal deviation, no edema and no erythema present. No thyromegaly present.  Cardiovascular: Normal rate, regular rhythm, intact distal pulses and normal pulses.  Exam reveals no gallop, no distant heart sounds and no friction rub.   Murmur heard. Pulses:      Dorsalis pedis pulses are 2+ on the right side, and 2+ on the left side.  Pulmonary/Chest: Effort normal. No accessory muscle usage. No respiratory distress. She has no decreased breath sounds. She has no rhonchi. She has no rales. She exhibits no tenderness.  Abdominal: Normal appearance and bowel sounds are normal. She exhibits no distension and no ascites. There is no tenderness.  Musculoskeletal: Normal range of motion. She exhibits no edema or tenderness.         Cervical back: She exhibits pain and spasm.  Expected osteoarthritis, stiffness  Neurological: She is alert and oriented to  person, place, and time. She has normal strength.  Skin: Skin is warm, dry and intact. No rash noted. She is not diaphoretic. No cyanosis or erythema. No pallor. Nails show no clubbing.  Psychiatric: She has a normal mood and affect. Her speech is normal and behavior is normal. Judgment and thought content normal. Cognition and memory are normal.  Nursing note and vitals reviewed.   Labs reviewed:  Recent Labs  05/20/17 0455 05/21/17 0532 05/26/17 0632  NA 136 134* 136  K 4.6 4.4 4.4  CL 105 103 106  CO2 21* 20* 21*  GLUCOSE 87 63* 92  BUN 67* 63* 79*  CREATININE 2.89* 2.71* 2.66*  CALCIUM 8.6* 8.8* 8.9    Recent Labs  06/09/16 1326 05/18/17 1500  AST 30 48*  ALT 18 37  ALKPHOS 53 76  BILITOT <0.2 0.5  PROT 7.4 6.8  ALBUMIN 4.1 2.7*    Recent Labs  05/21/17 0532 05/22/17 0510 05/26/17 0632  WBC 13.0* 10.5 7.2  NEUTROABS 10.8* 8.2* 5.0  HGB 7.8* 7.8* 8.4*  HCT 25.0* 24.5* 26.3*  MCV 88.8 87.2 87.6  PLT 264 278 323   Lab Results  Component Value Date   TSH 3.669 05/18/2017   Lab Results  Component Value Date   HGBA1C 5.6 12/10/2015   Lab Results  Component Value Date   CHOL 174 12/10/2015   HDL 44 12/10/2015   LDLCALC 107 (H) 12/10/2015   TRIG 115 12/10/2015   CHOLHDL 4.0 12/10/2015    Significant Diagnostic Results in last 30 days:  Dg Chest 1 View  Result Date: 05/14/2017 CLINICAL DATA:  Worsening productive cough EXAM: CHEST 1 VIEW COMPARISON:  05/13/2017 FINDINGS: The heart size and mediastinal contours are within normal limits. Both lungs are clear. The visualized skeletal structures are unremarkable. IMPRESSION: No active disease. Electronically Signed   By: Elige Ko   On: 05/14/2017 12:16   Dg Chest 2 View  Result Date: 05/13/2017 CLINICAL DATA:  Coughing and vomiting. EXAM: CHEST  2 VIEW COMPARISON:   12/13/2015 FINDINGS: The heart is mildly enlarged but stable. There is mild tortuosity of the thoracic aorta. The pulmonary hila appear normal. The lungs are clear of an acute process. Mild chronic bronchitic changes and chronic apical pleural and parenchymal scarring. Right-sided rib fractures are noted. Some of these appear chronic. The sixth right posterior rib fracture could be acute. There is a severe compression deformity of L1. This is new since a prior chest x-ray from 2017. IMPRESSION: No acute cardiopulmonary findings. Remote and possibly acute right rib fractures. L1 compression fracture of uncertain age but new since chest x-rays from 2017. Electronically Signed   By: Rudie Meyer M.D.   On: 05/13/2017 14:02   Ct Lumbar Spine Wo Contrast  Result Date: 05/04/2017 CLINICAL DATA:  Chronic rapidly progressive low back pain radiating into both legs. EXAM: CT LUMBAR SPINE WITHOUT CONTRAST TECHNIQUE: Multidetector CT imaging of the lumbar spine was performed without intravenous contrast administration. Multiplanar CT image reconstructions were also generated. COMPARISON:  CT myelogram dated 02/08/2014 and lumbar radiographs dated 01/26/2014 FINDINGS: Segmentation: 5 lumbar type vertebrae. Alignment: Chronic grade 1 spondylolisthesis at L4-5. Vertebrae: Acute benign-appearing compression fracture of L1, 40%. Minimal protrusion of the posterior inferior aspect of L1 into the spinal canal without visible neural impingement. Small paraspinal hematoma. Paraspinal and other soft tissues: Small paraspinal hematoma around the L1 vertebral body fracture. Aortic atherosclerosis. Disc levels: T11-12: Negative. T12-L1: Normal appearing disc. Comminuted fracture of L1, benign-appearing. L1-2:  Diffuse bulge of the disc. Slight protrusion of the posterior inferior aspect of L1 into the spinal canal secondary to the compression fracture. No focal neural impingement. Small paraspinal hematoma. L2-3: Chronic disc space  narrowing with degenerative changes of the vertebral endplates. Previously demonstrated broad-based soft disc protrusion slightly compressing the thecal sac. L3-4: Mild broad-based soft disc protrusion with hypertrophy of the ligamentum flavum combine to create moderate spinal stenosis. This appears increased in severity since the prior CT myelogram. L4-5: 5 mm spondylolisthesis with a broad-based bulge of the uncovered disc without focal neural impingement. Severe bilateral facet arthritis. The findings are unchanged. L5-S1: Chronic marked disc space narrowing. Small broad-based disc bulge without neural impingement. No foraminal stenosis. IMPRESSION: 1. Acute benign-appearing compression fracture of L1 with slight protrusion of bone into the spinal canal without neural impingement. 2. Progressive moderate spinal stenosis at L3-4. 3. Stable spondylolisthesis and degenerative disc disease at L4-5. 4. Stable degenerative disc disease at L2-3 and L5-S1. Electronically Signed   By: Francene Boyers M.D.   On: 05/04/2017 12:24   Dg Foot Complete Left  Result Date: 05/15/2017 CLINICAL DATA:  Second through fifth toe pain. EXAM: LEFT FOOT - COMPLETE 3+ VIEW COMPARISON:  August 06, 2014. FINDINGS: There is no evidence of fracture or dislocation. Healed fracture of the base of the fifth metatarsal. Mild midfoot degenerative changes. Small plantar and Achilles enthesophytes, unchanged. Osteopenia. Vascular calcifications. Soft tissues are unremarkable. IMPRESSION: 1.  No acute osseous abnormality. Electronically Signed   By: Obie Dredge M.D.   On: 05/15/2017 09:34    Assessment/Plan 1. Generalized weakness  Continue working with PT/OT  Continue exercises as taught by PT/OT  Ambulate with assistance  2. Spinal stenosis, lumbar region, with neurogenic claudication  Improved  Continue tramadol 50 mg po BID for pain  Continue APAP 650 mg po Q 6 hours prn  Continue Lidoderm patch 5% Q Day  3. Muscle  spasm  Continue pain control as listed above  Continue Capsaicin 0.025 % cream to neck TID prn   Family/ staff Communication:   Total Time:  Documentation:  Face to Face:  Family/Phone:   Labs/tests ordered:    Medication list reviewed and assessed for continued appropriateness. Monthly medication orders reviewed and signed.  Brynda Rim, NP-C Geriatrics [facility] [phone], [place] [address] Cell Phone (Mon-Fri 8am-5pm):  [phone] On Call:  [phone] & follow prompts after 5pm & weekends Office Phone:  [phone] Office Fax:  [phone]

## 2017-05-29 ENCOUNTER — Other Ambulatory Visit: Payer: Self-pay | Admitting: Gerontology

## 2017-05-29 DIAGNOSIS — R1312 Dysphagia, oropharyngeal phase: Secondary | ICD-10-CM

## 2017-06-03 ENCOUNTER — Non-Acute Institutional Stay (SKILLED_NURSING_FACILITY): Payer: Medicare Other | Admitting: Gerontology

## 2017-06-03 ENCOUNTER — Encounter: Payer: Self-pay | Admitting: Gerontology

## 2017-06-03 DIAGNOSIS — M48062 Spinal stenosis, lumbar region with neurogenic claudication: Secondary | ICD-10-CM

## 2017-06-03 DIAGNOSIS — R531 Weakness: Secondary | ICD-10-CM | POA: Diagnosis not present

## 2017-06-03 DIAGNOSIS — M62838 Other muscle spasm: Secondary | ICD-10-CM

## 2017-06-03 NOTE — Progress Notes (Signed)
Location:   [facility] Room Number: 202B Place of Service:  SNF [phone]) Provider:  Toni Arthurs, NP-C  Derinda Late, MD  Patient Care Team: Derinda Late, MD as PCP - General (Family Medicine)  Extended Emergency Contact Information Primary Emergency Contact: Renato Battles [place] of [place] Mobile Phone: [phone] Relation: Son Secondary Emergency Contact: Betsey Holiday [place] Phone: [phone] Relation: Friend  Code Status:  DNR Goals of care: Advanced Directive information Advanced Directives 06/03/2017  Does Patient Have a Medical Advance Directive? Yes  Type of Advance Directive Out of facility DNR (pink MOST or yellow form)  Does patient want to make changes to medical advance directive? No - Patient declined  Copy of [place] in Chart? -     Chief Complaint  Patient presents with  . Medical Management of Chronic Issues    Routine Visit    HPI:  Pt is a 83 y.o. female seen today for routine follow up, s/p admission to facility for rehab following spinal injection for pain and UTI. Pt has been participating in PT/OT. She continues to be intermittently detail confused. Short term memory deficits. Pt reports her pain is well controlled on current regimen. She denies cough or congestion. Denies n/v/d/f/c/cp/sob/ha/abd pain/dizziness. She reports her appetite is fair. Voiding well. Having regular BMs. Sitter present and confirms pt is doing well. VSS. No other complaints.      Past Medical History:  Diagnosis Date  . Allergy   . Allergy to shellfish    causes swelling  . Brain bleed ([place])   . Chronic back pain   . Chronic systolic CHF (congestive heart failure) ([place])   . Depression    unspecified  . Foot fracture    bilateral  . History of abnormal mammogram    Status post breast biopsy x 2, which were negative.   [name] History of chicken pox   . History of sinus tachycardia 11/1997     With dyspnea on exertion. Workup included a VQ scan which was normal, and ultrasound of her lower extremities, which was normal with no evidence of DVT.  [name] Hypertension   . Nocturnal leg cramps   . Osteoarthritis   . Osteoporosis   . Pelvis fracture ([place])   . Peptic ulcer disease   . Renal disorder   . Spinal stenosis   . V-tach ([place])   . Wrist fracture, right    Past Surgical History:  Procedure Laterality Date  . BACK SURGERY N/A   . BREAST LUMPECTOMY N/A   . FRACTURE SURGERY  08/25/2013   ORIF left distal radius fracture.  . WISDOM TOOTH EXTRACTION      Allergies  Allergen Reactions  . Ambien [Zolpidem Tartrate] Other (See Comments)    Reaction:  Hallucinations   . Daypro [Oxaprozin] Other (See Comments)    GI upset  . Oxycodone Nausea And Vomiting  . Shellfish Allergy Swelling and Other (See Comments)    Pts mouth and face swells.   . Ace Inhibitors Cough  . Norvasc [Amlodipine Besylate] Palpitations    Allergies as of 06/03/2017      Reactions   Ambien [zolpidem Tartrate] Other (See Comments)   Reaction:  Hallucinations    Daypro [oxaprozin] Other (See Comments)   GI upset   Oxycodone Nausea And Vomiting   Shellfish Allergy Swelling, Other (See Comments)   Pts mouth and face swells.    Ace Inhibitors Cough   Norvasc [amlodipine  Besylate] Palpitations      Medication List       Accurate as of 06/03/17  9:53 AM. Always use your most recent med list.          acetaminophen 325 MG tablet Commonly known as:  TYLENOL Take 650 mg by mouth every 6 (six) hours as needed.   alum & mag hydroxide-simeth 941-740-81 MG/5ML suspension Commonly known as:  MAALOX PLUS Take 30 mLs by mouth every 4 (four) hours as needed for indigestion.   amiodarone 100 MG tablet Commonly known as:  PACERONE Take 1 tablet (100 mg total) by mouth daily.   B-12 1000 MCG Tabs Take 1 tablet by mouth daily.   baclofen 10 MG tablet Commonly known as:  LIORESAL Take 0.5 tablets (5  mg total) by mouth every 8 (eight) hours as needed for muscle spasms.   benzonatate 100 MG capsule Commonly known as:  TESSALON Take 1 capsule (100 mg total) by mouth 3 (three) times daily as needed for cough.   camphor-menthol lotion Commonly known as:  SARNA Apply thin film to areas of itching 4 times daily as needed. [place] for family to apply and ok to leave at bedside.   capsaicin 0.025 % cream Commonly known as:  ZOSTRIX Massage cream onto the posterior neck and shoulders topically 3 times daily for muscle cramps   Cholecalciferol 2000 units Caps Take 2 capsules by mouth daily. 4000 units = 2 caps   citalopram 20 MG tablet Commonly known as:  CELEXA Take 20 mg by mouth daily.   DERMACLOUD Crea Apply liberal amount topically to area of skin irritation as needed. Ok to leave at bedside.   ELIQUIS 2.5 MG Tabs tablet Generic drug:  apixaban Take 2.5 mg by mouth 2 (two) times daily.   feeding supplement (NEPRO CARB STEADY) Liqd Take 1,000 mLs by mouth 2 (two) times daily between meals.   feeding supplement (PRO-STAT SUGAR FREE 64) Liqd Take 30 mLs by mouth 2 (two) times daily between meals.   ferrous sulfate 325 (65 FE) MG EC tablet Take 325 mg by mouth 2 (two) times daily.   furosemide 20 MG tablet Commonly known as:  LASIX Take 20 mg by mouth daily.   gabapentin 100 MG capsule Commonly known as:  NEURONTIN Take 1 capsule (100 mg total) by mouth 3 (three) times daily. Hold for excessive sleepiness (sedation)   ipratropium-albuterol 0.5-2.5 (3) MG/3ML Soln Commonly known as:  DUONEB Take 3 mLs by nebulization every 6 (six) hours as needed.   irbesartan 150 MG tablet Commonly known as:  AVAPRO Take 150 mg by mouth daily.   levothyroxine 25 MCG tablet Commonly known as:  SYNTHROID, LEVOTHROID Take 25 mcg by mouth daily before breakfast.   lidocaine 5 % Commonly known as:  LIDODERM Place 1 patch onto the skin every 12 (twelve) hours. Remove & Discard patch within  12 hours or as directed by MD   metoprolol tartrate 25 MG tablet Commonly known as:  LOPRESSOR Take 12.5 mg by mouth 2 (two) times daily.   mirtazapine 15 MG tablet Commonly known as:  REMERON Take 15 mg by mouth at bedtime.   omeprazole 20 MG capsule Commonly known as:  PRILOSEC Take 1 capsule by mouth daily.   raloxifene 60 MG tablet Commonly known as:  EVISTA Take 1 tablet by mouth daily.   risperiDONE 0.25 MG tablet Commonly known as:  RISPERDAL TAKE ONE TABLET EVERY DAY AT BEDTIME   senna-docusate 8.6-50 MG tablet Commonly  known as:  Senokot-S Take 2 tablets by mouth 2 (two) times daily.   SYSTANE 0.4-0.3 % Soln Generic drug:  Polyethyl Glycol-Propyl Glycol Place 1 drop into both eyes as needed.   traMADol 50 MG tablet Commonly known as:  ULTRAM Take 1 tablet (50 mg total) by mouth 2 (two) times daily.   vitamin C 500 MG tablet Commonly known as:  ASCORBIC ACID Take 500 mg by mouth 2 (two) times daily.       Review of Systems  Constitutional: Negative for activity change, appetite change, chills, diaphoresis and fever.  HENT: Negative for congestion, sneezing, sore throat, trouble swallowing and voice change.   Respiratory: Negative for apnea, cough, choking, chest tightness, shortness of breath and wheezing.   Cardiovascular: Negative for chest pain, palpitations and leg swelling.  Gastrointestinal: Negative for abdominal distention, abdominal pain, constipation, diarrhea and nausea.  Genitourinary: Negative for difficulty urinating, dysuria, frequency and urgency.  Musculoskeletal: Positive for arthralgias (typical arthritis). Negative for back pain, gait problem, myalgias, neck pain and neck stiffness.  Skin: Negative for color change, pallor, rash and wound.  Neurological: Positive for weakness. Negative for dizziness, tremors, syncope, speech difficulty, numbness and headaches.  Psychiatric/Behavioral: Negative for agitation and behavioral problems.  All  other systems reviewed and are negative.   Immunization History  Administered Date(s) Administered  . Influenza Inj Mdck Quad Pf 06/16/2016  . Influenza,inj,Quad PF,6+ Mos 08/05/2015  . Influenza-Unspecified 07/08/2012, 07/14/2013, 07/13/2014  . PPD Test 09/12/2015, 09/26/2015  . Pneumococcal Polysaccharide-23 08/15/2013, 08/05/2015   Pertinent  Health Maintenance Due  Topic Date Due  . DEXA SCAN  11/28/1993  . PNA vac Low Risk Adult (2 of 2 - PCV13) 08/04/2016  . INFLUENZA VACCINE  04/22/2017   Fall Risk  05/05/2017 04/20/2017 09/23/2016 07/23/2016 03/24/2016  Falls in the past year? No No No No No  Comment - - - - -  Number falls in past yr: - - - - -  Injury with Fall? - - - - -  Comment - - - - -  Risk Factor Category  - - - - -  Risk for fall due to : - - - - -  Follow up - - - - -  Comment - - - - -   Functional Status Survey:    Vitals:   06/03/17 0908  BP: (!) 115/32  Pulse: 88  Resp: 20  Temp: 98.6 F (37 C)  SpO2: 96%  Weight: 183 lb 3.2 oz (83.1 kg)  Height: 5' 7"  (1.702 m)   Body mass index is 28.69 kg/m. Physical Exam  Constitutional: She is oriented to person, place, and time. Vital signs are normal. She appears well-developed and well-nourished. She is active and cooperative. She does not appear ill. No distress.  HENT:  Head: Normocephalic and atraumatic.  Mouth/Throat: Uvula is midline, oropharynx is clear and moist and mucous membranes are normal. Mucous membranes are not pale, not dry and not cyanotic.  Eyes: Pupils are equal, round, and reactive to light. Conjunctivae, EOM and lids are normal.  Neck: Trachea normal, normal range of motion and full passive range of motion without pain. Neck supple. No JVD present. No tracheal deviation, no edema and no erythema present. No thyromegaly present.  Cardiovascular: Normal rate, regular rhythm, intact distal pulses and normal pulses.  Exam reveals no gallop, no distant heart sounds and no friction rub.     Murmur heard. Pulses:      Dorsalis pedis pulses are 2+ on  the right side, and 2+ on the left side.  Pulmonary/Chest: Effort normal. No accessory muscle usage. No respiratory distress. She has no decreased breath sounds. She has no rhonchi. She has no rales. She exhibits no tenderness.  Abdominal: Normal appearance and bowel sounds are normal. She exhibits no distension and no ascites. There is no tenderness.  Musculoskeletal: Normal range of motion. She exhibits no edema or tenderness.       Cervical back: She exhibits pain and spasm.  Expected osteoarthritis, stiffness  Neurological: She is alert and oriented to person, place, and time. She has normal strength.  Skin: Skin is warm, dry and intact. No rash noted. She is not diaphoretic. No cyanosis or erythema. No pallor. Nails show no clubbing.  Psychiatric: She has a normal mood and affect. Her speech is normal and behavior is normal. Judgment and thought content normal. Cognition and memory are impaired. She exhibits abnormal recent memory.  Nursing note and vitals reviewed.   Labs reviewed:  Recent Labs  05/20/17 0455 05/21/17 0532 05/26/17 0632  NA 136 134* 136  K 4.6 4.4 4.4  CL 105 103 106  CO2 21* 20* 21*  GLUCOSE 87 63* 92  BUN 67* 63* 79*  CREATININE 2.89* 2.71* 2.66*  CALCIUM 8.6* 8.8* 8.9    Recent Labs  06/09/16 1326 05/18/17 1500  AST 30 48*  ALT 18 37  ALKPHOS 53 76  BILITOT <0.2 0.5  PROT 7.4 6.8  ALBUMIN 4.1 2.7*    Recent Labs  05/21/17 0532 05/22/17 0510 05/26/17 0632  WBC 13.0* 10.5 7.2  NEUTROABS 10.8* 8.2* 5.0  HGB 7.8* 7.8* 8.4*  HCT 25.0* 24.5* 26.3*  MCV 88.8 87.2 87.6  PLT 264 278 323   Lab Results  Component Value Date   TSH 3.669 05/18/2017   Lab Results  Component Value Date   HGBA1C 5.6 12/10/2015   Lab Results  Component Value Date   CHOL 174 12/10/2015   HDL 44 12/10/2015   LDLCALC 107 (H) 12/10/2015   TRIG 115 12/10/2015   CHOLHDL 4.0 12/10/2015    Significant  Diagnostic Results in last 30 days:  Dg Chest 1 View  Result Date: 05/14/2017 CLINICAL DATA:  Worsening productive cough EXAM: CHEST 1 VIEW COMPARISON:  05/13/2017 FINDINGS: The heart size and mediastinal contours are within normal limits. Both lungs are clear. The visualized skeletal structures are unremarkable. IMPRESSION: No active disease. Electronically Signed   By: Kathreen Devoid   On: 05/14/2017 12:16   Dg Chest 2 View  Result Date: 05/13/2017 CLINICAL DATA:  Coughing and vomiting. EXAM: CHEST  2 VIEW COMPARISON:  12/13/2015 FINDINGS: The heart is mildly enlarged but stable. There is mild tortuosity of the thoracic aorta. The pulmonary hila appear normal. The lungs are clear of an acute process. Mild chronic bronchitic changes and chronic apical pleural and parenchymal scarring. Right-sided rib fractures are noted. Some of these appear chronic. The sixth right posterior rib fracture could be acute. There is a severe compression deformity of L1. This is new since a prior chest x-ray from 2017. IMPRESSION: No acute cardiopulmonary findings. Remote and possibly acute right rib fractures. L1 compression fracture of uncertain age but new since chest x-rays from 2017. Electronically Signed   By: Marijo Sanes M.D.   On: 05/13/2017 14:02   Ct Lumbar Spine Wo Contrast  Result Date: 05/04/2017 CLINICAL DATA:  Chronic rapidly progressive low back pain radiating into both legs. EXAM: CT LUMBAR SPINE WITHOUT CONTRAST TECHNIQUE: Multidetector CT  imaging of the lumbar spine was performed without intravenous contrast administration. Multiplanar CT image reconstructions were also generated. COMPARISON:  CT myelogram dated 02/08/2014 and lumbar radiographs dated 01/26/2014 FINDINGS: Segmentation: 5 lumbar type vertebrae. Alignment: Chronic grade 1 spondylolisthesis at L4-5. Vertebrae: Acute benign-appearing compression fracture of L1, 40%. Minimal protrusion of the posterior inferior aspect of L1 into the spinal  canal without visible neural impingement. Small paraspinal hematoma. Paraspinal and other soft tissues: Small paraspinal hematoma around the L1 vertebral body fracture. Aortic atherosclerosis. Disc levels: T11-12: Negative. T12-L1: Normal appearing disc. Comminuted fracture of L1, benign-appearing. L1-2: Diffuse bulge of the disc. Slight protrusion of the posterior inferior aspect of L1 into the spinal canal secondary to the compression fracture. No focal neural impingement. Small paraspinal hematoma. L2-3: Chronic disc space narrowing with degenerative changes of the vertebral endplates. Previously demonstrated broad-based soft disc protrusion slightly compressing the thecal sac. L3-4: Mild broad-based soft disc protrusion with hypertrophy of the ligamentum flavum combine to create moderate spinal stenosis. This appears increased in severity since the prior CT myelogram. L4-5: 5 mm spondylolisthesis with a broad-based bulge of the uncovered disc without focal neural impingement. Severe bilateral facet arthritis. The findings are unchanged. L5-S1: Chronic marked disc space narrowing. Small broad-based disc bulge without neural impingement. No foraminal stenosis. IMPRESSION: 1. Acute benign-appearing compression fracture of L1 with slight protrusion of bone into the spinal canal without neural impingement. 2. Progressive moderate spinal stenosis at L3-4. 3. Stable spondylolisthesis and degenerative disc disease at L4-5. 4. Stable degenerative disc disease at L2-3 and L5-S1. Electronically Signed   By: Lorriane Shire M.D.   On: 05/04/2017 12:24   Dg Foot Complete Left  Result Date: 05/15/2017 CLINICAL DATA:  Second through fifth toe pain. EXAM: LEFT FOOT - COMPLETE 3+ VIEW COMPARISON:  August 06, 2014. FINDINGS: There is no evidence of fracture or dislocation. Healed fracture of the base of the fifth metatarsal. Mild midfoot degenerative changes. Small plantar and Achilles enthesophytes, unchanged. Osteopenia.  Vascular calcifications. Soft tissues are unremarkable. IMPRESSION: 1.  No acute osseous abnormality. Electronically Signed   By: Titus Dubin M.D.   On: 05/15/2017 09:34    Assessment/Plan 1. Generalized weakness  Continue working with PT/OT  Continue exercises as taught by PT/OT  Ambulate with assistance  Labs prior to next visit  2. Spinal stenosis, lumbar region, with neurogenic claudication  Improved  Continue tramadol 50 mg po BID for pain  Continue APAP 650 mg po Q 6 hours prn  Continue Lidoderm patch 5% Q Day  3. Muscle spasm  Continue pain control as listed above  Continue Capsaicin 0.025 % cream to neck TID prn  Family/ staff Communication:   Total Time:  Documentation:  Face to Face:  Family/Phone:   Labs/tests ordered:  Cbc, met c  Medication list reviewed and assessed for continued appropriateness. Monthly medication orders reviewed and signed.  Vikki Ports, NP-C Geriatrics [facility] [phone], [place] [address] Cell Phone (Mon-Fri 8am-5pm):  [phone] On Call:  [phone] & follow prompts after 5pm & weekends Office Phone:  [phone] Office Fax:  [phone]

## 2017-06-05 ENCOUNTER — Other Ambulatory Visit: Payer: Self-pay | Admitting: Physical Medicine & Rehabilitation

## 2017-06-06 DIAGNOSIS — R531 Weakness: Secondary | ICD-10-CM | POA: Insufficient documentation

## 2017-06-08 NOTE — Telephone Encounter (Signed)
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

## 2017-06-09 ENCOUNTER — Other Ambulatory Visit
Admission: RE | Admit: 2017-06-09 | Discharge: 2017-06-09 | Disposition: A | Discharge status: Home / Self Care | Payer: Medicare Other | Source: Ambulatory Visit | Attending: Gerontology | Admitting: Gerontology

## 2017-06-09 DIAGNOSIS — I130 Hypertensive heart and chronic kidney disease with heart failure and stage 1 through stage 4 chronic kidney disease, or unspecified chronic kidney disease: Secondary | ICD-10-CM | POA: Diagnosis present

## 2017-06-09 LAB — COMPREHENSIVE METABOLIC PANEL
ALBUMIN: 2.8 g/dL — AB (ref 3.5–5.0)
ALK PHOS: 55 U/L (ref 38–126)
ALT: 19 U/L (ref 14–54)
AST: 31 U/L (ref 15–41)
Anion gap: 9 (ref 5–15)
BILIRUBIN TOTAL: 0.5 mg/dL (ref 0.3–1.2)
BUN: 53 mg/dL — AB (ref 6–20)
CO2: 23 mmol/L (ref 22–32)
Calcium: 8.5 mg/dL — ABNORMAL LOW (ref 8.9–10.3)
Chloride: 106 mmol/L (ref 101–111)
Creatinine, Ser: 2.18 mg/dL — ABNORMAL HIGH (ref 0.44–1.00)
GFR calc Af Amer: 22 mL/min — ABNORMAL LOW (ref >60)
GFR calc non Af Amer: 19 mL/min — ABNORMAL LOW (ref >60)
GLUCOSE: 77 mg/dL (ref 65–99)
POTASSIUM: 3.4 mmol/L — AB (ref 3.5–5.1)
Sodium: 138 mmol/L (ref 135–145)
TOTAL PROTEIN: 6 g/dL — AB (ref 6.5–8.1)

## 2017-06-09 LAB — CBC WITH DIFFERENTIAL/PLATELET
BASOS ABS: 0.1 10*3/uL (ref 0–0.1)
BASOS PCT: 1 %
EOS ABS: 1.2 10*3/uL — AB (ref 0–0.7)
Eosinophils Relative: 14 %
HEMATOCRIT: 28 % — AB (ref 35.0–47.0)
HEMOGLOBIN: 9 g/dL — AB (ref 12.0–16.0)
Lymphocytes Relative: 16 %
Lymphs Abs: 1.4 10*3/uL (ref 1.0–3.6)
MCH: 29.4 pg (ref 26.0–34.0)
MCHC: 32.3 g/dL (ref 32.0–36.0)
MCV: 90.8 fL (ref 80.0–100.0)
Monocytes Absolute: 0.9 10*3/uL (ref 0.2–0.9)
Monocytes Relative: 10 %
NEUTROS ABS: 5.2 10*3/uL (ref 1.4–6.5)
Neutrophils Relative %: 59 %
Platelets: 200 10*3/uL (ref 150–440)
RBC: 3.08 MIL/uL — ABNORMAL LOW (ref 3.80–5.20)
RDW: 23.6 % — AB (ref 11.5–14.5)
WBC: 8.8 10*3/uL (ref 3.6–11.0)

## 2017-06-10 ENCOUNTER — Non-Acute Institutional Stay (SKILLED_NURSING_FACILITY): Payer: Medicare Other | Admitting: Gerontology

## 2017-06-10 ENCOUNTER — Encounter: Payer: Self-pay | Admitting: Gerontology

## 2017-06-10 DIAGNOSIS — R531 Weakness: Secondary | ICD-10-CM

## 2017-06-10 DIAGNOSIS — M48062 Spinal stenosis, lumbar region with neurogenic claudication: Secondary | ICD-10-CM

## 2017-06-10 DIAGNOSIS — M62838 Other muscle spasm: Secondary | ICD-10-CM | POA: Diagnosis not present

## 2017-06-10 NOTE — Progress Notes (Signed)
Location:   [facility] Room Number: 203B Place of Service:  SNF [phone]) Provider:  Toni Arthurs, NP-C  Derinda Late, MD  Patient Care Team: Derinda Late, MD as PCP - General (Family Medicine)  Extended Emergency Contact Information Primary Emergency Contact: Renato Battles [place] of [place] Mobile Phone: [phone] Relation: Son Secondary Emergency Contact: Betsey Holiday [place] Phone: [phone] Relation: Friend  Code Status:  DNR Goals of care: Advanced Directive information Advanced Directives 06/10/2017  Does Patient Have a Medical Advance Directive? Yes  Type of Advance Directive Out of facility DNR (pink MOST or yellow form)  Does patient want to make changes to medical advance directive? No - Patient declined  Copy of [place] in Chart? -     Chief Complaint  Patient presents with  . Medical Management of Chronic Issues    Routine Visit    HPI:  Pt is a 83 y.o. female seen today for routine follow up, s/p admission to facility for rehab following spinal injection for pain and UTI. Pt has been participating in PT/OT. She continues to be intermittently detail confused. Short term memory deficits. Pt reports her pain is well controlled on current regimen. She denies cough or congestion. Denies n/v/d/f/c/cp/sob/ha/abd pain/dizziness. She reports her appetite is fair. Voiding well. Having regular BMs. Sitter present and confirms pt is doing well. VSS. No other complaints.      Past Medical History:  Diagnosis Date  . Allergy   . Allergy to shellfish    causes swelling  . Brain bleed ([place])   . Chronic back pain   . Chronic systolic CHF (congestive heart failure) ([place])   . Depression    unspecified  . Foot fracture    bilateral  . History of abnormal mammogram    Status post breast biopsy x 2, which were negative.   [name] History of chicken pox   . History of sinus tachycardia 11/1997     With dyspnea on exertion. Workup included a VQ scan which was normal, and ultrasound of her lower extremities, which was normal with no evidence of DVT.  [name] Hypertension   . Nocturnal leg cramps   . Osteoarthritis   . Osteoporosis   . Pelvis fracture ([place])   . Peptic ulcer disease   . Renal disorder   . Spinal stenosis   . V-tach ([place])   . Wrist fracture, right    Past Surgical History:  Procedure Laterality Date  . BACK SURGERY N/A   . BREAST LUMPECTOMY N/A   . FRACTURE SURGERY  08/25/2013   ORIF left distal radius fracture.  . WISDOM TOOTH EXTRACTION      Allergies  Allergen Reactions  . Ambien [Zolpidem Tartrate] Other (See Comments)    Reaction:  Hallucinations   . Daypro [Oxaprozin] Other (See Comments)    GI upset  . Oxycodone Nausea And Vomiting  . Shellfish Allergy Swelling and Other (See Comments)    Pts mouth and face swells.   . Ace Inhibitors Cough  . Norvasc [Amlodipine Besylate] Palpitations    Allergies as of 06/10/2017      Reactions   Ambien [zolpidem Tartrate] Other (See Comments)   Reaction:  Hallucinations    Daypro [oxaprozin] Other (See Comments)   GI upset   Oxycodone Nausea And Vomiting   Shellfish Allergy Swelling, Other (See Comments)   Pts mouth and face swells.    Ace Inhibitors Cough   Norvasc [amlodipine  Besylate] Palpitations      Medication List       Accurate as of 06/10/17  9:03 AM. Always use your most recent med list.          acetaminophen 325 MG tablet Commonly known as:  TYLENOL Take 650 mg by mouth every 6 (six) hours as needed.   alum & mag hydroxide-simeth 256-389-37 MG/5ML suspension Commonly known as:  MAALOX PLUS Take 30 mLs by mouth every 4 (four) hours as needed for indigestion.   amiodarone 100 MG tablet Commonly known as:  PACERONE Take 1 tablet (100 mg total) by mouth daily.   B-12 1000 MCG Tabs Take 1 tablet by mouth daily.   baclofen 10 MG tablet Commonly known as:  LIORESAL Take 0.5 tablets (5  mg total) by mouth every 8 (eight) hours as needed for muscle spasms.   benzonatate 100 MG capsule Commonly known as:  TESSALON Take 1 capsule (100 mg total) by mouth 3 (three) times daily as needed for cough.   camphor-menthol lotion Commonly known as:  SARNA Apply thin film to areas of itching 4 times daily as needed. [place] for family to apply and ok to leave at bedside.   capsaicin 0.025 % cream Commonly known as:  ZOSTRIX Massage cream onto the posterior neck and shoulders topically 3 times daily for muscle cramps   Cholecalciferol 2000 units Caps Take 2 capsules by mouth daily. 4000 units = 2 caps   citalopram 20 MG tablet Commonly known as:  CELEXA Take 20 mg by mouth daily.   DERMACLOUD Crea Apply liberal amount topically to area of skin irritation as needed. Ok to leave at bedside.   ELIQUIS 2.5 MG Tabs tablet Generic drug:  apixaban Take 2.5 mg by mouth 2 (two) times daily.   feeding supplement (NEPRO CARB STEADY) Liqd Take 1,000 mLs by mouth 2 (two) times daily between meals.   feeding supplement (PRO-STAT SUGAR FREE 64) Liqd Take 30 mLs by mouth 2 (two) times daily between meals.   ferrous sulfate 325 (65 FE) MG EC tablet Take 325 mg by mouth 2 (two) times daily.   furosemide 20 MG tablet Commonly known as:  LASIX Take 20 mg by mouth daily.   gabapentin 100 MG capsule Commonly known as:  NEURONTIN Take 1 capsule (100 mg total) by mouth 3 (three) times daily. Hold for excessive sleepiness (sedation)   ipratropium-albuterol 0.5-2.5 (3) MG/3ML Soln Commonly known as:  DUONEB Take 3 mLs by nebulization every 6 (six) hours as needed.   irbesartan 150 MG tablet Commonly known as:  AVAPRO Take 150 mg by mouth daily.   levothyroxine 25 MCG tablet Commonly known as:  SYNTHROID, LEVOTHROID Take 25 mcg by mouth daily before breakfast.   lidocaine 5 % Commonly known as:  LIDODERM Place 1 patch onto the skin every 12 (twelve) hours. Remove & Discard patch within  12 hours or as directed by MD   metoprolol tartrate 25 MG tablet Commonly known as:  LOPRESSOR Take 12.5 mg by mouth 2 (two) times daily.   mirtazapine 15 MG tablet Commonly known as:  REMERON Take 15 mg by mouth at bedtime.   omeprazole 20 MG capsule Commonly known as:  PRILOSEC Take 1 capsule by mouth daily.   potassium chloride 10 MEQ CR capsule Commonly known as:  MICRO-K Take 10 mEq by mouth daily.   raloxifene 60 MG tablet Commonly known as:  EVISTA Take 1 tablet by mouth daily.   ranitidine 75 MG tablet  Commonly known as:  ZANTAC Take 75 mg by mouth 2 (two) times daily.   risperiDONE 0.25 MG tablet Commonly known as:  RISPERDAL TAKE ONE TABLET EVERY DAY AT BEDTIME   SENNA-PLUS 8.6-50 MG tablet Generic drug:  senna-docusate Take 1 tablet by mouth 2 (two) times daily.   SYSTANE 0.4-0.3 % Soln Generic drug:  Polyethyl Glycol-Propyl Glycol Place 1 drop into both eyes as needed.   traMADol 50 MG tablet Commonly known as:  ULTRAM Take 1 tablet (50 mg total) by mouth 2 (two) times daily.   vitamin C 500 MG tablet Commonly known as:  ASCORBIC ACID Take 500 mg by mouth 2 (two) times daily.       Review of Systems  Constitutional: Negative for activity change, appetite change, chills, diaphoresis and fever.  HENT: Negative for congestion, sneezing, sore throat, trouble swallowing and voice change.   Respiratory: Negative for apnea, cough, choking, chest tightness, shortness of breath and wheezing.   Cardiovascular: Negative for chest pain, palpitations and leg swelling.  Gastrointestinal: Negative for abdominal distention, abdominal pain, constipation, diarrhea and nausea.  Genitourinary: Negative for difficulty urinating, dysuria, frequency and urgency.  Musculoskeletal: Positive for arthralgias (typical arthritis). Negative for back pain, gait problem, myalgias, neck pain and neck stiffness.  Skin: Negative for color change, pallor, rash and wound.    Neurological: Positive for weakness. Negative for dizziness, tremors, syncope, speech difficulty, numbness and headaches.  Psychiatric/Behavioral: Negative for agitation and behavioral problems.  All other systems reviewed and are negative.   Immunization History  Administered Date(s) Administered  . Influenza Inj Mdck Quad Pf 06/16/2016  . Influenza,inj,Quad PF,6+ Mos 08/05/2015  . Influenza-Unspecified 07/08/2012, 07/14/2013, 07/13/2014  . PPD Test 09/12/2015, 09/26/2015  . Pneumococcal Polysaccharide-23 08/15/2013, 08/05/2015   Pertinent  Health Maintenance Due  Topic Date Due  . DEXA SCAN  11/28/1993  . PNA vac Low Risk Adult (2 of 2 - PCV13) 08/04/2016  . INFLUENZA VACCINE  04/22/2017   Fall Risk  05/05/2017 04/20/2017 09/23/2016 07/23/2016 03/24/2016  Falls in the past year? _0   Comment - - - - -  Number falls in past yr: - - - - -  Injury with Fall? - - - - -  Comment - - - - -  Risk Factor Category  - - - - -  Risk for fall due to : - - - - -  Follow up - - - - -  Comment - - - - -   Functional Status Survey:    Vitals:   06/10/17 0849  BP: (!) 150/47  Pulse: 68  Resp: 17  Temp: 98 F (36.7 C)  SpO2: 94%  Weight: 183 lb 3.2 oz (83.1 kg)  Height: _1  (1.702 m)   Body mass index is 28.69 kg/m. Physical Exam  Constitutional: She is oriented to person, place, and time. Vital signs are normal. She appears well-developed and well-nourished. She is active and cooperative. She does not appear ill. No distress.  HENT:  Head: Normocephalic and atraumatic.  Mouth/Throat: Uvula is midline, oropharynx is clear and moist and mucous membranes are normal. Mucous membranes are not pale, not dry and not cyanotic.  Eyes: Pupils are equal, round, and reactive to light. Conjunctivae, EOM and lids are normal.  Neck: Trachea normal, normal range of motion and full passive range of motion without pain. Neck supple. No JVD present. No tracheal deviation, no edema and no  erythema present. No thyromegaly present.  Cardiovascular: Normal  rate, regular rhythm, intact distal pulses and normal pulses.  Exam reveals no gallop, no distant heart sounds and no friction rub.   Murmur heard. Pulses:      Dorsalis pedis pulses are 2+ on the right side, and 2+ on the left side.  Pulmonary/Chest: Effort normal. No accessory muscle usage. No respiratory distress. She has no decreased breath sounds. She has no rhonchi. She has no rales. She exhibits no tenderness.  Abdominal: Normal appearance and bowel sounds are normal. She exhibits no distension and no ascites. There is no tenderness.  Musculoskeletal: Normal range of motion. She exhibits no edema or tenderness.       Cervical back: She exhibits pain and spasm.  Expected osteoarthritis, stiffness  Neurological: She is alert and oriented to person, place, and time. She has normal strength.  Skin: Skin is warm, dry and intact. No rash noted. She is not diaphoretic. No cyanosis or erythema. No pallor. Nails show no clubbing.  Psychiatric: She has a normal mood and affect. Her speech is normal and behavior is normal. Judgment and thought content normal. Cognition and memory are impaired. She exhibits abnormal recent memory.  Nursing note and vitals reviewed.   Labs reviewed:  Recent Labs  05/21/17 0532 05/26/17 0632 06/09/17 0453  NA 134* 136 138  K 4.4 4.4 3.4*  CL 103 106 106  CO2 20* 21* 23  GLUCOSE 63* 92 77  BUN 63* 79* 53*  CREATININE 2.71* 2.66* 2.18*  CALCIUM 8.8* 8.9 8.5*    Recent Labs  05/18/17 1500 06/09/17 0453  AST 48* 31  ALT 37 19  ALKPHOS 76 55  BILITOT 0.5 0.5  PROT 6.8 6.0*  ALBUMIN 2.7* 2.8*    Recent Labs  05/22/17 0510 05/26/17 0632 06/09/17 0453  WBC 10.5 7.2 8.8  NEUTROABS 8.2* 5.0 5.2  HGB 7.8* 8.4* 9.0*  HCT 24.5* 26.3* 28.0*  MCV 87.2 87.6 90.8  PLT 278 323 200   Lab Results  Component Value Date   TSH 3.669 05/18/2017   Lab Results  Component Value Date    HGBA1C 5.6 12/10/2015   Lab Results  Component Value Date   CHOL 174 12/10/2015   HDL 44 12/10/2015   LDLCALC 107 (H) 12/10/2015   TRIG 115 12/10/2015   CHOLHDL 4.0 12/10/2015    Significant Diagnostic Results in last 30 days:  Dg Chest 1 View  Result Date: 05/14/2017 CLINICAL DATA:  Worsening productive cough EXAM: CHEST 1 VIEW COMPARISON:  05/13/2017 FINDINGS: The heart size and mediastinal contours are within normal limits. Both lungs are clear. The visualized skeletal structures are unremarkable. IMPRESSION: No active disease. Electronically Signed   By: Kathreen Devoid   On: 05/14/2017 12:16   Dg Chest 2 View  Result Date: 05/13/2017 CLINICAL DATA:  Coughing and vomiting. EXAM: CHEST  2 VIEW COMPARISON:  12/13/2015 FINDINGS: The heart is mildly enlarged but stable. There is mild tortuosity of the thoracic aorta. The pulmonary hila appear normal. The lungs are clear of an acute process. Mild chronic bronchitic changes and chronic apical pleural and parenchymal scarring. Right-sided rib fractures are noted. Some of these appear chronic. The sixth right posterior rib fracture could be acute. There is a severe compression deformity of L1. This is new since a prior chest x-ray from 2017. IMPRESSION: No acute cardiopulmonary findings. Remote and possibly acute right rib fractures. L1 compression fracture of uncertain age but new since chest x-rays from 2017. Electronically Signed   By: Ricky Stabs.D.  On: 05/13/2017 14:02   Dg Foot Complete Left  Result Date: 05/15/2017 CLINICAL DATA:  Second through fifth toe pain. EXAM: LEFT FOOT - COMPLETE 3+ VIEW COMPARISON:  August 06, 2014. FINDINGS: There is no evidence of fracture or dislocation. Healed fracture of the base of the fifth metatarsal. Mild midfoot degenerative changes. Small plantar and Achilles enthesophytes, unchanged. Osteopenia. Vascular calcifications. Soft tissues are unremarkable. IMPRESSION: 1.  No acute osseous abnormality.  Electronically Signed   By: Titus Dubin M.D.   On: 05/15/2017 09:34    Assessment/Plan 1. Generalized weakness  Continue working with PT/OT  Continue exercises as taught by PT/OT  Ambulate with assistance  Labs prior to next visit  2. Spinal stenosis, lumbar region, with neurogenic claudication  Improved  Continue tramadol 50 mg po BID for pain  Continue APAP 650 mg po Q 6 hours prn  Continue Lidoderm patch 5% Q Day  3. Muscle spasm  Continue pain control as listed above  Continue Capsaicin 0.025 % cream to neck TID prn  Family/ staff Communication:   Total Time:  Documentation:  Face to Face:  Family/Phone:   Labs/tests ordered:  Cbc, met c  Medication list reviewed and assessed for continued appropriateness. Monthly medication orders reviewed and signed.  Vikki Ports, NP-C Geriatrics [facility] [phone], [place] [address] Cell Phone (Mon-Fri 8am-5pm):  [phone] On Call:  [phone] & follow prompts after 5pm & weekends Office Phone:  [phone] Office Fax:  [phone]

## 2017-06-12 ENCOUNTER — Non-Acute Institutional Stay (SKILLED_NURSING_FACILITY): Payer: Medicare Other | Admitting: Gerontology

## 2017-06-12 ENCOUNTER — Other Ambulatory Visit: Payer: Self-pay | Admitting: Gerontology

## 2017-06-12 ENCOUNTER — Encounter: Payer: Self-pay | Admitting: Gerontology

## 2017-06-12 ENCOUNTER — Other Ambulatory Visit
Admission: RE | Admit: 2017-06-12 | Discharge: 2017-06-12 | Disposition: A | Discharge status: Home / Self Care | Payer: Medicare Other | Source: Skilled Nursing Facility | Attending: Gerontology | Admitting: Gerontology

## 2017-06-12 ENCOUNTER — Ambulatory Visit
Admission: RE | Admit: 2017-06-12 | Discharge: 2017-06-12 | Disposition: A | Discharge status: Home / Self Care | Payer: Medicare Other | Source: Ambulatory Visit | Attending: Gerontology | Admitting: Gerontology

## 2017-06-12 DIAGNOSIS — M48061 Spinal stenosis, lumbar region without neurogenic claudication: Secondary | ICD-10-CM | POA: Diagnosis not present

## 2017-06-12 DIAGNOSIS — M62838 Other muscle spasm: Secondary | ICD-10-CM

## 2017-06-12 DIAGNOSIS — M4856XA Collapsed vertebra, not elsewhere classified, lumbar region, initial encounter for fracture: Secondary | ICD-10-CM | POA: Diagnosis not present

## 2017-06-12 DIAGNOSIS — M4306 Spondylolysis, lumbar region: Secondary | ICD-10-CM | POA: Insufficient documentation

## 2017-06-12 DIAGNOSIS — R29898 Other symptoms and signs involving the musculoskeletal system: Secondary | ICD-10-CM

## 2017-06-12 DIAGNOSIS — M5126 Other intervertebral disc displacement, lumbar region: Secondary | ICD-10-CM | POA: Insufficient documentation

## 2017-06-12 DIAGNOSIS — M5137 Other intervertebral disc degeneration, lumbosacral region: Secondary | ICD-10-CM | POA: Insufficient documentation

## 2017-06-12 DIAGNOSIS — R200 Anesthesia of skin: Secondary | ICD-10-CM | POA: Diagnosis not present

## 2017-06-12 DIAGNOSIS — R531 Weakness: Secondary | ICD-10-CM | POA: Insufficient documentation

## 2017-06-12 DIAGNOSIS — Z7901 Long term (current) use of anticoagulants: Secondary | ICD-10-CM | POA: Insufficient documentation

## 2017-06-12 LAB — COMPREHENSIVE METABOLIC PANEL
ALT: 22 U/L (ref 14–54)
AST: 35 U/L (ref 15–41)
Albumin: 3.4 g/dL — ABNORMAL LOW (ref 3.5–5.0)
Alkaline Phosphatase: 68 U/L (ref 38–126)
Anion gap: 11 (ref 5–15)
BUN: 58 mg/dL — ABNORMAL HIGH (ref 6–20)
CHLORIDE: 99 mmol/L — AB (ref 101–111)
CO2: 24 mmol/L (ref 22–32)
Calcium: 9 mg/dL (ref 8.9–10.3)
Creatinine, Ser: 2.36 mg/dL — ABNORMAL HIGH (ref 0.44–1.00)
GFR, EST AFRICAN AMERICAN: 20 mL/min — AB (ref >60)
GFR, EST NON AFRICAN AMERICAN: 17 mL/min — AB (ref >60)
Glucose, Bld: 114 mg/dL — ABNORMAL HIGH (ref 65–99)
POTASSIUM: 3.8 mmol/L (ref 3.5–5.1)
Sodium: 134 mmol/L — ABNORMAL LOW (ref 135–145)
Total Bilirubin: 0.3 mg/dL (ref 0.3–1.2)
Total Protein: 7.3 g/dL (ref 6.5–8.1)

## 2017-06-12 LAB — CBC
HCT: 30.2 % — ABNORMAL LOW (ref 35.0–47.0)
Hemoglobin: 9.9 g/dL — ABNORMAL LOW (ref 12.0–16.0)
MCH: 30.5 pg (ref 26.0–34.0)
MCHC: 32.7 g/dL (ref 32.0–36.0)
MCV: 93.4 fL (ref 80.0–100.0)
PLATELETS: 220 10*3/uL (ref 150–440)
RBC: 3.23 MIL/uL — ABNORMAL LOW (ref 3.80–5.20)
RDW: 24.6 % — AB (ref 11.5–14.5)
WBC: 9.5 10*3/uL (ref 3.6–11.0)

## 2017-06-12 LAB — DIFFERENTIAL
BASOS ABS: 0.1 10*3/uL (ref 0–0.1)
BASOS PCT: 1 %
EOS ABS: 0.9 10*3/uL — AB (ref 0–0.7)
EOS PCT: 10 %
LYMPHS ABS: 1.1 10*3/uL (ref 1.0–3.6)
LYMPHS PCT: 12 %
Monocytes Absolute: 0.8 10*3/uL (ref 0.2–0.9)
Monocytes Relative: 9 %
NEUTROS ABS: 6.6 10*3/uL — AB (ref 1.4–6.5)
NEUTROS PCT: 68 %

## 2017-06-12 NOTE — Progress Notes (Signed)
Location:   [facility] Room Number: 203B Place of Service:  SNF (31) Provider:   , NP-C  Babaoff, Marcus, MD  Patient Care Team: Babaoff, Marcus, MD as PCP - General (Family Medicine)  Extended Emergency Contact Information Primary Emergency Contact: Corigliano,Eddie  [place] Mobile Phone: [phone] Relation: Son Secondary Emergency Contact: Stone,Susie  [place] Home Phone: [phone] Relation: Friend  Code Status:  DNR Goals of care: Advanced Directive information Advanced Directives 06/12/2017  Does Patient Have a Medical Advance Directive? Yes  Type of Advance Directive Out of facility DNR (pink MOST or yellow form)  Does patient want to make changes to medical advance directive? No - Patient declined  Copy of Healthcare Power of Attorney in Chart? -     Chief Complaint  Patient presents with  . Acute Visit    Follow up on leg numbness    HPI:  Pt is a 83 y.o. female seen today for an acute visit for acute onset muscle twitching/ spasms of the left leg. Pt reports feeling increased weakness in the leg, with twitching at rest. Pt denies new/ worsening pain in the back, hips or legs. Pt denies fall or trauma. Denies new numbness or tingling of the legs/ feet. Pt able to stand to the walker with minimal assistance. Weakened rectal tone on DRE. Pt report of increased incontinence. Recent spinal injection at levels L3,4,5. Denies increased pain on palpation at these sites. No tissue edema at these sites. Otherwise, VSS. No other complaints.     Past Medical History:  Diagnosis Date  . Allergy   . Allergy to shellfish    causes swelling  . Brain bleed (HCC)   . Chronic back pain   . Chronic systolic CHF (congestive heart failure) (HCC)   . Depression    unspecified  . Foot fracture    bilateral  . History of abnormal mammogram    Status post breast biopsy x 2, which were negative.   . History  of chicken pox   . History of sinus tachycardia 11/1997   With dyspnea on exertion. Workup included a VQ scan which was normal, and ultrasound of her lower extremities, which was normal with no evidence of DVT.  . Hypertension   . Nocturnal leg cramps   . Osteoarthritis   . Osteoporosis   . Pelvis fracture (HCC)   . Peptic ulcer disease   . Renal disorder   . Spinal stenosis   . V-tach (HCC)   . Wrist fracture, right    Past Surgical History:  Procedure Laterality Date  . BACK SURGERY N/A   . BREAST LUMPECTOMY N/A   . FRACTURE SURGERY  08/25/2013   ORIF left distal radius fracture.  . WISDOM TOOTH EXTRACTION      Allergies  Allergen Reactions  . Ambien [Zolpidem Tartrate] Other (See Comments)    Reaction:  Hallucinations   . Daypro [Oxaprozin] Other (See Comments)    GI upset  . Oxycodone Nausea And Vomiting  . Shellfish Allergy Swelling and Other (See Comments)    Pts mouth and face swells.   . Ace Inhibitors Cough  . Norvasc [Amlodipine Besylate] Palpitations    Allergies as of 06/12/2017      Reactions   Ambien [zolpidem Tartrate] Other (See Comments)   Reaction:  Hallucinations    Daypro [oxaprozin] Other (See Comments)   GI upset   Oxycodone Nausea And Vomiting   Shellfish Allergy Swelling, Other (  See Comments)   Pts mouth and face swells.    Ace Inhibitors Cough   Norvasc [amlodipine Besylate] Palpitations      Medication List       Accurate as of 06/12/17  2:28 PM. Always use your most recent med list.          acetaminophen 325 MG tablet Commonly known as:  TYLENOL Take 650 mg by mouth every 6 (six) hours as needed.   alum & mag hydroxide-simeth 400-400-40 MG/5ML suspension Commonly known as:  MAALOX PLUS Take 30 mLs by mouth every 4 (four) hours as needed for indigestion.   amiodarone 100 MG tablet Commonly known as:  PACERONE Take 1 tablet (100 mg total) by mouth daily.   B-12 1000 MCG Tabs Take 1 tablet by mouth daily.   baclofen 10 MG  tablet Commonly known as:  LIORESAL Take 0.5 tablets (5 mg total) by mouth every 8 (eight) hours as needed for muscle spasms.   benzonatate 100 MG capsule Commonly known as:  TESSALON Take 1 capsule (100 mg total) by mouth 3 (three) times daily as needed for cough.   camphor-menthol lotion Commonly known as:  SARNA Apply thin film to areas of itching 4 times daily as needed. Ok for family to apply and ok to leave at bedside.   capsaicin 0.025 % cream Commonly known as:  ZOSTRIX Massage cream onto the posterior neck and shoulders topically 3 times daily for muscle cramps   Cholecalciferol 2000 units Caps Take 2 capsules by mouth daily. 4000 units = 2 caps   citalopram 20 MG tablet Commonly known as:  CELEXA Take 20 mg by mouth daily.   DERMACLOUD Crea Apply liberal amount topically to area of skin irritation as needed. Ok to leave at bedside.   ELIQUIS 2.5 MG Tabs tablet Generic drug:  apixaban Take 2.5 mg by mouth 2 (two) times daily.   feeding supplement (NEPRO CARB STEADY) Liqd Take 1,000 mLs by mouth 2 (two) times daily between meals.   feeding supplement (PRO-STAT SUGAR FREE 64) Liqd Take 30 mLs by mouth 2 (two) times daily between meals.   ferrous sulfate 325 (65 FE) MG EC tablet Take 325 mg by mouth 2 (two) times daily.   furosemide 20 MG tablet Commonly known as:  LASIX Take 20 mg by mouth daily.   gabapentin 100 MG capsule Commonly known as:  NEURONTIN Take 1 capsule (100 mg total) by mouth 3 (three) times daily. Hold for excessive sleepiness (sedation)   ipratropium-albuterol 0.5-2.5 (3) MG/3ML Soln Commonly known as:  DUONEB Take 3 mLs by nebulization 2 (two) times daily.   ipratropium-albuterol 0.5-2.5 (3) MG/3ML Soln Commonly known as:  DUONEB Take 3 mLs by nebulization every 6 (six) hours as needed.   irbesartan 150 MG tablet Commonly known as:  AVAPRO Take 150 mg by mouth daily.   levothyroxine 25 MCG tablet Commonly known as:  SYNTHROID,  LEVOTHROID Take 25 mcg by mouth daily before breakfast.   lidocaine 5 % Commonly known as:  LIDODERM Place 1 patch onto the skin every 12 (twelve) hours. Remove & Discard patch within 12 hours or as directed by MD   metoprolol tartrate 25 MG tablet Commonly known as:  LOPRESSOR Take 12.5 mg by mouth 2 (two) times daily.   mirtazapine 15 MG tablet Commonly known as:  REMERON Take 15 mg by mouth at bedtime.   omeprazole 20 MG capsule Commonly known as:  PRILOSEC Take 1 capsule by mouth daily.     potassium chloride 10 MEQ CR capsule Commonly known as:  MICRO-K Take 10 mEq by mouth daily.   raloxifene 60 MG tablet Commonly known as:  EVISTA Take 1 tablet by mouth daily.   ranitidine 75 MG tablet Commonly known as:  ZANTAC Take 75 mg by mouth 2 (two) times daily.   risperiDONE 0.25 MG tablet Commonly known as:  RISPERDAL TAKE ONE TABLET EVERY DAY AT BEDTIME   SENNA-PLUS 8.6-50 MG tablet Generic drug:  senna-docusate Take 1 tablet by mouth 2 (two) times daily.   SYSTANE 0.4-0.3 % Soln Generic drug:  Polyethyl Glycol-Propyl Glycol Place 1 drop into both eyes as needed.   traMADol 50 MG tablet Commonly known as:  ULTRAM Take 1 tablet (50 mg total) by mouth 2 (two) times daily.   vitamin C 500 MG tablet Commonly known as:  ASCORBIC ACID Take 500 mg by mouth 2 (two) times daily.       Review of Systems  Constitutional: Negative for activity change, appetite change, chills, diaphoresis and fever.  HENT: Negative for congestion, sneezing, sore throat, trouble swallowing and voice change.   Respiratory: Negative for apnea, cough, choking, chest tightness, shortness of breath and wheezing.   Cardiovascular: Negative for chest pain, palpitations and leg swelling.  Gastrointestinal: Negative for abdominal distention, abdominal pain, constipation, diarrhea and nausea.  Genitourinary: Negative for difficulty urinating, dysuria, frequency and urgency.  Musculoskeletal:  Positive for arthralgias (typical arthritis). Negative for back pain, gait problem, myalgias, neck pain and neck stiffness.  Skin: Negative for color change, pallor, rash and wound.  Neurological: Positive for weakness. Negative for dizziness, tremors, syncope, speech difficulty, numbness and headaches.  Psychiatric/Behavioral: Negative for agitation and behavioral problems.  All other systems reviewed and are negative.   Immunization History  Administered Date(s) Administered  . Influenza Inj Mdck Quad Pf 06/16/2016  . Influenza,inj,Quad PF,6+ Mos 08/05/2015  . Influenza-Unspecified 07/08/2012, 07/14/2013, 07/13/2014  . PPD Test 09/12/2015, 09/26/2015  . Pneumococcal Polysaccharide-23 08/15/2013, 08/05/2015   Pertinent  Health Maintenance Due  Topic Date Due  . DEXA SCAN  11/28/1993  . PNA vac Low Risk Adult (2 of 2 - PCV13) 08/04/2016  . INFLUENZA VACCINE  04/22/2017   Fall Risk  05/05/2017 04/20/2017 09/23/2016 07/23/2016 03/24/2016  Falls in the past year? No No No No No  Comment - - - - -  Number falls in past yr: - - - - -  Injury with Fall? - - - - -  Comment - - - - -  Risk Factor Category  - - - - -  Risk for fall due to : - - - - -  Follow up - - - - -  Comment - - - - -   Functional Status Survey:    Vitals:   06/12/17 1418  BP: (!) 138/50  Pulse: 71  Resp: 18  Temp: 98.8 F (37.1 C)  SpO2: 97%  Weight: 180 lb 6.4 oz (81.8 kg)  Height: 5' 7" (1.702 m)   Body mass index is 28.25 kg/m. Physical Exam  Constitutional: She is oriented to person, place, and time. Vital signs are normal. She appears well-developed and well-nourished. She is active and cooperative. She does not appear ill. No distress.  HENT:  Head: Normocephalic and atraumatic.  Mouth/Throat: Uvula is midline, oropharynx is clear and moist and mucous membranes are normal. Mucous membranes are not pale, not dry and not cyanotic.  Eyes: Pupils are equal, round, and reactive to light. Conjunctivae, EOM  and   lids are normal.  Neck: Trachea normal, normal range of motion and full passive range of motion without pain. Neck supple. No JVD present. No tracheal deviation, no edema and no erythema present. No thyromegaly present.  Cardiovascular: Normal rate, regular rhythm, intact distal pulses and normal pulses.  Exam reveals no gallop, no distant heart sounds and no friction rub.   Murmur heard. Pulses:      Dorsalis pedis pulses are 2+ on the right side, and 2+ on the left side.  Pulmonary/Chest: Effort normal. No accessory muscle usage. No respiratory distress. She has no decreased breath sounds. She has no rhonchi. She has no rales. She exhibits no tenderness.  Abdominal: Normal appearance and bowel sounds are normal. She exhibits no distension and no ascites. There is no tenderness.  Musculoskeletal: Normal range of motion. She exhibits no edema or tenderness.       Cervical back: She exhibits pain and spasm.  Expected osteoarthritis, stiffness  Neurological: She is alert and oriented to person, place, and time. She has normal strength.  Skin: Skin is warm, dry and intact. No rash noted. She is not diaphoretic. No cyanosis or erythema. No pallor. Nails show no clubbing.  Psychiatric: She has a normal mood and affect. Her speech is normal and behavior is normal. Judgment and thought content normal. Cognition and memory are impaired. She exhibits abnormal recent memory.  Nursing note and vitals reviewed.   Labs reviewed:  Recent Labs  05/21/17 0532 05/26/17 0632 06/09/17 0453  NA 134* 136 138  K 4.4 4.4 3.4*  CL 103 106 106  CO2 20* 21* 23  GLUCOSE 63* 92 77  BUN 63* 79* 53*  CREATININE 2.71* 2.66* 2.18*  CALCIUM 8.8* 8.9 8.5*    Recent Labs  05/18/17 1500 06/09/17 0453  AST 48* 31  ALT 37 19  ALKPHOS 76 55  BILITOT 0.5 0.5  PROT 6.8 6.0*  ALBUMIN 2.7* 2.8*    Recent Labs  05/22/17 0510 05/26/17 0632 06/09/17 0453  WBC 10.5 7.2 8.8  NEUTROABS 8.2* 5.0 5.2  HGB 7.8*  8.4* 9.0*  HCT 24.5* 26.3* 28.0*  MCV 87.2 87.6 90.8  PLT 278 323 200   Lab Results  Component Value Date   TSH 3.669 05/18/2017   Lab Results  Component Value Date   HGBA1C 5.6 12/10/2015   Lab Results  Component Value Date   CHOL 174 12/10/2015   HDL 44 12/10/2015   LDLCALC 107 (H) 12/10/2015   TRIG 115 12/10/2015   CHOLHDL 4.0 12/10/2015    Significant Diagnostic Results in last 30 days:  Dg Chest 1 View  Result Date: 05/14/2017 CLINICAL DATA:  Worsening productive cough EXAM: CHEST 1 VIEW COMPARISON:  05/13/2017 FINDINGS: The heart size and mediastinal contours are within normal limits. Both lungs are clear. The visualized skeletal structures are unremarkable. IMPRESSION: No active disease. Electronically Signed   By: Hetal  Patel   On: 05/14/2017 12:16   Mr Lumbar Spine Wo Contrast  Result Date: 06/12/2017 CLINICAL DATA:  Initial evaluation for acute left leg weakness, decreased rectal tone EXAM: MRI LUMBAR SPINE WITHOUT CONTRAST TECHNIQUE: Multiplanar, multisequence MR imaging of the lumbar spine was performed. No intravenous contrast was administered. COMPARISON:  Prior CT from 05/04/2017. FINDINGS: Segmentation: Normal segmentation. Lowest well-formed disc labeled the L5-S1 level. Same numbering system is employed as on previous exam. Alignment: 5 mm anterolisthesis of L4 on L5, stable. Trace retrolisthesis of L2 on L3. Mild scoliosis. Alignment is stable from previous. Vertebrae: Acute   to subacute appearing L1 compression fracture again seen. Associated stable height loss of up approximately 50% with trace 2-3 mm bony retropulsion. This is overall not significantly changed from previous. Vertebral body heights otherwise maintained. No evidence for interval or additional acute fracture. Bone marrow signal intensity within normal limits. No discrete or worrisome osseous lesions. Mild reactive edema about the right L3-4 and L4-5 facets due to facet degeneration. Conus medullaris:  Extends to the L1 level and appears normal. No evidence for cord compression. Paraspinal and other soft tissues: Paraspinous soft tissues demonstrate no acute abnormality. Visualized kidneys are atrophic in appearance. Remainder the visualized visceral structures otherwise unremarkable. Disc levels: T11-12: Seen only on sagittal projection. Diffuse disc bulge with disc desiccation. No stenosis. T12-L1: 3 mm bony retropulsion of the posterior aspect of the L1 vertebral body related to the L1 compression fracture. Flattening of the ventral thecal sac with resultant mild spinal stenosis. No cord impingement or neural compression. Foramina remain widely patent. L1-2: Mild diffuse disc bulge with disc desiccation. Mild 2 mm posterior osseous protrusion related to the compression fracture. Mild spinal stenosis without neural compression. There is a superimposed right foraminal disc extrusion with slight superior migration, impinging upon the exiting right L1 nerve root (Series 5, image 6 on axial sequence, series 3, image 4 on sagittal sequence). Resultant severe right foraminal stenosis. Superimposed mild facet hypertrophy. Mild canal narrowing related to disc bulge in compression fracture. No significant left foraminal encroachment. L2-3: Trace retrolisthesis. Diffuse disc bulge with disc desiccation and intervertebral disc space narrowing. Disc bulging eccentric to the left. There is a subtle superimposed tiny left subarticular disc extrusion with slight inferior migration (series 2, image 8). This likely contacts the descending left L3 nerve root in the left lateral recess (series 5, image 14). Superimposed mild facet ligamentum flavum hypertrophy. Resultant mild canal with moderate left lateral recess stenosis. Mild to moderate left foraminal narrowing. Either the left L2 or L3 nerve roots could be affected. L3-4: Diffuse disc bulge with disc desiccation and intervertebral disc space narrowing. Disc bulging eccentric  to the left, closely approximating and potentially irritating the exiting left L3 nerve root without frank impingement (series 3, image 11). Moderate facet ligamentum flavum hypertrophy. Resultant moderate canal stenosis, stable. Mild left foraminal narrowing. L4-5: 5 mm anterolisthesis. Diffuse disc bulge with disc desiccation and intervertebral disc space narrowing. Moderate facet arthrosis. No significant stenosis. L5-S1: Chronic diffuse degenerative intervertebral disc space narrowing with diffuse disc bulge and disc desiccation. Associated reactive endplate changes with marginal endplate osteophytosis. Superimposed tiny central disc protrusion (series 5, image 29). Mild facet ligamentum flavum hypertrophy. Mild bilateral subarticular stenosis without impingement. No significant foraminal encroachment. IMPRESSION: 1. Stable appearance of acute to subacute L1 compression fracture with approximately 50% height loss and 3 mm bony retropulsion. Resultant mild spinal stenosis without cord compression. 2. Right foraminal disc extrusion at L1-2, impinging upon the exiting right L1 nerve root. 3. Left eccentric disc bulge with superimposed tiny left subarticular disc extrusion at L2-3 as above. Either the left L2 or L3 nerve roots could be affected. 4. Left eccentric disc bulge at L3-4, closely approximating and potentially irritating the exiting left L3 nerve root without frank impingement. 5. 5 mm spondylolysis at L4-5 with associated disc bulge and moderate facet arthrosis without significant stenosis. 6. Stable degenerative disc disease at L5-S1 with resultant mild bilateral subarticular stenosis, slightly worse on the left. Electronically Signed   By: Jeannine Boga M.D.   On: 06/12/2017 14:06  Dg Foot Complete Left  Result Date: 05/15/2017 CLINICAL DATA:  Second through fifth toe pain. EXAM: LEFT FOOT - COMPLETE 3+ VIEW COMPARISON:  August 06, 2014. FINDINGS: There is no evidence of fracture or  dislocation. Healed fracture of the base of the fifth metatarsal. Mild midfoot degenerative changes. Small plantar and Achilles enthesophytes, unchanged. Osteopenia. Vascular calcifications. Soft tissues are unremarkable. IMPRESSION: 1.  No acute osseous abnormality. Electronically Signed   By: Titus Dubin M.D.   On: 05/15/2017 09:34    Assessment/Plan 1. Muscle spasm  Stat MRI of the lumbar spine r/o Cauda Equina Syndrome  Labs- r/o electrolyte imbalance  Ambulate with gait belt in place  Family/ staff Communication:   Total Time:  Documentation:  Face to Face:  Family/Phone:   Labs/tests ordered:  CBC, Met C  Medication list reviewed and assessed for continued appropriateness.  Vikki Ports, NP-C Geriatrics [facility] [phone], [place] [address] Cell Phone (Mon-Fri 8am-5pm):  [phone] On Call:  [phone] & follow prompts after 5pm & weekends Office Phone:  [phone] Office Fax:  [phone]

## 2017-06-16 ENCOUNTER — Other Ambulatory Visit
Admission: RE | Admit: 2017-06-16 | Discharge: 2017-06-16 | Disposition: A | Discharge status: Home / Self Care | Payer: Medicare Other | Source: Ambulatory Visit | Attending: Internal Medicine | Admitting: Internal Medicine

## 2017-06-16 ENCOUNTER — Other Ambulatory Visit: Payer: Self-pay

## 2017-06-16 DIAGNOSIS — R8290 Unspecified abnormal findings in urine: Secondary | ICD-10-CM | POA: Diagnosis present

## 2017-06-16 DIAGNOSIS — R309 Painful micturition, unspecified: Secondary | ICD-10-CM | POA: Insufficient documentation

## 2017-06-16 LAB — URINALYSIS, COMPLETE (UACMP) WITH MICROSCOPIC
BILIRUBIN URINE: NEGATIVE
GLUCOSE, UA: NEGATIVE mg/dL
HGB URINE DIPSTICK: NEGATIVE
Ketones, ur: NEGATIVE mg/dL
Nitrite: NEGATIVE
Protein, ur: NEGATIVE mg/dL
SPECIFIC GRAVITY, URINE: 1.008 (ref 1.005–1.030)
Squamous Epithelial / LPF: NONE SEEN
pH: 5 (ref 5.0–8.0)

## 2017-06-16 MED ORDER — TRAMADOL HCL 50 MG PO TABS: 50.0000 mg | ORAL_TABLET | Freq: Two times a day (BID) | ORAL | 1 refills | Status: DC

## 2017-06-16 NOTE — Telephone Encounter (Signed)
Rx sent to [facility] phone : [phone] , fax : [phone]  

## 2017-06-18 LAB — URINE CULTURE: Culture: >=100000 — AB

## 2017-06-19 ENCOUNTER — Non-Acute Institutional Stay (SKILLED_NURSING_FACILITY): Payer: Medicare Other | Admitting: Gerontology

## 2017-06-19 ENCOUNTER — Encounter: Payer: Self-pay | Admitting: Physical Medicine & Rehabilitation

## 2017-06-19 ENCOUNTER — Ambulatory Visit: Payer: Medicare Other | Admitting: Physical Medicine & Rehabilitation

## 2017-06-19 DIAGNOSIS — R062 Wheezing: Secondary | ICD-10-CM

## 2017-06-19 DIAGNOSIS — M48062 Spinal stenosis, lumbar region with neurogenic claudication: Secondary | ICD-10-CM

## 2017-06-19 DIAGNOSIS — E559 Vitamin D deficiency, unspecified: Secondary | ICD-10-CM

## 2017-06-19 DIAGNOSIS — N179 Acute kidney failure, unspecified: Secondary | ICD-10-CM

## 2017-06-19 DIAGNOSIS — E46 Unspecified protein-calorie malnutrition: Secondary | ICD-10-CM | POA: Diagnosis not present

## 2017-06-19 DIAGNOSIS — R531 Weakness: Secondary | ICD-10-CM | POA: Diagnosis not present

## 2017-06-19 DIAGNOSIS — M62838 Other muscle spasm: Secondary | ICD-10-CM

## 2017-06-19 DIAGNOSIS — N189 Chronic kidney disease, unspecified: Secondary | ICD-10-CM | POA: Diagnosis not present

## 2017-06-22 ENCOUNTER — Encounter: Admission: RE | Admit: 2017-06-22 | Payer: Medicare Other | Source: Ambulatory Visit | Admitting: Internal Medicine

## 2017-06-22 ENCOUNTER — Other Ambulatory Visit: Payer: Self-pay | Admitting: Physical Medicine & Rehabilitation

## 2017-06-22 NOTE — Telephone Encounter (Signed)
Patient under nursing home care

## 2017-06-22 NOTE — Progress Notes (Signed)
Please cancel this visit pt was not seen

## 2017-06-23 ENCOUNTER — Encounter: Payer: Self-pay | Admitting: Physical Medicine & Rehabilitation

## 2017-06-23 ENCOUNTER — Encounter: Payer: Medicare Other | Attending: Physical Medicine & Rehabilitation | Admitting: Physical Medicine & Rehabilitation

## 2017-06-23 ENCOUNTER — Other Ambulatory Visit: Payer: Self-pay

## 2017-06-23 VITALS — BP 114/70 | HR 61 | Resp 14

## 2017-06-23 DIAGNOSIS — M5416 Radiculopathy, lumbar region: Secondary | ICD-10-CM | POA: Diagnosis not present

## 2017-06-23 DIAGNOSIS — M5126 Other intervertebral disc displacement, lumbar region: Secondary | ICD-10-CM | POA: Diagnosis not present

## 2017-06-23 DIAGNOSIS — I5022 Chronic systolic (congestive) heart failure: Secondary | ICD-10-CM | POA: Insufficient documentation

## 2017-06-23 DIAGNOSIS — R269 Unspecified abnormalities of gait and mobility: Secondary | ICD-10-CM | POA: Diagnosis present

## 2017-06-23 DIAGNOSIS — R251 Tremor, unspecified: Secondary | ICD-10-CM | POA: Insufficient documentation

## 2017-06-23 DIAGNOSIS — M5136 Other intervertebral disc degeneration, lumbar region: Secondary | ICD-10-CM | POA: Diagnosis not present

## 2017-06-23 DIAGNOSIS — M1990 Unspecified osteoarthritis, unspecified site: Secondary | ICD-10-CM | POA: Diagnosis not present

## 2017-06-23 DIAGNOSIS — G4700 Insomnia, unspecified: Secondary | ICD-10-CM | POA: Diagnosis not present

## 2017-06-23 DIAGNOSIS — M48061 Spinal stenosis, lumbar region without neurogenic claudication: Secondary | ICD-10-CM | POA: Insufficient documentation

## 2017-06-23 DIAGNOSIS — I110 Hypertensive heart disease with heart failure: Secondary | ICD-10-CM | POA: Diagnosis not present

## 2017-06-23 DIAGNOSIS — F329 Major depressive disorder, single episode, unspecified: Secondary | ICD-10-CM | POA: Insufficient documentation

## 2017-06-23 DIAGNOSIS — M47816 Spondylosis without myelopathy or radiculopathy, lumbar region: Secondary | ICD-10-CM | POA: Diagnosis not present

## 2017-06-23 MED ORDER — PROPRANOLOL HCL 10 MG PO TABS: 10.0000 mg | ORAL_TABLET | Freq: Three times a day (TID) | ORAL | Status: DC

## 2017-06-23 NOTE — Progress Notes (Signed)
Subjective:    Patient ID: Lisa [name], female    DOB: 1927-09-16, 83 y.o.   MRN: [id number]  HPI  Bich is here in follow up of her remote TBI and chronic pain syndrome. She had been developing increasing low back pain over the summer. I had sent her for MBB initially which did not provide a lot of relief. She went back for repeat MBB's at L3-4 per Dr. Wynn Banker in late August which seemed to provide some relief. She still has some pain in the left low back without substantial radiation.   She was admitted to the hospital and eventually SNF with a UTI and dehydration. She has been working on strength and balance with therapy and has noticed some improvement.   She has continued to display, albeit improved per son, a tremor. It involves all 4 limbs but may be worse in the LE's especially when she's up. Another MRI was ordered by NP at SNF for concern over "cauda equina syndrome". The results were as follows:  1. Stable appearance of acute to subacute L1 compression fracture with approximately 50% height loss and 3 mm bony retropulsion. Resultant mild spinal stenosis without cord compression. 2. Right foraminal disc extrusion at L1-2, impinging upon the exiting right L1 nerve root. 3. Left eccentric disc bulge with superimposed tiny left subarticular disc extrusion at L2-3 as above. Either the left L2 or L3 nerve roots could be affected. 4. Left eccentric disc bulge at L3-4, closely approximating and potentially irritating the exiting left L3 nerve root without frank impingement. 5. 5 mm spondylolysis at L4-5 with associated disc bulge and moderate facet arthrosis without significant stenosis. 6. Stable degenerative disc disease at L5-S1 with resultant mild bilateral subarticular stenosis, slightly worse on the left.  Ericia denies numbness in her legs and feels that in general they're getting stronger. Her bowels and bladder have been working fairly normally    Pain Inventory Average  Pain 5 Pain Right Now 4 My pain is dull  In the last 24 hours, has pain interfered with the following? General activity 1 Relation with others 2 Enjoyment of life 1 What TIME of day is your pain at its worst? daytime Sleep (in general) Good  Pain is worse with: unsure Pain improves with: medication Relief from Meds: 4  Mobility walk with assistance use a walker ability to climb steps?  no do you drive?  no use a wheelchair transfers alone  Function retired  Neuro/Psych tremor trouble walking  Prior Studies Any changes since last visit?  no  Physicians involved in your care Any changes since last visit?  no   Family History  Problem Relation Age of Onset  . COPD Mother   . High blood pressure Mother   . Lung disease Mother   . COPD Father   . Heart disease Father   . High blood pressure Father   . Heart attack Father   . Skin cancer Brother    Social History   Social History  . Marital status: Widowed    Spouse name: N/A  . Number of children: 1  . Years of education: 74   Occupational History  . retired    Social History Main Topics  . Smoking status: Never Smoker  . Smokeless tobacco: Never Used  . Alcohol use 0.0 oz/week     Comment: occasional wine  . Drug use: No  . Sexual activity: Not Asked   Other Topics Concern  . None  Social History Narrative   Admitted to [facility] 05/15/17   Widowed   Never smoker   Occasionally drinks wine   DNR   Past Surgical History:  Procedure Laterality Date  . BACK SURGERY N/A   . BREAST LUMPECTOMY N/A   . FRACTURE SURGERY  08/25/2013   ORIF left distal radius fracture.  . WISDOM TOOTH EXTRACTION     Past Medical History:  Diagnosis Date  . Allergy   . Allergy to shellfish    causes swelling  . Brain bleed (HCC)   . Chronic back pain   . Chronic systolic CHF (congestive heart failure) (HCC)   . Depression    unspecified  . Foot fracture    bilateral  . History of abnormal  mammogram    Status post breast biopsy x 2, which were negative.   [name] History of chicken pox   . History of sinus tachycardia 11/1997   With dyspnea on exertion. Workup included a VQ scan which was normal, and ultrasound of her lower extremities, which was normal with no evidence of DVT.  [name] Hypertension   . Nocturnal leg cramps   . Osteoarthritis   . Osteoporosis   . Pelvis fracture (HCC)   . Peptic ulcer disease   . Renal disorder   . Spinal stenosis   . V-tach (HCC)   . Wrist fracture, right    BP 114/70 (BP Location: Right Arm, Patient Position: Sitting, Cuff Size: Normal)   Pulse 61   Resp 14   SpO2 93%   Opioid Risk Score:   Fall Risk Score:  `1  Depression screen PHQ 2/9  Depression screen [facility] 2/9 11/14/2016 10/22/2015 07/24/2015 07/16/2015 06/26/2015 05/22/2015 04/23/2015  Decreased Interest 1 0 0 0 0 0 0  Down, Depressed, Hopeless 1 0 0 0 0 0 0  PHQ - 2 Score 2 0 0 0 0 0 0  Altered sleeping 2 0 - - - - -  Tired, decreased energy 1 0 - - - - -  Change in appetite 0 0 - - - - -  Feeling bad or failure about yourself  0 0 - - - - -  Trouble concentrating 0 0 - - - - -  Moving slowly or fidgety/restless 0 0 - - - - -  Suicidal thoughts 0 0 - - - - -  PHQ-9 Score 5 0 - - - - -  Some recent data might be hidden    Review of Systems  Constitutional: Negative.   HENT: Negative.   Eyes: Negative.   Respiratory: Negative.   Cardiovascular: Negative.   Gastrointestinal: Negative.   Endocrine: Negative.   Genitourinary: Negative.   Musculoskeletal: Positive for back pain and gait problem.  Skin: Negative.   Allergic/Immunologic: Negative.   Neurological: Positive for tremors.  Hematological: Negative.   Psychiatric/Behavioral: Negative.        Objective:   Physical Exam  HENT:  Head: NCAT.  Mouth/Throat: oral membranes moist Eyes:PERRL  Neck: normal ROM  Cardiovascular: RRR Respiratory: CTA Bilaterally without wheezes or rales. Normal effort  GI: soft  nt Musculoskeletal: Low back less tender. Still sore along left L4-5 area. More tender with flexion than extension. .  Neurological: HOH, alert and oriented. Reasonable insight and awareness.  Strength 4-5/5 in UE and 4-/5 prox LE to 4+/5 distally. She does demonstrate a variable frequency tremor in both arms and legs worse with intentional movements. Do not see at rest. No rigidity noted.  Sensation  intact to light touch in bilateral upper and lower limbs--stable.  Skin: intact without breakdown  Psych: pleasant and appropriate.    Assessment & Plan:  Medical Problem List and Plan: 1. 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  -pt near cognitive baseline  -recommend ongoing supervision for safety though 3. Chronic back pain/Pain Management: lumbar post lami syndrome. Last [facility] 07/16/15 by Dr. Metta Clines.  - again, she has multiple potential pain generators as evidenced by this most recent CT.   -she did respond to the L3-4 MBB's however.   -consider ESI for further pain control -In the meantime, I would like therapy to address basic posture/mechanical/ROM with her. strengthening also as appropriate -limit narcs/neurosedating meds given her age/poor tolerance 4. Tremor: likely multifactorial. Old TBI likely playing a role as well  -continue strengthening efforts with PT  -trial of low dose propranolol  titrating up to TID   5. Insomnia, ?mild sundowning? - low dose risperdal for sleep/mood, 0.25mg  qhs #30. Doubt it is causing tremors 6. Right shoulder pain: likely RTC tendonitis. Somewhat improved 7. Depression: celexa 8. Recent UTI/urosepsi: reviewed signs and symptoms of infection. Needs to continue drinking enough water also.   Follow up with me in about 5 weeks. Thirty minutes of face to face patient care time were spent  during this visit. All questions were encouraged and answered. Greater than 50% of time during this encounter was spent counseling patient/family in regard to etiologies of tremors, reviewing recent MRI, providing and explaining HEP, discussing plan moving forward with son.

## 2017-06-23 NOTE — Patient Instructions (Signed)
DRINK PLENTY OF FLUIDS. KEEP AN EYE ON YOUR URINE FOR SMELL, COLOR, CLARITY, BURNING, CHANGES IN EMPTYING HABITS   PROPRANOLOL:  NIGHTLY FOR 3 DAYS THEN TWICE DAILY FOR 5 DAYS THEN THREE X DAILY THEREAFTER. STOP IF IT'S MAKING YOU DIZZY OR DROWSY.    HAVE YOUR HOME THERAPIST ADDRESS YOUR LOW BACK POSTURE, RANGE OF MOTION, STRENGTH AS PART OF YOUR HOME PROGRAM.

## 2017-06-25 ENCOUNTER — Ambulatory Visit: Payer: Medicare Other

## 2017-06-26 ENCOUNTER — Encounter: Payer: Self-pay | Admitting: Gerontology

## 2017-06-26 NOTE — Progress Notes (Signed)
Location:   [facility] Room Number: 203B Place of Service:  SNF [phone])  Provider: Lorenso Quarry, NP-C  PCP: Kandyce Rud, MD Patient Care Team: Kandyce Rud, MD as PCP - General (Family Medicine)  Extended Emergency Contact Information Primary Emergency Contact: Grier Rocher [place] of [place] Mobile Phone: [phone] Relation: Son Secondary Emergency Contact: Verdene Lennert [place] of [place] Home Phone: [phone] Relation: Friend  Code Status: DNR Goals of care:  Advanced Directive information Advanced Directives 06/23/2017  Does Patient Have a Medical Advance Directive? Yes  Type of Advance Directive Out of facility DNR (pink MOST or yellow form)  Does patient want to make changes to medical advance directive? -  Copy of Healthcare Power of Attorney in Chart? -     Allergies  Allergen Reactions  . Ambien [Zolpidem Tartrate] Other (See Comments)    Reaction:  Hallucinations   . Daypro [Oxaprozin] Other (See Comments)    GI upset  . Oxycodone Nausea And Vomiting  . Shellfish Allergy Swelling and Other (See Comments)    Pts mouth and face swells.   . Ace Inhibitors Cough  . Norvasc [Amlodipine Besylate] Palpitations    Chief Complaint  Patient presents with  . Discharge Note    Discharged From SNF    HPI:  83 y.o. female      Past Medical History:  Diagnosis Date  . Allergy   . Allergy to shellfish    causes swelling  . Brain bleed (HCC)   . Chronic back pain   . Chronic systolic CHF (congestive heart failure) (HCC)   . Depression    unspecified  . Foot fracture    bilateral  . History of abnormal mammogram    Status post breast biopsy x 2, which were negative.   [name] History of chicken pox   . History of sinus tachycardia 11/1997   With dyspnea on exertion. Workup included a VQ scan which was normal, and ultrasound of her lower extremities, which was normal with no evidence of DVT.  [name] Hypertension   .  Nocturnal leg cramps   . Osteoarthritis   . Osteoporosis   . Pelvis fracture (HCC)   . Peptic ulcer disease   . Renal disorder   . Spinal stenosis   . V-tach (HCC)   . Wrist fracture, right     Past Surgical History:  Procedure Laterality Date  . BACK SURGERY N/A   . BREAST LUMPECTOMY N/A   . FRACTURE SURGERY  08/25/2013   ORIF left distal radius fracture.  . WISDOM TOOTH EXTRACTION        reports that she has never smoked. She has never used smokeless tobacco. She reports that she drinks alcohol. She reports that she does not use drugs. Social History   Social History  . Marital status: Widowed    Spouse name: N/A  . Number of children: 1  . Years of education: 79   Occupational History  . retired    Social History Main Topics  . Smoking status: Never Smoker  . Smokeless tobacco: Never Used  . Alcohol use 0.0 oz/week     Comment: occasional wine  . Drug use: No  . Sexual activity: Not on file   Other Topics Concern  . Not on file   Social History Narrative   Admitted to [facility] 05/15/17 - 06/19/17   Widowed   Never smoker   Occasionally drinks wine   DNR   Functional  Status Survey:    Allergies  Allergen Reactions  . Ambien [Zolpidem Tartrate] Other (See Comments)    Reaction:  Hallucinations   . Daypro [Oxaprozin] Other (See Comments)    GI upset  . Oxycodone Nausea And Vomiting  . Shellfish Allergy Swelling and Other (See Comments)    Pts mouth and face swells.   . Ace Inhibitors Cough  . Norvasc [Amlodipine Besylate] Palpitations    Pertinent  Health Maintenance Due  Topic Date Due  . DEXA SCAN  11/28/1993  . PNA vac Low Risk Adult (2 of 2 - PCV13) 08/04/2016  . INFLUENZA VACCINE  04/22/2017    Medications: Allergies as of 06/19/2017      Reactions   Ambien [zolpidem Tartrate] Other (See Comments)   Reaction:  Hallucinations    Daypro [oxaprozin] Other (See Comments)   GI upset   Oxycodone Nausea And Vomiting   Shellfish  Allergy Swelling, Other (See Comments)   Pts mouth and face swells.    Ace Inhibitors Cough   Norvasc [amlodipine Besylate] Palpitations      Medication List       Accurate as of 06/19/17 11:59 PM. Always use your most recent med list.          acetaminophen 325 MG tablet Commonly known as:  TYLENOL Take 650 mg by mouth every 6 (six) hours as needed.   alum & mag hydroxide-simeth 400-400-40 MG/5ML suspension Commonly known as:  MAALOX PLUS Take 30 mLs by mouth every 4 (four) hours as needed for indigestion.   amiodarone 100 MG tablet Commonly known as:  PACERONE Take 1 tablet (100 mg total) by mouth daily.   B-12 1000 MCG Tabs Take 1 tablet by mouth daily.   baclofen 10 MG tablet Commonly known as:  LIORESAL Take 0.5 tablets (5 mg total) by mouth every 8 (eight) hours as needed for muscle spasms.   benzonatate 100 MG capsule Commonly known as:  TESSALON Take 1 capsule (100 mg total) by mouth 3 (three) times daily as needed for cough.   camphor-menthol lotion Commonly known as:  SARNA Apply thin film to areas of itching 4 times daily as needed. Ok for family to apply and ok to leave at bedside.   capsaicin 0.025 % cream Commonly known as:  ZOSTRIX Massage cream onto the posterior neck and shoulders topically 3 times daily for muscle cramps   Cholecalciferol 2000 units Caps Take 2 capsules by mouth daily. 4000 units = 2 caps   citalopram 20 MG tablet Commonly known as:  CELEXA Take 20 mg by mouth daily.   DERMACLOUD Crea Apply liberal amount topically to area of skin irritation as needed. Ok to leave at bedside.   ELIQUIS 2.5 MG Tabs tablet Generic drug:  apixaban Take 2.5 mg by mouth 2 (two) times daily.   feeding supplement (NEPRO CARB STEADY) Liqd Take 1,000 mLs by mouth 2 (two) times daily between meals.   feeding supplement (PRO-STAT SUGAR FREE 64) Liqd Take 30 mLs by mouth 2 (two) times daily between meals.   ferrous sulfate 325 (65 FE) MG EC  tablet Take 325 mg by mouth 2 (two) times daily.   furosemide 20 MG tablet Commonly known as:  LASIX Take 20 mg by mouth daily.   gabapentin 100 MG capsule Commonly known as:  NEURONTIN Take 1 capsule (100 mg total) by mouth 3 (three) times daily. Hold for excessive sleepiness (sedation)   ipratropium-albuterol 0.5-2.5 (3) MG/3ML Soln Commonly known as:  DUONEB Take  3 mLs by nebulization 2 (two) times daily.   ipratropium-albuterol 0.5-2.5 (3) MG/3ML Soln Commonly known as:  DUONEB Take 3 mLs by nebulization every 6 (six) hours as needed.   irbesartan 150 MG tablet Commonly known as:  AVAPRO Take 150 mg by mouth daily.   levothyroxine 25 MCG tablet Commonly known as:  SYNTHROID, LEVOTHROID Take 25 mcg by mouth daily before breakfast.   lidocaine 5 % Commonly known as:  LIDODERM Place 1 patch onto the skin every 12 (twelve) hours. Remove & Discard patch within 12 hours or as directed by MD   metoprolol tartrate 25 MG tablet Commonly known as:  LOPRESSOR Take 12.5 mg by mouth 2 (two) times daily.   mirtazapine 15 MG tablet Commonly known as:  REMERON Take 15 mg by mouth at bedtime.   omeprazole 20 MG capsule Commonly known as:  PRILOSEC Take 1 capsule by mouth daily.   potassium chloride 10 MEQ CR capsule Commonly known as:  MICRO-K Take 10 mEq by mouth daily.   raloxifene 60 MG tablet Commonly known as:  EVISTA Take 1 tablet by mouth daily.   ranitidine 75 MG tablet Commonly known as:  ZANTAC Take 75 mg by mouth 2 (two) times daily.   risperiDONE 0.25 MG tablet Commonly known as:  RISPERDAL TAKE ONE TABLET EVERY DAY AT BEDTIME   SENNA-PLUS 8.6-50 MG tablet Generic drug:  senna-docusate Take 1 tablet by mouth 2 (two) times daily.   SYSTANE 0.4-0.3 % Soln Generic drug:  Polyethyl Glycol-Propyl Glycol Place 1 drop into both eyes as needed.   traMADol 50 MG tablet Commonly known as:  ULTRAM Take 1 tablet (50 mg total) by mouth 2 (two) times daily.    vitamin C 500 MG tablet Commonly known as:  ASCORBIC ACID Take 500 mg by mouth 2 (two) times daily.       Review of Systems  Constitutional: Negative for activity change, appetite change, chills, diaphoresis and fever.  HENT: Negative for congestion, sneezing, sore throat, trouble swallowing and voice change.   Respiratory: Negative for apnea, cough, choking, chest tightness, shortness of breath and wheezing.   Cardiovascular: Negative for chest pain, palpitations and leg swelling.  Gastrointestinal: Negative for abdominal distention, abdominal pain, constipation, diarrhea and nausea.  Genitourinary: Negative for difficulty urinating, dysuria, frequency and urgency.  Musculoskeletal: Positive for arthralgias (typical arthritis). Negative for back pain, gait problem, myalgias, neck pain and neck stiffness.  Skin: Negative for color change, pallor, rash and wound.  Neurological: Positive for weakness. Negative for dizziness, tremors, syncope, speech difficulty, numbness and headaches.  Psychiatric/Behavioral: Negative for agitation and behavioral problems.  All other systems reviewed and are negative.   Vitals:   06/19/17 1232  BP: (!) 121/40  Pulse: 71  Resp: 18  Temp: 98.2 F (36.8 C)  SpO2: 96%  Weight: 183 lb (83 kg)  Height:  (1.702 m)   Body mass index is 28.66 kg/m. Physical Exam  Constitutional: She is oriented to person, place, and time. Vital signs are normal. She appears well-developed and well-nourished. She is active and cooperative. She does not appear ill. No distress.  HENT:  Head: Normocephalic and atraumatic.  Mouth/Throat: Uvula is midline, oropharynx is clear and moist and mucous membranes are normal. Mucous membranes are not pale, not dry and not cyanotic.  Eyes: Pupils are equal, round, and reactive to light. Conjunctivae, EOM and lids are normal.  Neck: Trachea normal, normal range of motion and full passive range of motion without pain.  Neck  supple. No JVD present. No tracheal deviation, no edema and no erythema present. No thyromegaly present.  Cardiovascular: Normal rate, regular rhythm, intact distal pulses and normal pulses.  Exam reveals no gallop, no distant heart sounds and no friction rub.   Murmur heard. Pulses:      Dorsalis pedis pulses are 2+ on the right side, and 2+ on the left side.  Pulmonary/Chest: Effort normal. No accessory muscle usage. No respiratory distress. She has no decreased breath sounds. She has no rhonchi. She has no rales. She exhibits no tenderness.  Abdominal: Normal appearance and bowel sounds are normal. She exhibits no distension and no ascites. There is no tenderness.  Musculoskeletal: Normal range of motion. She exhibits no edema or tenderness.       Cervical back: She exhibits pain and spasm.  Expected osteoarthritis, stiffness  Neurological: She is alert and oriented to person, place, and time. She has normal strength.  Skin: Skin is warm, dry and intact. No rash noted. She is not diaphoretic. No cyanosis or erythema. No pallor. Nails show no clubbing.  Psychiatric: She has a normal mood and affect. Her speech is normal and behavior is normal. Judgment and thought content normal. Cognition and memory are impaired. She exhibits abnormal recent memory.  Nursing note and vitals reviewed.   Labs reviewed: Basic Metabolic Panel:  Recent Labs  16/10/96 0632 06/09/17 0453 06/12/17 1621  NA 136 138 134*  K 4.4 3.4* 3.8  CL 106 106 99*  CO2 21* 23 24  GLUCOSE 92 77 114*  BUN 79* 53* 58*  CREATININE 2.66* 2.18* 2.36*  CALCIUM 8.9 8.5* 9.0   Liver Function Tests:  Recent Labs  05/18/17 1500 06/09/17 0453 06/12/17 1621  AST 48* 31 35  ALT 37 19 22  ALKPHOS 76 55 68  BILITOT 0.5 0.5 0.3  PROT 6.8 6.0* 7.3  ALBUMIN 2.7* 2.8* 3.4*   No results for input(s): LIPASE, AMYLASE in the last 8760 hours. No results for input(s): AMMONIA in the last 8760 hours. CBC:  Recent Labs   05/26/17 0632 06/09/17 0453 06/12/17 1621  WBC 7.2 8.8 9.5  NEUTROABS 5.0 5.2 6.6*  HGB 8.4* 9.0* 9.9*  HCT 26.3* 28.0* 30.2*  MCV 87.6 90.8 93.4  PLT 323 200 220   Cardiac Enzymes:  Recent Labs  05/13/17 1736 05/13/17 2139 05/14/17 0119  TROPONINI <0.03 <0.03 <0.03   BNP: Invalid input(s): POCBNP CBG: No results for input(s): GLUCAP in the last 8760 hours.  Procedures and Imaging Studies During Stay: Mr Lumbar Spine Wo Contrast  Result Date: 06/12/2017 CLINICAL DATA:  Initial evaluation for acute left leg weakness, decreased rectal tone EXAM: MRI LUMBAR SPINE WITHOUT CONTRAST TECHNIQUE: Multiplanar, multisequence MR imaging of the lumbar spine was performed. No intravenous contrast was administered. COMPARISON:  Prior CT from 05/04/2017. FINDINGS: Segmentation: Normal segmentation. Lowest well-formed disc labeled the L5-S1 level. Same numbering system is employed as on previous exam. Alignment: 5 mm anterolisthesis of L4 on L5, stable. Trace retrolisthesis of L2 on L3. Mild scoliosis. Alignment is stable from previous. Vertebrae: Acute to subacute appearing L1 compression fracture again seen. Associated stable height loss of up approximately 50% with trace 2-3 mm bony retropulsion. This is overall not significantly changed from previous. Vertebral body heights otherwise maintained. No evidence for interval or additional acute fracture. Bone marrow signal intensity within normal limits. No discrete or worrisome osseous lesions. Mild reactive edema about the right L3-4 and L4-5 facets due to facet degeneration. Conus  medullaris: Extends to the L1 level and appears normal. No evidence for cord compression. Paraspinal and other soft tissues: Paraspinous soft tissues demonstrate no acute abnormality. Visualized kidneys are atrophic in appearance. Remainder the visualized visceral structures otherwise unremarkable. Disc levels: T11-12: Seen only on sagittal projection. Diffuse disc bulge with  disc desiccation. No stenosis. T12-L1: 3 mm bony retropulsion of the posterior aspect of the L1 vertebral body related to the L1 compression fracture. Flattening of the ventral thecal sac with resultant mild spinal stenosis. No cord impingement or neural compression. Foramina remain widely patent. L1-2: Mild diffuse disc bulge with disc desiccation. Mild 2 mm posterior osseous protrusion related to the compression fracture. Mild spinal stenosis without neural compression. There is a superimposed right foraminal disc extrusion with slight superior migration, impinging upon the exiting right L1 nerve root (Series 5, image 6 on axial sequence, series 3, image 4 on sagittal sequence). Resultant severe right foraminal stenosis. Superimposed mild facet hypertrophy. Mild canal narrowing related to disc bulge in compression fracture. No significant left foraminal encroachment. L2-3: Trace retrolisthesis. Diffuse disc bulge with disc desiccation and intervertebral disc space narrowing. Disc bulging eccentric to the left. There is a subtle superimposed tiny left subarticular disc extrusion with slight inferior migration (series 2, image 8). This likely contacts the descending left L3 nerve root in the left lateral recess (series 5, image 14). Superimposed mild facet ligamentum flavum hypertrophy. Resultant mild canal with moderate left lateral recess stenosis. Mild to moderate left foraminal narrowing. Either the left L2 or L3 nerve roots could be affected. L3-4: Diffuse disc bulge with disc desiccation and intervertebral disc space narrowing. Disc bulging eccentric to the left, closely approximating and potentially irritating the exiting left L3 nerve root without frank impingement (series 3, image 11). Moderate facet ligamentum flavum hypertrophy. Resultant moderate canal stenosis, stable. Mild left foraminal narrowing. L4-5: 5 mm anterolisthesis. Diffuse disc bulge with disc desiccation and intervertebral disc space  narrowing. Moderate facet arthrosis. No significant stenosis. L5-S1: Chronic diffuse degenerative intervertebral disc space narrowing with diffuse disc bulge and disc desiccation. Associated reactive endplate changes with marginal endplate osteophytosis. Superimposed tiny central disc protrusion (series 5, image 29). Mild facet ligamentum flavum hypertrophy. Mild bilateral subarticular stenosis without impingement. No significant foraminal encroachment. IMPRESSION: 1. Stable appearance of acute to subacute L1 compression fracture with approximately 50% height loss and 3 mm bony retropulsion. Resultant mild spinal stenosis without cord compression. 2. Right foraminal disc extrusion at L1-2, impinging upon the exiting right L1 nerve root. 3. Left eccentric disc bulge with superimposed tiny left subarticular disc extrusion at L2-3 as above. Either the left L2 or L3 nerve roots could be affected. 4. Left eccentric disc bulge at L3-4, closely approximating and potentially irritating the exiting left L3 nerve root without frank impingement. 5. 5 mm spondylolysis at L4-5 with associated disc bulge and moderate facet arthrosis without significant stenosis. 6. Stable degenerative disc disease at L5-S1 with resultant mild bilateral subarticular stenosis, slightly worse on the left. Electronically Signed   By: Rise Mu M.D.   On: 06/12/2017 14:06    Assessment/Plan:   1. Generalized weakness  Continue working with PT/OT  Continue exercises as taught by PT/OT  Ambulate with assistance  2. Spinal stenosis, lumbar region, with neurogenic claudication  Improved  Continue tramadol 50 mg po BID for pain  Continue APAP 650 mg po Q 6 hours prn  Continue Lidoderm patch 5% Q Day  Follow up with Neurologist asap after discharge for continuity of  care  3. Muscle spasm  Continue pain control as listed above  Continue Capsaicin 0.025 % cream to neck TID prn  Continue Baclofen 5 mg po Q 8 hours  prn  4. Wheeze  Resolved   Duonebs Q 6 hours prn  5. Protein-calorie malnutrition, unspecified severity (HCC)  Pro-Stat 30 mL po BID  Nepro 1 bottle/can po BID  4. Acute renal failure superimposed on chronic kidney disease, unspecified CKD stage, unspecified acute renal failure type (HCC)  Stable   5. Vitamin D deficiency  Stable  Continue Cholecalciferol 2,000 units- 2 capsules po Q Day  Follow up with PCP asap after discharge for continuity of care  Patient is being discharged with the following home health services: [facility] PT[facility] through [facility]   Patient is being discharged with the following durable medical equipment:    Patient has been advised to f/u with their PCP in 1-2 weeks to bring them up to date on their rehab stay.  Social services at facility was responsible for arranging this appointment.  Pt was provided with a 30 day supply of prescriptions for medications and refills must be obtained from their PCP.  For controlled substances, a more limited supply may be provided adequate until PCP appointment only.  Future labs/tests needed:    Family/ staff Communication:   Total Time:  Documentation:  Face to Face:  Family/Phone:  Brynda Rim, NP-C [facility] [address], [place] [address] Cell Phone (Mon-Fri 8am-5pm):  [phone] On Call:  [phone] & follow prompts after 5pm & weekends Office Phone:  [phone] Office Fax:  [phone]

## 2017-07-01 ENCOUNTER — Telehealth: Payer: Self-pay

## 2017-07-01 NOTE — Telephone Encounter (Signed)
error 

## 2017-07-06 ENCOUNTER — Telehealth: Payer: Self-pay | Admitting: Emergency Medicine

## 2017-07-06 ENCOUNTER — Ambulatory Visit (INDEPENDENT_AMBULATORY_CARE_PROVIDER_SITE_OTHER): Payer: Medicare Other | Admitting: Emergency Medicine

## 2017-07-06 ENCOUNTER — Encounter: Payer: Self-pay | Admitting: Emergency Medicine

## 2017-07-06 DIAGNOSIS — Z23 Encounter for immunization: Secondary | ICD-10-CM

## 2017-07-06 DIAGNOSIS — R053 Chronic cough: Secondary | ICD-10-CM

## 2017-07-06 DIAGNOSIS — R05 Cough: Secondary | ICD-10-CM | POA: Diagnosis not present

## 2017-07-06 MED ORDER — LORATADINE 10 MG PO TABS: 10.0000 mg | ORAL_TABLET | Freq: Every day | ORAL | 5 refills | Status: DC

## 2017-07-06 NOTE — Progress Notes (Signed)
Subjective:    Patient ID: Lisa [name], female    DOB: April 16, 1927, 83 y.o.   MRN: [id number]  HPI 83 year old never smoker, history of peptic ulcer disease, chronic back pain due to DDD, atrial fibrillation, chronic systolic CHF (? Stress induced), stage III chronic kidney disease, hypothyroidism, SAH, prior VT arrest in 07/2015. Her chart carries a diagnosis of COPD.   She is referred today for non-resolving cough, started about 4 - 5 weeks ago when she was at rehab post-hospitalization for UTI at [facility]. She was also having w wheeze that sounds more like stridor. She got some relief from cough drops, mints. She has some baseline nasal congestion. She denies any significant aspiration symptoms. No chest pain   She is on amiodarone, eliquis, irbesartan. She was on ranitidine, Prilosec was added in the last 2 months, may have been what improved her. Not on allergy meds                                                                               Review of Systems  Constitutional: Negative for fever and unexpected weight change.  HENT: Negative for congestion, dental problem, ear pain, nosebleeds, postnasal drip, rhinorrhea, sinus pressure, sneezing, sore throat and trouble swallowing.   Eyes: Negative for redness and itching.  Respiratory: Positive for cough, shortness of breath and wheezing. Negative for chest tightness.   Cardiovascular: Negative for palpitations and leg swelling.  Gastrointestinal: Negative for nausea and vomiting.  Genitourinary: Negative for dysuria.  Musculoskeletal: Negative for joint swelling.  Skin: Negative for rash.  Neurological: Negative for headaches.  Hematological: Does not bruise/bleed easily.  Psychiatric/Behavioral: Negative for dysphoric mood. The patient is not nervous/anxious.     Past Medical History:  Diagnosis Date  . Allergy   . Allergy to shellfish    causes swelling  . Brain bleed (HCC)   . Chronic back pain   . Chronic systolic CHF  (congestive heart failure) (HCC)   . Depression    unspecified  . Foot fracture    bilateral  . History of abnormal mammogram    Status post breast biopsy x 2, which were negative.   [name] History of chicken pox   . History of sinus tachycardia 11/1997   With dyspnea on exertion. Workup included a VQ scan which was normal, and ultrasound of her lower extremities, which was normal with no evidence of DVT.  [name] Hypertension   . Nocturnal leg cramps   . Osteoarthritis   . Osteoporosis   . Pelvis fracture (HCC)   . Peptic ulcer disease   . Renal disorder   . Spinal stenosis   . V-tach (HCC)   . Wrist fracture, right      Family History  Problem Relation Age of Onset  . COPD Mother   . High blood pressure Mother   . Lung disease Mother   . COPD Father   . Heart disease Father   . High blood pressure Father   . Heart attack Father   . Skin cancer Brother      Social History   Social History  . Marital status: Widowed    Spouse name: N/A  . Number  of children: 1  . Years of education: 50   Occupational History  . retired    Social History Main Topics  . Smoking status: Never Smoker  . Smokeless tobacco: Never Used  . Alcohol use 0.0 oz/week     Comment: occasional wine  . Drug use: No  . Sexual activity: Not on file   Other Topics Concern  . Not on file   Social History Narrative   Admitted to [facility] [place] 05/15/17 - 06/19/17   Widowed   Never smoker   Occasionally drinks wine   DNR     Allergies  Allergen Reactions  . Ambien [Zolpidem Tartrate] Other (See Comments)    Reaction:  Hallucinations   . Daypro [Oxaprozin] Other (See Comments)    GI upset  . Oxycodone Nausea And Vomiting  . Shellfish Allergy Swelling and Other (See Comments)    Pts mouth and face swells.   . Ace Inhibitors Cough  . Norvasc [Amlodipine Besylate] Palpitations     Outpatient Medications Prior to Visit  Medication Sig Dispense Refill  . acetaminophen (TYLENOL) 325 MG  tablet Take 650 mg by mouth every 6 (six) hours as needed.    [name] alum & mag hydroxide-simeth (MAALOX PLUS) 400-400-40 MG/5ML suspension Take 30 mLs by mouth every 4 (four) hours as needed for indigestion.    . Amino Acids-Protein Hydrolys (FEEDING SUPPLEMENT, PRO-STAT SUGAR FREE 64,) LIQD Take 30 mLs by mouth 2 (two) times daily between meals.    [name] amiodarone (PACERONE) 100 MG tablet Take 1 tablet (100 mg total) by mouth daily. 90 tablet 3  . apixaban (ELIQUIS) 2.5 MG TABS tablet Take 2.5 mg by mouth 2 (two) times daily.    . baclofen (LIORESAL) 10 MG tablet Take 0.5 tablets (5 mg total) by mouth every 8 (eight) hours as needed for muscle spasms. 30 tablet 4  . benzonatate (TESSALON) 100 MG capsule Take 1 capsule (100 mg total) by mouth 3 (three) times daily as needed for cough. 20 capsule 0  . camphor-menthol (SARNA) lotion Apply thin film to areas of itching 4 times daily as needed. Ok for family to apply and ok to leave at bedside.    . capsaicin (ZOSTRIX) 0.025 % cream Massage cream onto the posterior neck and shoulders topically 3 times daily for muscle cramps    . Cholecalciferol 2000 units CAPS Take 2 capsules by mouth daily. 4000 units = 2 caps    . citalopram (CELEXA) 20 MG tablet Take 20 mg by mouth daily.   4  . Cyanocobalamin (B-12) 1000 MCG TABS Take 1 tablet by mouth daily.     . ferrous sulfate 325 (65 FE) MG EC tablet Take 325 mg by mouth 2 (two) times daily.    . furosemide (LASIX) 20 MG tablet Take 20 mg by mouth daily.    [name] gabapentin (NEURONTIN) 100 MG capsule Take 1 capsule (100 mg total) by mouth 3 (three) times daily. Hold for excessive sleepiness (sedation) 90 capsule 0  . Infant Care Products (DERMACLOUD) CREA Apply liberal amount topically to area of skin irritation as needed. Ok to leave at bedside.    [name] ipratropium-albuterol (DUONEB) 0.5-2.5 (3) MG/3ML SOLN Take 3 mLs by nebulization 2 (two) times daily.     [name] ipratropium-albuterol (DUONEB) 0.5-2.5 (3) MG/3ML SOLN Take 3  mLs by nebulization every 6 (six) hours as needed.    . irbesartan (AVAPRO) 150 MG tablet Take 150 mg by mouth daily.     [name]  levothyroxine (SYNTHROID, LEVOTHROID) 25 MCG tablet Take 25 mcg by mouth daily before breakfast.    . lidocaine (LIDODERM) 5 % Place 1 patch onto the skin every 12 (twelve) hours. Remove & Discard patch within 12 hours or as directed by MD 10 patch 0  . metoprolol tartrate (LOPRESSOR) 25 MG tablet Take 12.5 mg by mouth 2 (two) times daily.   4  . mirtazapine (REMERON) 15 MG tablet Take 15 mg by mouth at bedtime.   4  . Nutritional Supplements (FEEDING SUPPLEMENT, NEPRO CARB STEADY,) LIQD Take 1,000 mLs by mouth 2 (two) times daily between meals.    [name] omeprazole (PRILOSEC) 20 MG capsule Take 1 capsule by mouth daily.   10  . Polyethyl Glycol-Propyl Glycol (SYSTANE) 0.4-0.3 % SOLN Place 1 drop into both eyes as needed.     . potassium chloride (MICRO-K) 10 MEQ CR capsule Take 10 mEq by mouth daily.    . raloxifene (EVISTA) 60 MG tablet Take 1 tablet by mouth daily.     . ranitidine (ZANTAC) 75 MG tablet Take 75 mg by mouth 2 (two) times daily.    . risperiDONE (RISPERDAL) 0.25 MG tablet TAKE ONE TABLET EVERY DAY AT BEDTIME 30 tablet 2  . senna-docusate (SENNA-PLUS) 8.6-50 MG tablet Take 1 tablet by mouth 2 (two) times daily.     . traMADol (ULTRAM) 50 MG tablet Take 1 tablet (50 mg total) by mouth 2 (two) times daily. 60 tablet 1  . vitamin C (ASCORBIC ACID) 500 MG tablet Take 500 mg by mouth 2 (two) times daily.      Facility-Administered Medications Prior to Visit  Medication Dose Route Frequency Provider Last Rate Last Dose  . propranolol (INDERAL) tablet 10 mg  10 mg Oral TID Ranelle Oyster, MD            Objective:   Physical Exam  Vitals:   07/06/17 0927 07/06/17 0928  BP:  120/82  Pulse:  (!) 55  SpO2:  97%  Weight: 170 lb (77.1 kg)   Height:  (1.575 m)    Gen: Pleasant, Elderly woman in a wheelchair, in no distress,  normal affect  ENT: No  lesions,  mouth clear,  Mild nasal congestion with no gtt, hoarse voice  Neck: No JVD, coarse air movement but no stridor.   Lungs: No use of accessory muscles, clear without rales or rhonchi  Cardiovascular: RRR, heart sounds normal, no murmur or gallops, no LE edema  Musculoskeletal: No deformities, no cyanosis or clubbing  Neuro: alert, non focal  Skin: Warm, no lesions or rashes   05/14/17 --  COMPARISON:  05/13/2017  FINDINGS: The heart size and mediastinal contours are within normal limits. Both lungs are clear. The visualized skeletal structures are unremarkable.  IMPRESSION: No active disease.     Assessment & Plan:  Chronic cough This seemed to correlate with her recent stay at rehabilitation post hospitalization. She notes that there seemed to be a temporal connection between worsening reflux and her sustained cough, upper airway noise. Both got better when Prilosec was added to her ranitidine. She denies any significant or frequent aspiration. Chest x-ray does not show any evidence to support amiodarone toxicity. She is a never smoker, obstructive lung disease unlikely. She does keep a hoarse voice, question whether chronic rhinitis is contributor.  Please continue to take your ranitidine and Prilosec as you have been using them Please start Loratadine 10 mg once a day. Take this for at least one  month to see if it helps your congestion, throat irritation and hoarseness. If it helps you can continue it indefinitely. Follow with Dr Delton Coombes as needed for any recurrence or worsening of your cough.  Levy Pupa, MD, PhD 07/06/2017, 10:07 AM [facility] [phone] or if no answer [phone]

## 2017-07-06 NOTE — Assessment & Plan Note (Signed)
This seemed to correlate with her recent stay at rehabilitation post hospitalization. She notes that there seemed to be a temporal connection between worsening reflux and her sustained cough, upper airway noise. Both got better when Prilosec was added to her ranitidine. She denies any significant or frequent aspiration. Chest x-ray does not show any evidence to support amiodarone toxicity. She is a never smoker, obstructive lung disease unlikely. She does keep a hoarse voice, question whether chronic rhinitis is contributor.  Please continue to take your ranitidine and Prilosec as you have been using them Please start Loratadine 10 mg once a day. Take this for at least one month to see if it helps your congestion, throat irritation and hoarseness. If it helps you can continue it indefinitely. Follow with Dr Delton Coombes as needed for any recurrence or worsening of your cough.

## 2017-07-06 NOTE — Patient Instructions (Addendum)
Please continue to take your ranitidine and Prilosec as you have been using them Please start Loratadine 10 mg once a day. Take this for at least one month to see if it helps your congestion, throat irritation and hoarseness. If it helps you can continue it indefinitely. Follow with Dr Delton Coombes as needed for any recurrence or worsening of your cough.

## 2017-07-06 NOTE — Telephone Encounter (Signed)
Rx sent through surescripts for Claritin to [facility]. Nothing further is needed.

## 2017-07-16 ENCOUNTER — Telehealth: Payer: Self-pay | Admitting: *Deleted

## 2017-07-16 NOTE — Telephone Encounter (Signed)
Pharmacy has question about gabapentin.  They have hold until further notice.  I do not see any mention of it in Dr Riley KillSwartz note 06/23/17.  Last mention of gabapentin was from [facility] home 06/19/17 and it says hold for excessive sleepiness (sedation).  I have relayed this to the pharmacy.

## 2017-07-27 ENCOUNTER — Encounter: Payer: Medicare Other | Attending: Physical Medicine & Rehabilitation | Admitting: Physical Medicine & Rehabilitation

## 2017-07-27 ENCOUNTER — Encounter: Payer: Self-pay | Admitting: Physical Medicine & Rehabilitation

## 2017-07-27 VITALS — BP 158/77 | HR 67 | Temp 99.0°F

## 2017-07-27 DIAGNOSIS — M48061 Spinal stenosis, lumbar region without neurogenic claudication: Secondary | ICD-10-CM | POA: Diagnosis not present

## 2017-07-27 DIAGNOSIS — R251 Tremor, unspecified: Secondary | ICD-10-CM

## 2017-07-27 DIAGNOSIS — I5022 Chronic systolic (congestive) heart failure: Secondary | ICD-10-CM | POA: Insufficient documentation

## 2017-07-27 DIAGNOSIS — L89622 Pressure ulcer of left heel, stage 2: Secondary | ICD-10-CM | POA: Diagnosis not present

## 2017-07-27 DIAGNOSIS — M5126 Other intervertebral disc displacement, lumbar region: Secondary | ICD-10-CM | POA: Diagnosis not present

## 2017-07-27 DIAGNOSIS — I110 Hypertensive heart disease with heart failure: Secondary | ICD-10-CM | POA: Insufficient documentation

## 2017-07-27 DIAGNOSIS — S069X0S Unspecified intracranial injury without loss of consciousness, sequela: Secondary | ICD-10-CM

## 2017-07-27 DIAGNOSIS — M47816 Spondylosis without myelopathy or radiculopathy, lumbar region: Secondary | ICD-10-CM | POA: Insufficient documentation

## 2017-07-27 DIAGNOSIS — G4700 Insomnia, unspecified: Secondary | ICD-10-CM | POA: Diagnosis not present

## 2017-07-27 DIAGNOSIS — R269 Unspecified abnormalities of gait and mobility: Secondary | ICD-10-CM | POA: Diagnosis present

## 2017-07-27 DIAGNOSIS — M5416 Radiculopathy, lumbar region: Secondary | ICD-10-CM

## 2017-07-27 DIAGNOSIS — F329 Major depressive disorder, single episode, unspecified: Secondary | ICD-10-CM | POA: Diagnosis not present

## 2017-07-27 DIAGNOSIS — M1990 Unspecified osteoarthritis, unspecified site: Secondary | ICD-10-CM | POA: Insufficient documentation

## 2017-07-27 NOTE — Progress Notes (Signed)
Subjective:    Patient ID: Lisa [name], female    DOB: 1927/06/24, 83 y.o.   MRN: [id number]  HPI   Lisa [name] is here in follow-up regarding her chronic low back pain and other issues associated to her brain injury.  She has been receiving home health physical and occupational therapy and it has reported some improvement in her low back pain.  We started Inderal at last visit and she states that this has dramatically improved her tremors.  Her sleep is been reasonable.  Her biggest problem now is that she is developed a wound on her left foot.  It  is located at her left heel.  She has seen apparently her primary doctor who recommended observation.  The heel has become tender and has affected her ability to walk.  She has been reticent to wear a boot for elevate the foot on a pillow.  She denies any fever or chills.  She is wearing a Band-Aid over the left heel.    Pain Inventory Average Pain 4 Pain Right Now 4 My pain is dull  In the last 24 hours, has pain interfered with the following? General activity 3 Relation with others 3 Enjoyment of life 3 What TIME of day is your pain at its worst? daytime Sleep (in general) Good  Pain is worse with: standing Pain improves with: . Relief from Meds: .  Mobility use a walker  Function Do you have any goals in this area?  no  Neuro/Psych loss of taste or smell  Prior Studies Any changes since last visit?  no  Physicians involved in your care Any changes since last visit?  no   Family History  Problem Relation Age of Onset  . COPD Mother   . High blood pressure Mother   . Lung disease Mother   . COPD Father   . Heart disease Father   . High blood pressure Father   . Heart attack Father   . Skin cancer Brother    Social History   Socioeconomic History  . Marital status: Widowed    Spouse name: Not on file  . Number of children: 1  . Years of education: 36  . Highest education level: Not on file  Social Needs  .  Financial resource strain: Not on file  . Food insecurity - worry: Not on file  . Food insecurity - inability: Not on file  . Transportation needs - medical: Not on file  . Transportation needs - non-medical: Not on file  Occupational History  . Occupation: retired  Tobacco Use  . Smoking status: Never Smoker  . Smokeless tobacco: Never Used  Substance and Sexual Activity  . Alcohol use: Yes    Alcohol/week: 0.0 oz    Comment: occasional wine  . Drug use: No  . Sexual activity: Not on file  Other Topics Concern  . Not on file  Social History Narrative   Admitted to [facility] [place] 05/15/17 - 06/19/17   Widowed   Never smoker   Occasionally drinks wine   DNR   Past Surgical History:  Procedure Laterality Date  . BACK SURGERY N/A   . BREAST LUMPECTOMY N/A   . FRACTURE SURGERY  08/25/2013   ORIF left distal radius fracture.  . WISDOM TOOTH EXTRACTION     Past Medical History:  Diagnosis Date  . Allergy   . Allergy to shellfish    causes swelling  . Brain bleed (HCC)   .  Chronic back pain   . Chronic systolic CHF (congestive heart failure) (HCC)   . Depression    unspecified  . Foot fracture    bilateral  . History of abnormal mammogram    Status post breast biopsy x 2, which were negative.   [name] History of chicken pox   . History of sinus tachycardia 11/1997   With dyspnea on exertion. Workup included a VQ scan which was normal, and ultrasound of her lower extremities, which was normal with no evidence of DVT.  [name] Hypertension   . Nocturnal leg cramps   . Osteoarthritis   . Osteoporosis   . Pelvis fracture (HCC)   . Peptic ulcer disease   . Renal disorder   . Spinal stenosis   . V-tach (HCC)   . Wrist fracture, right    There were no vitals taken for this visit.  Opioid Risk Score:   Fall Risk Score:  `1  Depression screen PHQ 2/9  Depression screen [facility] 2/9 11/14/2016 10/22/2015 07/24/2015 07/16/2015 06/26/2015 05/22/2015 04/23/2015  Decreased Interest 1 0 0  0 0 0 0  Down, Depressed, Hopeless 1 0 0 0 0 0 0  PHQ - 2 Score 2 0 0 0 0 0 0  Altered sleeping 2 0 - - - - -  Tired, decreased energy 1 0 - - - - -  Change in appetite 0 0 - - - - -  Feeling bad or failure about yourself  0 0 - - - - -  Trouble concentrating 0 0 - - - - -  Moving slowly or fidgety/restless 0 0 - - - - -  Suicidal thoughts 0 0 - - - - -  PHQ-9 Score 5 0 - - - - -  Some recent data might be hidden     Review of Systems  Constitutional: Negative.   HENT: Negative.   Eyes: Negative.   Respiratory: Negative.   Cardiovascular: Negative.   Gastrointestinal: Negative.   Endocrine: Negative.   Genitourinary: Negative.   Musculoskeletal: Negative.   Skin: Negative.   Allergic/Immunologic: Negative.   Neurological: Negative.   Hematological: Negative.   Psychiatric/Behavioral: Negative.   All other systems reviewed and are negative.      Objective:   Physical Exam  HENT:  Head: NCAT.  Mouth/Throat: oral membranes moist Eyes:PERRL  Neck: normal ROM  Cardiovascular: rrr Respiratory:  Normal effort  GI: soft  Musculoskeletal: Low back less tender. Still sore along left L4-5 area. More tender with flexion than extension. .  Neurological: HOH, alert and oriented. Reasonable insight and awareness.  Strength 4-5/5 in UE and 4-/5 prox LE to 4+/5 distally. Tremor has nearly resolved.  No rigidity noted.  Sensation intact to light touch in bilateral upper and lower limbs--stable.  Skin: 3cm diameter stage two WOUND on left heel.  Psych: pleasant and appropriate.    Assessment & Plan:  Medical Problem List and Plan: 1. 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  -pt near cognitive baseline            -recommend ongoing supervision for safety  2. Left heel ulcer.   -pressure relief  -wet to dry dressing to left heel bid  -I have asked a home  health nurse to follow the wound.  If it fails to heal we will make a referral to the wound care clinic at [facility]. 3. Chronic back pain/Pain Management: lumbar post lami syndrome. Last ESI  07/16/15 by Dr. Metta Clinesrisp.  - again, she has multiple potential pain generators as evidenced by this most recent CT.              -she did respond to the L3-4 MBB's however.              -consider ESI for further pain control -Her back pain has improved with time and with home therapies.  Recommended home exercise program as possible. 4. Tremor: age related/TBI effect             -continue strengthening efforts with PT             -trial of low dose propranolol 10mg  TID. Has been successful  5. Insomnia, ?mild sundowning? - continue low dose risperdal for sleep/mood, 0.25mg  qhs #30.   6. Right shoulder pain: likely RTC tendonitis. Somewhat improved 7. Depression: celexa 8. Recent UTI/urosepsi: reviewed signs and symptoms of infection. Needs to continue drinking enough water also.   Follow up with me in about 3-4 weeks. 15 minutes of face to face patient care time were spent during this visit. All questions were encouraged and answered.

## 2017-07-27 NOTE — Patient Instructions (Signed)
  KEEP HEEL ELEVATED AND WEAR BOOT ON WHEN IN BED (OR CAN USE PILLOW) TO KEEP PRESSURE OFF HEEL   USE WET TO DRY DRESSING TO LEFT HEEL, CHANGE TWICE DAILY   PLEASE FEEL FREE TO CALL OUR OFFICE WITH ANY PROBLEMS OR QUESTIONS [phone])

## 2017-07-28 ENCOUNTER — Telehealth: Payer: Self-pay

## 2017-07-28 NOTE — Telephone Encounter (Signed)
Passed message on to the physical therapist

## 2017-07-28 NOTE — Telephone Encounter (Signed)
That would be fine 

## 2017-07-28 NOTE — Telephone Encounter (Signed)
Dian QueenLynn Collins [facility] verbal orders continued for 2 more weeks

## 2017-07-29 ENCOUNTER — Telehealth: Payer: Self-pay

## 2017-07-29 NOTE — Telephone Encounter (Signed)
[facility] orders from Dr. Riley KillSwartz for patient to have an iodine and dry drip for her pressure wound instead of previous order. The pressure wound is on patients right heel.

## 2017-07-29 NOTE — Telephone Encounter (Signed)
Contacted RN and gave verbal orders per office protocol

## 2017-07-29 NOTE — Telephone Encounter (Signed)
That is fine.  The wound is on her left heel

## 2017-08-03 ENCOUNTER — Telehealth: Payer: Self-pay

## 2017-08-03 ENCOUNTER — Telehealth: Payer: Self-pay | Admitting: Physical Medicine & Rehabilitation

## 2017-08-03 NOTE — Telephone Encounter (Signed)
Error

## 2017-08-03 NOTE — Telephone Encounter (Signed)
Patients aide called today, states patients leg/foot is very swollen, reddish and hot to the touch, is draining a yellow viscous fluid that is thicker and more yellow then on previous visit, and that the patient has started to run a low-grade fever over the last 3-4 days, patient wanted to know if they could be seen here today, I advised them that it would be a better idea to go and be seen immediately over at the ER or Urgent care or Primary's office instead due to nature of the situation.  Patient agreed and will be seen today at either listed locations.

## 2017-08-05 NOTE — Telephone Encounter (Signed)
Yes, the leg needs to be examined immediately if those changes are taking place. Thanks for counseling her appropriately.

## 2017-08-24 ENCOUNTER — Other Ambulatory Visit: Payer: Self-pay | Admitting: Physical Medicine & Rehabilitation

## 2017-08-24 ENCOUNTER — Other Ambulatory Visit: Payer: Self-pay

## 2017-08-24 ENCOUNTER — Encounter: Payer: Medicare Other | Attending: Physical Medicine & Rehabilitation | Admitting: Physical Medicine & Rehabilitation

## 2017-08-24 ENCOUNTER — Encounter: Payer: Self-pay | Admitting: Physical Medicine & Rehabilitation

## 2017-08-24 VITALS — BP 137/85 | HR 69

## 2017-08-24 DIAGNOSIS — R269 Unspecified abnormalities of gait and mobility: Secondary | ICD-10-CM | POA: Insufficient documentation

## 2017-08-24 DIAGNOSIS — L89622 Pressure ulcer of left heel, stage 2: Secondary | ICD-10-CM | POA: Diagnosis not present

## 2017-08-24 DIAGNOSIS — F409 Phobic anxiety disorder, unspecified: Secondary | ICD-10-CM

## 2017-08-24 DIAGNOSIS — M5416 Radiculopathy, lumbar region: Secondary | ICD-10-CM

## 2017-08-24 DIAGNOSIS — G4700 Insomnia, unspecified: Secondary | ICD-10-CM | POA: Diagnosis not present

## 2017-08-24 DIAGNOSIS — F329 Major depressive disorder, single episode, unspecified: Secondary | ICD-10-CM | POA: Insufficient documentation

## 2017-08-24 DIAGNOSIS — I5022 Chronic systolic (congestive) heart failure: Secondary | ICD-10-CM | POA: Diagnosis not present

## 2017-08-24 DIAGNOSIS — I110 Hypertensive heart disease with heart failure: Secondary | ICD-10-CM | POA: Insufficient documentation

## 2017-08-24 DIAGNOSIS — M47816 Spondylosis without myelopathy or radiculopathy, lumbar region: Secondary | ICD-10-CM | POA: Insufficient documentation

## 2017-08-24 DIAGNOSIS — M5126 Other intervertebral disc displacement, lumbar region: Secondary | ICD-10-CM | POA: Insufficient documentation

## 2017-08-24 DIAGNOSIS — M48061 Spinal stenosis, lumbar region without neurogenic claudication: Secondary | ICD-10-CM | POA: Insufficient documentation

## 2017-08-24 DIAGNOSIS — M1990 Unspecified osteoarthritis, unspecified site: Secondary | ICD-10-CM | POA: Insufficient documentation

## 2017-08-24 DIAGNOSIS — F5105 Insomnia due to other mental disorder: Principal | ICD-10-CM

## 2017-08-24 NOTE — Progress Notes (Signed)
Subjective:    Patient ID: Lisa [name], female    DOB: 04-14-1927, 83 y.o.   MRN: [id number]  HPI . Tawana is here in follow up of her chronic pain as well as her wound care issues and TBI.  She states that her low back is been feeling fairly well.  She does her regular stretches and exercises at home.  Uses her walker for balance and has made the house as well.  Her tremor additionally, has been under control.  At last visit we assessed and made recommendations for left heel wound.  HHRN has been coming out and tending to her heel wound. It is showing improvement.  She states that the pain there is much better as well.  She is avoided wearing shoes with backs on them.   Pain Inventory Average Pain 3 Pain Right Now 1 My pain is burning  In the last 24 hours, has pain interfered with the following? General activity 0 Relation with others 0 Enjoyment of life 0 What TIME of day is your pain at its worst? evening Sleep (in general) Good  Pain is worse with: standing Pain improves with: . Relief from Meds: 0  Mobility use a walker ability to climb steps?  yes do you drive?  no transfers alone  Function I need assistance with the following:  shopping  Neuro/Psych tremor loss of taste or smell  Prior Studies Any changes since last visit?  no  Physicians involved in your care Any changes since last visit?  no   Family History  Problem Relation Age of Onset  . COPD Mother   . High blood pressure Mother   . Lung disease Mother   . COPD Father   . Heart disease Father   . High blood pressure Father   . Heart attack Father   . Skin cancer Brother    Social History   Socioeconomic History  . Marital status: Widowed    Spouse name: Not on file  . Number of children: 1  . Years of education: 29  . Highest education level: Not on file  Social Needs  . Financial resource strain: Not on file  . Food insecurity - worry: Not on file  . Food insecurity - inability: Not  on file  . Transportation needs - medical: Not on file  . Transportation needs - non-medical: Not on file  Occupational History  . Occupation: retired  Tobacco Use  . Smoking status: Never Smoker  . Smokeless tobacco: Never Used  Substance and Sexual Activity  . Alcohol use: Yes    Alcohol/week: 0.0 oz    Comment: occasional wine  . Drug use: No  . Sexual activity: Not on file  Other Topics Concern  . Not on file  Social History Narrative   Admitted to [facility] [place] 05/15/17 - 06/19/17   Widowed   Never smoker   Occasionally drinks wine   DNR   Past Surgical History:  Procedure Laterality Date  . BACK SURGERY N/A   . BREAST LUMPECTOMY N/A   . FRACTURE SURGERY  08/25/2013   ORIF left distal radius fracture.  . WISDOM TOOTH EXTRACTION     Past Medical History:  Diagnosis Date  . Allergy   . Allergy to shellfish    causes swelling  . Brain bleed (HCC)   . Chronic back pain   . Chronic systolic CHF (congestive heart failure) (HCC)   . Depression    unspecified  . Foot  fracture    bilateral  . History of abnormal mammogram    Status post breast biopsy x 2, which were negative.   [name]. History of chicken pox   . History of sinus tachycardia 11/1997   With dyspnea on exertion. Workup included a VQ scan which was normal, and ultrasound of her lower extremities, which was normal with no evidence of DVT.  [name]. Hypertension   . Nocturnal leg cramps   . Osteoarthritis   . Osteoporosis   . Pelvis fracture (HCC)   . Peptic ulcer disease   . Renal disorder   . Spinal stenosis   . V-tach (HCC)   . Wrist fracture, right    There were no vitals taken for this visit.  Opioid Risk Score:   Fall Risk Score:  `1  Depression screen PHQ 2/9  Depression screen [facility] 2/9 08/24/2017 11/14/2016 10/22/2015 07/24/2015 07/16/2015 06/26/2015 05/22/2015  Decreased Interest 0 1 0 0 0 0 0  Down, Depressed, Hopeless 0 1 0 0 0 0 0  PHQ - 2 Score 0 2 0 0 0 0 0  Altered sleeping - 2 0 - - - -    Tired, decreased energy - 1 0 - - - -  Change in appetite - 0 0 - - - -  Feeling bad or failure about yourself  - 0 0 - - - -  Trouble concentrating - 0 0 - - - -  Moving slowly or fidgety/restless - 0 0 - - - -  Suicidal thoughts - 0 0 - - - -  PHQ-9 Score - 5 0 - - - -  Some recent data might be hidden  137/85   Review of Systems  Constitutional: Positive for unexpected weight change.  HENT: Negative.   Eyes: Negative.   Respiratory: Positive for wheezing.   Cardiovascular: Negative.   Gastrointestinal: Negative.   Endocrine: Negative.   Genitourinary: Negative.   Musculoskeletal: Negative.   Skin: Negative.   Allergic/Immunologic: Negative.   Neurological: Negative.   Hematological: Negative.   Psychiatric/Behavioral: Negative.        Objective:   Physical Exam  HENT:  Head:NCAT.  Mouth/Throat: oral membranes moist Eyes:PERRL Neck:normal ROM Cardiovascular:RRR Respiratory: normal effort [id number]:[id number]:Nontender and soft Musculoskeletal:Mild low back pain with palpation.  She does have limitations in flexion and extension still but is moving much better.  She transfers easily. Neurological: HOH,alert and oriented. Reasonable insight and awareness. Strength 4-5/5 in UE and 4-/5 prox LE to 4+/5 distally.   No tremor seen today.Sensation intact to light touch in bilateral upper and lower limbs--stable.  Skin: only 1cm diameter stage two WOUND on left heel with mild fibrinous discharge Psych: Patient is pleasant and appropriate as always   Assessment & Plan:  Medical Problem List and Plan: 1. 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  -pt near cognitive baseline -continue supervision for safety  2. Left heel ulcer.             -healing nicely   -[facility]  Wound needs to be scrubbed daily with dressing change to remove  fibronecrotic debris.  Otherwise area underneath is pink and granulating 3. Chronic back pain/Pain Management: lumbar post lami syndrome. [facility] by Dr. Metta Clinesrisp.  - again,she has multiple potential pain generators as evidenced by this most recent CT. -  responded to the L3-4 MBB's however.  -consider ESI in the future if pain recurs or worsens -  Continue with HEP for low back and general physical fitness 4.Tremor: age related/TBI effect -continue strengthening efforts with PT -continue low dose propranolol 10mg  TID. Has been successful  5. Insomnia, ?mild sundowning? - continue low dose risperdal for sleep/mood, 0.25mg  qhs #30.  6. Right shoulder pain: likely RTC tendonitis.Somewhat improved 7. Depression: celexa  Follow up in 3 months. 15 minutes of face to face patient care time were spent during this visit. All questions were encouraged and answered.

## 2017-08-24 NOTE — Patient Instructions (Signed)
  WOUND CARE:  You may continue the current dressing to your left heel.  I would make sure to scrub free any loose debris and material from the wound to help exposed the pink scar tissue underneath.  The small scab that remains should come off on its own over the next several days ahead.   HAPPY HOLIDAYS!

## 2017-09-14 ENCOUNTER — Other Ambulatory Visit: Payer: Self-pay | Admitting: Emergency Medicine

## 2017-09-30 ENCOUNTER — Encounter: Payer: Medicare Other | Attending: Physical Medicine & Rehabilitation | Admitting: Physical Medicine & Rehabilitation

## 2017-09-30 ENCOUNTER — Encounter: Payer: Self-pay | Admitting: Physical Medicine & Rehabilitation

## 2017-09-30 VITALS — BP 138/83 | HR 76

## 2017-09-30 DIAGNOSIS — G4700 Insomnia, unspecified: Secondary | ICD-10-CM | POA: Insufficient documentation

## 2017-09-30 DIAGNOSIS — L89622 Pressure ulcer of left heel, stage 2: Secondary | ICD-10-CM

## 2017-09-30 DIAGNOSIS — M48061 Spinal stenosis, lumbar region without neurogenic claudication: Secondary | ICD-10-CM | POA: Insufficient documentation

## 2017-09-30 DIAGNOSIS — F329 Major depressive disorder, single episode, unspecified: Secondary | ICD-10-CM | POA: Insufficient documentation

## 2017-09-30 DIAGNOSIS — M1990 Unspecified osteoarthritis, unspecified site: Secondary | ICD-10-CM | POA: Diagnosis not present

## 2017-09-30 DIAGNOSIS — R251 Tremor, unspecified: Secondary | ICD-10-CM | POA: Diagnosis not present

## 2017-09-30 DIAGNOSIS — M5126 Other intervertebral disc displacement, lumbar region: Secondary | ICD-10-CM | POA: Diagnosis not present

## 2017-09-30 DIAGNOSIS — I5022 Chronic systolic (congestive) heart failure: Secondary | ICD-10-CM | POA: Diagnosis not present

## 2017-09-30 DIAGNOSIS — I110 Hypertensive heart disease with heart failure: Secondary | ICD-10-CM | POA: Insufficient documentation

## 2017-09-30 DIAGNOSIS — R269 Unspecified abnormalities of gait and mobility: Secondary | ICD-10-CM | POA: Insufficient documentation

## 2017-09-30 DIAGNOSIS — M5416 Radiculopathy, lumbar region: Secondary | ICD-10-CM

## 2017-09-30 DIAGNOSIS — M47816 Spondylosis without myelopathy or radiculopathy, lumbar region: Secondary | ICD-10-CM

## 2017-09-30 MED ORDER — COLLAGENASE 250 UNIT/GM EX OINT
1.0000 | TOPICAL_OINTMENT | Freq: Every day | CUTANEOUS | 0 refills | Status: DC
Start: 2017-09-30 — End: 2018-01-19

## 2017-09-30 NOTE — Patient Instructions (Addendum)
Left heel treatment:   Once daily apply the collagenase ointment to a slightly moist piece of gauze.  Placed that gauze on the heel wound and wrap it with dry Kerlix wrap.  Perform this daily.    For your back:  Apply heat and or ice liberally. Use both or whatever works best to help loose your back muscles. Also work on some basic back stretches.    PLEASE FEEL FREE TO CALL OUR OFFICE WITH ANY PROBLEMS OR QUESTIONS [phone])

## 2017-09-30 NOTE — Progress Notes (Signed)
Subjective:    Patient ID: Lisa [name], female    DOB: [birth year], 84 y.o.   MRN: [id number]  HPI   Mrs. Degen is here in follow up of her TBI and chronic pain. She fell in the shower when her bench was misplaced and it fell away from her. She hit her back side on the bottom of the shower and her left foot went out the shower door. Since then she has had more of a left sided headache as well as low back and leg pain. She feels that the back might be improving but she still has some pain along the left side of her back.   Her left heel wound has been slow to improve.  They are using a Band-Aid currently on the wound.  Home health nurse has been following apparently.  Some of the eschar is gone but there still is a fairly large wound and there is drainage at times seen by patient and caregivers      Pain Inventory Average Pain 5 Pain Right Now 3 My pain is dull and tingling  In the last 24 hours, has pain interfered with the following? General activity 5 Relation with others 5 Enjoyment of life 5 What TIME of day is your pain at its worst? evening Sleep (in general) Good  Pain is worse with: standing Pain improves with: . Relief from Meds: .  Mobility use a walker  Function retired I need assistance with the following:  bathing and shopping  Neuro/Psych numbness tremor tingling loss of taste or smell  Prior Studies Any changes since last visit?  no  Physicians involved in your care Any changes since last visit?  no   Family History  Problem Relation Age of Onset  . COPD Mother   . High blood pressure Mother   . Lung disease Mother   . COPD Father   . Heart disease Father   . High blood pressure Father   . Heart attack Father   . Skin cancer Brother    Social History   Socioeconomic History  . Marital status: Widowed    Spouse name: Not on file  . Number of children: 1  . Years of education: 46  . Highest education level: Not on file  Social Needs    . Financial resource strain: Not on file  . Food insecurity - worry: Not on file  . Food insecurity - inability: Not on file  . Transportation needs - medical: Not on file  . Transportation needs - non-medical: Not on file  Occupational History  . Occupation: retired  Tobacco Use  . Smoking status: Never Smoker  . Smokeless tobacco: Never Used  Substance and Sexual Activity  . Alcohol use: Yes    Alcohol/week: 0.0 oz    Comment: occasional wine  . Drug use: No  . Sexual activity: Not on file  Other Topics Concern  . Not on file  Social History Narrative   Admitted to [facility] [place] 05/15/17 - 06/19/17   Widowed   Never smoker   Occasionally drinks wine   DNR   Past Surgical History:  Procedure Laterality Date  . BACK SURGERY N/A   . BREAST LUMPECTOMY N/A   . FRACTURE SURGERY  08/25/2013   ORIF left distal radius fracture.  . WISDOM TOOTH EXTRACTION     Past Medical History:  Diagnosis Date  . Allergy   . Allergy to shellfish    causes swelling  .  Brain bleed (HCC)   . Chronic back pain   . Chronic systolic CHF (congestive heart failure) (HCC)   . Depression    unspecified  . Foot fracture    bilateral  . History of abnormal mammogram    Status post breast biopsy x 2, which were negative.   [name]. History of chicken pox   . History of sinus tachycardia 11/1997   With dyspnea on exertion. Workup included a VQ scan which was normal, and ultrasound of her lower extremities, which was normal with no evidence of DVT.  [name]. Hypertension   . Nocturnal leg cramps   . Osteoarthritis   . Osteoporosis   . Pelvis fracture (HCC)   . Peptic ulcer disease   . Renal disorder   . Spinal stenosis   . V-tach (HCC)   . Wrist fracture, right    There were no vitals taken for this visit.  Opioid Risk Score:   Fall Risk Score:  `1  Depression screen PHQ 2/9  Depression screen [facility] 08/24/2017 11/14/2016 10/22/2015 07/24/2015 07/16/2015 06/26/2015 05/22/2015  Decreased Interest  0 1 0 0 0 0 0  Down, Depressed, Hopeless 0 1 0 0 0 0 0  PHQ - 2 Score 0 2 0 0 0 0 0  Altered sleeping - 2 0 - - - -  Tired, decreased energy - 1 0 - - - -  Change in appetite - 0 0 - - - -  Feeling bad or failure about yourself  - 0 0 - - - -  Trouble concentrating - 0 0 - - - -  Moving slowly or fidgety/restless - 0 0 - - - -  Suicidal thoughts - 0 0 - - - -  PHQ-9 Score - 5 0 - - - -  Some recent data might be hidden     Review of Systems  Constitutional: Negative.   HENT: Negative.   Eyes: Negative.   Respiratory: Negative.   Cardiovascular: Negative.   Gastrointestinal: Negative.   Endocrine: Negative.   Genitourinary: Negative.   Musculoskeletal: Positive for back pain, joint swelling and myalgias.  Skin: Negative.   Allergic/Immunologic: Negative.   Neurological: Positive for numbness.  Hematological: Negative.   Psychiatric/Behavioral: Negative.   All other systems reviewed and are negative.      Objective:   Physical Exam HENT:  Head:NCAT.  Mouth/Throat: oral membranes moist Eyes:PERRL Neck:normal ROM Cardiovascular:Regular rate Respiratory:Normal effort JY:[id number]:Nontender and soft Musculoskeletal:Mild low back pain with palpation.    Left low back and iliac crest somewhat tender to palpation.  There is no visible bruising or swelling .   Neurological: HOH,alert and oriented. Reasonable insight and awareness. Strength 4-5/5 in UE and 4-/5 prox LE to 4+/5 distally.  No tremor seen today.Sensation intact to light touch in bilateral upper and lower limbs--stable.  Skin:2.5 cm diameter stage two WOUND on left heel  with surrounding fibrinous tissue and some drainage.  Central area of the wound appears to be granulating.  There is some tenderness to the heel Psych: Patient is pleasant and appropriate as always   Assessment & Plan:  Medical Problem List and Plan: 1. Cognitive deficits, gait disorder secondary to Traumatic frontal intracranial  hemorrhage, subarachnoid hemorrhage and subdural hemorrhage.  -pt near cognitive baseline -continue supervision for safety 2. Left heel ulcer.  -Wound looks better last time I saw her.  She needs removal of fibronecrotic tissue.  -We will begin a trial of collagenase and moist dressing daily to the  heel, covered with Kerlix gauze .  -If he has not shown further healing by next month I may refer her to the wound care clinic 3. Chronic back pain/Pain Management: lumbar post lami syndrome. Last [facility] 07/16/15 by Dr. Metta Clines.  - again,she has multiple potential pain generators as evidenced by this most recent CT. -  responded to the L3-4 MBB's however.  -consider ESI in the future if pain recurs or worsens -After recent fall, she may have contused her pelvis and low back.  She seems to have not suffered any substantial sequelae a fortunately.  We discussed use of heat and ice as well as regular stretching at home 4.Tremor:age related/TBI effect -continue strengthening efforts with PT -continue low dose propranolol 10mg TID. Has been successful  5. Insomnia, ?mild sundowning? -continuelow dose risperdal for sleep/mood, 0.25mg  qhs #30. 6. Right shoulder pain: likely RTC tendonitis.Somewhat improved 7. Depression: celexa  Follow up in 1 months. of face to face patient care time were spent during this visit. All questions were encouraged and answered.

## 2017-10-05 ENCOUNTER — Other Ambulatory Visit: Payer: Self-pay | Admitting: Cardiovascular Disease

## 2017-10-05 NOTE — Telephone Encounter (Signed)
Please review for refill, Thanks !  

## 2017-10-20 ENCOUNTER — Ambulatory Visit: Payer: Medicare Other | Admitting: Physical Medicine & Rehabilitation

## 2017-10-28 ENCOUNTER — Encounter: Payer: Medicare Other | Attending: Physical Medicine & Rehabilitation | Admitting: Physical Medicine & Rehabilitation

## 2017-10-28 ENCOUNTER — Encounter: Payer: Self-pay | Admitting: Physical Medicine & Rehabilitation

## 2017-10-28 VITALS — BP 138/72 | HR 70

## 2017-10-28 DIAGNOSIS — R251 Tremor, unspecified: Secondary | ICD-10-CM | POA: Diagnosis not present

## 2017-10-28 DIAGNOSIS — M5416 Radiculopathy, lumbar region: Secondary | ICD-10-CM | POA: Diagnosis not present

## 2017-10-28 DIAGNOSIS — I5022 Chronic systolic (congestive) heart failure: Secondary | ICD-10-CM | POA: Insufficient documentation

## 2017-10-28 DIAGNOSIS — L89622 Pressure ulcer of left heel, stage 2: Secondary | ICD-10-CM

## 2017-10-28 DIAGNOSIS — R269 Unspecified abnormalities of gait and mobility: Secondary | ICD-10-CM | POA: Diagnosis present

## 2017-10-28 DIAGNOSIS — G4700 Insomnia, unspecified: Secondary | ICD-10-CM | POA: Diagnosis not present

## 2017-10-28 DIAGNOSIS — F329 Major depressive disorder, single episode, unspecified: Secondary | ICD-10-CM | POA: Insufficient documentation

## 2017-10-28 DIAGNOSIS — M47816 Spondylosis without myelopathy or radiculopathy, lumbar region: Secondary | ICD-10-CM | POA: Diagnosis not present

## 2017-10-28 DIAGNOSIS — M5126 Other intervertebral disc displacement, lumbar region: Secondary | ICD-10-CM | POA: Diagnosis not present

## 2017-10-28 DIAGNOSIS — M48061 Spinal stenosis, lumbar region without neurogenic claudication: Secondary | ICD-10-CM | POA: Diagnosis not present

## 2017-10-28 DIAGNOSIS — M1990 Unspecified osteoarthritis, unspecified site: Secondary | ICD-10-CM | POA: Diagnosis not present

## 2017-10-28 DIAGNOSIS — I110 Hypertensive heart disease with heart failure: Secondary | ICD-10-CM | POA: Diagnosis not present

## 2017-10-28 NOTE — Patient Instructions (Signed)
Continue the collagenase dressing to your heel until all of the yellow or necrotic tissue has gone away.  Then you can switch to a normal dressing that maintains slight moisture to the heel.

## 2017-10-28 NOTE — Progress Notes (Signed)
Subjective:    Patient ID: Lisa [name], female    DOB: 26-Oct-1926, 84 y.o.   MRN: [id number]  HPI   [name] is here in follow-up of her traumatic brain injury as well as her chronic low back pain.  Specifically we also are checking on her left heel wound.  She states that the wound seems to be improving.  She still has a nurse coming out and into the wound with the dressing as we described the last visit.  She does report that it has bled on occasion.  She denies pain in the heel and states it only bothers her if she bumps the heel on something hard.  Otherwise her back is feeling well.  She tries to stay active and mobile at home.  She was outside walking in the nice weather yesterday.  She has developed a UTI which are family doctor is treating.  Pain Inventory Average Pain 3 Pain Right Now 0 My pain is dull  In the last 24 hours, has pain interfered with the following? General activity 0 Relation with others 0 Enjoyment of life 0 What TIME of day is your pain at its worst? . Sleep (in general) Good  Pain is worse with: standing Pain improves with: . Relief from Meds: 7  Mobility walk with assistance use a walker ability to climb steps?  yes do you drive?  no  Function retired  Neuro/Psych loss of taste or smell  Prior Studies Any changes since last visit?  no  Physicians involved in your care Any changes since last visit?  no   Family History  Problem Relation Age of Onset  . COPD Mother   . High blood pressure Mother   . Lung disease Mother   . COPD Father   . Heart disease Father   . High blood pressure Father   . Heart attack Father   . Skin cancer Brother    Social History   Socioeconomic History  . Marital status: Widowed    Spouse name: Not on file  . Number of children: 1  . Years of education: 45  . Highest education level: Not on file  Social Needs  . Financial resource strain: Not on file  . Food insecurity - worry: Not on file  .  Food insecurity - inability: Not on file  . Transportation needs - medical: Not on file  . Transportation needs - non-medical: Not on file  Occupational History  . Occupation: retired  Tobacco Use  . Smoking status: Never Smoker  . Smokeless tobacco: Never Used  Substance and Sexual Activity  . Alcohol use: Yes    Alcohol/week: 0.0 oz    Comment: occasional wine  . Drug use: No  . Sexual activity: Not on file  Other Topics Concern  . Not on file  Social History Narrative   Admitted to [facility] [place] 05/15/17 - 06/19/17   Widowed   Never smoker   Occasionally drinks wine   DNR   Past Surgical History:  Procedure Laterality Date  . BACK SURGERY N/A   . BREAST LUMPECTOMY N/A   . FRACTURE SURGERY  08/25/2013   ORIF left distal radius fracture.  . WISDOM TOOTH EXTRACTION     Past Medical History:  Diagnosis Date  . Allergy   . Allergy to shellfish    causes swelling  . Brain bleed (HCC)   . Chronic back pain   . Chronic systolic CHF (congestive heart failure) (HCC)   .  Depression    unspecified  . Foot fracture    bilateral  . History of abnormal mammogram    Status post breast biopsy x 2, which were negative.   [name]. History of chicken pox   . History of sinus tachycardia 11/1997   With dyspnea on exertion. Workup included a VQ scan which was normal, and ultrasound of her lower extremities, which was normal with no evidence of DVT.  [name]. Hypertension   . Nocturnal leg cramps   . Osteoarthritis   . Osteoporosis   . Pelvis fracture (HCC)   . Peptic ulcer disease   . Renal disorder   . Spinal stenosis   . V-tach (HCC)   . Wrist fracture, right    There were no vitals taken for this visit.  Opioid Risk Score:   Fall Risk Score:  `1  Depression screen PHQ 2/9  Depression screen [facility] 08/24/2017 11/14/2016 10/22/2015 07/24/2015 07/16/2015 06/26/2015 05/22/2015  Decreased Interest 0 1 0 0 0 0 0  Down, Depressed, Hopeless 0 1 0 0 0 0 0  PHQ - 2 Score 0 2 0 0 0 0 0    Altered sleeping - 2 0 - - - -  Tired, decreased energy - 1 0 - - - -  Change in appetite - 0 0 - - - -  Feeling bad or failure about yourself  - 0 0 - - - -  Trouble concentrating - 0 0 - - - -  Moving slowly or fidgety/restless - 0 0 - - - -  Suicidal thoughts - 0 0 - - - -  PHQ-9 Score - 5 0 - - - -  Some recent data might be hidden     Review of Systems  Constitutional: Negative.   HENT: Negative.   Eyes: Negative.   Respiratory: Negative.   Cardiovascular: Negative.   Gastrointestinal: Negative.   Endocrine: Negative.   Genitourinary: Positive for dysuria.  Musculoskeletal: Positive for back pain and gait problem.  Skin: Negative.   Allergic/Immunologic: Negative.   Hematological: Negative.   Psychiatric/Behavioral: Negative.   All other systems reviewed and are negative.      Objective:   Physical Exam  HENT:  Head:NCAT.  Mouth/Throat: oral membranes moist Eyes:PERRL Neck:normal ROM Cardiovascular:Regular rate Respiratory:Normal effort [id number]:Nontender and soft Musculoskeletal:Mild low back pain with palpation.   Left low back and iliac crest somewhat tender to palpation.  There is no visible bruising or swelling .   Neurological: HOH,alert and oriented. Reasonable insight and awareness. Strength 4-5/5 in UE and 4-/5 prox LE to 4+/5 distally.  No tremors today.  Sensory exam intact. Skin:Heel wound is decreased to approximately a centimeter in diameter and area is 90% granulation tissue now.  (Last month this wound was 2.5 cm in diameter).  No drainage is noted from the wound.  Heel was nontender to palpation.  Mild discharge noted on dressing.   Psych:Patient is pleasant and appropriate as always   Assessment & Plan:  Medical Problem List and Plan: 1. 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  -pt near cognitive  baseline -continuesupervision at baseline 2. Left heel ulcer.  -continue collagenase for now.   -may change to regular dressing once fibronecrotic tissue is gone   -maintain pressure relief 3. Chronic back pain/Pain Management: lumbar post lami syndrome. Last [facility] 07/16/15 by Dr. Metta Clinesrisp.  - again,she has multiple potential pain generators as evidenced by this most recent CT. -  respondedto the L3-4 MBB's however.  -consider ESI in the future if pain recurs or worsens--currently her pain seems well controlled  4.Tremor:age related/TBI effect -continue strengthening efforts with PT -continuelow dose propranolol 10mg TID which has been quite beneficial  5. Insomnia, ?mild sundowning? -continuelow dose risperdal for sleep/mood, 0.25mg  qhs #30. 6. Right shoulder pain: likely RTC tendonitis.Somewhat improved 7. Depression: celexa  Follow up in  2 months. of face to face patient care time were spent during this visit. All questions were encouraged and answered.Greater than 50% of time during this encounter was spent counseling patient/family in regard to wound care education.

## 2017-11-16 ENCOUNTER — Other Ambulatory Visit: Payer: Self-pay | Admitting: Cardiovascular Disease

## 2017-11-20 ENCOUNTER — Other Ambulatory Visit: Payer: Self-pay | Admitting: Cardiovascular Disease

## 2017-11-20 ENCOUNTER — Other Ambulatory Visit: Payer: Self-pay | Admitting: Physical Medicine & Rehabilitation

## 2017-11-20 DIAGNOSIS — F5105 Insomnia due to other mental disorder: Principal | ICD-10-CM

## 2017-11-20 DIAGNOSIS — F409 Phobic anxiety disorder, unspecified: Secondary | ICD-10-CM

## 2017-11-23 ENCOUNTER — Ambulatory Visit: Payer: Medicare Other | Admitting: Physical Medicine & Rehabilitation

## 2017-12-17 ENCOUNTER — Other Ambulatory Visit: Payer: Self-pay | Admitting: Cardiovascular Disease

## 2017-12-17 NOTE — Telephone Encounter (Signed)
Please review for refill.  The patient has a past due follow up appointment & she is requesting a refill on Furosemide.

## 2017-12-17 NOTE — Telephone Encounter (Signed)
I do not see where Dr. Kirke [name] originally prescribed her lasix.  She sees Dr. Madelin RearBaboff, PCP, and unsure if he has adjusted any medications recently or of her current medication list. Patient is past due for 6 month follow up with Dr. Kirke [name]. Will route to Support Pool to contact patient for OV.

## 2017-12-23 ENCOUNTER — Encounter: Payer: Medicare Other | Attending: Physical Medicine & Rehabilitation | Admitting: Physical Medicine & Rehabilitation

## 2017-12-23 ENCOUNTER — Encounter: Payer: Self-pay | Admitting: Physical Medicine & Rehabilitation

## 2017-12-23 VITALS — BP 143/99 | HR 77 | Ht 62.0 in | Wt 187.0 lb

## 2017-12-23 DIAGNOSIS — M5126 Other intervertebral disc displacement, lumbar region: Secondary | ICD-10-CM | POA: Insufficient documentation

## 2017-12-23 DIAGNOSIS — S065X9A Traumatic subdural hemorrhage with loss of consciousness of unspecified duration, initial encounter: Secondary | ICD-10-CM | POA: Diagnosis not present

## 2017-12-23 DIAGNOSIS — I5022 Chronic systolic (congestive) heart failure: Secondary | ICD-10-CM | POA: Insufficient documentation

## 2017-12-23 DIAGNOSIS — I110 Hypertensive heart disease with heart failure: Secondary | ICD-10-CM | POA: Diagnosis not present

## 2017-12-23 DIAGNOSIS — R269 Unspecified abnormalities of gait and mobility: Secondary | ICD-10-CM | POA: Insufficient documentation

## 2017-12-23 DIAGNOSIS — L89622 Pressure ulcer of left heel, stage 2: Secondary | ICD-10-CM | POA: Diagnosis not present

## 2017-12-23 DIAGNOSIS — M47816 Spondylosis without myelopathy or radiculopathy, lumbar region: Secondary | ICD-10-CM | POA: Insufficient documentation

## 2017-12-23 DIAGNOSIS — F329 Major depressive disorder, single episode, unspecified: Secondary | ICD-10-CM | POA: Diagnosis not present

## 2017-12-23 DIAGNOSIS — M48061 Spinal stenosis, lumbar region without neurogenic claudication: Secondary | ICD-10-CM | POA: Insufficient documentation

## 2017-12-23 DIAGNOSIS — G4700 Insomnia, unspecified: Secondary | ICD-10-CM | POA: Diagnosis not present

## 2017-12-23 DIAGNOSIS — S065XAA Traumatic subdural hemorrhage with loss of consciousness status unknown, initial encounter: Secondary | ICD-10-CM

## 2017-12-23 DIAGNOSIS — M1990 Unspecified osteoarthritis, unspecified site: Secondary | ICD-10-CM | POA: Insufficient documentation

## 2017-12-23 NOTE — Progress Notes (Signed)
Subjective:    Patient ID: Lisa [name], female    DOB: Aug 16, 1927, 84 y.o.   MRN: [id number]  HPI   Lisa [name] is here in follow up of her chronic pain and gait disorder. Her pain levels are improved. Her pcp recommended that she get a rollator to help get her walking more and to "improve her posture". Her back is bothering her at times but is tolerable. She reports that her heel sore has heeled and essentially is a callus now. It does not hurt. She gently scrubs it when she showers.   She has had more wheezing and occasional shortness of breath at times. Her pcp recently rxed a duoneb inhaler as well.  She states that her tremors have been under control.  She should be on propranolol to help maintain.  Mood has been positive.  She continues to have 24-hour caregivers who are very diligent regarding her needs..    Pain Inventory Average Pain 3 Pain Right Now 0 My pain is aching  In the last 24 hours, has pain interfered with the following? General activity 0 Relation with others 0 Enjoyment of life 0 What TIME of day is your pain at its worst? na Sleep (in general) Poor  Pain is worse with: sitting and standing Pain improves with: rest Relief from Meds: na  Mobility walk with assistance use a walker ability to climb steps?  yes do you drive?  no  Function retired  Neuro/Psych loss of taste or smell  Prior Studies Any changes since last visit?  no  Physicians involved in your care Any changes since last visit?  no   Family History  Problem Relation Age of Onset  . COPD Mother   . High blood pressure Mother   . Lung disease Mother   . COPD Father   . Heart disease Father   . High blood pressure Father   . Heart attack Father   . Skin cancer Brother    Social History   Socioeconomic History  . Marital status: Widowed    Spouse name: Not on file  . Number of children: 1  . Years of education: 34  . Highest education level: Not on file  Occupational  History  . Occupation: retired  Engineer, production  . Financial resource strain: Not on file  . Food insecurity:    Worry: Not on file    Inability: Not on file  . Transportation needs:    Medical: Not on file    Non-medical: Not on file  Tobacco Use  . Smoking status: Never Smoker  . Smokeless tobacco: Never Used  Substance and Sexual Activity  . Alcohol use: Yes    Alcohol/week: 0.0 oz    Comment: occasional wine  . Drug use: No  . Sexual activity: Not on file  Lifestyle  . Physical activity:    Days per week: Not on file    Minutes per session: Not on file  . Stress: Not on file  Relationships  . Social connections:    Talks on phone: Not on file    Gets together: Not on file    Attends religious service: Not on file    Active member of club or organization: Not on file    Attends meetings of clubs or organizations: Not on file    Relationship status: Not on file  Other Topics Concern  . Not on file  Social History Narrative   Admitted to [facility] 05/15/17 -  06/19/17   Widowed   Never smoker   Occasionally drinks wine   DNR   Past Surgical History:  Procedure Laterality Date  . BACK SURGERY N/A   . BREAST LUMPECTOMY N/A   . FRACTURE SURGERY  08/25/2013   ORIF left distal radius fracture.  . WISDOM TOOTH EXTRACTION     Past Medical History:  Diagnosis Date  . Allergy   . Allergy to shellfish    causes swelling  . Brain bleed (HCC)   . Chronic back pain   . Chronic systolic CHF (congestive heart failure) (HCC)   . Depression    unspecified  . Foot fracture    bilateral  . History of abnormal mammogram    Status post breast biopsy x 2, which were negative.   [name] History of chicken pox   . History of sinus tachycardia 11/1997   With dyspnea on exertion. Workup included a VQ scan which was normal, and ultrasound of her lower extremities, which was normal with no evidence of DVT.  [name] Hypertension   . Nocturnal leg cramps   . Osteoarthritis   .  Osteoporosis   . Pelvis fracture (HCC)   . Peptic ulcer disease   . Renal disorder   . Spinal stenosis   . V-tach (HCC)   . Wrist fracture, right    BP (!) 143/99   Pulse 77   Ht 5\' 2"  (1.575 m) Comment: stated  Wt 187 lb (84.8 kg)   SpO2 95%   BMI 34.20 kg/m   Opioid Risk Score:   Fall Risk Score:  `1  Depression screen PHQ 2/9  Depression screen [facility] 2/9 08/24/2017 11/14/2016 10/22/2015 07/24/2015 07/16/2015 06/26/2015 05/22/2015  Decreased Interest 0 1 0 0 0 0 0  Down, Depressed, Hopeless 0 1 0 0 0 0 0  PHQ - 2 Score 0 2 0 0 0 0 0  Altered sleeping - 2 0 - - - -  Tired, decreased energy - 1 0 - - - -  Change in appetite - 0 0 - - - -  Feeling bad or failure about yourself  - 0 0 - - - -  Trouble concentrating - 0 0 - - - -  Moving slowly or fidgety/restless - 0 0 - - - -  Suicidal thoughts - 0 0 - - - -  PHQ-9 Score - 5 0 - - - -  Some recent data might be hidden     Review of Systems  Constitutional: Negative.   HENT: Negative.   Eyes: Negative.   Respiratory: Positive for apnea and shortness of breath.   Cardiovascular: Negative.   Gastrointestinal: Positive for diarrhea.  Endocrine: Negative.   Genitourinary: Negative.   Musculoskeletal: Negative.   Skin: Negative.   Allergic/Immunologic: Negative.   Neurological: Negative.   Hematological: Negative.   Psychiatric/Behavioral: Negative.   All other systems reviewed and are negative.      Objective:   Physical Exam General: No acute distress HEENT: EOMI, oral membranes moist Cards: reg rate  Chest: normal effort Abdomen: Soft, NT, ND Skin: dry, intact. Did not examine heel.  Extremities: no edema  Musculoskeletal:Mild low back pain with palpation.Left low back and iliac crest somewhat tender to palpation. There is no visible bruising or swelling . Neurological: HOH,alert and oriented. Reasonable insight and awareness. Strength 4-5/5 in UE and 4-/5 prox LE to 4+/5 distally.    Motor and  sensory exam is stable.  Patient rises easily from the table to  stand and uses good technique when transferring.  Sensory exam is unchanged.  No tremors are seen today.   Skin:Patient is wearing shoes today.  Heel wound has healed and is a callus only now.  Area is nontender. Psych:Patient is pleasant and appropriate   Assessment & Plan:  Medical Problem List and Plan: 1. 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  -pt near cognitive baseline -continuesupervision at baseline           -needs to be careful with rollator.  Reviewed with patient and caregiver today 2. Left heel ulcer.   -callus only now         -allow to fall off on it's own          -pressure relief. 3. Chronic back pain/Pain Management: lumbar post lami syndrome. [facility] Dr. Metta Clinesrisp.  - again,she has multiple potential pain generators as evidenced by this most recent CT. -respondedto the L3-4 MBB's however.  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 Insomnia, ?mild sundowning? -continuelow dose risperdal for sleep/mood, 0.25mg  qhs #30. 6. Right shoulder pain/RTC tendonitis---maintain HEP 7. Depression: celexa  Follow up in 4 months.15 minutes of face-to-face patient care time was spent during this visit today.  All questions were encouraged and answered.

## 2017-12-23 NOTE — Patient Instructions (Signed)
CONTINUE TO WORK ON YOUR EXERCISE AND ACTIVITY.  BE CAREFUL WITH THE ROLLATOR AS IT PERTAINS TO BALANCE.    PLEASE FEEL FREE TO CALL OUR OFFICE WITH ANY PROBLEMS OR QUESTIONS [phone])

## 2018-01-06 ENCOUNTER — Other Ambulatory Visit: Payer: Self-pay | Admitting: Cardiovascular Disease

## 2018-01-15 ENCOUNTER — Emergency Department: Payer: Medicare Other

## 2018-01-15 ENCOUNTER — Other Ambulatory Visit: Payer: Self-pay

## 2018-01-15 ENCOUNTER — Inpatient Hospital Stay
Admission: EM | Admit: 2018-01-15 | Discharge: 2018-01-19 | DRG: 595 | Disposition: A | Discharge status: Skilled Nursing Facility | Payer: Medicare Other | Attending: Internal Medicine | Admitting: Internal Medicine

## 2018-01-15 DIAGNOSIS — F329 Major depressive disorder, single episode, unspecified: Secondary | ICD-10-CM | POA: Diagnosis present

## 2018-01-15 DIAGNOSIS — G8929 Other chronic pain: Secondary | ICD-10-CM | POA: Diagnosis present

## 2018-01-15 DIAGNOSIS — B029 Zoster without complications: Secondary | ICD-10-CM | POA: Diagnosis present

## 2018-01-15 DIAGNOSIS — M549 Dorsalgia, unspecified: Secondary | ICD-10-CM | POA: Diagnosis present

## 2018-01-15 DIAGNOSIS — R05 Cough: Secondary | ICD-10-CM | POA: Diagnosis present

## 2018-01-15 DIAGNOSIS — A0839 Other viral enteritis: Secondary | ICD-10-CM | POA: Diagnosis present

## 2018-01-15 DIAGNOSIS — M623 Immobility syndrome (paraplegic): Secondary | ICD-10-CM | POA: Diagnosis present

## 2018-01-15 DIAGNOSIS — Z91013 Allergy to seafood: Secondary | ICD-10-CM | POA: Diagnosis not present

## 2018-01-15 DIAGNOSIS — I130 Hypertensive heart and chronic kidney disease with heart failure and stage 1 through stage 4 chronic kidney disease, or unspecified chronic kidney disease: Secondary | ICD-10-CM | POA: Diagnosis present

## 2018-01-15 DIAGNOSIS — Z885 Allergy status to narcotic agent status: Secondary | ICD-10-CM

## 2018-01-15 DIAGNOSIS — Z66 Do not resuscitate: Secondary | ICD-10-CM | POA: Diagnosis present

## 2018-01-15 DIAGNOSIS — Z8249 Family history of ischemic heart disease and other diseases of the circulatory system: Secondary | ICD-10-CM | POA: Diagnosis not present

## 2018-01-15 DIAGNOSIS — M810 Age-related osteoporosis without current pathological fracture: Secondary | ICD-10-CM | POA: Diagnosis present

## 2018-01-15 DIAGNOSIS — R251 Tremor, unspecified: Secondary | ICD-10-CM

## 2018-01-15 DIAGNOSIS — Z7901 Long term (current) use of anticoagulants: Secondary | ICD-10-CM

## 2018-01-15 DIAGNOSIS — G9341 Metabolic encephalopathy: Secondary | ICD-10-CM | POA: Diagnosis present

## 2018-01-15 DIAGNOSIS — N184 Chronic kidney disease, stage 4 (severe): Secondary | ICD-10-CM | POA: Diagnosis present

## 2018-01-15 DIAGNOSIS — I5022 Chronic systolic (congestive) heart failure: Secondary | ICD-10-CM | POA: Diagnosis present

## 2018-01-15 DIAGNOSIS — L89620 Pressure ulcer of left heel, unstageable: Secondary | ICD-10-CM | POA: Diagnosis present

## 2018-01-15 DIAGNOSIS — Z79899 Other long term (current) drug therapy: Secondary | ICD-10-CM | POA: Diagnosis not present

## 2018-01-15 DIAGNOSIS — Z8673 Personal history of transient ischemic attack (TIA), and cerebral infarction without residual deficits: Secondary | ICD-10-CM

## 2018-01-15 DIAGNOSIS — R7989 Other specified abnormal findings of blood chemistry: Secondary | ICD-10-CM

## 2018-01-15 DIAGNOSIS — R778 Other specified abnormalities of plasma proteins: Secondary | ICD-10-CM

## 2018-01-15 DIAGNOSIS — Z888 Allergy status to other drugs, medicaments and biological substances status: Secondary | ICD-10-CM

## 2018-01-15 DIAGNOSIS — R531 Weakness: Secondary | ICD-10-CM

## 2018-01-15 DIAGNOSIS — Z79890 Hormone replacement therapy (postmenopausal): Secondary | ICD-10-CM | POA: Diagnosis not present

## 2018-01-15 DIAGNOSIS — R748 Abnormal levels of other serum enzymes: Secondary | ICD-10-CM | POA: Diagnosis present

## 2018-01-15 LAB — COMPREHENSIVE METABOLIC PANEL
ALK PHOS: 62 U/L (ref 38–126)
ALT: 41 U/L (ref 14–54)
ANION GAP: 10 (ref 5–15)
AST: 62 U/L — ABNORMAL HIGH (ref 15–41)
Albumin: 4 g/dL (ref 3.5–5.0)
BUN: 32 mg/dL — ABNORMAL HIGH (ref 6–20)
CALCIUM: 9.2 mg/dL (ref 8.9–10.3)
CO2: 22 mmol/L (ref 22–32)
CREATININE: 2.19 mg/dL — AB (ref 0.44–1.00)
Chloride: 105 mmol/L (ref 101–111)
GFR calc Af Amer: 22 mL/min — ABNORMAL LOW (ref >60)
GFR, EST NON AFRICAN AMERICAN: 19 mL/min — AB (ref >60)
Glucose, Bld: 104 mg/dL — ABNORMAL HIGH (ref 65–99)
Potassium: 4.5 mmol/L (ref 3.5–5.1)
SODIUM: 137 mmol/L (ref 135–145)
TOTAL PROTEIN: 7.6 g/dL (ref 6.5–8.1)
Total Bilirubin: 0.2 mg/dL — ABNORMAL LOW (ref 0.3–1.2)

## 2018-01-15 LAB — URINALYSIS, COMPLETE (UACMP) WITH MICROSCOPIC
BILIRUBIN URINE: NEGATIVE
Bacteria, UA: NONE SEEN
GLUCOSE, UA: NEGATIVE mg/dL
Hgb urine dipstick: NEGATIVE
Ketones, ur: NEGATIVE mg/dL
LEUKOCYTES UA: NEGATIVE
NITRITE: NEGATIVE
PH: 5 (ref 5.0–8.0)
Protein, ur: NEGATIVE mg/dL
SPECIFIC GRAVITY, URINE: 1.014 (ref 1.005–1.030)
Squamous Epithelial / LPF: NONE SEEN (ref 0–5)

## 2018-01-15 LAB — CBC WITH DIFFERENTIAL/PLATELET
BASOS ABS: 0.1 10*3/uL (ref 0–0.1)
BASOS PCT: 1 %
EOS ABS: 0.2 10*3/uL (ref 0–0.7)
EOS PCT: 2 %
HCT: 35 % (ref 35.0–47.0)
HEMOGLOBIN: 11.9 g/dL — AB (ref 12.0–16.0)
Lymphocytes Relative: 13 %
Lymphs Abs: 1.1 10*3/uL (ref 1.0–3.6)
MCH: 35.8 pg — ABNORMAL HIGH (ref 26.0–34.0)
MCHC: 33.9 g/dL (ref 32.0–36.0)
MCV: 105.5 fL — ABNORMAL HIGH (ref 80.0–100.0)
Monocytes Absolute: 0.8 10*3/uL (ref 0.2–0.9)
Monocytes Relative: 10 %
NEUTROS PCT: 74 %
Neutro Abs: 5.9 10*3/uL (ref 1.4–6.5)
PLATELETS: 189 10*3/uL (ref 150–440)
RBC: 3.32 MIL/uL — AB (ref 3.80–5.20)
RDW: 14.2 % (ref 11.5–14.5)
WBC: 8.1 10*3/uL (ref 3.6–11.0)

## 2018-01-15 LAB — TROPONIN I
TROPONIN I: 0.06 ng/mL — AB (ref <0.03)
Troponin I: 0.13 ng/mL (ref <0.03)

## 2018-01-15 LAB — LIPASE, BLOOD: LIPASE: 83 U/L — AB (ref 11–51)

## 2018-01-15 MED ORDER — ONDANSETRON HCL 4 MG/2ML IJ SOLN: 4.0000 mg | Freq: Four times a day (QID) | INTRAMUSCULAR | Status: DC | PRN

## 2018-01-15 MED ORDER — ONDANSETRON HCL 4 MG PO TABS: 4.0000 mg | ORAL_TABLET | Freq: Four times a day (QID) | ORAL | Status: DC | PRN

## 2018-01-15 MED ORDER — FERROUS SULFATE 325 (65 FE) MG PO TABS
325.0000 mg | ORAL_TABLET | Freq: Two times a day (BID) | ORAL | Status: DC
  Administered 2018-01-16 – 2018-01-19 (×8): 325 mg via ORAL
  Filled 2018-01-15 (×8): qty 1

## 2018-01-15 MED ORDER — ACETAMINOPHEN 650 MG RE SUPP: 650.0000 mg | Freq: Four times a day (QID) | RECTAL | Status: DC | PRN

## 2018-01-15 MED ORDER — VITAMIN D 1000 UNITS PO TABS
1000.0000 [IU] | ORAL_TABLET | Freq: Every day | ORAL | Status: DC
  Administered 2018-01-16 – 2018-01-19 (×4): 1000 [IU] via ORAL
  Filled 2018-01-15 (×4): qty 1

## 2018-01-15 MED ORDER — SODIUM CHLORIDE 0.9 % IV BOLUS
500.0000 mL | Freq: Once | INTRAVENOUS | Status: AC
  Administered 2018-01-15: 500 mL via INTRAVENOUS

## 2018-01-15 MED ORDER — ALUM & MAG HYDROXIDE-SIMETH 200-200-20 MG/5ML PO SUSP: 30.0000 mL | ORAL | Status: DC | PRN

## 2018-01-15 MED ORDER — HYDROCODONE-ACETAMINOPHEN 5-325 MG PO TABS
1.0000 | ORAL_TABLET | ORAL | Status: DC | PRN
  Administered 2018-01-16 – 2018-01-19 (×6): 2 via ORAL
  Filled 2018-01-15: qty 2
  Filled 2018-01-15: qty 1
  Filled 2018-01-15 (×5): qty 2

## 2018-01-15 MED ORDER — METOPROLOL TARTRATE 25 MG PO TABS
12.5000 mg | ORAL_TABLET | Freq: Two times a day (BID) | ORAL | Status: DC
  Administered 2018-01-16 – 2018-01-19 (×8): 12.5 mg via ORAL
  Filled 2018-01-15 (×8): qty 1

## 2018-01-15 MED ORDER — CITALOPRAM HYDROBROMIDE 20 MG PO TABS
20.0000 mg | ORAL_TABLET | Freq: Every day | ORAL | Status: DC
  Administered 2018-01-16 – 2018-01-19 (×4): 20 mg via ORAL
  Filled 2018-01-15 (×4): qty 1

## 2018-01-15 MED ORDER — POLYETHYL GLYCOL-PROPYL GLYCOL 0.4-0.3 % OP SOLN: 1.0000 [drp] | OPHTHALMIC | Status: DC | PRN

## 2018-01-15 MED ORDER — BISACODYL 5 MG PO TBEC: 5.0000 mg | DELAYED_RELEASE_TABLET | Freq: Every day | ORAL | Status: DC | PRN

## 2018-01-15 MED ORDER — PRO-STAT SUGAR FREE PO LIQD
30.0000 mL | Freq: Two times a day (BID) | ORAL | Status: DC
  Administered 2018-01-16 – 2018-01-17 (×3): 30 mL via ORAL

## 2018-01-15 MED ORDER — VALACYCLOVIR HCL 500 MG PO TABS
1000.0000 mg | ORAL_TABLET | Freq: Two times a day (BID) | ORAL | Status: DC
  Administered 2018-01-16 – 2018-01-18 (×6): 1000 mg via ORAL
  Filled 2018-01-15 (×6): qty 2

## 2018-01-15 MED ORDER — PANTOPRAZOLE SODIUM 40 MG PO TBEC
40.0000 mg | DELAYED_RELEASE_TABLET | Freq: Every day | ORAL | Status: DC
  Administered 2018-01-16 – 2018-01-19 (×4): 40 mg via ORAL
  Filled 2018-01-15 (×4): qty 1

## 2018-01-15 MED ORDER — APIXABAN 2.5 MG PO TABS
2.5000 mg | ORAL_TABLET | Freq: Two times a day (BID) | ORAL | Status: DC
  Administered 2018-01-16 – 2018-01-19 (×8): 2.5 mg via ORAL
  Filled 2018-01-15 (×8): qty 1

## 2018-01-15 MED ORDER — AMIODARONE HCL 200 MG PO TABS
100.0000 mg | ORAL_TABLET | Freq: Every day | ORAL | Status: DC
  Administered 2018-01-16 – 2018-01-19 (×4): 100 mg via ORAL
  Filled 2018-01-15 (×4): qty 1

## 2018-01-15 MED ORDER — OCUVITE-LUTEIN PO CAPS
1.0000 | ORAL_CAPSULE | Freq: Two times a day (BID) | ORAL | Status: DC
  Administered 2018-01-16 – 2018-01-19 (×8): 1 via ORAL
  Filled 2018-01-15 (×9): qty 1

## 2018-01-15 MED ORDER — ACETAMINOPHEN 325 MG PO TABS
650.0000 mg | ORAL_TABLET | Freq: Four times a day (QID) | ORAL | Status: DC | PRN
  Administered 2018-01-18: 650 mg via ORAL
  Filled 2018-01-15: qty 2

## 2018-01-15 MED ORDER — VITAMIN C 500 MG PO TABS
500.0000 mg | ORAL_TABLET | Freq: Every day | ORAL | Status: DC
  Administered 2018-01-17 – 2018-01-19 (×3): 500 mg via ORAL
  Filled 2018-01-15 (×4): qty 1

## 2018-01-15 MED ORDER — LEVOTHYROXINE SODIUM 50 MCG PO TABS
50.0000 ug | ORAL_TABLET | Freq: Every day | ORAL | Status: DC
  Administered 2018-01-16 – 2018-01-19 (×4): 50 ug via ORAL
  Filled 2018-01-15 (×4): qty 1

## 2018-01-15 MED ORDER — SODIUM CHLORIDE 0.9 % IV SOLN
INTRAVENOUS | Status: DC
  Administered 2018-01-15 – 2018-01-17 (×2): via INTRAVENOUS

## 2018-01-15 MED ORDER — ONDANSETRON HCL 4 MG/2ML IJ SOLN
4.0000 mg | Freq: Once | INTRAMUSCULAR | Status: AC
  Administered 2018-01-15: 4 mg via INTRAVENOUS
  Filled 2018-01-15: qty 2

## 2018-01-15 MED ORDER — LORATADINE 10 MG PO TABS
10.0000 mg | ORAL_TABLET | Freq: Every day | ORAL | Status: DC
  Administered 2018-01-16 – 2018-01-19 (×4): 10 mg via ORAL
  Filled 2018-01-15 (×4): qty 1

## 2018-01-15 MED ORDER — TRAZODONE HCL 50 MG PO TABS: 25.0000 mg | ORAL_TABLET | Freq: Every evening | ORAL | Status: DC | PRN

## 2018-01-15 MED ORDER — IRBESARTAN 150 MG PO TABS
150.0000 mg | ORAL_TABLET | Freq: Every day | ORAL | Status: DC
  Administered 2018-01-16 – 2018-01-19 (×4): 150 mg via ORAL
  Filled 2018-01-15 (×4): qty 1

## 2018-01-15 MED ORDER — RALOXIFENE HCL 60 MG PO TABS
60.0000 mg | ORAL_TABLET | Freq: Every day | ORAL | Status: DC
  Administered 2018-01-16 – 2018-01-19 (×4): 60 mg via ORAL
  Filled 2018-01-15 (×4): qty 1

## 2018-01-15 MED ORDER — RISPERIDONE 0.25 MG PO TABS
0.2500 mg | ORAL_TABLET | Freq: Every day | ORAL | Status: DC
  Administered 2018-01-16 – 2018-01-18 (×4): 0.25 mg via ORAL
  Filled 2018-01-15 (×5): qty 1

## 2018-01-15 MED ORDER — MONTELUKAST SODIUM 10 MG PO TABS
10.0000 mg | ORAL_TABLET | Freq: Every evening | ORAL | Status: DC
  Administered 2018-01-16 – 2018-01-18 (×3): 10 mg via ORAL
  Filled 2018-01-15 (×3): qty 1

## 2018-01-15 MED ORDER — VITAMIN B-12 100 MCG PO TABS
100.0000 ug | ORAL_TABLET | Freq: Every day | ORAL | Status: DC
  Administered 2018-01-16 – 2018-01-19 (×4): 100 ug via ORAL
  Filled 2018-01-15 (×4): qty 1

## 2018-01-15 MED ORDER — MIRTAZAPINE 15 MG PO TABS
15.0000 mg | ORAL_TABLET | Freq: Every day | ORAL | Status: DC
  Administered 2018-01-16 – 2018-01-18 (×4): 15 mg via ORAL
  Filled 2018-01-15 (×4): qty 1

## 2018-01-15 MED ORDER — ALBUTEROL SULFATE (2.5 MG/3ML) 0.083% IN NEBU
2.5000 mg | INHALATION_SOLUTION | Freq: Four times a day (QID) | RESPIRATORY_TRACT | Status: DC | PRN
Start: 2018-01-15 — End: 2018-01-19
  Administered 2018-01-18: 2.5 mg via RESPIRATORY_TRACT
  Filled 2018-01-15: qty 3

## 2018-01-15 MED ORDER — PRIMIDONE 50 MG PO TABS
50.0000 mg | ORAL_TABLET | Freq: Two times a day (BID) | ORAL | Status: DC
  Administered 2018-01-16 – 2018-01-19 (×8): 50 mg via ORAL
  Filled 2018-01-15 (×10): qty 1

## 2018-01-15 MED ORDER — PROPRANOLOL HCL 10 MG PO TABS: 10.0000 mg | ORAL_TABLET | Freq: Three times a day (TID) | ORAL | Status: DC

## 2018-01-15 MED ORDER — DOCUSATE SODIUM 100 MG PO CAPS
100.0000 mg | ORAL_CAPSULE | Freq: Two times a day (BID) | ORAL | Status: DC
  Administered 2018-01-16 – 2018-01-19 (×7): 100 mg via ORAL
  Filled 2018-01-15 (×7): qty 1

## 2018-01-15 NOTE — ED Triage Notes (Addendum)
Dx with shingles X 3-4 days ago, started antiviral yesterday. Sent to be evaluated for generalized weakness. Pt alert and oriented X4, active, cooperative, pt in NAD. RR even and unlabored, color WNL.   cbg and vs WNL with EMS  From home

## 2018-01-15 NOTE — ED Notes (Signed)
Date and time results received: 01/15/18 5:58 PM   Test: troponin 0.06   Name of Provider Notified: Siadecki

## 2018-01-15 NOTE — ED Notes (Signed)
Report to Rebecca, RN.

## 2018-01-15 NOTE — ED Notes (Signed)
Pt adult diaper checked again at this time by this RN and found dry.

## 2018-01-15 NOTE — H&P (Signed)
[facility] [place] at [facility]   PATIENT NAME: Lisa [name]    MR#:  [id number]  DATE OF BIRTH:  [birth year]  DATE OF ADMISSION:  01/15/2018  PRIMARY CARE PHYSICIAN: Kandyce Rud, MD   REQUESTING/REFERRING PHYSICIAN:   CHIEF COMPLAINT:   Chief Complaint  Patient presents with  . Weakness  . Herpes Zoster    HISTORY OF PRESENT ILLNESS: Lisa [name]  is a 84 y.o. female with a known history of chronic back pain, chronic systolic CHF, hypertension, osteoporosis and CKD4. Patient is unable to provide history due to confusion.  Most information was taken from reviewing the medical records and from discussion with emergency room physician and with the family. Patient was brought to emergency room for acute onset severe fatigue and generalized weakness noticed since yesterday, gradually getting worse.  Patient was seen by her primary care physician yesterday and diagnosed with shingles, for which she was prescribed Valtrex.  Patient only got the chance to take 1 tablet so far.  She has a painful rash with blisters at the right flank area.  Per family, patient is usually alert and ambulating, but she has not been able to walk due to generalized weakness in the past 24 hours.  She was also noted with diarrhea, earlier today.  No fever, chills, no vomiting, no bleeding, no chest pain. Blood test done emergency room were notable for slightly elevated troponin level, first one at 0.06 and the second one 0.13.  Creatinine level is 2.19, which is actually at baseline for this patient. EKG and chest x-ray, reviewed by myself, and noted to be unremarkable. Patient is admitted for further evaluation and treatment.    PAST MEDICAL HISTORY:   Past Medical History:  Diagnosis Date  . Allergy   . Allergy to shellfish    causes swelling  . Brain bleed (HCC)   . Chronic back pain   . Chronic systolic CHF (congestive heart failure) (HCC)   . Depression    unspecified  . Foot  fracture    bilateral  . History of abnormal mammogram    Status post breast biopsy x 2, which were negative.   [name] History of chicken pox   . History of sinus tachycardia 11/1997   With dyspnea on exertion. Workup included a VQ scan which was normal, and ultrasound of her lower extremities, which was normal with no evidence of DVT.  [name] Hypertension   . Nocturnal leg cramps   . Osteoarthritis   . Osteoporosis   . Pelvis fracture (HCC)   . Peptic ulcer disease   . Renal disorder   . Spinal stenosis   . V-tach (HCC)   . Wrist fracture, right     PAST SURGICAL HISTORY:  Past Surgical History:  Procedure Laterality Date  . BACK SURGERY N/A   . BREAST LUMPECTOMY N/A   . FRACTURE SURGERY  08/25/2013   ORIF left distal radius fracture.  . WISDOM TOOTH EXTRACTION      SOCIAL HISTORY:  Social History   Tobacco Use  . Smoking status: Never Smoker  . Smokeless tobacco: Never Used  Substance Use Topics  . Alcohol use: Yes    Alcohol/week: 0.0 oz    Comment: occasional wine    FAMILY HISTORY:  Family History  Problem Relation Age of Onset  . COPD Mother   . High blood pressure Mother   . Lung disease Mother   . COPD Father   . Heart disease Father   .  High blood pressure Father   . Heart attack Father   . Skin cancer Brother     DRUG ALLERGIES:  Allergies  Allergen Reactions  . Ambien [Zolpidem Tartrate] Other (See Comments)    Reaction:  Hallucinations   . Daypro [Oxaprozin] Other (See Comments)    GI upset  . Oxycodone Nausea And Vomiting  . Shellfish Allergy Swelling and Other (See Comments)    Pts mouth and face swells.   . Ace Inhibitors Cough  . Norvasc [Amlodipine Besylate] Palpitations    REVIEW OF SYSTEMS:   CONSTITUTIONAL: No fever, but patient complains of severe fatigue and generalized weakness.  EYES: No blurred or double vision.  EARS, NOSE, AND THROAT: No tinnitus or ear pain.  RESPIRATORY: No cough, shortness of breath, wheezing or hemoptysis.   CARDIOVASCULAR: No chest pain, orthopnea, edema.  GASTROINTESTINAL: Positive for nausea and diarrhea; no abdominal pain.  GENITOURINARY: No dysuria, hematuria.  ENDOCRINE: No polyuria, nocturia,  HEMATOLOGY: No bleeding SKIN: Right flank area is noted with painful, erythematosus rash; several blisters are seen. MUSCULOSKELETAL: No joint pain or arthritis.   NEUROLOGIC: No focal weakness.  PSYCHIATRY: No anxiety or depression.   MEDICATIONS AT HOME:  Prior to Admission medications   Medication Sig Start Date End Date Taking? Authorizing Provider  acetaminophen (TYLENOL) 325 MG tablet Take 650 mg by mouth every 6 (six) hours as needed.   Yes [provider]  albuterol (PROVENTIL HFA;VENTOLIN HFA) 108 (90 Base) MCG/ACT inhaler Inhale 2 puffs into the lungs every 6 (six) hours as needed for wheezing or shortness of breath.   Yes [provider]  alum & mag hydroxide-simeth (MAALOX PLUS) 400-400-40 MG/5ML suspension Take 30 mLs by mouth every 4 (four) hours as needed for indigestion.   Yes [provider]  Amino Acids-Protein Hydrolys (FEEDING SUPPLEMENT, PRO-STAT SUGAR FREE 64,) LIQD Take 30 mLs by mouth 2 (two) times daily between meals.   Yes [provider]  amiodarone (PACERONE) 200 MG tablet TAKE 1/2 TABLET BY MOUTH DAILY Patient taking differently: Take  tablet (100MG ) by mouth every day 01/06/18  Yes Iran OuchArida, Muhammad A, MD  Cholecalciferol 1000 units tablet Take 1 capsule by mouth daily.    Yes [provider]  citalopram (CELEXA) 20 MG tablet Take 20 mg by mouth daily.    Yes [provider]  Cyanocobalamin (B-12) 1000 MCG TABS Take 1 tablet by mouth daily.    Yes [provider]  ELIQUIS 2.5 MG TABS tablet TAKE ONE TABLET TWICE DAILY 10/05/17  Yes Iran OuchArida, Muhammad A, MD  ferrous sulfate 325 (65 FE) MG EC tablet Take 325 mg by mouth 2 (two) times daily.   Yes [provider]  furosemide (LASIX) 20 MG tablet Take 20 mg  by mouth daily.    Yes [provider]  irbesartan (AVAPRO) 150 MG tablet Take 150 mg by mouth daily.    Yes [provider]  levothyroxine (SYNTHROID, LEVOTHROID) 50 MCG tablet Take 50 mcg by mouth daily before breakfast.    Yes [provider]  loratadine (CLARITIN) 10 MG tablet Take 1 tablet (10 mg total) by mouth daily. 07/06/17  Yes Leslye PeerByrum, Robert S, MD  metoprolol tartrate (LOPRESSOR) 25 MG tablet Take 12.5 mg by mouth 2 (two) times daily.  02/11/16  Yes [provider]  mirtazapine (REMERON) 15 MG tablet Take 15 mg by mouth at bedtime.  02/11/16  Yes [provider]  montelukast (SINGULAIR) 10 MG tablet Take 10 mg by mouth  every evening.   Yes [provider]  Multiple Vitamins-Minerals (ICAPS AREDS 2) CAPS Take 1 capsule by mouth 2 (two) times daily.   Yes [provider]  omeprazole (PRILOSEC) 20 MG capsule Take 1 capsule by mouth daily.  10/05/15  Yes [provider]  Polyethyl Glycol-Propyl Glycol (SYSTANE) 0.4-0.3 % SOLN Place 1 drop into both eyes as needed.    Yes [provider]  primidone (MYSOLINE) 50 MG tablet Take 50 mg by mouth 2 (two) times daily.   Yes [provider]  raloxifene (EVISTA) 60 MG tablet Take 1 tablet by mouth daily.  10/05/15  Yes [provider]  risperiDONE (RISPERDAL) 0.25 MG tablet TAKE 1 TABLET BY MOUTH AT BEDTIME 11/20/17  Yes Ranelle Oyster, MD  triamcinolone cream (KENALOG) 0.1 % Apply 1 application topically 2 (two) times daily. 01/14/18 01/14/19 Yes [provider]  valACYclovir (VALTREX) 1000 MG tablet Take 1 tablet by mouth 3 (three) times daily. 01/14/18 01/21/18 Yes [provider]  vitamin C (ASCORBIC ACID) 500 MG tablet Take 500 mg by mouth daily.    Yes [provider]  baclofen (LIORESAL) 10 MG tablet Take 0.5 tablets (5 mg total) by mouth every 8 (eight) hours as needed for muscle spasms. Patient not taking: Reported on 01/15/2018  05/05/17   Ranelle Oyster, MD  collagenase (SANTYL) ointment Apply 1 application topically daily. To left heel Patient not taking: Reported on 01/15/2018 09/30/17   Ranelle Oyster, MD  gabapentin (NEURONTIN) 100 MG capsule Take 1 capsule (100 mg total) by mouth 3 (three) times daily. Hold for excessive sleepiness (sedation) Patient not taking: Reported on 01/15/2018 05/01/17   Erick Colace, MD  lidocaine (LIDODERM) 5 % Place 1 patch onto the skin every 12 (twelve) hours. Remove & Discard patch within 12 hours or as directed by MD Patient not taking: Reported on 01/15/2018 05/15/17 05/15/18  Shaune Pollack, MD  traMADol (ULTRAM) 50 MG tablet Take 1 tablet (50 mg total) by mouth 2 (two) times daily. Patient not taking: Reported on 01/15/2018 06/16/17   Lorenso Quarry, NP      PHYSICAL EXAMINATION:   VITAL SIGNS: Blood pressure (!) 169/81, pulse 92, temperature 98.1 F (36.7 C), temperature source Oral, resp. rate (!) 23, weight 84.8 kg (187 lb), SpO2 96 %.  GENERAL:  84 y.o.-year-old patient lying in the bed with, moderate distress, secondary to pain from shingles.  EYES: Pupils equal, round, reactive to light and accommodation. No scleral icterus.   HEENT: Head atraumatic, normocephalic. Oropharynx and nasopharynx clear.  NECK:  Supple, no jugular venous distention. No thyroid enlargement, no tenderness.  LUNGS: Reduced breath sounds bilaterally, no wheezing, rales,rhonchi or crepitation. No use of accessory muscles of respiration.  CARDIOVASCULAR: S1, S2 normal. No S3/S4.  ABDOMEN: Soft, nontender, nondistended. Bowel sounds present. No organomegaly or mass.  EXTREMITIES: No pedal edema, cyanosis, or clubbing.  NEUROLOGIC: No focal weakness PSYCHIATRIC: The patient is alert, but confused, lethargic. SKIN: Right flank area is noted with painful, erythematosus rash; several blisters are seen.  LABORATORY PANEL:   CBC Recent Labs  Lab 01/15/18 1700  WBC 8.1  HGB 11.9*  HCT 35.0  PLT  189  MCV 105.5*  MCH 35.8*  MCHC 33.9  RDW 14.2  LYMPHSABS 1.1  MONOABS 0.8  EOSABS 0.2  BASOSABS 0.1   ------------------------------------------------------------------------------------------------------------------  Chemistries  Recent Labs  Lab 01/15/18 1700  NA 137  K 4.5  CL 105  CO2 22  GLUCOSE 104*  BUN 32*  CREATININE 2.19*  CALCIUM 9.2  AST 62*  ALT 41  ALKPHOS 62  BILITOT 0.2*   ------------------------------------------------------------------------------------------------------------------ estimated creatinine clearance is 17.6 mL/min (A) (by C-G formula based on SCr of 2.19 mg/dL (H)). ------------------------------------------------------------------------------------------------------------------ No results for input(s): TSH, T4TOTAL, T3FREE, THYROIDAB in the last 72 hours.  Invalid input(s): FREET3   Coagulation profile No results for input(s): INR, PROTIME in the last 168 hours. ------------------------------------------------------------------------------------------------------------------- No results for input(s): DDIMER in the last 72 hours. -------------------------------------------------------------------------------------------------------------------  Cardiac Enzymes Recent Labs  Lab 01/15/18 1700 01/15/18 1931  TROPONINI 0.06* 0.13*   ------------------------------------------------------------------------------------------------------------------ Invalid input(s): POCBNP  ---------------------------------------------------------------------------------------------------------------  Urinalysis    Component Value Date/Time   COLORURINE YELLOW (A) 01/15/2018 1741   APPEARANCEUR CLEAR (A) 01/15/2018 1741   APPEARANCEUR Hazy 10/31/2013 1259   LABSPEC 1.014 01/15/2018 1741   LABSPEC 1.017 10/31/2013 1259   PHURINE 5.0 01/15/2018 1741   GLUCOSEU NEGATIVE 01/15/2018 1741   GLUCOSEU Negative 10/31/2013 1259   HGBUR NEGATIVE  01/15/2018 1741   BILIRUBINUR NEGATIVE 01/15/2018 1741   BILIRUBINUR Negative 10/31/2013 1259   KETONESUR NEGATIVE 01/15/2018 1741   PROTEINUR NEGATIVE 01/15/2018 1741   UROBILINOGEN 0.2 02/21/2010 1109   NITRITE NEGATIVE 01/15/2018 1741   LEUKOCYTESUR NEGATIVE 01/15/2018 1741   LEUKOCYTESUR 3+ 10/31/2013 1259     RADIOLOGY: Dg Chest Portable 1 View  Result Date: 01/15/2018 CLINICAL DATA:  84 y/o F; 84 y/o F; generalized weakness. Diagnosed with shingles 3-4 days ago. EXAM: PORTABLE CHEST 1 VIEW COMPARISON:  05/14/2017 chest radiograph FINDINGS: Stable cardiac silhouette given projection and technique. Aortic atherosclerosis with calcification. Clear lungs. No pleural effusion or pneumothorax. No acute osseous abnormality is evident. IMPRESSION: No active disease. Electronically Signed   By: Mitzi Hansen M.D.   On: 01/15/2018 17:36    EKG: Orders placed or performed during the hospital encounter of 05/13/17  . ED EKG  . ED EKG    IMPRESSION AND PLAN:  1.  Acute shingles episode.  Will use Valtrex 1000 mg p.o. twice daily and pain medication as needed.  We will keep patient in isolation.  Continue to monitor clinically closely. 2.  Acute gastroenteritis, could be viral in nature.  We will check stool studies to rule out bacterial infection.  Continue supportive measures. 3. NSTEMI.  Could be related to the stress from the acute illness.  Patient denies any chest pain.  We will continue to monitor patient on telemetry and follow the troponin level.  Cardiology is consulted for further evaluation and treatment. 4.  Acute encephalopathy, likely related to the acute viral syndrome.  We will start gentle IV hydration and continue to monitor clinically closely.  Avoid medications with sedative potential. 5.  CKD 4, stable.  Creatinine is at baseline.  Continue to monitor kidney function closely and avoid nephrotoxic medications. 6.  Hypertension, stable, continue home  medications.   All the records are reviewed and case discussed with ED provider. Management plans discussed with the patient, family and they are in agreement.  CODE STATUS: Code Status History    Date Active Date Inactive Code Status Order ID Comments User Context   05/13/2017 1702 05/15/2017 1901 DNR [id number]  Houston Siren, MD Inpatient   12/10/2015 0321 12/12/2015 1849 Full Code [id number]  Ihor Austin, MD Inpatient   11/21/2015 0536 11/21/2015 1731 Full Code [id number]  Arnaldo Natal, MD Inpatient   08/23/2015 0904 09/12/2015 1856 Partial Code [id number]  Jerene Pitch Inpatient   08/22/2015 1455 08/23/2015 [phone]  Partial Code [id number]  Jerene Pitch Inpatient   08/22/2015 1455 08/22/2015 1455 Full Code [id number]  Jerene Pitch Inpatient   08/09/2015 0865 08/22/2015 1455 Partial Code [id number]  [name], MD Inpatient   08/03/2015 1927 08/09/2015 0937 Full Code [id number]  Coletta Memos, MD Inpatient    Questions for Most Recent Historical Code Status (Order [id number])    Question Answer Comment   In the event of cardiac or respiratory ARREST Do not call a "code blue"    In the event of cardiac or respiratory ARREST Do not perform Intubation, CPR, defibrillation or ACLS    In the event of cardiac or respiratory ARREST Use medication by any route, position, wound care, and other measures to relive pain and suffering. May use oxygen, suction and manual treatment of airway obstruction as needed for comfort.        TOTAL TIME TAKING CARE OF THIS PATIENT: 45 minutes.    Cammy Copa M.D on 01/15/2018 at 9:55 PM  Between 7am to 6pm - Pager - [phone]  After 6pm go to [email or web address] - password [email or web address]  [place] [facility]  Office  [phone]  CC: Primary care physician; Kandyce Rud, MD

## 2018-01-15 NOTE — ED Notes (Signed)
This RN called to room due to pt "bleeding from somewhere" per caregiver. This RN into room where pt had pulled out her IV because "it hurt". Pt informed by this RN that she needs to have an IV and this RN would be back to place another one.

## 2018-01-15 NOTE — ED Provider Notes (Signed)
[facility] Emergency Department Provider Note ____________________________________________   First MD Initiated Contact with Patient 01/15/18 1631     (approximate)  I have reviewed the triage vital signs and the nursing notes.   HISTORY  Chief Complaint Weakness and Herpes Zoster    HPI Lisa [name] is a 84 y.o. female with PMH as noted below and status post diagnosis of shingles yesterday for which she is on valacyclovir who presents with generalized weakness over the course the day today, gradual onset, persistent, and associated with diarrhea earlier today.  Patient also reports nausea but no vomiting.  She denies fever, chest pain, or other acute symptoms.  Patient's caregivers state that she has been unable to walk today due to the weakness.   Past Medical History:  Diagnosis Date  . Allergy   . Allergy to shellfish    causes swelling  . Brain bleed (HCC)   . Chronic back pain   . Chronic systolic CHF (congestive heart failure) (HCC)   . Depression    unspecified  . Foot fracture    bilateral  . History of abnormal mammogram    Status post breast biopsy x 2, which were negative.   [name] History of chicken pox   . History of sinus tachycardia 11/1997   With dyspnea on exertion. Workup included a VQ scan which was normal, and ultrasound of her lower extremities, which was normal with no evidence of DVT.  [name] Hypertension   . Nocturnal leg cramps   . Osteoarthritis   . Osteoporosis   . Pelvis fracture (HCC)   . Peptic ulcer disease   . Renal disorder   . Spinal stenosis   . V-tach (HCC)   . Wrist fracture, right     Patient Active Problem List   Diagnosis Date Noted  . Chronic cough 07/06/2017  . Tremor, unspecified 06/23/2017  . Generalized weakness 06/06/2017  . Protein-calorie malnutrition (HCC) 05/25/2017  . Vitamin D deficiency 05/25/2017  . Urinary tract infection 05/13/2017  . Spondylosis without myelopathy or radiculopathy,  lumbar region 05/05/2017  . Right shoulder pain 09/23/2016  . New onset atrial fibrillation (HCC) 12/11/2015  . Altered mental state   . Congestive dilated cardiomyopathy (HCC)   . Dementia   . Headache 12/10/2015  . TIA (transient ischemic attack) 12/10/2015  . Influenza A 11/21/2015  . Ventricular tachycardia (HCC) 11/09/2015  . Constipation 09/12/2015  . Insomnia due to anxiety and fear 09/12/2015  . Depression, controlled 09/12/2015  . Bacterial UTI 08/27/2015  . DVT, lower extremity (HCC) 08/27/2015  . TBI (traumatic brain injury) (HCC) 08/22/2015  . ICH (intracerebral hemorrhage) (HCC)   . Abscess   . Hypomagnesemia   . SDH (subdural hematoma) (HCC)   . Boil of upper extremity   . Pneumonia due to Haemophilus influenzae (HCC)   . Chronic systolic CHF (congestive heart failure) (HCC)   . Acute on chronic renal failure (HCC)   . Hypokalemia   . Bright red blood per rectum   . Dysphagia   . Decubitus ulcer of left heel, stage 2 08/16/2015  . Acute respiratory failure with hypoxemia (HCC)   . Hypernatremia   . Subdural hematoma (HCC) 08/03/2015  . Lumbar radiculopathy 06/06/2015  . Spinal stenosis, lumbar region, with neurogenic claudication 04/02/2015  . Degenerative lumbar disc 02/15/2015  . Lumbar post-laminectomy syndrome 02/15/2015  . Facet syndrome, lumbar 02/15/2015  . Sacroiliac joint dysfunction 02/15/2015    Past Surgical History:  Procedure Laterality Date  .  BACK SURGERY N/A   . BREAST LUMPECTOMY N/A   . FRACTURE SURGERY  08/25/2013   ORIF left distal radius fracture.  . WISDOM TOOTH EXTRACTION      Prior to Admission medications   Medication Sig Start Date End Date Taking? Authorizing Provider  acetaminophen (TYLENOL) 325 MG tablet Take 650 mg by mouth every 6 (six) hours as needed.    [provider]  alum & mag hydroxide-simeth (MAALOX PLUS) 400-400-40 MG/5ML suspension Take 30 mLs by mouth every 4 (four) hours as needed for indigestion.     [provider]  Amino Acids-Protein Hydrolys (FEEDING SUPPLEMENT, PRO-STAT SUGAR FREE 64,) LIQD Take 30 mLs by mouth 2 (two) times daily between meals.    [provider]  amiodarone (PACERONE) 200 MG tablet TAKE 1/2 TABLET BY MOUTH DAILY 01/06/18   Iran Ouch, MD  apixaban (ELIQUIS) 2.5 MG TABS tablet Take 2.5 mg by mouth 2 (two) times daily.    [provider]  baclofen (LIORESAL) 10 MG tablet Take 0.5 tablets (5 mg total) by mouth every 8 (eight) hours as needed for muscle spasms. 05/05/17   Ranelle Oyster, MD  capsaicin (ZOSTRIX) 0.025 % cream Massage cream onto the posterior neck and shoulders topically 3 times daily for muscle cramps    [provider]  cephALEXin (KEFLEX) 250 MG capsule  10/19/17   [provider]  Cholecalciferol 2000 units CAPS Take 2 capsules by mouth daily. 4000 units = 2 caps    [provider]  citalopram (CELEXA) 20 MG tablet Take 20 mg by mouth daily.  02/11/16   [provider]  collagenase (SANTYL) ointment Apply 1 application topically daily. To left heel 09/30/17   Ranelle Oyster, MD  Cyanocobalamin (B-12) 1000 MCG TABS Take 1 tablet by mouth daily.     [provider]  ELIQUIS 2.5 MG TABS tablet TAKE ONE TABLET TWICE DAILY 10/05/17   Iran Ouch, MD  ferrous sulfate 325 (65 FE) MG EC tablet Take 325 mg by mouth 2 (two) times daily.    [provider]  furosemide (LASIX) 20 MG tablet Take 20 mg by mouth daily.    [provider]  gabapentin (NEURONTIN) 100 MG capsule Take 1 capsule (100 mg total) by mouth 3 (three) times daily. Hold for excessive sleepiness (sedation) 05/01/17   Kirsteins, Victorino Sparrow, MD  Infant Care Products [facility]) CREA Apply liberal amount topically to area of skin irritation as needed. Ok to leave at bedside.    [provider]  ipratropium-albuterol (DUONEB) 0.5-2.5 (3) MG/3ML SOLN Take 3 mLs by nebulization 2 (two) times daily.      [provider]  ipratropium-albuterol (DUONEB) 0.5-2.5 (3) MG/3ML SOLN Take 3 mLs by nebulization every 6 (six) hours as needed.    [provider]  irbesartan (AVAPRO) 150 MG tablet Take 150 mg by mouth daily.     [provider]  levothyroxine (SYNTHROID, LEVOTHROID) 25 MCG tablet Take 25 mcg by mouth daily before breakfast.    [provider]  lidocaine (LIDODERM) 5 % Place 1 patch onto the skin every 12 (twelve) hours. Remove & Discard patch within 12 hours or as directed by MD 05/15/17 05/15/18  Shaune Pollack, MD  loratadine (CLARITIN) 10 MG tablet Take 1 tablet (10 mg total) by mouth daily. 07/06/17   Leslye Peer, MD  metoprolol tartrate (LOPRESSOR) 25 MG tablet Take 12.5 mg by mouth 2 (two) times daily.  02/11/16  [provider]  mirtazapine (REMERON) 15 MG tablet Take 15 mg by mouth at bedtime.  02/11/16   [provider]  Nutritional Supplements (FEEDING SUPPLEMENT, NEPRO CARB STEADY,) LIQD Take 1,000 mLs by mouth 2 (two) times daily between meals.    [provider]  omeprazole (PRILOSEC) 20 MG capsule Take 1 capsule by mouth daily.  10/05/15   [provider]  Polyethyl Glycol-Propyl Glycol (SYSTANE) 0.4-0.3 % SOLN Place 1 drop into both eyes as needed.     [provider]  potassium chloride (MICRO-K) 10 MEQ CR capsule Take 10 mEq by mouth daily.    [provider]  raloxifene (EVISTA) 60 MG tablet Take 1 tablet by mouth daily.  10/05/15   [provider]  ranitidine (ZANTAC) 75 MG tablet Take 75 mg by mouth 2 (two) times daily.    [provider]  risperiDONE (RISPERDAL) 0.25 MG tablet TAKE 1 TABLET BY MOUTH AT BEDTIME 11/20/17   Ranelle Oyster, MD  senna-docusate (SENNA-PLUS) 8.6-50 MG tablet Take 1 tablet by mouth 2 (two) times daily.     [provider]  traMADol (ULTRAM) 50 MG tablet Take 1 tablet (50 mg total) by mouth 2 (two) times daily. 06/16/17   Lorenso Quarry, NP   vitamin C (ASCORBIC ACID) 500 MG tablet Take 500 mg by mouth 2 (two) times daily.     [provider]    Allergies Ambien [zolpidem tartrate]; Daypro [oxaprozin]; Oxycodone; Shellfish allergy; Ace inhibitors; and Norvasc [amlodipine besylate]  Family History  Problem Relation Age of Onset  . COPD Mother   . High blood pressure Mother   . Lung disease Mother   . COPD Father   . Heart disease Father   . High blood pressure Father   . Heart attack Father   . Skin cancer Brother     Social History Social History   Tobacco Use  . Smoking status: Never Smoker  . Smokeless tobacco: Never Used  Substance Use Topics  . Alcohol use: Yes    Alcohol/week: 0.0 oz    Comment: occasional wine  . Drug use: No    Review of Systems  Constitutional: No fever.  Positive for weakness. Eyes: No redness. ENT: No sore throat. Cardiovascular: Denies chest pain. Respiratory: Denies shortness of breath. Gastrointestinal: Positive for nausea and resolved diarrhea.  Genitourinary: Negative for dysuria.  Musculoskeletal: Negative for back pain. Skin: Positive for shingles rash. Neurological: Negative for headache.   ____________________________________________   PHYSICAL EXAM:  VITAL SIGNS: ED Triage Vitals [01/15/18 1616]  Enc Vitals Group     BP (!) 186/94     Pulse Rate 98     Resp 18     Temp 98.1 F (36.7 C)     Temp Source Oral     SpO2 99 %     Weight 187 lb (84.8 kg)     Height      Head Circumference      Peak Flow      Pain Score 0     Pain Loc      Pain Edu?      Excl. in GC?     Constitutional: Alert and oriented.  Relatively comfortable appearing and in no acute distress. Eyes: Conjunctivae are normal.  Head: Atraumatic. Nose: No congestion/rhinnorhea. Mouth/Throat: Mucous membranes are slightly dry.   Neck: Normal range of motion.  Cardiovascular: Normal rate, regular rhythm. Grossly normal heart sounds.  Good peripheral  circulation. Respiratory: Normal respiratory  effort.  No retractions. Lungs CTAB. Gastrointestinal: Soft and nontender. No distention.  Genitourinary: No flank tenderness. Musculoskeletal: No lower extremity edema.  Extremities warm and well perfused.  Neurologic:  Normal speech and language.  Motor intact in all extremities.  No gross focal neurologic deficits are appreciated.  Skin:  Skin is warm and dry. No rash noted. Psychiatric: Mood and affect are normal. Speech and behavior are normal.  ____________________________________________   LABS (all labs ordered are listed, but only abnormal results are displayed)  Labs Reviewed  COMPREHENSIVE METABOLIC PANEL - Abnormal; Notable for the following components:      Result Value   Glucose, Bld 104 (*)    BUN 32 (*)    Creatinine, Ser 2.19 (*)    AST 62 (*)    Total Bilirubin 0.2 (*)    GFR calc non Af Amer 19 (*)    GFR calc Af Amer 22 (*)    All other components within normal limits  LIPASE, BLOOD - Abnormal; Notable for the following components:   Lipase 83 (*)    All other components within normal limits  CBC WITH DIFFERENTIAL/PLATELET - Abnormal; Notable for the following components:   RBC 3.32 (*)    Hemoglobin 11.9 (*)    MCV 105.5 (*)    MCH 35.8 (*)    All other components within normal limits  TROPONIN I - Abnormal; Notable for the following components:   Troponin I 0.06 (*)    All other components within normal limits  URINALYSIS, COMPLETE (UACMP) WITH MICROSCOPIC - Abnormal; Notable for the following components:   Color, Urine YELLOW (*)    APPearance CLEAR (*)    All other components within normal limits  TROPONIN I - Abnormal; Notable for the following components:   Troponin I 0.13 (*)    All other components within normal limits   ____________________________________________  EKG  ED ECG REPORT I, Dionne Bucy, the attending physician, personally viewed and interpreted this ECG.  Date:  01/15/2018 EKG Time: 1621 Rate: 97 Rhythm: normal sinus rhythm QRS Axis: normal Intervals: LBBB ST/T Wave abnormalities: normal Narrative Interpretation: no evidence of acute ischemia; no significant change when compared to EKG of 03/05/2017  ____________________________________________  RADIOLOGY  CXR: No focal infiltrate or other acute findings  ____________________________________________   PROCEDURES  Procedure(s) performed: No  Procedures  Critical Care performed: No ____________________________________________   INITIAL IMPRESSION / ASSESSMENT AND PLAN / ED COURSE  Pertinent labs & imaging results that were available during my care of the patient were reviewed by me and considered in my medical decision making (see chart for details).  84 year old female with PMH as noted above and status post recent diagnosis of shingles presents with generalized weakness, diarrhea earlier today which has resolved, and nausea but no vomiting.  Past medical records reviewed in Epic; I confirm that the patient was seen by her PMD yesterday and prescribed valacyclovir for shingles.  On exam, the patient is relatively comfortable appearing, she is hypertensive but other vital signs are normal, and the remainder of the exam is as described above.  Mucous membranes are slightly dry.  The abdomen is soft and nontender.  Differential includes systemic symptoms related to the zoster infection, other viral syndrome, gastroenteritis, dehydration or other metabolic disturbance, or other source of infection.  Plan for basic labs, chest x-ray, UA, fluids, and reassess.    ----------------------------------------- 8:36 PM on 01/15/2018 -----------------------------------------  Initial lab work-up was relatively unremarkable and consistent with patient's baseline, except  for minimally elevated troponin.  The patient was unable to ambulate even with assistance due to her generalized weakness.   Repeat troponin has increased.  Patient may be having some demand ischemia related to acute viral infection or dehydration.  We will admit for further monitoring.  There is no indication for anticoagulation at this time.  I signed the patient out to the hospitalist for admission.  ____________________________________________   FINAL CLINICAL IMPRESSION(S) / ED DIAGNOSES  Final diagnoses:  Elevated troponin  Generalized weakness      NEW MEDICATIONS STARTED DURING THIS VISIT:  New Prescriptions   No medications on file     Note:  This document was prepared using Dragon voice recognition software and may include unintentional dictation errors.    Dionne BucySiadecki, Johanna Matto, MD 01/15/18 2037

## 2018-01-15 NOTE — ED Notes (Signed)
ED Provider at bedside. 

## 2018-01-15 NOTE — ED Notes (Signed)
This RN attempted to get pt up and ambulating. Pt very shaky upon standing and unable to take any steps at this time. Pt does use walker at home. MD Siadecki informed.

## 2018-01-15 NOTE — ED Notes (Signed)
Spoke with 1C about pt placement due to pt's need for isolation due to shingles[facility] will need a negative pressure room.

## 2018-01-16 DIAGNOSIS — R748 Abnormal levels of other serum enzymes: Secondary | ICD-10-CM

## 2018-01-16 LAB — CBC
HCT: 32.5 % — ABNORMAL LOW (ref 35.0–47.0)
Hemoglobin: 11.2 g/dL — ABNORMAL LOW (ref 12.0–16.0)
MCH: 36.7 pg — ABNORMAL HIGH (ref 26.0–34.0)
MCHC: 34.6 g/dL (ref 32.0–36.0)
MCV: 106.1 fL — ABNORMAL HIGH (ref 80.0–100.0)
PLATELETS: 183 10*3/uL (ref 150–440)
RBC: 3.06 MIL/uL — ABNORMAL LOW (ref 3.80–5.20)
RDW: 14.2 % (ref 11.5–14.5)
WBC: 7.6 10*3/uL (ref 3.6–11.0)

## 2018-01-16 LAB — GLUCOSE, CAPILLARY: GLUCOSE-CAPILLARY: 112 mg/dL — AB (ref 65–99)

## 2018-01-16 LAB — BASIC METABOLIC PANEL
Anion gap: 7 (ref 5–15)
BUN: 29 mg/dL — AB (ref 6–20)
CALCIUM: 8.5 mg/dL — AB (ref 8.9–10.3)
CO2: 22 mmol/L (ref 22–32)
CREATININE: 2.07 mg/dL — AB (ref 0.44–1.00)
Chloride: 108 mmol/L (ref 101–111)
GFR calc Af Amer: 23 mL/min — ABNORMAL LOW (ref >60)
GFR calc non Af Amer: 20 mL/min — ABNORMAL LOW (ref >60)
Glucose, Bld: 120 mg/dL — ABNORMAL HIGH (ref 65–99)
POTASSIUM: 4.5 mmol/L (ref 3.5–5.1)
SODIUM: 137 mmol/L (ref 135–145)

## 2018-01-16 LAB — AMMONIA: Ammonia: 25 umol/L (ref 9–35)

## 2018-01-16 LAB — PREALBUMIN: Prealbumin: 24.6 mg/dL (ref 18–38)

## 2018-01-16 MED ORDER — FLORANEX PO PACK
1.0000 g | PACK | Freq: Three times a day (TID) | ORAL | Status: DC
  Administered 2018-01-16 (×2): 1 g via ORAL
  Filled 2018-01-16 (×4): qty 1

## 2018-01-16 MED ORDER — POLYVINYL ALCOHOL 1.4 % OP SOLN
1.0000 [drp] | OPHTHALMIC | Status: DC | PRN
Start: 2018-01-16 — End: 2018-01-19
  Administered 2018-01-18: 1 [drp] via OPHTHALMIC
  Filled 2018-01-16: qty 15

## 2018-01-16 NOTE — Progress Notes (Signed)
[employer] [facility] [place] [facility]   PATIENT NAME: Lisa [name]    MR#:  [id number]  DATE OF BIRTH:  June 22, 1927  SUBJECTIVE:  CHIEF COMPLAINT:   Chief Complaint  Patient presents with  . Weakness  . Herpes Zoster  no problems overnight per staff, pt w/o complaint  REVIEW OF SYSTEMS:  CONSTITUTIONAL: No fever, fatigue or weakness.  EYES: No blurred or double vision.  EARS, NOSE, AND THROAT: No tinnitus or ear pain.  RESPIRATORY: No cough, shortness of breath, wheezing or hemoptysis.  CARDIOVASCULAR: No chest pain, orthopnea, edema.  GASTROINTESTINAL: No nausea, vomiting, diarrhea or abdominal pain.  GENITOURINARY: No dysuria, hematuria.  ENDOCRINE: No polyuria, nocturia,  HEMATOLOGY: No anemia, easy bruising or bleeding SKIN: No rash or lesion. MUSCULOSKELETAL: No joint pain or arthritis.   NEUROLOGIC: No tingling, numbness, weakness.  PSYCHIATRY: No anxiety or depression.   ROS  DRUG ALLERGIES:   Allergies  Allergen Reactions  . Ambien [Zolpidem Tartrate] Other (See Comments)    Reaction:  Hallucinations   . Daypro [Oxaprozin] Other (See Comments)    GI upset  . Oxycodone Nausea And Vomiting  . Shellfish Allergy Swelling and Other (See Comments)    Pts mouth and face swells.   . Ace Inhibitors Cough  . Norvasc [Amlodipine Besylate] Palpitations    VITALS:  Blood pressure (!) 158/77, pulse 79, temperature 98.6 F (37 C), temperature source Oral, resp. rate 20, height  (1.575 m), weight 86 kg (189 lb 9.5 oz), SpO2 95 %.  PHYSICAL EXAMINATION:  GENERAL:  84 y.o.-year-old patient lying in the bed with no acute distress.  EYES: Pupils equal, round, reactive to light and accommodation. No scleral icterus. Extraocular muscles intact.  HEENT: Head atraumatic, normocephalic. Oropharynx and nasopharynx clear.  NECK:  Supple, no jugular venous distention. No thyroid enlargement, no tenderness.  LUNGS: Normal breath sounds bilaterally, no wheezing,  rales,rhonchi or crepitation. No use of accessory muscles of respiration.  CARDIOVASCULAR: S1, S2 normal. No murmurs, rubs, or gallops.  ABDOMEN: Soft, nontender, nondistended. Bowel sounds present. No organomegaly or mass.  EXTREMITIES: No pedal edema, cyanosis, or clubbing.  NEUROLOGIC: Cranial nerves II through XII are intact. Muscle strength 5/5 in all extremities. Sensation intact. Gait not checked.  PSYCHIATRIC: The patient is alert and oriented x 3.  SKIN: No obvious rash, lesion, or ulcer.   Physical Exam LABORATORY PANEL:   CBC Recent Labs  Lab 01/16/18 0509  WBC 7.6  HGB 11.2*  HCT 32.5*  PLT 183   ------------------------------------------------------------------------------------------------------------------  Chemistries  Recent Labs  Lab 01/15/18 1700 01/16/18 0509  NA 137 137  K 4.5 4.5  CL 105 108  CO2 22 22  GLUCOSE 104* 120*  BUN 32* 29*  CREATININE 2.19* 2.07*  CALCIUM 9.2 8.5*  AST 62*  --   ALT 41  --   ALKPHOS 62  --   BILITOT 0.2*  --    ------------------------------------------------------------------------------------------------------------------  Cardiac Enzymes Recent Labs  Lab 01/15/18 1700 01/15/18 1931  TROPONINI 0.06* 0.13*   ------------------------------------------------------------------------------------------------------------------  RADIOLOGY:  Dg Chest Portable 1 View  Result Date: 01/15/2018 CLINICAL DATA:  84 y/o F; 85 y/o F; generalized weakness. Diagnosed with shingles 3-4 days ago. EXAM: PORTABLE CHEST 1 VIEW COMPARISON:  05/14/2017 chest radiograph FINDINGS: Stable cardiac silhouette given projection and technique. Aortic atherosclerosis with calcification. Clear lungs. No pleural effusion or pneumothorax. No acute osseous abnormality is evident. IMPRESSION: No active disease. Electronically Signed   By: Buzzy Han.D.  On: 01/15/2018 17:36    ASSESSMENT AND PLAN:  1.  Acute shingles  episode Stable Continue Valtrex 1000 mg p.o. twice daily and adult pain protocol  2.  Acute gastroenteritis Resolved No nausea/vomiting/diarrhea per nursing staff overnight or today Check GI panel if loose stool/diarrhea returns  3.  Acute elevated troponins  Most likely secondary to demand ischemia  Cardiology did see patient while in house-patient with known left ventricular dysfunction/prior stress-induced cardiomyopathy-no additional cardiac work-up was recommended  Continue  ARB and BB  4.  Acute encephalopathy Resolved  5.  History of traumatic brain injury Stable Continue home psychotropic regiment  6. CKD 4 Stable Avoid nephrotoxic medications  6.  Hypertension, chronic Stable continue home medications  7.  Acute immobility syndrome/deconditioning Physical therapy following-recommended skilled nursing facility placement, tentatively planned for on Monday  All the records are reviewed and case discussed with Care Management/Social Workerr. Management plans discussed with the patient, family and they are in agreement.  CODE STATUS: DNR  TOTAL TIME TAKING CARE OF THIS PATIENT: 35 minutes.     POSSIBLE D/C IN 1-3 DAYS, DEPENDING ON CLINICAL CONDITION.   Evelena Asa Kyrstan Gotwalt M.D on 01/16/2018   Between 7am to 6pm - Pager - [phone]  After 6pm go to [email or web address] - password [email or web address]  [facility]  Office  [phone]  CC: Primary care physician; Kandyce Rud, MD  Note: This dictation was prepared with Dragon dictation along with smaller phrase technology. Any transcriptional errors that result from this process are unintentional.

## 2018-01-16 NOTE — Evaluation (Signed)
Physical Therapy Evaluation Patient Details Name: Lisa [name] MRN: [id number] DOB: 11/07/1926 Today's Date: 01/16/2018   History of Present Illness  pt is a 84 y.o F with admitted on 01/15/2018 with dx of shingles. chronic back pain, chronic systolic CHF, hypertension, osteoporosis and CKD4. She was brought to the ED acute onset severe fatigue and generalized weakness noticed since yesterday, gradually getting worse. she had recently started medication for treating. pt continues to demonstrate difficulty recalling prevous functional status and previous living situation.   Clinical Impression  Pt presently laying in bed with a sitter present in the room. She is A&O only to person and place and reports 3/10 pain in the R flank region. She is a poor historian and had difficulty recalling prior level of function and living situation. She demonstrates limited LE strength in bil LE. She required mod assist with frequent verbal cues for LE use and UE hand placment with suping<>sit, and MOD assist+1 with sit <> stand. Practiced standing 3 x holding 2 min which she fatigued quickly with. She was returned back to bed end of session. She would benefit from continued physical therapy to promote bed mobility/ transfers, AMB with RW and maximize her function by addressing the deficits listed.  Based on limited function upon evaluation and pt's difficulty recalling prior level of function/living situation she would benefit from continued PT via SNF upon discharge.     Follow Up Recommendations SNF    Equipment Recommendations  Rolling walker with 5" wheels    Recommendations for Other Services OT consult     Precautions / Restrictions Precautions Precautions: Fall Restrictions Weight Bearing Restrictions: No      Mobility  Bed Mobility Overal bed mobility: Needs Assistance Bed Mobility: Supine to Sit     Supine to sit: Mod assist(+1)     General bed mobility comments: pt required verbal cues for  assisting with moving the legs, and reaching/ use of UE's for sitting on EOB and returning to laying down  Transfers Overall transfer level: Needs assistance   Transfers: Sit to/from Stand Sit to Stand: Mod assist(+1 )         General transfer comment: cues for rocking forward and pushing with hands from the bed.   Ambulation/Gait                Stairs            Wheelchair Mobility    Modified Rankin (Stroke Patients Only)       Balance                                             Pertinent Vitals/Pain Pain Assessment: 0-10 Pain Score: 3  Pain Location: R flank Pain Descriptors / Indicators: Aching Pain Intervention(s): Limited activity within patient's tolerance;Monitored during session    Home Living Family/patient expects to be discharged to:: Unsure                 Additional Comments: pt reports assisted living but is unable state where she lives    Prior Function Level of Independence: Needs assistance   Gait / Transfers Assistance Needed: pt reports she usually uses a RW for general ambulation     Comments: pt is poor historian     Hand Dominance   Dominant Hand: Left    Extremity/Trunk Assessment   Upper Extremity Assessment  Upper Extremity Assessment: Defer to OT evaluation    Lower Extremity Assessment Lower Extremity Assessment: RLE deficits/detail;LLE deficits/detail RLE Deficits / Details: general 4-/5  LLE Deficits / Details: general 4-/5       Communication   Communication: No difficulties  Cognition Arousal/Alertness: Awake/alert;Lethargic(to person and place only)   Overall Cognitive Status: Difficult to assess                                 General Comments: pt demosntrates signifciant difficulty recalling prior level of function or if she wanted to stay in the bed or get in the recliner.      General Comments      Exercises Other Exercises Other Exercises: 3 x  standing for 2 min ea. with +1 mod assist for maintaining balance    Assessment/Plan    PT Assessment Patient needs continued PT services  PT Problem List Decreased strength;Decreased balance;Decreased mobility;Decreased cognition;Pain;Obesity;Decreased safety awareness;Decreased range of motion;Decreased activity tolerance       PT Treatment Interventions DME instruction;Therapeutic activities;Therapeutic exercise;Patient/family education;Balance training;Stair training;Gait training    PT Goals (Current goals can be found in the Care Plan section)  Acute Rehab PT Goals Patient Stated Goal: To feel better PT Goal Formulation: With patient Time For Goal Achievement: 01/30/18 Potential to Achieve Goals: Good    Frequency Min 2X/week   Barriers to discharge Other (comment)(unsure of home situation)      Co-evaluation               AM-PAC PT "6 Clicks" Daily Activity  Outcome Measure Difficulty turning over in bed (including adjusting bedclothes, sheets and blankets)?: A Lot Difficulty moving from lying on back to sitting on the side of the bed? : A Lot Difficulty sitting down on and standing up from a chair with arms (e.g., wheelchair, bedside commode, etc,.)?: A Lot Help needed moving to and from a bed to chair (including a wheelchair)?: A Lot Help needed walking in hospital room?: Total Help needed climbing 3-5 steps with a railing? : Total 6 Click Score: 10    End of Session Equipment Utilized During Treatment: Gait belt   Patient left: in bed;with call bell/phone within reach;with bed alarm set Nurse Communication: Mobility status PT Visit Diagnosis: Pain;Unsteadiness on feet (R26.81);Muscle weakness (generalized) (M62.81);Other abnormalities of gait and mobility (R26.89) Pain - Right/Left: Right Pain - part of body: (Flank pain)    Time: 1610-9604 PT Time Calculation (min) (ACUTE ONLY): 26 min   Charges:   PT Evaluation $PT Eval Moderate Complexity: 1  Mod PT Treatments $Therapeutic Activity: 8-22 mins   PT G Codes:        Lisa [name] PT, DPT, LAT, ATC  01/16/18  10:13 AM       Lisa [name], Avelino Leeds 01/16/2018, 10:08 AM

## 2018-01-16 NOTE — Consult Note (Addendum)
Cardiology Consultation:   Patient ID: Lisa [name]; [id number]; 08-15-1927   Admit date: 01/15/2018 Date of Consult: 01/16/2018  Primary Care Provider: Kandyce Rud, MD Primary Cardiologist:New   Patient Profile:   Lisa [name] is a 84 y.o. female with a hx of who is being seen today for the evaluation of fatigue and elevated troponin in the setting of shingles at the request of Dr Caryn Bee  History of Present Illness:   Lisa [name]  Is an 84 yo with hstory of chronic systoiic CHF, HTN, CKD who presents for fatigue and weakness  Dx with shingles yesterday.  Pt weak for 24 hours, diarrhea  Trop 0.06 and 0.13.  Cr 2.19   Pt denies CP but feels poorly. She has been treated with anti-biotics. She has also had some GI distress. No anginal symptoms.  Past Medical History:  Diagnosis Date  . Allergy   . Allergy to shellfish    causes swelling  . Brain bleed (HCC)   . Chronic back pain   . Chronic systolic CHF (congestive heart failure) (HCC)   . Depression    unspecified  . Foot fracture    bilateral  . History of abnormal mammogram    Status post breast biopsy x 2, which were negative.   [name] History of chicken pox   . History of sinus tachycardia 11/1997   With dyspnea on exertion. Workup included a VQ scan which was normal, and ultrasound of her lower extremities, which was normal with no evidence of DVT.  [name] Hypertension   . Nocturnal leg cramps   . Osteoarthritis   . Osteoporosis   . Pelvis fracture (HCC)   . Peptic ulcer disease   . Renal disorder   . Spinal stenosis   . V-tach (HCC)   . Wrist fracture, right     Past Surgical History:  Procedure Laterality Date  . BACK SURGERY N/A   . BREAST LUMPECTOMY N/A   . FRACTURE SURGERY  08/25/2013   ORIF left distal radius fracture.  . WISDOM TOOTH EXTRACTION       Home Medications:  Prior to Admission medications   Medication Sig Start Date End Date Taking? Authorizing Provider  acetaminophen (TYLENOL) 325 MG tablet Take  650 mg by mouth every 6 (six) hours as needed.   Yes [provider]  albuterol (PROVENTIL HFA;VENTOLIN HFA) 108 (90 Base) MCG/ACT inhaler Inhale 2 puffs into the lungs every 6 (six) hours as needed for wheezing or shortness of breath.   Yes [provider]  alum & mag hydroxide-simeth (MAALOX PLUS) 400-400-40 MG/5ML suspension Take 30 mLs by mouth every 4 (four) hours as needed for indigestion.   Yes [provider]  Amino Acids-Protein Hydrolys (FEEDING SUPPLEMENT, PRO-STAT SUGAR FREE 64,) LIQD Take 30 mLs by mouth 2 (two) times daily between meals.   Yes [provider]  amiodarone (PACERONE) 200 MG tablet TAKE 1/2 TABLET BY MOUTH DAILY Patient taking differently: Take  tablet ( ) by mouth every day 01/06/18  Yes Iran Ouch, MD  Cholecalciferol 1000 units tablet Take 1 capsule by mouth daily.    Yes [provider]  citalopram (CELEXA) 20 MG tablet Take 20 mg by mouth daily.    Yes [provider]  Cyanocobalamin (B-12) 1000 MCG TABS Take 1 tablet by mouth daily.    Yes [provider]  ELIQUIS 2.5 MG TABS tablet TAKE ONE TABLET TWICE DAILY 10/05/17  Yes Iran Ouch, MD  ferrous sulfate 325 (  65 FE) MG EC tablet Take 325 mg by mouth 2 (two) times daily.   Yes [provider]  furosemide (LASIX) 20 MG tablet Take 20 mg by mouth daily.    Yes [provider]  irbesartan (AVAPRO) 150 MG tablet Take 150 mg by mouth daily.    Yes [provider]  levothyroxine (SYNTHROID, LEVOTHROID) 50 MCG tablet Take 50 mcg by mouth daily before breakfast.    Yes [provider]  loratadine (CLARITIN) 10 MG tablet Take 1 tablet (10 mg total) by mouth daily. 07/06/17  Yes Leslye Peer, MD  metoprolol tartrate (LOPRESSOR) 25 MG tablet Take 12.5 mg by mouth 2 (two) times daily.  02/11/16  Yes [provider]  mirtazapine (REMERON) 15 MG tablet Take 15 mg by mouth at bedtime.  02/11/16  Yes  [provider]  montelukast (SINGULAIR) 10 MG tablet Take 10 mg by mouth every evening.   Yes [provider]  Multiple Vitamins-Minerals (ICAPS AREDS 2) CAPS Take 1 capsule by mouth 2 (two) times daily.   Yes [provider]  omeprazole (PRILOSEC) 20 MG capsule Take 1 capsule by mouth daily.  10/05/15  Yes [provider]  Polyethyl Glycol-Propyl Glycol (SYSTANE) 0.4-0.3 % SOLN Place 1 drop into both eyes as needed.    Yes [provider]  primidone (MYSOLINE) 50 MG tablet Take 50 mg by mouth 2 (two) times daily.   Yes [provider]  raloxifene (EVISTA) 60 MG tablet Take 1 tablet by mouth daily.  10/05/15  Yes [provider]  risperiDONE (RISPERDAL) 0.25 MG tablet TAKE 1 TABLET BY MOUTH AT BEDTIME 11/20/17  Yes Ranelle Oyster, MD  triamcinolone cream (KENALOG) 0.1 % Apply 1 application topically 2 (two) times daily. 01/14/18 01/14/19 Yes [provider]  valACYclovir (VALTREX) 1000 MG tablet Take 1 tablet by mouth 3 (three) times daily. 01/14/18 01/21/18 Yes [provider]  vitamin C (ASCORBIC ACID) 500 MG tablet Take 500 mg by mouth daily.    Yes [provider]  baclofen (LIORESAL) 10 MG tablet Take 0.5 tablets (5 mg total) by mouth every 8 (eight) hours as needed for muscle spasms. Patient not taking: Reported on 01/15/2018 05/05/17   Ranelle Oyster, MD  collagenase (SANTYL) ointment Apply 1 application topically daily. To left heel Patient not taking: Reported on 01/15/2018 09/30/17   Ranelle Oyster, MD  gabapentin (NEURONTIN) 100 MG capsule Take 1 capsule (100 mg total) by mouth 3 (three) times daily. Hold for excessive sleepiness (sedation) Patient not taking: Reported on 01/15/2018 05/01/17   Erick Colace, MD  lidocaine (LIDODERM) 5 % Place 1 patch onto the skin every 12 (twelve) hours. Remove & Discard patch within 12 hours or as directed by MD Patient not taking: Reported on 01/15/2018 05/15/17  05/15/18  Shaune Pollack, MD  traMADol (ULTRAM) 50 MG tablet Take 1 tablet (50 mg total) by mouth 2 (two) times daily. Patient not taking: Reported on 01/15/2018 06/16/17   Lorenso Quarry, NP    Inpatient Medications: Scheduled Meds: . amiodarone  100 mg Oral Daily  . apixaban  2.5 mg Oral BID  . cholecalciferol  1,000 Units Oral Daily  . citalopram  20 mg Oral Daily  . docusate sodium  100 mg Oral BID  . feeding supplement (PRO-STAT SUGAR FREE 64)  30 mL Oral BID BM  . ferrous sulfate  325 mg Oral BID  . irbesartan  150 mg Oral Daily  . lactobacillus  1 g Oral TID WC  . levothyroxine  50 mcg Oral QAC breakfast  . loratadine  10 mg Oral Daily  . metoprolol tartrate  12.5 mg Oral BID  . mirtazapine  15 mg Oral QHS  . montelukast  10 mg Oral QPM  . multivitamin-lutein  1 capsule Oral BID  . pantoprazole  40 mg Oral Daily  . primidone  50 mg Oral BID  . raloxifene  60 mg Oral Daily  . risperiDONE  0.25 mg Oral QHS  . valACYclovir  1,000 mg Oral Q12H  . vitamin B-12  100 mcg Oral Daily  . vitamin C  500 mg Oral Daily   Continuous Infusions: . sodium chloride 75 mL/hr at 01/15/18 2347   PRN Meds: acetaminophen **OR** acetaminophen, albuterol, alum & mag hydroxide-simeth, bisacodyl, HYDROcodone-acetaminophen, ondansetron **OR** ondansetron (ZOFRAN) IV, polyvinyl alcohol, traZODone  Allergies:    Allergies  Allergen Reactions  . Ambien [Zolpidem Tartrate] Other (See Comments)    Reaction:  Hallucinations   . Daypro [Oxaprozin] Other (See Comments)    GI upset  . Oxycodone Nausea And Vomiting  . Shellfish Allergy Swelling and Other (See Comments)    Pts mouth and face swells.   . Ace Inhibitors Cough  . Norvasc [Amlodipine Besylate] Palpitations    Social History:   Social History   Socioeconomic History  . Marital status: Widowed    Spouse name: Not on file  . Number of children: 1  . Years of education: 37  . Highest education level: Not on file  Occupational History  .  Occupation: retired  Engineer, production  . Financial resource strain: Not on file  . Food insecurity:    Worry: Not on file    Inability: Not on file  . Transportation needs:    Medical: Not on file    Non-medical: Not on file  Tobacco Use  . Smoking status: Never Smoker  . Smokeless tobacco: Never Used  Substance and Sexual Activity  . Alcohol use: Yes    Alcohol/week: 0.0 oz    Comment: occasional wine  . Drug use: No  . Sexual activity: Not on file  Lifestyle  . Physical activity:    Days per week: Not on file    Minutes per session: Not on file  . Stress: Not on file  Relationships  . Social connections:    Talks on phone: Not on file    Gets together: Not on file    Attends religious service: Not on file    Active member of club or organization: Not on file    Attends meetings of clubs or organizations: Not on file    Relationship status: Not on file  . Intimate partner violence:    Fear of current or ex partner: Not on file    Emotionally abused: Not on file    Physically abused: Not on file    Forced sexual activity: Not on file  Other Topics Concern  . Not on file  Social History Narrative   Admitted to [facility] 05/15/17 - 06/19/17   Widowed   Never smoker   Occasionally drinks wine   DNR    Family History:    Family History  Problem Relation Age of Onset  . COPD Mother   . High blood pressure Mother   . Lung disease Mother   . COPD Father   . Heart disease Father   . High blood pressure Father   . Heart attack Father   .  Skin cancer Brother      ROS:  Please see the history of present illness All other ROS reviewed and negative.     Physical Exam/Data:   Vitals:   01/15/18 2343 01/15/18 2348 01/16/18 0415 01/16/18 0558  BP:  (!) 187/93  (!) 175/71  Pulse:  90  77  Resp:  20  20  Temp:  98.8 F (37.1 C)  98.9 F (37.2 C)  TempSrc:  Oral  Oral  SpO2:  94%  95%  Weight: 181 lb 10.5 oz (82.4 kg)  189 lb 9.5 oz (86 kg)   Height: 5'  2" (1.575 m)       Intake/Output Summary (Last 24 hours) at 01/16/2018 0858 Last data filed at 01/16/2018 0415 Gross per 24 hour  Intake 837 ml  Output -  Net 837 ml   Filed Weights   01/15/18 1616 01/15/18 2343 01/16/18 0415  Weight: 187 lb (84.8 kg) 181 lb 10.5 oz (82.4 kg) 189 lb 9.5 oz (86 kg)   Body mass index is 34.68 kg/m.  General:  Well nourished, well developed, in no acute distress, but chronically ill appearing HEENT: normal Lymph: no adenopathy Neck: 6 cm JVD Endocrine:  No thryomegaly Vascular: No carotid bruits; FA pulses 2+ bilaterally without bruits  Cardiac:  normal S1, S2; RRR; no murmur, heart sounds distant Lungs:  clear to auscultation bilaterally, no wheezing, rhonchi or rales  Abd: soft, nontender, no hepatomegaly  Ext: no edema Musculoskeletal:  No deformities, BUE and BLE strength normal and equal Skin: warm and dry  Neuro:  CNs 2-12 intact, no focal abnormalities noted Psych:  Normal affect   EKG:  None, prior reveal LBBB  Relevant CV Studies:  Echo 2017 (Feb) LVEF 35 to 40%  Severe hypokineis of anterosepal wall   Mild AI.  Mild to mod MR.  RVEF normal   Laboratory Data:  Chemistry Recent Labs  Lab 01/15/18 1700 01/16/18 0509  NA 137 137  K 4.5 4.5  CL 105 108  CO2 22 22  GLUCOSE 104* 120*  BUN 32* 29*  CREATININE 2.19* 2.07*  CALCIUM 9.2 8.5*  GFRNONAA 19* 20*  GFRAA 22* 23*  ANIONGAP 10 7    Recent Labs  Lab 01/15/18 1700  PROT 7.6  ALBUMIN 4.0  AST 62*  ALT 41  ALKPHOS 62  BILITOT 0.2*   Hematology Recent Labs  Lab 01/15/18 1700 01/16/18 0509  WBC 8.1 7.6  RBC 3.32* 3.06*  HGB 11.9* 11.2*  HCT 35.0 32.5*  MCV 105.5* 106.1*  MCH 35.8* 36.7*  MCHC 33.9 34.6  RDW 14.2 14.2  PLT 189 183   Cardiac Enzymes Recent Labs  Lab 01/15/18 1700 01/15/18 1931  TROPONINI 0.06* 0.13*   No results for input(s): TROPIPOC in the last 168 hours.  BNPNo results for input(s): BNP, PROBNP in the last 168 hours.  DDimer No  results for input(s): DDIMER in the last 168 hours.  Radiology/Studies:  Dg Chest Portable 1 View  Result Date: 01/15/2018 CLINICAL DATA:  84 y/o F; 84 y/o F; generalized weakness. Diagnosed with shingles 3-4 days ago. EXAM: PORTABLE CHEST 1 VIEW COMPARISON:  05/14/2017 chest radiograph FINDINGS: Stable cardiac silhouette given projection and technique. Aortic atherosclerosis with calcification. Clear lungs. No pleural effusion or pneumothorax. No acute osseous abnormality is evident. IMPRESSION: No active disease. Electronically Signed   By: Mitzi Hansen M.D.   On: 01/15/2018 17:36    Assessment and Plan:   1. Elevated troponin in the  setting of shingles - the patient has known LV dysfunction and a prior stress induced CM. At this point, no additional cardiac workup is indicated. Specifically I would not recommend a stress test or cardiac cath.  2. Chronic systolic heart failure - she is clinically stable. Continue ARB and her beta blocker 3. Shingles - as per primary team.  4. Disp. - she will need followup with Dr. Kennith Maes whom she has not seen for 2 years.   For questions or updates, please contact [facility] Please consult [email or web address] for contact info under Cardiology/STEMI.   Signed, Lewayne Bunting, MD  01/16/2018 8:58 AM

## 2018-01-17 LAB — GLUCOSE, CAPILLARY: Glucose-Capillary: 102 mg/dL — ABNORMAL HIGH (ref 65–99)

## 2018-01-17 MED ORDER — ENSURE ENLIVE PO LIQD
237.0000 mL | Freq: Three times a day (TID) | ORAL | Status: DC
  Administered 2018-01-17 – 2018-01-19 (×6): 237 mL via ORAL

## 2018-01-17 MED ORDER — RISAQUAD PO CAPS
2.0000 | ORAL_CAPSULE | Freq: Three times a day (TID) | ORAL | Status: DC
  Administered 2018-01-17 – 2018-01-19 (×8): 2 via ORAL
  Filled 2018-01-17 (×13): qty 2

## 2018-01-17 MED ORDER — GUAIFENESIN-DM 100-10 MG/5ML PO SYRP
5.0000 mL | ORAL_SOLUTION | ORAL | Status: DC | PRN
  Administered 2018-01-17 – 2018-01-18 (×2): 5 mL via ORAL
  Filled 2018-01-17 (×2): qty 5

## 2018-01-17 NOTE — Progress Notes (Addendum)
Initial Nutrition Assessment  DOCUMENTATION CODES:   Obesity unspecified  INTERVENTION:  Provide Ensure Enlive po TID, each supplement provides 350 kcal and 20 grams of protein.  Will discontinue Pro-Stat.  Encouraged intake of protein at each meal. May need to consider liberalizing diet if patient's appetite does not improve.  NUTRITION DIAGNOSIS:   Inadequate oral intake related to decreased appetite as evidenced by per patient/family report.  GOAL:   Patient will meet greater than or equal to 90% of their needs  MONITOR:   PO intake, Supplement acceptance, Labs, Weight trends, I & O's  REASON FOR ASSESSMENT:   Consult Assessment of nutrition requirement/status  ASSESSMENT:   84 year old female with PMHx of depression, spinal stenosis, OA, HTN, chronic systolic CHF, chronic back pain, peptic ulcer disease, OP, hx TBI, CKD stage IV who is admitted with acute shingles episode, acute gastroenteritis.   Met with patient at bedside. She was taking her morning medications. She had eaten her dry Cheerios for breakfast but had not had any of her milk yet. Patient reports that she stays in her own condo. She has 24 hour assistance and someone to prepare 3 meals for her daily. She also occasionally gets food from a cafeteria. She reports she typically eats fish or meat and vegetables. She likes cereal for breakfast. She reports anorexia here and that the food does not taste good. Denies any food allergies/intolerances. Did note that she actually does have a shellfish allergy documented in chart.  Patient denies any weight loss. She reports her UBW is 140 lbs, which does not appear to be accurate. Per chart she was 170.6 lbs on 05/13/2017 and has gained weight since then.  Meal Completion: 50% per chart  Medications reviewed and include: acidophilus 2 capsules TID, amiodarone, Eliquis, vitamin D 1000 units daily, Colace, ferrous sulfate 3250 mg BID, levothyroxine, Remeron 15 mg daily,  Ocuvite daily, vitamin B12 100 micrograms daily, vitamin C 500 mg daily, NS @ 50 mL/hr.  Labs reviewed: CBG 102-112, BUN 29, Creatinine 2.07.  Patient does not meet criteria for malnutrition at this time.  RN present in room at time of RD assessment.  NUTRITION - FOCUSED PHYSICAL EXAM:    Most Recent Value  Orbital Region  No depletion  Upper Arm Region  No depletion  Thoracic and Lumbar Region  No depletion  Buccal Region  No depletion  Temple Region  No depletion  Clavicle Bone Region  No depletion  Clavicle and Acromion Bone Region  No depletion  Scapular Bone Region  No depletion  Dorsal Hand  No depletion  Patellar Region  No depletion  Anterior Thigh Region  No depletion  Posterior Calf Region  No depletion  Edema (RD Assessment)  None  Hair  Reviewed  Eyes  Reviewed  Mouth  Reviewed  Skin  Reviewed  Nails  Reviewed     Diet Order:  Diet Heart Room service appropriate? Yes; Fluid consistency: Thin  EDUCATION NEEDS:   Education needs have been addressed  Skin:  Skin Assessment: Reviewed RN Assessment(blister to right abdomen/flank)  Last BM:  01/16/2018 - small type 6  Height:   Ht Readings from Last 1 Encounters:  01/15/18 _0  (1.575 m)    Weight:   Wt Readings from Last 1 Encounters:  01/16/18 189 lb 9.5 oz (86 kg)    Ideal Body Weight:  50 kg  BMI:  Body mass index is 34.68 kg/m.  Estimated Nutritional Needs:   Kcal:  6333-5456 (MSJ  x 1.2-1.4)  Protein:  85-95 grams (1-1.1 grams/kg)  Fluid:  1.25-1.5 L/day (25-30 mL/kg IBW)  Willey Blade, MS, RD, LDN Office: [phone] Pager: [phone] After Hours/Weekend Pager: [phone]

## 2018-01-17 NOTE — Progress Notes (Signed)
[facility] [place] at [facility]   PATIENT NAME: Lisa [name]    MR#:  [id number]  DATE OF BIRTH:  October 24, 1926  SUBJECTIVE:  CHIEF COMPLAINT:   Chief Complaint  Patient presents with  . Weakness  . Herpes Zoster  Concern for left heel previous pressure sore-noted, wound nurse to see, heel protector while in house, awaiting placement  REVIEW OF SYSTEMS:  CONSTITUTIONAL: No fever, fatigue or weakness.  EYES: No blurred or double vision.  EARS, NOSE, AND THROAT: No tinnitus or ear pain.  RESPIRATORY: No cough, shortness of breath, wheezing or hemoptysis.  CARDIOVASCULAR: No chest pain, orthopnea, edema.  GASTROINTESTINAL: No nausea, vomiting, diarrhea or abdominal pain.  GENITOURINARY: No dysuria, hematuria.  ENDOCRINE: No polyuria, nocturia,  HEMATOLOGY: No anemia, easy bruising or bleeding SKIN: No rash or lesion. MUSCULOSKELETAL: No joint pain or arthritis.   NEUROLOGIC: No tingling, numbness, weakness.  PSYCHIATRY: No anxiety or depression.   ROS  DRUG ALLERGIES:   Allergies  Allergen Reactions  . Ambien [Zolpidem Tartrate] Other (See Comments)    Reaction:  Hallucinations   . Daypro [Oxaprozin] Other (See Comments)    GI upset  . Oxycodone Nausea And Vomiting  . Shellfish Allergy Swelling and Other (See Comments)    Pts mouth and face swells.   . Ace Inhibitors Cough  . Norvasc [Amlodipine Besylate] Palpitations    VITALS:  Blood pressure (!) 147/60, pulse 76, temperature 99.3 F (37.4 C), temperature source Oral, resp. rate 20, height  (1.575 m), weight 86 kg (189 lb 9.5 oz), SpO2 97 %.  PHYSICAL EXAMINATION:  GENERAL:  84 y.o.-year-old patient lying in the bed with no acute distress.  EYES: Pupils equal, round, reactive to light and accommodation. No scleral icterus. Extraocular muscles intact.  HEENT: Head atraumatic, normocephalic. Oropharynx and nasopharynx clear.  NECK:  Supple, no jugular venous distention. No thyroid enlargement, no  tenderness.  LUNGS: Normal breath sounds bilaterally, no wheezing, rales,rhonchi or crepitation. No use of accessory muscles of respiration.  CARDIOVASCULAR: S1, S2 normal. No murmurs, rubs, or gallops.  ABDOMEN: Soft, nontender, nondistended. Bowel sounds present. No organomegaly or mass.  EXTREMITIES: No pedal edema, cyanosis, or clubbing.  NEUROLOGIC: Cranial nerves II through XII are intact. Muscle strength 5/5 in all extremities. Sensation intact. Gait not checked.  PSYCHIATRIC: The patient is alert and oriented x 3.  SKIN: No obvious rash, lesion, or ulcer.  Unstageable left heel pressure wound  Physical Exam LABORATORY PANEL:   CBC Recent Labs  Lab 01/16/18 0509  WBC 7.6  HGB 11.2*  HCT 32.5*  PLT 183   ------------------------------------------------------------------------------------------------------------------  Chemistries  Recent Labs  Lab 01/15/18 1700 01/16/18 0509  NA 137 137  K 4.5 4.5  CL 105 108  CO2 22 22  GLUCOSE 104* 120*  BUN 32* 29*  CREATININE 2.19* 2.07*  CALCIUM 9.2 8.5*  AST 62*  --   ALT 41  --   ALKPHOS 62  --   BILITOT 0.2*  --    ------------------------------------------------------------------------------------------------------------------  Cardiac Enzymes Recent Labs  Lab 01/15/18 1700 01/15/18 1931  TROPONINI 0.06* 0.13*   ------------------------------------------------------------------------------------------------------------------  RADIOLOGY:  Dg Chest Portable 1 View  Result Date: 01/15/2018 CLINICAL DATA:  84 y/o F; 84 y/o F; generalized weakness. Diagnosed with shingles 3-4 days ago. EXAM: PORTABLE CHEST 1 VIEW COMPARISON:  05/14/2017 chest radiograph FINDINGS: Stable cardiac silhouette given projection and technique. Aortic atherosclerosis with calcification. Clear lungs. No pleural effusion or pneumothorax. No acute osseous abnormality is  evident. IMPRESSION: No active disease. Electronically Signed   By: Mitzi Hansen M.D.   On: 01/15/2018 17:36    ASSESSMENT AND PLAN:  *Acute shingles episode Stable Continue Valtrex 1000 mg p.o. twice daily and adult pain protocol  *Chronic left heel pressure wound, unstageable-present on admission Wound care nurse consulted Heel protector while in house  *Acute gastroenteritis Resolved  * Acute elevated troponins  Most likely secondary to demand ischemia  Cardiology did see patient while in house-patient with known left ventricular dysfunction/prior stress-induced cardiomyopathy-no additional cardiac work-up was recommended  Continue ARB and BB  *Acute encephalopathy Resolved  * History of traumatic brain injury Stable Continue home psychotropic regiment  * CKD 4 Stable Avoid nephrotoxic medications  *Hypertension, chronic Stable continue home medications  *  Acute immobility syndrome/deconditioning PT did see patient while in-house-recommended SNF   Disposition to skilled nursing facility, tentatively plan for him tomorrow   All the records are reviewed and case discussed with Care Management/Social Workerr. Management plans discussed with the patient, family and they are in agreement.  CODE STATUS: DNR  TOTAL TIME TAKING CARE OF THIS PATIENT: 35 minutes.     POSSIBLE D/C IN 1-2 DAYS, DEPENDING ON CLINICAL CONDITION.   Evelena Asa  Carmack M.D on 01/17/2018   Between 7am to 6pm - Pager - [phone]  After 6pm go to [email or web address] - password [employer]  [facility] [place] [facility]  Office  [phone]  CC: Primary care physician; Kandyce Rud, MD  Note: This dictation was prepared with Dragon dictation along with smaller phrase technology. Any transcriptional errors that result from this process are unintentional.

## 2018-01-18 LAB — GLUCOSE, CAPILLARY: Glucose-Capillary: 121 mg/dL — ABNORMAL HIGH (ref 65–99)

## 2018-01-18 MED ORDER — VALACYCLOVIR HCL 1 G PO TABS: 1000.0000 mg | ORAL_TABLET | Freq: Every day | ORAL | 0 refills | Status: AC

## 2018-01-18 MED ORDER — VALACYCLOVIR HCL 500 MG PO TABS
1000.0000 mg | ORAL_TABLET | Freq: Every day | ORAL | Status: DC
  Administered 2018-01-19: 1000 mg via ORAL
  Filled 2018-01-18: qty 2

## 2018-01-18 NOTE — Consult Note (Signed)
WOC Nurse wound consult note Reason for Consult:full thickness pressure injury to left heel, present on admission and resolving.  Wound type:Full thickness pressure injury.  NEwly epithelialized.  Pressure Injury POA: Yes Measurement: 2 cm x 2 cm pink intact tissue Wound ZHY:QMVHQION healed.  Drainage (amount, consistency, odor) none Periwound:erythema. Dressing procedure/placement/frequency:Heel protection with prevalon boot Will not follow at this time.  Please re-consult if needed.  Maple Hudson RN BSN CWON Pager [phone]

## 2018-01-18 NOTE — Care Management Important Message (Signed)
Important Message  Patient Details  Name: Lisa [name] MRN: [id number] Date of Birth: 01/28/1927   Medicare Important Message Given:  Yes    Gwenette Greet, RN 01/18/2018, 4:25 PM

## 2018-01-18 NOTE — Progress Notes (Signed)
[facility] - [place] at [facility]   PATIENT NAME: Lisa [name]    MR#:  [id number]  DATE OF BIRTH:  Feb 11, 1927  SUBJECTIVE:  CHIEF COMPLAINT:   Chief Complaint  Patient presents with  . Weakness  . Herpes Zoster  wants to go home but her home care agency won't support her anymore REVIEW OF SYSTEMS:  CONSTITUTIONAL: No fever, fatigue or weakness.  EYES: No blurred or double vision.  EARS, NOSE, AND THROAT: No tinnitus or ear pain.  RESPIRATORY: No cough, shortness of breath, wheezing or hemoptysis.  CARDIOVASCULAR: No chest pain, orthopnea, edema.  GASTROINTESTINAL: No nausea, vomiting, diarrhea or abdominal pain.  GENITOURINARY: No dysuria, hematuria.  ENDOCRINE: No polyuria, nocturia,  HEMATOLOGY: No anemia, easy bruising or bleeding SKIN: No rash or lesion. MUSCULOSKELETAL: No joint pain or arthritis.   NEUROLOGIC: No tingling, numbness, weakness.  PSYCHIATRY: No anxiety or depression.   ROS  DRUG ALLERGIES:   Allergies  Allergen Reactions  . Ambien [Zolpidem Tartrate] Other (See Comments)    Reaction:  Hallucinations   . Daypro [Oxaprozin] Other (See Comments)    GI upset  . Oxycodone Nausea And Vomiting  . Shellfish Allergy Swelling and Other (See Comments)    Pts mouth and face swells.   . Ace Inhibitors Cough  . Norvasc [Amlodipine Besylate] Palpitations    VITALS:  Blood pressure (!) 161/63, pulse 88, temperature 98.3 F (36.8 C), temperature source Oral, resp. rate 20, height  (1.575 m), weight 86 kg (189 lb 9.5 oz), SpO2 99 %.  PHYSICAL EXAMINATION:  GENERAL:  84 y.o.-year-old patient lying in the bed with no acute distress.  EYES: Pupils equal, round, reactive to light and accommodation. No scleral icterus. Extraocular muscles intact.  HEENT: Head atraumatic, normocephalic. Oropharynx and nasopharynx clear.  NECK:  Supple, no jugular venous distention. No thyroid enlargement, no tenderness.  LUNGS: Normal breath sounds bilaterally,  no wheezing, rales,rhonchi or crepitation. No use of accessory muscles of respiration.  CARDIOVASCULAR: S1, S2 normal. No murmurs, rubs, or gallops.  ABDOMEN: Soft, nontender, nondistended. Bowel sounds present. No organomegaly or mass.  EXTREMITIES: No pedal edema, cyanosis, or clubbing.  NEUROLOGIC: Cranial nerves II through XII are intact. Muscle strength 5/5 in all extremities. Sensation intact. Gait not checked.  PSYCHIATRIC: The patient is alert and oriented x 3.  SKIN: No obvious rash, lesion, or ulcer.  Unstageable left heel pressure wound  Physical Exam LABORATORY PANEL:   CBC Recent Labs  Lab 01/16/18 0509  WBC 7.6  HGB 11.2*  HCT 32.5*  PLT 183   ------------------------------------------------------------------------------------------------------------------  Chemistries  Recent Labs  Lab 01/15/18 1700 01/16/18 0509  NA 137 137  K 4.5 4.5  CL 105 108  CO2 22 22  GLUCOSE 104* 120*  BUN 32* 29*  CREATININE 2.19* 2.07*  CALCIUM 9.2 8.5*  AST 62*  --   ALT 41  --   ALKPHOS 62  --   BILITOT 0.2*  --    ------------------------------------------------------------------------------------------------------------------  Cardiac Enzymes Recent Labs  Lab 01/15/18 1700 01/15/18 1931  TROPONINI 0.06* 0.13*   ------------------------------------------------------------------------------------------------------------------  RADIOLOGY:  No results found.  ASSESSMENT AND PLAN:  *Acute shingles episode Stable Continue Valtrex 1000 mg p.o. once daily and adult pain protocol  *Chronic left heel pressure wound, unstageable-present on admission Wound care nurse consulted Heel protector while in house  *Acute gastroenteritis Resolved  * Acute elevated troponins  Most likely secondary to demand ischemia  Cardiology did see patient while in house-patient with  known left ventricular dysfunction/prior stress-induced cardiomyopathy-no additional cardiac work-up  was recommended  Continue ARB and BB  *Acute encephalopathy Resolved  * History of traumatic brain injury Stable Continue home psychotropic regiment  * CKD 4 Stable Avoid nephrotoxic medications  *Hypertension, chronic Stable continue home medications  *  Acute immobility syndrome/deconditioning PT did see patient while in-house-recommended SNF    After long d/w her and her son - plan was to go home with HHPT but her homecare agency refused to pick her up and stats per STATE rule they need to have CNA which they don't have and they can't take care of her anymore.  Disposition to skilled nursing facility, tentatively plan for tomorrow if beds available  All the records are reviewed and case discussed with Care Management/Social Workerr. Management plans discussed with the patient, family ( son Edy over phone) and they are in agreement.  CODE STATUS: DNR  TOTAL TIME TAKING CARE OF THIS PATIENT: 35 minutes.     POSSIBLE D/C IN 1 DAYS, DEPENDING ON CLINICAL CONDITION.   Delfino Lovett M.D on 01/18/2018   Between 7am to 6pm - Pager - [phone]  After 6pm go to [email or web address] - password [employer]  [facility] [place] [facility]  Office  [phone]  CC: Primary care physician; Kandyce Rud, MD  Note: This dictation was prepared with Dragon dictation along with smaller phrase technology. Any transcriptional errors that result from this process are unintentional.

## 2018-01-19 LAB — BASIC METABOLIC PANEL
Anion gap: 9 (ref 5–15)
BUN: 32 mg/dL — ABNORMAL HIGH (ref 6–20)
CO2: 20 mmol/L — ABNORMAL LOW (ref 22–32)
Calcium: 8.6 mg/dL — ABNORMAL LOW (ref 8.9–10.3)
Chloride: 105 mmol/L (ref 101–111)
Creatinine, Ser: 1.6 mg/dL — ABNORMAL HIGH (ref 0.44–1.00)
GFR calc Af Amer: 32 mL/min — ABNORMAL LOW (ref >60)
GFR calc non Af Amer: 27 mL/min — ABNORMAL LOW (ref >60)
Glucose, Bld: 125 mg/dL — ABNORMAL HIGH (ref 65–99)
Potassium: 4 mmol/L (ref 3.5–5.1)
Sodium: 134 mmol/L — ABNORMAL LOW (ref 135–145)

## 2018-01-19 LAB — GLUCOSE, CAPILLARY: Glucose-Capillary: 142 mg/dL — ABNORMAL HIGH (ref 65–99)

## 2018-01-19 LAB — CBC
HCT: 32.8 % — ABNORMAL LOW (ref 35.0–47.0)
Hemoglobin: 11.1 g/dL — ABNORMAL LOW (ref 12.0–16.0)
MCH: 36 pg — ABNORMAL HIGH (ref 26.0–34.0)
MCHC: 33.9 g/dL (ref 32.0–36.0)
MCV: 106.2 fL — AB (ref 80.0–100.0)
PLATELETS: 191 10*3/uL (ref 150–440)
RBC: 3.09 MIL/uL — ABNORMAL LOW (ref 3.80–5.20)
RDW: 14.4 % (ref 11.5–14.5)
WBC: 12.4 10*3/uL — AB (ref 3.6–11.0)

## 2018-01-19 NOTE — Clinical Social Work Placement (Signed)
   CLINICAL SOCIAL WORK PLACEMENT  NOTE  Date:  01/19/2018  Patient Details  Name: Lisa [name] MRN: [id number] Date of Birth: 1926/10/13  Clinical Social Work is seeking post-discharge placement for this patient at the Skilled  Nursing Facility level of care (*CSW will initial, date and re-position this form in  chart as items are completed):  Yes   Patient/family provided with [place] Clinical Social Work Department's list of facilities offering this level of care within the geographic area requested by the patient (or if unable, by the patient's family).  Yes   Patient/family informed of their freedom to choose among providers that offer the needed level of care, that participate in Medicare, Medicaid or managed care program needed by the patient, have an available bed and are willing to accept the patient.  Yes   Patient/family informed of [facility]'s ownership interest in [facility] and [facility], as well as of the fact that they are under no obligation to receive care at these facilities.  PASRR submitted to EDS on 01/19/18     PASRR number received on       Existing PASRR number confirmed on 01/19/18     FL2 transmitted to all facilities in geographic area requested by pt/family on 01/19/18     FL2 transmitted to all facilities within larger geographic area on       Patient informed that his/her managed care company has contracts with or will negotiate with certain facilities, including the following:        Yes   Patient/family informed of bed offers received.  Patient chooses bed at [facility]     Physician recommends and patient chooses bed at      Patient to be transferred to [facility] on 01/19/18.  Patient to be transferred to facility by [facility]     Patient family notified on 01/19/18 of transfer.  Name of family member notified:  Ramon Dredge patient's son [phone]     PHYSICIAN Please sign DNR, Please sign  FL2     Additional Comment:    _______________________________________________ Darleene Cleaver, LCSWA 01/19/2018, 6:09 PM

## 2018-01-19 NOTE — Discharge Summary (Signed)
[facility] - [place] at [facility]   PATIENT NAME: Lisa [name]    MR#:  [id number]  DATE OF BIRTH:  04-01-1927  DATE OF ADMISSION:  01/15/2018   ADMITTING PHYSICIAN: Cammy Copa, MD  DATE OF DISCHARGE: No discharge date for patient encounter.  PRIMARY CARE PHYSICIAN: Kandyce Rud, MD   ADMISSION DIAGNOSIS:  Elevated troponin [R74.8] Generalized weakness [R53.1] Tremor, unspecified [R25.1] DISCHARGE DIAGNOSIS:  Active Problems:   Shingles  SECONDARY DIAGNOSIS:   Past Medical History:  Diagnosis Date  . Allergy   . Allergy to shellfish    causes swelling  . Brain bleed (HCC)   . Chronic back pain   . Chronic systolic CHF (congestive heart failure) (HCC)   . Depression    unspecified  . Foot fracture    bilateral  . History of abnormal mammogram    Status post breast biopsy x 2, which were negative.   [name] History of chicken pox   . History of sinus tachycardia 11/1997   With dyspnea on exertion. Workup included a VQ scan which was normal, and ultrasound of her lower extremities, which was normal with no evidence of DVT.  [name] Hypertension   . Nocturnal leg cramps   . Osteoarthritis   . Osteoporosis   . Pelvis fracture (HCC)   . Peptic ulcer disease   . Renal disorder   . Spinal stenosis   . V-tach (HCC)   . Wrist fracture, right    HOSPITAL COURSE:  *Acute shingles episode ContinueValtrex 1000 mg p.o. once daily  *Chronic left heel pressure wound-present on admission full thickness pressure injury to left heel, present on admission and resolving.  Wound type:Full thickness pressure injury.  Newly epithelialized.  Pressure Injury POA: Yes Measurement: 2 cm x 2 cm pink intact tissue Wound NWG:NFAOZHYQ healed.  Drainage (amount, consistency, odor) none Periwound:erythema. Dressing procedure/placement/frequency:Heel protection with prevalon boot  *Acute viral gastroenteritis Resolved  *Acute elevated troponins  - secondary to  demand ischemia  Cardiology did see patient while in house-patient with known left ventricular dysfunction/prior stress-induced cardiomyopathy-no additional cardiac work-up was recommended  ContinueARB and BB  *Acute metabolic encephalopathy: multifactorial, polypharmacy - I've cut back/stopped lots of unneeded meds. Patient and son in agreement Resolved  *History of traumatic brain injury Stable Continue home psychotropic regimen  *CKD 4 Stable & at baseline  *Hypertension, chronic Stable DISCHARGE CONDITIONS:  stable CONSULTS OBTAINED:   DRUG ALLERGIES:   Allergies  Allergen Reactions  . Ambien [Zolpidem Tartrate] Other (See Comments)    Reaction:  Hallucinations   . Daypro [Oxaprozin] Other (See Comments)    GI upset  . Oxycodone Nausea And Vomiting  . Shellfish Allergy Swelling and Other (See Comments)    Pts mouth and face swells.   . Ace Inhibitors Cough  . Norvasc [Amlodipine Besylate] Palpitations   DISCHARGE MEDICATIONS:   Allergies as of 01/19/2018      Reactions   Ambien [zolpidem Tartrate] Other (See Comments)   Reaction:  Hallucinations    Daypro [oxaprozin] Other (See Comments)   GI upset   Oxycodone Nausea And Vomiting   Shellfish Allergy Swelling, Other (See Comments)   Pts mouth and face swells.    Ace Inhibitors Cough   Norvasc [amlodipine Besylate] Palpitations      Medication List    STOP taking these medications   acetaminophen 325 MG tablet Commonly known as:  TYLENOL   albuterol 108 (90 Base) MCG/ACT inhaler Commonly known as:  PROVENTIL HFA;VENTOLIN HFA  alum & mag hydroxide-simeth 400-400-40 MG/5ML suspension Commonly known as:  MAALOX PLUS   B-12 1000 MCG Tabs   baclofen 10 MG tablet Commonly known as:  LIORESAL   Cholecalciferol 1000 units tablet   collagenase ointment Commonly known as:  SANTYL   feeding supplement (PRO-STAT SUGAR FREE 64) Liqd   ferrous sulfate 325 (65 FE) MG EC tablet   gabapentin 100 MG  capsule Commonly known as:  NEURONTIN   ICAPS AREDS 2 Caps   lidocaine 5 % Commonly known as:  LIDODERM   loratadine 10 MG tablet Commonly known as:  CLARITIN   montelukast 10 MG tablet Commonly known as:  SINGULAIR   omeprazole 20 MG capsule Commonly known as:  PRILOSEC   raloxifene 60 MG tablet Commonly known as:  EVISTA   SYSTANE 0.4-0.3 % Soln Generic drug:  Polyethyl Glycol-Propyl Glycol   traMADol 50 MG tablet Commonly known as:  ULTRAM   triamcinolone cream 0.1 % Commonly known as:  KENALOG   vitamin C 500 MG tablet Commonly known as:  ASCORBIC ACID     TAKE these medications   amiodarone 200 MG tablet Commonly known as:  PACERONE TAKE 1/2 TABLET BY MOUTH DAILY What changed:    how much to take  how to take this  when to take this   citalopram 20 MG tablet Commonly known as:  CELEXA Take 20 mg by mouth daily.   ELIQUIS 2.5 MG Tabs tablet Generic drug:  apixaban TAKE ONE TABLET TWICE DAILY   furosemide 20 MG tablet Commonly known as:  LASIX Take 20 mg by mouth daily.   irbesartan 150 MG tablet Commonly known as:  AVAPRO Take 150 mg by mouth daily.   levothyroxine 50 MCG tablet Commonly known as:  SYNTHROID, LEVOTHROID Take 50 mcg by mouth daily before breakfast.   metoprolol tartrate 25 MG tablet Commonly known as:  LOPRESSOR Take 12.5 mg by mouth 2 (two) times daily.   mirtazapine 15 MG tablet Commonly known as:  REMERON Take 15 mg by mouth at bedtime.   primidone 50 MG tablet Commonly known as:  MYSOLINE Take 50 mg by mouth 2 (two) times daily.   risperiDONE 0.25 MG tablet Commonly known as:  RISPERDAL TAKE 1 TABLET BY MOUTH AT BEDTIME   valACYclovir 1000 MG tablet Commonly known as:  VALTREX Take 1 tablet (1,000 mg total) by mouth daily. What changed:  when to take this        DISCHARGE INSTRUCTIONS:   DIET:  Regular diet DISCHARGE CONDITION:  Good ACTIVITY:  Activity as tolerated OXYGEN:  Home Oxygen: No.    Oxygen Delivery: room air DISCHARGE LOCATION:  nursing home - Palliative care to follow   If you experience worsening of your admission symptoms, develop shortness of breath, life threatening emergency, suicidal or homicidal thoughts you must seek medical attention immediately by calling 911 or calling your MD immediately  if symptoms less severe.  You Must read complete instructions/literature along with all the possible adverse reactions/side effects for all the Medicines you take and that have been prescribed to you. Take any new Medicines after you have completely understood and accpet all the possible adverse reactions/side effects.   Please note  You were cared for by a hospitalist during your hospital stay. If you have any questions about your discharge medications or the care you received while you were in the hospital after you are discharged, you can call the unit and asked to speak with the hospitalist on  call if the hospitalist that took care of you is not available. Once you are discharged, your primary care physician will handle any further medical issues. Please note that NO REFILLS for any discharge medications will be authorized once you are discharged, as it is imperative that you return to your primary care physician (or establish a relationship with a primary care physician if you do not have one) for your aftercare needs so that they can reassess your need for medications and monitor your lab values.    On the day of Discharge:  VITAL SIGNS:  Blood pressure (!) 165/71, pulse 97, temperature 98.2 F (36.8 C), resp. rate 19, height  (1.575 m), weight 88.2 kg (194 lb 7.1 oz), SpO2 97 %. PHYSICAL EXAMINATION:  GENERAL:  84 y.o.-year-old patient lying in the bed with no acute distress.  EYES: Pupils equal, round, reactive to light and accommodation. No scleral icterus. Extraocular muscles intact.  HEENT: Head atraumatic, normocephalic. Oropharynx and nasopharynx clear.   NECK:  Supple, no jugular venous distention. No thyroid enlargement, no tenderness.  LUNGS: Normal breath sounds bilaterally, no wheezing, rales,rhonchi or crepitation. No use of accessory muscles of respiration.  CARDIOVASCULAR: S1, S2 normal. No murmurs, rubs, or gallops.  ABDOMEN: Soft, non-tender, non-distended. Bowel sounds present. No organomegaly or mass.  EXTREMITIES: No pedal edema, cyanosis, or clubbing.  NEUROLOGIC: Cranial nerves II through XII are intact. Muscle strength 5/5 in all extremities. Sensation intact. Gait not checked.  PSYCHIATRIC: The patient is alert and oriented x 3.  SKIN: No obvious rash, lesion, or ulcer.  DATA REVIEW:   CBC Recent Labs  Lab 01/19/18 0508  WBC 12.4*  HGB 11.1*  HCT 32.8*  PLT 191    Chemistries  Recent Labs  Lab 01/15/18 1700  01/19/18 0508  NA 137   < > 134*  K 4.5   < > 4.0  CL 105   < > 105  CO2 22   < > 20*  GLUCOSE 104*   < > 125*  BUN 32*   < > 32*  CREATININE 2.19*   < > 1.60*  CALCIUM 9.2   < > 8.6*  AST 62*  --   --   ALT 41  --   --   ALKPHOS 62  --   --   BILITOT 0.2*  --   --    < > = values in this interval not displayed.      Follow-up Information    Kandyce Rud, MD. Go on 01/29/2018.   Specialty:  Family Medicine Why:  Firday at 4:15pm for hospital follow-up Contact information: [address] [facility] [place] [address] [phone]        Morene Crocker, MD. Schedule an appointment as soon as possible for a visit in 2 week(s).   Specialty:  Neurology Contact information: [address] [facility] [place] [address] [phone]          Management plans discussed with the patient, family and they are in agreement.  CODE STATUS: DNR   TOTAL TIME TAKING CARE OF THIS PATIENT: 45 minutes.    Delfino Lovett M.D on 01/19/2018 at 9:10 AM  Between 7am to 6pm - Pager - [phone]  After 6pm go to  [email or web address] - Social research officer, government  [facility] [place] [facility]  Office  [phone]  CC: Primary care physician; Kandyce Rud, MD   Note: This dictation was prepared with  Dragon dictation along with smaller Company secretary. Any transcriptional errors that result from this process are unintentional.

## 2018-01-19 NOTE — [place] FL2 (Signed)
[place] MEDICAID FL2 LEVEL OF CARE SCREENING TOOL     IDENTIFICATION  Patient Name: Lisa [name] Birthdate: 1927-03-08 Sex: female Admission Date (Current Location): 01/15/2018  [place] and [place] Number:  Chiropodist and Address:  [facility], [address], [place], [place] [address]      Provider Number: [id number]  Attending Physician Name and Address:  Delfino Lovett, MD  Relative Name and Phone Number:  Shuntae, Herzig   [phone]     Current Level of Care: Hospital Recommended Level of Care: Skilled Nursing Facility Prior Approval Number:    Date Approved/Denied:   PASRR Number: [id number]  Discharge Plan: SNF    Current Diagnoses: Patient Active Problem List   Diagnosis Date Noted  . Shingles 01/15/2018  . Chronic cough 07/06/2017  . Tremor, unspecified 06/23/2017  . Generalized weakness 06/06/2017  . Protein-calorie malnutrition (HCC) 05/25/2017  . Vitamin D deficiency 05/25/2017  . Urinary tract infection 05/13/2017  . Spondylosis without myelopathy or radiculopathy, lumbar region 05/05/2017  . Right shoulder pain 09/23/2016  . New onset atrial fibrillation (HCC) 12/11/2015  . Altered mental state   . Congestive dilated cardiomyopathy (HCC)   . Dementia   . Headache 12/10/2015  . TIA (transient ischemic attack) 12/10/2015  . Influenza A 11/21/2015  . Ventricular tachycardia (HCC) 11/09/2015  . Constipation 09/12/2015  . Insomnia due to anxiety and fear 09/12/2015  . Depression, controlled 09/12/2015  . Bacterial UTI 08/27/2015  . DVT, lower extremity (HCC) 08/27/2015  . TBI (traumatic brain injury) (HCC) 08/22/2015  . ICH (intracerebral hemorrhage) (HCC)   . Abscess   . Hypomagnesemia   . SDH (subdural hematoma) (HCC)   . Boil of upper extremity   . Pneumonia due to Haemophilus influenzae (HCC)   . Chronic systolic CHF (congestive heart failure) (HCC)   . Acute on chronic renal failure (HCC)   .  Hypokalemia   . Bright red blood per rectum   . Dysphagia   . Decubitus ulcer of left heel, stage 2 08/16/2015  . Acute respiratory failure with hypoxemia (HCC)   . Hypernatremia   . Subdural hematoma (HCC) 08/03/2015  . Lumbar radiculopathy 06/06/2015  . Spinal stenosis, lumbar region, with neurogenic claudication 04/02/2015  . Degenerative lumbar disc 02/15/2015  . Lumbar post-laminectomy syndrome 02/15/2015  . Facet syndrome, lumbar 02/15/2015  . Sacroiliac joint dysfunction 02/15/2015    Orientation RESPIRATION BLADDER Height & Weight     Self, Place, Time  Normal Incontinent Weight: 194 lb 7.1 oz (88.2 kg) Height:   (157.5 cm)  BEHAVIORAL SYMPTOMS/MOOD NEUROLOGICAL BOWEL NUTRITION STATUS      Incontinent Diet(Carb Modified, low sodium)  AMBULATORY STATUS COMMUNICATION OF NEEDS Skin   Limited Assist Verbally Normal                       Personal Care Assistance Level of Assistance  Bathing, Feeding, Dressing Bathing Assistance: Limited assistance Feeding assistance: Independent Dressing Assistance: Limited assistance     Functional Limitations Info  Sight, Hearing, Speech Sight Info: Adequate Hearing Info: Adequate Speech Info: Adequate    SPECIAL CARE FACTORS FREQUENCY  PT (By licensed PT)     PT Frequency: 5x a week              Contractures Contractures Info: Not present    Additional Factors Info  Code Status, Allergies, Psychotropic Code Status Info: DNR Allergies Info: AMBIEN ZOLPIDEM TARTRATE, DAYPRO OXAPROZIN, OXYCODONE, SHELLFISH ALLERGY, ACE  INHIBITORS, NORVASC AMLODIPINE BESYLATE  Psychotropic Info: citalopram (CELEXA) tablet 20 mg and mirtazapine (REMERON) tablet 15 mg and risperiDONE (RISPERDAL) tablet 0.25 mg          Current Medications (01/19/2018):  This is the current hospital active medication list Current Facility-Administered Medications  Medication Dose Route Frequency Provider Last Rate Last Dose  . 0.9 %  sodium  chloride infusion   Intravenous Continuous Salary, Montell D, MD 50 mL/hr at 01/17/18 0956    . acetaminophen (TYLENOL) tablet 650 mg  650 mg Oral Q6H PRN Cammy Copa, MD   650 mg at 01/18/18 1933   Or  . acetaminophen (TYLENOL) suppository 650 mg  650 mg Rectal Q6H PRN Cammy Copa, MD      . acidophilus (RISAQUAD) capsule 2 capsule  2 capsule Oral TID WC Salary, Montell D, MD   2 capsule at 01/19/18 0758  . albuterol (PROVENTIL) (2.5 MG/3ML) 0.083% nebulizer solution 2.5 mg  2.5 mg Inhalation Q6H PRN Cammy Copa, MD   2.5 mg at 01/18/18 0815  . alum & mag hydroxide-simeth (MAALOX/MYLANTA) 200-200-20 MG/5ML suspension 30 mL  30 mL Oral Q4H PRN Cammy Copa, MD      . amiodarone (PACERONE) tablet 100 mg  100 mg Oral Daily Cammy Copa, MD   100 mg at 01/18/18 0951  . apixaban (ELIQUIS) tablet 2.5 mg  2.5 mg Oral BID Cammy Copa, MD   2.5 mg at 01/18/18 2235  . bisacodyl (DULCOLAX) EC tablet 5 mg  5 mg Oral Daily PRN Cammy Copa, MD      . cholecalciferol (VITAMIN D) tablet 1,000 Units  1,000 Units Oral Daily Cammy Copa, MD   1,000 Units at 01/18/18 0950  . citalopram (CELEXA) tablet 20 mg  20 mg Oral Daily Cammy Copa, MD   20 mg at 01/18/18 0951  . docusate sodium (COLACE) capsule 100 mg  100 mg Oral BID Cammy Copa, MD   100 mg at 01/18/18 2235  . feeding supplement (ENSURE ENLIVE) (ENSURE ENLIVE) liquid 237 mL  237 mL Oral TID BM Salary, Montell D, MD   237 mL at 01/18/18 2247  . ferrous sulfate tablet 325 mg  325 mg Oral BID Cammy Copa, MD   325 mg at 01/18/18 2235  . guaiFENesin-dextromethorphan (ROBITUSSIN DM) 100-10 MG/5ML syrup 5 mL  5 mL Oral Q4H PRN Delfino Lovett, MD   5 mL at 01/18/18 1933  . HYDROcodone-acetaminophen (NORCO/VICODIN) 5-325 MG per tablet 1-2 tablet  1-2 tablet Oral Q4H PRN Cammy Copa, MD   2 tablet at 01/18/18 2235  . irbesartan (AVAPRO) tablet 150 mg  150 mg Oral Daily Cammy Copa, MD   150 mg at 01/18/18 0951  . levothyroxine (SYNTHROID,  LEVOTHROID) tablet 50 mcg  50 mcg Oral QAC breakfast Cammy Copa, MD   50 mcg at 01/19/18 0758  . loratadine (CLARITIN) tablet 10 mg  10 mg Oral Daily Cammy Copa, MD   10 mg at 01/18/18 0951  . metoprolol tartrate (LOPRESSOR) tablet 12.5 mg  12.5 mg Oral BID Cammy Copa, MD   12.5 mg at 01/18/18 2235  . mirtazapine (REMERON) tablet 15 mg  15 mg Oral QHS Cammy Copa, MD   15 mg at 01/18/18 2236  . montelukast (SINGULAIR) tablet 10 mg  10 mg Oral QPM Cammy Copa, MD   10 mg at 01/18/18 1929  . multivitamin-lutein (OCUVITE-LUTEIN) capsule 1 capsule  1 capsule Oral BID Cammy Copa, MD   1 capsule at 01/18/18 2235  . ondansetron (  ZOFRAN) tablet 4 mg  4 mg Oral Q6H PRN Cammy Copa, MD       Or  . ondansetron [facility]) injection 4 mg  4 mg Intravenous Q6H PRN Cammy Copa, MD      . pantoprazole (PROTONIX) EC tablet 40 mg  40 mg Oral Daily Cammy Copa, MD   40 mg at 01/18/18 0951  . polyvinyl alcohol (LIQUIFILM TEARS) 1.4 % ophthalmic solution 1 drop  1 drop Both Eyes PRN Cammy Copa, MD   1 drop at 01/18/18 0953  . primidone (MYSOLINE) tablet 50 mg  50 mg Oral BID Cammy Copa, MD   50 mg at 01/18/18 2235  . raloxifene (EVISTA) tablet 60 mg  60 mg Oral Daily Cammy Copa, MD   60 mg at 01/18/18 0953  . risperiDONE (RISPERDAL) tablet 0.25 mg  0.25 mg Oral QHS Cammy Copa, MD   0.25 mg at 01/18/18 2235  . traZODone (DESYREL) tablet 25 mg  25 mg Oral QHS PRN Cammy Copa, MD      . valACYclovir Ralph Dowdy) tablet 1,000 mg  1,000 mg Oral Daily Sherryll Burger, Vipul, MD      . vitamin B-12 (CYANOCOBALAMIN) tablet 100 mcg  100 mcg Oral Daily Cammy Copa, MD   100 mcg at 01/18/18 4098  . vitamin C (ASCORBIC ACID) tablet 500 mg  500 mg Oral Daily Cammy Copa, MD   500 mg at 01/18/18 1191     Discharge Medications: Please see discharge summary for a list of discharge medications.  Relevant Imaging Results:  Relevant Lab Results:   Additional Information SSN [id number]  Darleene Cleaver, [place]

## 2018-01-19 NOTE — Clinical Social Work Note (Signed)
Patient to be d/c'ed today to [facility] room 808.  Patient and family agreeable to plans will transport via ems RN to call report 800 hall nurse [phone].  Patient's son Ramon Dredge is aware that patient will be discharging today.  Windell Moulding, MSW, [facility]resia Majors [phone]

## 2018-01-19 NOTE — Clinical Social Work Note (Addendum)
CSW spoke to patient's son Ramon Dredge, and he was given bed offers.  Patient's son is going to call CSW back with decision on which facility he would like her to go to.  CSW received phone call from patient's son and they would like her to go to [facility] [place].  [facility] and [facility].  Ervin Knack. Breydan Shillingburg, MSW, LCSWA [phone]  01/19/2018 11:22 AM

## 2018-01-19 NOTE — Clinical Social Work Note (Signed)
Clinical Social Work Assessment  Patient Details  Name: Lisa [name] MRN: [id number] Date of Birth: 1926/11/04  Date of referral:  01/18/18               Reason for consult:  Facility Placement                Permission sought to share information with:  Facility Medical sales representative, Family Supports Permission granted to share information::  Yes, Verbal Permission Granted  Name::     Vieva, [place]   [phone]   Agency::  SNF admissions  Relationship::     Contact Information:     Housing/Transportation Living arrangements for the past 2 months:  Single Family Home Source of Information:  Medical Team, Adult Children Patient Interpreter Needed:  None Criminal Activity/Legal Involvement Pertinent to Current Situation/Hospitalization:  No - Comment as needed Significant Relationships:  Adult Children Lives with:  Self, Other (Comment)([place]) Do you feel safe going back to the place where you live?  No Need for family participation in patient care:  Yes (Comment)  Care giving concerns:  Patient's caregivers and her son feel patient needs some short term rehab before she is able to return back home.   Social Worker assessment / plan:  Patient is an 84 year old female who is alert and oriented x3, patient is widowed and lives alone but with caregivers in the home.  Patient had some confusion assessment completed by speaking with patient's son.  Patient's son stated that patient has been to rehab in the past, and she was at [facility].  Patient's son stated he was okay with her there but is interested in different facility.  CSW explained how insurance will pay for stay and what to expect at SNF.  Patient's son expressed understanding, and gave CSW permission to begin bed search in [place].   Employment status:  Retired Health and safety inspector:  Medicare PT Recommendations:  Skilled Nursing Facility Information / Referral to community resources:  Skilled Nursing  Facility  Patient/Family's Response to care:  Patient and family are agreeable to going to SNF for short term rehab.  Patient/Family's Understanding of and Emotional Response to Diagnosis, Current Treatment, and Prognosis:  Patient's son is hopeful she will not have to be in SNF for very long and will able to return home soon.  Emotional Assessment Appearance:  Appears stated age Attitude/Demeanor/Rapport:    Affect (typically observed):    Orientation:  Oriented to Place, Oriented to  Time, Oriented to Self Alcohol / Substance use:  Not Applicable Psych involvement (Current and /or in the community):  No (Comment)  Discharge Needs  Concerns to be addressed:  Care Coordination, Lack of Support Readmission within the last 30 days:  No Current discharge risk:  Lack of support system, Lives alone Barriers to Discharge:  No Barriers Identified   [place] Constable 01/19/2018, 5:35 PM

## 2018-01-19 NOTE — Discharge Instructions (Signed)
Heart Failure °Heart failure means your heart has trouble pumping blood. This makes it hard for your body to work well. Heart failure is usually a long-term (chronic) condition. You must take good care of yourself and follow your doctor's treatment plan. °Follow these instructions at home: °· Take your heart medicine as told by your doctor. °? Do not stop taking medicine unless your doctor tells you to. °? Do not skip any dose of medicine. °? Refill your medicines before they run out. °? Take other medicines only as told by your doctor or pharmacist. °· Stay active if told by your doctor. The elderly and people with severe heart failure should talk with a doctor about physical activity. °· Eat heart-healthy foods. Choose foods that are without trans fat and are low in saturated fat, cholesterol, and salt (sodium). This includes fresh or frozen fruits and vegetables, fish, lean meats, fat-free or low-fat dairy foods, whole grains, and high-fiber foods. Lentils and dried peas and beans (legumes) are also good choices. °· Limit salt if told by your doctor. °· Cook in a healthy way. Roast, grill, broil, bake, poach, steam, or stir-fry foods. °· Limit fluids as told by your doctor. °· Weigh yourself every morning. Do this after you pee (urinate) and before you eat breakfast. Write down your weight to give to your doctor. °· Take your blood pressure and write it down if your doctor tells you to. °· Ask your doctor how to check your pulse. Check your pulse as told. °· Lose weight if told by your doctor. °· Stop smoking or chewing tobacco. Do not use gum or patches that help you quit without your doctor's approval. °· Schedule and go to doctor visits as told. °· Nonpregnant women should have no more than 1 drink a day. Men should have no more than 2 drinks a day. Talk to your doctor about drinking alcohol. °· Stop illegal drug use. °· Stay current with shots (immunizations). °· Manage your health conditions as told by your  doctor. °· Learn to manage your stress. °· Rest when you are tired. °· If it is really hot outside: °? Avoid intense activities. °? Use air conditioning or fans, or get in a cooler place. °? Avoid caffeine and alcohol. °? Wear loose-fitting, lightweight, and light-colored clothing. °· If it is really cold outside: °? Avoid intense activities. °? Layer your clothing. °? Wear mittens or gloves, a hat, and a scarf when going outside. °? Avoid alcohol. °· Learn about heart failure and get support as needed. °· Get help to maintain or improve your quality of life and your ability to care for yourself as needed. °Contact a doctor if: °· You gain weight quickly. °· You are more short of breath than usual. °· You cannot do your normal activities. °· You tire easily. °· You cough more than normal, especially with activity. °· You have any or more puffiness (swelling) in areas such as your hands, feet, ankles, or belly (abdomen). °· You cannot sleep because it is hard to breathe. °· You feel like your heart is beating fast (palpitations). °· You get dizzy or light-headed when you stand up. °Get help right away if: °· You have trouble breathing. °· There is a change in mental status, such as becoming less alert or not being able to focus. °· You have chest pain or discomfort. °· You faint. °This information is not intended to replace advice given to you by your health care provider. Make sure you   discuss any questions you have with your health care provider. Document Released: 06/17/2008 Document Revised: 02/14/2016 Document Reviewed: 10/25/2012 Elsevier Interactive Patient Education  2017 [employer].  Information on my medicine - ELIQUIS (apixaban)  Why was Eliquis prescribed for you? Eliquis was prescribed for you to reduce the risk of a blood clot forming that can cause a stroke if you have a medical condition called atrial fibrillation (a type of irregular heartbeat).  What do You need to know about Eliquis  ? Take your Eliquis TWICE DAILY - one tablet in the morning and one tablet in the evening with or without food. If you have difficulty swallowing the tablet whole please discuss with your pharmacist how to take the medication safely.  Take Eliquis exactly as prescribed by your doctor and DO NOT stop taking Eliquis without talking to the doctor who prescribed the medication.  Stopping may increase your risk of developing a stroke.  Refill your prescription before you run out.  After discharge, you should have regular check-up appointments with your healthcare provider that is prescribing your Eliquis.  In the future your dose may need to be changed if your kidney function or weight changes by a significant amount or as you get older.  What do you do if you miss a dose? If you miss a dose, take it as soon as you remember on the same day and resume taking twice daily.  Do not take more than one dose of ELIQUIS at the same time to make up a missed dose.  Important Safety Information A possible side effect of Eliquis is bleeding. You should call your healthcare provider right away if you experience any of the following: ? Bleeding from an injury or your nose that does not stop. ? Unusual colored urine (red or dark brown) or unusual colored stools (red or black). ? Unusual bruising for unknown reasons. ? A serious fall or if you hit your head (even if there is no bleeding).  Some medicines may interact with Eliquis and might increase your risk of bleeding or clotting while on Eliquis. To help avoid this, consult your healthcare provider or pharmacist prior to using any new prescription or non-prescription medications, including herbals, vitamins, non-steroidal anti-inflammatory drugs (NSAIDs) and supplements.  This website has more information on Eliquis (apixaban): http://www.eliquis.com/eliquis/home

## 2018-01-19 NOTE — Clinical Social Work Note (Signed)
CSW spoke to patient's son Ramon Dredge to discuss SNF placement options for patient.  Patient son stated that patient has been at rehab before, CSW reminded him on the process for trying to find placement for patient and what to expect at SNF.  CSW was given permission to begin bed search in [place].  Ervin Knack. Tadhg Eskew, MSW, [facility]ia Majors [phone]  01/19/2018 9:56 AM

## 2018-02-05 ENCOUNTER — Other Ambulatory Visit: Payer: Self-pay | Admitting: Cardiovascular Disease

## 2018-02-05 ENCOUNTER — Telehealth: Payer: Self-pay

## 2018-02-05 MED ORDER — AMIODARONE HCL 200 MG PO TABS: ORAL_TABLET | ORAL | 0 refills | Status: DC

## 2018-02-05 MED ORDER — AMIODARONE HCL 100 MG PO TABS: 100.0000 mg | ORAL_TABLET | Freq: Every day | ORAL | 0 refills | Status: DC

## 2018-02-05 NOTE — Telephone Encounter (Signed)
Refill sent for amiodarone 100 mg one tablet daily.  

## 2018-02-05 NOTE — Telephone Encounter (Signed)
Refill Request.  

## 2018-02-10 ENCOUNTER — Other Ambulatory Visit
Admission: RE | Admit: 2018-02-10 | Discharge: 2018-02-10 | Disposition: A | Discharge status: Home / Self Care | Payer: Medicare Other | Source: Ambulatory Visit | Attending: Family Medicine | Admitting: Family Medicine

## 2018-02-10 DIAGNOSIS — B029 Zoster without complications: Secondary | ICD-10-CM | POA: Insufficient documentation

## 2018-02-10 DIAGNOSIS — K219 Gastro-esophageal reflux disease without esophagitis: Secondary | ICD-10-CM | POA: Diagnosis present

## 2018-02-10 LAB — URINALYSIS, ROUTINE W REFLEX MICROSCOPIC
BILIRUBIN URINE: NEGATIVE
Glucose, UA: NEGATIVE mg/dL
HGB URINE DIPSTICK: NEGATIVE
Ketones, ur: NEGATIVE mg/dL
NITRITE: NEGATIVE
Protein, ur: NEGATIVE mg/dL
SPECIFIC GRAVITY, URINE: 1.008 (ref 1.005–1.030)
SQUAMOUS EPITHELIAL / LPF: NONE SEEN (ref 0–5)
pH: 5 (ref 5.0–8.0)

## 2018-02-12 ENCOUNTER — Ambulatory Visit: Payer: Medicare Other | Admitting: Cardiovascular Disease

## 2018-02-12 LAB — URINE CULTURE

## 2018-03-15 ENCOUNTER — Other Ambulatory Visit: Payer: Self-pay | Admitting: Cardiovascular Disease

## 2018-03-15 ENCOUNTER — Other Ambulatory Visit: Payer: Self-pay | Admitting: Physical Medicine & Rehabilitation

## 2018-03-15 DIAGNOSIS — F5105 Insomnia due to other mental disorder: Principal | ICD-10-CM

## 2018-03-15 DIAGNOSIS — F409 Phobic anxiety disorder, unspecified: Secondary | ICD-10-CM

## 2018-03-15 NOTE — Telephone Encounter (Signed)
Refill Request.  

## 2018-04-05 ENCOUNTER — Other Ambulatory Visit: Payer: Self-pay | Admitting: Cardiovascular Disease

## 2018-04-26 ENCOUNTER — Other Ambulatory Visit: Payer: Self-pay | Admitting: Physical Medicine & Rehabilitation

## 2018-04-26 ENCOUNTER — Encounter: Payer: Medicare Other | Attending: Physical Medicine & Rehabilitation | Admitting: Physical Medicine & Rehabilitation

## 2018-04-26 ENCOUNTER — Other Ambulatory Visit: Payer: Self-pay | Admitting: Cardiovascular Disease

## 2018-04-26 ENCOUNTER — Encounter: Payer: Self-pay | Admitting: Physical Medicine & Rehabilitation

## 2018-04-26 VITALS — BP 124/80 | HR 82 | Ht 63.0 in | Wt 186.0 lb

## 2018-04-26 DIAGNOSIS — M47816 Spondylosis without myelopathy or radiculopathy, lumbar region: Secondary | ICD-10-CM

## 2018-04-26 DIAGNOSIS — F5105 Insomnia due to other mental disorder: Principal | ICD-10-CM

## 2018-04-26 DIAGNOSIS — F409 Phobic anxiety disorder, unspecified: Secondary | ICD-10-CM

## 2018-04-26 NOTE — Progress Notes (Signed)
Subjective:    Patient ID: Lisa [name], female    DOB: 1927/06/16, 84 y.o.   MRN: [id number]  HPI  Lisa [name] is here in follow-up of her chronic pain.  I last saw her in April.  Since I saw her she was admitted to the hospital with chest pain and elevated troponin levels.  It appears at that point that her propranolol was changed to metoprolol.  This event was also was diagnosed with a right ulnar nerve neuropathy and was placed on gabapentin.  She is also was given a sling of some sort to help with the nerve injury.  Apparently the device is quite bulky and awkward to wear.  She is unable to ambulate or do anything of a functional nature with the brace in place.  She states that nerve conduction studies were done to confirm the diagnosis by neurology.  She does have some concerns that the gabapentin is causing her tremors to be worse.  Her back continues to give her problems but is no better or worse.  She states that she does do some stretching on a regular basis.   Pain Inventory Average Pain 4 Pain Right Now 0 My pain is aching  In the last 24 hours, has pain interfered with the following? General activity na Relation with others na Enjoyment of life na What TIME of day is your pain at its worst? evening Sleep (in general) Good  Pain is worse with: standing Pain improves with: rest Relief from Meds: 1  Mobility use a walker ability to climb steps?  no do you drive?  no  Function retired  Neuro/Psych tremor loss of taste or smell  Prior Studies Any changes since last visit?  no  Physicians involved in your care Any changes since last visit?  no   Family History  Problem Relation Age of Onset  . COPD Mother   . High blood pressure Mother   . Lung disease Mother   . COPD Father   . Heart disease Father   . High blood pressure Father   . Heart attack Father   . Skin cancer Brother    Social History   Socioeconomic History  . Marital status: Widowed   Spouse name: Not on file  . Number of children: 1  . Years of education: 57  . Highest education level: Not on file  Occupational History  . Occupation: retired  Engineer, production  . Financial resource strain: Not on file  . Food insecurity:    Worry: Not on file    Inability: Not on file  . Transportation needs:    Medical: Not on file    Non-medical: Not on file  Tobacco Use  . Smoking status: Never Smoker  . Smokeless tobacco: Never Used  Substance and Sexual Activity  . Alcohol use: Yes    Alcohol/week: 0.0 oz    Comment: occasional wine  . Drug use: No  . Sexual activity: Not on file  Lifestyle  . Physical activity:    Days per week: Not on file    Minutes per session: Not on file  . Stress: Not on file  Relationships  . Social connections:    Talks on phone: Not on file    Gets together: Not on file    Attends religious service: Not on file    Active member of club or organization: Not on file    Attends meetings of clubs or organizations: Not on file  Relationship status: Not on file  Other Topics Concern  . Not on file  Social History Narrative   Admitted to [facility] 05/15/17 - 06/19/17   Widowed   Never smoker   Occasionally drinks wine   DNR   Past Surgical History:  Procedure Laterality Date  . BACK SURGERY N/A   . BREAST LUMPECTOMY N/A   . FRACTURE SURGERY  08/25/2013   ORIF left distal radius fracture.  . WISDOM TOOTH EXTRACTION     Past Medical History:  Diagnosis Date  . Allergy   . Allergy to shellfish    causes swelling  . Brain bleed (HCC)   . Chronic back pain   . Chronic systolic CHF (congestive heart failure) (HCC)   . Depression    unspecified  . Foot fracture    bilateral  . History of abnormal mammogram    Status post breast biopsy x 2, which were negative.   [name] History of chicken pox   . History of sinus tachycardia 11/1997   With dyspnea on exertion. Workup included a VQ scan which was normal, and ultrasound of her  lower extremities, which was normal with no evidence of DVT.  [name] Hypertension   . Nocturnal leg cramps   . Osteoarthritis   . Osteoporosis   . Pelvis fracture (HCC)   . Peptic ulcer disease   . Renal disorder   . Spinal stenosis   . V-tach (HCC)   . Wrist fracture, right    BP 124/80   Pulse 82   Ht 5\' 3"  (1.6 m)   Wt 186 lb (84.4 kg)   SpO2 95%   BMI 32.95 kg/m   Opioid Risk Score:   Fall Risk Score:  `1  Depression screen PHQ 2/9  Depression screen [facility] 2/9 08/24/2017 11/14/2016 10/22/2015 07/24/2015 07/16/2015 06/26/2015 05/22/2015  Decreased Interest 0 1 0 0 0 0 0  Down, Depressed, Hopeless 0 1 0 0 0 0 0  PHQ - 2 Score 0 2 0 0 0 0 0  Altered sleeping - 2 0 - - - -  Tired, decreased energy - 1 0 - - - -  Change in appetite - 0 0 - - - -  Feeling bad or failure about yourself  - 0 0 - - - -  Trouble concentrating - 0 0 - - - -  Moving slowly or fidgety/restless - 0 0 - - - -  Suicidal thoughts - 0 0 - - - -  PHQ-9 Score - 5 0 - - - -  Some recent data might be hidden     Review of Systems  Constitutional: Negative.   HENT: Negative.   Eyes: Negative.   Respiratory: Negative.   Cardiovascular: Negative.   Gastrointestinal: Negative.   Endocrine: Negative.   Genitourinary: Negative.   Musculoskeletal: Positive for arthralgias and myalgias.  Skin: Negative.   Allergic/Immunologic: Negative.   Neurological: Positive for tremors.  Hematological: Negative.   Psychiatric/Behavioral: Negative.   All other systems reviewed and are negative.      Objective:   Physical Exam  General: No acute distress HEENT: EOMI, oral membranes moist Cards: reg rate  Chest: normal effort Abdomen: Soft, NT, ND Skin: dry, intact Extremities: no edema  Musculoskeletal:LB TTP Neurological: HOH,alert and oriented. Reasonable insight and awareness. Strength 4-5/5 in UE and 4-/5 prox LE to 4+/5 distally.  Motor and sensory exam is stable.  Patient rises easily from the table  to stand and uses good technique  when transferring.  Sensory exam is unchanged.  + tremors today. Right elbow +Tinels at ulnar groove  Skin:appears intact Psych:Patient is pleasant and appropriate   Assessment & Plan:  Medical Problem List and Plan: 1. 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  -continuesupervision at baseline  2. Left heel ulcer.   -healed 3. Chronic back pain/Pain Management: lumbar post lami syndrome. [facility] Dr. Metta Clinesrisp.  - again,she has multiple potential pain generators as evidenced by this most recent CT. -respondedto the L3-4 MBB's however.    -reiterated need to continue with HEP -consider ESI in the future if pain recurs or worsens--pain remains controlled at present 4.Tremor:worsened off propranolol   -may consider holding gabapentin to see if that helps with tremor.   -if we want to resume propranolol, I would need to speak with cardiology re: metoprolol 5. Insomnia, ?mild sundowning? -continuelow dose risperdal for sleep/mood, 0.25mg  qhs #30. 6. Right shoulder pain/RTC tendonitis---maintain HEP 7. Depression: celexa 8. Right ulnar nerve injury at elbow    -ELBOW PAD   -avoid bending and pressure on elbow   -continue gabapentin if needed Follow up in4 months.15 minutes of face-to-face patient care time was spent during this visit today.  All questions were encouraged and answered.

## 2018-04-26 NOTE — Telephone Encounter (Signed)
Refill Request.  

## 2018-04-26 NOTE — Patient Instructions (Addendum)
1. CONSIDER HOLDING THE GABAPENTIN TO SEE IF YOUR TREMORS DECREASE. I WOULD HOLD GABAPENTIN FOR 2-3 DAYS TO SEE IF IT MAKES A DIFFERENCE.   2. YOUR PROPRANOLOL (WHICH WAS FOR TREMORS) HAS BEEN REPLACED BY METOPROLOL. METOPROLOL WON'T WORK AS WELL FOR TREMORS.   3. ELBOW PAD FOR RIGHT ELBOW. WEAR AS MUCH AS YOU CAN DURING DAY/NIGHT. AVOID SLEEPING WITH YOUR RIGHT ARM BENT. AVOID SITTING/LEANING ON RIGHT ELBOW WHEN YOUR'E IN YOUR CHAIR. AVOID BENDING YOUR RIGHT ELBOW FOR LONG PERIODS OF TIME AS WELL  4. WORK ON YOUR BACK STRETCHES DAILY. UTILIZE HEAT AND ICE AS NEEDED AS WELL.    PLEASE FEEL FREE TO CALL OUR OFFICE WITH ANY PROBLEMS OR QUESTIONS [phone])

## 2018-05-17 ENCOUNTER — Other Ambulatory Visit: Payer: Self-pay | Admitting: Cardiovascular Disease

## 2018-05-18 ENCOUNTER — Encounter: Payer: Self-pay | Admitting: Cardiovascular Disease

## 2018-05-18 ENCOUNTER — Ambulatory Visit (INDEPENDENT_AMBULATORY_CARE_PROVIDER_SITE_OTHER): Payer: Medicare Other | Admitting: Cardiovascular Disease

## 2018-05-18 VITALS — BP 160/70 | HR 83 | Ht 62.0 in | Wt 183.0 lb

## 2018-05-18 DIAGNOSIS — I5022 Chronic systolic (congestive) heart failure: Secondary | ICD-10-CM

## 2018-05-18 DIAGNOSIS — I10 Essential (primary) hypertension: Secondary | ICD-10-CM | POA: Diagnosis not present

## 2018-05-18 DIAGNOSIS — I480 Paroxysmal atrial fibrillation: Secondary | ICD-10-CM

## 2018-05-18 NOTE — Progress Notes (Signed)
Cardiology Office Note   Date:  05/18/2018   ID:  Lisa [name], DOB 01/16/1927, MRN [id number]  PCP:  Kandyce RudBabaoff, Marcus, MD  Cardiologist:   Lorine BearsMuhammad Arida, MD   Chief Complaint  Patient presents with  . Other    6 month follow up. Patient c/o SOB. Meds reviewed verbally with patient.       History of Present Illness: Lisa [name] is a 84 y.o. female who presents for a follow-up visit regarding chronic systolic heart failure, LBBB and paroxysmal atrial fibrillation.  She presented in November 2016 with subdural and subarachnoid hemorrhage after a fall. She suffered cardiac arrest at that time due to ventricular tachycardia. Troponin was mildly elevated. Echocardiogram showed an ejection fraction of 15-20% with akinesis of the entire anteroseptal myocardium with moderate pulmonary hypertension. She was suspected of having stress-induced cardiomyopathy and was managed conservatively.  A follow-up echocardiogram in February 2017 showed an ejection fraction of 35-40% with severe anteroseptal hypokinesis. She had atrial fibrillation in the setting of acute illness and has been on amiodarone and anticoagulation.  She was hospitalized in May with shingles.  She had mildly elevated troponin at that time but no chest pain.  She was treated conservatively.  She has been doing reasonably well with no recent chest pain.  She has occasional shortness of breath.  No significant leg edema.  The patient did have significant tremors while she was taking gabapentin.  However, she reports resolution of tremor since she stopped the medication.  Past Medical History:  Diagnosis Date  . Allergy   . Allergy to shellfish    causes swelling  . Brain bleed (HCC)   . Chronic back pain   . Chronic systolic CHF (congestive heart failure) (HCC)   . Depression    unspecified  . Foot fracture    bilateral  . History of abnormal mammogram    Status post breast biopsy x 2, which were negative.   [name]. History  of chicken pox   . History of sinus tachycardia 11/1997   With dyspnea on exertion. Workup included a VQ scan which was normal, and ultrasound of her lower extremities, which was normal with no evidence of DVT.  [name]. Hypertension   . Nocturnal leg cramps   . Osteoarthritis   . Osteoporosis   . Pelvis fracture (HCC)   . Peptic ulcer disease   . Renal disorder   . Spinal stenosis   . V-tach (HCC)   . Wrist fracture, right     Past Surgical History:  Procedure Laterality Date  . BACK SURGERY N/A   . BREAST LUMPECTOMY N/A   . FRACTURE SURGERY  08/25/2013   ORIF left distal radius fracture.  . WISDOM TOOTH EXTRACTION       Current Outpatient Medications  Medication Sig Dispense Refill  . amiodarone (PACERONE) 200 MG tablet TAKE 1/2 TABLET EVERY DAY 15 tablet 3  . citalopram (CELEXA) 20 MG tablet Take 20 mg by mouth daily.   4  . ELIQUIS 2.5 MG TABS tablet TAKE ONE TABLET BY MOUTH TWICE DAILY 60 tablet 0  . furosemide (LASIX) 20 MG tablet TAKE ONE TABLET BY MOUTH EVERY DAY 30 tablet 0  . gabapentin (NEURONTIN) 100 MG capsule Take 100 mg by mouth 3 (three) times daily.    . irbesartan (AVAPRO) 150 MG tablet Take 150 mg by mouth daily.     [name]. levothyroxine (SYNTHROID, LEVOTHROID) 50 MCG tablet Take 50 mcg by mouth daily before breakfast.     .  metoprolol tartrate (LOPRESSOR) 25 MG tablet Take 12.5 mg by mouth 2 (two) times daily.   4  . mirtazapine (REMERON) 15 MG tablet Take 15 mg by mouth at bedtime.   4  . primidone (MYSOLINE) 50 MG tablet Take 50 mg by mouth 2 (two) times daily.    . risperiDONE (RISPERDAL) 0.25 MG tablet TAKE ONE TABLET AT BEDTIME 30 tablet 2  . valACYclovir (VALTREX) 1000 MG tablet Take 1 tablet (1,000 mg total) by mouth daily. 30 tablet 0   No current facility-administered medications for this visit.     Allergies:   Ambien [zolpidem tartrate]; Daypro [oxaprozin]; Oxycodone; Shellfish allergy; Ace inhibitors; and Norvasc [amlodipine besylate]    Social  History:  The patient  reports that she has never smoked. She has never used smokeless tobacco. She reports that she drinks alcohol. She reports that she does not use drugs.   Family History:  [name] family history includes COPD in her father and mother; Heart attack in her father; Heart disease in her father; High blood pressure in her father and mother; Lung disease in her mother; Skin cancer in her brother.    ROS:  Please see the history of present illness.   Otherwise, review of systems are positive for none.   All other systems are reviewed and negative.    PHYSICAL EXAM: VS:  BP (!) 160/70 (BP Location: Left Arm, Patient Position: Sitting, Cuff Size: Normal)   Pulse 83   Ht 5\' 2"  (1.575 m)   Wt 183 lb (83 kg)   BMI 33.47 kg/m  , BMI Body mass index is 33.47 kg/m. GEN: Well nourished, well developed, in no acute distress  HEENT: normal  Neck: no JVD, carotid bruits, or masses Cardiac: RRR; no murmurs, rubs, or gallops,Mild edema  Respiratory:  clear to auscultation bilaterally, normal work of breathing GI: soft, nontender, nondistended, + BS MS: no deformity or atrophy  Skin: warm and dry, no rash Neuro:  Strength and sensation are intact Psych: euthymic mood, full affect   EKG:  EKG is ordered today. The ekg ordered today demonstrates normal sinus rhythm with left bundle branch block.   Recent Labs: 01/15/2018: ALT 41 01/19/2018: BUN 32; Creatinine, Ser 1.60; Hemoglobin 11.1; Platelets 191; Potassium 4.0; Sodium 134    Lipid Panel    Component Value Date/Time   CHOL 174 12/10/2015 0537   TRIG 115 12/10/2015 0537   HDL 44 12/10/2015 0537   CHOLHDL 4.0 12/10/2015 0537   VLDL 23 12/10/2015 0537   LDLCALC 107 (H) 12/10/2015 0537      Wt Readings from Last 3 Encounters:  05/18/18 183 lb (83 kg)  04/26/18 186 lb (84.4 kg)  01/19/18 194 lb 7.1 oz (88.2 kg)        ASSESSMENT AND PLAN:  1.  Chronic systolic heart failure: Due to stress-induced  cardiomyopathy versus ischemic cardiomyopathy. Continue treatment with small dose metoprolol and ARB.  She appears to be euvolemic on current dose of furosemide 20 mg once daily.  2. Paroxysmal atrial fibrillation: She is currently in normal sinus rhythm.  No evidence of recurrent atrial fibrillation. Continue small dose amiodarone and Eliquis.  3. Essential hypertension: Blood pressure is mildly elevated.  I made no changes today.  4.  Essential tremor: She reports significant improvement since she stopped taking gabapentin.  If it is felt that propranolol is a better option than metoprolol in controlling her essential tremors, the medication can be switched.  Nonetheless, it appears that  is not an issue at the present time.   Disposition:   FU with me in 6 months  Signed,  Lorine Bears, MD  05/18/2018 4:25 PM     [facility]

## 2018-05-18 NOTE — Patient Instructions (Signed)
Medication Instructions: Your physician recommends that you continue on your current medications as directed. Please refer to the Current Medication list given to you today.  If you need a refill on your cardiac medications before your next appointment, please call your pharmacy.   Follow-Up: Your physician wants you to follow-up in 6 months with Dr. Kirke CorinArida. You will receive a reminder letter in the mail two months in advance. If you don't receive a letter, please call our office at [phone] to schedule this follow-up appointment.  [facility]!      Heart-Healthy Eating Plan Heart-healthy meal planning includes:  Limiting unhealthy fats.  Increasing healthy fats.  Making other small dietary changes.  You may need to talk with your doctor or a diet specialist (dietitian) to create an eating plan that is right for you. What types of fat should I choose?  Choose healthy fats. These include olive oil and canola oil, flaxseeds, walnuts, almonds, and seeds.  Eat more omega-3 fats. These include salmon, mackerel, sardines, tuna, flaxseed oil, and ground flaxseeds. Try to eat fish at least twice each week.  Limit saturated fats. ? Saturated fats are often found in animal products, such as meats, butter, and cream. ? Plant sources of saturated fats include palm oil, palm kernel oil, and coconut oil.  Avoid foods with partially hydrogenated oils in them. These include stick margarine, some tub margarines, cookies, crackers, and other baked goods. These contain trans fats. What general guidelines do I need to follow?  Check food labels carefully. Identify foods with trans fats or high amounts of saturated fat.  Fill one half of your plate with vegetables and green salads. Eat 4-5 servings of vegetables per day. A serving of vegetables is: ? 1 cup of raw leafy vegetables. ?  cup of raw or cooked cut-up vegetables. ?  cup of vegetable  juice.  Fill one fourth of your plate with whole grains. Look for the word "whole" as the first word in the ingredient list.  Fill one fourth of your plate with lean protein foods.  Eat 4-5 servings of fruit per day. A serving of fruit is: ? One medium whole fruit. ?  cup of dried fruit. ?  cup of fresh, frozen, or canned fruit. ?  cup of 100% fruit juice.  Eat more foods that contain soluble fiber. These include apples, broccoli, carrots, beans, peas, and barley. Try to get 20-30 g of fiber per day.  Eat more home-cooked food. Eat less restaurant, buffet, and fast food.  Limit or avoid alcohol.  Limit foods high in starch and sugar.  Avoid fried foods.  Avoid frying your food. Try baking, boiling, grilling, or broiling it instead. You can also reduce fat by: ? Removing the skin from poultry. ? Removing all visible fats from meats. ? Skimming the fat off of stews, soups, and gravies before serving them. ? Steaming vegetables in water or broth.  Lose weight if you are overweight.  Eat 4-5 servings of nuts, legumes, and seeds per week: ? One serving of dried beans or legumes equals  cup after being cooked. ? One serving of nuts equals 1 ounces. ? One serving of seeds equals  ounce or one tablespoon.  You may need to keep track of how much salt or sodium you eat. This is especially true if you have high blood pressure. Talk with your doctor or dietitian to get more information. What foods can I eat? Grains Breads, including  [place], white, pita, wheat, raisin, rye, oatmeal, and [place]. Tortillas that are neither fried nor made with lard or trans fat. Low-fat rolls, including hotdog and hamburger buns and English muffins. Biscuits. Muffins. Waffles. Pancakes. Light popcorn. Whole-grain cereals. Flatbread. Melba toast. Pretzels. Breadsticks. Rusks. Low-fat snacks. Low-fat crackers, including oyster, saltine, matzo, graham, animal, and rye. Rice and pasta, including brown rice  and pastas that are made with whole wheat. Vegetables All vegetables. Fruits All fruits, but limit coconut. Meats and Other Protein Sources Lean, well-trimmed beef, veal, pork, and lamb. Chicken and [place] without skin. All fish and shellfish. Wild duck, rabbit, pheasant, and venison. Egg whites or low-cholesterol egg substitutes. Dried beans, peas, lentils, and tofu. Seeds and most nuts. Dairy Low-fat or nonfat cheeses, including ricotta, string, and mozzarella. Skim or 1% milk that is liquid, powdered, or evaporated. Buttermilk that is made with low-fat milk. Nonfat or low-fat yogurt. Beverages Mineral water. Diet carbonated beverages. Sweets and Desserts Sherbets and fruit ices. Honey, jam, marmalade, jelly, and syrups. Meringues and gelatins. Pure sugar candy, such as hard candy, jelly beans, gumdrops, mints, marshmallows, and small amounts of dark chocolate. [employer]. Eat all sweets and desserts in moderation. Fats and Oils Nonhydrogenated (trans-free) margarines. Vegetable oils, including soybean, sesame, sunflower, olive, peanut, safflower, corn, canola, and cottonseed. Salad dressings or mayonnaise made with a vegetable oil. Limit added fats and oils that you use for cooking, baking, salads, and as spreads. Other Cocoa powder. Coffee and tea. All seasonings and condiments. The items listed above may not be a complete list of recommended foods or beverages. Contact your dietitian for more options. What foods are not recommended? Grains Breads that are made with saturated or trans fats, oils, or whole milk. Croissants. Butter rolls. Cheese breads. Sweet rolls. Donuts. Buttered popcorn. Chow mein noodles. High-fat crackers, such as cheese or butter crackers. Meats and Other Protein Sources Fatty meats, such as hotdogs, short ribs, sausage, spareribs, bacon, rib eye roast or steak, and mutton. High-fat deli meats, such as salami and bologna. Caviar. Domestic duck and goose. Organ  meats, such as kidney, liver, sweetbreads, and heart. Dairy Cream, sour cream, cream cheese, and creamed cottage cheese. Whole-milk cheeses, including blue (bleu), [address], [place], [place], [address], [place], [address], cheddar, [place], and [place]. Whole or 2% milk that is liquid, evaporated, or condensed. Whole buttermilk. Cream sauce or high-fat cheese sauce. Yogurt that is made from whole milk. Beverages Regular sodas and juice drinks with added sugar. Sweets and Desserts Frosting. Pudding. Cookies. Cakes other than angel food cake. Candy that has milk chocolate or white chocolate, hydrogenated fat, butter, coconut, or unknown ingredients. Buttered syrups. Full-fat ice cream or ice cream drinks. Fats and Oils Gravy that has suet, meat fat, or shortening. Cocoa butter, hydrogenated oils, palm oil, coconut oil, palm kernel oil. These can often be found in baked products, candy, fried foods, nondairy creamers, and whipped toppings. Solid fats and shortenings, including bacon fat, salt pork, lard, and butter. Nondairy cream substitutes, such as coffee creamers and sour cream substitutes. Salad dressings that are made of unknown oils, cheese, or sour cream. The items listed above may not be a complete list of foods and beverages to avoid. Contact your dietitian for more information. This information is not intended to replace advice given to you by your health care provider. Make sure you discuss any questions you have with your health care provider. Document Released: 03/09/2012 Document Revised: 02/14/2016 Document Reviewed: 03/02/2014 Elsevier Interactive Patient Education  2018 Elsevier  Inc.

## 2018-06-09 ENCOUNTER — Other Ambulatory Visit: Payer: Self-pay | Admitting: Cardiovascular Disease

## 2018-06-10 ENCOUNTER — Other Ambulatory Visit: Payer: Self-pay | Admitting: *Deleted

## 2018-06-10 NOTE — Telephone Encounter (Signed)
Please review for refill on Eliquis  

## 2018-06-11 ENCOUNTER — Other Ambulatory Visit: Payer: Self-pay | Admitting: Cardiovascular Disease

## 2018-07-01 ENCOUNTER — Other Ambulatory Visit: Payer: Self-pay | Admitting: Cardiovascular Disease

## 2018-07-01 ENCOUNTER — Other Ambulatory Visit: Payer: Self-pay | Admitting: Physical Medicine & Rehabilitation

## 2018-07-01 DIAGNOSIS — F409 Phobic anxiety disorder, unspecified: Secondary | ICD-10-CM

## 2018-07-01 DIAGNOSIS — F5105 Insomnia due to other mental disorder: Principal | ICD-10-CM

## 2018-07-19 ENCOUNTER — Other Ambulatory Visit: Payer: Self-pay | Admitting: Emergency Medicine

## 2018-08-17 IMAGING — DX DG FOOT COMPLETE 3+V*L*
3 series · 3 of 3 positions shown · non-contrast
Comparison: August 06, 2014.

CLINICAL DATA: Second through fifth toe pain.

EXAM:
LEFT FOOT - COMPLETE 3+ VIEW

[foot ap]
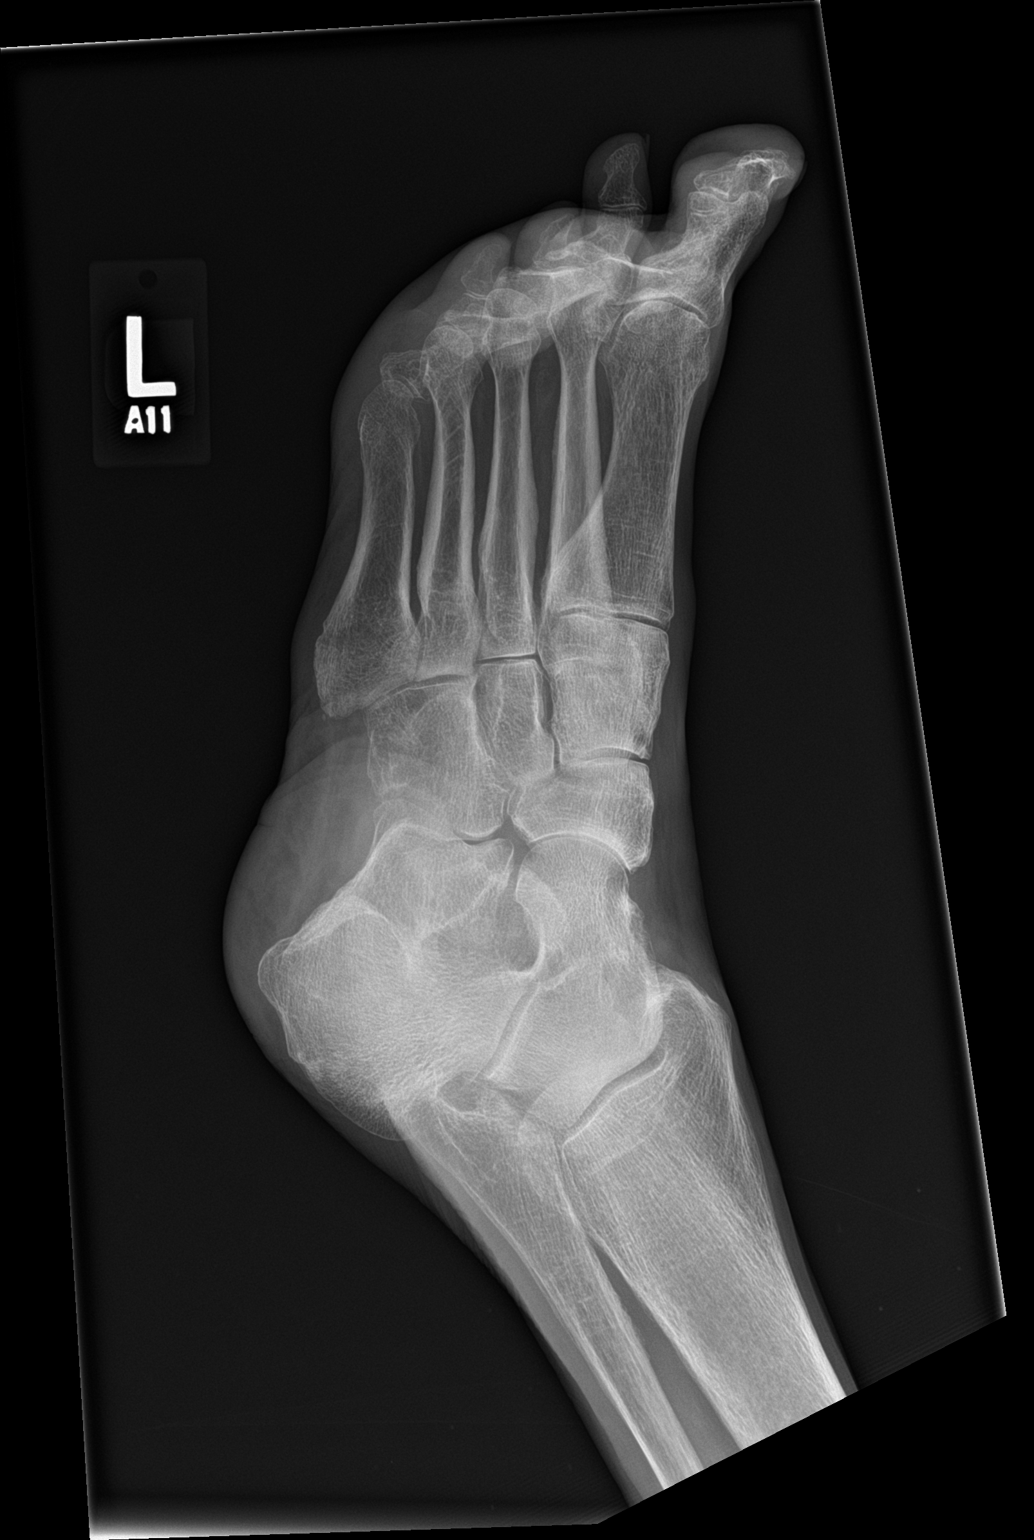

[foot obl]
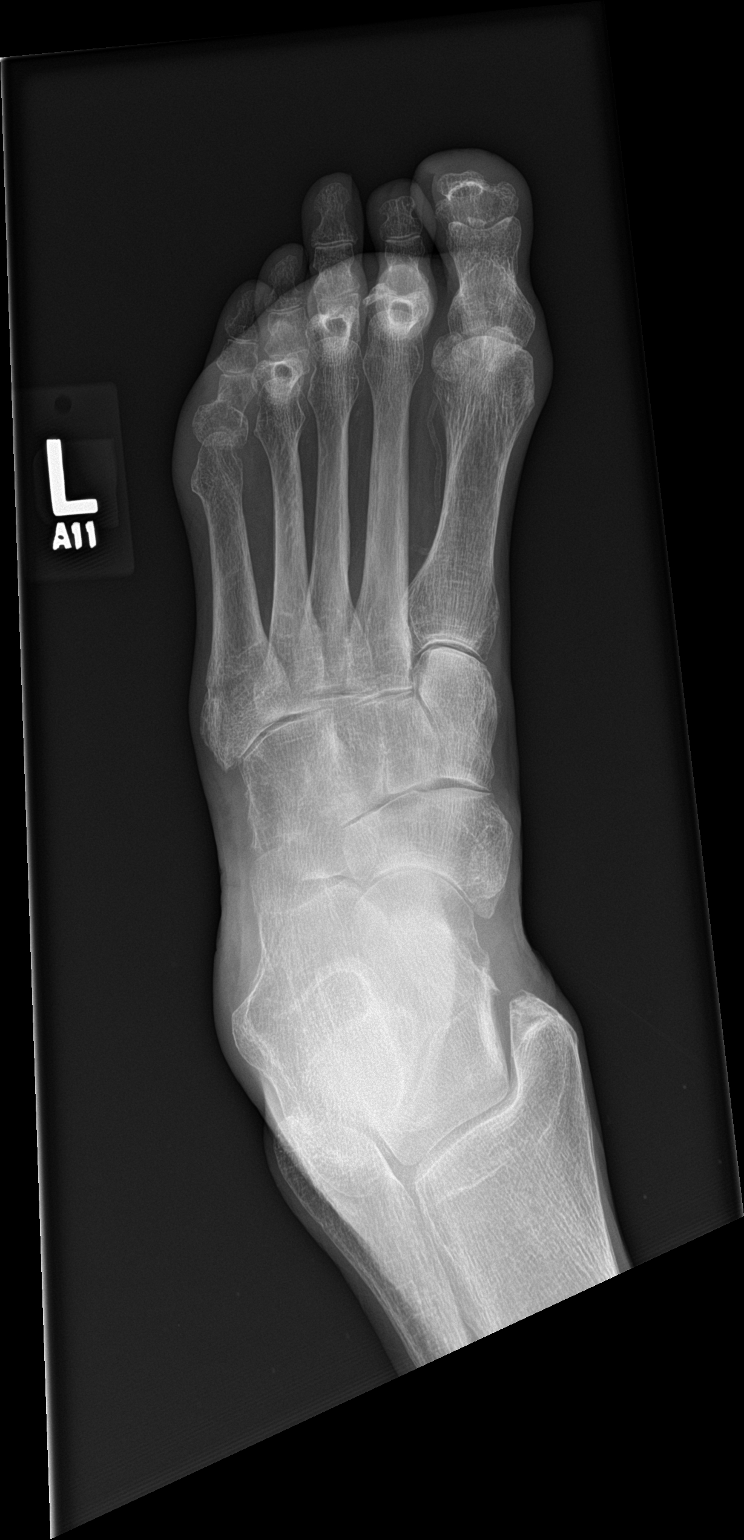

[foot lat]
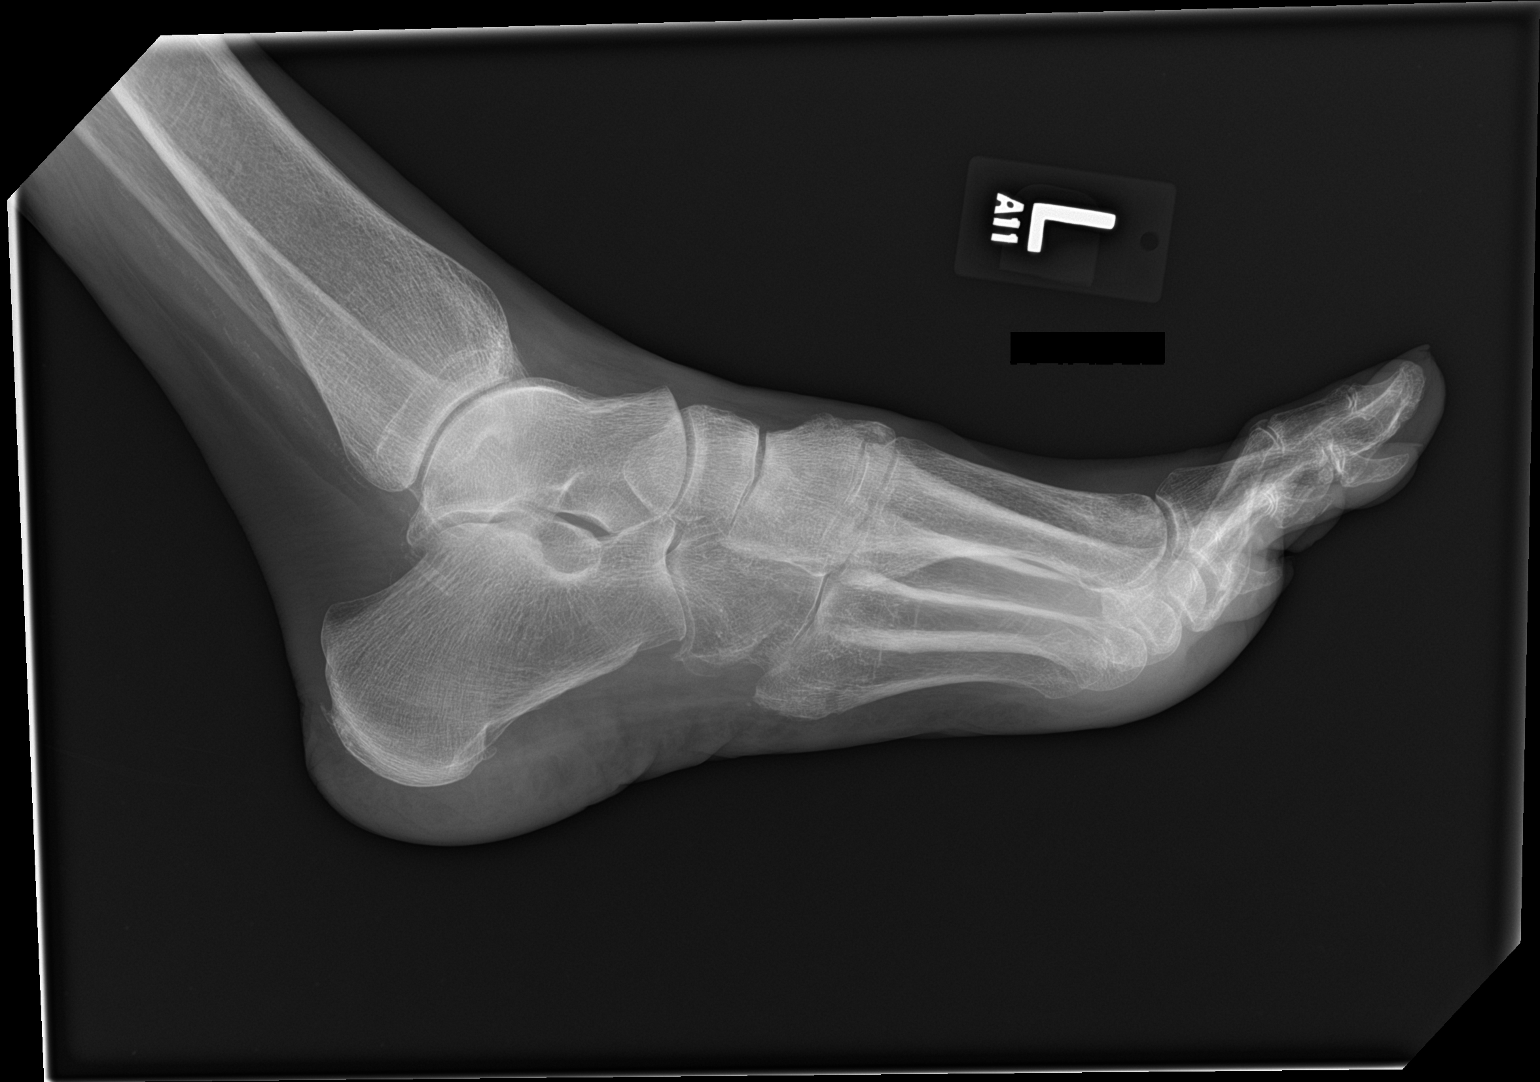

[3 of 3 positions shown; findings below may reference images not displayed]

FINDINGS: There is no evidence of fracture or dislocation. Healed fracture of
the base of the fifth metatarsal. Mild midfoot degenerative changes.
Small plantar and Achilles enthesophytes, unchanged. Osteopenia.
Vascular calcifications. Soft tissues are unremarkable.
IMPRESSION: 1.  No acute osseous abnormality.

## 2018-08-24 ENCOUNTER — Encounter: Payer: Self-pay | Admitting: Physical Medicine & Rehabilitation

## 2018-08-24 ENCOUNTER — Encounter: Payer: Medicare Other | Attending: Physical Medicine & Rehabilitation | Admitting: Physical Medicine & Rehabilitation

## 2018-08-24 ENCOUNTER — Other Ambulatory Visit: Payer: Self-pay

## 2018-08-24 VITALS — BP 146/77 | HR 74 | Ht 62.0 in | Wt 186.0 lb

## 2018-08-24 DIAGNOSIS — R4189 Other symptoms and signs involving cognitive functions and awareness: Secondary | ICD-10-CM | POA: Insufficient documentation

## 2018-08-24 DIAGNOSIS — M47816 Spondylosis without myelopathy or radiculopathy, lumbar region: Secondary | ICD-10-CM

## 2018-08-24 DIAGNOSIS — Z8249 Family history of ischemic heart disease and other diseases of the circulatory system: Secondary | ICD-10-CM | POA: Insufficient documentation

## 2018-08-24 DIAGNOSIS — Z66 Do not resuscitate: Secondary | ICD-10-CM | POA: Insufficient documentation

## 2018-08-24 DIAGNOSIS — G8929 Other chronic pain: Secondary | ICD-10-CM | POA: Diagnosis not present

## 2018-08-24 DIAGNOSIS — M549 Dorsalgia, unspecified: Secondary | ICD-10-CM | POA: Diagnosis not present

## 2018-08-24 DIAGNOSIS — Z1331 Encounter for screening for depression: Secondary | ICD-10-CM | POA: Diagnosis not present

## 2018-08-24 DIAGNOSIS — M25511 Pain in right shoulder: Secondary | ICD-10-CM | POA: Insufficient documentation

## 2018-08-24 DIAGNOSIS — G4700 Insomnia, unspecified: Secondary | ICD-10-CM | POA: Insufficient documentation

## 2018-08-24 DIAGNOSIS — R439 Unspecified disturbances of smell and taste: Secondary | ICD-10-CM | POA: Insufficient documentation

## 2018-08-24 DIAGNOSIS — R269 Unspecified abnormalities of gait and mobility: Secondary | ICD-10-CM | POA: Diagnosis not present

## 2018-08-24 DIAGNOSIS — M5416 Radiculopathy, lumbar region: Secondary | ICD-10-CM

## 2018-08-24 DIAGNOSIS — R251 Tremor, unspecified: Secondary | ICD-10-CM | POA: Insufficient documentation

## 2018-08-24 DIAGNOSIS — S5401XS Injury of ulnar nerve at forearm level, right arm, sequela: Secondary | ICD-10-CM | POA: Diagnosis not present

## 2018-08-24 DIAGNOSIS — I110 Hypertensive heart disease with heart failure: Secondary | ICD-10-CM | POA: Diagnosis not present

## 2018-08-24 DIAGNOSIS — I5022 Chronic systolic (congestive) heart failure: Secondary | ICD-10-CM | POA: Insufficient documentation

## 2018-08-24 DIAGNOSIS — F329 Major depressive disorder, single episode, unspecified: Secondary | ICD-10-CM | POA: Insufficient documentation

## 2018-08-24 DIAGNOSIS — M1990 Unspecified osteoarthritis, unspecified site: Secondary | ICD-10-CM | POA: Insufficient documentation

## 2018-08-24 MED ORDER — GABAPENTIN 100 MG PO CAPS: 100.0000 mg | ORAL_CAPSULE | Freq: Every day | ORAL | 3 refills | Status: DC

## 2018-08-24 NOTE — Patient Instructions (Signed)
USE YOUR GABAPENTIN AT NIGHT TIME ONLY   YOU MAY USE TYLENOL  DURING THE DAY AS NEEDED----MAX DOSE PER DAY OF: 2000MG 

## 2018-08-24 NOTE — Progress Notes (Signed)
Subjective:    Patient ID: Lisa [name], female    DOB: 07/23/1927, 84 y.o.   MRN: [id number]  HPI   Lisa [name] is here in follow up of her chronic pain. Things have been fairly stable. She states that her right arm is doing a bit better overall.  When I saw her last time there was some question about her propranolol and gabapentin dosing.  It sounds as if her primary may have taken her off both of these ultimately.  Since coming off the gabapentin she has had more tingling in her arm.  Tylenol does help during the day but does not seem to be effective at nighttime.  She denies any new health problems.  She denies any falls.  She uses a rolling walker for balance.  Pain Inventory Average Pain 5 Pain Right Now 5 My pain is aching  In the last 24 hours, has pain interfered with the following? General activity 5 Relation with others 5 Enjoyment of life 5 What TIME of day is your pain at its worst? daytime Sleep (in general) Fair  Pain is worse with: standing Pain improves with: rest Relief from Meds: no meds  Mobility use a walker how many minutes can you walk? 15 ability to climb steps?  no do you drive?  no Do you have any goals in this area?  no  Function retired I need assistance with the following:  bathing and meal prep  Neuro/Psych tremor loss of taste or smell  Prior Studies Any changes since last visit?  no nerve study  Physicians involved in your care Any changes since last visit?  no Primary care Dr. Madelin RearBaboff Neurologist Dr. Malvin JohnsPotter   Family History  Problem Relation Age of Onset  . COPD Mother   . High blood pressure Mother   . Lung disease Mother   . COPD Father   . Heart disease Father   . High blood pressure Father   . Heart attack Father   . Skin cancer Brother    Social History   Socioeconomic History  . Marital status: Widowed    Spouse name: Not on file  . Number of children: 1  . Years of education: 1312  . Highest education level: Not on file   Occupational History  . Occupation: retired  Engineer, productionocial Needs  . Financial resource strain: Not on file  . Food insecurity:    Worry: Not on file    Inability: Not on file  . Transportation needs:    Medical: Not on file    Non-medical: Not on file  Tobacco Use  . Smoking status: Never Smoker  . Smokeless tobacco: Never Used  Substance and Sexual Activity  . Alcohol use: Yes    Alcohol/week: 0.0 standard drinks    Comment: occasional wine  . Drug use: No  . Sexual activity: Not on file  Lifestyle  . Physical activity:    Days per week: Not on file    Minutes per session: Not on file  . Stress: Not on file  Relationships  . Social connections:    Talks on phone: Not on file    Gets together: Not on file    Attends religious service: Not on file    Active member of club or organization: Not on file    Attends meetings of clubs or organizations: Not on file    Relationship status: Not on file  Other Topics Concern  . Not on file  Social History  Narrative   Admitted to [facility] 05/15/17 - 06/19/17   Widowed   Never smoker   Occasionally drinks wine   DNR   Past Surgical History:  Procedure Laterality Date  . BACK SURGERY N/A   . BREAST LUMPECTOMY N/A   . FRACTURE SURGERY  08/25/2013   ORIF left distal radius fracture.  . WISDOM TOOTH EXTRACTION     Past Medical History:  Diagnosis Date  . Allergy   . Allergy to shellfish    causes swelling  . Brain bleed (HCC)   . Chronic back pain   . Chronic systolic CHF (congestive heart failure) (HCC)   . Depression    unspecified  . Foot fracture    bilateral  . History of abnormal mammogram    Status post breast biopsy x 2, which were negative.   [name] History of chicken pox   . History of sinus tachycardia 11/1997   With dyspnea on exertion. Workup included a VQ scan which was normal, and ultrasound of her lower extremities, which was normal with no evidence of DVT.  [name] Hypertension   . Nocturnal leg cramps     . Osteoarthritis   . Osteoporosis   . Pelvis fracture (HCC)   . Peptic ulcer disease   . Renal disorder   . Spinal stenosis   . V-tach (HCC)   . Wrist fracture, right    BP (!) 146/77   Pulse 74   Ht 5\' 2"  (1.575 m)   Wt 186 lb (84.4 kg)   SpO2 96%   BMI 34.02 kg/m   Opioid Risk Score:   Fall Risk Score:  `1  Depression screen PHQ 2/9  Depression screen [facility] 2/9 08/24/2018 08/24/2017 11/14/2016 10/22/2015 07/24/2015 07/16/2015 06/26/2015  Decreased Interest 0 0 1 0 0 0 0  Down, Depressed, Hopeless 0 0 1 0 0 0 0  PHQ - 2 Score 0 0 2 0 0 0 0  Altered sleeping - - 2 0 - - -  Tired, decreased energy - - 1 0 - - -  Change in appetite - - 0 0 - - -  Feeling bad or failure about yourself  - - 0 0 - - -  Trouble concentrating - - 0 0 - - -  Moving slowly or fidgety/restless - - 0 0 - - -  Suicidal thoughts - - 0 0 - - -  PHQ-9 Score - - 5 0 - - -  Some recent data might be hidden    Review of Systems  Constitutional: Positive for unexpected weight change.  HENT: Negative.   Eyes: Negative.   Respiratory: Positive for shortness of breath and wheezing.   Cardiovascular: Negative.   Gastrointestinal: Negative.   Endocrine: Negative.   Genitourinary: Negative.   Musculoskeletal: Negative.   Skin: Negative.   Allergic/Immunologic: Negative.   Neurological: Negative.   Hematological: Negative.   Psychiatric/Behavioral: Negative.   All other systems reviewed and are negative.      Objective:   Physical Exam  General: No acute distress HEENT: EOMI, oral membranes moist Cards: reg rate  Chest: normal effort Abdomen: Soft, NT, ND Skin: dry, intact Extremities: no edema Musculoskeletal:LB TTP Neurological: HOH,alert and oriented. Reasonable insight and awareness. Strength is 4 to 4+ out of 5 bilaterally upper and lower extremities.  She transfers much more easily today.  She walks nicely with her rolling walker with good weight shift and swelling of the legs.  She  has a mildly positive  Tinel's test at the right ulnar groove.  Sensory exam is intact otherwise. Skin:appears intact Psych:Patient is pleasant and appropriate   Assessment & Plan:  Medical Problem List and Plan: 1. 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  -continuesupervision at baseline--no changes 2. Left heel ulcer.  -healed 3. Chronic back pain/Pain Management: lumbar post lami syndrome. Last [facility] 07/16/15 by Dr. Metta Clines.  - respondedto the L3-4 MBB's however.               -reiterated need to continue with HEP -consider ESI in the future if pain recurs or worsens--pain remains well controlled at present 4.Tremor:worsened off propranolol initially---improved somewhat today  -remains on primidone 5. Insomnia, ?mild sundowning?   -continuelow dose risperdal for sleep/mood, 0.25mg  qhs #30. 6. Right shoulder pain/RTC tendonitis---maintain HEP 7. Depression: celexa 8. Right ulnar nerve injury at elbow                -ELBOW PAD prn.    -resume LOW dose gabapentin at night 100mg --- I think this is entirely reasonable.   -tylenol as needed  Follow up in84months.15 minutes of face-to-face patient care time was spent during this visit today. All questions were encouraged and answered.

## 2018-09-06 ENCOUNTER — Other Ambulatory Visit: Payer: Self-pay | Admitting: Cardiovascular Disease

## 2018-11-24 ENCOUNTER — Other Ambulatory Visit: Payer: Self-pay | Admitting: Cardiovascular Disease

## 2018-11-24 ENCOUNTER — Other Ambulatory Visit: Payer: Self-pay | Admitting: Physical Medicine & Rehabilitation

## 2018-11-24 DIAGNOSIS — F5105 Insomnia due to other mental disorder: Principal | ICD-10-CM

## 2018-11-24 DIAGNOSIS — F409 Phobic anxiety disorder, unspecified: Secondary | ICD-10-CM

## 2018-12-15 ENCOUNTER — Other Ambulatory Visit: Payer: Self-pay | Admitting: Cardiovascular Disease

## 2018-12-15 NOTE — Telephone Encounter (Signed)
Eliquis 2.5mg  refill request received; pt is 85 yrs old, wt-84.4kg, Crea-1.60 on 01/19/2018, last seen by Dr. Kirke Corin on 05/18/2018 and per OV note pt needed to follow up in 6 months post that appt; will send in a 3 month supply to requested pharmacy and note pt needs an appt.

## 2019-02-28 ENCOUNTER — Ambulatory Visit: Payer: Medicare Other | Admitting: Physical Medicine & Rehabilitation

## 2019-03-09 ENCOUNTER — Telehealth: Payer: Self-pay

## 2019-03-09 NOTE — Telephone Encounter (Signed)
Virtual Visit Pre-Appointment Phone Call  "Lisa [name], I am calling you today to discuss your upcoming appointment. We are currently trying to limit exposure to the virus that causes COVID-19 by seeing patients at home rather than in the office."  1. "What is the BEST phone number to call the day of the visit?" - include this in appointment notes  2. Do you have or have access to (through a family member/friend) a smartphone with video capability that we can use for your visit?" a. If yes - list this number in appt notes as cell (if different from BEST phone #) and list the appointment type as a VIDEO visit in appointment notes b. If no - list the appointment type as a PHONE visit in appointment notes  3. Confirm consent - "In the setting of the current Covid19 crisis, you are scheduled for a phone visit with your provider on 03/22/2019 at 2:40PM.  Just as we do with many in-office visits, in order for you to participate in this visit, we must obtain consent.  If you'd like, I can send this to your mychart (if signed up) or email for you to review.  Otherwise, I can obtain your verbal consent now.  All virtual visits are billed to your insurance company just like a normal visit would be.  By agreeing to a virtual visit, we'd like you to understand that the technology does not allow for your provider to perform an examination, and thus may limit your provider's ability to fully assess your condition. If your provider identifies any concerns that need to be evaluated in person, we will make arrangements to do so.  Finally, though the technology is pretty good, we cannot assure that it will always work on either your or our end, and in the setting of a video visit, we may have to convert it to a phone-only visit.  In either situation, we cannot ensure that we have a secure connection.  Are you willing to proceed?" STAFF: Did the patient verbally acknowledge consent to telehealth visit? Document YES/NO here:  YES  4. Advise patient to be prepared - "Two hours prior to your appointment, go ahead and check your blood pressure, pulse, oxygen saturation, and your weight (if you have the equipment to check those) and write them all down. When your visit starts, your provider will ask you for this information. If you have an Apple Watch or Kardia device, please plan to have heart rate information ready on the day of your appointment. Please have a pen and paper handy nearby the day of the visit as well."  5. Give patient instructions for MyChart download to smartphone OR [email or web address] as below if video visit (depending on what platform provider is using)  6. Inform patient they will receive a phone call 15 minutes prior to their appointment time (may be from unknown caller ID) so they should be prepared to answer    TELEPHONE CALL NOTE  Lisa [name] has been deemed a candidate for a follow-up tele-health visit to limit community exposure during the Covid-19 pandemic. I spoke with the patient via phone to ensure availability of phone/video source, confirm preferred email & phone number, and discuss instructions and expectations.  I reminded Lisa [name] to be prepared with any vital sign and/or heart rhythm information that could potentially be obtained via home monitoring, at the time of her visit. I reminded Lisa [name] to expect a phone call prior to her visit.  Rene Paci McClain 03/09/2019 10:44 AM    FULL LENGTH CONSENT FOR TELE-HEALTH VISIT   I hereby voluntarily request, consent and authorize [facility] and its employed or contracted physicians, physician assistants, nurse practitioners or other licensed health care professionals (the Practitioner), to provide me with telemedicine health care services (the Services") as deemed necessary by the treating Practitioner. I acknowledge and consent to receive the Services by the Practitioner via telemedicine. I understand that the  telemedicine visit will involve communicating with the Practitioner through live audiovisual communication technology and the disclosure of certain medical information by electronic transmission. I acknowledge that I have been given the opportunity to request an in-person assessment or other available alternative prior to the telemedicine visit and am voluntarily participating in the telemedicine visit.  I understand that I have the right to withhold or withdraw my consent to the use of telemedicine in the course of my care at any time, without affecting my right to future care or treatment, and that the Practitioner or I may terminate the telemedicine visit at any time. I understand that I have the right to inspect all information obtained and/or recorded in the course of the telemedicine visit and may receive copies of available information for a reasonable fee.  I understand that some of the potential risks of receiving the Services via telemedicine include:   Delay or interruption in medical evaluation due to technological equipment failure or disruption;  Information transmitted may not be sufficient (e.g. poor resolution of images) to allow for appropriate medical decision making by the Practitioner; and/or   In rare instances, security protocols could fail, causing a breach of personal health information.  Furthermore, I acknowledge that it is my responsibility to provide information about my medical history, conditions and care that is complete and accurate to the best of my ability. I acknowledge that Practitioner's advice, recommendations, and/or decision may be based on factors not within their control, such as incomplete or inaccurate data provided by me or distortions of diagnostic images or specimens that may result from electronic transmissions. I understand that the practice of medicine is not an exact science and that Practitioner makes no warranties or guarantees regarding treatment  outcomes. I acknowledge that I will receive a copy of this consent concurrently upon execution via email to the email address I last provided but may also request a printed copy by calling the office of [place].    I understand that my insurance will be billed for this visit.   I have read or had this consent read to me.  I understand the contents of this consent, which adequately explains the benefits and risks of the Services being provided via telemedicine.   I have been provided ample opportunity to ask questions regarding this consent and the Services and have had my questions answered to my satisfaction.  I give my informed consent for the services to be provided through the use of telemedicine in my medical care  By participating in this telemedicine visit I agree to the above.

## 2019-03-22 ENCOUNTER — Encounter: Payer: Self-pay | Admitting: Cardiovascular Disease

## 2019-03-22 ENCOUNTER — Telehealth (INDEPENDENT_AMBULATORY_CARE_PROVIDER_SITE_OTHER): Payer: Medicare Other | Admitting: Cardiovascular Disease

## 2019-03-22 ENCOUNTER — Other Ambulatory Visit: Payer: Self-pay

## 2019-03-22 VITALS — BP 133/68 | HR 72 | Ht 62.0 in | Wt 180.0 lb

## 2019-03-22 DIAGNOSIS — I5022 Chronic systolic (congestive) heart failure: Secondary | ICD-10-CM

## 2019-03-22 DIAGNOSIS — I480 Paroxysmal atrial fibrillation: Secondary | ICD-10-CM

## 2019-03-22 DIAGNOSIS — I10 Essential (primary) hypertension: Secondary | ICD-10-CM

## 2019-03-22 NOTE — Progress Notes (Signed)
Virtual Visit via Telephone Note   This visit type was conducted due to national recommendations for restrictions regarding the COVID-19 Pandemic (e.g. social distancing) in an effort to limit this patient's exposure and mitigate transmission in our community.  Due to her co-morbid illnesses, this patient is at least at moderate risk for complications without adequate follow up.  This format is felt to be most appropriate for this patient at this time.  The patient did not have access to video technology/had technical difficulties with video requiring transitioning to audio format only (telephone).  All issues noted in this document were discussed and addressed.  No physical exam could be performed with this format.  Please refer to the patient's chart for her  consent to telehealth for [facility].   Date:  03/22/2019   ID:  Lisa [name], DOB 03/16/1927, MRN [id number]  Patient Location: Home Provider Location: Home  PCP:  Kandyce RudBabaoff, Marcus, MD  Cardiologist:  Lorine BearsMuhammad Refugio Vandevoorde, MD  Electrophysiologist:  None   Evaluation Performed:  Follow-Up Visit  Chief Complaint: Doing well with no complaints  History of Present Illness:    Lisa [name] is a 85 y.o. female was reached via phone for a follow-up visit regarding chronic systolic heart failure, LBBB and paroxysmal atrial fibrillation.  She presented in November 2016 with subdural and subarachnoid hemorrhage after a fall. She suffered cardiac arrest at that time due to ventricular tachycardia. Troponin was mildly elevated. Echocardiogram showed an ejection fraction of 15-20% with akinesis of the entire anteroseptal myocardium with moderate pulmonary hypertension. She was suspected of having stress-induced cardiomyopathy and was managed conservatively.  A follow-up echocardiogram in February 2017 showed an ejection fraction of 35-40% with severe anteroseptal hypokinesis. She had atrial fibrillation in the setting of acute illness and has been  on amiodarone and anticoagulation. He has other medical problems including essential hypertension and chronic kidney disease.  She had shingles in 2019.  She has been doing well with no recent chest pain or shortness of breath.  No palpitations or tachycardia.  She takes her medications regularly. The patient does not have symptoms concerning for COVID-19 infection (fever, chills, cough, or new shortness of breath).    Past Medical History:  Diagnosis Date  . Allergy   . Allergy to shellfish    causes swelling  . Brain bleed (HCC)   . Chronic back pain   . Chronic systolic CHF (congestive heart failure) (HCC)   . Depression    unspecified  . Foot fracture    bilateral  . History of abnormal mammogram    Status post breast biopsy x 2, which were negative.   [name]. History of chicken pox   . History of sinus tachycardia 11/1997   With dyspnea on exertion. Workup included a VQ scan which was normal, and ultrasound of her lower extremities, which was normal with no evidence of DVT.  [name]. Hypertension   . Nocturnal leg cramps   . Osteoarthritis   . Osteoporosis   . Pelvis fracture (HCC)   . Peptic ulcer disease   . Renal disorder   . Spinal stenosis   . V-tach (HCC)   . Wrist fracture, right    Past Surgical History:  Procedure Laterality Date  . BACK SURGERY N/A   . BREAST LUMPECTOMY N/A   . FRACTURE SURGERY  08/25/2013   ORIF left distal radius fracture.  . WISDOM TOOTH EXTRACTION       Current Meds  Medication Sig  .  ALLERGY RELIEF 10 MG tablet TAKE ONE TABLET EVERY DAY  . amiodarone (PACERONE) 200 MG tablet TAKE 1/2 TABLET BY M0UTH DAILY  . citalopram (CELEXA) 20 MG tablet Take 20 mg by mouth daily.   [name]. ELIQUIS 2.5 MG TABS tablet TAKE ONE TABLET TWICE DAILY  . erythromycin ophthalmic ointment 1 application as needed.   . furosemide (LASIX) 20 MG tablet TAKE ONE TABLET EVERY DAY  . gabapentin (NEURONTIN) 100 MG capsule Take 1 capsule (100 mg total) by mouth at bedtime.  .  irbesartan (AVAPRO) 150 MG tablet Take 150 mg by mouth daily.   [name]. levothyroxine (SYNTHROID, LEVOTHROID) 50 MCG tablet Take 50 mcg by mouth daily before breakfast.   . metoprolol tartrate (LOPRESSOR) 25 MG tablet Take 12.5 mg by mouth 2 (two) times daily.   . mirtazapine (REMERON) 15 MG tablet Take 15 mg by mouth at bedtime.   . Multiple Vitamins-Minerals (PRESERVISION AREDS 2+MULTI VIT) CAPS Take by mouth.  . primidone (MYSOLINE) 50 MG tablet Take 50 mg by mouth 2 (two) times daily.  . risperiDONE (RISPERDAL) 0.25 MG tablet TAKE ONE TABLET AT BEDTIME  . valACYclovir (VALTREX) 1000 MG tablet Take 1 tablet (1,000 mg total) by mouth daily.     Allergies:   Ambien [zolpidem tartrate], Daypro [oxaprozin], Oxycodone, Shellfish allergy, Ace inhibitors, and Norvasc [amlodipine besylate]   Social History   Tobacco Use  . Smoking status: Never Smoker  . Smokeless tobacco: Never Used  Substance Use Topics  . Alcohol use: Yes    Alcohol/week: 0.0 standard drinks    Comment: occasional wine  . Drug use: No     Family Hx: [name] family history includes COPD in her father and mother; Heart attack in her father; Heart disease in her father; High blood pressure in her father and mother; Lung disease in her mother; Skin cancer in her brother.  ROS:   Please see the history of present illness.     All other systems reviewed and are negative.   Prior CV studies:   The following studies were reviewed today:    Labs/Other Tests and Data Reviewed:    EKG:  No ECG reviewed.  Recent Labs: No results found for requested labs within last 8760 hours.   Recent Lipid Panel Lab Results  Component Value Date/Time   CHOL 174 12/10/2015 05:37 AM   TRIG 115 12/10/2015 05:37 AM   HDL 44 12/10/2015 05:37 AM   CHOLHDL 4.0 12/10/2015 05:37 AM   LDLCALC 107 (H) 12/10/2015 05:37 AM    Wt Readings from Last 3 Encounters:  03/22/19 180 lb (81.6 kg)  08/24/18 186 lb (84.4 kg)  05/18/18 183 lb  (83 kg)     Objective:    Vital Signs:  BP 133/68   Pulse 72   Ht 5\' 2"  (1.575 m)   Wt 180 lb (81.6 kg)   BMI 32.92 kg/m    VITAL SIGNS:  reviewed  ASSESSMENT & PLAN:    1.  Chronic systolic heart failure: Due to stress-induced cardiomyopathy versus ischemic cardiomyopathy. Continue treatment with small dose metoprolol and ARB.  She appears to be euvolemic on current dose of furosemide 20 mg once daily. She is due for routine labs which will be requested including basic metabolic profile especially with underlying chronic kidney disease  2. Paroxysmal atrial fibrillation: She seems to be in sinus rhythm based on symptoms and pulse.  Continue small dose amiodarone and Eliquis.  I will request labs to check liver and  thyroid profile as well as CBC.  3. Essential hypertension: Blood pressure is controlled   COVID-19 Education: The signs and symptoms of COVID-19 were discussed with the patient and how to seek care for testing (follow up with PCP or arrange E-visit).  The importance of social distancing was discussed today.  Time:   Today, I have spent 12 minutes with the patient with telehealth technology discussing the above problems.     Medication Adjustments/Labs and Tests Ordered: Current medicines are reviewed at length with the patient today.  Concerns regarding medicines are outlined above.   Tests Ordered: No orders of the defined types were placed in this encounter.   Medication Changes: No orders of the defined types were placed in this encounter.   Follow Up:  In Person in 4 month(s)  Signed, Kathlyn Sacramento, MD  03/22/2019 2:57 PM    [facility]

## 2019-03-22 NOTE — Patient Instructions (Signed)
Medication Instructions:  Continue same medications If you need a refill on your cardiac medications before your next appointment, please call your pharmacy.   Lab work: CBC, basic metabolic profile, liver profile and TSH If you have labs (blood work) drawn today and your tests are completely normal, you will receive your results only by: [name] MyChart Message (if you have MyChart) OR . A paper copy in the mail If you have any lab test that is abnormal or we need to change your treatment, we will call you to review the results.  Testing/Procedures: None  Follow-Up: At [facility], you and your health needs are our priority.  As part of our continuing mission to provide you with exceptional heart care, we have created designated Provider Care Teams.  These Care Teams include your primary Cardiologist (physician) and Advanced Practice Providers (APPs -  Physician Assistants and Nurse Practitioners) who all work together to provide you with the care you need, when you need it. You will need a follow up appointment in 4 months.  Please call our office 2 months in advance to schedule this appointment.  You may see Kathlyn Sacramento, MD or one of the following Advanced Practice Providers on your designated Care Team:   Murray Hodgkins, NP Christell Faith, PA-C . Marrianne Mood, PA-C

## 2019-03-22 NOTE — Addendum Note (Signed)
Addended by: Lamar Laundry on: 03/22/2019 03:08 PM   Modules accepted: Orders

## 2019-03-23 ENCOUNTER — Telehealth: Payer: Self-pay

## 2019-03-23 NOTE — Telephone Encounter (Signed)
Placed a call to the pt after her 6/30 telehealth appt with Dr.Arida. Pt is aware that Dr. Fletcher Anon would like her to have labwork. Adv her that the labs are non-fasting. [facility]. Pt verbalized understanding and voiced appreciation for the call.

## 2019-03-24 ENCOUNTER — Other Ambulatory Visit
Admission: RE | Admit: 2019-03-24 | Discharge: 2019-03-24 | Disposition: A | Discharge status: Home / Self Care | Payer: Medicare Other | Source: Ambulatory Visit | Attending: Cardiovascular Disease | Admitting: Cardiovascular Disease

## 2019-03-24 DIAGNOSIS — I5022 Chronic systolic (congestive) heart failure: Secondary | ICD-10-CM | POA: Insufficient documentation

## 2019-03-24 DIAGNOSIS — I480 Paroxysmal atrial fibrillation: Secondary | ICD-10-CM | POA: Diagnosis present

## 2019-03-24 DIAGNOSIS — I10 Essential (primary) hypertension: Secondary | ICD-10-CM | POA: Insufficient documentation

## 2019-03-24 LAB — CBC WITH DIFFERENTIAL/PLATELET
Abs Immature Granulocytes: 0.09 10*3/uL — ABNORMAL HIGH (ref 0.00–0.07)
Basophils Absolute: 0.1 10*3/uL (ref 0.0–0.1)
Basophils Relative: 1 %
Eosinophils Absolute: 0.2 10*3/uL (ref 0.0–0.5)
Eosinophils Relative: 3 %
HCT: 36.9 % (ref 36.0–46.0)
Hemoglobin: 11.6 g/dL — ABNORMAL LOW (ref 12.0–15.0)
Immature Granulocytes: 1 %
Lymphocytes Relative: 19 %
Lymphs Abs: 1.5 10*3/uL (ref 0.7–4.0)
MCH: 34.1 pg — ABNORMAL HIGH (ref 26.0–34.0)
MCHC: 31.4 g/dL (ref 30.0–36.0)
MCV: 108.5 fL — ABNORMAL HIGH (ref 80.0–100.0)
Monocytes Absolute: 0.6 10*3/uL (ref 0.1–1.0)
Monocytes Relative: 7 %
Neutro Abs: 5.4 10*3/uL (ref 1.7–7.7)
Neutrophils Relative %: 69 %
Platelets: 201 10*3/uL (ref 150–400)
RBC: 3.4 MIL/uL — ABNORMAL LOW (ref 3.87–5.11)
RDW: 13.7 % (ref 11.5–15.5)
WBC: 7.9 10*3/uL (ref 4.0–10.5)
nRBC: 0 % (ref 0.0–0.2)

## 2019-03-24 LAB — BASIC METABOLIC PANEL
Anion gap: 10 (ref 5–15)
BUN: 28 mg/dL — ABNORMAL HIGH (ref 8–23)
CO2: 25 mmol/L (ref 22–32)
Calcium: 9.4 mg/dL (ref 8.9–10.3)
Chloride: 103 mmol/L (ref 98–111)
Creatinine, Ser: 1.87 mg/dL — ABNORMAL HIGH (ref 0.44–1.00)
GFR calc Af Amer: 27 mL/min — ABNORMAL LOW (ref >60)
GFR calc non Af Amer: 23 mL/min — ABNORMAL LOW (ref >60)
Glucose, Bld: 248 mg/dL — ABNORMAL HIGH (ref 70–99)
Potassium: 5 mmol/L (ref 3.5–5.1)
Sodium: 138 mmol/L (ref 135–145)

## 2019-03-24 LAB — HEPATIC FUNCTION PANEL
ALT: 35 U/L (ref 0–44)
AST: 55 U/L — ABNORMAL HIGH (ref 15–41)
Albumin: 4 g/dL (ref 3.5–5.0)
Alkaline Phosphatase: 76 U/L (ref 38–126)
Bilirubin, Direct: <0.1 mg/dL (ref 0.0–0.2)
Total Bilirubin: 0.5 mg/dL (ref 0.3–1.2)
Total Protein: 7.7 g/dL (ref 6.5–8.1)

## 2019-03-24 LAB — TSH: TSH: 8.789 u[IU]/mL — ABNORMAL HIGH (ref 0.350–4.500)

## 2019-03-24 NOTE — Addendum Note (Signed)
Addended by: Dilia Alemany on: 03/24/2019 10:41 AM   Modules accepted: Orders  

## 2019-03-24 NOTE — Addendum Note (Signed)
Addended by: Elizabethanne Lusher on: 03/24/2019 10:41 AM   Modules accepted: Orders  

## 2019-03-24 NOTE — Addendum Note (Signed)
Addended by: Beyounce Dickens on: 03/24/2019 10:41 AM   Modules accepted: Orders  

## 2019-03-24 NOTE — Addendum Note (Signed)
Addended by: Santiago Bur on: 03/24/2019 10:41 AM   Modules accepted: Orders

## 2019-04-13 ENCOUNTER — Encounter: Payer: Self-pay | Admitting: Physical Medicine & Rehabilitation

## 2019-04-13 ENCOUNTER — Encounter: Payer: Medicare Other | Attending: Physical Medicine & Rehabilitation | Admitting: Physical Medicine & Rehabilitation

## 2019-04-13 ENCOUNTER — Other Ambulatory Visit: Payer: Self-pay

## 2019-04-13 VITALS — BP 153/83 | HR 77 | Temp 97.8°F | Ht 62.0 in | Wt 190.0 lb

## 2019-04-13 DIAGNOSIS — R251 Tremor, unspecified: Secondary | ICD-10-CM | POA: Diagnosis present

## 2019-04-13 DIAGNOSIS — M47816 Spondylosis without myelopathy or radiculopathy, lumbar region: Secondary | ICD-10-CM | POA: Diagnosis present

## 2019-04-13 NOTE — Progress Notes (Signed)
Subjective:    Patient ID: Lisa [name], female    DOB: [birth year], 85 y.o.   MRN: [id number]  HPI  Lisa [name] is here in follow up of her gait disorder and TBI, pain. She has been sequester at home like many others since March. She passes time walking at home, pedal machine, reading, watching TV. Her pain levels are well controlled. She has no new skin issues. Her back pain is ongoing but is manageable. She tells me that she can certainly can "live with it". She is on gabapentin at night.   Bowels and bladder are under control. Her tremors have been minimal. She remains on primidone   Pain Inventory Average Pain 5 Pain Right Now 1 My pain is intermittent and sharp  In the last 24 hours, has pain interfered with the following? General activity 0 Relation with others 0 Enjoyment of life 0 What TIME of day is your pain at its worst? daytime Sleep (in general) Good  Pain is worse with: standing Pain improves with: rest Relief from Meds: .  Mobility walk with assistance use a walker ability to climb steps?  yes do you drive?  no  Function not employed: date last employed . I need assistance with the following:  shopping  Neuro/Psych tremor loss of taste or smell  Prior Studies Any changes since last visit?  no  Physicians involved in your care Any changes since last visit?  no   Family History  Problem Relation Age of Onset  . COPD Mother   . High blood pressure Mother   . Lung disease Mother   . COPD Father   . Heart disease Father   . High blood pressure Father   . Heart attack Father   . Skin cancer Brother    Social History   Socioeconomic History  . Marital status: Widowed    Spouse name: Not on file  . Number of children: 1  . Years of education: 42  . Highest education level: Not on file  Occupational History  . Occupation: retired  Scientific laboratory technician  . Financial resource strain: Not on file  . Food insecurity    Worry: Not on file    Inability:  Not on file  . Transportation needs    Medical: Not on file    Non-medical: Not on file  Tobacco Use  . Smoking status: Never Smoker  . Smokeless tobacco: Never Used  Substance and Sexual Activity  . Alcohol use: Yes    Alcohol/week: 0.0 standard drinks    Comment: occasional wine  . Drug use: No  . Sexual activity: Not on file  Lifestyle  . Physical activity    Days per week: Not on file    Minutes per session: Not on file  . Stress: Not on file  Relationships  . Social Herbalist on phone: Not on file    Gets together: Not on file    Attends religious service: Not on file    Active member of club or organization: Not on file    Attends meetings of clubs or organizations: Not on file    Relationship status: Not on file  Other Topics Concern  . Not on file  Social History Narrative   Admitted to [facility] 05/15/17 - 06/19/17   Widowed   Never smoker   Occasionally drinks wine   DNR   Past Surgical History:  Procedure Laterality Date  . BACK SURGERY N/A   .  BREAST LUMPECTOMY N/A   . FRACTURE SURGERY  08/25/2013   ORIF left distal radius fracture.  . WISDOM TOOTH EXTRACTION     Past Medical History:  Diagnosis Date  . Allergy   . Allergy to shellfish    causes swelling  . Brain bleed (HCC)   . Chronic back pain   . Chronic systolic CHF (congestive heart failure) (HCC)   . Depression    unspecified  . Foot fracture    bilateral  . History of abnormal mammogram    Status post breast biopsy x 2, which were negative.   [name]. History of chicken pox   . History of sinus tachycardia 11/1997   With dyspnea on exertion. Workup included a VQ scan which was normal, and ultrasound of her lower extremities, which was normal with no evidence of DVT.  [name]. Hypertension   . Nocturnal leg cramps   . Osteoarthritis   . Osteoporosis   . Pelvis fracture (HCC)   . Peptic ulcer disease   . Renal disorder   . Spinal stenosis   . V-tach (HCC)   . Wrist fracture,  right    BP (!) 153/83   Pulse 77   Temp 97.8 F (36.6 C)   Ht 5\' 2"  (1.575 m)   Wt 190 lb (86.2 kg)   SpO2 95%   BMI 34.75 kg/m   Opioid Risk Score:   Fall Risk Score:  `1  Depression screen PHQ 2/9  Dep[facility] screen [facility]9 08/24/2018 08/24/2017 11/14/2016 10/22/2015 07/24/2015 07/16/2015 06/26/2015  Decreased Interest 0 0 1 0 0 0 0  Down, Depressed, Hopeless 0 0 1 0 0 0 0  PHQ - 2 Score 0 0 2 0 0 0 0  Altered sleeping - - 2 0 - - -  Tired, decreased energy - - 1 0 - - -  Change in appetite - - 0 0 - - -  Feeling bad or failure about yourself  - - 0 0 - - -  Trouble concentrating - - 0 0 - - -  Moving slowly or fidgety/restless - - 0 0 - - -  Suicidal thoughts - - 0 0 - - -  PHQ-9 Score - - 5 0 - - -  Some recent data might be hidden    Review of Systems  Constitutional: Positive for unexpected weight change.  HENT: Negative.   Eyes: Negative.   Respiratory: Positive for wheezing.   Cardiovascular: Negative.   Gastrointestinal: Positive for diarrhea.  Endocrine: Negative.   Genitourinary: Negative.   Musculoskeletal: Negative.   Skin: Negative.   Allergic/Immunologic: Negative.   Neurological: Positive for tremors.  Hematological: Negative.   Psychiatric/Behavioral: Negative.   All other systems reviewed and are negative.      Objective:   Physical Exam General: No acute distress HEENT: EOMI, oral membranes moist Cards: reg rate  Chest: normal effort Abdomen: Soft, NT, ND Skin: dry, intact Extremities: no edema Musculoskeletal:LB TTP Neurological: improved insight and awareness. Memory is functional. Strength is 4 to 4+ out of 5 bilaterally upper and lower extremities.  sensory exam intact. Balance intact with walker. Transfers easily sit to stand.  Skin:appears intact Psych:Patient is pleasant and appropriate   Assessment & Plan:  Medical Problem List and Plan: 1. 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  -continue with RW  -Supervision at home 2. Left heel ulcer.  -healed  3. Chronic back pain/Pain Management: lumbar post  lami syndrome. [facility] Metta Clines[facility].  - respondedto the L3-4 MBB's however.  -reiterated need to continue with HEP -still can consider ESI in the future if pain recurs or worsens--pain is sitll well controlled at present 4.Tremor:controlled on primidone 5. Insomnia, ?mild sundowning?               -continuelow dose risperdal for sleep/mood, 0.25mg  qhs #30. 6. Right shoulder pain/RTC tendonitis---maintain HEP 7. Depression: celexa 8. 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               -continue LOW dose gabapentin at night 100mg - .               -tylenol as needed  She's doing quite well!. Fifteen minutes of face to face patient care time were spent during this visit. All questions were encouraged and answered.  Follow up with me in 4 months .

## 2019-04-13 NOTE — Patient Instructions (Signed)
PLEASE FEEL FREE TO CALL OUR OFFICE WITH ANY PROBLEMS OR QUESTIONS ([phone])      

## 2019-05-18 ENCOUNTER — Other Ambulatory Visit: Payer: Self-pay | Admitting: Cardiovascular Disease

## 2019-05-18 ENCOUNTER — Other Ambulatory Visit: Payer: Self-pay | Admitting: Physical Medicine & Rehabilitation

## 2019-05-18 DIAGNOSIS — F409 Phobic anxiety disorder, unspecified: Secondary | ICD-10-CM

## 2019-05-18 DIAGNOSIS — S5401XS Injury of ulnar nerve at forearm level, right arm, sequela: Secondary | ICD-10-CM

## 2019-05-18 DIAGNOSIS — F5105 Insomnia due to other mental disorder: Secondary | ICD-10-CM

## 2019-05-18 NOTE — Telephone Encounter (Signed)
Please review Eliquis refill. Thanks!  

## 2019-05-18 NOTE — Telephone Encounter (Signed)
Pt's age 85, wt 86.2 kg, SCr 1.87, CrCl 27.21, last ov w/ MA 03/22/19.

## 2019-08-17 ENCOUNTER — Emergency Department (HOSPITAL_COMMUNITY): Payer: Medicare Other

## 2019-08-17 ENCOUNTER — Inpatient Hospital Stay (HOSPITAL_COMMUNITY)
Admission: EM | Admit: 2019-08-17 | Discharge: 2019-08-23 | DRG: 083 | Disposition: E | Payer: Medicare Other | Attending: Internal Medicine | Admitting: Internal Medicine

## 2019-08-17 DIAGNOSIS — Z885 Allergy status to narcotic agent status: Secondary | ICD-10-CM

## 2019-08-17 DIAGNOSIS — S065X9A Traumatic subdural hemorrhage with loss of consciousness of unspecified duration, initial encounter: Secondary | ICD-10-CM | POA: Diagnosis not present

## 2019-08-17 DIAGNOSIS — Z8249 Family history of ischemic heart disease and other diseases of the circulatory system: Secondary | ICD-10-CM

## 2019-08-17 DIAGNOSIS — I110 Hypertensive heart disease with heart failure: Secondary | ICD-10-CM | POA: Diagnosis present

## 2019-08-17 DIAGNOSIS — R4182 Altered mental status, unspecified: Secondary | ICD-10-CM | POA: Diagnosis not present

## 2019-08-17 DIAGNOSIS — J9600 Acute respiratory failure, unspecified whether with hypoxia or hypercapnia: Secondary | ICD-10-CM

## 2019-08-17 DIAGNOSIS — G8929 Other chronic pain: Secondary | ICD-10-CM | POA: Diagnosis present

## 2019-08-17 DIAGNOSIS — Z91013 Allergy to seafood: Secondary | ICD-10-CM

## 2019-08-17 DIAGNOSIS — Z515 Encounter for palliative care: Secondary | ICD-10-CM

## 2019-08-17 DIAGNOSIS — Z825 Family history of asthma and other chronic lower respiratory diseases: Secondary | ICD-10-CM

## 2019-08-17 DIAGNOSIS — Y92009 Unspecified place in unspecified non-institutional (private) residence as the place of occurrence of the external cause: Secondary | ICD-10-CM

## 2019-08-17 DIAGNOSIS — M549 Dorsalgia, unspecified: Secondary | ICD-10-CM | POA: Diagnosis present

## 2019-08-17 DIAGNOSIS — S065XAA Traumatic subdural hemorrhage with loss of consciousness status unknown, initial encounter: Secondary | ICD-10-CM | POA: Diagnosis present

## 2019-08-17 DIAGNOSIS — I5022 Chronic systolic (congestive) heart failure: Secondary | ICD-10-CM | POA: Diagnosis present

## 2019-08-17 DIAGNOSIS — I4891 Unspecified atrial fibrillation: Secondary | ICD-10-CM | POA: Diagnosis present

## 2019-08-17 DIAGNOSIS — Z66 Do not resuscitate: Secondary | ICD-10-CM | POA: Diagnosis present

## 2019-08-17 DIAGNOSIS — I420 Dilated cardiomyopathy: Secondary | ICD-10-CM | POA: Diagnosis present

## 2019-08-17 DIAGNOSIS — M810 Age-related osteoporosis without current pathological fracture: Secondary | ICD-10-CM | POA: Diagnosis present

## 2019-08-17 DIAGNOSIS — W19XXXA Unspecified fall, initial encounter: Secondary | ICD-10-CM | POA: Diagnosis present

## 2019-08-17 DIAGNOSIS — Z888 Allergy status to other drugs, medicaments and biological substances status: Secondary | ICD-10-CM

## 2019-08-17 DIAGNOSIS — Z808 Family history of malignant neoplasm of other organs or systems: Secondary | ICD-10-CM

## 2019-08-17 DIAGNOSIS — M1990 Unspecified osteoarthritis, unspecified site: Secondary | ICD-10-CM | POA: Diagnosis present

## 2019-08-17 DIAGNOSIS — Z20828 Contact with and (suspected) exposure to other viral communicable diseases: Secondary | ICD-10-CM | POA: Diagnosis present

## 2019-08-17 LAB — CBC
HCT: 38.8 % (ref 36.0–46.0)
Hemoglobin: 12.9 g/dL (ref 12.0–15.0)
MCH: 34.8 pg — ABNORMAL HIGH (ref 26.0–34.0)
MCHC: 33.2 g/dL (ref 30.0–36.0)
MCV: 104.6 fL — ABNORMAL HIGH (ref 80.0–100.0)
Platelets: 230 10*3/uL (ref 150–400)
RBC: 3.71 MIL/uL — ABNORMAL LOW (ref 3.87–5.11)
RDW: 12.8 % (ref 11.5–15.5)
WBC: 12.3 10*3/uL — ABNORMAL HIGH (ref 4.0–10.5)
nRBC: 0 % (ref 0.0–0.2)

## 2019-08-17 LAB — I-STAT CHEM 8, ED
BUN: 31 mg/dL — ABNORMAL HIGH (ref 8–23)
Calcium, Ion: 1.09 mmol/L — ABNORMAL LOW (ref 1.15–1.40)
Chloride: 96 mmol/L — ABNORMAL LOW (ref 98–111)
Creatinine, Ser: 2 mg/dL — ABNORMAL HIGH (ref 0.44–1.00)
Glucose, Bld: 612 mg/dL (ref 70–99)
HCT: 42 % (ref 36.0–46.0)
Hemoglobin: 14.3 g/dL (ref 12.0–15.0)
Potassium: 4.5 mmol/L (ref 3.5–5.1)
Sodium: 132 mmol/L — ABNORMAL LOW (ref 135–145)
TCO2: 23 mmol/L (ref 22–32)

## 2019-08-17 LAB — COMPREHENSIVE METABOLIC PANEL
ALT: 30 U/L (ref 0–44)
AST: 50 U/L — ABNORMAL HIGH (ref 15–41)
Albumin: 3.9 g/dL (ref 3.5–5.0)
Alkaline Phosphatase: 95 U/L (ref 38–126)
Anion gap: 17 — ABNORMAL HIGH (ref 5–15)
BUN: 28 mg/dL — ABNORMAL HIGH (ref 8–23)
CO2: 22 mmol/L (ref 22–32)
Calcium: 9.6 mg/dL (ref 8.9–10.3)
Chloride: 93 mmol/L — ABNORMAL LOW (ref 98–111)
Creatinine, Ser: 2.28 mg/dL — ABNORMAL HIGH (ref 0.44–1.00)
GFR calc Af Amer: 21 mL/min — ABNORMAL LOW (ref >60)
GFR calc non Af Amer: 18 mL/min — ABNORMAL LOW (ref >60)
Glucose, Bld: 605 mg/dL (ref 70–99)
Potassium: 4.4 mmol/L (ref 3.5–5.1)
Sodium: 132 mmol/L — ABNORMAL LOW (ref 135–145)
Total Bilirubin: 0.6 mg/dL (ref 0.3–1.2)
Total Protein: 8 g/dL (ref 6.5–8.1)

## 2019-08-17 LAB — DIFFERENTIAL
Abs Immature Granulocytes: 0.11 10*3/uL — ABNORMAL HIGH (ref 0.00–0.07)
Basophils Absolute: 0.1 10*3/uL (ref 0.0–0.1)
Basophils Relative: 1 %
Eosinophils Absolute: 0.3 10*3/uL (ref 0.0–0.5)
Eosinophils Relative: 2 %
Immature Granulocytes: 1 %
Lymphocytes Relative: 36 %
Lymphs Abs: 4.4 10*3/uL — ABNORMAL HIGH (ref 0.7–4.0)
Monocytes Absolute: 0.9 10*3/uL (ref 0.1–1.0)
Monocytes Relative: 7 %
Neutro Abs: 6.5 10*3/uL (ref 1.7–7.7)
Neutrophils Relative %: 53 %

## 2019-08-17 LAB — APTT: aPTT: 30 seconds (ref 24–36)

## 2019-08-17 LAB — PROTIME-INR
INR: 1.3 — ABNORMAL HIGH (ref 0.8–1.2)
Prothrombin Time: 15.7 seconds — ABNORMAL HIGH (ref 11.4–15.2)

## 2019-08-17 LAB — SARS CORONAVIRUS 2 BY RT PCR (HOSPITAL ORDER, PERFORMED IN ~~LOC~~ HOSPITAL LAB): SARS Coronavirus 2: NEGATIVE

## 2019-08-17 LAB — CBG MONITORING, ED: Glucose-Capillary: 588 mg/dL (ref 70–99)

## 2019-08-17 MED ORDER — LORAZEPAM 1 MG PO TABS: 1.0000 mg | ORAL_TABLET | ORAL | Status: DC | PRN

## 2019-08-17 MED ORDER — LORAZEPAM 2 MG/ML PO CONC: 1.0000 mg | ORAL | Status: DC | PRN

## 2019-08-17 MED ORDER — ACETAMINOPHEN 650 MG RE SUPP: 650.0000 mg | Freq: Four times a day (QID) | RECTAL | Status: DC | PRN

## 2019-08-17 MED ORDER — PROPOFOL 10 MG/ML IV BOLUS
INTRAVENOUS | Status: AC | PRN
  Administered 2019-08-17: 50 mg via INTRAVENOUS

## 2019-08-17 MED ORDER — HALOPERIDOL LACTATE 2 MG/ML PO CONC
0.5000 mg | ORAL | Status: DC | PRN
  Filled 2019-08-17: qty 0.3

## 2019-08-17 MED ORDER — ONDANSETRON 4 MG PO TBDP: 4.0000 mg | ORAL_TABLET | Freq: Four times a day (QID) | ORAL | Status: DC | PRN

## 2019-08-17 MED ORDER — HALOPERIDOL LACTATE 5 MG/ML IJ SOLN: 0.5000 mg | INTRAMUSCULAR | Status: DC | PRN

## 2019-08-17 MED ORDER — PROTHROMBIN COMPLEX CONC HUMAN 500 UNITS IV KIT: 50.0000 [IU]/kg | PACK | Status: DC

## 2019-08-17 MED ORDER — SODIUM CHLORIDE 0.9% FLUSH: 3.0000 mL | Freq: Once | INTRAVENOUS | Status: DC

## 2019-08-17 MED ORDER — GLYCOPYRROLATE 0.2 MG/ML IJ SOLN: 0.2000 mg | INTRAMUSCULAR | Status: DC | PRN

## 2019-08-17 MED ORDER — HYDROMORPHONE HCL 1 MG/ML IJ SOLN: 0.5000 mg | INTRAMUSCULAR | Status: DC | PRN

## 2019-08-17 MED ORDER — MORPHINE 100MG IN NS 100ML (1MG/ML) PREMIX INFUSION
5.0000 mg/h | INTRAVENOUS | Status: DC
  Administered 2019-08-17 – 2019-08-18 (×2): 5 mg/h via INTRAVENOUS
  Filled 2019-08-17 (×2): qty 100

## 2019-08-17 MED ORDER — SUCCINYLCHOLINE CHLORIDE 20 MG/ML IJ SOLN
INTRAMUSCULAR | Status: AC | PRN
  Administered 2019-08-17: 80 mg via INTRAVENOUS

## 2019-08-17 MED ORDER — PROPOFOL 10 MG/ML IV BOLUS
INTRAVENOUS | Status: AC
  Filled 2019-08-17: qty 20

## 2019-08-17 MED ORDER — BIOTENE DRY MOUTH MT LIQD: 15.0000 mL | OROMUCOSAL | Status: DC | PRN

## 2019-08-17 MED ORDER — PROPOFOL 1000 MG/100ML IV EMUL
INTRAVENOUS | Status: AC
  Administered 2019-08-17: 30 ug/kg/min
  Filled 2019-08-17: qty 100

## 2019-08-17 MED ORDER — LORAZEPAM 2 MG/ML IJ SOLN: 1.0000 mg | INTRAMUSCULAR | Status: DC | PRN

## 2019-08-17 MED ORDER — ACETAMINOPHEN 325 MG PO TABS: 650.0000 mg | ORAL_TABLET | Freq: Four times a day (QID) | ORAL | Status: DC | PRN

## 2019-08-17 MED ORDER — BISACODYL 10 MG RE SUPP: 10.0000 mg | Freq: Every day | RECTAL | Status: DC | PRN

## 2019-08-17 MED ORDER — GLYCOPYRROLATE 1 MG PO TABS
1.0000 mg | ORAL_TABLET | ORAL | Status: DC | PRN
  Filled 2019-08-17: qty 1

## 2019-08-17 MED ORDER — GLYCOPYRROLATE 0.2 MG/ML IJ SOLN
0.2000 mg | INTRAMUSCULAR | Status: DC | PRN
  Administered 2019-08-18 (×2): 0.2 mg via INTRAVENOUS
  Filled 2019-08-17 (×2): qty 1

## 2019-08-17 MED ORDER — HALOPERIDOL 0.5 MG PO TABS
0.5000 mg | ORAL_TABLET | ORAL | Status: DC | PRN
  Filled 2019-08-17: qty 1

## 2019-08-17 MED ORDER — POLYVINYL ALCOHOL 1.4 % OP SOLN
1.0000 [drp] | Freq: Four times a day (QID) | OPHTHALMIC | Status: DC | PRN
  Filled 2019-08-17: qty 15

## 2019-08-17 MED ORDER — ONDANSETRON HCL 4 MG/2ML IJ SOLN: 4.0000 mg | Freq: Four times a day (QID) | INTRAMUSCULAR | Status: DC | PRN

## 2019-08-17 MED ORDER — SODIUM CHLORIDE 0.9 % IV SOLN
12.5000 mg | Freq: Four times a day (QID) | INTRAVENOUS | Status: DC | PRN
  Filled 2019-08-17: qty 0.5

## 2019-08-17 MED ORDER — LORAZEPAM 2 MG/ML IJ SOLN
INTRAMUSCULAR | Status: AC
  Filled 2019-08-17: qty 1

## 2019-08-17 MED ORDER — LORAZEPAM 2 MG/ML IJ SOLN
2.0000 mg | Freq: Once | INTRAMUSCULAR | Status: AC
  Administered 2019-08-17: 2 mg via INTRAVENOUS

## 2019-08-17 NOTE — ED Notes (Signed)
Dr. Laverta Baltimore at bedside to extubate pt and place on comfort care

## 2019-08-17 NOTE — ED Provider Notes (Addendum)
Emergency Department Provider Note   I have reviewed the triage vital signs and the nursing notes.   HISTORY  Chief Complaint Code Stroke   HPI Lisa [name] is a 85 y.o. female with past medical history reviewed below including prior intracranial hemorrhage, currently on Eliquis with last dose thought to be 8 AM, presents to the emergency department with deteriorating mental status after a fall.  EMS was called after family reported that the patient had fallen and was having some bleeding.  EMS report that she was awake, alert, and able to ambulate to the front door where they put her on the stretcher.  They began to notice speech change and patient became less alert and eventually only responsive to pain.  [facility]. Patient described as FULL CODE by EMS on arrival.   Level 5 caveat: Unresponsive  Past Medical History:  Diagnosis Date  . Allergy   . Allergy to shellfish    causes swelling  . Brain bleed (HCC)   . Chronic back pain   . Chronic systolic CHF (congestive heart failure) (HCC)   . Depression    unspecified  . Foot fracture    bilateral  . History of abnormal mammogram    Status post breast biopsy x 2, which were negative.   [name]. History of chicken pox   . History of sinus tachycardia 11/1997   With dyspnea on exertion. Workup included a VQ scan which was normal, and ultrasound of her lower extremities, which was normal with no evidence of DVT.  [name]. Hypertension   . Nocturnal leg cramps   . Osteoarthritis   . Osteoporosis   . Pelvis fracture (HCC)   . Peptic ulcer disease   . Renal disorder   . Spinal stenosis   . V-tach (HCC)   . Wrist fracture, right     Patient Active Problem List   Diagnosis Date Noted  . Subdural hematoma, acute (HCC) 07/24/2019  . Comfort measures only status 08/11/2019  . Shingles 01/15/2018  . Chronic cough 07/06/2017  . Tremor, unspecified 06/23/2017  . Generalized weakness  06/06/2017  . Protein-calorie malnutrition (HCC) 05/25/2017  . Vitamin D deficiency 05/25/2017  . Urinary tract infection 05/13/2017  . Spondylosis without myelopathy or radiculopathy, lumbar region 05/05/2017  . Right shoulder pain 09/23/2016  . New onset atrial fibrillation (HCC) 12/11/2015  . Altered mental state   . Congestive dilated cardiomyopathy (HCC)   . Dementia (HCC)   . Headache 12/10/2015  . TIA (transient ischemic attack) 12/10/2015  . Influenza A 11/21/2015  . Ventricular tachycardia (HCC) 11/09/2015  . Constipation 09/12/2015  . Insomnia due to anxiety and fear 09/12/2015  . Depression, controlled 09/12/2015  . Bacterial UTI 08/27/2015  . DVT, lower extremity (HCC) 08/27/2015  . TBI (traumatic brain injury) (HCC) 08/22/2015  . ICH (intracerebral hemorrhage) (HCC)   . Abscess   . Hypomagnesemia   . SDH (subdural hematoma) (HCC)   . Boil of upper extremity   . Pneumonia due to Haemophilus influenzae (HCC)   . Chronic systolic CHF (congestive heart failure) (HCC)   . Acute on chronic renal failure (HCC)   . Hypokalemia   . Bright red blood per rectum   . Dysphagia   . Decubitus ulcer of left heel, stage 2 (HCC) 08/16/2015  . Acute respiratory failure with hypoxemia (HCC)   . Hypernatremia   . Subdural hematoma (HCC) 08/03/2015  . Lumbar radiculopathy 06/06/2015  . Spinal stenosis, lumbar  region, with neurogenic claudication 04/02/2015  . Degenerative lumbar disc 02/15/2015  . Lumbar post-laminectomy syndrome 02/15/2015  . Facet syndrome, lumbar 02/15/2015  . Sacroiliac joint dysfunction 02/15/2015    Past Surgical History:  Procedure Laterality Date  . BACK SURGERY N/A   . BREAST LUMPECTOMY N/A   . FRACTURE SURGERY  08/25/2013   ORIF left distal radius fracture.  . WISDOM TOOTH EXTRACTION      Allergies Ambien [zolpidem tartrate], Daypro [oxaprozin], Oxycodone, Shellfish allergy, Ace inhibitors, and Norvasc [amlodipine besylate]  Family History   Problem Relation Age of Onset  . COPD Mother   . High blood pressure Mother   . Lung disease Mother   . COPD Father   . Heart disease Father   . High blood pressure Father   . Heart attack Father   . Skin cancer Brother     Social History Social History   Tobacco Use  . Smoking status: Never Smoker  . Smokeless tobacco: Never Used  Substance Use Topics  . Alcohol use: Yes    Alcohol/week: 0.0 standard drinks    Comment: occasional wine  . Drug use: No    Review of Systems  Level 5 caveat: Unresponsive ____________________________________________   PHYSICAL EXAM:  VITAL SIGNS: Vitals:   07/29/2019 1802 07/30/2019 1815  BP: (!) 185/74 (!) 175/72  Pulse: 100 90  Resp: 12 19  SpO2:  100%    Constitutional: Patient with posturing on arrival with agonal respirations.  Eyes: Conjunctivae are normal. Pupil on the left slightly larger than right but remains responsive.  Head: Blood and small hematoma to the left occiput.  Nose: No congestion/rhinnorhea. Mouth/Throat: Mucous membranes are moist.   Neck: No stridor.  Cardiovascular: Tachycardia. Good peripheral circulation. Grossly normal heart sounds.   Respiratory: Normal respiratory effort.  No retractions. Lungs CTAB. Gastrointestinal: No distention.  Musculoskeletal: No gross deformities of extremities. Neurologic: Unresponsive with decorticate posturing at time.  Skin: Left occipital hematoma with bleeding. No clear laceration on initial exam.    ____________________________________________   LABS (all labs ordered are listed, but only abnormal results are displayed)  Labs Reviewed  PROTIME-INR - Abnormal; Notable for the following components:      Result Value   Prothrombin Time 15.7 (*)    INR 1.3 (*)    All other components within normal limits  CBC - Abnormal; Notable for the following components:   WBC 12.3 (*)    RBC 3.71 (*)    MCV 104.6 (*)    MCH 34.8 (*)    All other components within normal  limits  DIFFERENTIAL - Abnormal; Notable for the following components:   Lymphs Abs 4.4 (*)    Abs Immature Granulocytes 0.11 (*)    All other components within normal limits  COMPREHENSIVE METABOLIC PANEL - Abnormal; Notable for the following components:   Sodium 132 (*)    Chloride 93 (*)    Glucose, Bld 605 (*)    BUN 28 (*)    Creatinine, Ser 2.28 (*)    AST 50 (*)    GFR calc non Af Amer 18 (*)    GFR calc Af Amer 21 (*)    Anion gap 17 (*)    All other components within normal limits  I-STAT CHEM 8, ED - Abnormal; Notable for the following components:   Sodium 132 (*)    Chloride 96 (*)    BUN 31 (*)    Creatinine, Ser 2.00 (*)    Glucose, Bld  612 (*)    Calcium, Ion 1.09 (*)    All other components within normal limits  CBG MONITORING, ED - Abnormal; Notable for the following components:   Glucose-Capillary 588 (*)    All other components within normal limits  SARS CORONAVIRUS 2 BY RT PCR (HOSPITAL ORDER, PERFORMED IN [facility])  APTT   ____________________________________________  EKG   EKG Interpretation  Date/Time:  Wednesday 09-05-19 16:35:10 EST Ventricular Rate:  114 PR Interval:    QRS Duration: 154 QT Interval:  360 QTC Calculation: 496 R Axis:   -165 Text Interpretation: Sinus tachycardia Nonspecific intraventricular conduction delay  No STEMI Confirmed by Alona Bene [phone]) on 2019/09/05 4:49:29 PM       ____________________________________________  RADIOLOGY  Ct Cervical Spine Wo Contrast  Result Date: 09/05/2019 CLINICAL DATA:  Code stroke.  Fall EXAM: CT HEAD WITHOUT CONTRAST CT CERVICAL SPINE WITHOUT CONTRAST TECHNIQUE: Multidetector CT imaging of the head and cervical spine was performed following the standard protocol without intravenous contrast. Multiplanar CT image reconstructions of the cervical spine were also generated. COMPARISON:  2017 FINDINGS: CT HEAD FINDINGS Brain: There is a large mixed density but  primarily hyperattenuating subdural hematoma along the right cerebral convexity. This measures up to 3.5 cm in maximal thickness along the frontal lobe. Small volume subdural blood is also present along the falx. Subfalcine herniation is present with leftward midline shift measuring 1.2 cm. There is likely early trapping of the left lateral ventricle. Partial effacement of the suprasellar and perimesencephalic cisterns. Chronic bifrontal encephalomalacia anteroinferiorly. There is new hypoattenuation with loss of gray-white differentiation in the right temporal lobe. Vascular: There is intracranial atherosclerotic calcification at the skull base. Skull: Calvarium is intact. Sinuses/Orbits: Aerated.  No acute orbital abnormality. Other: Mastoid air cells are clear. Left occipital scalp soft tissue swelling and laceration. CT CERVICAL SPINE FINDINGS Alignment: Nonspecific straightening of the cervical lordosis. Trace anterolisthesis at C7-T1. Skull base and vertebrae: Vertebral body heights are maintained apart from degenerative endplate irregularity. There is no acute fracture. Soft tissues and spinal canal: Spinal canal is poorly evaluated due to artifact. Disc levels: Advanced multilevel degenerative changes including disc height loss, endplate osteophytes, and facet and uncovertebral joint hypertrophy. Upper chest: Scarring at the lung apices. Other: None. IMPRESSION: Large acute right hemispheric subdural hematoma with subfalcine herniation and partial effacement of the basal cisterns. Probable early trapping of the left lateral ventricle. New right temporal hypoattenuation likely reflects subacute or chronic infarct. Chronic bifrontal encephalomalacia. No acute cervical spine fracture. Electronically Signed   By: Guadlupe Spanish M.D.   On: 09-05-2019 17:23   Dg Chest Portable 1 View  Result Date: Sep 05, 2019 CLINICAL DATA:  Intubation. EXAM: PORTABLE CHEST 1 VIEW COMPARISON:  Chest x-ray 01/15/2018. FINDINGS:  Endotracheal tube tip 1.8 cm above the lower portion of the carina. Proximal repositioning of approximately 2 cm should be considered. Low lung volumes with bibasilar atelectasis. Mild left base infiltrate cannot be excluded. Small left pleural effusion cannot be excluded. Biapical pleural thickening noted consistent scarring. No pneumothorax. Cardiomegaly. No pulmonary venous congestion. No acute bony abnormality. IMPRESSION: 1. Endotracheal tube tip 1.8 cm above the lower portion of the carina. Proximal repositioning of approximately 2 cm should be considered. 2. Low lung volumes with bibasilar atelectasis. Mild left base infiltrate cannot be excluded. Small left pleural effusion cannot be excluded. Electronically Signed   By: Maisie Fus  Register   On: September 05, 2019 17:31   Ct Head Code Stroke Wo Contrast  Result Date: 09/09/19 CLINICAL DATA:  Code stroke.  Fall EXAM: CT HEAD WITHOUT CONTRAST CT CERVICAL SPINE WITHOUT CONTRAST TECHNIQUE: Multidetector CT imaging of the head and cervical spine was performed following the standard protocol without intravenous contrast. Multiplanar CT image reconstructions of the cervical spine were also generated. COMPARISON:  2017 FINDINGS: CT HEAD FINDINGS Brain: There is a large mixed density but primarily hyperattenuating subdural hematoma along the right cerebral convexity. This measures up to 3.5 cm in maximal thickness along the frontal lobe. Small volume subdural blood is also present along the falx. Subfalcine herniation is present with leftward midline shift measuring 1.2 cm. There is likely early trapping of the left lateral ventricle. Partial effacement of the suprasellar and perimesencephalic cisterns. Chronic bifrontal encephalomalacia anteroinferiorly. There is new hypoattenuation with loss of gray-white differentiation in the right temporal lobe. Vascular: There is intracranial atherosclerotic calcification at the skull base. Skull: Calvarium is intact.  Sinuses/Orbits: Aerated.  No acute orbital abnormality. Other: Mastoid air cells are clear. Left occipital scalp soft tissue swelling and laceration. CT CERVICAL SPINE FINDINGS Alignment: Nonspecific straightening of the cervical lordosis. Trace anterolisthesis at C7-T1. Skull base and vertebrae: Vertebral body heights are maintained apart from degenerative endplate irregularity. There is no acute fracture. Soft tissues and spinal canal: Spinal canal is poorly evaluated due to artifact. Disc levels: Advanced multilevel degenerative changes including disc height loss, endplate osteophytes, and facet and uncovertebral joint hypertrophy. Upper chest: Scarring at the lung apices. Other: None. IMPRESSION: Large acute right hemispheric subdural hematoma with subfalcine herniation and partial effacement of the basal cisterns. Probable early trapping of the left lateral ventricle. New right temporal hypoattenuation likely reflects subacute or chronic infarct. Chronic bifrontal encephalomalacia. No acute cervical spine fracture. Electronically Signed   By: Guadlupe Spanish M.D.   On: 2019-09-09 17:23    ____________________________________________   PROCEDURES  Procedure(s) performed:   Date/Time: 2019/09/09 4:50 PM Performed by: Maia Plan, MD Pre-anesthesia Checklist: Patient identified, Emergency Drugs available, Suction available and Patient being monitored Oxygen Delivery Method: Nasal cannula Preoxygenation: Pre-oxygenation with 100% oxygen Induction Type: Rapid sequence Ventilation: Mask ventilation without difficulty Laryngoscope Size: Glidescope and 3 Grade View: Grade II Tube size: 7.5 mm Number of attempts: 1 Airway Equipment and Method: Patient positioned with wedge pillow Placement Confirmation: ETT inserted through vocal cords under direct vision,  CO2 detector and Breath sounds checked- equal and bilateral Secured at: 23 cm Tube secured with: ETT holder Dental Injury: Teeth and  Oropharynx as per pre-operative assessment      .Critical Care Performed by: Maia Plan, MD Authorized by: Maia Plan, MD   Critical care provider statement:    Critical care time (minutes):  45   Critical care time was exclusive of:  Separately billable procedures and treating other patients and teaching time   Critical care was necessary to treat or prevent imminent or life-threatening deterioration of the following conditions:  CNS failure or compromise and respiratory failure   Critical care was time spent personally by me on the following activities:  Blood draw for specimens, development of treatment plan with patient or surrogate, discussions with consultants, evaluation of patient's response to treatment, examination of patient, obtaining history from patient or surrogate, ordering and performing treatments and interventions, ordering and review of laboratory studies, pulse oximetry, ordering and review of radiographic studies, re-evaluation of patient's condition, review of old charts and ventilator management   I assumed direction of critical care for this patient from another provider in  my specialty: no       ____________________________________________   INITIAL IMPRESSION / ASSESSMENT AND PLAN / ED COURSE  Pertinent labs & imaging results that were available during my care of the patient were reviewed by me and considered in my medical decision making (see chart for details).   Patient presents to the emergency department as a code stroke with deteriorating mental status after a fall.  She is on Eliquis with last dose thought to be at 8 AM.  Required intubation for airway protection on arrival.  EMS initially reported the patient was full code.  The son arrived shortly afterwards and advised that his mother does have a DNR but did agree with initial intubation, imaging, and further discussion once results are back.  I have very strong suspicion that the patient is  having an acute intracranial hemorrhage secondary to trauma.  She is hyperglycemic and tachycardic.  EKG shows bundle branch block similar to prior.  Head of bed elevated and started on propofol.   05:00 PM  Paged neurosurgery after I reviewed the CT head imaging a large acute subdural hemorrhage with shift.  Appears to have an epidural hemorrhage component as well but will review with neurosurgery.  Discussed with the patient's son at bedside.   05:12 PM  Spoke with Dr. Christella Noa with NSG. He will be down to discuss with family.  After speaking with NSG, patient's son ok with terminal extubation and comfort measures. Will start morphine infusion and extubate.    06:08 PM  Extubated now on morphine infusion.  Family at bedside.  Patient placed on nonrebreather for comfort.  Ativan as needed for air hunger or agitation.   Discussed patient's case with [facility] to request admission. Patient and family (if present) updated with plan. Care transferred to [facility] service.  I reviewed all nursing notes, vitals, pertinent old records, EKGs, labs, imaging (as available).  ____________________________________________  FINAL CLINICAL IMPRESSION(S) / ED DIAGNOSES  Final diagnoses:  SDH (subdural hematoma) (HCC)  Acute respiratory failure, unspecified whether with hypoxia or hypercapnia ([place])  Fall, initial encounter     MEDICATIONS GIVEN DURING THIS VISIT:  Medications  sodium chloride flush (NS) 0.9 % injection 3 mL (has no administration in time range)  propofol (DIPRIVAN) 10 mg/mL bolus/IV push (has no administration in time range)  morphine 100mg  in NS 160mL (1mg /mL) infusion - premix (5 mg/hr Intravenous New Bag/Given 09-12-2019 1758)  LORazepam (ATIVAN) 2 MG/ML injection (has no administration in time range)  acetaminophen (TYLENOL) tablet 650 mg (has no administration in time range)    Or  acetaminophen (TYLENOL) suppository 650 mg (has no administration in time range)  haloperidol (HALDOL)  tablet 0.5 mg (has no administration in time range)    Or  haloperidol (HALDOL) 2 MG/ML solution 0.5 mg (has no administration in time range)    Or  haloperidol lactate (HALDOL) injection 0.5 mg (has no administration in time range)  ondansetron (ZOFRAN-ODT) disintegrating tablet 4 mg (has no administration in time range)    Or  ondansetron (ZOFRAN) injection 4 mg (has no administration in time range)  glycopyrrolate (ROBINUL) tablet 1 mg (has no administration in time range)    Or  glycopyrrolate (ROBINUL) injection 0.2 mg (has no administration in time range)    Or  glycopyrrolate (ROBINUL) injection 0.2 mg (has no administration in time range)  antiseptic oral rinse (BIOTENE) solution 15 mL (has no administration in time range)  polyvinyl alcohol (LIQUIFILM TEARS) 1.4 % ophthalmic solution 1 drop (  has no administration in time range)  HYDROmorphone (DILAUDID) injection 0.5 mg (has no administration in time range)  LORazepam (ATIVAN) tablet 1 mg (has no administration in time range)    Or  LORazepam (ATIVAN) 2 MG/ML concentrated solution 1 mg (has no administration in time range)    Or  LORazepam (ATIVAN) injection 1 mg (has no administration in time range)  bisacodyl (DULCOLAX) suppository 10 mg (has no administration in time range)  chlorproMAZINE (THORAZINE) 12.5 mg in sodium chloride 0.9 % 25 mL IVPB (has no administration in time range)  propofol (DIPRIVAN) 1000 MG/100ML infusion (  Stopped 08/22/2019 1746)  propofol (DIPRIVAN) 10 mg/mL bolus/IV push (50 mg Intravenous Given 08/01/2019 1629)  succinylcholine (ANECTINE) injection (80 mg Intravenous Given 07/26/2019 1715)  LORazepam (ATIVAN) injection 2 mg (2 mg Intravenous Given 07/29/2019 1808)    Note:  This document was prepared using Dragon voice recognition software and may include unintentional dictation errors.  Alona Bene, MD, FACEP Emergency Medicine    , Arlyss Repress, MD 08/20/2019 Marcelline Mates, MD 08/04/2019  1902

## 2019-08-17 NOTE — Progress Notes (Signed)
Patient was just transferred from the ED. Patient is resting and was made to be comfortable. Patient's son, Minerva Fester, was oriented to the room and remains holding her hand at the bedside.

## 2019-08-17 NOTE — Consult Note (Signed)
Neurology Consultation Reason for Consult: Unresponsiveness Referring Physician: Long, J  CC: Altered mental status  History is obtained from: EMS  HPI: Lisa [name] is a 85 y.o. female with a history of anticoagulation who was brought into the emergency department with deteriorating mental status after a fall.  Apparently when EMS first arrived, she was able to ambulate, but she rapidly declined in route.  On arrival, she was posturing and not protecting her airway and therefore was emergently intubated.  She was taken for emergent head CT which revealed a large subdural hematoma. There was some jaw clenching and concern for seizure, but no definite seizure activity. .    LKW: Unclear, prior to fall tpa given?: no, ICH   ROS: Unable to obtain due to altered mental status.   Past Medical History:  Diagnosis Date  . Allergy   . Allergy to shellfish    causes swelling  . Brain bleed ([place])   . Chronic back pain   . Chronic systolic CHF (congestive heart failure) ([place])   . Depression    unspecified  . Foot fracture    bilateral  . History of abnormal mammogram    Status post breast biopsy x 2, which were negative.   [name] History of chicken pox   . History of sinus tachycardia 11/1997   With dyspnea on exertion. Workup included a VQ scan which was normal, and ultrasound of her lower extremities, which was normal with no evidence of DVT.  [name] Hypertension   . Nocturnal leg cramps   . Osteoarthritis   . Osteoporosis   . Pelvis fracture ([place])   . Peptic ulcer disease   . Renal disorder   . Spinal stenosis   . V-tach ([place])   . Wrist fracture, right      Family History  Problem Relation Age of Onset  . COPD Mother   . High blood pressure Mother   . Lung disease Mother   . COPD Father   . Heart disease Father   . High blood pressure Father   . Heart attack Father   . Skin cancer Brother      Social History:  reports that she has never smoked. She has never used smokeless  tobacco. She reports current alcohol use. She reports that she does not use drugs.   Exam: Current vital signs: BP (!) 175/72   Pulse 90   Resp 19   Ht 5\' 3"  (1.6 m)   SpO2 100%   BMI 33.66 kg/m  Vital signs in last 24 hours: Pulse Rate:  [70-115] 90 (11/25 1815) Resp:  [12-24] 19 (11/25 1815) BP: (128-189)/(66-100) 175/72 (11/25 1815) SpO2:  [98 %-100 %] 100 % (11/25 1815) FiO2 (%):  [100 %] 100 % (11/25 1802)   Physical Exam  Constitutional: Appears elderly Psych: Does not respond Eyes: No scleral injection HENT: No OP obstrucion MSK: no joint deformities.  Cardiovascular: Normal rate and regular rhythm.  Respiratory: Effort normal, non-labored breathing GI: Soft.  No distension. There is no tenderness.  Skin: WDI  Neuro: Mental Status: Patient is obtunded, does not open eyes or follow commands  cranial Nerves: II: Does not blink to threat. pupils are equal, round, and reactive to light.   III,IV, VI: eyes are midline, doll's intact.  V, VII: corneals intact Motor: Extensor on the left, flexion on the right to nox stim.  Sensory: As above.  Cerebellar: Does not perform   I have reviewed labs in epic and the  results pertinent to this consultation are: Glucose 605.   I have reviewed the images obtained:CT head - large SDH  Impression: 85 yo F with likely fatal hemorrhage at her age with anticoagulation. Neurosurgery has been consulted. I have low suspicion that the jaw clenching was seizure, but if aggressive care is continued, could consider keppra.   Recommendations: 1) Agree with neurosurgical consultation.  2) If aggressive care is continued,    This patient is critically ill and at significant risk of neurological worsening, death and care requires constant monitoring of vital signs, hemodynamics,respiratory and cardiac monitoring, neurological assessment, discussion with family, other specialists and medical decision making of high complexity. I spent  35 minutes of neurocritical care time  in the care of  this patient. This was time spent independent of any time provided by nurse practitioner or PA.  Ritta Slot, MD [facility] [phone]  If 7pm- 7am, please page neurology on call as listed in AMION. 08/01/2019  7:33 PM

## 2019-08-17 NOTE — Progress Notes (Signed)
Patient was transported to CT & back to trauma A without any complications.

## 2019-08-17 NOTE — ED Triage Notes (Signed)
Pt to ED via EMS after reported having a fall at home after tripping over a shoe.  Enroute to ED pt became unresponsive   Pt has small lac to back left side of head, pt is on blood thinners

## 2019-08-17 NOTE — Progress Notes (Signed)
No orders for ABG at this time due to having orders to extubate when family arrives.

## 2019-08-17 NOTE — ED Notes (Signed)
Family at bedside with pt at this time  NRB placed on pt.   Pt unresponsive to verbal and painful stimuli

## 2019-08-17 NOTE — H&P (Signed)
History and Physical    Lisa [name]:[id number] DOB: 01-Mar-1927 DOA: 07/25/2019  PCP: Kandyce Rud, MD  Patient coming from: Home  I have personally briefly reviewed patient's old medical records in [facility]  Chief Complaint: Fall at home  HPI: Lisa [name] is a 85 y.o. female with medical history significant for atrial fibrillation on Eliquis, chronic systolic CHF, hypertension, hypothyroidism who presents to the ED for evaluation after a fall at home.  Patient is unresponsive therefore entirety of history is obtained from EDP, chart review, and family at bedside.  Patient was brought into the ED with altered mental status after suffering a fall at home with injury to her head.  Per ED documentation, EMS were called and on their arrival patient was awake, alert, and able to ambulate to the front door.  Soon after she became less responsive with speech changes.  She had last taken Eliquis around 8 AM.  ED Course:  Patient was initially brought in as a code stroke, she was intubated for airway protection on arrival.  Initial vitals showed BP 138/67, pulse 99, RR 24, SPO2 98% on 100% FiO2 while mechanically ventilated.  Labs show WBC 12.3, hemoglobin 12.9, platelets 230,000, PT 15.7, INR 1.3, PTT 30, sodium 132, potassium 4.4, bicarb 22, BUN 28, creatinine 2.28, serum glucose 605.  CT head and cervical spine without contrast showed a large acute right sided subdural hematoma with subfalcine herniation as well as subacute or chronic infarct of the right temporal region.  Neurosurgery were consulted who reviewed the case and did not feel this was a survivable surgical insult.  This was discussed with the family and patient was status was changed to DNR and patient was extubated and started on IV morphine, Ativan, and nonrebreather for full comfort care measures.  The hospitalist service was consulted for further management.  Review of Systems:  Unable to obtain full review of  systems as patient is unresponsive.   Past Medical History:  Diagnosis Date   Allergy    Allergy to shellfish    causes swelling   Brain bleed (HCC)    Chronic back pain    Chronic systolic CHF (congestive heart failure) (HCC)    Depression    unspecified   Foot fracture    bilateral   History of abnormal mammogram    Status post breast biopsy x 2, which were negative.    History of chicken pox    History of sinus tachycardia 11/1997   With dyspnea on exertion. Workup included a VQ scan which was normal, and ultrasound of her lower extremities, which was normal with no evidence of DVT.   Hypertension    Nocturnal leg cramps    Osteoarthritis    Osteoporosis    Pelvis fracture (HCC)    Peptic ulcer disease    Renal disorder    Spinal stenosis    V-tach (HCC)    Wrist fracture, right     Past Surgical History:  Procedure Laterality Date   BACK SURGERY N/A    BREAST LUMPECTOMY N/A    FRACTURE SURGERY  08/25/2013   ORIF left distal radius fracture.   WISDOM TOOTH EXTRACTION      Social History:  reports that she has never smoked. She has never used smokeless tobacco. She reports current alcohol use. She reports that she does not use drugs.  Allergies  Allergen Reactions   Ambien [Zolpidem Tartrate] Other (See Comments)    Reaction:  Hallucinations  Daypro [Oxaprozin] Other (See Comments)    GI upset   Oxycodone Nausea And Vomiting   Shellfish Allergy Swelling and Other (See Comments)    Pts mouth and face swells.    Ace Inhibitors Cough   Norvasc [Amlodipine Besylate] Palpitations    Family History  Problem Relation Age of Onset   COPD Mother    High blood pressure Mother    Lung disease Mother    COPD Father    Heart disease Father    High blood pressure Father    Heart attack Father    Skin cancer Brother      Prior to Admission medications   Medication Sig Start Date End Date Taking? Authorizing Provider    diclofenac Sodium (VOLTAREN) 1 % GEL Apply 2 g topically 4 (four) times daily. 08/02/19 08/01/20 Yes [provider]  acetaminophen (TYLENOL) 500 MG tablet Take 1,000 mg by mouth 3 (three) times daily.    [provider]  ALLERGY RELIEF 10 MG tablet TAKE ONE TABLET EVERY DAY Patient taking differently: Take 10 mg by mouth daily.  07/19/18   Leslye Peer, MD  amiodarone (PACERONE) 200 MG tablet TAKE ONE-HALF TABLET EVERY DAY Patient taking differently: Take 100 mg by mouth daily.  05/18/19   Iran Ouch, MD  citalopram (CELEXA) 20 MG tablet Take 20 mg by mouth daily.     [provider]  CITRUCEL 500 MG TABS Take 500 mg by mouth daily. 08/08/19   [provider]  ELIQUIS 2.5 MG TABS tablet TAKE ONE TABLET BY MOUTH TWICE DAILY Patient taking differently: Take 2.5 mg by mouth 2 (two) times daily.  05/18/19   Iran Ouch, MD  erythromycin ophthalmic ointment 1 application as needed.  03/07/19   [provider]  fluticasone (FLONASE) 50 MCG/ACT nasal spray Place 2 sprays into both nostrils daily. 08/08/19   [provider]  furosemide (LASIX) 20 MG tablet TAKE ONE TABLET EVERY DAY Patient taking differently: Take 20 mg by mouth daily.  12/15/18   Iran Ouch, MD  gabapentin (NEURONTIN) 100 MG capsule TAKE 1 CAPSULE BY MOUTH AT BEDTIME Patient taking differently: Take 100 mg by mouth at bedtime.  05/18/19   Ranelle Oyster, MD  irbesartan (AVAPRO) 300 MG tablet Take 150 mg by mouth daily. 06/27/19   [provider]  levothyroxine (SYNTHROID, LEVOTHROID) 50 MCG tablet Take 50 mcg by mouth daily before breakfast.     [provider]  Melatonin 3 MG SUBL Take 3 mg by mouth at bedtime. 06/27/19   [provider]  metoprolol tartrate (LOPRESSOR) 25 MG tablet Take 12.5 mg by mouth 2 (two) times daily.  02/11/16   [provider]  mirtazapine (REMERON) 15 MG tablet Take 15 mg by mouth at bedtime.   02/11/16   [provider]  Multiple Vitamin (MULTI-VITAMINS) TABS Take 1 tablet by mouth every morning. 05/18/19   [provider]  Multiple Vitamins-Minerals (PRESERVISION AREDS 2+MULTI VIT) CAPS Take 1 capsule by mouth daily.     [provider]  omeprazole (PRILOSEC) 20 MG capsule Take 20 mg by mouth daily. 05/18/19   [provider]  primidone (MYSOLINE) 50 MG tablet Take 50 mg by mouth 2 (two) times daily.    [provider]  risperiDONE (RISPERDAL) 0.25 MG tablet TAKE ONE TABLET AT BEDTIME Patient taking differently: Take 0.25 mg by mouth at bedtime.  05/18/19   Ranelle Oyster, MD  valACYclovir (VALTREX) 1000 MG  tablet Take 1 tablet (1,000 mg total) by mouth daily. 01/19/18   Delfino LovettShah, Vipul, MD  VENTOLIN HFA 108 (90 Base) MCG/ACT inhaler Inhale 2 puffs into the lungs every 6 (six) hours as needed for shortness of breath. 08/08/19   [provider]    Physical Exam: Vitals:   08/07/2019 1745 08/22/2019 1800 07/31/2019 1802 08/07/2019 1815  BP: (!) 186/87 (!) 185/74 (!) 185/74 (!) 175/72  Pulse: 82 70 100 90  Resp: 18 18 12 19   SpO2: 100% 100%  100%  Height:       Constitutional: Unresponsive, resting supine in bed currently on nonrebreather Eyes: lids and conjunctivae normal ENMT: Mucous membranes are moist. Posterior pharynx clear of any exudate or lesions. Neck: no masses. Respiratory: clear to auscultation anteriorly, agonal breathing Cardiovascular: Regular rate and rhythm, no murmurs / rubs / gallops. Abdomen: no masses palpated. No hepatosplenomegaly.  Musculoskeletal: no clubbing / cyanosis.  Not moving extremities. Skin:  No induration Neurologic: Unresponsive, not moving extremities Psychiatric: Unresponsive    Labs on Admission: I have personally reviewed following labs and imaging studies  CBC: Recent Labs  Lab 08/02/2019 1624 07/25/2019 1633  WBC 12.3*  --   NEUTROABS 6.5  --   HGB 12.9 14.3  HCT 38.8 42.0  MCV 104.6*   --   PLT 230  --    Basic Metabolic Panel: Recent Labs  Lab 08/08/2019 1624 07/24/2019 1633  NA 132* 132*  K 4.4 4.5  CL 93* 96*  CO2 22  --   GLUCOSE 605* 612*  BUN 28* 31*  CREATININE 2.28* 2.00*  CALCIUM 9.6  --    GFR: CrCl cannot be calculated (Unknown ideal weight.). Liver Function Tests: Recent Labs  Lab 08/08/2019 1624  AST 50*  ALT 30  ALKPHOS 95  BILITOT 0.6  PROT 8.0  ALBUMIN 3.9   No results for input(s): LIPASE, AMYLASE in the last 168 hours. No results for input(s): AMMONIA in the last 168 hours. Coagulation Profile: Recent Labs  Lab 08/03/2019 1624  INR 1.3*   Cardiac Enzymes: No results for input(s): CKTOTAL, CKMB, CKMBINDEX, TROPONINI in the last 168 hours. BNP (last 3 results) No results for input(s): PROBNP in the last 8760 hours. HbA1C: No results for input(s): HGBA1C in the last 72 hours. CBG: Recent Labs  Lab 07/30/2019 1637  GLUCAP 588*   Lipid Profile: No results for input(s): CHOL, HDL, LDLCALC, TRIG, CHOLHDL, LDLDIRECT in the last 72 hours. Thyroid Function Tests: No results for input(s): TSH, T4TOTAL, FREET4, T3FREE, THYROIDAB in the last 72 hours. Anemia Panel: No results for input(s): VITAMINB12, FOLATE, FERRITIN, TIBC, IRON, RETICCTPCT in the last 72 hours. Urine analysis:    Component Value Date/Time   COLORURINE YELLOW (A) 02/10/2018 1130   APPEARANCEUR CLOUDY (A) 02/10/2018 1130   APPEARANCEUR Hazy 10/31/2013 1259   LABSPEC 1.008 02/10/2018 1130   LABSPEC 1.017 10/31/2013 1259   PHURINE 5.0 02/10/2018 1130   GLUCOSEU NEGATIVE 02/10/2018 1130   GLUCOSEU Negative 10/31/2013 1259   HGBUR NEGATIVE 02/10/2018 1130   BILIRUBINUR NEGATIVE 02/10/2018 1130   BILIRUBINUR Negative 10/31/2013 1259   KETONESUR NEGATIVE 02/10/2018 1130   PROTEINUR NEGATIVE 02/10/2018 1130   UROBILINOGEN 0.2 02/21/2010 1109   NITRITE NEGATIVE 02/10/2018 1130   LEUKOCYTESUR LARGE (A) 02/10/2018 1130   LEUKOCYTESUR 3+ 10/31/2013 1259    Radiological  Exams on Admission: Ct Cervical Spine Wo Contrast  Result Date: 08/15/2019 CLINICAL DATA:  Code stroke.  Fall EXAM: CT HEAD WITHOUT CONTRAST CT CERVICAL  SPINE WITHOUT CONTRAST TECHNIQUE: Multidetector CT imaging of the head and cervical spine was performed following the standard protocol without intravenous contrast. Multiplanar CT image reconstructions of the cervical spine were also generated. COMPARISON:  2017 FINDINGS: CT HEAD FINDINGS Brain: There is a large mixed density but primarily hyperattenuating subdural hematoma along the right cerebral convexity. This measures up to 3.5 cm in maximal thickness along the frontal lobe. Small volume subdural blood is also present along the falx. Subfalcine herniation is present with leftward midline shift measuring 1.2 cm. There is likely early trapping of the left lateral ventricle. Partial effacement of the suprasellar and perimesencephalic cisterns. Chronic bifrontal encephalomalacia anteroinferiorly. There is new hypoattenuation with loss of gray-white differentiation in the right temporal lobe. Vascular: There is intracranial atherosclerotic calcification at the skull base. Skull: Calvarium is intact. Sinuses/Orbits: Aerated.  No acute orbital abnormality. Other: Mastoid air cells are clear. Left occipital scalp soft tissue swelling and laceration. CT CERVICAL SPINE FINDINGS Alignment: Nonspecific straightening of the cervical lordosis. Trace anterolisthesis at C7-T1. Skull base and vertebrae: Vertebral body heights are maintained apart from degenerative endplate irregularity. There is no acute fracture. Soft tissues and spinal canal: Spinal canal is poorly evaluated due to artifact. Disc levels: Advanced multilevel degenerative changes including disc height loss, endplate osteophytes, and facet and uncovertebral joint hypertrophy. Upper chest: Scarring at the lung apices. Other: None. IMPRESSION: Large acute right hemispheric subdural hematoma with subfalcine  herniation and partial effacement of the basal cisterns. Probable early trapping of the left lateral ventricle. New right temporal hypoattenuation likely reflects subacute or chronic infarct. Chronic bifrontal encephalomalacia. No acute cervical spine fracture. Electronically Signed   By: Guadlupe Spanish M.D.   On: 08/11/2019 17:23   Dg Chest Portable 1 View  Result Date: 07/27/2019 CLINICAL DATA:  Intubation. EXAM: PORTABLE CHEST 1 VIEW COMPARISON:  Chest x-ray 01/15/2018. FINDINGS: Endotracheal tube tip 1.8 cm above the lower portion of the carina. Proximal repositioning of approximately 2 cm should be considered. Low lung volumes with bibasilar atelectasis. Mild left base infiltrate cannot be excluded. Small left pleural effusion cannot be excluded. Biapical pleural thickening noted consistent scarring. No pneumothorax. Cardiomegaly. No pulmonary venous congestion. No acute bony abnormality. IMPRESSION: 1. Endotracheal tube tip 1.8 cm above the lower portion of the carina. Proximal repositioning of approximately 2 cm should be considered. 2. Low lung volumes with bibasilar atelectasis. Mild left base infiltrate cannot be excluded. Small left pleural effusion cannot be excluded. Electronically Signed   By: Maisie Fus  Register   On: 08/22/2019 17:31   Ct Head Code Stroke Wo Contrast  Result Date: 07/26/2019 CLINICAL DATA:  Code stroke.  Fall EXAM: CT HEAD WITHOUT CONTRAST CT CERVICAL SPINE WITHOUT CONTRAST TECHNIQUE: Multidetector CT imaging of the head and cervical spine was performed following the standard protocol without intravenous contrast. Multiplanar CT image reconstructions of the cervical spine were also generated. COMPARISON:  2017 FINDINGS: CT HEAD FINDINGS Brain: There is a large mixed density but primarily hyperattenuating subdural hematoma along the right cerebral convexity. This measures up to 3.5 cm in maximal thickness along the frontal lobe. Small volume subdural blood is also present along  the falx. Subfalcine herniation is present with leftward midline shift measuring 1.2 cm. There is likely early trapping of the left lateral ventricle. Partial effacement of the suprasellar and perimesencephalic cisterns. Chronic bifrontal encephalomalacia anteroinferiorly. There is new hypoattenuation with loss of gray-white differentiation in the right temporal lobe. Vascular: There is intracranial atherosclerotic calcification at the skull base. Skull:  Calvarium is intact. Sinuses/Orbits: Aerated.  No acute orbital abnormality. Other: Mastoid air cells are clear. Left occipital scalp soft tissue swelling and laceration. CT CERVICAL SPINE FINDINGS Alignment: Nonspecific straightening of the cervical lordosis. Trace anterolisthesis at C7-T1. Skull base and vertebrae: Vertebral body heights are maintained apart from degenerative endplate irregularity. There is no acute fracture. Soft tissues and spinal canal: Spinal canal is poorly evaluated due to artifact. Disc levels: Advanced multilevel degenerative changes including disc height loss, endplate osteophytes, and facet and uncovertebral joint hypertrophy. Upper chest: Scarring at the lung apices. Other: None. IMPRESSION: Large acute right hemispheric subdural hematoma with subfalcine herniation and partial effacement of the basal cisterns. Probable early trapping of the left lateral ventricle. New right temporal hypoattenuation likely reflects subacute or chronic infarct. Chronic bifrontal encephalomalacia. No acute cervical spine fracture. Electronically Signed   By: Macy Mis M.D.   On: 09-Sep-2019 17:23    EKG: Independently reviewed.  Sinus tachycardia, LBBB.  Assessment/Plan Active Problems:   Subdural hematoma, acute (HCC)   Comfort measures only status  Lisa [name] is a 85 y.o. female with medical history significant for atrial fibrillation on Eliquis, chronic systolic CHF, hypertension, hypothyroidism who is admitted for comfort care measures  after developing a large traumatic right-sided subdural hematoma.   Traumatic large right-sided subdural hematoma Comfort care measures: Patient currently unresponsive, initially intubated in the ED for airway protection however after neurosurgery evaluation of CT imaging and discussion with family patient was extubated and changed to full comfort care measures as this was felt not to be a survivable condition.  Anticipate in-hospital death within the next 24 hours. -DO NOT RESUSCITATE -Continue IV morphine, as needed Ativan, supportive care per end-of-life comfort care measures  DVT prophylaxis: None Code Status: DNR, full comfort care measures.  Confirmed with son at bedside. Family Communication: Discussed with son and granddaughter at bedside. Disposition Plan: Anticipate in-hospital death Consults called: Neurosurgery Admission status: Observation   Zada Finders MD [facility]  If 7PM-7AM, please contact night-coverage [email or web address]  2019-09-09, 6:55 PM

## 2019-08-17 NOTE — Progress Notes (Signed)
Patient ID: Lisa [name], female   DOB: 1927/06/16, 85 y.o.   MRN: [id number] Head CT reviewed. Unfortunately this is not a survivable cerebral insult. I have explained this to her son.

## 2019-08-17 NOTE — ED Notes (Signed)
Dr. Christella Noa at bedside to talk with family

## 2019-08-17 NOTE — Procedures (Signed)
Extubation Procedure Note  Patient Details:   Name: Lisa [name] DOB: 1927/07/26 MRN: [id number]   Airway Documentation:    Vent end date: August 24, 2019 Vent end time: 1802   Evaluation  O2 sats: stable throughout Complications: No apparent complications Patient did tolerate procedure well. Bilateral Breath Sounds: Clear   No   Patient was extubated to comfort care with RN and MD at the bedside. Patient was placed on a NRB by ED MD after extubation. Patient's family was given this RT's condolences and welcomed back into the room following extubation.   Claretta Fraise 08-24-2019, 6:02 PM

## 2019-08-18 DIAGNOSIS — R4182 Altered mental status, unspecified: Secondary | ICD-10-CM | POA: Diagnosis present

## 2019-08-18 DIAGNOSIS — I4891 Unspecified atrial fibrillation: Secondary | ICD-10-CM | POA: Diagnosis present

## 2019-08-18 DIAGNOSIS — I420 Dilated cardiomyopathy: Secondary | ICD-10-CM | POA: Diagnosis present

## 2019-08-18 DIAGNOSIS — G8929 Other chronic pain: Secondary | ICD-10-CM | POA: Diagnosis present

## 2019-08-18 DIAGNOSIS — I110 Hypertensive heart disease with heart failure: Secondary | ICD-10-CM | POA: Diagnosis present

## 2019-08-18 DIAGNOSIS — I5022 Chronic systolic (congestive) heart failure: Secondary | ICD-10-CM | POA: Diagnosis present

## 2019-08-18 DIAGNOSIS — Z8249 Family history of ischemic heart disease and other diseases of the circulatory system: Secondary | ICD-10-CM | POA: Diagnosis not present

## 2019-08-18 DIAGNOSIS — Y92009 Unspecified place in unspecified non-institutional (private) residence as the place of occurrence of the external cause: Secondary | ICD-10-CM | POA: Diagnosis not present

## 2019-08-18 DIAGNOSIS — Z66 Do not resuscitate: Secondary | ICD-10-CM | POA: Diagnosis present

## 2019-08-18 DIAGNOSIS — S065X9A Traumatic subdural hemorrhage with loss of consciousness of unspecified duration, initial encounter: Secondary | ICD-10-CM | POA: Diagnosis present

## 2019-08-18 DIAGNOSIS — M1990 Unspecified osteoarthritis, unspecified site: Secondary | ICD-10-CM | POA: Diagnosis present

## 2019-08-18 DIAGNOSIS — Z91013 Allergy to seafood: Secondary | ICD-10-CM | POA: Diagnosis not present

## 2019-08-18 DIAGNOSIS — Z885 Allergy status to narcotic agent status: Secondary | ICD-10-CM | POA: Diagnosis not present

## 2019-08-18 DIAGNOSIS — Z20828 Contact with and (suspected) exposure to other viral communicable diseases: Secondary | ICD-10-CM | POA: Diagnosis present

## 2019-08-18 DIAGNOSIS — W19XXXA Unspecified fall, initial encounter: Secondary | ICD-10-CM | POA: Diagnosis present

## 2019-08-18 DIAGNOSIS — Z825 Family history of asthma and other chronic lower respiratory diseases: Secondary | ICD-10-CM | POA: Diagnosis not present

## 2019-08-18 DIAGNOSIS — M549 Dorsalgia, unspecified: Secondary | ICD-10-CM | POA: Diagnosis present

## 2019-08-18 DIAGNOSIS — Z515 Encounter for palliative care: Secondary | ICD-10-CM

## 2019-08-18 DIAGNOSIS — M810 Age-related osteoporosis without current pathological fracture: Secondary | ICD-10-CM | POA: Diagnosis present

## 2019-08-18 DIAGNOSIS — Z888 Allergy status to other drugs, medicaments and biological substances status: Secondary | ICD-10-CM | POA: Diagnosis not present

## 2019-08-18 DIAGNOSIS — Z808 Family history of malignant neoplasm of other organs or systems: Secondary | ICD-10-CM | POA: Diagnosis not present

## 2019-08-18 MED ORDER — HYDROMORPHONE HCL 1 MG/ML IJ SOLN
1.0000 mg | INTRAMUSCULAR | Status: DC | PRN
  Administered 2019-08-18 (×2): 1 mg via INTRAVENOUS
  Filled 2019-08-18 (×2): qty 1

## 2019-08-23 NOTE — Death Summary Note (Signed)
DEATH SUMMARY   Patient Details  Name: Lisa [name] MRN: [id number] DOB: Jan 18, 1927  Admission/Discharge Information   Admit Date:  08-27-19  Date of Death: Date of Death: 08-28-2019  Time of Death: Time of Death: 1435  Length of Stay: 0  Referring Physician: Kandyce Rud, MD   Reason(s) for Hospitalization  Fall resulting in SDH  Diagnoses  Preliminary cause of death: SDH (subdural hematoma) (HCC) Secondary Diagnoses (including complications and co-morbidities):  Active Problems:   Subdural hematoma, acute (HCC)   Comfort measures only status   Brief Hospital Course (including significant findings, care, treatment, and services provided and events leading to death)   Lisa [name] is a 85 y.o. female with medical history significant for atrial fibrillation on Eliquis, chronic systolic CHF, hypertension, hypothyroidism who is admitted for comfort care measures after developing a large traumatic right-sided subdural hematoma   Pertinent Labs and Studies  Significant Diagnostic Studies Ct Cervical Spine Wo Contrast  Result Date: 08-27-2019 CLINICAL DATA:  Code stroke.  Fall EXAM: CT HEAD WITHOUT CONTRAST CT CERVICAL SPINE WITHOUT CONTRAST TECHNIQUE: Multidetector CT imaging of the head and cervical spine was performed following the standard protocol without intravenous contrast. Multiplanar CT image reconstructions of the cervical spine were also generated. COMPARISON:  2017 FINDINGS: CT HEAD FINDINGS Brain: There is a large mixed density but primarily hyperattenuating subdural hematoma along the right cerebral convexity. This measures up to 3.5 cm in maximal thickness along the frontal lobe. Small volume subdural blood is also present along the falx. Subfalcine herniation is present with leftward midline shift measuring 1.2 cm. There is likely early trapping of the left lateral ventricle. Partial effacement of the suprasellar and perimesencephalic cisterns. Chronic bifrontal  encephalomalacia anteroinferiorly. There is new hypoattenuation with loss of gray-white differentiation in the right temporal lobe. Vascular: There is intracranial atherosclerotic calcification at the skull base. Skull: Calvarium is intact. Sinuses/Orbits: Aerated.  No acute orbital abnormality. Other: Mastoid air cells are clear. Left occipital scalp soft tissue swelling and laceration. CT CERVICAL SPINE FINDINGS Alignment: Nonspecific straightening of the cervical lordosis. Trace anterolisthesis at C7-T1. Skull base and vertebrae: Vertebral body heights are maintained apart from degenerative endplate irregularity. There is no acute fracture. Soft tissues and spinal canal: Spinal canal is poorly evaluated due to artifact. Disc levels: Advanced multilevel degenerative changes including disc height loss, endplate osteophytes, and facet and uncovertebral joint hypertrophy. Upper chest: Scarring at the lung apices. Other: None. IMPRESSION: Large acute right hemispheric subdural hematoma with subfalcine herniation and partial effacement of the basal cisterns. Probable early trapping of the left lateral ventricle. New right temporal hypoattenuation likely reflects subacute or chronic infarct. Chronic bifrontal encephalomalacia. No acute cervical spine fracture. Electronically Signed   By: Guadlupe Spanish M.D.   On: 27-Aug-2019 17:23   Dg Chest Portable 1 View  Result Date: 08/27/2019 CLINICAL DATA:  Intubation. EXAM: PORTABLE CHEST 1 VIEW COMPARISON:  Chest x-ray 01/15/2018. FINDINGS: Endotracheal tube tip 1.8 cm above the lower portion of the carina. Proximal repositioning of approximately 2 cm should be considered. Low lung volumes with bibasilar atelectasis. Mild left base infiltrate cannot be excluded. Small left pleural effusion cannot be excluded. Biapical pleural thickening noted consistent scarring. No pneumothorax. Cardiomegaly. No pulmonary venous congestion. No acute bony abnormality. IMPRESSION: 1.  Endotracheal tube tip 1.8 cm above the lower portion of the carina. Proximal repositioning of approximately 2 cm should be considered. 2. Low lung volumes with bibasilar atelectasis. Mild left base infiltrate cannot be excluded. Small  left pleural effusion cannot be excluded. Electronically Signed   By: Marcello Moores  Register   On: 08/10/2019 17:31   Ct Head Code Stroke Wo Contrast  Result Date: 07/24/2019 CLINICAL DATA:  Code stroke.  Fall EXAM: CT HEAD WITHOUT CONTRAST CT CERVICAL SPINE WITHOUT CONTRAST TECHNIQUE: Multidetector CT imaging of the head and cervical spine was performed following the standard protocol without intravenous contrast. Multiplanar CT image reconstructions of the cervical spine were also generated. COMPARISON:  2017 FINDINGS: CT HEAD FINDINGS Brain: There is a large mixed density but primarily hyperattenuating subdural hematoma along the right cerebral convexity. This measures up to 3.5 cm in maximal thickness along the frontal lobe. Small volume subdural blood is also present along the falx. Subfalcine herniation is present with leftward midline shift measuring 1.2 cm. There is likely early trapping of the left lateral ventricle. Partial effacement of the suprasellar and perimesencephalic cisterns. Chronic bifrontal encephalomalacia anteroinferiorly. There is new hypoattenuation with loss of gray-white differentiation in the right temporal lobe. Vascular: There is intracranial atherosclerotic calcification at the skull base. Skull: Calvarium is intact. Sinuses/Orbits: Aerated.  No acute orbital abnormality. Other: Mastoid air cells are clear. Left occipital scalp soft tissue swelling and laceration. CT CERVICAL SPINE FINDINGS Alignment: Nonspecific straightening of the cervical lordosis. Trace anterolisthesis at C7-T1. Skull base and vertebrae: Vertebral body heights are maintained apart from degenerative endplate irregularity. There is no acute fracture. Soft tissues and spinal canal:  Spinal canal is poorly evaluated due to artifact. Disc levels: Advanced multilevel degenerative changes including disc height loss, endplate osteophytes, and facet and uncovertebral joint hypertrophy. Upper chest: Scarring at the lung apices. Other: None. IMPRESSION: Large acute right hemispheric subdural hematoma with subfalcine herniation and partial effacement of the basal cisterns. Probable early trapping of the left lateral ventricle. New right temporal hypoattenuation likely reflects subacute or chronic infarct. Chronic bifrontal encephalomalacia. No acute cervical spine fracture. Electronically Signed   By: Macy Mis M.D.   On: 08/19/2019 17:23    Microbiology Recent Results (from the past 240 hour(s))  SARS Coronavirus 2 by RT PCR (hospital order, performed in [facility] hospital lab) Nasopharyngeal Nasopharyngeal Swab     Status: None   Collection Time: 08/04/2019  8:07 PM   Specimen: Nasopharyngeal Swab  Result Value Ref Range Status   SARS Coronavirus 2 NEGATIVE NEGATIVE Final    Comment: (NOTE) SARS-CoV-2 target nucleic acids are NOT DETECTED. The SARS-CoV-2 RNA is generally detectable in upper and lower respiratory specimens during the acute phase of infection. The lowest concentration of SARS-CoV-2 viral copies this assay can detect is 250 copies / mL. A negative result does not preclude SARS-CoV-2 infection and should not be used as the sole basis for treatment or other patient management decisions.  A negative result may occur with improper specimen collection / handling, submission of specimen other than nasopharyngeal swab, presence of viral mutation(s) within the areas targeted by this assay, and inadequate number of viral copies (<250 copies / mL). A negative result must be combined with clinical observations, patient history, and epidemiological information. Fact Sheet for Patients:   StrictlyIdeas.no Fact Sheet for Healthcare  Providers: BankingDealers.co.za This test is not yet approved or cleared  by the [place] FDA and has been authorized for detection and/or diagnosis of SARS-CoV-2 by FDA under an Emergency Use Authorization (EUA).  This EUA will remain in effect (meaning this test can be used) for the duration of the COVID-19 declaration under Section 564(b)(1) of the Act, 21 U.S.C. section  360bbb-3(b)(1), unless the authorization is terminated or revoked sooner. Performed at [facility], [address] [place], [place] [address]     Lab Basic Metabolic Panel: Recent Labs  Lab 08/11/2019 1624 08/20/2019 1633  NA 132* 132*  K 4.4 4.5  CL 93* 96*  CO2 22  --   GLUCOSE 605* 612*  BUN 28* 31*  CREATININE 2.28* 2.00*  CALCIUM 9.6  --    Liver Function Tests: Recent Labs  Lab 07/28/2019 1624  AST 50*  ALT 30  ALKPHOS 95  BILITOT 0.6  PROT 8.0  ALBUMIN 3.9   No results for input(s): LIPASE, AMYLASE in the last 168 hours. No results for input(s): AMMONIA in the last 168 hours. CBC: Recent Labs  Lab 08/09/2019 1624 08/19/2019 1633  WBC 12.3*  --   NEUTROABS 6.5  --   HGB 12.9 14.3  HCT 38.8 42.0  MCV 104.6*  --   PLT 230  --    Cardiac Enzymes: No results for input(s): CKTOTAL, CKMB, CKMBINDEX, TROPONINI in the last 168 hours. Sepsis Labs: Recent Labs  Lab 07/27/2019 1624  WBC 12.3*     Joseph ArtJessica U Emberlie Gotcher 2019-09-11, 2:49 PM

## 2019-08-23 NOTE — Progress Notes (Signed)
Progress Note    Lisa [name]  WUJ:[id number]:[name] [name]: 02/10/1927  DOA: 08/02/2019 PCP: Kandyce RudBabaoff, Marcus, MD    Brief Narrative:     Medical records reviewed and are as summarized below:  Lisa SnellenFaye K [name] is an 85 y.o. female with medical history significant for atrial fibrillation on Eliquis, chronic systolic CHF, hypertension, hypothyroidism who presents to the ED for evaluation after a fall at home.  Patient is unresponsive therefore entirety of history is obtained from EDP, chart review, and family at bedside.  Patient was brought into the ED with altered mental status after suffering a fall at home with injury to her head.  Per ED documentation, EMS were called and on their arrival patient was awake, alert, and able to ambulate to the front door.  Soon after she became less responsive with speech changes.  She had last taken Eliquis around 8 AM.  Assessment/Plan:   Active Problems:   Subdural hematoma, acute (HCC)   Comfort measures only status   Traumatic large right-sided subdural hematoma Comfort care measures: Patient currently unresponsive, initially intubated in the ED for airway protection however after neurosurgery evaluation of CT imaging and discussion with family patient was extubated and changed to full comfort care measures as this was felt not to be a survivable condition.  Anticipate in-hospital death within the next 24 hours. -DO NOT RESUSCITATE -Continue IV pain meds, as needed Ativan, supportive care per end-of-life comfort care measures robinul    Family Communication/Anticipated D/C date and plan/Code Status   Son at bedside Comfort status    Subjective:   Here for end of life care  Objective:    Vitals:   07/31/2019 1945 08/10/2019 2000 08/09/2019 2103 14-Mar-2019 0814  BP: (!) 141/61 137/63 114/60 (!) 84/58  Pulse: 88 88 81 100  Resp: 14 14 16    Temp:   98.3 F (36.8 C) 97.9 F (36.6 C)  TempSrc:   Axillary   SpO2: 100% 100% 100%   Height:        No intake or output data in the 24 hours ending 14-Mar-2019 1122 There were no vitals filed for this visit.  Exam: In bed, wearing oxygen mask Upper airway sounds Appears mildly uncomfortable  Data Reviewed:   I have personally reviewed following labs and imaging studies:  Labs: Labs show the following:   Basic Metabolic Panel: Recent Labs  Lab 07/27/2019 1624 07/26/2019 1633  NA 132* 132*  K 4.4 4.5  CL 93* 96*  CO2 22  --   GLUCOSE 605* 612*  BUN 28* 31*  CREATININE 2.28* 2.00*  CALCIUM 9.6  --    GFR CrCl cannot be calculated (Unknown ideal weight.). Liver Function Tests: Recent Labs  Lab 07/29/2019 1624  AST 50*  ALT 30  ALKPHOS 95  BILITOT 0.6  PROT 8.0  ALBUMIN 3.9   No results for input(s): LIPASE, AMYLASE in the last 168 hours. No results for input(s): AMMONIA in the last 168 hours. Coagulation profile Recent Labs  Lab 08/03/2019 1624  INR 1.3*    CBC: Recent Labs  Lab 08/08/2019 1624 08/22/2019 1633  WBC 12.3*  --   NEUTROABS 6.5  --   HGB 12.9 14.3  HCT 38.8 42.0  MCV 104.6*  --   PLT 230  --    Cardiac Enzymes: No results for input(s): CKTOTAL, CKMB, CKMBINDEX, TROPONINI in the last 168 hours. BNP (last 3 results) No results for input(s): PROBNP in the last 8760 hours. CBG: Recent  Labs  Lab 09-Sep-2019 1637  GLUCAP 588*   D-Dimer: No results for input(s): DDIMER in the last 72 hours. Hgb A1c: No results for input(s): HGBA1C in the last 72 hours. Lipid Profile: No results for input(s): CHOL, HDL, LDLCALC, TRIG, CHOLHDL, LDLDIRECT in the last 72 hours. Thyroid function studies: No results for input(s): TSH, T4TOTAL, T3FREE, THYROIDAB in the last 72 hours.  Invalid input(s): FREET3 Anemia work up: No results for input(s): VITAMINB12, FOLATE, FERRITIN, TIBC, IRON, RETICCTPCT in the last 72 hours. Sepsis Labs: Recent Labs  Lab 2019/09/09 1624  WBC 12.3*    Microbiology Recent Results (from the past 240 hour(s))  SARS Coronavirus 2 by RT  PCR (hospital order, performed in [facility] hospital lab) Nasopharyngeal Nasopharyngeal Swab     Status: None   Collection Time: 09-Sep-2019  8:07 PM   Specimen: Nasopharyngeal Swab  Result Value Ref Range Status   SARS Coronavirus 2 NEGATIVE NEGATIVE Final    Comment: (NOTE) SARS-CoV-2 target nucleic acids are NOT DETECTED. The SARS-CoV-2 RNA is generally detectable in upper and lower respiratory specimens during the acute phase of infection. The lowest concentration of SARS-CoV-2 viral copies this assay can detect is 250 copies / mL. A negative result does not preclude SARS-CoV-2 infection and should not be used as the sole basis for treatment or other patient management decisions.  A negative result may occur with improper specimen collection / handling, submission of specimen other than nasopharyngeal swab, presence of viral mutation(s) within the areas targeted by this assay, and inadequate number of viral copies (<250 copies / mL). A negative result must be combined with clinical observations, patient history, and epidemiological information. Fact Sheet for Patients:   BoilerBrush.com.cy Fact Sheet for Healthcare Providers: https://pope.com/ This test is not yet approved or cleared  by the [place] FDA and has been authorized for detection and/or diagnosis of SARS-CoV-2 by FDA under an Emergency Use Authorization (EUA).  This EUA will remain in effect (meaning this test can be used) for the duration of the COVID-19 declaration under Section 564(b)(1) of the Act, 21 U.S.C. section 360bbb-3(b)(1), unless the authorization is terminated or revoked sooner. Performed at [facility], [address], [place], [place] [address]     Procedures and diagnostic studies:  Ct Cervical Spine Wo Contrast  Result Date: September 09, 2019 CLINICAL DATA:  Code stroke.  Fall EXAM: CT HEAD WITHOUT CONTRAST CT CERVICAL SPINE WITHOUT CONTRAST  TECHNIQUE: Multidetector CT imaging of the head and cervical spine was performed following the standard protocol without intravenous contrast. Multiplanar CT image reconstructions of the cervical spine were also generated. COMPARISON:  2017 FINDINGS: CT HEAD FINDINGS Brain: There is a large mixed density but primarily hyperattenuating subdural hematoma along the right cerebral convexity. This measures up to 3.5 cm in maximal thickness along the frontal lobe. Small volume subdural blood is also present along the falx. Subfalcine herniation is present with leftward midline shift measuring 1.2 cm. There is likely early trapping of the left lateral ventricle. Partial effacement of the suprasellar and perimesencephalic cisterns. Chronic bifrontal encephalomalacia anteroinferiorly. There is new hypoattenuation with loss of gray-white differentiation in the right temporal lobe. Vascular: There is intracranial atherosclerotic calcification at the skull base. Skull: Calvarium is intact. Sinuses/Orbits: Aerated.  No acute orbital abnormality. Other: Mastoid air cells are clear. Left occipital scalp soft tissue swelling and laceration. CT CERVICAL SPINE FINDINGS Alignment: Nonspecific straightening of the cervical lordosis. Trace anterolisthesis at C7-T1. Skull base and vertebrae: Vertebral body heights are  maintained apart from degenerative endplate irregularity. There is no acute fracture. Soft tissues and spinal canal: Spinal canal is poorly evaluated due to artifact. Disc levels: Advanced multilevel degenerative changes including disc height loss, endplate osteophytes, and facet and uncovertebral joint hypertrophy. Upper chest: Scarring at the lung apices. Other: None. IMPRESSION: Large acute right hemispheric subdural hematoma with subfalcine herniation and partial effacement of the basal cisterns. Probable early trapping of the left lateral ventricle. New right temporal hypoattenuation likely reflects subacute or chronic  infarct. Chronic bifrontal encephalomalacia. No acute cervical spine fracture. Electronically Signed   By: Macy Mis M.D.   On: September 08, 2019 17:23   Dg Chest Portable 1 View  Result Date: 2019-09-08 CLINICAL DATA:  Intubation. EXAM: PORTABLE CHEST 1 VIEW COMPARISON:  Chest x-ray 01/15/2018. FINDINGS: Endotracheal tube tip 1.8 cm above the lower portion of the carina. Proximal repositioning of approximately 2 cm should be considered. Low lung volumes with bibasilar atelectasis. Mild left base infiltrate cannot be excluded. Small left pleural effusion cannot be excluded. Biapical pleural thickening noted consistent scarring. No pneumothorax. Cardiomegaly. No pulmonary venous congestion. No acute bony abnormality. IMPRESSION: 1. Endotracheal tube tip 1.8 cm above the lower portion of the carina. Proximal repositioning of approximately 2 cm should be considered. 2. Low lung volumes with bibasilar atelectasis. Mild left base infiltrate cannot be excluded. Small left pleural effusion cannot be excluded. Electronically Signed   By: Marcello Moores  Register   On: September 08, 2019 17:31   Ct Head Code Stroke Wo Contrast  Result Date: 09-08-19 CLINICAL DATA:  Code stroke.  Fall EXAM: CT HEAD WITHOUT CONTRAST CT CERVICAL SPINE WITHOUT CONTRAST TECHNIQUE: Multidetector CT imaging of the head and cervical spine was performed following the standard protocol without intravenous contrast. Multiplanar CT image reconstructions of the cervical spine were also generated. COMPARISON:  2017 FINDINGS: CT HEAD FINDINGS Brain: There is a large mixed density but primarily hyperattenuating subdural hematoma along the right cerebral convexity. This measures up to 3.5 cm in maximal thickness along the frontal lobe. Small volume subdural blood is also present along the falx. Subfalcine herniation is present with leftward midline shift measuring 1.2 cm. There is likely early trapping of the left lateral ventricle. Partial effacement of the  suprasellar and perimesencephalic cisterns. Chronic bifrontal encephalomalacia anteroinferiorly. There is new hypoattenuation with loss of gray-white differentiation in the right temporal lobe. Vascular: There is intracranial atherosclerotic calcification at the skull base. Skull: Calvarium is intact. Sinuses/Orbits: Aerated.  No acute orbital abnormality. Other: Mastoid air cells are clear. Left occipital scalp soft tissue swelling and laceration. CT CERVICAL SPINE FINDINGS Alignment: Nonspecific straightening of the cervical lordosis. Trace anterolisthesis at C7-T1. Skull base and vertebrae: Vertebral body heights are maintained apart from degenerative endplate irregularity. There is no acute fracture. Soft tissues and spinal canal: Spinal canal is poorly evaluated due to artifact. Disc levels: Advanced multilevel degenerative changes including disc height loss, endplate osteophytes, and facet and uncovertebral joint hypertrophy. Upper chest: Scarring at the lung apices. Other: None. IMPRESSION: Large acute right hemispheric subdural hematoma with subfalcine herniation and partial effacement of the basal cisterns. Probable early trapping of the left lateral ventricle. New right temporal hypoattenuation likely reflects subacute or chronic infarct. Chronic bifrontal encephalomalacia. No acute cervical spine fracture. Electronically Signed   By: Macy Mis M.D.   On: 09/08/2019 17:23    Medications:   . sodium chloride flush  3 mL Intravenous Once   Continuous Infusions: . chlorproMAZINE (THORAZINE) IV    . morphine 5 mg/hr (  09-10-19 1758)     LOS: 0 days   Joseph Art  [facility]   How to contact the [facility] Attending or Consulting provider 7A - 7P or covering provider during after hours 7P -7A, for this patient?  1. Check the care team in [facility] and look for a) attending/consulting [facility] provider listed and b) the [facility] team listed 2. Log into [email or web address] and use [place]'s universal  password to access. If you do not have the password, please contact the hospital operator. 3. Locate the [facility] provider you are looking for under [facility] and page to a number that you can be directly reached. 4. If you still have difficulty reaching the provider, please page the [facility] (Director on Call) for the Hospitalists listed on amion for assistance.  08/08/2019, 11:22 AM

## 2019-08-23 DEATH — deceased

## 2019-10-12 ENCOUNTER — Ambulatory Visit: Payer: Medicare Other | Admitting: Physical Medicine & Rehabilitation
